# Patient Record
Sex: Female | Born: 1960
Health system: Southern US, Community
[De-identification: ages and names within clinical notes are randomized; demographics above are authoritative.]

## PROBLEM LIST (undated history)

## (undated) DIAGNOSIS — J45909 Unspecified asthma, uncomplicated: Secondary | ICD-10-CM

## (undated) DIAGNOSIS — R52 Pain, unspecified: Secondary | ICD-10-CM

## (undated) DIAGNOSIS — I639 Cerebral infarction, unspecified: Secondary | ICD-10-CM

## (undated) DIAGNOSIS — I10 Essential (primary) hypertension: Secondary | ICD-10-CM

## (undated) DIAGNOSIS — I2510 Atherosclerotic heart disease of native coronary artery without angina pectoris: Secondary | ICD-10-CM

## (undated) DIAGNOSIS — E119 Type 2 diabetes mellitus without complications: Secondary | ICD-10-CM

## (undated) DIAGNOSIS — F329 Major depressive disorder, single episode, unspecified: Secondary | ICD-10-CM

## (undated) DIAGNOSIS — K219 Gastro-esophageal reflux disease without esophagitis: Secondary | ICD-10-CM

## (undated) DIAGNOSIS — Z8719 Personal history of other diseases of the digestive system: Secondary | ICD-10-CM

## (undated) DIAGNOSIS — E041 Nontoxic single thyroid nodule: Secondary | ICD-10-CM

## (undated) DIAGNOSIS — F32A Depression, unspecified: Secondary | ICD-10-CM

## (undated) HISTORY — PX: EYE SURGERY: SHX253

## (undated) HISTORY — PX: ABDOMINAL HYSTERECTOMY: SHX81

## (undated) HISTORY — PX: CARDIAC SURGERY: SHX584

---

## 2005-04-28 ENCOUNTER — Ambulatory Visit: Payer: Self-pay | Admitting: Unknown Physician Specialty

## 2005-05-01 ENCOUNTER — Other Ambulatory Visit: Payer: Self-pay

## 2005-05-01 ENCOUNTER — Inpatient Hospital Stay: Payer: Self-pay | Admitting: Internal Medicine

## 2005-05-09 ENCOUNTER — Ambulatory Visit: Payer: Self-pay | Admitting: Unknown Physician Specialty

## 2005-06-09 ENCOUNTER — Emergency Department: Payer: Self-pay | Admitting: Internal Medicine

## 2005-09-15 ENCOUNTER — Other Ambulatory Visit: Payer: Self-pay

## 2005-09-15 ENCOUNTER — Emergency Department: Payer: Self-pay | Admitting: Emergency Medicine

## 2005-12-19 ENCOUNTER — Emergency Department: Payer: Self-pay | Admitting: Emergency Medicine

## 2006-04-20 ENCOUNTER — Emergency Department: Payer: Self-pay | Admitting: Emergency Medicine

## 2006-06-21 ENCOUNTER — Inpatient Hospital Stay: Payer: Self-pay | Admitting: Internal Medicine

## 2006-10-05 ENCOUNTER — Emergency Department: Payer: Self-pay | Admitting: Emergency Medicine

## 2007-03-22 ENCOUNTER — Emergency Department: Payer: Self-pay | Admitting: Emergency Medicine

## 2007-03-22 ENCOUNTER — Other Ambulatory Visit: Payer: Self-pay

## 2007-10-25 ENCOUNTER — Ambulatory Visit: Payer: Self-pay | Admitting: Family Medicine

## 2007-11-27 ENCOUNTER — Observation Stay: Payer: Self-pay | Admitting: Internal Medicine

## 2007-11-27 ENCOUNTER — Other Ambulatory Visit: Payer: Self-pay

## 2008-05-24 ENCOUNTER — Other Ambulatory Visit: Payer: Self-pay

## 2008-05-24 ENCOUNTER — Emergency Department: Payer: Self-pay | Admitting: Emergency Medicine

## 2008-09-10 ENCOUNTER — Ambulatory Visit: Payer: Self-pay

## 2009-04-13 ENCOUNTER — Inpatient Hospital Stay: Payer: Self-pay | Admitting: Internal Medicine

## 2009-06-04 ENCOUNTER — Ambulatory Visit: Payer: Self-pay | Admitting: Family Medicine

## 2010-02-25 ENCOUNTER — Emergency Department: Payer: Self-pay | Admitting: Emergency Medicine

## 2010-04-07 ENCOUNTER — Ambulatory Visit: Payer: Self-pay

## 2010-06-02 ENCOUNTER — Emergency Department: Payer: Self-pay | Admitting: Emergency Medicine

## 2010-07-01 ENCOUNTER — Emergency Department: Payer: Self-pay | Admitting: Emergency Medicine

## 2010-07-16 ENCOUNTER — Ambulatory Visit: Payer: Self-pay | Admitting: Internal Medicine

## 2011-04-20 ENCOUNTER — Inpatient Hospital Stay: Payer: Self-pay | Admitting: Internal Medicine

## 2011-04-21 DIAGNOSIS — I517 Cardiomegaly: Secondary | ICD-10-CM

## 2011-04-21 DIAGNOSIS — R079 Chest pain, unspecified: Secondary | ICD-10-CM

## 2011-05-16 ENCOUNTER — Emergency Department: Payer: Self-pay | Admitting: Emergency Medicine

## 2011-06-29 ENCOUNTER — Inpatient Hospital Stay: Payer: Self-pay | Admitting: Internal Medicine

## 2011-07-22 ENCOUNTER — Emergency Department: Payer: Self-pay | Admitting: *Deleted

## 2011-11-01 ENCOUNTER — Ambulatory Visit: Payer: Self-pay | Admitting: Family Medicine

## 2012-01-03 ENCOUNTER — Ambulatory Visit: Payer: Self-pay | Admitting: Family Medicine

## 2012-06-10 ENCOUNTER — Emergency Department: Payer: Self-pay | Admitting: *Deleted

## 2012-06-10 LAB — COMPREHENSIVE METABOLIC PANEL
Calcium, Total: 8.8 mg/dL (ref 8.5–10.1)
Chloride: 101 mmol/L (ref 98–107)
Co2: 29 mmol/L (ref 21–32)
Creatinine: 1.35 mg/dL — ABNORMAL HIGH (ref 0.60–1.30)
EGFR (African American): 53 — ABNORMAL LOW
EGFR (Non-African Amer.): 45 — ABNORMAL LOW
Glucose: 159 mg/dL — ABNORMAL HIGH (ref 65–99)
Osmolality: 281 (ref 275–301)
Potassium: 3.6 mmol/L (ref 3.5–5.1)
SGOT(AST): 25 U/L (ref 15–37)
Sodium: 140 mmol/L (ref 136–145)

## 2012-06-10 LAB — URINALYSIS, COMPLETE
Bilirubin,UR: NEGATIVE
Glucose,UR: NEGATIVE mg/dL (ref 0–75)
Specific Gravity: 1.004 (ref 1.003–1.030)
Squamous Epithelial: 4
WBC UR: 28 /HPF (ref 0–5)

## 2012-06-10 LAB — CBC WITH DIFFERENTIAL/PLATELET
Basophil #: 0 10*3/uL (ref 0.0–0.1)
Basophil %: 0.7 %
Eosinophil #: 0.1 10*3/uL (ref 0.0–0.7)
HCT: 38.5 % (ref 35.0–47.0)
HGB: 12.7 g/dL (ref 12.0–16.0)
Lymphocyte #: 2.1 10*3/uL (ref 1.0–3.6)
MCH: 26.7 pg (ref 26.0–34.0)
MCHC: 33 g/dL (ref 32.0–36.0)
Monocyte #: 0.4 x10 3/mm (ref 0.2–0.9)
Monocyte %: 7.1 %
RBC: 4.76 10*6/uL (ref 3.80–5.20)
RDW: 14 % (ref 11.5–14.5)

## 2012-06-10 LAB — LIPASE, BLOOD: Lipase: 128 U/L (ref 73–393)

## 2012-10-17 ENCOUNTER — Ambulatory Visit: Payer: Self-pay | Admitting: Ophthalmology

## 2012-10-17 DIAGNOSIS — I10 Essential (primary) hypertension: Secondary | ICD-10-CM

## 2012-10-17 LAB — HEMOGLOBIN: HGB: 12.2 g/dL

## 2012-10-17 LAB — POTASSIUM: Potassium: 3.6 mmol/L

## 2012-10-29 ENCOUNTER — Ambulatory Visit: Payer: Self-pay | Admitting: Ophthalmology

## 2013-02-08 ENCOUNTER — Ambulatory Visit: Payer: Self-pay | Admitting: Family Medicine

## 2013-09-13 DIAGNOSIS — R079 Chest pain, unspecified: Secondary | ICD-10-CM | POA: Insufficient documentation

## 2013-09-13 DIAGNOSIS — E782 Mixed hyperlipidemia: Secondary | ICD-10-CM | POA: Insufficient documentation

## 2013-09-13 DIAGNOSIS — K219 Gastro-esophageal reflux disease without esophagitis: Secondary | ICD-10-CM | POA: Insufficient documentation

## 2013-09-13 DIAGNOSIS — J45909 Unspecified asthma, uncomplicated: Secondary | ICD-10-CM | POA: Insufficient documentation

## 2013-09-13 DIAGNOSIS — I69359 Hemiplegia and hemiparesis following cerebral infarction affecting unspecified side: Secondary | ICD-10-CM | POA: Insufficient documentation

## 2013-09-13 HISTORY — DX: Chest pain, unspecified: R07.9

## 2014-02-20 DIAGNOSIS — D126 Benign neoplasm of colon, unspecified: Secondary | ICD-10-CM

## 2014-02-20 HISTORY — DX: Benign neoplasm of colon, unspecified: D12.6

## 2014-09-23 DIAGNOSIS — I10 Essential (primary) hypertension: Secondary | ICD-10-CM | POA: Insufficient documentation

## 2015-03-31 NOTE — Op Note (Signed)
PATIENT NAME:  Kayla Malone, Kayla Malone MR#:  [id number] DATE OF BIRTH:  07/01/1961  DATE OF PROCEDURE:  10/29/2012  PREOPERATIVE DIAGNOSIS: Visually significant cataract of the right eye.   POSTOPERATIVE DIAGNOSIS: Visually significant cataract of the right eye.   OPERATIVE PROCEDURE: Cataract extraction by phacoemulsification with implant of intraocular lens to the right eye.   SURGEON: Kayla Robson, MD  ANESTHESIA:  1. Managed anesthesia care.  2. 50-50 mixture of 0.75% bupivacaine and 4% Xylocaine given as a retrobulbar block.   COMPLICATIONS: None.   TECHNIQUE:  Stop-and-chop    DESCRIPTION OF PROCEDURE: The patient was examined and consented for this procedure in the preoperative holding area and then brought back to the Operating Room where the anesthesia team employed managed anesthesia care.  3.5 milliliters of the aforementioned mixture were placed in the right orbit on an Atkinson needle without complication. The right eye was then prepped and draped in the usual sterile ophthalmic fashion. A lid speculum was placed. The side-port blade was used to create a paracentesis and the anterior chamber was filled with viscoelastic. The keratome was used to create a near clear corneal incision. The continuous curvilinear capsulorrhexis was performed with a cystotome followed by the capsulorrhexis forceps. Hydrodissection and hydrodelineation were carried out with BSS on a blunt cannula. The lens was removed in a stop-and-chop technique. The remaining cortical material was removed with the irrigation-aspiration handpiece. The capsular bag was inflated with viscoelastic and the Tecnis ZCB00 24.0-diopter lens, serial number [id number] was placed in the capsular bag without complication. The remaining viscoelastic was removed from the eye with the irrigation-aspiration handpiece. The wounds were hydrated. The anterior chamber was flushed with Miostat. The eye was inflated to a physiologic pressure  and the wounds were found to be watertight.   The eye was dressed with Vigamox followed by Maxitrol ointment and a protective shield was placed. The patient will follow-up with me in one day.   ____________________________ Kayla Malone. Chanetta Moosman, MD wlp:drc D: 10/29/2012 17:33:21 ET T: 10/30/2012 08:20:05 ET JOB#: [id number]  cc: Romuald Mccaslin L. Carrah Eppolito, MD, <Dictator> Kayla Malone Lynda Capistran MD ELECTRONICALLY SIGNED 11/15/2012 15:43

## 2016-05-03 ENCOUNTER — Encounter: Payer: Self-pay | Admitting: *Deleted

## 2016-05-03 ENCOUNTER — Emergency Department: Payer: Medicare Other

## 2016-05-03 ENCOUNTER — Emergency Department
Admission: EM | Admit: 2016-05-03 | Discharge: 2016-05-03 | Disposition: A | Discharge status: Home / Self Care | Payer: Medicare Other | Attending: Emergency Medicine | Admitting: Emergency Medicine

## 2016-05-03 DIAGNOSIS — I10 Essential (primary) hypertension: Secondary | ICD-10-CM | POA: Insufficient documentation

## 2016-05-03 DIAGNOSIS — J45909 Unspecified asthma, uncomplicated: Secondary | ICD-10-CM | POA: Diagnosis not present

## 2016-05-03 DIAGNOSIS — E119 Type 2 diabetes mellitus without complications: Secondary | ICD-10-CM | POA: Diagnosis not present

## 2016-05-03 DIAGNOSIS — R1032 Left lower quadrant pain: Secondary | ICD-10-CM | POA: Insufficient documentation

## 2016-05-03 HISTORY — DX: Essential (primary) hypertension: I10

## 2016-05-03 HISTORY — DX: Type 2 diabetes mellitus without complications: E11.9

## 2016-05-03 HISTORY — DX: Unspecified asthma, uncomplicated: J45.909

## 2016-05-03 LAB — COMPREHENSIVE METABOLIC PANEL
ALBUMIN: 4.3 g/dL (ref 3.5–5.0)
ALT: 15 U/L (ref 14–54)
AST: 22 U/L (ref 15–41)
Alkaline Phosphatase: 91 U/L (ref 38–126)
Anion gap: 6 (ref 5–15)
BUN: 10 mg/dL (ref 6–20)
CHLORIDE: 103 mmol/L (ref 101–111)
CO2: 29 mmol/L (ref 22–32)
Calcium: 9.3 mg/dL (ref 8.9–10.3)
Creatinine, Ser: 0.89 mg/dL (ref 0.44–1.00)
GFR calc non Af Amer: >60 mL/min (ref >60)
Glucose, Bld: 114 mg/dL — ABNORMAL HIGH (ref 65–99)
Potassium: 3.6 mmol/L (ref 3.5–5.1)
Sodium: 138 mmol/L (ref 135–145)
Total Bilirubin: 0.5 mg/dL (ref 0.3–1.2)
Total Protein: 7.9 g/dL (ref 6.5–8.1)

## 2016-05-03 LAB — CBC
HEMATOCRIT: 40.2 % (ref 35.0–47.0)
HEMOGLOBIN: 13.1 g/dL (ref 12.0–16.0)
MCH: 27 pg (ref 26.0–34.0)
MCHC: 32.5 g/dL (ref 32.0–36.0)
MCV: 83.1 fL (ref 80.0–100.0)
Platelets: 324 10*3/uL (ref 150–440)
RBC: 4.84 MIL/uL (ref 3.80–5.20)
RDW: 13.9 % (ref 11.5–14.5)
WBC: 5.9 10*3/uL (ref 3.6–11.0)

## 2016-05-03 LAB — URINALYSIS COMPLETE WITH MICROSCOPIC (ARMC ONLY)
BACTERIA UA: NONE SEEN
Bilirubin Urine: NEGATIVE
Glucose, UA: NEGATIVE mg/dL
Ketones, ur: NEGATIVE mg/dL
Nitrite: NEGATIVE
PH: 5 (ref 5.0–8.0)
PROTEIN: NEGATIVE mg/dL
Specific Gravity, Urine: 1.011 (ref 1.005–1.030)

## 2016-05-03 LAB — LIPASE, BLOOD: Lipase: 42 U/L (ref 11–51)

## 2016-05-03 MED ORDER — DIATRIZOATE MEGLUMINE & SODIUM 66-10 % PO SOLN
15.0000 mL | Freq: Once | ORAL | Status: AC
  Administered 2016-05-03: 15 mL via ORAL

## 2016-05-03 MED ORDER — IOPAMIDOL (ISOVUE-300) INJECTION 61%
100.0000 mL | Freq: Once | INTRAVENOUS | Status: AC | PRN
  Administered 2016-05-03: 100 mL via INTRAVENOUS

## 2016-05-03 MED ORDER — HYDROCODONE-ACETAMINOPHEN 5-325 MG PO TABS: 1.0000 | ORAL_TABLET | ORAL | Status: DC | PRN

## 2016-05-03 MED ORDER — FENTANYL CITRATE (PF) 100 MCG/2ML IJ SOLN
50.0000 ug | Freq: Once | INTRAMUSCULAR | Status: AC
  Administered 2016-05-03: 50 ug via INTRAVENOUS
  Filled 2016-05-03: qty 2

## 2016-05-03 NOTE — Discharge Instructions (Signed)

## 2016-05-03 NOTE — ED Provider Notes (Addendum)
[facility] Emergency Department Provider Note  ____________________________________________   I have reviewed the triage vital signs and the nursing notes.   HISTORY  Chief Complaint Abdominal Pain    HPI Kayla Malone is a 55 y.o. female with a history of diabetes , denies surgical history of her abdomen, states that she has left lower quadrant pain for the last 2-3 days. No fever no vomiting no other symptoms. His never had diverticulitis per does have a history of kidney stones in the past but has had no hematuria dysuria or urinary frequency or flank pain. Pain began gradually. Nothing makes it better nothing makes it worse. There are no other associated symptoms. She has not had any prior treatment. Has not taken very much she states in the way of over-the-counter pain medication. Patient does state the tramadol made her sleepy once but she has never had an anaphylactic reaction to pain medications. She is driving but does request pain meds she understands that someone must come pick her up and she receives narcotic pain medication.     Past Medical History  Diagnosis Date  . Diabetes mellitus without complication ([place])   . Hypertension     There are no active problems to display for this patient.   History reviewed. No pertinent past surgical history.  No current outpatient prescriptions on file.  Allergies Tramadol  No family history on file.  Social History Social History  Substance Use Topics  . Smoking status: Never Smoker   . Smokeless tobacco: None  . Alcohol Use: No    Review of Systems Constitutional: No fever/chills Eyes: No visual changes. ENT: No sore throat. No stiff neck no neck pain Cardiovascular: Denies chest pain. Respiratory: Denies shortness of breath. Gastrointestinal:   no vomiting.  No diarrhea.  No constipation. Genitourinary: Negative for dysuria. Musculoskeletal: Negative lower extremity  swelling Skin: Negative for rash. Neurological: Negative for headaches, focal weakness or numbness. 10-point ROS otherwise negative.  ____________________________________________   PHYSICAL EXAM:  VITAL SIGNS: ED Triage Vitals  Enc Vitals Group     BP 05/03/16 1749 153/74 mmHg     Pulse Rate 05/03/16 1749 65     Resp 05/03/16 1749 20     Temp 05/03/16 1749 98.2 F (36.8 C)     Temp Source 05/03/16 1749 Oral     SpO2 05/03/16 1749 100 %     Weight 05/03/16 1749 150 lb (68.04 kg)     Height 05/03/16 1749 5\' 3"  (1.6 m)     Head Cir --      Peak Flow --      Pain Score 05/03/16 1749 8     Pain Loc --      Pain Edu? --      Excl. in [place]? --     Constitutional: Alert and oriented. Well appearing and in no acute distress. Eyes: Conjunctivae are normal. PERRL. EOMI. Head: Atraumatic. Nose: No congestion/rhinnorhea. Mouth/Throat: Mucous membranes are moist.  Oropharynx non-erythematous. Neck: No stridor.   Nontender with no meningismus Cardiovascular: Normal rate, regular rhythm. Grossly normal heart sounds.  Good peripheral circulation. Respiratory: Normal respiratory effort.  No retractions. Lungs CTAB. Abdominal: Soft Positive tenderness to palpation left lower quadrant voluntary guarding no rebound No distention.  Back:  There is no focal tenderness or step off there is no midline tenderness there are no lesions noted. there is no CVA tenderness Musculoskeletal: No lower extremity tenderness. No joint effusions, no DVT signs strong distal pulses  no edema Neurologic:  Normal speech and language. No gross focal neurologic deficits are appreciated.  Skin:  Skin is warm, dry and intact. No rash noted. Psychiatric: Mood and affect are normal. Speech and behavior are normal.  ____________________________________________   LABS (all labs ordered are listed, but only abnormal results are displayed)  Labs Reviewed  COMPREHENSIVE METABOLIC PANEL - Abnormal; Notable for the  following:    Glucose, Bld 114 (*)    All other components within normal limits  URINALYSIS COMPLETEWITH MICROSCOPIC ([facility] ONLY) - Abnormal; Notable for the following:    Color, Urine YELLOW (*)    APPearance CLEAR (*)    Hgb urine dipstick 2+ (*)    Leukocytes, UA TRACE (*)    Squamous Epithelial / LPF 0-5 (*)    All other components within normal limits  LIPASE, BLOOD  CBC   ____________________________________________  EKG  I personally interpreted any EKGs ordered by me or triage  ____________________________________________  RADIOLOGY  I reviewed any imaging ordered by me or triage that were performed during my shift and, if possible, patient and/or family made aware of any abnormal findings. ____________________________________________   PROCEDURES  Procedure(s) performed: None  Critical Care performed: None  ____________________________________________   INITIAL IMPRESSION / ASSESSMENT AND PLAN / ED COURSE  Pertinent labs & imaging results that were available during my care of the patient were reviewed by me and considered in my medical decision making (see chart for details).  Left lower quadrant pain concerning for possible diverticular pathology will obtain CT and reassess. Patient in no acute distress.  ----------------------------------------- 8:49 PM on 05/03/2016 -----------------------------------------  Signed out to Dr Kerman Passey at the end of my shift ____________________________________________   FINAL CLINICAL IMPRESSION(S) / ED DIAGNOSES  Final diagnoses:  None      This chart was dictated using voice recognition software.  Despite best efforts to proofread,  errors can occur which can change meaning.     Schuyler Amor, MD 05/03/16 [place], MD 05/03/16 [phone]

## 2016-05-03 NOTE — ED Provider Notes (Signed)
-----------------------------------------   9:53 PM on 05/03/2016 -----------------------------------------  Patient CT scan has resulted normal. No signs of diverticulitis. Labs including white blood cell count are negative. Urinalysis normal. We'll discharge the patient with a short course of pain medication and have her follow up with her primary care doctor is [place] [facility]. Discussed strict return precautions, the patient is agreeable.  Harvest Dark, MD 05/03/16 2154

## 2016-05-03 NOTE — ED Notes (Signed)
Pt complains of left lower quad pain below the umbilicus, pt also reports a sore throat

## 2016-05-20 ENCOUNTER — Emergency Department
Admission: EM | Admit: 2016-05-20 | Discharge: 2016-05-20 | Disposition: A | Discharge status: Home / Self Care | Payer: Medicare Other | Attending: Emergency Medicine | Admitting: Emergency Medicine

## 2016-05-20 ENCOUNTER — Emergency Department: Payer: Medicare Other

## 2016-05-20 DIAGNOSIS — R079 Chest pain, unspecified: Secondary | ICD-10-CM

## 2016-05-20 DIAGNOSIS — M79662 Pain in left lower leg: Secondary | ICD-10-CM | POA: Diagnosis present

## 2016-05-20 DIAGNOSIS — J45909 Unspecified asthma, uncomplicated: Secondary | ICD-10-CM | POA: Insufficient documentation

## 2016-05-20 DIAGNOSIS — R0789 Other chest pain: Secondary | ICD-10-CM | POA: Diagnosis not present

## 2016-05-20 DIAGNOSIS — M79605 Pain in left leg: Secondary | ICD-10-CM

## 2016-05-20 DIAGNOSIS — I10 Essential (primary) hypertension: Secondary | ICD-10-CM | POA: Insufficient documentation

## 2016-05-20 DIAGNOSIS — E119 Type 2 diabetes mellitus without complications: Secondary | ICD-10-CM | POA: Diagnosis not present

## 2016-05-20 DIAGNOSIS — Z8673 Personal history of transient ischemic attack (TIA), and cerebral infarction without residual deficits: Secondary | ICD-10-CM | POA: Insufficient documentation

## 2016-05-20 HISTORY — DX: Cerebral infarction, unspecified: I63.9

## 2016-05-20 LAB — CBC
HEMATOCRIT: 39.2 % (ref 35.0–47.0)
HEMOGLOBIN: 13.1 g/dL (ref 12.0–16.0)
MCH: 27 pg (ref 26.0–34.0)
MCHC: 33.4 g/dL (ref 32.0–36.0)
MCV: 81 fL (ref 80.0–100.0)
Platelets: 334 10*3/uL (ref 150–440)
RBC: 4.84 MIL/uL (ref 3.80–5.20)
RDW: 14.2 % (ref 11.5–14.5)
WBC: 5.4 10*3/uL (ref 3.6–11.0)

## 2016-05-20 LAB — COMPREHENSIVE METABOLIC PANEL
ALK PHOS: 82 U/L (ref 38–126)
ALT: 12 U/L — AB (ref 14–54)
AST: 18 U/L (ref 15–41)
Albumin: 4.2 g/dL (ref 3.5–5.0)
Anion gap: 7 (ref 5–15)
BILIRUBIN TOTAL: 0.9 mg/dL (ref 0.3–1.2)
BUN: 10 mg/dL (ref 6–20)
CALCIUM: 9 mg/dL (ref 8.9–10.3)
CO2: 27 mmol/L (ref 22–32)
CREATININE: 0.74 mg/dL (ref 0.44–1.00)
Chloride: 103 mmol/L (ref 101–111)
GFR calc Af Amer: >60 mL/min (ref >60)
Glucose, Bld: 161 mg/dL — ABNORMAL HIGH (ref 65–99)
Potassium: 3.8 mmol/L (ref 3.5–5.1)
Sodium: 137 mmol/L (ref 135–145)
TOTAL PROTEIN: 7.6 g/dL (ref 6.5–8.1)

## 2016-05-20 LAB — CK: Total CK: 82 U/L (ref 38–234)

## 2016-05-20 LAB — TROPONIN I: Troponin I: <0.03 ng/mL (ref <0.031)

## 2016-05-20 MED ORDER — OXYCODONE-ACETAMINOPHEN 5-325 MG PO TABS
1.0000 | ORAL_TABLET | Freq: Once | ORAL | Status: AC
  Administered 2016-05-20: 1 via ORAL
  Filled 2016-05-20: qty 1

## 2016-05-20 NOTE — ED Provider Notes (Signed)
[facility] Emergency Department Provider Note   ____________________________________________  Time seen: Approximately [id number] AM  I have reviewed the triage vital signs and the nursing notes.   HISTORY  Chief Complaint Leg Pain    HPI Kayla Malone is a 55 y.o. female who comes into the hospital today with some pains in her left leg. She reports that it started in her lower leg. She took some Tylenol and aspirin but the pain seemed to moving up her leg. She reports that she feels some tenderness. Shereports though that now she has some chest tightness. She reports that it started recently but she is unable to give her a specific time. She reports it is not hurting as much as it did when she first started. The patient reports that the leg pain woke her up out of sleep. She reports that the pain seems to come and go and is better with walking. The patient rates her pain 8 out of 10 in intensity   Past Medical History  Diagnosis Date  . Diabetes mellitus without complication ([place])   . Hypertension   . Asthma   . Stroke [facility])     x3    There are no active problems to display for this patient.   History reviewed. No pertinent past surgical history.  Current Outpatient Rx  Name  Route  Sig  Dispense  Refill  . HYDROcodone-acetaminophen (NORCO/VICODIN) 5-325 MG tablet   Oral   Take 1 tablet by mouth every 4 (four) hours as needed.   15 tablet   0     Allergies Tramadol  No family history on file.  Social History Social History  Substance Use Topics  . Smoking status: Never Smoker   . Smokeless tobacco: None  . Alcohol Use: No    Review of Systems Constitutional: No fever/chills Eyes: No visual changes. ENT: No sore throat. Cardiovascular: chest pain. Respiratory: Denies shortness of breath. Gastrointestinal: No abdominal pain.  No nausea, no vomiting.  No diarrhea.  No constipation. Genitourinary: Negative for  dysuria. Musculoskeletal: left leg pain. Skin: Negative for rash. Neurological: Negative for headaches, focal weakness or numbness.  10-point ROS otherwise negative.  ____________________________________________   PHYSICAL EXAM:  VITAL SIGNS: ED Triage Vitals  Enc Vitals Group     BP 05/20/16 0357 151/99 mmHg     Pulse Rate 05/20/16 0357 68     Resp 05/20/16 0357 18     Temp 05/20/16 0357 98.2 F (36.8 C)     Temp Source 05/20/16 0357 Oral     SpO2 05/20/16 0357 98 %     Weight 05/20/16 0357 145 lb (65.772 kg)     Height 05/20/16 0357 5\' 3"  (1.6 m)     Head Cir --      Peak Flow --      Pain Score 05/20/16 0358 8     Pain Loc --      Pain Edu? --      Excl. in [place]? --     Constitutional: Alert and oriented. Well appearing and in mild distress. Eyes: Conjunctivae are normal. PERRL. EOMI. Head: Atraumatic. Nose: No congestion/rhinnorhea. Mouth/Throat: Mucous membranes are moist.  Oropharynx non-erythematous. Cardiovascular: Normal rate, regular rhythm. Grossly normal heart sounds.  Good peripheral circulation. Respiratory: Normal respiratory effort.  No retractions. Lungs CTAB. Gastrointestinal: Soft and nontender. No distention. Positive bowel sounds Musculoskeletal: No lower extremity tenderness nor edema.   Neurologic:  Normal speech and language.  Skin:  Skin is warm, dry and intact. Psychiatric: Mood and affect are normal.   ____________________________________________   LABS (all labs ordered are listed, but only abnormal results are displayed)  Labs Reviewed  COMPREHENSIVE METABOLIC PANEL - Abnormal; Notable for the following:    Glucose, Bld 161 (*)    ALT 12 (*)    All other components within normal limits  CBC  TROPONIN I  CK  TROPONIN I   ____________________________________________  EKG  ED ECG REPORT I, Loney Hering, the attending physician, personally viewed and interpreted this ECG.   Date: 05/20/2016  EKG Time: 615  Rate: 66   Rhythm: normal sinus rhythm  Axis: normal  Intervals:none  ST&T Change: none  ____________________________________________  RADIOLOGY  CXR: No active cardiopulmonary disease  [place] LLE doppler: No evidence of left lower extremity deep venous thrombosis ____________________________________________   PROCEDURES  Procedure(s) performed: None  Critical Care performed: No  ____________________________________________   INITIAL IMPRESSION / ASSESSMENT AND PLAN / ED COURSE  Pertinent labs & imaging results that were available during my care of the patient were reviewed by me and considered in my medical decision making (see chart for details).  This is a 55 year old female who comes into the hospital today with some leg pain and some chest pain. The patient's left lower extremity does not have a DVT at this time. She is complaining of some chest pain so we will evaluate her chest pain and reassess the patient. The patient's care will be signed out to Dr. Burlene Arnt who will follow up the results of the repeat troponin. The patient will receive a dose of percocet for her pain.  ____________________________________________   FINAL CLINICAL IMPRESSION(S) / ED DIAGNOSES  Final diagnoses:  Pain of left lower extremity  Chest pain, unspecified chest pain type      NEW MEDICATIONS STARTED DURING THIS VISIT:  New Prescriptions   No medications on file     Note:  This document was prepared using Dragon voice recognition software and may include unintentional dictation errors.    Loney Hering, MD 05/20/16 [phone]

## 2016-05-20 NOTE — ED Notes (Addendum)
Pt uprite on stretcher in exam room with no distress noted; pt denies pain at present, st "just a tightness" indicating upper chest; st hx of same but never checked for such; st tightness began with onset of left leg pain that woke her up around 1-2 a.m.; Patient states pain is intermittent but better when walking; Pt to xray via stretcher accomp by xray tech; resp even/unlab, lungs clear; apical audible & regular; +BS, abd soft/nondist; +PP,. -edema; card monitor in place

## 2016-05-20 NOTE — ED Provider Notes (Signed)
-----------------------------------------   11:17 AM on 05/20/2016 -----------------------------------------  Signed out to me by Dr. Dahlia Client at 9:30 this morning awaiting second troponin. Patient had extensive workup by Dr. Dahlia Client, and according to her sign out if troponin is negative patient is to be discharged and discharge paperwork is ready.. According to her plan and discharge instructions, if troponin was negative patient was to be discharged. Patient has no complaint of chest pressures of breath or ongoing discomfort of any variety, she is eager to go home. Extensor return precautions and follow-up given and understood. Vital signs are reassuring.  Schuyler Amor, MD 05/20/16 1118

## 2016-05-20 NOTE — ED Notes (Signed)
Patient to ED via POV for left leg pain. States it woke her up around 1-2 a.m. This morning. Patient states pain is intermittent but better when walking. Took an extra blood pressure pill because her pressure was up. Skin warm/dry, normal temperature, normal color for ethnicity.

## 2016-09-11 ENCOUNTER — Encounter: Payer: Self-pay | Admitting: Emergency Medicine

## 2016-09-11 ENCOUNTER — Emergency Department: Payer: Medicare Other

## 2016-09-11 ENCOUNTER — Emergency Department
Admission: EM | Admit: 2016-09-11 | Discharge: 2016-09-11 | Disposition: A | Discharge status: Home / Self Care | Payer: Medicare Other | Attending: Emergency Medicine | Admitting: Emergency Medicine

## 2016-09-11 DIAGNOSIS — M5431 Sciatica, right side: Secondary | ICD-10-CM | POA: Diagnosis not present

## 2016-09-11 DIAGNOSIS — Y999 Unspecified external cause status: Secondary | ICD-10-CM | POA: Diagnosis not present

## 2016-09-11 DIAGNOSIS — X58XXXA Exposure to other specified factors, initial encounter: Secondary | ICD-10-CM | POA: Insufficient documentation

## 2016-09-11 DIAGNOSIS — I10 Essential (primary) hypertension: Secondary | ICD-10-CM | POA: Insufficient documentation

## 2016-09-11 DIAGNOSIS — S39012A Strain of muscle, fascia and tendon of lower back, initial encounter: Secondary | ICD-10-CM | POA: Diagnosis not present

## 2016-09-11 DIAGNOSIS — J45909 Unspecified asthma, uncomplicated: Secondary | ICD-10-CM | POA: Insufficient documentation

## 2016-09-11 DIAGNOSIS — S3992XA Unspecified injury of lower back, initial encounter: Secondary | ICD-10-CM | POA: Diagnosis present

## 2016-09-11 DIAGNOSIS — Y929 Unspecified place or not applicable: Secondary | ICD-10-CM | POA: Diagnosis not present

## 2016-09-11 DIAGNOSIS — E119 Type 2 diabetes mellitus without complications: Secondary | ICD-10-CM | POA: Diagnosis not present

## 2016-09-11 DIAGNOSIS — Z791 Long term (current) use of non-steroidal anti-inflammatories (NSAID): Secondary | ICD-10-CM | POA: Diagnosis not present

## 2016-09-11 DIAGNOSIS — Y939 Activity, unspecified: Secondary | ICD-10-CM | POA: Insufficient documentation

## 2016-09-11 MED ORDER — MELOXICAM 15 MG PO TABS: 15.0000 mg | ORAL_TABLET | Freq: Every day | ORAL | 0 refills | Status: DC

## 2016-09-11 MED ORDER — ORPHENADRINE CITRATE 30 MG/ML IJ SOLN
60.0000 mg | Freq: Once | INTRAMUSCULAR | Status: AC
  Administered 2016-09-11: 60 mg via INTRAMUSCULAR
  Filled 2016-09-11: qty 2

## 2016-09-11 MED ORDER — METHOCARBAMOL 500 MG PO TABS: 500.0000 mg | ORAL_TABLET | Freq: Four times a day (QID) | ORAL | 0 refills | Status: DC

## 2016-09-11 MED ORDER — KETOROLAC TROMETHAMINE 30 MG/ML IJ SOLN
30.0000 mg | Freq: Once | INTRAMUSCULAR | Status: AC
  Administered 2016-09-11: 30 mg via INTRAMUSCULAR
  Filled 2016-09-11: qty 1

## 2016-09-11 NOTE — ED Notes (Signed)
Patient tolerated IM injections without incident. Patient made aware of 20 minute med hold.

## 2016-09-11 NOTE — ED Triage Notes (Signed)
Denies injury. Rt hip pain.

## 2016-09-11 NOTE — ED Provider Notes (Signed)
[facility] Emergency Department Provider Note  ____________________________________________  Time seen: Approximately 6:59 PM  I have reviewed the triage vital signs and the nursing notes.   HISTORY  Chief Complaint Hip Pain    HPI Kayla Malone is a 55 y.o. female who presents to emergency department complaining of right posterior hip pain. She states that the pain radiates down her right leg. No known injury. Patient states that the pain is sharp, does have some tingling down her leg. No numbness or loss of range of motion to right hip. No bowel or bladder dysfunction, saddle anesthesia, paresthesias.   Past Medical History:  Diagnosis Date  . Asthma   . Diabetes mellitus without complication ([place])   . Hypertension   . Stroke [facility])    x3    There are no active problems to display for this patient.   History reviewed. No pertinent surgical history.  Prior to Admission medications   Medication Sig Start Date End Date Taking? Authorizing Provider  HYDROcodone-acetaminophen (NORCO/VICODIN) 5-325 MG tablet Take 1 tablet by mouth every 4 (four) hours as needed. 05/03/16   Harvest Dark, MD  meloxicam (MOBIC) 15 MG tablet Take 1 tablet (15 mg total) by mouth daily. 09/11/16   Charline Bills Symphanie Cederberg, PA-C  methocarbamol (ROBAXIN) 500 MG tablet Take 1 tablet (500 mg total) by mouth 4 (four) times daily. 09/11/16   Charline Bills Kadasia Kassing, PA-C    Allergies Tramadol  History reviewed. No pertinent family history.  Social History Social History  Substance Use Topics  . Smoking status: Never Smoker  . Smokeless tobacco: Never Used  . Alcohol use No     Review of Systems  Constitutional: No fever/chills Cardiovascular: no chest pain. Respiratory: no cough. No SOB. Gastrointestinal: No abdominal pain.  No nausea, no vomiting.  No diarrhea.  No constipation. Genitourinary: Negative for dysuria. No hematuria Musculoskeletal: Positive  for right hip pain Skin: Negative for rash, abrasions, lacerations, ecchymosis. Neurological: Negative for headaches, focal weakness or numbness. 10-point ROS otherwise negative.  ____________________________________________   PHYSICAL EXAM:  VITAL SIGNS: ED Triage Vitals  Enc Vitals Group     BP 09/11/16 1839 (!) 151/75     Pulse Rate 09/11/16 1839 75     Resp 09/11/16 1839 16     Temp 09/11/16 1839 98.6 F (37 C)     Temp Source 09/11/16 1839 Oral     SpO2 09/11/16 1839 100 %     Weight 09/11/16 1840 160 lb (72.6 kg)     Height 09/11/16 1840 5\' 3"  (1.6 m)     Head Circumference --      Peak Flow --      Pain Score 09/11/16 1844 10     Pain Loc --      Pain Edu? --      Excl. in [place]? --      Constitutional: Alert and oriented. Well appearing and in no acute distress. Eyes: Conjunctivae are normal. PERRL. EOMI. Head: Atraumatic. Cardiovascular: Normal rate, regular rhythm. Normal S1 and S2.  Good peripheral circulation. Respiratory: Normal respiratory effort without tachypnea or retractions. Lungs CTAB. Good air entry to the bases with no decreased or absent breath sounds. Gastrointestinal: Bowel sounds 4 quadrants. Soft and nontender to palpation. No guarding or rigidity. No palpable masses. No distention. No CVA tenderness. Musculoskeletal: Full range of motion to all extremities. No gross deformities appreciated.No 40s to spine upon inspection. Patient is moderately tender to palpation in the paraspinal muscle  group on the left. All spasms are appreciated. Patient is tender to palpation over sciatic notch. Negative straight leg raise bilaterally. Dorsalis pedis pulse intact and equal lower extremities. Sensation intact and equal lower extremities. Neurologic:  Normal speech and language. No gross focal neurologic deficits are appreciated.  Skin:  Skin is warm, dry and intact. No rash noted. Psychiatric: Mood and affect are normal. Speech and behavior are normal. Patient  exhibits appropriate insight and judgement.   ____________________________________________   LABS (all labs ordered are listed, but only abnormal results are displayed)  Labs Reviewed - No data to display ____________________________________________  EKG   ____________________________________________  RADIOLOGY Diamantina [facility] Charli Liberatore, personally viewed and evaluated these images (plain radiographs) as part of my medical decision making, as well as reviewing the written report by the radiologist.  Dg Lumbar Spine Complete  Result Date: 09/11/2016 CLINICAL DATA:  Acute lower back pain without known injury. EXAM: LUMBAR SPINE - COMPLETE 4+ VIEW COMPARISON:  CT scan of May 03, 2016. FINDINGS: There is no evidence of lumbar spine fracture. Alignment is normal. Intervertebral disc spaces are maintained. Atherosclerosis of abdominal aorta is noted. IMPRESSION: Normal lumbar spine.  Aortic atherosclerosis. Electronically Signed   By: Marijo Conception, M.D.   On: 09/11/2016 19:30   Dg Hip Unilat W Or Wo Pelvis 2-3 Views Right  Result Date: 09/11/2016 CLINICAL DATA:  Acute right hip pain without known injury. EXAM: DG HIP (WITH OR WITHOUT PELVIS) 2-3V RIGHT COMPARISON:  None. FINDINGS: There is no evidence of hip fracture or dislocation. There is no evidence of arthropathy or other focal bone abnormality. IMPRESSION: Normal right hip. Electronically Signed   By: Marijo Conception, M.D.   On: 09/11/2016 20:20    ____________________________________________    PROCEDURES  Procedure(s) performed:    Procedures    Medications  ketorolac (TORADOL) 30 MG/ML injection 30 mg (not administered)  orphenadrine (NORFLEX) injection 60 mg (not administered)     ____________________________________________   INITIAL IMPRESSION / ASSESSMENT AND PLAN / ED COURSE  Pertinent labs & imaging results that were available during my care of the patient were reviewed by me and considered in my medical  decision making (see chart for details).  Review of the [place] CSRS was performed in accordance of the [place] prior to dispensing any controlled drugs.  Clinical Course    Patient's diagnosis is consistent with Paraspinal muscle strain and sciatica. No known injury. X-rays reveal no acute osseous abdomen benign. Exam is reassuring with patient. Neurologically intact distally. Patient is given Toradol and Norflex injection and emergency department. Patient will be discharged home with prescriptions for anti-inflammatory and muscle relaxer for symptom control. Patient is to follow up with primary care as needed or otherwise directed. Patient is given ED precautions to return to the ED for any worsening or new symptoms.     ____________________________________________  FINAL CLINICAL IMPRESSION(S) / ED DIAGNOSES  Final diagnoses:  Sciatica of right side  Strain of lumbar region, initial encounter      NEW MEDICATIONS STARTED DURING THIS VISIT:  New Prescriptions   MELOXICAM (MOBIC) 15 MG TABLET    Take 1 tablet (15 mg total) by mouth daily.   METHOCARBAMOL (ROBAXIN) 500 MG TABLET    Take 1 tablet (500 mg total) by mouth 4 (four) times daily.        This chart was dictated using voice recognition software/Dragon. Despite best efforts to proofread, errors can occur which can change the meaning. Any change  was purely unintentional.    Darletta Moll, PA-C 09/11/16 2041    Orbie Pyo, MD 09/12/16 [phone]

## 2016-11-05 ENCOUNTER — Emergency Department
Admission: EM | Admit: 2016-11-05 | Discharge: 2016-11-05 | Disposition: A | Discharge status: Home / Self Care | Payer: Medicare Other | Attending: Student in an Organized Health Care Education/Training Program | Admitting: Student in an Organized Health Care Education/Training Program

## 2016-11-05 ENCOUNTER — Encounter: Payer: Self-pay | Admitting: Emergency Medicine

## 2016-11-05 DIAGNOSIS — I10 Essential (primary) hypertension: Secondary | ICD-10-CM | POA: Insufficient documentation

## 2016-11-05 DIAGNOSIS — W19XXXA Unspecified fall, initial encounter: Secondary | ICD-10-CM | POA: Diagnosis not present

## 2016-11-05 DIAGNOSIS — Y939 Activity, unspecified: Secondary | ICD-10-CM | POA: Insufficient documentation

## 2016-11-05 DIAGNOSIS — Y999 Unspecified external cause status: Secondary | ICD-10-CM | POA: Insufficient documentation

## 2016-11-05 DIAGNOSIS — S299XXA Unspecified injury of thorax, initial encounter: Secondary | ICD-10-CM | POA: Diagnosis present

## 2016-11-05 DIAGNOSIS — J45909 Unspecified asthma, uncomplicated: Secondary | ICD-10-CM | POA: Insufficient documentation

## 2016-11-05 DIAGNOSIS — S233XXA Sprain of ligaments of thoracic spine, initial encounter: Secondary | ICD-10-CM | POA: Diagnosis not present

## 2016-11-05 DIAGNOSIS — E119 Type 2 diabetes mellitus without complications: Secondary | ICD-10-CM | POA: Diagnosis not present

## 2016-11-05 DIAGNOSIS — Y929 Unspecified place or not applicable: Secondary | ICD-10-CM | POA: Insufficient documentation

## 2016-11-05 DIAGNOSIS — M5431 Sciatica, right side: Secondary | ICD-10-CM

## 2016-11-05 DIAGNOSIS — S239XXA Sprain of unspecified parts of thorax, initial encounter: Secondary | ICD-10-CM

## 2016-11-05 MED ORDER — NABUMETONE 750 MG PO TABS: 750.0000 mg | ORAL_TABLET | Freq: Two times a day (BID) | ORAL | 0 refills | Status: DC

## 2016-11-05 MED ORDER — CYCLOBENZAPRINE HCL 5 MG PO TABS: 5.0000 mg | ORAL_TABLET | Freq: Three times a day (TID) | ORAL | 0 refills | Status: DC | PRN

## 2016-11-05 MED ORDER — KETOROLAC TROMETHAMINE 60 MG/2ML IM SOLN
30.0000 mg | Freq: Once | INTRAMUSCULAR | Status: AC
  Administered 2016-11-05: 30 mg via INTRAMUSCULAR
  Filled 2016-11-05: qty 2

## 2016-11-05 MED ORDER — ORPHENADRINE CITRATE 30 MG/ML IJ SOLN
60.0000 mg | INTRAMUSCULAR | Status: AC
  Administered 2016-11-05: 60 mg via INTRAMUSCULAR
  Filled 2016-11-05: qty 2

## 2016-11-05 NOTE — ED Provider Notes (Signed)
[facility] Emergency Department Provider Note ____________________________________________  Time seen: 1250  I have reviewed the triage vital signs and the nursing notes.  HISTORY  Chief Complaint  Back Pain  HPI Kayla Malone is a 55 y.o. female visits to the ED for evaluation of low back pain with radiation of the right lower leg. She also reports some mid back spasm to the right side. She describes a recent mechanical fall 4 days prior. She has a history of CVA with left sided weakness but denies any new paresthesias, motor weakness, or foot drop. She is without incontinence of bladder or bowel, or saddle paresthesias. She has a previous evaluation for some right-sided sciatic irritation, and notes resolution following treatment. She has not sought care for ongoing symptoms since her last visit.   Past Medical History:  Diagnosis Date  . Asthma   . Diabetes mellitus without complication ([name])   . Hypertension   . Stroke [facility])    x3    There are no active problems to display for this patient.  History reviewed. No pertinent surgical history.  Prior to Admission medications   Medication Sig Start Date End Date Taking? Authorizing Provider  cyclobenzaprine (FLEXERIL) 5 MG tablet Take 1 tablet (5 mg total) by mouth 3 (three) times daily as needed for muscle spasms. 11/05/16   Ritika Hellickson V Bacon Arjan Strohm, PA-C  HYDROcodone-acetaminophen (NORCO/VICODIN) 5-325 MG tablet Take 1 tablet by mouth every 4 (four) hours as needed. 05/03/16   Harvest Dark, MD  meloxicam (MOBIC) 15 MG tablet Take 1 tablet (15 mg total) by mouth daily. 09/11/16   Charline Bills Cuthriell, PA-C  methocarbamol (ROBAXIN) 500 MG tablet Take 1 tablet (500 mg total) by mouth 4 (four) times daily. 09/11/16   Charline Bills Cuthriell, PA-C  nabumetone (RELAFEN) 750 MG tablet Take 1 tablet (750 mg total) by mouth 2 (two) times daily. 11/05/16   Author [place] V Bacon Haneefah Venturini, PA-C     Allergies Tramadol  No family history on file.  Social History Social History  Substance Use Topics  . Smoking status: Never Smoker  . Smokeless tobacco: Never Used  . Alcohol use No    Review of Systems  Constitutional: Negative for fever. Cardiovascular: Negative for chest pain. Respiratory: Negative for shortness of breath. Gastrointestinal: Negative for abdominal pain, vomiting and diarrhea. Genitourinary: Negative for dysuria. Musculoskeletal: Positive for back pain. Skin: Negative for rash. Neurological: Negative for headaches, focal weakness or numbness. ____________________________________________  PHYSICAL EXAM:  VITAL SIGNS: ED Triage Vitals  Enc Vitals Group     BP 11/05/16 1205 (!) 145/67     Pulse Rate 11/05/16 1205 73     Resp 11/05/16 1205 18     Temp 11/05/16 1205 98.4 F (36.9 C)     Temp Source 11/05/16 1205 Oral     SpO2 11/05/16 1205 98 %     Weight 11/05/16 1206 167 lb (75.8 kg)     Height 11/05/16 1206 5\' 4"  (1.626 m)     Head Circumference --      Peak Flow --      Pain Score 11/05/16 1206 10     Pain Loc --      Pain Edu? --      Excl. in [place]? --    Constitutional: Alert and oriented. Well appearing and in no distress. Head: Normocephalic and atraumatic. Eyes: Conjunctivae are normal. PERRL. Normal extraocular movements Cardiovascular: Normal rate, regular rhythm. Normal distal pulses. Respiratory: Normal respiratory effort. No  wheezes/rales/rhonchi. Gastrointestinal: Soft and nontender. No distention. Musculoskeletal: Normal spinal alignment without tenderness, spasm, deformity, or step-off. Patient withpalpation over the right SI joint. She also has some tenderness to palpation over the posterior right hamstring into the posterior aspect of the knee. Her knee exam is otherwise benign. Negative supine straight leg raise. Normal hip flexion and rotational range. She transitions from supine to sit without assistance. Patient has some  tenderness to palpation to the right scapula thoracic region. Normal right upper extremities range of motion on exam rotator cuff deficit is noted. Nontender with normal range of motion in all extremities.  Neurologic:  Normal sensation. Normal LE DTRs bilaterally. Normal speech and language. No gross focal neurologic deficits are appreciated. Skin:  Skin is warm, dry and intact. No rash noted. ____________________________________________  PROCEDURES  Toradol 30 mg IM Norflex 60 mg IM ____________________________________________  INITIAL IMPRESSION / ASSESSMENT AND PLAN / ED COURSE  Patient with an acute thoracic spine strain following a mechanical fall. She also has some right-sided sciatic irritation. Her exam is otherwise benign. Acute neuromuscular deficit. She is discharged with symptoms improved following IM medication administration. She'll be provided with a prescription for cyclobenzaprine and nambutone for symptom relief. Return precautions are reviewed. She'll follow with her provider or Dr. Sabra Heck for ongoing symptom management.  Clinical Course    ____________________________________________  FINAL CLINICAL IMPRESSION(S) / ED DIAGNOSES  Final diagnoses:  Sciatica of right side  Thoracic back sprain, initial encounter      Melvenia Needles, PA-C 11/05/16 [place], MD 11/06/16 [phone]

## 2016-11-05 NOTE — Discharge Instructions (Signed)
Your exam seems to suggest an irritation of the sciatic nerve on the left leg. Take the prescription meds as directed. Rest with the leg elevated when seated. Follow-up with your provider or Dr. Sabra Heck for continued symptoms.

## 2016-11-05 NOTE — ED Triage Notes (Addendum)
Back pain x 1 week radiating down R leg. Denies fall or injury at onset. States did have a fall 4 days ago.

## 2016-11-09 ENCOUNTER — Other Ambulatory Visit: Payer: Self-pay | Admitting: Otolaryngology

## 2016-11-09 DIAGNOSIS — R430 Anosmia: Secondary | ICD-10-CM

## 2016-11-19 ENCOUNTER — Ambulatory Visit: Payer: Medicare Other

## 2017-02-14 ENCOUNTER — Emergency Department: Payer: Medicare Other

## 2017-02-14 ENCOUNTER — Emergency Department
Admission: EM | Admit: 2017-02-14 | Discharge: 2017-02-14 | Disposition: A | Discharge status: Home / Self Care | Payer: Medicare Other | Attending: Emergency Medicine | Admitting: Emergency Medicine

## 2017-02-14 ENCOUNTER — Encounter: Payer: Self-pay | Admitting: Emergency Medicine

## 2017-02-14 DIAGNOSIS — R1032 Left lower quadrant pain: Secondary | ICD-10-CM | POA: Diagnosis present

## 2017-02-14 DIAGNOSIS — E119 Type 2 diabetes mellitus without complications: Secondary | ICD-10-CM | POA: Insufficient documentation

## 2017-02-14 DIAGNOSIS — J45909 Unspecified asthma, uncomplicated: Secondary | ICD-10-CM | POA: Diagnosis not present

## 2017-02-14 DIAGNOSIS — M545 Low back pain, unspecified: Secondary | ICD-10-CM

## 2017-02-14 DIAGNOSIS — I10 Essential (primary) hypertension: Secondary | ICD-10-CM | POA: Insufficient documentation

## 2017-02-14 LAB — BASIC METABOLIC PANEL
Anion gap: 10 (ref 5–15)
BUN: 10 mg/dL (ref 6–20)
CALCIUM: 9.8 mg/dL (ref 8.9–10.3)
CO2: 26 mmol/L (ref 22–32)
CREATININE: 0.64 mg/dL (ref 0.44–1.00)
Chloride: 102 mmol/L (ref 101–111)
GFR calc non Af Amer: >60 mL/min (ref >60)
Glucose, Bld: 149 mg/dL — ABNORMAL HIGH (ref 65–99)
Potassium: 3.8 mmol/L (ref 3.5–5.1)
SODIUM: 138 mmol/L (ref 135–145)

## 2017-02-14 LAB — URINALYSIS, COMPLETE (UACMP) WITH MICROSCOPIC
BILIRUBIN URINE: NEGATIVE
Bacteria, UA: NONE SEEN
GLUCOSE, UA: NEGATIVE mg/dL
KETONES UR: NEGATIVE mg/dL
Nitrite: NEGATIVE
PROTEIN: NEGATIVE mg/dL
Specific Gravity, Urine: 1.01 (ref 1.005–1.030)
pH: 7 (ref 5.0–8.0)

## 2017-02-14 LAB — CBC
HCT: 45.2 % (ref 35.0–47.0)
Hemoglobin: 15 g/dL (ref 12.0–16.0)
MCH: 27.5 pg (ref 26.0–34.0)
MCHC: 33.1 g/dL (ref 32.0–36.0)
MCV: 83 fL (ref 80.0–100.0)
Platelets: 359 10*3/uL (ref 150–440)
RBC: 5.45 MIL/uL — ABNORMAL HIGH (ref 3.80–5.20)
RDW: 13.6 % (ref 11.5–14.5)
WBC: 5.8 10*3/uL (ref 3.6–11.0)

## 2017-02-14 MED ORDER — IOPAMIDOL (ISOVUE-300) INJECTION 61%
100.0000 mL | Freq: Once | INTRAVENOUS | Status: AC | PRN
  Administered 2017-02-14: 100 mL via INTRAVENOUS
  Filled 2017-02-14: qty 100

## 2017-02-14 MED ORDER — MORPHINE SULFATE (PF) 4 MG/ML IV SOLN
4.0000 mg | Freq: Once | INTRAVENOUS | Status: AC
  Administered 2017-02-14: 4 mg via INTRAVENOUS
  Filled 2017-02-14: qty 1

## 2017-02-14 MED ORDER — FENTANYL CITRATE (PF) 100 MCG/2ML IJ SOLN
50.0000 ug | INTRAMUSCULAR | Status: DC | PRN
  Administered 2017-02-14: 50 ug via INTRAVENOUS
  Filled 2017-02-14: qty 2

## 2017-02-14 MED ORDER — IOPAMIDOL (ISOVUE-300) INJECTION 61%
30.0000 mL | Freq: Once | INTRAVENOUS | Status: AC | PRN
  Administered 2017-02-14: 30 mL via ORAL
  Filled 2017-02-14: qty 30

## 2017-02-14 MED ORDER — HYDROCODONE-ACETAMINOPHEN 5-325 MG PO TABS: 1.0000 | ORAL_TABLET | ORAL | 0 refills | Status: DC | PRN

## 2017-02-14 MED ORDER — ONDANSETRON HCL 4 MG/2ML IJ SOLN
4.0000 mg | Freq: Once | INTRAMUSCULAR | Status: AC
Start: 2017-02-14 — End: 2017-02-14
  Administered 2017-02-14: 4 mg via INTRAVENOUS
  Filled 2017-02-14: qty 2

## 2017-02-14 MED ORDER — IBUPROFEN 600 MG PO TABS: 600.0000 mg | ORAL_TABLET | Freq: Four times a day (QID) | ORAL | 0 refills | Status: DC | PRN

## 2017-02-14 NOTE — ED Provider Notes (Signed)
[facility] Emergency Department Provider Note  Time seen: 2:47 PM  I have reviewed the triage vital signs and the nursing notes.   HISTORY  Chief Complaint Abdominal Pain    HPI Kayla Malone is a 56 y.o. female  with a past medical history of asthma, diabetes, hypertension, presents to the emergency department left lower quadrant abdominal pain. According to the patient since last night she has been experiencing left lower quadrant abdominal pain. She states the pain is severe, sharp in nature with nausea but denies vomiting. Does state loose stool 2 days ago, denies any black or bloody stool. Denies any dysuria or hematuria. Denies any vaginal bleeding or discharge. Denies any worsening with food.  Past Medical History:  Diagnosis Date  . Asthma   . Diabetes mellitus without complication ([place])   . Hypertension   . Stroke [facility])    x3    There are no active problems to display for this patient.   History reviewed. No pertinent surgical history.  Prior to Admission medications   Medication Sig Start Date End Date Taking? Authorizing Provider  cyclobenzaprine (FLEXERIL) 5 MG tablet Take 1 tablet (5 mg total) by mouth 3 (three) times daily as needed for muscle spasms. 11/05/16   Jenise V Bacon Menshew, PA-C  HYDROcodone-acetaminophen (NORCO/VICODIN) 5-325 MG tablet Take 1 tablet by mouth every 4 (four) hours as needed. 05/03/16   Harvest Dark, MD  meloxicam (MOBIC) 15 MG tablet Take 1 tablet (15 mg total) by mouth daily. 09/11/16   Charline Bills Cuthriell, PA-C  methocarbamol (ROBAXIN) 500 MG tablet Take 1 tablet (500 mg total) by mouth 4 (four) times daily. 09/11/16   Charline Bills Cuthriell, PA-C  nabumetone (RELAFEN) 750 MG tablet Take 1 tablet (750 mg total) by mouth 2 (two) times daily. 11/05/16   Jenise V Bacon Menshew, PA-C    Allergies  Allergen Reactions  . Tramadol Other (See Comments)    sleepiness    History reviewed. No  pertinent family history.  Social History Social History  Substance Use Topics  . Smoking status: Never Smoker  . Smokeless tobacco: Never Used  . Alcohol use No    Review of Systems Constitutional: Negative for fever. Cardiovascular: Negative for chest pain. Respiratory: Negative for shortness of breath. Gastrointestinal: Positive for left lower quadrant abdominal pain. Negative for vomiting, positive for loose stool. Genitourinary: Negative for dysuria. Negative for hematuria. Negative for vaginal bleeding or discharge. Musculoskeletal: Left lower back pain radiating around to the left lower quadrant of the abdomen. Neurological: Negative for headache 10-point ROS otherwise negative.  ____________________________________________   PHYSICAL EXAM:  VITAL SIGNS: ED Triage Vitals [02/14/17 1328]  Enc Vitals Group     BP (!) 152/109     Pulse Rate 73     Resp 18     Temp 98.1 F (36.7 C)     Temp Source Oral     SpO2 99 %     Weight 160 lb (72.6 kg)     Height 5\' 3"  (1.6 m)     Head Circumference      Peak Flow      Pain Score 10     Pain Loc      Pain Edu?      Excl. in [place]?     Constitutional: Alert and oriented. Well appearing and in no distress. Eyes: Normal exam ENT   Head: Normocephalic and atraumatic.   Mouth/Throat: Mucous membranes are moist. Cardiovascular: Normal rate, regular  rhythm. No murmur Respiratory: Normal respiratory effort without tachypnea nor retractions. Breath sounds are clear Gastrointestinal: Soft, mild left lower quadrant tenderness palpation. No rebound or guarding. No distention. No CVA tenderness. Musculoskeletal: Nontender with normal range of motion in all extremities.  Neurologic:  Normal speech and language. No gross focal neurologic deficits Skin:  Skin is warm, dry and intact.  Psychiatric: Mood and affect are normal.   ____________________________________________     RADIOLOGY  CT  negative  ____________________________________________   INITIAL IMPRESSION / ASSESSMENT AND PLAN / ED COURSE  Pertinent labs & imaging results that were available during my care of the patient were reviewed by me and considered in my medical decision making (see chart for details).  Patient presents to the emergency department with left lower quadrant abdominal pain and left lower back pain. Patient denies any worsening with movement. Denies any known trauma. Patient does have mild left lower quadrant tenderness but otherwise a benign abdomen. Her workup including labs and urinalysis are normal. I discussed with the patient a trial of pain medication and waiting 24-48 hours for recheck to see if pain improves spontaneously. Patient states she has never had pain like this before and rather obtain a CT scan today. Given the patient's complaints we'll obtain a CT of the abdomen/pelvis to further evaluate.  CT scan today is negative. Labs are largely within normal limits. We'll discharge with a short course of pain medication and orthopedic follow-up if she continues to have discomfort.  ____________________________________________   FINAL CLINICAL IMPRESSION(S) / ED DIAGNOSES  Left lower quadrant abdominal pain Lower back pain    Harvest Dark, MD 02/14/17 [phone]

## 2017-02-14 NOTE — ED Notes (Signed)
MD at bedside to update pt

## 2017-02-14 NOTE — ED Notes (Signed)
Vital signs stable. 

## 2017-02-14 NOTE — ED Triage Notes (Signed)
Pt c/o LLQ/left flank pain since last night.  Has had some diarrhea, no vomiting. Denies fevers. Pt appears uncomfortable.

## 2017-02-14 NOTE — ED Notes (Signed)
Patient here for LLQ pain and left flank pain. Patient guarding LLQ during assessment.

## 2017-02-14 NOTE — ED Notes (Signed)
Patient transported to CT 

## 2017-02-15 DIAGNOSIS — M48061 Spinal stenosis, lumbar region without neurogenic claudication: Secondary | ICD-10-CM | POA: Insufficient documentation

## 2017-02-15 DIAGNOSIS — M5416 Radiculopathy, lumbar region: Secondary | ICD-10-CM | POA: Insufficient documentation

## 2017-02-18 ENCOUNTER — Encounter: Payer: Self-pay | Admitting: Emergency Medicine

## 2017-02-18 ENCOUNTER — Emergency Department
Admission: EM | Admit: 2017-02-18 | Discharge: 2017-02-18 | Disposition: A | Discharge status: Home / Self Care | Payer: Medicare Other | Attending: Emergency Medicine | Admitting: Emergency Medicine

## 2017-02-18 DIAGNOSIS — G8929 Other chronic pain: Secondary | ICD-10-CM | POA: Diagnosis not present

## 2017-02-18 DIAGNOSIS — I10 Essential (primary) hypertension: Secondary | ICD-10-CM | POA: Insufficient documentation

## 2017-02-18 DIAGNOSIS — J45909 Unspecified asthma, uncomplicated: Secondary | ICD-10-CM | POA: Insufficient documentation

## 2017-02-18 DIAGNOSIS — M545 Low back pain: Secondary | ICD-10-CM | POA: Diagnosis present

## 2017-02-18 DIAGNOSIS — E119 Type 2 diabetes mellitus without complications: Secondary | ICD-10-CM | POA: Diagnosis not present

## 2017-02-18 DIAGNOSIS — M5442 Lumbago with sciatica, left side: Secondary | ICD-10-CM | POA: Insufficient documentation

## 2017-02-18 MED ORDER — OXYCODONE-ACETAMINOPHEN 5-325 MG PO TABS
1.0000 | ORAL_TABLET | Freq: Once | ORAL | Status: AC
  Administered 2017-02-18: 1 via ORAL
  Filled 2017-02-18: qty 1

## 2017-02-18 MED ORDER — IBUPROFEN 800 MG PO TABS
800.0000 mg | ORAL_TABLET | Freq: Once | ORAL | Status: AC
  Administered 2017-02-18: 800 mg via ORAL
  Filled 2017-02-18: qty 1

## 2017-02-18 MED ORDER — DIAZEPAM 2 MG PO TABS
2.0000 mg | ORAL_TABLET | Freq: Once | ORAL | Status: AC
  Administered 2017-02-18: 2 mg via ORAL
  Filled 2017-02-18: qty 1

## 2017-02-18 NOTE — ED Notes (Signed)
Pt reports she had an out patient MRI today at [place].

## 2017-02-18 NOTE — ED Notes (Signed)
Pt's pain reassessed. Pt had to be awakened from sleep and states the medication really helped. Pt rates pain at 0/10 at this time.

## 2017-02-18 NOTE — Discharge Instructions (Signed)
You have been seen in the Emergency Department (ED)  today for back pain.  Your workup and exam have not shown any acute abnormalities and you are likely suffering from muscle strain or possible problems with your discs, but there is no treatment that will fix your symptoms at this time.  Please continue to take the medications you have already been prescribed.  Please follow up with your doctor as soon as possible regarding today's ED visit and your back pain.  Return to the ED for worsening back pain, fever, weakness or numbness of either leg, or if you develop either (1) an inability to urinate or have bowel movements, or (2) loss of your ability to control your bathroom functions (if you start having "accidents"), or if you develop other new symptoms that concern you.

## 2017-02-18 NOTE — ED Provider Notes (Signed)
[facility] Emergency Department Provider Note  ____________________________________________   First MD Initiated Contact with Patient 02/18/17 [phone]     (approximate)  I have reviewed the triage vital signs and the nursing notes.   HISTORY  Chief Complaint Back Pain and Groin Pain    HPI Kayla Malone is a 56 y.o. female with a history as described below as well as recent visits with the ED and an orthopedic specialist for low back pain and LLE pain.  She presents tonight for re-evaluation of the same.  No change or worsening of the pain, it just constant and severe, starts in the left posterior hip and radiates down the left leg.  No numbness/tingling.  She is here with her family and they report she had an MRI as an outpatient at [facility] today.  Pain is better now but is generally sharp, constant, and movement makes it worse.  Family reports that she saw an orthopedic doctor last Wed (two days ago) who gave her prescriptions for hydrocodone, Prednisone, and "another medication".  The day before that in the ED, Dr. Kerman Passey gave her a prescription for Norco.  During her long wait for an exam room, she apparently was given both Percocet and Valium.  She is quite somnolent at this time but awakens to light touch and soft voice.  She reports that her pain is better.     Past Medical History:  Diagnosis Date  . Asthma   . Diabetes mellitus without complication ([place])   . Hypertension   . Stroke [facility])    x3    There are no active problems to display for this patient.   History reviewed. No pertinent surgical history.  Prior to Admission medications   Medication Sig Start Date End Date Taking? Authorizing Provider  cyclobenzaprine (FLEXERIL) 5 MG tablet Take 1 tablet (5 mg total) by mouth 3 (three) times daily as needed for muscle spasms. 11/05/16   Jenise V Bacon Menshew, PA-C  HYDROcodone-acetaminophen (NORCO/VICODIN) 5-325 MG tablet Take  1 tablet by mouth every 4 (four) hours as needed. 02/14/17   Harvest Dark, MD  ibuprofen (ADVIL,MOTRIN) 600 MG tablet Take 1 tablet (600 mg total) by mouth every 6 (six) hours as needed. 02/14/17   Harvest Dark, MD  meloxicam (MOBIC) 15 MG tablet Take 1 tablet (15 mg total) by mouth daily. 09/11/16   Charline Bills Cuthriell, PA-C  methocarbamol (ROBAXIN) 500 MG tablet Take 1 tablet (500 mg total) by mouth 4 (four) times daily. 09/11/16   Charline Bills Cuthriell, PA-C  nabumetone (RELAFEN) 750 MG tablet Take 1 tablet (750 mg total) by mouth 2 (two) times daily. 11/05/16   Jenise V Bacon Menshew, PA-C    Allergies Tramadol  History reviewed. No pertinent family history.  Social History Social History  Substance Use Topics  . Smoking status: Never Smoker  . Smokeless tobacco: Never Used  . Alcohol use No    Review of Systems Constitutional: No fever/chills Eyes: No visual changes. ENT: No sore throat. Cardiovascular: Denies chest pain. Respiratory: Denies shortness of breath. Gastrointestinal: No abdominal pain.  No nausea, no vomiting.  No diarrhea.  No constipation. Genitourinary: Negative for dysuria. Musculoskeletal: Low back pain radiating down left lower extremity Skin: Negative for rash. Neurological: Negative for headaches, focal weakness or numbness.  10-point ROS otherwise negative.  ____________________________________________   PHYSICAL EXAM:  VITAL SIGNS: ED Triage Vitals  Enc Vitals Group     BP 02/18/17 0018 (!) 168/82  Pulse Rate 02/18/17 0018 75     Resp 02/18/17 0018 (!) 22     Temp 02/18/17 0018 97.7 F (36.5 C)     Temp Source 02/18/17 0018 Oral     SpO2 02/18/17 0018 95 %     Weight 02/18/17 0020 160 lb (72.6 kg)     Height 02/18/17 0020 5\' 3"  (1.6 m)     Head Circumference --      Peak Flow --      Pain Score 02/18/17 0020 10     Pain Loc --      Pain Edu? --      Excl. in [place]? --     Constitutional: Somnolent.  NAD. Eyes: Conjunctivae  are normal. PERRL. EOMI. Head: Atraumatic. Nose: No congestion/rhinnorhea. Mouth/Throat: Mucous membranes are moist. Neck: No stridor.  No meningeal signs.   Cardiovascular: Normal rate, regular rhythm. Good peripheral circulation. Grossly normal heart sounds. Respiratory: Normal respiratory effort.  No retractions. Lungs CTAB. Gastrointestinal: Soft and nontender. No distention.  Musculoskeletal: No lower extremity tenderness nor edema. No gross deformities of extremities. Neurologic:  Slurred speech.  AOx3.  No gross focal neurologic deficits are appreciated. Able to move all four extremities.  Able to plantar flex and dorsiflex the left leg.  The leg is neurovascularly intact.  No signs of infectious process and no tenderness to palpation. Skin:  Skin is warm, dry and intact. No rash noted.   ____________________________________________   LABS (all labs ordered are listed, but only abnormal results are displayed)  Labs Reviewed - No data to display ____________________________________________  EKG  None - EKG not ordered by ED physician ____________________________________________  RADIOLOGY   No results found.  ____________________________________________   PROCEDURES  Procedure(s) performed:   Procedures   Critical Care performed: No ____________________________________________   INITIAL IMPRESSION / ASSESSMENT AND PLAN / ED COURSE  Pertinent labs & imaging results that were available during my care of the patient were reviewed by me and considered in my medical decision making (see chart for details).  The patient has hypertension but otherwise her vital signs are reassuring.  She is somnolent after getting oral narcotics and benzodiazepines while she was awaiting an exam room.  I explained to the patient and family that there is no other imaging indicated at this time and that I cannot prescribe any additional medications since she is already received 2 recent  prescriptions for hydrocodone.  I explained that she saw her orthopedic specialist couple days ago and they should be the ones to guide the therapy and they should continue using the steroids and other medications that were given as prescribed.  I looked in the care everywhere and because of how recently the MRI was obtained I cannot see the report, but I encouraged him to follow up at [facility] as scheduled for the results.    I gave my usual and customary return precautions.         ____________________________________________  FINAL CLINICAL IMPRESSION(S) / ED DIAGNOSES  Final diagnoses:  Chronic left-sided low back pain with left-sided sciatica     MEDICATIONS GIVEN DURING THIS VISIT:  Medications  oxyCODONE-acetaminophen (PERCOCET/ROXICET) 5-325 MG per tablet 1 tablet (1 tablet Oral Given 02/18/17 0043)  diazepam (VALIUM) tablet 2 mg (2 mg Oral Given 02/18/17 0043)  ibuprofen (ADVIL,MOTRIN) tablet 800 mg (800 mg Oral Given 02/18/17 0043)     NEW OUTPATIENT MEDICATIONS STARTED DURING THIS VISIT:  New Prescriptions   No medications on file  Modified Medications   No medications on file    Discontinued Medications   No medications on file     Note:  This document was prepared using Dragon voice recognition software and may include unintentional dictation errors.    Hinda Kehr, MD 02/18/17 [phone]

## 2017-02-18 NOTE — ED Notes (Signed)
Pt assisted to bathroom with 1 staff assist.  Pt reports improved pain

## 2017-02-18 NOTE — ED Notes (Signed)
Spoke with Dr Beather Arbour about pt. See new orders.

## 2017-02-18 NOTE — ED Triage Notes (Signed)
Pt to triage via wheelchair. Pt reports seen here on Tuesday/6/18 for pain to her left lower abd that radiated into her back and leg. Pt was given steroids and pain meds and is not getting any better. Pt reports pain actually starts over her left lower back hip region and radiates around into her left groin region and now radiates into the front of her left thigh to her knee.

## 2017-03-25 ENCOUNTER — Encounter: Payer: Self-pay | Admitting: Emergency Medicine

## 2017-03-25 ENCOUNTER — Emergency Department
Admission: EM | Admit: 2017-03-25 | Discharge: 2017-03-26 | Disposition: A | Discharge status: Home / Self Care | Payer: Medicare Other | Attending: Emergency Medicine | Admitting: Emergency Medicine

## 2017-03-25 ENCOUNTER — Emergency Department: Payer: Medicare Other

## 2017-03-25 DIAGNOSIS — R51 Headache: Secondary | ICD-10-CM | POA: Insufficient documentation

## 2017-03-25 DIAGNOSIS — E119 Type 2 diabetes mellitus without complications: Secondary | ICD-10-CM | POA: Insufficient documentation

## 2017-03-25 DIAGNOSIS — R531 Weakness: Secondary | ICD-10-CM

## 2017-03-25 DIAGNOSIS — R439 Unspecified disturbances of smell and taste: Secondary | ICD-10-CM

## 2017-03-25 DIAGNOSIS — Z79899 Other long term (current) drug therapy: Secondary | ICD-10-CM | POA: Diagnosis not present

## 2017-03-25 DIAGNOSIS — J45909 Unspecified asthma, uncomplicated: Secondary | ICD-10-CM | POA: Diagnosis not present

## 2017-03-25 DIAGNOSIS — I10 Essential (primary) hypertension: Secondary | ICD-10-CM | POA: Diagnosis not present

## 2017-03-25 DIAGNOSIS — R519 Headache, unspecified: Secondary | ICD-10-CM

## 2017-03-25 DIAGNOSIS — R42 Dizziness and giddiness: Secondary | ICD-10-CM | POA: Diagnosis present

## 2017-03-25 LAB — BASIC METABOLIC PANEL
ANION GAP: 5 (ref 5–15)
BUN: 8 mg/dL (ref 6–20)
CALCIUM: 8.9 mg/dL (ref 8.9–10.3)
CO2: 29 mmol/L (ref 22–32)
Chloride: 104 mmol/L (ref 101–111)
Creatinine, Ser: 0.75 mg/dL (ref 0.44–1.00)
Glucose, Bld: 193 mg/dL — ABNORMAL HIGH (ref 65–99)
Potassium: 3.9 mmol/L (ref 3.5–5.1)
SODIUM: 138 mmol/L (ref 135–145)

## 2017-03-25 LAB — URINALYSIS, COMPLETE (UACMP) WITH MICROSCOPIC
Bilirubin Urine: NEGATIVE
Glucose, UA: 50 mg/dL — AB
KETONES UR: NEGATIVE mg/dL
Leukocytes, UA: NEGATIVE
Nitrite: NEGATIVE
PROTEIN: NEGATIVE mg/dL
Specific Gravity, Urine: 1.003 — ABNORMAL LOW (ref 1.005–1.030)
pH: 6 (ref 5.0–8.0)

## 2017-03-25 LAB — CBC
HCT: 37.4 % (ref 35.0–47.0)
HEMOGLOBIN: 12.9 g/dL (ref 12.0–16.0)
MCH: 28.3 pg (ref 26.0–34.0)
MCHC: 34.5 g/dL (ref 32.0–36.0)
MCV: 82.2 fL (ref 80.0–100.0)
Platelets: 336 10*3/uL (ref 150–440)
RBC: 4.56 MIL/uL (ref 3.80–5.20)
RDW: 13.5 % (ref 11.5–14.5)
WBC: 9.8 10*3/uL (ref 3.6–11.0)

## 2017-03-25 LAB — TROPONIN I

## 2017-03-25 MED ORDER — DIPHENHYDRAMINE HCL 50 MG/ML IJ SOLN
25.0000 mg | Freq: Once | INTRAMUSCULAR | Status: AC
  Administered 2017-03-25: 25 mg via INTRAVENOUS
  Filled 2017-03-25: qty 1

## 2017-03-25 MED ORDER — PROCHLORPERAZINE EDISYLATE 5 MG/ML IJ SOLN
10.0000 mg | Freq: Once | INTRAMUSCULAR | Status: AC
  Administered 2017-03-25: 10 mg via INTRAVENOUS
  Filled 2017-03-25: qty 2

## 2017-03-25 MED ORDER — SODIUM CHLORIDE 0.9 % IV BOLUS (SEPSIS)
1000.0000 mL | Freq: Once | INTRAVENOUS | Status: AC
  Administered 2017-03-25: 1000 mL via INTRAVENOUS

## 2017-03-25 NOTE — ED Provider Notes (Signed)
[facility] Emergency Department Provider Note  ____________________________________________   First MD Initiated Contact with Patient 03/25/17 2037     (approximate)  I have reviewed the triage vital signs and the nursing notes.   HISTORY  Chief Complaint Dizziness   HPI Kayla Malone is a 56 y.o. female with a history of stroke, asthma and diabetes who is presenting to the emergency department today with 1 week of mild frontal headache as well as intermittent dizziness and a foul smell. She says that when she had her stroke 3 years ago that she had alterations of her smell which made her concerned that she may be having an additional stroke at this time. She says that she is also had increasing weakness over the past week to her left upper lower extremities. She was left week after her stroke but says that the weakness appears to be worsening. Has been taking ibuprofen over the week at home without relief.     Past Medical History:  Diagnosis Date  . Asthma   . Diabetes mellitus without complication ([place])   . Hypertension   . Stroke [facility])    x3    There are no active problems to display for this patient.   History reviewed. No pertinent surgical history.  Prior to Admission medications   Medication Sig Start Date End Date Taking? Authorizing Provider  cyclobenzaprine (FLEXERIL) 5 MG tablet Take 1 tablet (5 mg total) by mouth 3 (three) times daily as needed for muscle spasms. 11/05/16   Jenise V Bacon Menshew, PA-C  HYDROcodone-acetaminophen (NORCO/VICODIN) 5-325 MG tablet Take 1 tablet by mouth every 4 (four) hours as needed. 02/14/17   Harvest Dark, MD  ibuprofen (ADVIL,MOTRIN) 600 MG tablet Take 1 tablet (600 mg total) by mouth every 6 (six) hours as needed. 02/14/17   Harvest Dark, MD  meloxicam (MOBIC) 15 MG tablet Take 1 tablet (15 mg total) by mouth daily. 09/11/16   Charline Bills Cuthriell, PA-C  methocarbamol (ROBAXIN) 500  MG tablet Take 1 tablet (500 mg total) by mouth 4 (four) times daily. 09/11/16   Charline Bills Cuthriell, PA-C  nabumetone (RELAFEN) 750 MG tablet Take 1 tablet (750 mg total) by mouth 2 (two) times daily. 11/05/16   Jenise V Bacon Menshew, PA-C    Allergies Tramadol  History reviewed. No pertinent family history.  Social History Social History  Substance Use Topics  . Smoking status: Never Smoker  . Smokeless tobacco: Never Used  . Alcohol use No    Review of Systems Constitutional: No fever/chills Eyes: No visual changes. ENT: No sore throat. Cardiovascular: Denies chest pain. Respiratory: Denies shortness of breath. Gastrointestinal: No abdominal pain.  No nausea, no vomiting.  No diarrhea.  No constipation. Genitourinary: Negative for dysuria. Musculoskeletal: Negative for back pain. Skin: Negative for rash. Neurological: Negative for focal numbness.  10-point ROS otherwise negative.  ____________________________________________   PHYSICAL EXAM:  VITAL SIGNS: ED Triage Vitals  Enc Vitals Group     BP 03/25/17 1843 139/90     Pulse Rate 03/25/17 1843 90     Resp 03/25/17 1843 20     Temp 03/25/17 1843 99.1 F (37.3 C)     Temp Source 03/25/17 1843 Oral     SpO2 03/25/17 1843 100 %     Weight 03/25/17 1843 154 lb (69.9 kg)     Height 03/25/17 1843 5\' 3"  (1.6 m)     Head Circumference --      Peak Flow --  Pain Score 03/25/17 1847 6     Pain Loc --      Pain Edu? --      Excl. in [place]? --     Constitutional: Alert and oriented. Well appearing and in no acute distress. Eyes: Conjunctivae are normal. EOMI. Head: Atraumatic. Nose: No congestion/rhinnorhea. Mouth/Throat: Mucous membranes are moist.  Oropharynx non-erythematous. Neck: No stridor.   Cardiovascular: Normal rate, regular rhythm. Grossly normal heart sounds.  Good peripheral circulation. Respiratory: Normal respiratory effort.  No retractions. Lungs CTAB. Gastrointestinal: Soft and nontender. No  distention. No abdominal bruits. No CVA tenderness. Musculoskeletal: No lower extremity tenderness nor edema.  No joint effusions. Neurologic:  Normal speech and language. No facial asymmetry. 4/5 strength in the upper and lower left extremities and 5 out of 5 strength to the right upper and lower extremities.  No sensation deficits to light touch. No nystagmus. Patient says that the deficit on the left is just slightly increased from the right from her baseline. Skin:  Skin is warm, dry and intact. No rash noted. Psychiatric: Mood and affect are normal. Speech and behavior are normal.  NIH Stroke Scale   Person Administering Scale: Doran Stabler  Administer stroke scale items in the order listed. Record performance in each category after each subscale exam. Do not go back and change scores. Follow directions provided for each exam technique. Scores should reflect what the patient does, not what the clinician thinks the patient can do. The clinician should record answers while administering the exam and work quickly. Except where indicated, the patient should not be coached (i.e., repeated requests to patient to make a special effort).   1a  Level of consciousness: 0=alert; keenly responsive  1b. LOC questions:  0=Performs both tasks correctly  1c. LOC commands: 0=Performs both tasks correctly  2.  Best Gaze: 0=normal  3.  Visual: 0=No visual loss  4. Facial Palsy: 0=Normal symmetric movement  5a.  Motor left arm: 1=Drift, limb holds 90 (or 45) degrees but drifts down before full 10 seconds: does not hit bed  5b.  Motor right arm: 0=No drift, limb holds 90 (or 45) degrees for full 10 seconds  6a. motor left leg: 1=Drift, limb holds 90 (or 45) degrees but drifts down before full 10 seconds: does not hit bed  6b  Motor right leg:  0=No drift, limb holds 90 (or 45) degrees for full 10 seconds  7. Limb Ataxia: 0=Absent  8.  Sensory: 0=Normal; no sensory loss  9. Best Language:  0=No  aphasia, normal  10. Dysarthria: 0=Normal  11. Extinction and Inattention: 0=No abnormality  12. Distal motor function: 0=Normal   Total:   2    ____________________________________________   LABS (all labs ordered are listed, but only abnormal results are displayed)  Labs Reviewed  URINALYSIS, COMPLETE (UACMP) WITH MICROSCOPIC  BASIC METABOLIC PANEL  CBC  TROPONIN I   ____________________________________________  EKG  ED ECG REPORT I, Tayloranne Lekas,  Youlanda Roys, the attending physician, personally viewed and interpreted this ECG.   Date: 03/26/2017  EKG Time: 1851  Rate: 84  Rhythm: normal sinus rhythm  Axis: normal  Intervals:none  ST&T Change: No ST segment elevation or depression. No abnormal T-wave inversion.  ____________________________________________  [id number] DG Chest 2 View (Final result)  Result time 03/25/17 20:41:19  Final result by Inez Catalina, MD (03/25/17 20:41:19)           Narrative:   CLINICAL DATA: Lightheadedness  EXAM: CHEST 2 VIEW  COMPARISON: 05/20/2016  FINDINGS: The heart size and mediastinal contours are within normal limits. Both lungs are clear. The visualized skeletal structures are unremarkable.  IMPRESSION: No active cardiopulmonary disease.   Electronically Signed By: Inez Catalina M.D. On: 03/25/2017 20:41            CT Head Wo Contrast (Final result)  Result time 03/25/17 19:33:46  Final result by Massie Kluver, MD (03/25/17 19:33:46)           Narrative:   CLINICAL DATA: Lightheadedness, unsteady gait, stuttering at times with word repetition.  EXAM: CT HEAD WITHOUT CONTRAST  TECHNIQUE: Contiguous axial images were obtained from the base of the skull through the vertex without intravenous contrast.  COMPARISON: 07/04/2011  FINDINGS: Brain: Chronic right-sided pontine infarct with encephalomalacia. No acute intracranial hemorrhage, midline shift nor large vascular territory infarction.  Pearline Cables - white matter differentiation is maintained. No intra-axial mass nor extra-axial fluid collections. There is no hydrocephalus. Midline cisterns and fourth ventricle. No edema.  Vascular: No hyperdense vessel or unexpected calcification.  Skull: Normal. Negative for fracture or focal lesion.  Sinuses/Orbits: No acute finding.  Other: None.  IMPRESSION: Chronic right-sided pontine infarct. No acute intracranial abnormality.   Electronically Signed By: Ashley Royalty M.D. On: 03/25/2017 19:33             ____________________________________________   PROCEDURES  Procedure(s) performed:   Procedures  Critical Care performed:   ____________________________________________   INITIAL IMPRESSION / ASSESSMENT AND PLAN / ED COURSE  Pertinent labs & imaging results that were available during my care of the patient were reviewed by me and considered in my medical decision making (see chart for details).  ----------------------------------------- 2330 PM on 03/25/2017 -----------------------------------------  Patient pending MRI at this time. Signed out to Dr. Karma Greaser.  Clinical Course as of Apr 15 0001  Sat Mar 25, 2017  2330 Assuming care from Dr. Clearnce Hasten.  In short, Kayla Malone is a 56 y.o. female with a chief complaint of left sided weakness and concern for CVA.  Refer to the original H&P for additional details.  The current plan of care is to follow up MRI and reassess.   [CF]    Clinical Course User Index [CF] Hinda Kehr, MD     ____________________________________________   FINAL CLINICAL IMPRESSION(S) / ED DIAGNOSES  Headache.  Cacosmia.  Left sided weakness.     NEW MEDICATIONS STARTED DURING THIS VISIT:  New Prescriptions   No medications on file     Note:  This document was prepared using Dragon voice recognition software and may include unintentional dictation errors.    Orbie Pyo,  MD 03/26/17 [phone]

## 2017-03-25 NOTE — ED Notes (Signed)
Schaevitz MD inquired about patient's lack of lab results, when I called lab and asked them they said "Estill Bamberg" was called and told that the previous samples had hemolyzed.  I never received the message from anyone that a redraw was needed for this patient.

## 2017-03-25 NOTE — ED Notes (Signed)
1 IV attempt, IV would not thread

## 2017-03-25 NOTE — ED Provider Notes (Signed)
Clinical Course as of Mar 26 144  Sat Mar 25, 2017  2330 Assuming care from Dr. Clearnce Hasten.  In short, Kayla Malone is a 56 y.o. female with a chief complaint of left sided weakness and concern for CVA.  Refer to the original H&P for additional details.  The current plan of care is to follow up MRI and reassess.   [CF]  Sun Mar 26, 2017  0144 MRI was somewhat abbreviated due to the patient's claustrophobia but there was no evidence of acute ischemia according to the radiologist.  I updated the patient and her main concern was still that she is experiencing a bad smell but I explained that I do not have a better explanation for that at this time.  She has, based on my limited interaction with her, no evidence of sinusitis and I do not think she has an acute or emergent medical condition indicative of the bad smell she is experiencing.  She will follow up as an outpatient.  [CF]    Clinical Course User Index [CF] Hinda Kehr, MD    Dg Chest 2 View  Result Date: 03/25/2017 CLINICAL DATA:  Lightheadedness EXAM: CHEST  2 VIEW COMPARISON:  05/20/2016 FINDINGS: The heart size and mediastinal contours are within normal limits. Both lungs are clear. The visualized skeletal structures are unremarkable. IMPRESSION: No active cardiopulmonary disease. Electronically Signed   By: Inez Catalina M.D.   On: 03/25/2017 20:41   Ct Head Wo Contrast  Result Date: 03/25/2017 CLINICAL DATA:  Lightheadedness, unsteady gait, stuttering at times with word repetition. EXAM: CT HEAD WITHOUT CONTRAST TECHNIQUE: Contiguous axial images were obtained from the base of the skull through the vertex without intravenous contrast. COMPARISON:  07/04/2011 FINDINGS: Brain: Chronic right-sided pontine infarct with encephalomalacia. No acute intracranial hemorrhage, midline shift nor large vascular territory infarction. Pearline Cables - white matter differentiation is maintained. No intra-axial mass nor extra-axial fluid  collections. There is no hydrocephalus. Midline cisterns and fourth ventricle. No edema. Vascular: No hyperdense vessel or unexpected calcification. Skull: Normal. Negative for fracture or focal lesion. Sinuses/Orbits: No acute finding. Other: None. IMPRESSION: Chronic right-sided pontine infarct. No acute intracranial abnormality. Electronically Signed   By: Ashley Royalty M.D.   On: 03/25/2017 19:33   Mr Brain Wo Contrast  Result Date: 03/26/2017 CLINICAL DATA:  Left-sided weakness and headache. EXAM: MRI HEAD WITHOUT CONTRAST TECHNIQUE: Multiplanar, multiecho pulse sequences of the brain and surrounding structures were obtained without intravenous contrast. COMPARISON:  Brain MRI 06/29/2011 FINDINGS: The examination had to be discontinued prior to completion due to patient claustrophobia. Sagittal T1 weighted sequence and axial and coronal diffusion-weighted imaging was performed and corresponding ADC maps were generated. There is no acute ischemia. There is an old small infarct in the right pons. No midline shift or other mass effect. No hydrocephalus. IMPRESSION: Truncated examination secondary to patient claustrophobia. No acute ischemia. Electronically Signed   By: Ulyses Jarred M.D.   On: 03/26/2017 01:32      Hinda Kehr, MD 03/26/17 [phone]

## 2017-03-25 NOTE — ED Triage Notes (Signed)
Pt c/o "feeling funny" in her head. Has been lightheaded.  Stuttering at times in triage; repeating same words. Does c/o pain to head as well. Sx X 1 week; hx of 3 strokes.  Off balance walking from bench to chair for EKG.  Has weakness on left from previous stroke.  Weakness to left grip noted; has been worse last 2 days.  Also has had chest tightness for past 2 days.

## 2017-03-26 ENCOUNTER — Emergency Department: Payer: Medicare Other

## 2017-03-26 DIAGNOSIS — R51 Headache: Secondary | ICD-10-CM | POA: Diagnosis not present

## 2017-03-26 MED ORDER — LORAZEPAM 2 MG/ML IJ SOLN
1.0000 mg | Freq: Once | INTRAMUSCULAR | Status: DC
  Filled 2017-03-26: qty 1

## 2017-03-26 NOTE — ED Notes (Signed)
MRI tech unable to get reliable surgical history from anyone in relation to this patient.  She did have a Abdominal CT recently and that will be used to clear her

## 2017-03-26 NOTE — ED Notes (Signed)
MRI called to ask patient questionnaire

## 2017-03-26 NOTE — ED Notes (Signed)
Pt taken to MRI  

## 2017-03-26 NOTE — Discharge Instructions (Signed)
As we discussed, your workup today was reassuring.  Though we do not know exactly what is causing your symptoms, it appears that you have no emergent medical condition at this time and that you are safe to go home and follow up as recommended in this paperwork. ° °Please return immediately to the Emergency Department if you develop any new or worsening symptoms that concern you. ° °

## 2017-03-26 NOTE — ED Notes (Signed)
MRI tech unable to get patient history due to her being a poor historian.  She is attempting to contact other family and friend to determine if she has had any surgical procedure.

## 2017-03-26 NOTE — ED Notes (Signed)
I was told by Altha Harm, RN after returning from lunch this patient had an anxious episode during CT and Ativan was taken to her.  Elmo Putt RN stated she did not have to administer the medication because the patient had finished the CT by the time she arrived with the medication.

## 2017-05-24 ENCOUNTER — Encounter: Payer: Self-pay | Admitting: Emergency Medicine

## 2017-05-24 ENCOUNTER — Emergency Department
Admission: EM | Admit: 2017-05-24 | Discharge: 2017-05-24 | Disposition: A | Discharge status: Home / Self Care | Payer: Medicare Other | Attending: Emergency Medicine | Admitting: Emergency Medicine

## 2017-05-24 ENCOUNTER — Emergency Department: Payer: Medicare Other

## 2017-05-24 DIAGNOSIS — Z8673 Personal history of transient ischemic attack (TIA), and cerebral infarction without residual deficits: Secondary | ICD-10-CM | POA: Diagnosis not present

## 2017-05-24 DIAGNOSIS — R220 Localized swelling, mass and lump, head: Secondary | ICD-10-CM | POA: Insufficient documentation

## 2017-05-24 DIAGNOSIS — I10 Essential (primary) hypertension: Secondary | ICD-10-CM | POA: Insufficient documentation

## 2017-05-24 DIAGNOSIS — R079 Chest pain, unspecified: Secondary | ICD-10-CM | POA: Diagnosis present

## 2017-05-24 DIAGNOSIS — E119 Type 2 diabetes mellitus without complications: Secondary | ICD-10-CM | POA: Diagnosis not present

## 2017-05-24 DIAGNOSIS — J45909 Unspecified asthma, uncomplicated: Secondary | ICD-10-CM | POA: Diagnosis not present

## 2017-05-24 LAB — BASIC METABOLIC PANEL
Anion gap: 8 (ref 5–15)
BUN: 9 mg/dL (ref 6–20)
CHLORIDE: 100 mmol/L — AB (ref 101–111)
CO2: 29 mmol/L (ref 22–32)
Calcium: 9 mg/dL (ref 8.9–10.3)
Creatinine, Ser: 0.89 mg/dL (ref 0.44–1.00)
Glucose, Bld: 129 mg/dL — ABNORMAL HIGH (ref 65–99)
POTASSIUM: 3 mmol/L — AB (ref 3.5–5.1)
SODIUM: 137 mmol/L (ref 135–145)

## 2017-05-24 LAB — CBC
HEMATOCRIT: 38.2 % (ref 35.0–47.0)
Hemoglobin: 12.9 g/dL (ref 12.0–16.0)
MCH: 27.8 pg (ref 26.0–34.0)
MCHC: 33.7 g/dL (ref 32.0–36.0)
MCV: 82.5 fL (ref 80.0–100.0)
PLATELETS: 319 10*3/uL (ref 150–440)
RBC: 4.63 MIL/uL (ref 3.80–5.20)
RDW: 13.3 % (ref 11.5–14.5)
WBC: 5.9 10*3/uL (ref 3.6–11.0)

## 2017-05-24 LAB — TROPONIN I
Troponin I: <0.03 ng/mL (ref <0.03)
Troponin I: <0.03 ng/mL (ref <0.03)

## 2017-05-24 MED ORDER — GI COCKTAIL ~~LOC~~
30.0000 mL | Freq: Once | ORAL | Status: AC
  Administered 2017-05-24: 30 mL via ORAL

## 2017-05-24 MED ORDER — ONDANSETRON HCL 4 MG/2ML IJ SOLN
4.0000 mg | Freq: Once | INTRAMUSCULAR | Status: AC
  Administered 2017-05-24: 4 mg via INTRAVENOUS

## 2017-05-24 MED ORDER — METHYLPREDNISOLONE SODIUM SUCC 125 MG IJ SOLR
125.0000 mg | Freq: Once | INTRAMUSCULAR | Status: AC
  Administered 2017-05-24: 125 mg via INTRAVENOUS

## 2017-05-24 MED ORDER — POTASSIUM CHLORIDE CRYS ER 20 MEQ PO TBCR
40.0000 meq | EXTENDED_RELEASE_TABLET | Freq: Once | ORAL | Status: AC
  Administered 2017-05-24: 40 meq via ORAL

## 2017-05-24 MED ORDER — FAMOTIDINE 40 MG PO TABS: 40.0000 mg | ORAL_TABLET | Freq: Every evening | ORAL | 0 refills | Status: DC

## 2017-05-24 MED ORDER — DIPHENHYDRAMINE HCL 50 MG/ML IJ SOLN
12.5000 mg | Freq: Once | INTRAMUSCULAR | Status: AC
  Administered 2017-05-24: 12.5 mg via INTRAVENOUS

## 2017-05-24 MED ORDER — FAMOTIDINE 20 MG PO TABS
40.0000 mg | ORAL_TABLET | Freq: Once | ORAL | Status: AC
  Administered 2017-05-24: 40 mg via ORAL

## 2017-05-24 MED ORDER — PREDNISONE 20 MG PO TABS: 60.0000 mg | ORAL_TABLET | Freq: Every day | ORAL | 0 refills | Status: DC

## 2017-05-24 MED ORDER — DIPHENHYDRAMINE HCL 25 MG PO CAPS: 25.0000 mg | ORAL_CAPSULE | ORAL | 0 refills | Status: DC | PRN

## 2017-05-24 NOTE — Discharge Instructions (Signed)
STOP TAKING LISINOPRIL and never take that medication, or any other ACE-INHIBITOR medication in the future. Please make a follow-up appointment with your primary care physician for reevaluation.  Please make a follow-up appointment with the cardiologist, for reevaluation and possibly a stress test.  Return to the emergency department for severe pain, shortness of breath, lightheadedness or fainting, swelling, drooling or shortness of breath, or for any other symptoms concerning to you.

## 2017-05-24 NOTE — ED Notes (Signed)
Pt taken to Xray by Morey Hummingbird, EDT and then to go to room 5.

## 2017-05-24 NOTE — ED Notes (Signed)
Gave patient potassium. Had a hard time swallowing it. Did cough and spit up some. Patient was able to swallow, breathe and speak in sentences prior to wheeling out.

## 2017-05-24 NOTE — ED Provider Notes (Signed)
[facility] Emergency Department Provider Note  ____________________________________________  Time seen: Approximately 7:36 PM  I have reviewed the triage vital signs and the nursing notes.   HISTORY  Chief Complaint Chest Pain and Allergic Reaction    HPI Kayla Malone is a 56 y.o. female w/ a hx of HTN, HL, DM, and CVA presenting with chest pain and facial swelling.  The pt reports that since 5pm, she has been having frequent brief 2-3 min episodes of a substernal "dull stabbing" pain w/o assoc radiation, sob, palpitations, lightheadedness, syncope, n/v.  They self-resolve.  Shortly after 5, she drove to her daughter's house and noted that the right side of her face and right lower lip were swollen.  No trauma, known insect bite, or known allergies.  No new medications or foods, or beauty products.  No drooling, sob, voice change.  Last meal 5pm with usual foods.The patient takes Lisinopril.  Last stress test > 12 months ago.   Past Medical History:  Diagnosis Date  . Asthma   . Diabetes mellitus without complication ([place])   . Hypertension   . Stroke [facility])    x3    There are no active problems to display for this patient.   History reviewed. No pertinent surgical history.  Current Outpatient Rx  . Order #: [id number] Class: Historical Med  . Order #: [id number] Class: Historical Med  . Order #: [id number] Class: Historical Med  . Order #: [id number] Class: Historical Med  . Order #: [id number] Class: Historical Med  . Order #: [id number] Class: Print  . Order #: [id number] Class: Print  . Order #: [id number] Class: Print  . Order #: [id number] Class: Historical Med  . Order #: [id number] Class: Historical Med  . Order #: [id number] Class: Historical Med  . Order #: [id number] Class: Print  . Order #: [id number] Class: Print  . Order #: [id number] Class: Print  . Order #: [id number] Class: Historical Med  . Order #: [id number] Class: Print  . Order #:  [id number] Class: Print  . Order #: [id number] Class: Historical Med  . Order #: [id number] Class: Print  . Order #: [id number] Class: Historical Med  . Order #: [id number] Class: Historical Med    Allergies Tramadol  History reviewed. No pertinent family history.  Social History Social History  Substance Use Topics  . Smoking status: Never Smoker  . Smokeless tobacco: Never Used  . Alcohol use No    Review of Systems Constitutional: No fever/chills.  No lightheadedness or syncope.No trauma. Eyes: No visual changes. ENT: No sore throat. No congestion or rhinorrhea. No dental pain. Face: + right facial swelling Cardiovascular: Positive chest pain. Denies palpitations. Respiratory: Denies shortness of breath.  No cough. Gastrointestinal: No abdominal pain.  No nausea, no vomiting.  No diarrhea.  No constipation. Genitourinary: Negative for dysuria. Musculoskeletal: Negative for back pain. Skin: Negative for rash. Neurological: Mild headache. No focal numbness, tingling or weakness.     ____________________________________________   PHYSICAL EXAM:  VITAL SIGNS: ED Triage Vitals  Enc Vitals Group     BP 05/24/17 1830 (!) 149/81     Pulse Rate 05/24/17 1830 80     Resp 05/24/17 1830 16     Temp 05/24/17 1830 98.6 F (37 C)     Temp Source 05/24/17 1830 Oral     SpO2 05/24/17 1830 96 %     Weight 05/24/17 1830 160 lb (72.6 kg)     Height 05/24/17 1830 5\' 3"  (1.6 m)     Head Circumference --      Peak Flow --  Pain Score 05/24/17 1846 9     Pain Loc --      Pain Edu? --      Excl. in [place]? --     Constitutional: Alert and oriented. Well appearing and in no acute distress. Answers questions appropriately. Eyes: Conjunctivae are normal.  EOMI. No scleral icterus. Head: Patient has some mild swelling over the right perioral area in the right lower lip on half of the lip. Nose: No congestion/rhinnorhea. Mouth/Throat: Mucous membranes are moist. No swelling of the tongue,  under the tongue, posterior pharynx is normal in appearance, palate is normal and symmetric, and uvula is midline. There is no drooling, stridor, or hoarse voice. Neck: No stridor.  Supple.  No JVD. No meningismus. Cardiovascular: Normal rate, regular rhythm. No murmurs, rubs or gallops.  Respiratory: Normal respiratory effort.  No accessory muscle use or retractions. Lungs CTAB.  No wheezes, rales or ronchi. Gastrointestinal: Soft, nontender and nondistended.  No guarding or rebound.  No peritoneal signs. Musculoskeletal: No LE edema.  Neurologic:  A&Ox3.  Speech is clear.  Face and smile are symmetric.  EOMI.  Moves all extremities well. Skin:  Skin is warm, dry and intact. No rash noted. Psychiatric: Mood and affect are normal. Speech and behavior are normal.  Normal judgement.  ____________________________________________   LABS (all labs ordered are listed, but only abnormal results are displayed)  Labs Reviewed  BASIC METABOLIC PANEL - Abnormal; Notable for the following:       Result Value   Potassium 3.0 (*)    Chloride 100 (*)    Glucose, Bld 129 (*)    All other components within normal limits  CBC  TROPONIN I  TROPONIN I   ____________________________________________  EKG  ED ECG REPORT I, Eula Listen, the attending physician, personally viewed and interpreted this ECG.   Date: 05/24/2017  EKG Time: 1833  Rate: 86  Rhythm: normal sinus rhythm  Axis: normal  Intervals:none  ST&T Change: Nonspecific T-wave inversion in V1. No ST elevation.  ____________________________________________  RADIOLOGY  Dg Chest 2 View  Result Date: 05/24/2017 CLINICAL DATA:  Acute left chest pain today. EXAM: CHEST  2 VIEW COMPARISON:  03/25/2017 and prior radiographs . FINDINGS: The cardiomediastinal silhouette is unremarkable. Mild interstitial prominence is unchanged. There is no evidence of focal airspace disease, pulmonary edema, suspicious pulmonary nodule/mass,  pleural effusion, or pneumothorax. No acute bony abnormalities are identified. IMPRESSION: No evidence of acute cardiopulmonary disease. Electronically Signed   By: Margarette Canada M.D.   On: 05/24/2017 19:02    ____________________________________________   PROCEDURES  Procedure(s) performed: None  Procedures  Critical Care performed: No ____________________________________________   INITIAL IMPRESSION / ASSESSMENT AND PLAN / ED COURSE  Pertinent labs & imaging results that were available during my care of the patient were reviewed by me and considered in my medical decision making (see chart for details).  56 y.o. female with no known allergies presenting with right facial swelling, substernal chest pain episodes. The patient's chest pain episodes are atypical, and I would consider allergic reaction to tide both of these symptoms together but the patient has not had any new foods, or medications. It is possible that the patient has angioedema, and I will have her stop the lisinopril and monitor her here. Case this is an allergic reaction, she'll be treated with Solu-Medrol, Benadryl, and Pepcid. He does not have, but has multiple cardiac risk factors we will get a full evaluation. Plan reevaluation for final disposition.  -----------------------------------------  9:36 PM on 05/24/2017 -----------------------------------------  The patient's workup in the emergency department has been reassuring. EKG does not show any ischemic changes, chest x-ray did not show any acute process cardiopulmonary examination is normal and negative. I have encouraged her to make a follow-up appointment with cardiologist on call for reevaluation and possible stress test or other risk stratification study in the next 1-2 days.  The patient's facial swelling has improved since she has been here and she does not have any evidence of airway compromise. At this time, the patient is safe for discharge and will go home  with 5 days of antihistamine and steroid use.  The patient understands return precautions as well as follow-up instructions. She has been instructed to stop her lisinopril and never take an ACE inhibitor again in the future. She understands this and has it written down her discharge paperwork as well.   ____________________________________________  FINAL CLINICAL IMPRESSION(S) / ED DIAGNOSES  Final diagnoses:  Chest pain, unspecified type  Right facial swelling         NEW MEDICATIONS STARTED DURING THIS VISIT:  New Prescriptions   DIPHENHYDRAMINE (BENADRYL) 25 MG CAPSULE    Take 1 capsule (25 mg total) by mouth every 4 (four) hours as needed.   FAMOTIDINE (PEPCID) 40 MG TABLET    Take 1 tablet (40 mg total) by mouth every evening.   PREDNISONE (DELTASONE) 20 MG TABLET    Take 3 tablets (60 mg total) by mouth daily.      Eula Listen, MD 05/24/17 2138

## 2017-05-24 NOTE — ED Triage Notes (Signed)
Pt to ED c/o chest pain to center chest stabbing pain 45 min PTA.  Denies radiation or SOB.  Also states swelling to right lower lips and jaw.  States was out eating prior.  Reports taking protonix, ASA 81mg  x2, metformin.  Pt with hx of prior stroke affecting left side with weakness.

## 2017-05-29 ENCOUNTER — Telehealth: Payer: Self-pay

## 2017-05-29 NOTE — Telephone Encounter (Signed)
Line was busy/called 2 times Pt was seen in ED on 05/24/17 for CP  Needs a follow up from this  Will try again at a later time

## 2017-05-30 NOTE — Telephone Encounter (Signed)
Number was incorrect/ will send letter to patient

## 2017-06-21 NOTE — Progress Notes (Deleted)
Cardiology Office Note  Date:  06/21/2017   ID:  Kayla Malone, DOB January 04, 1961, MRN [id number]  PCP:  Patient, [name]   No chief complaint on file.   HPI:  Kayla Malone is a 56 y.o. female w/ a hx of HTN,  HL,  DM,  CVA , mild L residual weakness asthma, diabetes, hypertension, Multiple trips to the ER for various issues Stress test at [facility] 09/14/2013 , no ischemia, EF 68% Seen in ER 05/24/2017 for chest pain and facial swelling.  frequent brief 2-3 min episodes of a substernal "dull stabbing" pain    They self-resolve. the right side of her face and right lower lip were swollen.   The patient takes Lisinopril.  Last stress test > 12 months ago.  treated with Solu-Medrol, Benadryl, and Pepcid.  stop her lisinopril    PMH:   has a past medical history of Asthma; Diabetes mellitus without complication ([place]); Hypertension; and Stroke [facility]).  PSH:   No past surgical history on file.  Current Outpatient Prescriptions  Medication Sig Dispense Refill  . albuterol (PROVENTIL HFA;VENTOLIN HFA) 108 (90 Base) MCG/ACT inhaler Inhale 2 puffs into the lungs every 6 (six) hours as needed.    Marland Kitchen amLODipine (NORVASC) 5 MG tablet Take 5 mg by mouth daily.    Marland Kitchen aspirin EC 81 MG tablet Take 81 mg by mouth daily.    Marland Kitchen atorvastatin (LIPITOR) 40 MG tablet Take 40 mg by mouth daily.    Marland Kitchen buPROPion (ZYBAN) 150 MG 12 hr tablet Take 150 mg by mouth 2 (two) times daily.  5  . cyclobenzaprine (FLEXERIL) 5 MG tablet Take 1 tablet (5 mg total) by mouth 3 (three) times daily as needed for muscle spasms. 15 tablet 0  . diphenhydrAMINE (BENADRYL) 25 mg capsule Take 1 capsule (25 mg total) by mouth every 4 (four) hours as needed. 20 capsule 0  . famotidine (PEPCID) 40 MG tablet Take 1 tablet (40 mg total) by mouth every evening. 5 tablet 0  . fluticasone (FLONASE) 50 MCG/ACT nasal spray Place 2 sprays into both nostrils daily.    . Fluticasone-Salmeterol (ADVAIR DISKUS)  500-50 MCG/DOSE AEPB Inhale 1 puff into the lungs 2 (two) times daily.    . hydrochlorothiazide (HYDRODIURIL) 25 MG tablet Take 25 mg by mouth daily.    Marland Kitchen HYDROcodone-acetaminophen (NORCO/VICODIN) 5-325 MG tablet Take 1 tablet by mouth every 4 (four) hours as needed. 12 tablet 0  . ibuprofen (ADVIL,MOTRIN) 600 MG tablet Take 1 tablet (600 mg total) by mouth every 6 (six) hours as needed. 30 tablet 0  . meloxicam (MOBIC) 15 MG tablet Take 1 tablet (15 mg total) by mouth daily. 30 tablet 0  . metFORMIN (GLUCOPHAGE) 500 MG tablet Take 500 mg by mouth 2 (two) times daily.  5  . methocarbamol (ROBAXIN) 500 MG tablet Take 1 tablet (500 mg total) by mouth 4 (four) times daily. 16 tablet 0  . nabumetone (RELAFEN) 750 MG tablet Take 1 tablet (750 mg total) by mouth 2 (two) times daily. 30 tablet 0  . oxyCODONE (OXY IR/ROXICODONE) 5 MG immediate release tablet Take 1 tablet by mouth every 4 (four) hours.    . predniSONE (DELTASONE) 20 MG tablet Take 3 tablets (60 mg total) by mouth daily. 15 tablet 0  . traZODone (DESYREL) 50 MG tablet Take 50 mg by mouth at bedtime.  5  . vitamin B-12 (CYANOCOBALAMIN) 1000 MCG tablet Take 1,000 mcg by mouth daily.  No current facility-administered medications for this visit.      Allergies:   Tramadol   Social History:  The patient  reports that she has never smoked. She has never used smokeless tobacco. She reports that she does not drink alcohol or use drugs.   Family History:   family history is not on file.    Review of Systems: ROS   PHYSICAL EXAM: VS:  There were no vitals taken for this visit. , BMI There is no height or weight on file to calculate BMI. GEN: Well nourished, well developed, in no acute distress HEENT: normal Neck: no JVD, carotid bruits, or masses Cardiac: RRR; no murmurs, rubs, or gallops,no edema  Respiratory:  clear to auscultation bilaterally, normal work of breathing GI: soft, nontender, nondistended, + BS MS: no deformity or  atrophy Skin: warm and dry, no rash Neuro:  Strength and sensation are intact Psych: euthymic mood, full affect    Recent Labs: 05/24/2017: BUN 9; Creatinine, Ser 0.89; Hemoglobin 12.9; Platelets 319; Potassium 3.0; Sodium 137    Lipid Panel No results found for: CHOL, HDL, LDLCALC, TRIG    Wt Readings from Last 3 Encounters:  05/24/17 160 lb (72.6 kg)  03/25/17 154 lb (69.9 kg)  02/18/17 160 lb (72.6 kg)       ASSESSMENT AND PLAN:  No diagnosis found.   Disposition:   F/U  6 months  No orders of the defined types were placed in this encounter.    Signed, Esmond Plants, M.D., Ph.D. 06/21/2017  [place], [place]

## 2017-06-22 ENCOUNTER — Ambulatory Visit: Payer: Medicare Other | Admitting: Cardiovascular Disease

## 2017-08-06 DIAGNOSIS — R079 Chest pain, unspecified: Secondary | ICD-10-CM | POA: Insufficient documentation

## 2017-08-06 DIAGNOSIS — G459 Transient cerebral ischemic attack, unspecified: Secondary | ICD-10-CM

## 2017-08-06 DIAGNOSIS — E1165 Type 2 diabetes mellitus with hyperglycemia: Secondary | ICD-10-CM | POA: Insufficient documentation

## 2017-08-06 DIAGNOSIS — I6350 Cerebral infarction due to unspecified occlusion or stenosis of unspecified cerebral artery: Secondary | ICD-10-CM | POA: Insufficient documentation

## 2017-08-06 DIAGNOSIS — E785 Hyperlipidemia, unspecified: Secondary | ICD-10-CM | POA: Insufficient documentation

## 2017-08-06 DIAGNOSIS — I10 Essential (primary) hypertension: Secondary | ICD-10-CM | POA: Insufficient documentation

## 2017-08-06 DIAGNOSIS — E119 Type 2 diabetes mellitus without complications: Secondary | ICD-10-CM | POA: Insufficient documentation

## 2017-08-06 HISTORY — DX: Transient cerebral ischemic attack, unspecified: G45.9

## 2017-08-06 NOTE — Progress Notes (Deleted)
Cardiology Office Note  Date:  08/06/2017   ID:  Kayla Malone, DOB 1961/12/09, MRN [id number]  PCP:  Patient, [name]   No chief complaint on file.   HPI:  Kayla Malone is a 56 y.o. female w/ a hx of HTN,  HL,  DM,  CVA x3 presenting with chest pain and facial swelling.   No PMD  In the ER 05/24/2017  frequent brief 2-3 min episodes of a substernal "dull stabbing" pain w/o assoc radiation, sob, palpitations, lightheadedness, syncope, n/v.  They self-resolve.    right side of her face and right lower lip were swollen.   No trauma, known insect bite, or known allergies.   No new medications or foods, or beauty products.   No drooling, sob, voice change.  Last meal 5pm with usual foods.The patient takes Lisinopril.    Last stress test > 12 months ago.  03/2017 emergency department  with 1 week of mild frontal headache as well as intermittent dizziness and a foul smell. She says that when she had her stroke 3 years ago that she had alterations of her smell which made her concerned that she may be having an additional stroke at this time. She says that she is also had increasing weakness over the past week to her left upper lower extremities. She was left week after her stroke but says that the weakness appears to be worsening.  CT ABD 02/2017 Carotid u/s 06/2011: normal  PMH:   has a past medical history of Asthma; Diabetes mellitus without complication ([place]); Hypertension; and Stroke [facility]  PSH:   No past surgical history on file.  Current Outpatient Prescriptions  Medication Sig Dispense Refill  . albuterol (PROVENTIL HFA;VENTOLIN HFA) 108 (90 Base) MCG/ACT inhaler Inhale 2 puffs into the lungs every 6 (six) hours as needed.    Marland Kitchen amLODipine (NORVASC) 5 MG tablet Take 5 mg by mouth daily.    Marland Kitchen aspirin EC 81 MG tablet Take 81 mg by mouth daily.    Marland Kitchen atorvastatin (LIPITOR) 40 MG tablet Take 40 mg by mouth daily.    Marland Kitchen buPROPion (ZYBAN) 150 MG 12  hr tablet Take 150 mg by mouth 2 (two) times daily.  5  . cyclobenzaprine (FLEXERIL) 5 MG tablet Take 1 tablet (5 mg total) by mouth 3 (three) times daily as needed for muscle spasms. 15 tablet 0  . diphenhydrAMINE (BENADRYL) 25 mg capsule Take 1 capsule (25 mg total) by mouth every 4 (four) hours as needed. 20 capsule 0  . famotidine (PEPCID) 40 MG tablet Take 1 tablet (40 mg total) by mouth every evening. 5 tablet 0  . fluticasone (FLONASE) 50 MCG/ACT nasal spray Place 2 sprays into both nostrils daily.    . Fluticasone-Salmeterol (ADVAIR DISKUS) 500-50 MCG/DOSE AEPB Inhale 1 puff into the lungs 2 (two) times daily.    . hydrochlorothiazide (HYDRODIURIL) 25 MG tablet Take 25 mg by mouth daily.    Marland Kitchen HYDROcodone-acetaminophen (NORCO/VICODIN) 5-325 MG tablet Take 1 tablet by mouth every 4 (four) hours as needed. 12 tablet 0  . ibuprofen (ADVIL,MOTRIN) 600 MG tablet Take 1 tablet (600 mg total) by mouth every 6 (six) hours as needed. 30 tablet 0  . meloxicam (MOBIC) 15 MG tablet Take 1 tablet (15 mg total) by mouth daily. 30 tablet 0  . metFORMIN (GLUCOPHAGE) 500 MG tablet Take 500 mg by mouth 2 (two) times daily.  5  . methocarbamol (ROBAXIN) 500 MG tablet Take 1 tablet (500 mg  total) by mouth 4 (four) times daily. 16 tablet 0  . nabumetone (RELAFEN) 750 MG tablet Take 1 tablet (750 mg total) by mouth 2 (two) times daily. 30 tablet 0  . oxyCODONE (OXY IR/ROXICODONE) 5 MG immediate release tablet Take 1 tablet by mouth every 4 (four) hours.    . predniSONE (DELTASONE) 20 MG tablet Take 3 tablets (60 mg total) by mouth daily. 15 tablet 0  . traZODone (DESYREL) 50 MG tablet Take 50 mg by mouth at bedtime.  5  . vitamin B-12 (CYANOCOBALAMIN) 1000 MCG tablet Take 1,000 mcg by mouth daily.     No current facility-administered medications for this visit.      Allergies:   Tramadol   Social History:  The patient  reports that she has never smoked. She has never used smokeless tobacco. She reports that  she does not drink alcohol or use drugs.   Family History:   family history is not on file.    Review of Systems: ROS   PHYSICAL EXAM: VS:  There were no vitals taken for this visit. , BMI There is no height or weight on file to calculate BMI. GEN: Well nourished, well developed, in no acute distress HEENT: normal Neck: no JVD, carotid bruits, or masses Cardiac: RRR; no murmurs, rubs, or gallops,no edema  Respiratory:  clear to auscultation bilaterally, normal work of breathing GI: soft, nontender, nondistended, + BS MS: no deformity or atrophy Skin: warm and dry, no rash Neuro:  Strength and sensation are intact Psych: euthymic mood, full affect    Recent Labs: 05/24/2017: BUN 9; Creatinine, Ser 0.89; Hemoglobin 12.9; Platelets 319; Potassium 3.0; Sodium 137    Lipid Panel No results found for: CHOL, HDL, LDLCALC, TRIG    Wt Readings from Last 3 Encounters:  05/24/17 160 lb (72.6 kg)  03/25/17 154 lb (69.9 kg)  02/18/17 160 lb (72.6 kg)       ASSESSMENT AND PLAN:  No diagnosis found.   Disposition:   F/U  6 months  No orders of the defined types were placed in this encounter.    Signed, Esmond Plants, M.D., Ph.D. 08/06/2017  [place], [place]

## 2017-08-07 ENCOUNTER — Ambulatory Visit: Payer: Medicare Other | Admitting: Cardiovascular Disease

## 2017-08-08 ENCOUNTER — Encounter: Payer: Self-pay | Admitting: *Deleted

## 2017-08-08 ENCOUNTER — Encounter: Payer: Self-pay | Admitting: Cardiovascular Disease

## 2017-08-15 ENCOUNTER — Ambulatory Visit
Admission: RE | Admit: 2017-08-15 | Discharge: 2017-08-15 | Disposition: A | Discharge status: Home / Self Care | Payer: Medicare Other | Source: Ambulatory Visit | Attending: Ophthalmology | Admitting: Ophthalmology

## 2017-08-15 ENCOUNTER — Ambulatory Visit: Payer: Medicare Other | Admitting: Anesthesiology

## 2017-08-15 ENCOUNTER — Encounter
Admission: RE | Disposition: A | Discharge status: Home / Self Care | Payer: Self-pay | Source: Ambulatory Visit | Attending: Ophthalmology

## 2017-08-15 ENCOUNTER — Encounter: Payer: Self-pay | Admitting: *Deleted

## 2017-08-15 DIAGNOSIS — Z87891 Personal history of nicotine dependence: Secondary | ICD-10-CM | POA: Insufficient documentation

## 2017-08-15 DIAGNOSIS — D571 Sickle-cell disease without crisis: Secondary | ICD-10-CM | POA: Diagnosis not present

## 2017-08-15 DIAGNOSIS — F329 Major depressive disorder, single episode, unspecified: Secondary | ICD-10-CM | POA: Diagnosis not present

## 2017-08-15 DIAGNOSIS — E119 Type 2 diabetes mellitus without complications: Secondary | ICD-10-CM | POA: Insufficient documentation

## 2017-08-15 DIAGNOSIS — Z8673 Personal history of transient ischemic attack (TIA), and cerebral infarction without residual deficits: Secondary | ICD-10-CM | POA: Insufficient documentation

## 2017-08-15 DIAGNOSIS — Z7982 Long term (current) use of aspirin: Secondary | ICD-10-CM | POA: Insufficient documentation

## 2017-08-15 DIAGNOSIS — Z885 Allergy status to narcotic agent status: Secondary | ICD-10-CM | POA: Diagnosis not present

## 2017-08-15 DIAGNOSIS — E7800 Pure hypercholesterolemia, unspecified: Secondary | ICD-10-CM | POA: Insufficient documentation

## 2017-08-15 DIAGNOSIS — H2512 Age-related nuclear cataract, left eye: Secondary | ICD-10-CM | POA: Insufficient documentation

## 2017-08-15 DIAGNOSIS — Z888 Allergy status to other drugs, medicaments and biological substances status: Secondary | ICD-10-CM | POA: Diagnosis not present

## 2017-08-15 DIAGNOSIS — Z79899 Other long term (current) drug therapy: Secondary | ICD-10-CM | POA: Diagnosis not present

## 2017-08-15 DIAGNOSIS — Z7984 Long term (current) use of oral hypoglycemic drugs: Secondary | ICD-10-CM | POA: Diagnosis not present

## 2017-08-15 DIAGNOSIS — J45909 Unspecified asthma, uncomplicated: Secondary | ICD-10-CM | POA: Insufficient documentation

## 2017-08-15 DIAGNOSIS — M2629 Other anomalies of dental arch relationship: Secondary | ICD-10-CM | POA: Diagnosis not present

## 2017-08-15 DIAGNOSIS — I10 Essential (primary) hypertension: Secondary | ICD-10-CM | POA: Insufficient documentation

## 2017-08-15 DIAGNOSIS — K219 Gastro-esophageal reflux disease without esophagitis: Secondary | ICD-10-CM | POA: Insufficient documentation

## 2017-08-15 HISTORY — DX: Pain, unspecified: R52

## 2017-08-15 HISTORY — DX: Depression, unspecified: F32.A

## 2017-08-15 HISTORY — DX: Personal history of other diseases of the digestive system: Z87.19

## 2017-08-15 HISTORY — DX: Nontoxic single thyroid nodule: E04.1

## 2017-08-15 HISTORY — DX: Major depressive disorder, single episode, unspecified: F32.9

## 2017-08-15 HISTORY — PX: CATARACT EXTRACTION W/PHACO: SHX586

## 2017-08-15 HISTORY — DX: Gastro-esophageal reflux disease without esophagitis: K21.9

## 2017-08-15 LAB — GLUCOSE, CAPILLARY: Glucose-Capillary: 122 mg/dL — ABNORMAL HIGH (ref 65–99)

## 2017-08-15 SURGERY — PHACOEMULSIFICATION, CATARACT, WITH IOL INSERTION
Anesthesia: Monitor Anesthesia Care | Site: Eye | Laterality: Left | Wound class: Clean

## 2017-08-15 MED ORDER — NA CHONDROIT SULF-NA HYALURON 40-17 MG/ML IO SOLN
INTRAOCULAR | Status: DC | PRN
  Administered 2017-08-15: 1 mL via INTRAOCULAR

## 2017-08-15 MED ORDER — FENTANYL CITRATE (PF) 100 MCG/2ML IJ SOLN
INTRAMUSCULAR | Status: DC | PRN
  Administered 2017-08-15 (×4): 25 ug via INTRAVENOUS

## 2017-08-15 MED ORDER — LIDOCAINE HCL (PF) 4 % IJ SOLN
INTRAOCULAR | Status: DC | PRN
  Administered 2017-08-15: 4 mL via OPHTHALMIC

## 2017-08-15 MED ORDER — BSS IO SOLN
INTRAOCULAR | Status: DC | PRN
  Administered 2017-08-15: 09:00:00 via OPHTHALMIC

## 2017-08-15 MED ORDER — MOXIFLOXACIN HCL 0.5 % OP SOLN
OPHTHALMIC | Status: AC
  Filled 2017-08-15: qty 3

## 2017-08-15 MED ORDER — LIDOCAINE HCL (PF) 4 % IJ SOLN
INTRAMUSCULAR | Status: AC
  Filled 2017-08-15: qty 5

## 2017-08-15 MED ORDER — ARMC OPHTHALMIC DILATING DROPS
OPHTHALMIC | Status: AC
  Administered 2017-08-15: 1 via OPHTHALMIC
  Filled 2017-08-15: qty 0.4

## 2017-08-15 MED ORDER — FENTANYL CITRATE (PF) 100 MCG/2ML IJ SOLN
INTRAMUSCULAR | Status: AC
  Filled 2017-08-15: qty 2

## 2017-08-15 MED ORDER — CARBACHOL 0.01 % IO SOLN
INTRAOCULAR | Status: DC | PRN
Start: 2017-08-15 — End: 2017-08-15
  Administered 2017-08-15: 0.5 mL via INTRAOCULAR

## 2017-08-15 MED ORDER — POVIDONE-IODINE 5 % OP SOLN
OPHTHALMIC | Status: DC | PRN
  Administered 2017-08-15: 1 via OPHTHALMIC

## 2017-08-15 MED ORDER — MIDAZOLAM HCL 2 MG/2ML IJ SOLN
INTRAMUSCULAR | Status: AC
  Filled 2017-08-15: qty 2

## 2017-08-15 MED ORDER — ARMC OPHTHALMIC DILATING DROPS
1.0000 "application " | OPHTHALMIC | Status: AC
  Administered 2017-08-15 (×3): 1 via OPHTHALMIC

## 2017-08-15 MED ORDER — MIDAZOLAM HCL 2 MG/2ML IJ SOLN
INTRAMUSCULAR | Status: DC | PRN
  Administered 2017-08-15: 2 mg via INTRAVENOUS

## 2017-08-15 MED ORDER — MOXIFLOXACIN HCL 0.5 % OP SOLN: 1.0000 [drp] | OPHTHALMIC | Status: DC | PRN

## 2017-08-15 MED ORDER — EPINEPHRINE PF 1 MG/ML IJ SOLN
INTRAMUSCULAR | Status: AC
  Filled 2017-08-15: qty 2

## 2017-08-15 MED ORDER — NA CHONDROIT SULF-NA HYALURON 40-17 MG/ML IO SOLN
INTRAOCULAR | Status: AC
  Filled 2017-08-15: qty 1

## 2017-08-15 MED ORDER — MOXIFLOXACIN HCL 0.5 % OP SOLN
OPHTHALMIC | Status: DC | PRN
  Administered 2017-08-15: 0.2 mL via OPHTHALMIC

## 2017-08-15 MED ORDER — SODIUM CHLORIDE 0.9 % IV SOLN
INTRAVENOUS | Status: DC
  Administered 2017-08-15: 07:00:00 via INTRAVENOUS

## 2017-08-15 MED ORDER — POVIDONE-IODINE 5 % OP SOLN
OPHTHALMIC | Status: AC
  Filled 2017-08-15: qty 30

## 2017-08-15 SURGICAL SUPPLY — 16 items
GLOVE BIO SURGEON STRL SZ8 (GLOVE) ×3 IMPLANT
GLOVE BIOGEL M 6.5 STRL (GLOVE) ×3 IMPLANT
GLOVE SURG LX 8.0 MICRO (GLOVE) ×2
GLOVE SURG LX STRL 8.0 MICRO (GLOVE) ×1 IMPLANT
GOWN STRL REUS W/ TWL LRG LVL3 (GOWN DISPOSABLE) ×2 IMPLANT
GOWN STRL REUS W/TWL LRG LVL3 (GOWN DISPOSABLE) ×4
LABEL CATARACT MEDS ST (LABEL) ×3 IMPLANT
LENS IOL TECNIS ITEC 23.5 (Intraocular Lens) ×3 IMPLANT
PACK CATARACT (MISCELLANEOUS) ×3 IMPLANT
PACK CATARACT BRASINGTON LX (MISCELLANEOUS) ×3 IMPLANT
PACK EYE AFTER SURG (MISCELLANEOUS) ×3 IMPLANT
SOL BSS BAG (MISCELLANEOUS) ×3
SOLUTION BSS BAG (MISCELLANEOUS) ×1 IMPLANT
SYR 5ML LL (SYRINGE) ×3 IMPLANT
WATER STERILE IRR 250ML POUR (IV SOLUTION) ×3 IMPLANT
WIPE NON LINTING 3.25X3.25 (MISCELLANEOUS) ×3 IMPLANT

## 2017-08-15 NOTE — H&P (Signed)
All labs reviewed. Abnormal studies sent to patients PCP when indicated.  Previous H&P reviewed, patient examined, there are NO CHANGES.  Kayla Malone LOUIS9/4/20188:27 AM

## 2017-08-15 NOTE — Discharge Instructions (Signed)
FOLLOW DR. PORFILIO'S POSTOP DISCHARGE INSTRUCTIONS/EYE DROP SCHEDULE AS REVIEWED.  Eye Surgery Discharge Instructions  Expect mild scratchy sensation or mild soreness. DO NOT RUB YOUR EYE!  The day of surgery:  Minimal physical activity, but bed rest is not required  No reading, computer work, or close hand work  No bending, lifting, or straining.  May watch TV  For 24 hours:  No driving, legal decisions, or alcoholic beverages  Safety precautions  Eat anything you prefer: It is better to start with liquids, then soup then solid foods.  _____ Eye patch should be worn until postoperative exam tomorrow.  ____ Solar shield eyeglasses should be worn for comfort in the sunlight/patch while sleeping  Resume all regular medications including aspirin or Coumadin if these were discontinued prior to surgery. You may shower, bathe, shave, or wash your hair. Tylenol may be taken for mild discomfort.  Call your doctor if you experience significant pain, nausea, or vomiting, fever > 101 or other signs of infection. [phone] or [phone] Specific instructions:  Follow-up Information    Birder Robson, MD Follow up.   Specialty:  Ophthalmology Why:  08/16/17 @ 10:25 am Contact information: [place] [address] [phone]

## 2017-08-15 NOTE — Anesthesia Postprocedure Evaluation (Signed)
Anesthesia Post Note  Patient: Kayla Malone  Procedure(s) Performed: Procedure(s) (LRB): CATARACT EXTRACTION PHACO AND INTRAOCULAR LENS PLACEMENT (IOC) (Left)  Patient location during evaluation: Short Stay Anesthesia Type: MAC Level of consciousness: awake and alert and oriented Pain management: pain level controlled Vital Signs Assessment: post-procedure vital signs reviewed and stable Respiratory status: spontaneous breathing and respiratory function stable Cardiovascular status: stable Postop Assessment: no headache and adequate PO intake Anesthetic complications: no     Last Vitals:  Vitals:   08/15/17 0634 08/15/17 0853  BP: (!) 152/78 (!) 151/84  Pulse: 63 66  Resp: 18   Temp: 36.9 C (!) 36.2 C  SpO2: 100% 100%    Last Pain:  Vitals:   08/15/17 0853  TempSrc: Filbert Berthold

## 2017-08-15 NOTE — Op Note (Signed)
PREOPERATIVE DIAGNOSIS:  Nuclear sclerotic cataract of the left eye.   POSTOPERATIVE DIAGNOSIS:  Nuclear sclerotic cataract of the left eye.   OPERATIVE PROCEDURE: Procedure(s): CATARACT EXTRACTION PHACO AND INTRAOCULAR LENS PLACEMENT (IOC)   SURGEON:  Birder Robson, MD.   ANESTHESIA:  Anesthesiologist: Gunnar Bulla, MD CRNA: Hedda Slade, CRNA; Jonna Clark, CRNA  1.      Managed anesthesia care. 2.     0.36ml of Shugarcaine was instilled following the paracentesis   COMPLICATIONS:  None.   TECHNIQUE:   Stop and chop   DESCRIPTION OF PROCEDURE:  The patient was examined and consented in the preoperative holding area where the aforementioned topical anesthesia was applied to the left eye and then brought back to the Operating Room where the left eye was prepped and draped in the usual sterile ophthalmic fashion and a lid speculum was placed. A paracentesis was created with the side port blade and the anterior chamber was filled with viscoelastic. A near clear corneal incision was performed with the steel keratome. A continuous curvilinear capsulorrhexis was performed with a cystotome followed by the capsulorrhexis forceps. Hydrodissection and hydrodelineation were carried out with BSS on a blunt cannula. The lens was removed in a stop and chop  technique and the remaining cortical material was removed with the irrigation-aspiration handpiece. The capsular bag was inflated with viscoelastic and the Technis [id number] lens was placed in the capsular bag without complication. The remaining viscoelastic was removed from the eye with the irrigation-aspiration handpiece. The wounds were hydrated. The anterior chamber was flushed with Miostat and the eye was inflated to physiologic pressure. 0.44ml Vigamox was placed in the anterior chamber. The wounds were found to be water tight. The eye was dressed with Vigamox. The patient was given protective glasses to wear throughout the day and a shield with  which to sleep tonight. The patient was also given drops with which to begin a drop regimen today and will follow-up with me in one day.  Implant Name Type Inv. Item Serial No. Manufacturer Lot No. LRB No. Used  LENS IOL DIOP 23.5 - [id number] Intraocular Lens LENS IOL DIOP 23.5 [phone] AMO   Left 1    Procedure(s) with comments: CATARACT EXTRACTION PHACO AND INTRAOCULAR LENS PLACEMENT (IOC) (Left) - [place] 00:40.6 AP% 7.3 CDE 2.98 FLUID PACK LOT # [id number]  Electronically signed: Shaniya Tashiro LOUIS 08/15/2017 8:52 AM

## 2017-08-15 NOTE — Transfer of Care (Signed)
Immediate Anesthesia Transfer of Care Note  Patient: Kayla Malone  Procedure(s) Performed: Procedure(s) with comments: CATARACT EXTRACTION PHACO AND INTRAOCULAR LENS PLACEMENT (IOC) (Left) - [place] 00:40.6 AP% 7.3 CDE 2.98 FLUID PACK LOT # [id number] h  Patient Location: PACU and Short Stay  Anesthesia Type:MAC  Level of Consciousness: awake, alert  and oriented  Airway & Oxygen Therapy: Patient Spontanous Breathing  Post-op Assessment: Report given to RN and Post -op Vital signs reviewed and stable  Post vital signs: Reviewed and stable  Last Vitals:  Vitals:   08/15/17 0634 08/15/17 0853  BP: (!) 152/78 (!) 151/84  Pulse: 63 66  Resp: 18   Temp: 36.9 C (!) 36.2 C  SpO2: 100% 100%    Last Pain:  Vitals:   08/15/17 0853  TempSrc: Temporal         Complications: No apparent anesthesia complications

## 2017-08-15 NOTE — Anesthesia Preprocedure Evaluation (Addendum)
Anesthesia Evaluation  Patient identified by MRN, date of birth, ID band Patient awake    Reviewed: Allergy & Precautions, NPO status , Patient's Chart, lab work & pertinent test results, reviewed documented beta blocker date and time   Airway Mallampati: II  TM Distance: >3 FB     Dental  (+) Chipped   Pulmonary           Cardiovascular hypertension, Pt. on medications      Neuro/Psych PSYCHIATRIC DISORDERS Depression CVA    GI/Hepatic hiatal hernia, GERD  Controlled,  Endo/Other  diabetes, Type 2  Renal/GU      Musculoskeletal   Abdominal   Peds  Hematology   Anesthesia Other Findings Overbite. L weakness. Memory and speech affected.  Reproductive/Obstetrics                            Anesthesia Physical Anesthesia Plan  ASA: III  Anesthesia Plan: MAC   Post-op Pain Management:    Induction:   PONV Risk Score and Plan:   Airway Management Planned:   Additional Equipment:   Intra-op Plan:   Post-operative Plan:   Informed Consent: I have reviewed the patients History and Physical, chart, labs and discussed the procedure including the risks, benefits and alternatives for the proposed anesthesia with the patient or authorized representative who has indicated his/her understanding and acceptance.     Plan Discussed with: CRNA  Anesthesia Plan Comments:         Anesthesia Quick Evaluation

## 2017-08-15 NOTE — Anesthesia Post-op Follow-up Note (Signed)
Anesthesia QCDR form completed.        

## 2017-08-16 ENCOUNTER — Encounter: Payer: Self-pay | Admitting: Ophthalmology

## 2017-09-15 DIAGNOSIS — E559 Vitamin D deficiency, unspecified: Secondary | ICD-10-CM | POA: Insufficient documentation

## 2017-09-15 DIAGNOSIS — E1165 Type 2 diabetes mellitus with hyperglycemia: Secondary | ICD-10-CM | POA: Insufficient documentation

## 2017-10-06 ENCOUNTER — Other Ambulatory Visit: Payer: Self-pay | Admitting: Cardiovascular Disease

## 2017-10-09 ENCOUNTER — Ambulatory Visit
Admission: RE | Admit: 2017-10-09 | Discharge: 2017-10-09 | Disposition: A | Discharge status: Home / Self Care | Payer: Medicare Other | Source: Ambulatory Visit | Attending: Cardiovascular Disease | Admitting: Cardiovascular Disease

## 2017-10-09 ENCOUNTER — Encounter
Admission: RE | Disposition: A | Discharge status: Home / Self Care | Payer: Self-pay | Source: Ambulatory Visit | Attending: Cardiovascular Disease

## 2017-10-09 ENCOUNTER — Encounter: Payer: Self-pay | Admitting: Cardiovascular Disease

## 2017-10-09 DIAGNOSIS — E1151 Type 2 diabetes mellitus with diabetic peripheral angiopathy without gangrene: Secondary | ICD-10-CM | POA: Diagnosis not present

## 2017-10-09 DIAGNOSIS — I2510 Atherosclerotic heart disease of native coronary artery without angina pectoris: Secondary | ICD-10-CM | POA: Insufficient documentation

## 2017-10-09 DIAGNOSIS — I10 Essential (primary) hypertension: Secondary | ICD-10-CM | POA: Diagnosis not present

## 2017-10-09 DIAGNOSIS — Z8673 Personal history of transient ischemic attack (TIA), and cerebral infarction without residual deficits: Secondary | ICD-10-CM | POA: Insufficient documentation

## 2017-10-09 DIAGNOSIS — E1165 Type 2 diabetes mellitus with hyperglycemia: Secondary | ICD-10-CM | POA: Insufficient documentation

## 2017-10-09 DIAGNOSIS — E559 Vitamin D deficiency, unspecified: Secondary | ICD-10-CM | POA: Diagnosis not present

## 2017-10-09 DIAGNOSIS — I252 Old myocardial infarction: Secondary | ICD-10-CM | POA: Diagnosis not present

## 2017-10-09 DIAGNOSIS — J45909 Unspecified asthma, uncomplicated: Secondary | ICD-10-CM | POA: Diagnosis not present

## 2017-10-09 DIAGNOSIS — Z7984 Long term (current) use of oral hypoglycemic drugs: Secondary | ICD-10-CM | POA: Insufficient documentation

## 2017-10-09 DIAGNOSIS — E785 Hyperlipidemia, unspecified: Secondary | ICD-10-CM | POA: Insufficient documentation

## 2017-10-09 DIAGNOSIS — I2584 Coronary atherosclerosis due to calcified coronary lesion: Secondary | ICD-10-CM | POA: Insufficient documentation

## 2017-10-09 DIAGNOSIS — Z87891 Personal history of nicotine dependence: Secondary | ICD-10-CM | POA: Insufficient documentation

## 2017-10-09 DIAGNOSIS — Z7982 Long term (current) use of aspirin: Secondary | ICD-10-CM | POA: Insufficient documentation

## 2017-10-09 HISTORY — PX: LEFT HEART CATH AND CORONARY ANGIOGRAPHY: CATH118249

## 2017-10-09 SURGERY — LEFT HEART CATH AND CORONARY ANGIOGRAPHY
Anesthesia: Moderate Sedation | Laterality: Left

## 2017-10-09 MED ORDER — LIDOCAINE HCL (PF) 1 % IJ SOLN
INTRAMUSCULAR | Status: AC
  Filled 2017-10-09: qty 30

## 2017-10-09 MED ORDER — ACETAMINOPHEN 325 MG PO TABS
650.0000 mg | ORAL_TABLET | ORAL | Status: DC | PRN
  Administered 2017-10-09: 650 mg via ORAL

## 2017-10-09 MED ORDER — SODIUM CHLORIDE 0.9 % WEIGHT BASED INFUSION
3.0000 mL/kg/h | INTRAVENOUS | Status: AC
  Administered 2017-10-09: 3 mL/kg/h via INTRAVENOUS

## 2017-10-09 MED ORDER — SODIUM CHLORIDE 0.9% FLUSH: 3.0000 mL | Freq: Two times a day (BID) | INTRAVENOUS | Status: DC

## 2017-10-09 MED ORDER — LABETALOL HCL 5 MG/ML IV SOLN
INTRAVENOUS | Status: DC | PRN
  Administered 2017-10-09: 20 mg via INTRAVENOUS

## 2017-10-09 MED ORDER — ACETAMINOPHEN 325 MG PO TABS
ORAL_TABLET | ORAL | Status: AC
  Filled 2017-10-09: qty 2

## 2017-10-09 MED ORDER — SODIUM CHLORIDE 0.9% FLUSH: 3.0000 mL | INTRAVENOUS | Status: DC | PRN

## 2017-10-09 MED ORDER — ONDANSETRON HCL 4 MG/2ML IJ SOLN: 4.0000 mg | Freq: Four times a day (QID) | INTRAMUSCULAR | Status: DC | PRN

## 2017-10-09 MED ORDER — ASPIRIN 81 MG PO CHEW
CHEWABLE_TABLET | ORAL | Status: AC
  Filled 2017-10-09: qty 1

## 2017-10-09 MED ORDER — IOPAMIDOL (ISOVUE-300) INJECTION 61%
INTRAVENOUS | Status: DC | PRN
  Administered 2017-10-09: 100 mL via INTRA_ARTERIAL

## 2017-10-09 MED ORDER — FENTANYL CITRATE (PF) 100 MCG/2ML IJ SOLN
INTRAMUSCULAR | Status: AC
  Filled 2017-10-09: qty 2

## 2017-10-09 MED ORDER — SODIUM CHLORIDE 0.9 % IV SOLN: 250.0000 mL | INTRAVENOUS | Status: DC | PRN

## 2017-10-09 MED ORDER — FENTANYL CITRATE (PF) 100 MCG/2ML IJ SOLN
INTRAMUSCULAR | Status: DC | PRN
  Administered 2017-10-09 (×3): 25 ug via INTRAVENOUS

## 2017-10-09 MED ORDER — MIDAZOLAM HCL 2 MG/2ML IJ SOLN
INTRAMUSCULAR | Status: DC | PRN
Start: 2017-10-09 — End: 2017-10-09
  Administered 2017-10-09 (×2): 1 mg via INTRAVENOUS

## 2017-10-09 MED ORDER — LABETALOL HCL 5 MG/ML IV SOLN
INTRAVENOUS | Status: AC
  Filled 2017-10-09: qty 4

## 2017-10-09 MED ORDER — ASPIRIN 81 MG PO CHEW
81.0000 mg | CHEWABLE_TABLET | ORAL | Status: AC
  Administered 2017-10-09: 81 mg via ORAL

## 2017-10-09 MED ORDER — MIDAZOLAM HCL 2 MG/2ML IJ SOLN
INTRAMUSCULAR | Status: AC
  Filled 2017-10-09: qty 2

## 2017-10-09 MED ORDER — SODIUM CHLORIDE 0.9 % WEIGHT BASED INFUSION: 1.0000 mL/kg/h | INTRAVENOUS | Status: DC

## 2017-10-09 MED ORDER — HEPARIN (PORCINE) IN NACL 2-0.9 UNIT/ML-% IJ SOLN
INTRAMUSCULAR | Status: AC
  Filled 2017-10-09: qty 500

## 2017-10-09 SURGICAL SUPPLY — 9 items
CATH 5FR JR4 DIAGNOSTIC (CATHETERS) ×3 IMPLANT
CATH INFINITI 5FR ANG PIGTAIL (CATHETERS) ×3 IMPLANT
CATH INFINITI 5FR JL4 (CATHETERS) ×3 IMPLANT
DEVICE CLOSURE MYNXGRIP 5F (Vascular Products) ×3 IMPLANT
KIT MANI 3VAL PERCEP (MISCELLANEOUS) ×3 IMPLANT
NEEDLE PERC 18GX7CM (NEEDLE) ×3 IMPLANT
PACK CARDIAC CATH (CUSTOM PROCEDURE TRAY) ×3 IMPLANT
SHEATH PINNACLE 5F 10CM (SHEATH) ×3 IMPLANT
WIRE EMERALD 3MM-J .035X150CM (WIRE) ×3 IMPLANT

## 2017-10-09 NOTE — Discharge Instructions (Signed)
Groin Insertion Instructions-If you lose feeling or develop tingling or pain in your leg or foot after the procedure, please walk around first.  If the discomfort does not improve , contact your physician and proceed to the nearest emergency room.  Loss of feeling in your leg might mean that a blockage has formed in the artery and this can be appropriately treated.  Limit your activity for the next two days after your procedure.  Avoid stooping, bending, heavy lifting or exertion as this may put pressure on the insertion site.  Resume normal activities in 48 hours.  You may shower after 24 hours but avoid excessive warm water and do not scrub the site.  Remove clear dressing in 48 hours.  If you have had a closure device inserted, do not soak in a tub bath or a hot tub for at least one week. ° °No driving for 48 hours after discharge.  After the procedure, check the insertion site occasionally.  If any oozing occurs or there is apparent swelling, firm pressure over the site will prevent a bruise from forming.  You can not hurt anything by pressing directly on the site.  The pressure stops the bleeding by allowing a small clot to form.  If the bleeding continues after the pressure has been applied for more than 15 minutes, call 911 or go to the nearest emergency room.   ° °The x-ray dye causes you to pass a considerate amount of urine.  For this reason, you will be asked to drink plenty of liquids after the procedure to prevent dehydration.  You may resume you regular diet.  Avoid caffeine products.   ° °For pain at the site of your procedure, take non-aspirin medicines such as Tylenol. ° °Medications: A. Hold Metformin for 48 hours if applicable.  B. Continue taking all your present medications at home unless your doctor prescribes any changes. ° °Moderate Conscious Sedation, Adult, Care After °These instructions provide you with information about caring for yourself after your procedure. Your health care provider  may also give you more specific instructions. Your treatment has been planned according to current medical practices, but problems sometimes occur. Call your health care provider if you have any problems or questions after your procedure. °What can I expect after the procedure? °After your procedure, it is common: °· To feel sleepy for several hours. °· To feel clumsy and have poor balance for several hours. °· To have poor judgment for several hours. °· To vomit if you eat too soon. ° °Follow these instructions at home: °For at least 24 hours after the procedure: ° °· Do not: °? Participate in activities where you could fall or become injured. °? Drive. °? Use heavy machinery. °? Drink alcohol. °? Take sleeping pills or medicines that cause drowsiness. °? Make important decisions or sign legal documents. °? Take care of children on your own. °· Rest. °Eating and drinking °· Follow the diet recommended by your health care provider. °· If you vomit: °? Drink water, juice, or soup when you can drink without vomiting. °? Make sure you have little or no nausea before eating solid foods. °General instructions °· Have a responsible adult stay with you until you are awake and alert. °· Take over-the-counter and prescription medicines only as told by your health care provider. °· If you smoke, do not smoke without supervision. °· Keep all follow-up visits as told by your health care provider. This is important. °Contact a health care provider   if: °· You keep feeling nauseous or you keep vomiting. °· You feel light-headed. °· You develop a rash. °· You have a fever. °Get help right away if: °· You have trouble breathing. °This information is not intended to replace advice given to you by your health care provider. Make sure you discuss any questions you have with your health care provider. °Document Released: 09/18/2013 Document Revised: 05/02/2016 Document Reviewed: 03/19/2016 °Elsevier Interactive Patient Education © 2018  [employer] ° °

## 2017-10-09 NOTE — Progress Notes (Signed)
Patient remains clinically stable post procedure, no bleeding nor hematoma at right groin site. Discharge instructions given along with return appointment to patient/husband with questions answered.

## 2017-10-10 ENCOUNTER — Ambulatory Visit: Admission: RE | Admit: 2017-10-10 | Payer: Medicare Other | Source: Ambulatory Visit | Admitting: Cardiovascular Disease

## 2017-10-10 ENCOUNTER — Encounter: Admission: RE | Payer: Self-pay | Source: Ambulatory Visit

## 2017-10-10 SURGERY — LEFT HEART CATH
Anesthesia: Moderate Sedation | Laterality: Right

## 2017-10-10 NOTE — OR Nursing (Signed)
Received call from patient saying she did not remember her d/c instructions and cannot find any d/c papers. Reviewed d/c instructions with patient; also notified will be receiving call from [facility] with an appointment and that the appointment looks like has been set up for nov 1 at [place]. Also gave the office address and phone number to her and told her they would be calling her. Patient said she had an appointment today at 1p with dr Humphrey Rolls because had some chest pain coming and going still; Notified [facility] office and was told Jenny Reichmann had tried to call patient but wrong number. Gave office patient's correct number of [phone]. Office stated Jenny Reichmann would call patient with details for appointment on Nov 1.

## 2017-10-12 ENCOUNTER — Encounter: Payer: Self-pay | Admitting: Cardiothoracic Surgery

## 2017-10-12 ENCOUNTER — Other Ambulatory Visit: Payer: Self-pay

## 2017-10-12 ENCOUNTER — Institutional Professional Consult (permissible substitution) (INDEPENDENT_AMBULATORY_CARE_PROVIDER_SITE_OTHER): Payer: Medicare Other | Admitting: Cardiothoracic Surgery

## 2017-10-12 VITALS — BP 133/78 | HR 64 | Ht 63.0 in | Wt 158.0 lb

## 2017-10-12 DIAGNOSIS — I2510 Atherosclerotic heart disease of native coronary artery without angina pectoris: Secondary | ICD-10-CM

## 2017-10-12 DIAGNOSIS — I209 Angina pectoris, unspecified: Secondary | ICD-10-CM | POA: Diagnosis not present

## 2017-10-12 DIAGNOSIS — E1159 Type 2 diabetes mellitus with other circulatory complications: Secondary | ICD-10-CM

## 2017-10-12 NOTE — Progress Notes (Signed)
[address]       [place] [address]             [phone]                    Kayla Malone [place] Medical Record #[id number] Date of Birth: 05-Jul-1961  Referring: Dionisio [name], MD Primary Care: Patient, [name]  Chief Complaint:    Chief Complaint  Patient presents with  . New Patient (Initial Visit)    Eval CABG, cath 10/09/2017    History of Present Illness:    Kayla Malone 56 y.o. female is seen in the office  today for evaluation of chest pain and review of recent cardiac catheterization to make recommendations concerning possible coronary artery bypass grafting.  Patient was seen in June 2018 after [place] emergency room for chest pain she was given steroids but no definitive follow-up with cardiology.  She was recently seen by Dr. Yancey Flemings, she was noted to have an abnormal EKG and subsequently underwent cardiac stress testing and echocardiogram with positive results and was referred for cardiac catheterization.  Patient has known long-standing diabetes.  Previous stroke in Oct 2016 with left-sided weakness and speech difficulty   Patient continues to have episodes of dizziness, and chest tightness.    Current Activity/ Functional Status:  Patient is independent with mobility/ambulation, transfers, ADL's, IADL's.   Zubrod Score:  At the time of surgery this patient's most appropriate activity status/level should be described as: []     0    Normal activity, no symptoms [x]     1    Restricted in physical strenuous activity but ambulatory, able to do out light work []     2    Ambulatory and capable of self care, unable to do work activities, up and about               >50 % of waking hours                              []     3    Only limited self care, in bed greater than 50% of waking hours []     4    Completely disabled, no self care, confined to bed or chair []     5    Moribund Disability from stroke  Past Medical  History:  Diagnosis Date  . Asthma   . Depression   . Diabetes mellitus without complication ([place])   . GERD (gastroesophageal reflux disease)   . History of hiatal hernia   . Hypertension   . Pain    BACK  . Stroke ([place])    x3  . Thyroid cyst     Past Surgical History:  Procedure Laterality Date  . ABDOMINAL HYSTERECTOMY    . CATARACT EXTRACTION W/PHACO Left 08/15/2017   Procedure: CATARACT EXTRACTION PHACO AND INTRAOCULAR LENS PLACEMENT (IOC);  Surgeon: Birder Robson, MD;  Location: [facility] ORS;  Service: Ophthalmology;  Laterality: Left;  [place] 00:40.6 AP% 7.3 CDE 2.98 FLUID PACK LOT # [id number] h  . EYE SURGERY    . LEFT HEART CATH AND CORONARY ANGIOGRAPHY Left 10/09/2017   Procedure: LEFT HEART CATH AND CORONARY ANGIOGRAPHY;  Surgeon: Dionisio [name], MD;  Location: [facility];  Service: Cardiovascular;  Laterality: Left;    No family history on file. COPD Brother    Coronary Artery Disease (Blocked  arteries around heart) Brother    Cirrhosis Father    Coronary Artery Disease (Blocked arteries around heart) Father    Diabetes type II Father    High blood pressure (Hypertension) Father    Kidney disease Father    No Known Problems Maternal Aunt    No Known Problems Maternal Grandfather    No Known Problems Maternal Grandmother    No Known Problems Maternal Uncle    Heart failure Mother    Pulmonary embolism Mother    No Known Problems Paternal Aunt    No Known Problems Paternal Grandfather    No Known Problems Paternal Grandmother    No Known Problems Paternal Uncle    Coronary Artery Disease (Blocked arteries around heart) Sister    Stroke Sister    Allergies Neg Hx    Anesthesia problems Neg Hx    Asthma Neg Hx    Cancer Neg Hx    Clotting disorder Neg Hx    Diabetes Neg Hx    Hearing loss Neg Hx    Heart disease Neg Hx    Neurological disorder Neg Hx    Thyroid disease Neg Hx      Social  History   Social History  . Marital status: Single    Spouse name: N/A  . Number of children: N/A  . Years of education: N/A   Occupational History  . Not on file.   Social History Main Topics  . Smoking status: Never Smoker  . Smokeless tobacco: Never Used  . Alcohol use No  . Drug use: No  . Sexual activity: Not on file   Other Topics Concern  . Not on file   Social History Narrative  . No narrative on file    History  Smoking Status  . Never Smoker  Smokeless Tobacco  . Never Used    History  Alcohol Use No     Allergies  Allergen Reactions  . Lisinopril   . Tramadol Other (See Comments)    sleepiness    Current Outpatient Prescriptions  Medication Sig Dispense Refill  . albuterol (PROVENTIL HFA;VENTOLIN HFA) 108 (90 Base) MCG/ACT inhaler Inhale 2 puffs into the lungs every 6 (six) hours as needed.    [place] Kitchen amLODipine (NORVASC) 5 MG tablet Take 5 mg by mouth daily.    [place] Kitchen aspirin EC 81 MG tablet Take 81 mg by mouth daily.    [place] Kitchen atorvastatin (LIPITOR) 40 MG tablet Take 40 mg by mouth daily.    [place] Kitchen buPROPion (ZYBAN) 150 MG 12 hr tablet Take 150 mg by mouth 2 (two) times daily.  5  . fluticasone (FLONASE) 50 MCG/ACT nasal spray Place 2 sprays into both nostrils daily.    . Fluticasone-Salmeterol (ADVAIR DISKUS) 500-50 MCG/DOSE AEPB Inhale 1 puff into the lungs 2 (two) times daily.    [place] Kitchen ibuprofen (ADVIL,MOTRIN) 600 MG tablet Take 1 tablet (600 mg total) by mouth every 6 (six) hours as needed. 30 tablet 0  . metFORMIN (GLUCOPHAGE) 500 MG tablet Take 500 mg by mouth 3 (three) times daily.   5  . pantoprazole (PROTONIX) 40 MG tablet Take 40 mg by mouth daily.    . sitaGLIPtin (JANUVIA) 50 MG tablet Take 50 mg by mouth daily.    . traZODone (DESYREL) 50 MG tablet Take 50 mg by mouth at bedtime.  5  . vitamin B-12 (CYANOCOBALAMIN) 1000 MCG tablet Take 1,000 mcg by mouth daily.     No current facility-administered medications for this visit.  Pertinent items are noted  in HPI.   Review of Systems:     Cardiac Review of Systems: Y or N  Chest Pain [ Y   ]  Resting SOB [ N  ] Exertional SOB  [ Y]  Orthopnea [Y ]   Pedal Edema Aqua.Slicker   ]    Palpitations Aqua.Slicker  ] Syncope  [ N ]   Presyncope [ Y  ]  General Review of Systems: [Y] = yes [  ]=no Constitional: recent weight change Aqua.Slicker  ];  Wt loss over the last 3 months [   ] anorexia [  ]; fatigue [  ]; nausea [  ]; night sweats [  ]; fever [  ]; or chills [  ];          Dental: poor dentition[  ]; Last Dentist visit:   Eye : blurred vision [  ]; diplopia [   ]; vision changes [  ];  Amaurosis fugax[  ]; Resp: cough [  ];  wheezing[ N ];  hemoptysis[N  ]; shortness of breath[ Y ]; paroxysmal nocturnal dyspnea[  ]; dyspnea on exertion[  ]; or orthopnea[  ];  GI:  gallstones[  ], vomiting[  ];  dysphagia[  ]; melena[  ];  hematochezia [  ]; heartburn[  ];   Hx of  Colonoscopy[  ]; GU: kidney stones [  ]; hematuria[  ];   dysuria [  ];  nocturia[  ];  history of     obstruction [  ]; urinary frequency [  ]             Skin: rash, swelling[  ];, hair loss[  ];  peripheral edema[  ];  or itching[  ]; Musculosketetal: myalgias[  ];  joint swelling[  ];  joint erythema[  ];  joint pain[  ];  back pain[  ];  Heme/Lymph: bruising[  ];  bleeding[  ];  anemia[  ];  Neuro: TIA[ Y ];  headaches[  ];  stroke[Y  ];  vertigo[  ];  seizures[ N ];   paresthesias[  ];  difficulty walking[Y  ];  Psych:depression[  ]; anxiety[  ];  Endocrine: diabetes[Y  ];  thyroid dysfunction[N ];  Immunizations: Flu up to date [  ]; Pneumococcal up to date [  ];  Other:  Physical Exam: BP 133/78   Pulse 64   Ht 5\' 3"  (1.6 m)   Wt 158 lb (71.7 kg)   SpO2 98%   BMI 27.99 kg/m   PHYSICAL EXAMINATION: General appearance: alert, cooperative, appears older than stated age and no distress Head: Normocephalic, without obvious abnormality, atraumatic Neck: no adenopathy, no carotid bruit, no JVD, supple, symmetrical, trachea midline and thyroid not  enlarged, symmetric, no tenderness/mass/nodules Lymph nodes: Cervical, supraclavicular, and axillary nodes normal. Resp: clear to auscultation bilaterally Back: symmetric, no curvature. ROM normal. No CVA tenderness. Cardio: regular rate and rhythm, S1, S2 normal, no murmur, click, rub or gallop GI: soft, non-tender; bowel sounds normal; no masses,  no organomegaly Extremities: extremities normal, atraumatic, no cyanosis or edema and Homans sign is negative, no sign of DVT Neurologic: Gait: Patient has left leg and arm weakness but is able to ambulate without a cane  Diagnostic Studies & Laboratory data:     Recent Radiology Findings:   No results found.   I have independently reviewed the above radiologic studies.  Recent Lab Findings: Lab Results  Component Value Date   WBC 5.9  05/24/2017   HGB 12.9 05/24/2017   HCT 38.2 05/24/2017   PLT 319 05/24/2017   GLUCOSE 129 (H) 05/24/2017   ALT 12 (L) 05/20/2016   AST 18 05/20/2016   NA 137 05/24/2017   K 3.0 (L) 05/24/2017   CL 100 (L) 05/24/2017   CREATININE 0.89 05/24/2017   BUN 9 05/24/2017   CO2 29 05/24/2017    CATH: Procedures   LEFT HEART CATH AND CORONARY ANGIOGRAPHY  Conclusion     Ramus lesion, 45 %stenosed.  Prox LAD to Mid LAD lesion, 90 %stenosed.  Mid RCA lesion, 80 %stenosed.  Dist RCA lesion, 60 %stenosed.   LEFT MAIN AND PROXIMAL LAD AND MID RCA HIGH GRADE LESION WITH NORMAL LVEF. ADVISE CABG ASAP   Procedural Details/Technique   Technical Details Cardiac Catheterization Procedure Note  Name: TREASURE [facility] MRN: [id number] DOB: 07/10/1961  Procedure: Left Heart Cath  Medications: Sedation: IV Versed, & IV Fentanyl Contrast: Omnipaque  Procedural details: The right groin was prepped, draped, and anesthetized with 1% lidocaine. Using modified Seldinger technique, a sheath was introduced into the right femoral artery. Standard Judkins catheters were used for coronary angiography and  left ventriculography. Catheter exchanges were performed over a guidewire. There were no immediate procedural complications. The patient was transferred to the post catheterization recovery area for further monitoring.   KHAN,SHAUKAT A MD, [facility] 10/09/2017, 1:09 PM      Estimated blood loss <50 mL.  During this procedure the patient was administered the following to achieve and maintain moderate conscious sedation: Versed 1 mg, Fentanyl 50 mcg, while the patient's heart rate, blood pressure, and oxygen saturation were continuously monitored. The period of conscious sedation was 16 minutes, of which I was present face-to-face 100% of this time.    Coronary Findings   Dominance: Right  Left Anterior Descending  Prox LAD to Mid LAD lesion, 90% stenosed. The lesion is calcified.  Ramus Intermedius  Ramus lesion, 45% stenosed.  Right Coronary Artery  Mid RCA lesion, 80% stenosed.  Dist RCA lesion, 60% stenosed.  Wall Motion   LVEF 60 PERCENT        Coronary Diagrams   Diagnostic Diagram         I have independently reviewed the above  cath films and reviewed the findings with the  patient .  Outpatient echo and Dr. Lisette Abu office report notes normal LV function trace to mild pulmonary regurgitation mild mitral regurgitation mild pulmonary hypertension mild left ventricular hypertrophy with grade 2 diastolic dysfunction   Assessment / Plan:   Patient is diabetic with positive symptoms of ischemia, she has complex proximal LAD disease and right coronary disease, with her diabetes and significant coronary artery disease multivessel she would best be served by proceeding with coronary artery bypass graft.  Risks and options were discussed with the patient and her daughter in detail.  She is willing to proceed, we have tentatively plan for November 12.  The goals risks and alternatives of the planned surgical procedurehave been discussed with the patient in detail. The risks of the  procedure including death, infection, stroke, myocardial infarction, bleeding, blood transfusion have all been discussed specifically.  I have quoted Royetta Crochet Snooks a 4% of perioperative mortality and a complication rate as high as 40 %. The patient's questions have been answered.Dianelly Ferran Lesperance is willing  to proceed with the planned procedure.   I  spent 40 minutes counseling the patient face to face and 50% or more the  time  was spent in counseling and coordination of care. The total time spent in the appointment was 60 minutes.  Grace Isaac MD      [address] [place] [address] Office [phone]   Beeper [phone]  10/12/2017 9:31 AM

## 2017-10-12 NOTE — Patient Instructions (Addendum)
Do not take  ADVIL or Ibuprofen Stop Losartan (Hyzaar stop 36 hours before surgery   Coronary Artery Bypass Grafting Coronary artery bypass grafting (CABG) is a procedure to bypass or fix arteries of the heart (coronary arteries) that have become narrow or blocked. This narrowing is usually the result of a buildup of fatty deposits (plaques) in the walls of the vessels. The coronary arteries supply the heart with the oxygen and nutrients that it needs to pump blood to your body. In this procedure, a section of blood vessel from another part of the body (usually the chest, arm, or leg) is removed (harvested) and then inserted where it will allow blood to bypass the damaged part of the coronary artery. The harvested section of blood vessel is called the graft. Tell a health care provider about:  Any allergies you have.  All medicines you are taking or using, including steroids, blood thinners, vitamins, herbs, eye drops, creams, and over-the-counter medicines.  Any problems you or family members have had with anesthetic medicines.  Any blood disorders you have.  Any surgeries you have had.  Any medical conditions you have.  Whether you are pregnant or may be pregnant. What are the risks? Generally, this is a safe procedure. However, problems may occur, including:  Bleeding, which may require transfusions.  Infection.  Short term memory loss, confusion, and personality changes (cognitive dysfunction).  Pain at the surgical site.  Damage to other structures or organs.  Stroke.  Allergic reactions to medicines or dyes.  Heart attack during or after surgery.  Kidney failure.  There may be additional risks and complications depending on where the veins are harvested from in your body for the procedure. What happens before the procedure? Staying hydrated Follow instructions from your health care provider about hydration, which may include:  Up to 2 hours before the procedure -  you may continue to drink clear liquids, such as water, clear fruit juice, black coffee, and plain tea.  Eating and drinking restrictions Follow instructions from your health care provider about eating and drinking, which may include:  8 hours before the procedure - stop eating heavy meals or foods such as meat, fried foods, or fatty foods.  6 hours before the procedure - stop eating light meals or foods, such as toast or cereal.  6 hours before the procedure - stop drinking milk or drinks that contain milk.  2 hours before the procedure - stop drinking clear liquids.  Medicine  Take over-the-counter and prescription medicines only as told by your health care provider.  Ask your health care provider about: ? Changing or stopping your regular medicines. This is especially important if you are taking diabetes medicines or blood thinners. You may be asked to start new medicines and stop taking others. Do not stop medicines or adjust dosages on your own. ? Taking medicines such as aspirin and ibuprofen. These medicines can thin your blood. Do not take these medicines before your procedure if your health care provider instructs you not to.  You may be given antibiotic medicine to help prevent infection. General instructions  Ask your health care provider how your surgical site will be marked or identified.  You may be asked to shower with a germ-killing soap.  For 3-6 weeks before the procedure, try not to use any products that contain nicotine or tobacco, such as cigarettes and e-cigarettes. Quitting smoking is one of the best things you can do for your heart health. If you need help  quitting, ask your health care provider.  Talk with your health care provider about where graft will come from. What happens during the procedure?  To reduce your risk of infection: ? Your health care team will wash or sanitize their hands. ? Your skin will be washed with soap. ? Hair may be removed from  the surgical area.  An IV tube will be inserted into one of your veins.  You will be given one or more of the following: ? A medicine to help you relax (sedative). ? A medicine to make you fall asleep (general anesthetic).  A cut (incision) will be made down the front of the chest through the breastbone (sternum). The sternum will be spread open so your heart can be seen.  You may be placed on a heart-lung bypass machine. This machine will provide oxygen to your blood while the heart is undergoing surgery. Your surgeon may be able to do the surgery without the heart-lung bypass machine. That is called beating heart bypass surgery.  If a heart-lung bypass machine is needed, your heart will be temporarily stopped.  A section of blood vessel will be harvested from another part of your body (usually the chest, arm, or leg) and used to bypass the blocked arteries of your heart.  When the bypass is done, you will be taken off the heart-lung machine if it was used.  If your heart was stopped, it will be restarted and will take over again normally.  Your chest will be closed.  A bandage (dressing) will be placed over the incisions.  Tubes will remain in your chest and will be connected to a suction device to help drain fluid and reinflate the lungs. The procedure may vary among health care providers and hospitals. What happens after the procedure?  Your blood pressure, heart rate, breathing rate, and blood oxygen level will be monitored until the medicines you were given have worn off.  You may wake up with a tube in your throat to help your breathing. You may be connected to a breathing machine. You will not be able to talk while the tube is in place. The tube will be taken out as soon as it is safe.  You will be groggy and may have some pain. You will be given pain medicine to help control the pain.  You will be shown how to do deep breathing exercises. Summary  In this procedure, a  section of blood vessel from another part of the body (usually the chest, arm, or leg) is removed (harvested) and then inserted where it will allow blood to bypass the damaged part of the coronary artery. The harvested section of blood vessel is called the graft.  For 3-6 weeks before the procedure, try not to use any products that contain nicotine or tobacco, such as cigarettes and e-cigarettes. Quitting smoking is one of the best things you can do for your heart health. If you need help quitting, ask your health care provider.  You may be placed on a heart-lung bypass machine during the surgery. This machine will provide oxygen to your blood while the heart is undergoing surgery. Your surgeon may be able to do the surgery without the heart-lung bypass machine. That is called beating heart bypass surgery.  You may wake up with a tube in your throat to help your breathing. You may be connected to a breathing machine. You will not be able to talk while the tube is in place. The tube will  be taken out as soon as it is safe. This information is not intended to replace advice given to you by your health care provider. Make sure you discuss any questions you have with your health care provider. Document Released: 09/07/2005 Document Revised: 10/17/2016 Document Reviewed: 10/17/2016 Elsevier Interactive Patient Education  2018 [employer].   Coronary Artery Bypass Grafting, Care After This sheet gives you information about how to care for yourself after your procedure. Your health care provider may also give you more specific instructions. If you have problems or questions, contact your health care provider. What can I expect after the procedure? After the procedure, it is common to have:  Nausea and a lack of appetite.  Constipation.  Weakness and fatigue.  Depression or irritability.  Pain or discomfort in your incision areas.  Follow these instructions at home: Medicines  Take  over-the-counter and prescription medicines only as told by your health care provider. Do not stop taking medicines or start any new medicines without approval from your health care provider.  If you were prescribed an antibiotic medicine, take it as told by your health care provider. Do not stop taking the antibiotic even if you start to feel better.  Do not drive or use heavy machinery while taking prescription pain medicine. Incision care  Follow instructions from your health care provider about how to take care of your incisions. Make sure you: ? Wash your hands with soap and water before you change your bandage (dressing). If soap and water are not available, use hand sanitizer. ? Change your dressing as told by your health care provider. ? Leave stitches (sutures), skin glue, or adhesive strips in place. These skin closures may need to stay in place for 2 weeks or longer. If adhesive strip edges start to loosen and curl up, you may trim the loose edges. Do not remove adhesive strips completely unless your health care provider tells you to do that.  Keep incision areas clean, dry, and protected.  Check your incision areas every day for signs of infection. Check for: ? More redness, swelling, or pain. ? More fluid or blood. ? Warmth. ? Pus or a bad smell.  If incisions were made in your legs: ? Avoid crossing your legs. ? Avoid sitting for long periods of time. Change positions every 30 minutes. ? Raise (elevate) your legs when you are sitting. Bathing  Do not take baths, swim, or use a hot tub until your health care provider approves.  Only take sponge baths. Pat the incisions dry. Do not rub incisions with a washcloth or towel.  Ask your health care provider when you can shower. Eating and drinking  Eat foods that are high in fiber, such as raw fruits and vegetables, whole grains, beans, and nuts. Meats should be lean cut. Avoid canned, processed, and fried foods. This can help  prevent constipation and is a recommended part of a heart-healthy diet.  Drink enough fluid to keep your urine clear or pale yellow.  Limit alcohol intake to no more than 1 drink a day for nonpregnant women and 2 drinks a day for men. One drink equals 12 oz of beer, 5 oz of wine, or 1 oz of hard liquor. Activity  Rest and limit your activity as told by your health care provider. You may be instructed to: ? Stop any activity right away if you have chest pain, shortness of breath, irregular heartbeats, or dizziness. Get help right away if you have any of  these symptoms. ? Move around frequently for short periods or take short walks as directed by your health care provider. Gradually increase your activities. You may need physical therapy or cardiac rehabilitation to help strengthen your muscles and build your endurance. ? Avoid lifting, pushing, or pulling anything that is heavier than 10 lb (4.5 kg) for at least 6 weeks or as told by your health care provider.  Do not drive until your health care provider approves.  Ask your health care provider when you may return to work.  Ask your health care provider when you may resume sexual activity. General instructions  Do not use any products that contain nicotine or tobacco, such as cigarettes and e-cigarettes. If you need help quitting, ask your health care provider.  Take 2-3 deep breaths every few hours during the day, while you recover. This helps expand your lungs and prevent complications like pneumonia after surgery.  If you were given a device called an incentive spirometer, use it several times a day to practice deep breathing. Support your chest with a pillow or your arms when you take deep breaths or cough.  Wear compression stockings as told by your health care provider. These stockings help to prevent blood clots and reduce swelling in your legs.  Weigh yourself every day. This helps identify if your body is holding (retaining)  fluid that may make your heart and lungs work harder.  Keep all follow-up visits as told by your health care provider. This is important. Contact a health care provider if:  You have more redness, swelling, or pain around any incision.  You have more fluid or blood coming from any incision.  Any incision feels warm to the touch.  You have pus or a bad smell coming from any incision  You have a fever.  You have swelling in your ankles or legs.  You have pain in your legs.  You gain 2 lb (0.9 kg) or more a day.  You are nauseous or you vomit.  You have diarrhea. Get help right away if:  You have chest pain that spreads to your jaw or arms.  You are short of breath.  You have a fast or irregular heartbeat.  You notice a "clicking" in your breastbone (sternum) when you move.  You have numbness or weakness in your arms or legs.  You feel dizzy or light-headed. Summary  After the procedure, it is common to have pain or discomfort in the incision areas.  Do not take baths, swim, or use a hot tub until your health care provider approves.  Gradually increase your activities. You may need physical therapy or cardiac rehabilitation to help strengthen your muscles and build your endurance.  Weigh yourself every day. This helps identify if your body is holding (retaining) fluid that may make your heart and lungs work harder. This information is not intended to replace advice given to you by your health care provider. Make sure you discuss any questions you have with your health care provider. Document Released: 06/17/2005 Document Revised: 10/17/2016 Document Reviewed: 10/17/2016 Elsevier Interactive Patient Education  [employer].

## 2017-10-19 NOTE — Pre-Procedure Instructions (Signed)
Kayla Malone  10/19/2017      [id number] - [place], [place] - [address] [place] [address] Phone: [phone] Fax: [phone]    Your procedure is scheduled on Monday November 12.  Report to [facility] Admitting at 5:30 A.M.  Call this number if you have problems the morning of surgery:  [phone]   Remember:  Do not eat food or drink liquids after midnight.  Take these medicines the morning of surgery with A SIP OF WATER:  Isosorbide (isordil) Metoprolol (Toprol-XL) Pantoprazole (protonix)Albuterol inhaler if needed Bupropion (Zyban) Advair Flonase  7 days prior to surgery STOP taking any nabumetone (relafen), Aleve, Naproxen, Ibuprofen, Motrin, Advil, Goody's, BC's, all herbal medications, fish oil, and all vitamins  FOLLOW MD's instructions on stopping Aspirin  DO NOT TAKE metformin (glucophage) or sitagliptan (Januvia) the day of surgery     How to Manage Your Diabetes Before and After Surgery  Why is it important to control my blood sugar before and after surgery? . Improving blood sugar levels before and after surgery helps healing and can limit problems. . A way of improving blood sugar control is eating a healthy diet by: o  Eating less sugar and carbohydrates o  Increasing activity/exercise o  Talking with your doctor about reaching your blood sugar goals . High blood sugars (greater than 180 mg/dL) can raise your risk of infections and slow your recovery, so you will need to focus on controlling your diabetes during the weeks before surgery. . Make sure that the doctor who takes care of your diabetes knows about your planned surgery including the date and location.  How do I manage my blood sugar before surgery? . Check your blood sugar at least 4 times a day, starting 2 days before surgery, to make sure that the level is not too high or low. o Check your blood sugar the morning of your surgery when  you wake up and every 2 hours until you get to the Short Stay unit. . If your blood sugar is less than 70 mg/dL, you will need to treat for low blood sugar: o Do not take insulin. o Treat a low blood sugar (less than 70 mg/dL) with  cup of clear juice (cranberry or apple), 4 glucose tablets, OR glucose gel. Recheck blood sugar in 15 minutes after treatment (to make sure it is greater than 70 mg/dL). If your blood sugar is not greater than 70 mg/dL on recheck, call [phone] o  for further instructions. . Report your blood sugar to the short stay nurse when you get to Short Stay.  . If you are admitted to the hospital after surgery: o Your blood sugar will be checked by the staff and you will probably be given insulin after surgery (instead of oral diabetes medicines) to make sure you have good blood sugar levels. o The goal for blood sugar control after surgery is 80-180 mg/dL.                 Do not wear jewelry, make-up or nail polish.  Do not wear lotions, powders, or perfumes, or deoderant.  Do not shave 48 hours prior to surgery.  Men may shave face and neck.  Do not bring valuables to the hospital.  [facility] is not responsible for any belongings or valuables.  Contacts, dentures or bridgework may not be worn into surgery.  Leave your suitcase in the car.  After  surgery it may be brought to your room.  For patients admitted to the hospital, discharge time will be determined by your treatment team.  Patients discharged the day of surgery will not be allowed to drive home.    Special instructions:   [place]- Preparing For Surgery  Before surgery, you can play an important role. Because skin is not sterile, your skin needs to be as free of germs as possible. You can reduce the number of germs on your skin by washing with CHG (chlorahexidine gluconate) Soap before surgery.  CHG is an antiseptic cleaner which kills germs and bonds with the skin to continue killing  germs even after washing.  Please do not use if you have an allergy to CHG or antibacterial soaps. If your skin becomes reddened/irritated stop using the CHG.  Do not shave (including legs and underarms) for at least 48 hours prior to first CHG shower. It is OK to shave your face.  Please follow these instructions carefully.   1. Shower the NIGHT BEFORE SURGERY and the MORNING OF SURGERY with CHG.   2. If you chose to wash your hair, wash your hair first as usual with your normal shampoo.  3. After you shampoo, rinse your hair and body thoroughly to remove the shampoo.  4. Use CHG as you would any other liquid soap. You can apply CHG directly to the skin and wash gently with a scrungie or a clean washcloth.   5. Apply the CHG Soap to your body ONLY FROM THE NECK DOWN.  Do not use on open wounds or open sores. Avoid contact with your eyes, ears, mouth and genitals (private parts). Wash Face and genitals (private parts)  with your normal soap.  6. Wash thoroughly, paying special attention to the area where your surgery will be performed.  7. Thoroughly rinse your body with warm water from the neck down.  8. DO NOT shower/wash with your normal soap after using and rinsing off the CHG Soap.  9. Pat yourself dry with a CLEAN TOWEL.  10. Wear CLEAN PAJAMAS to bed the night before surgery, wear comfortable clothes the morning of surgery  11. Place CLEAN SHEETS on your bed the night of your first shower and DO NOT SLEEP WITH PETS.    Day of Surgery: Do not apply any deodorants/lotions. Please wear clean clothes to the hospital/surgery center.      Please read over the following fact sheets that you were given. Coughing and Deep Breathing, MRSA Information and Surgical Site Infection Prevention

## 2017-10-20 ENCOUNTER — Other Ambulatory Visit (HOSPITAL_COMMUNITY): Payer: Medicare Other

## 2017-10-20 ENCOUNTER — Inpatient Hospital Stay (HOSPITAL_COMMUNITY)
Admission: RE | Admit: 2017-10-20 | Discharge: 2017-10-20 | Disposition: A | Discharge status: Home / Self Care | Payer: Medicare Other | Source: Ambulatory Visit

## 2017-10-20 ENCOUNTER — Inpatient Hospital Stay (HOSPITAL_COMMUNITY)
Admission: RE | Admit: 2017-10-20 | Discharge: 2017-10-20 | Disposition: A | Discharge status: Home / Self Care | Payer: Medicare Other | Source: Ambulatory Visit | Attending: Cardiothoracic Surgery | Admitting: Cardiothoracic Surgery

## 2017-10-20 ENCOUNTER — Emergency Department (HOSPITAL_COMMUNITY): Payer: Medicare Other

## 2017-10-20 ENCOUNTER — Encounter (HOSPITAL_COMMUNITY): Payer: Self-pay | Admitting: *Deleted

## 2017-10-20 ENCOUNTER — Inpatient Hospital Stay (HOSPITAL_COMMUNITY)
Admit: 2017-10-20 | Discharge: 2017-10-20 | Disposition: A | Discharge status: Home / Self Care | Payer: Medicare Other | Attending: Cardiology | Admitting: Cardiology

## 2017-10-20 ENCOUNTER — Inpatient Hospital Stay (HOSPITAL_COMMUNITY)
Admission: EM | Admit: 2017-10-20 | Discharge: 2017-10-28 | DRG: 236 | Disposition: A | Discharge status: Home Health | Payer: Medicare Other | Attending: Cardiothoracic Surgery | Admitting: Cardiothoracic Surgery

## 2017-10-20 ENCOUNTER — Ambulatory Visit (HOSPITAL_COMMUNITY)
Admission: RE | Admit: 2017-10-20 | Discharge: 2017-10-20 | Disposition: A | Discharge status: Home / Self Care | Payer: Medicare Other | Source: Ambulatory Visit | Attending: Cardiothoracic Surgery | Admitting: Cardiothoracic Surgery

## 2017-10-20 ENCOUNTER — Encounter (HOSPITAL_COMMUNITY): Payer: Self-pay

## 2017-10-20 ENCOUNTER — Other Ambulatory Visit: Payer: Self-pay

## 2017-10-20 ENCOUNTER — Ambulatory Visit (HOSPITAL_BASED_OUTPATIENT_CLINIC_OR_DEPARTMENT_OTHER)
Admission: RE | Admit: 2017-10-20 | Discharge: 2017-10-20 | Disposition: A | Discharge status: Home / Self Care | Payer: Medicare Other | Source: Ambulatory Visit | Attending: Cardiothoracic Surgery | Admitting: Cardiothoracic Surgery

## 2017-10-20 DIAGNOSIS — I2510 Atherosclerotic heart disease of native coronary artery without angina pectoris: Secondary | ICD-10-CM | POA: Insufficient documentation

## 2017-10-20 DIAGNOSIS — Z7982 Long term (current) use of aspirin: Secondary | ICD-10-CM | POA: Diagnosis not present

## 2017-10-20 DIAGNOSIS — I69354 Hemiplegia and hemiparesis following cerebral infarction affecting left non-dominant side: Secondary | ICD-10-CM | POA: Diagnosis not present

## 2017-10-20 DIAGNOSIS — I10 Essential (primary) hypertension: Secondary | ICD-10-CM | POA: Diagnosis present

## 2017-10-20 DIAGNOSIS — Z90710 Acquired absence of both cervix and uterus: Secondary | ICD-10-CM

## 2017-10-20 DIAGNOSIS — K5900 Constipation, unspecified: Secondary | ICD-10-CM | POA: Diagnosis present

## 2017-10-20 DIAGNOSIS — Z951 Presence of aortocoronary bypass graft: Secondary | ICD-10-CM

## 2017-10-20 DIAGNOSIS — E041 Nontoxic single thyroid nodule: Secondary | ICD-10-CM | POA: Diagnosis present

## 2017-10-20 DIAGNOSIS — E782 Mixed hyperlipidemia: Secondary | ICD-10-CM | POA: Diagnosis present

## 2017-10-20 DIAGNOSIS — I25110 Atherosclerotic heart disease of native coronary artery with unstable angina pectoris: Principal | ICD-10-CM | POA: Diagnosis present

## 2017-10-20 DIAGNOSIS — Z7984 Long term (current) use of oral hypoglycemic drugs: Secondary | ICD-10-CM

## 2017-10-20 DIAGNOSIS — I200 Unstable angina: Secondary | ICD-10-CM

## 2017-10-20 DIAGNOSIS — D62 Acute posthemorrhagic anemia: Secondary | ICD-10-CM | POA: Diagnosis not present

## 2017-10-20 DIAGNOSIS — F329 Major depressive disorder, single episode, unspecified: Secondary | ICD-10-CM | POA: Diagnosis present

## 2017-10-20 DIAGNOSIS — Z87891 Personal history of nicotine dependence: Secondary | ICD-10-CM | POA: Diagnosis not present

## 2017-10-20 DIAGNOSIS — I25118 Atherosclerotic heart disease of native coronary artery with other forms of angina pectoris: Secondary | ICD-10-CM | POA: Diagnosis present

## 2017-10-20 DIAGNOSIS — K449 Diaphragmatic hernia without obstruction or gangrene: Secondary | ICD-10-CM | POA: Diagnosis present

## 2017-10-20 DIAGNOSIS — E1151 Type 2 diabetes mellitus with diabetic peripheral angiopathy without gangrene: Secondary | ICD-10-CM | POA: Diagnosis present

## 2017-10-20 DIAGNOSIS — Z7951 Long term (current) use of inhaled steroids: Secondary | ICD-10-CM | POA: Diagnosis not present

## 2017-10-20 DIAGNOSIS — Z885 Allergy status to narcotic agent status: Secondary | ICD-10-CM

## 2017-10-20 DIAGNOSIS — Z79899 Other long term (current) drug therapy: Secondary | ICD-10-CM

## 2017-10-20 DIAGNOSIS — Z8249 Family history of ischemic heart disease and other diseases of the circulatory system: Secondary | ICD-10-CM | POA: Diagnosis not present

## 2017-10-20 DIAGNOSIS — Z888 Allergy status to other drugs, medicaments and biological substances status: Secondary | ICD-10-CM

## 2017-10-20 DIAGNOSIS — Z833 Family history of diabetes mellitus: Secondary | ICD-10-CM

## 2017-10-20 DIAGNOSIS — Z823 Family history of stroke: Secondary | ICD-10-CM | POA: Diagnosis not present

## 2017-10-20 DIAGNOSIS — Z09 Encounter for follow-up examination after completed treatment for conditions other than malignant neoplasm: Secondary | ICD-10-CM

## 2017-10-20 DIAGNOSIS — E8770 Fluid overload, unspecified: Secondary | ICD-10-CM | POA: Diagnosis not present

## 2017-10-20 DIAGNOSIS — Z01810 Encounter for preprocedural cardiovascular examination: Secondary | ICD-10-CM | POA: Diagnosis not present

## 2017-10-20 DIAGNOSIS — K219 Gastro-esophageal reflux disease without esophagitis: Secondary | ICD-10-CM | POA: Diagnosis present

## 2017-10-20 HISTORY — DX: Atherosclerotic heart disease of native coronary artery without angina pectoris: I25.10

## 2017-10-20 HISTORY — DX: Unstable angina: I20.0

## 2017-10-20 LAB — PULMONARY FUNCTION TEST
FEF 25-75 Post: 2.1 L/sec
FEF 25-75 Pre: 1.32 L/sec
FEF2575-%Change-Post: 59 %
FEF2575-%Pred-Post: 99 %
FEF2575-%Pred-Pre: 62 %
FEV1-%Change-Post: 34 %
FEV1-%Pred-Post: 82 %
FEV1-%Pred-Pre: 61 %
FEV1-Post: 1.71 L
FEV1-Pre: 1.27 L
FEV1FVC-%Change-Post: 4 %
FEV1FVC-%Pred-Pre: 100 %
FEV6-%Change-Post: 29 %
FEV6-%Pred-Post: 80 %
FEV6-%Pred-Pre: 62 %
FEV6-Post: 2.04 L
FEV6-Pre: 1.58 L
FEV6FVC-%Pred-Post: 103 %
FEV6FVC-%Pred-Pre: 103 %
FVC-%Change-Post: 29 %
FVC-%Pred-Post: 77 %
FVC-%Pred-Pre: 60 %
FVC-Post: 2.04 L
FVC-Pre: 1.58 L
Post FEV1/FVC ratio: 84 %
Post FEV6/FVC ratio: 100 %
Pre FEV1/FVC ratio: 80 %
Pre FEV6/FVC Ratio: 100 %

## 2017-10-20 LAB — CBC
HEMATOCRIT: 40.1 % (ref 36.0–46.0)
HEMOGLOBIN: 13.4 g/dL (ref 12.0–15.0)
MCH: 27.5 pg (ref 26.0–34.0)
MCHC: 33.4 g/dL (ref 30.0–36.0)
MCV: 82.3 fL (ref 78.0–100.0)
PLATELETS: 326 10*3/uL (ref 150–400)
RBC: 4.87 MIL/uL (ref 3.87–5.11)
RDW: 12.9 % (ref 11.5–15.5)
WBC: 6 10*3/uL (ref 4.0–10.5)

## 2017-10-20 LAB — BASIC METABOLIC PANEL
Anion gap: 9 (ref 5–15)
BUN: 7 mg/dL (ref 6–20)
CHLORIDE: 102 mmol/L (ref 101–111)
CO2: 26 mmol/L (ref 22–32)
CREATININE: 0.88 mg/dL (ref 0.44–1.00)
Calcium: 9.4 mg/dL (ref 8.9–10.3)
GFR calc non Af Amer: >60 mL/min (ref >60)
Glucose, Bld: 161 mg/dL — ABNORMAL HIGH (ref 65–99)
POTASSIUM: 3.4 mmol/L — AB (ref 3.5–5.1)
Sodium: 137 mmol/L (ref 135–145)

## 2017-10-20 LAB — GLUCOSE, CAPILLARY
GLUCOSE-CAPILLARY: 141 mg/dL — AB (ref 65–99)
Glucose-Capillary: 174 mg/dL — ABNORMAL HIGH (ref 65–99)

## 2017-10-20 LAB — TROPONIN I
Troponin I: <0.03 ng/mL (ref <0.03)
Troponin I: <0.03 ng/mL (ref <0.03)

## 2017-10-20 LAB — I-STAT TROPONIN, ED: Troponin i, poc: 0 ng/mL (ref 0.00–0.08)

## 2017-10-20 LAB — MRSA PCR SCREENING: MRSA by PCR: NEGATIVE

## 2017-10-20 LAB — HEPARIN LEVEL (UNFRACTIONATED): HEPARIN UNFRACTIONATED: 0.22 [IU]/mL — AB (ref 0.30–0.70)

## 2017-10-20 MED ORDER — ALBUTEROL SULFATE (2.5 MG/3ML) 0.083% IN NEBU
2.5000 mg | INHALATION_SOLUTION | Freq: Once | RESPIRATORY_TRACT | Status: AC
  Administered 2017-10-20: 2.5 mg via RESPIRATORY_TRACT

## 2017-10-20 MED ORDER — ACETAMINOPHEN 325 MG PO TABS
650.0000 mg | ORAL_TABLET | ORAL | Status: DC | PRN
Start: 2017-10-20 — End: 2017-10-23
  Administered 2017-10-21 – 2017-10-22 (×2): 650 mg via ORAL
  Filled 2017-10-20 (×2): qty 2

## 2017-10-20 MED ORDER — METOPROLOL SUCCINATE ER 50 MG PO TB24
50.0000 mg | ORAL_TABLET | Freq: Every day | ORAL | Status: DC
  Administered 2017-10-21 – 2017-10-22 (×2): 50 mg via ORAL
  Filled 2017-10-20 (×2): qty 1

## 2017-10-20 MED ORDER — NITROGLYCERIN IN D5W 200-5 MCG/ML-% IV SOLN
0.0000 ug/min | Freq: Once | INTRAVENOUS | Status: AC
  Administered 2017-10-20: 5 ug/min via INTRAVENOUS
  Filled 2017-10-20: qty 250

## 2017-10-20 MED ORDER — METFORMIN HCL 500 MG PO TABS
500.0000 mg | ORAL_TABLET | Freq: Three times a day (TID) | ORAL | Status: DC
  Administered 2017-10-20 – 2017-10-22 (×7): 500 mg via ORAL
  Filled 2017-10-20 (×7): qty 1

## 2017-10-20 MED ORDER — ONDANSETRON HCL 4 MG/2ML IJ SOLN: 4.0000 mg | Freq: Four times a day (QID) | INTRAMUSCULAR | Status: DC | PRN

## 2017-10-20 MED ORDER — HEPARIN (PORCINE) IN NACL 100-0.45 UNIT/ML-% IJ SOLN
1000.0000 [IU]/h | INTRAMUSCULAR | Status: DC
  Administered 2017-10-20: 700 [IU]/h via INTRAVENOUS
  Administered 2017-10-21 – 2017-10-22 (×2): 1000 [IU]/h via INTRAVENOUS
  Filled 2017-10-20 (×3): qty 250

## 2017-10-20 MED ORDER — TRAZODONE HCL 50 MG PO TABS: 25.0000 mg | ORAL_TABLET | Freq: Every day | ORAL | Status: DC

## 2017-10-20 MED ORDER — TRAZODONE HCL 50 MG PO TABS
25.0000 mg | ORAL_TABLET | Freq: Once | ORAL | Status: AC
  Administered 2017-10-20: 25 mg via ORAL
  Filled 2017-10-20: qty 1

## 2017-10-20 MED ORDER — ALBUTEROL SULFATE (2.5 MG/3ML) 0.083% IN NEBU: 3.0000 mL | INHALATION_SOLUTION | Freq: Four times a day (QID) | RESPIRATORY_TRACT | Status: DC | PRN

## 2017-10-20 MED ORDER — METOPROLOL SUCCINATE ER 50 MG PO TB24
50.0000 mg | ORAL_TABLET | Freq: Once | ORAL | Status: AC
  Administered 2017-10-20: 50 mg via ORAL
  Filled 2017-10-20: qty 1

## 2017-10-20 MED ORDER — ISOSORBIDE DINITRATE 30 MG PO TABS
30.0000 mg | ORAL_TABLET | Freq: Once | ORAL | Status: AC
  Administered 2017-10-20: 30 mg via ORAL
  Filled 2017-10-20: qty 1

## 2017-10-20 MED ORDER — LINAGLIPTIN 5 MG PO TABS
5.0000 mg | ORAL_TABLET | Freq: Every day | ORAL | Status: DC
  Administered 2017-10-20 – 2017-10-22 (×3): 5 mg via ORAL
  Filled 2017-10-20 (×3): qty 1

## 2017-10-20 MED ORDER — ATORVASTATIN CALCIUM 40 MG PO TABS
40.0000 mg | ORAL_TABLET | Freq: Every day | ORAL | Status: DC
  Administered 2017-10-20 – 2017-10-27 (×7): 40 mg via ORAL
  Filled 2017-10-20 (×7): qty 1

## 2017-10-20 MED ORDER — ASPIRIN EC 81 MG PO TBEC
81.0000 mg | DELAYED_RELEASE_TABLET | Freq: Once | ORAL | Status: AC
  Administered 2017-10-20: 81 mg via ORAL
  Filled 2017-10-20: qty 1

## 2017-10-20 MED ORDER — LOSARTAN POTASSIUM 25 MG PO TABS
25.0000 mg | ORAL_TABLET | Freq: Every day | ORAL | Status: DC
  Administered 2017-10-20 – 2017-10-22 (×3): 25 mg via ORAL
  Filled 2017-10-20 (×3): qty 1

## 2017-10-20 MED ORDER — ASPIRIN EC 81 MG PO TBEC
81.0000 mg | DELAYED_RELEASE_TABLET | Freq: Every day | ORAL | Status: DC
  Administered 2017-10-21 – 2017-10-22 (×2): 81 mg via ORAL
  Filled 2017-10-20 (×2): qty 1

## 2017-10-20 MED ORDER — NITROGLYCERIN IN D5W 200-5 MCG/ML-% IV SOLN
0.0000 ug/min | INTRAVENOUS | Status: DC
  Filled 2017-10-20: qty 250

## 2017-10-20 MED ORDER — METFORMIN HCL 500 MG PO TABS
500.0000 mg | ORAL_TABLET | Freq: Three times a day (TID) | ORAL | Status: DC
Start: 2017-10-20 — End: 2017-10-20
  Administered 2017-10-20: 500 mg via ORAL
  Filled 2017-10-20: qty 1

## 2017-10-20 MED ORDER — NITROGLYCERIN 0.4 MG SL SUBL
0.4000 mg | SUBLINGUAL_TABLET | SUBLINGUAL | Status: DC | PRN
Start: 2017-10-20 — End: 2017-10-23

## 2017-10-20 MED ORDER — POTASSIUM CHLORIDE CRYS ER 20 MEQ PO TBCR
40.0000 meq | EXTENDED_RELEASE_TABLET | Freq: Once | ORAL | Status: AC
Start: 2017-10-20 — End: 2017-10-20
  Administered 2017-10-20: 40 meq via ORAL
  Filled 2017-10-20: qty 2

## 2017-10-20 MED ORDER — HEPARIN BOLUS VIA INFUSION
4000.0000 [IU] | Freq: Once | INTRAVENOUS | Status: AC
  Administered 2017-10-20: 4000 [IU] via INTRAVENOUS
  Filled 2017-10-20: qty 4000

## 2017-10-20 MED ORDER — BUPROPION HCL ER (SMOKING DET) 150 MG PO TB12
150.0000 mg | ORAL_TABLET | Freq: Once | ORAL | Status: AC
  Administered 2017-10-20: 150 mg via ORAL
  Filled 2017-10-20: qty 1

## 2017-10-20 NOTE — Progress Notes (Signed)
ANTICOAGULATION CONSULT NOTE - Initial Consult  Pharmacy Consult for heparin Indication: chest pain/ACS  Allergies  Allergen Reactions  . Lisinopril   . Tramadol Other (See Comments)    sleepiness    Patient Measurements:   Heparin Dosing Weight: 60 Kg  Vital Signs: Temp: 98.2 F (36.8 C) (11/09 0921) Temp Source: Oral (11/09 0921) BP: 186/83 (11/09 0921) Pulse Rate: 67 (11/09 0921)  Labs: Recent Labs    10/20/17 0956  HGB 13.4  HCT 40.1  PLT 326    CrCl cannot be calculated (Patient's most recent lab result is older than the maximum 21 days allowed.).   Medical History: Past Medical History:  Diagnosis Date  . Asthma   . Coronary artery disease   . Depression   . Diabetes mellitus without complication ([place])   . GERD (gastroesophageal reflux disease)   . History of hiatal hernia   . Hypertension   . Pain    BACK  . Stroke ([place])    x3  . Thyroid cyst     Assessment: 61 yoF presenting with chest pain. No anticoagulation prior to admit; Initial troponin negative; CBC WNL; pharmacy to start heparin infusion  Goal of Therapy:  Heparin level 0.3-0.7 units/ml Monitor platelets by anticoagulation protocol: Yes   Plan:  Give 4000 units bolus x 1 Start heparin infusion at 700 units/hr Check anti-Xa level in 6 hours and daily while on heparin Continue to monitor H&H and platelets  Jennilyn Esteve L Kylina Vultaggio 10/20/2017,10:47 AM

## 2017-10-20 NOTE — Progress Notes (Signed)
[place] for heparin Indication: chest pain/ACS  Allergies  Allergen Reactions  . Lisinopril   . Tramadol Other (See Comments)    sleepiness    Patient Measurements: Height: 5\' 3"  (160 cm) Weight: 157 lb 12.8 oz (71.6 kg) IBW/kg (Calculated) : 52.4 Heparin Dosing Weight: 60 Kg  Vital Signs: Temp: 98.5 F (36.9 C) (11/09 1749) Temp Source: Oral (11/09 1749) BP: 107/76 (11/09 1749) Pulse Rate: 76 (11/09 1749)  Labs: Recent Labs    10/20/17 0956 10/20/17 1214 10/20/17 1709  HGB 13.4  --   --   HCT 40.1  --   --   PLT 326  --   --   HEPARINUNFRC  --   --  0.22*  CREATININE 0.88  --   --   TROPONINI  --  <0.03  --     Estimated Creatinine Clearance: 66.9 mL/min (by C-G formula based on SCr of 0.88 mg/dL).   Medical History: Past Medical History:  Diagnosis Date  . Asthma   . Coronary artery disease   . Depression   . Diabetes mellitus without complication ([place])   . GERD (gastroesophageal reflux disease)   . History of hiatal hernia   . Hypertension   . Pain    BACK  . Stroke ([place])    x3  . Thyroid cyst     Assessment: 89 yoF presenting with chest pain. No anticoagulation prior to admit; Initial troponin negative; CBC WNL; pharmacy to start heparin infusion  Initial heparin level 0.22.   Goal of Therapy:  Heparin level 0.3-0.7 units/ml Monitor platelets by anticoagulation protocol: Yes   Plan:  1. Increase heparin infusion to 850 units/hr  2. Recheck heparin level with am labs   Vincenza Hews, PharmD, BCPS 10/20/2017, 6:24 PM

## 2017-10-20 NOTE — ED Triage Notes (Signed)
Pt was in short stay getting pre-op for OHS and started having chest tightness to mid left chest, no radiation, no shortness of breath.  Placed on O2 by rapid response and brought to ED.  Plan for cards to admit until surgery.

## 2017-10-20 NOTE — ED Notes (Signed)
Cardiology at bedside.

## 2017-10-20 NOTE — Progress Notes (Signed)
Pre CABG Dopplers completed. Bilateral carotids are within normal limits,. Palmar arch evaluation-  Doppler waveforms right reverse with ulnar compression. Left diminishes greater than 50% with ulnar compression . Bilateral ABIs indicate normal arterial flow at rest.  Toma Copier, RVS 10/20/2017 6:49 PM

## 2017-10-20 NOTE — Progress Notes (Signed)
  Echocardiogram 2D Echocardiogram has been performed.  Kayla Malone 10/20/2017, 9:08 AM

## 2017-10-20 NOTE — Progress Notes (Signed)
[address]       [place] [address]             [phone]                    Lagena E Wetmore [facility] Medical Record #[id number] Date of Birth: January 21, 1961  Referring: No ref. provider found Primary Care: Edwina Barth  Chief Complaint:    Chief Complaint  Patient presents with  . Chest Pain    History of Present Illness:    Kayla Malone 56 y.o. female is seen in the office  today for evaluation of chest pain and review of recent cardiac catheterization to make recommendations concerning possible coronary artery bypass grafting.  Patient was seen in June 2018 after [place] emergency room for chest pain she was given steroids but no definitive follow-up with cardiology.  She was recently seen by Dr. Yancey Flemings, she was noted to have an abnormal EKG and subsequently underwent cardiac stress testing and echocardiogram with positive results and was referred for cardiac catheterization.  Patient has known long-standing diabetes.  Previous stroke in Oct 2016 with left-sided weakness and speech difficulty   Patient continues to have episodes of dizziness, and chest tightness.  Patient here today for prop studies and expressed her substernal chest discomfort , has been seend by cardiology and admitted for planned cabg Monday   Current Activity/ Functional Status:  Patient is independent with mobility/ambulation, transfers, ADL's, IADL's.   Zubrod Score:  At the time of surgery this patient's most appropriate activity status/level should be described as: []     0    Normal activity, no symptoms [x]     1    Restricted in physical strenuous activity but ambulatory, able to do out light work []     2    Ambulatory and capable of self care, unable to do work activities, up and about               >50 % of waking hours                              []     3    Only limited self care, in bed greater than 50% of waking hours []     4    Completely disabled,  no self care, confined to bed or chair []     5    Moribund Disability from stroke  Past Medical History:  Diagnosis Date  . Asthma   . Coronary artery disease   . Depression   . Diabetes mellitus without complication ([place])   . GERD (gastroesophageal reflux disease)   . History of hiatal hernia   . Hypertension   . Pain    BACK  . Stroke ([place])    x3  . Thyroid cyst     Past Surgical History:  Procedure Laterality Date  . ABDOMINAL HYSTERECTOMY    . EYE SURGERY      Family History  Problem Relation Age of Onset  . Heart failure Mother   . Pulmonary embolism Mother   . Cirrhosis Father   . Coronary artery disease Father   . Diabetes type II Father   . Hypertension Father   . Kidney disease Father   . Coronary artery disease Sister   . Stroke Sister   . COPD Brother   . Coronary artery disease Brother  COPD Brother    Coronary Artery Disease (Blocked arteries around heart) Brother    Cirrhosis Father    Coronary Artery Disease (Blocked arteries around heart) Father    Diabetes type II Father    High blood pressure (Hypertension) Father    Kidney disease Father    No Known Problems Maternal Aunt    No Known Problems Maternal Grandfather    No Known Problems Maternal Grandmother    No Known Problems Maternal Uncle    Heart failure Mother    Pulmonary embolism Mother    No Known Problems Paternal Aunt    No Known Problems Paternal Grandfather    No Known Problems Paternal Grandmother    No Known Problems Paternal Uncle    Coronary Artery Disease (Blocked arteries around heart) Sister    Stroke Sister    Allergies Neg Hx    Anesthesia problems Neg Hx    Asthma Neg Hx    Cancer Neg Hx    Clotting disorder Neg Hx    Diabetes Neg Hx    Hearing loss Neg Hx    Heart disease Neg Hx    Neurological disorder Neg Hx    Thyroid disease Neg Hx      Social History   Socioeconomic History  . Marital  status: Single    Spouse name: Not on file  . Number of children: Not on file  . Years of education: Not on file  . Highest education level: Not on file  Social Needs  . Financial resource strain: Not on file  . Food insecurity - worry: Not on file  . Food insecurity - inability: Not on file  . Transportation needs - medical: Not on file  . Transportation needs - non-medical: Not on file  Occupational History  . Not on file  Tobacco Use  . Smoking status: Former Smoker    Last attempt to quit: 09/13/1998    Years since quitting: 19.1  . Smokeless tobacco: Never Used  Substance and Sexual Activity  . Alcohol use: No  . Drug use: No  . Sexual activity: Not on file  Other Topics Concern  . Not on file  Social History Narrative  . Not on file    Social History   Tobacco Use  Smoking Status Former Smoker  . Last attempt to quit: 09/13/1998  . Years since quitting: 19.1  Smokeless Tobacco Never Used    Social History   Substance and Sexual Activity  Alcohol Use No     Allergies  Allergen Reactions  . Lisinopril   . Tramadol Other (See Comments)    sleepiness    Current Facility-Administered Medications  Medication Dose Route Frequency Provider Last Rate Last Dose  . acetaminophen (TYLENOL) tablet 650 mg  650 mg Oral Q4H PRN Lyda Jester M, PA-C      . albuterol (PROVENTIL) (2.5 MG/3ML) 0.083% nebulizer solution 3 mL  3 mL Inhalation Q6H PRN Lyda Jester M, PA-C      . [START ON 10/21/2017] aspirin EC tablet 81 mg  81 mg Oral Daily Simmons, Brittainy M, PA-C      . atorvastatin (LIPITOR) tablet 40 mg  40 mg Oral q1800 Lyda Jester M, PA-C      . heparin ADULT infusion 100 units/mL (25000 units/255mL sodium chloride 0.45%)  700 Units/hr Intravenous Continuous Rudisill, Jodean Lima, RPH 7 mL/hr at 10/20/17 1059 700 Units/hr at 10/20/17 1059  . linagliptin (TRADJENTA) tablet 5 mg  5 mg Oral Daily [place],  Lennette Bihari, MD   5 mg at 10/20/17 1130  . losartan (COZAAR)  tablet 25 mg  25 mg Oral Daily Jola Schmidt, MD   25 mg at 10/20/17 1130  . metFORMIN (GLUCOPHAGE) tablet 500 mg  500 mg Oral TID Jola Schmidt, MD   500 mg at 10/20/17 1026  . [START ON 10/21/2017] metoprolol succinate (TOPROL-XL) 24 hr tablet 50 mg  50 mg Oral Daily Simmons, Brittainy M, PA-C      . nitroGLYCERIN (NITROSTAT) SL tablet 0.4 mg  0.4 mg Sublingual Q5 Min x 3 PRN Lyda Jester M, PA-C      . nitroGLYCERIN 50 mg in dextrose 5 % 250 mL (0.2 mg/mL) infusion  0-30 mcg/min Intravenous Titrated Simmons, Brittainy M, PA-C      . ondansetron (ZOFRAN) injection 4 mg  4 mg Intravenous Q6H PRN Consuelo Pandy, PA-C       Current Outpatient Medications  Medication Sig Dispense Refill  . acetaminophen (TYLENOL) 500 MG tablet Take 1,000 mg by mouth every 6 (six) hours as needed for mild pain or headache.    . albuterol (PROVENTIL HFA;VENTOLIN HFA) 108 (90 Base) MCG/ACT inhaler Inhale 2 puffs into the lungs every 6 (six) hours as needed for wheezing or shortness of breath.     Marland Kitchen aspirin EC 81 MG tablet Take 81 mg by mouth daily.    Marland Kitchen atorvastatin (LIPITOR) 40 MG tablet Take 40 mg by mouth daily.    Marland Kitchen buPROPion (ZYBAN) 150 MG 12 hr tablet Take 150 mg by mouth 2 (two) times daily.  5  . fluticasone (FLONASE) 50 MCG/ACT nasal spray Place 2 sprays into both nostrils daily.    . Fluticasone-Salmeterol (ADVAIR DISKUS) 500-50 MCG/DOSE AEPB Inhale 1 puff into the lungs daily as needed (SOB).     Marland Kitchen ibuprofen (ADVIL,MOTRIN) 600 MG tablet Take 1 tablet (600 mg total) by mouth every 6 (six) hours as needed. 30 tablet 0  . isosorbide dinitrate (ISORDIL) 30 MG tablet Take 30 mg 2 (two) times daily by mouth.     . losartan (COZAAR) 25 MG tablet Take 25 mg by mouth daily.    . metFORMIN (GLUCOPHAGE) 500 MG tablet Take 500 mg by mouth 3 (three) times daily.   5  . metoprolol succinate (TOPROL-XL) 50 MG 24 hr tablet Take 50 mg 2 (two) times daily by mouth. Take with or immediately following a meal.       . nitroGLYCERIN (NITROSTAT) 0.4 MG SL tablet Place 0.4 mg under the tongue every 5 (five) minutes as needed for chest pain.    . pantoprazole (PROTONIX) 40 MG tablet Take 40 mg by mouth.     . sitaGLIPtin (JANUVIA) 50 MG tablet Take 50 mg by mouth daily.    . traZODone (DESYREL) 50 MG tablet Take 50 mg by mouth at bedtime.  5  . Vitamin D, Ergocalciferol, (DRISDOL) 50000 units CAPS capsule Take 50,000 Units every 7 (seven) days by mouth. tuesday    . nabumetone (RELAFEN) 750 MG tablet Take 750 mg by mouth 2 (two) times daily.      Pertinent items are noted in HPI.   Review of Systems:     Cardiac Review of Systems: Y or N  Chest Pain [ Y   ]  Resting SOB [ N  ] Exertional SOB  [ Y]  Orthopnea Jazmín.Cullens ]   Pedal Edema Aqua.Slicker   ]    Palpitations Aqua.Slicker  ] Syncope  [ N ]   Presyncope [  Y  ]  General Review of Systems: [Y] = yes [  ]=no Constitional: recent weight change Aqua.Slicker  ];  Wt loss over the last 3 months [   ] anorexia [  ]; fatigue [  ]; nausea [  ]; night sweats [  ]; fever [  ]; or chills [  ];          Dental: poor dentition[  ]; Last Dentist visit:   Eye : blurred vision [  ]; diplopia [   ]; vision changes [  ];  Amaurosis fugax[  ]; Resp: cough [  ];  wheezing[ N ];  hemoptysis[N  ]; shortness of breath[ Y ]; paroxysmal nocturnal dyspnea[  ]; dyspnea on exertion[  ]; or orthopnea[  ];  GI:  gallstones[  ], vomiting[  ];  dysphagia[  ]; melena[  ];  hematochezia [  ]; heartburn[  ];   Hx of  Colonoscopy[  ]; GU: kidney stones [  ]; hematuria[  ];   dysuria [  ];  nocturia[  ];  history of     obstruction [  ]; urinary frequency [  ]             Skin: rash, swelling[  ];, hair loss[  ];  peripheral edema[  ];  or itching[  ]; Musculosketetal: myalgias[  ];  joint swelling[  ];  joint erythema[  ];  joint pain[  ];  back pain[  ];  Heme/Lymph: bruising[  ];  bleeding[  ];  anemia[  ];  Neuro: TIA[ Y ];  headaches[  ];  stroke[Y  ];  vertigo[  ];  seizures[ N ];   paresthesias[  ];  difficulty  walking[Y  ];  Psych:depression[  ]; anxiety[  ];  Endocrine: diabetes[Y  ];  thyroid dysfunction[N ];  Immunizations: Flu up to date [  ]; Pneumococcal up to date [  ];  Other:  Physical Exam: BP 113/76   Pulse 77   Temp 98.2 F (36.8 C) (Oral)   Resp 19   SpO2 99%   PHYSICAL EXAMINATION: General appearance: alert, cooperative, appears older than stated age and no distress Head: Normocephalic, without obvious abnormality, atraumatic Neck: no adenopathy, no carotid bruit, no JVD, supple, symmetrical, trachea midline and thyroid not enlarged, symmetric, no tenderness/mass/nodules Lymph nodes: Cervical, supraclavicular, and axillary nodes normal. Resp: clear to auscultation bilaterally Back: symmetric, no curvature. ROM normal. No CVA tenderness. Cardio: regular rate and rhythm, S1, S2 normal, no murmur, click, rub or gallop GI: soft, non-tender; bowel sounds normal; no masses,  no organomegaly Extremities: extremities normal, atraumatic, no cyanosis or edema and Homans sign is negative, no sign of DVT Neurologic: Gait: Patient has left leg and arm weakness but is able to ambulate without a cane  Diagnostic Studies & Laboratory data:     Recent Radiology Findings:   Dg Chest Portable 1 View  Result Date: 10/20/2017 CLINICAL DATA:  Chest tightness several days. EXAM: PORTABLE CHEST 1 VIEW COMPARISON:  05/24/2017 FINDINGS: Lungs are adequately inflated without focal consolidation or effusion. Cardiomediastinal silhouette is within normal. There is minimal calcified plaque over the aortic arch. There mild degenerate changes of the spine and right shoulder. IMPRESSION: No active disease. Electronically Signed   By: Marin Olp M.D.   On: 10/20/2017 10:11     I have independently reviewed the above radiologic studies.  Recent Lab Findings: Lab Results  Component Value Date   WBC 6.0 10/20/2017  HGB 13.4 10/20/2017   HCT 40.1 10/20/2017   PLT 326 10/20/2017   GLUCOSE 161 (H)  10/20/2017   ALT 12 (L) 05/20/2016   AST 18 05/20/2016   NA 137 10/20/2017   K 3.4 (L) 10/20/2017   CL 102 10/20/2017   CREATININE 0.88 10/20/2017   BUN 7 10/20/2017   CO2 26 10/20/2017    CATH: Procedures   LEFT HEART CATH AND CORONARY ANGIOGRAPHY  Conclusion     Ramus lesion, 45 %stenosed.  Prox LAD to Mid LAD lesion, 90 %stenosed.  Mid RCA lesion, 80 %stenosed.  Dist RCA lesion, 60 %stenosed.   LEFT MAIN AND PROXIMAL LAD AND MID RCA HIGH GRADE LESION WITH NORMAL LVEF. ADVISE CABG ASAP   Procedural Details/Technique   Technical Details Cardiac Catheterization Procedure Note  Name: Kayla Malone MRN: [id number] DOB: 1961-09-29  Procedure: Left Heart Cath  Medications: Sedation: IV Versed, & IV Fentanyl Contrast: Omnipaque  Procedural details: The right groin was prepped, draped, and anesthetized with 1% lidocaine. Using modified Seldinger technique, a sheath was introduced into the right femoral artery. Standard Judkins catheters were used for coronary angiography and left ventriculography. Catheter exchanges were performed over a guidewire. There were no immediate procedural complications. The patient was transferred to the post catheterization recovery area for further monitoring.   KHAN,SHAUKAT A MD, [facility] 10/09/2017, 1:09 PM      Estimated blood loss <50 mL.  During this procedure the patient was administered the following to achieve and maintain moderate conscious sedation: Versed 1 mg, Fentanyl 50 mcg, while the patient's heart rate, blood pressure, and oxygen saturation were continuously monitored. The period of conscious sedation was 16 minutes, of which I was present face-to-face 100% of this time.    Coronary Findings   Dominance: Right  Left Anterior Descending  Prox LAD to Mid LAD lesion, 90% stenosed. The lesion is calcified.  Ramus Intermedius  Ramus lesion, 45% stenosed.  Right Coronary Artery  Mid RCA lesion, 80% stenosed.  Dist  RCA lesion, 60% stenosed.  Wall Motion   LVEF 60 PERCENT        Coronary Diagrams   Diagnostic Diagram         I have independently reviewed the above  cath films and reviewed the findings with the  patient .  Outpatient echo and Dr. Lisette Abu office report notes normal LV function trace to mild pulmonary regurgitation mild mitral regurgitation mild pulmonary hypertension mild left ventricular hypertrophy with grade 2 diastolic dysfunction   Assessment / Plan:   Patient is diabetic with positive symptoms of ischemia, she has complex proximal LAD disease and right coronary disease, with her diabetes and significant coronary artery disease multivessel she would best be served by proceeding with coronary artery bypass graft.  Risks and options were discussed with the patient and her daughter in detail.  She is willing to proceed, we plan for November 12.  The goals risks and alternatives of the planned surgical procedurehave been discussed with the patient in detail. The risks of the procedure including death, infection, stroke, myocardial infarction, bleeding, blood transfusion have all been discussed specifically.  I have quoted Kayla Malone a 4% of perioperative mortality and a complication rate as high as 40 %. The patient's questions have been answered.Kayla Malone is willing  to proceed with the planned procedure.   Lanelle Bal MD      [address] [place] [address] Office [phone]   Beeper [phone]  10/20/2017 2:58  PM     Patient ID: Kayla Malone, female   DOB: 10/31/1961, 55 y.o.   MRN: [id number]

## 2017-10-20 NOTE — Progress Notes (Signed)
Called to heart and vascular lab for outpatient with chest tightness.  Patient here for pre-op for CABG on Monday.  On arrival patient alert warm and dry - no acute distress.  States she developed chest tightness with activity of coming to hospital for testing and walking to dept.  She describes it as her normal tightness but was worse with this episode.  Reports no radiation of pain - no n&v - no diaphoresis - but does endorse moderate SOB -again increased from her normal.  She has completed her ECHO - strong regular pulse - lungs clear - stood to wheelchair and moved to next room for more testing - tolerated fair - increased SOB - placed on O2.  Prior to my arrival staff contacted Dr. Servando Snare - Cecilio Asper RN from office called back - requests to take patient to ED for admission by cardiology team.  Transported to ED without incident - registered and transported to triage - handoff to [place] - son at bedside.

## 2017-10-20 NOTE — ED Provider Notes (Addendum)
[facility] EMERGENCY DEPARTMENT Provider Note   CSN: [id number] Arrival date & time: 10/20/17  0913     History   Chief Complaint Chief Complaint  Patient presents with  . Chest Pain    HPI Kayla Malone is a 56 y.o. female.  HPI Patient is a 56 year old female who presents to the emergency department with complaints of chest pressure and shortness of breath.  She is scheduled for heart bypass surgery next Tuesday and was in the hospital obtaining preoperative labs and echocardiogram.  She states that walking down the halls gave her severe chest pressure and shortness of breath worse than baseline.  Her cardiothoracic surgeon was contacted to recommend that she come to the ER for evaluation.  Patient reports ongoing discomfort now.  She has been compliant with her meds except for this morning when she did not take her home medications.  She has not taken any of her blood pressure meds at this time.  She presents hypertensive.   Past Medical History:  Diagnosis Date  . Asthma   . Coronary artery disease   . Depression   . Diabetes mellitus without complication ([place])   . GERD (gastroesophageal reflux disease)   . History of hiatal hernia   . Hypertension   . Pain    BACK  . Stroke ([place])    x3  . Thyroid cyst     Patient Active Problem List   Diagnosis Date Noted  . Cerebral artery occlusion with cerebral infarction ([place]) 08/06/2017  . Chest pain, unspecified 08/06/2017  . Diabetes ([place]) 08/06/2017  . Hyperlipidemia, unspecified 08/06/2017  . HTN (hypertension) 08/06/2017  . Lumbar radiculopathy 02/15/2017  . Adenomatous colon polyp 02/20/2014  . Benign neoplasm of colon, unspecified 02/20/2014  . Asthma 09/13/2013  . GERD (gastroesophageal reflux disease) 09/13/2013  . CVA, old, hemiparesis ([place]) 09/13/2013    Past Surgical History:  Procedure Laterality Date  . ABDOMINAL HYSTERECTOMY    . EYE SURGERY      OB History    No data  available       Home Medications    Prior to Admission medications   Medication Sig Start Date End Date Taking? Authorizing Provider  acetaminophen (TYLENOL) 500 MG tablet Take 1,000 mg by mouth every 6 (six) hours as needed for mild pain or headache.   Yes [provider]  albuterol (PROVENTIL HFA;VENTOLIN HFA) 108 (90 Base) MCG/ACT inhaler Inhale 2 puffs into the lungs every 6 (six) hours as needed for wheezing or shortness of breath.    Yes [provider]  aspirin EC 81 MG tablet Take 81 mg by mouth daily.   Yes [provider]  atorvastatin (LIPITOR) 40 MG tablet Take 40 mg by mouth daily.   Yes [provider]  buPROPion (ZYBAN) 150 MG 12 hr tablet Take 150 mg by mouth 2 (two) times daily. 02/19/17  Yes [provider]  fluticasone (FLONASE) 50 MCG/ACT nasal spray Place 2 sprays into both nostrils daily.   Yes [provider]  Fluticasone-Salmeterol (ADVAIR DISKUS) 500-50 MCG/DOSE AEPB Inhale 1 puff into the lungs daily as needed (SOB).    Yes [provider]  ibuprofen (ADVIL,MOTRIN) 600 MG tablet Take 1 tablet (600 mg total) by mouth every 6 (six) hours as needed. 02/14/17  Yes Harvest Dark, MD  isosorbide dinitrate (ISORDIL) 30 MG tablet Take 30 mg 2 (two) times daily by mouth.    Yes [provider]  losartan (COZAAR) 25 MG  tablet Take 25 mg by mouth daily.   Yes [provider]  metFORMIN (GLUCOPHAGE) 500 MG tablet Take 500 mg by mouth 3 (three) times daily.  02/17/17  Yes [provider]  metoprolol succinate (TOPROL-XL) 50 MG 24 hr tablet Take 50 mg 2 (two) times daily by mouth. Take with or immediately following a meal.    Yes [provider]  nitroGLYCERIN (NITROSTAT) 0.4 MG SL tablet Place 0.4 mg under the tongue every 5 (five) minutes as needed for chest pain.   Yes [provider]  pantoprazole (PROTONIX) 40 MG tablet Take 40 mg by mouth.    Yes [provider]   sitaGLIPtin (JANUVIA) 50 MG tablet Take 50 mg by mouth daily.   Yes [provider]  traZODone (DESYREL) 50 MG tablet Take 50 mg by mouth at bedtime. 02/19/17  Yes [provider]  Vitamin D, Ergocalciferol, (DRISDOL) 50000 units CAPS capsule Take 50,000 Units every 7 (seven) days by mouth. tuesday   Yes [provider]  nabumetone (RELAFEN) 750 MG tablet Take 750 mg by mouth 2 (two) times daily.    [provider]    Family History Family History  Problem Relation Age of Onset  . Heart failure Mother   . Pulmonary embolism Mother   . Cirrhosis Father   . Coronary artery disease Father   . Diabetes type II Father   . Hypertension Father   . Kidney disease Father   . Coronary artery disease Sister   . Stroke Sister   . COPD Brother   . Coronary artery disease Brother     Social History Social History   Tobacco Use  . Smoking status: Former Smoker    Last attempt to quit: 09/13/1998    Years since quitting: 19.1  . Smokeless tobacco: Never Used  Substance Use Topics  . Alcohol use: No  . Drug use: No     Allergies   Lisinopril and Tramadol   Review of Systems Review of Systems  All other systems reviewed and are negative.    Physical Exam Updated Vital Signs BP (!) 176/86   Pulse 78   Temp 98.2 F (36.8 C) (Oral)   Resp 17   SpO2 100%   Physical Exam  Constitutional: She is oriented to person, place, and time. She appears well-developed and well-nourished. No distress.  HENT:  Head: Normocephalic and atraumatic.  Eyes: EOM are normal.  Neck: Normal range of motion.  Cardiovascular: Normal rate, regular rhythm and normal heart sounds.  Pulmonary/Chest: Effort normal and breath sounds normal.  Abdominal: Soft. She exhibits no distension. There is no tenderness.  Musculoskeletal: Normal range of motion.  Neurological: She is alert and oriented to person, place, and time.  Skin: Skin is warm and dry.  Psychiatric: She  has a normal mood and affect. Judgment normal.  Nursing note and vitals reviewed.    ED Treatments / Results  Labs (all labs ordered are listed, but only abnormal results are displayed) Labs Reviewed  CBC  BASIC METABOLIC PANEL  HEPARIN LEVEL (UNFRACTIONATED)  I-STAT TROPONIN, ED    EKG  EKG Interpretation  Date/Time:  Friday October 20 2017 09:17:41 EST Ventricular Rate:  70 PR Interval:  152 QRS Duration: 84 QT Interval:  384 QTC Calculation: 414 R Axis:   67 Text Interpretation:  Normal sinus rhythm Possible Anterior infarct , age undetermined Abnormal ECG No significant change was found Confirmed by Jola Schmidt [phone]) on 10/20/2017 9:50:45 AM  Radiology Dg Chest Portable 1 View  Result Date: 10/20/2017 CLINICAL DATA:  Chest tightness several days. EXAM: PORTABLE CHEST 1 VIEW COMPARISON:  05/24/2017 FINDINGS: Lungs are adequately inflated without focal consolidation or effusion. Cardiomediastinal silhouette is within normal. There is minimal calcified plaque over the aortic arch. There mild degenerate changes of the spine and right shoulder. IMPRESSION: No active disease. Electronically Signed   By: Marin Olp M.D.   On: 10/20/2017 10:11    Procedures .Critical Care Performed by: Jola Schmidt, MD Authorized by: Jola Schmidt, MD    Total critical care time: 33 minutes Critical care time was exclusive of separately billable procedures and treating other patients. Critical care was necessary to treat or prevent imminent or life-threatening deterioration. Critical care was time spent personally by me on the following activities: development of treatment plan with patient and/or surrogate as well as nursing, discussions with consultants, evaluation of patient's response to treatment, examination of patient, obtaining history from patient or surrogate, ordering and performing treatments and interventions, ordering and review of laboratory studies, ordering and  review of radiographic studies, pulse oximetry and re-evaluation of patient's condition.   Medications Ordered in ED Medications  buPROPion (ZYBAN) 12 hr tablet 150 mg (not administered)  isosorbide dinitrate (ISORDIL) tablet 30 mg (not administered)  losartan (COZAAR) tablet 25 mg (not administered)  metFORMIN (GLUCOPHAGE) tablet 500 mg (500 mg Oral Given 10/20/17 1026)  metoprolol succinate (TOPROL-XL) 24 hr tablet 50 mg (not administered)  linagliptin (TRADJENTA) tablet 5 mg (not administered)  heparin ADULT infusion 100 units/mL (25000 units/258mL sodium chloride 0.45%) (700 Units/hr Intravenous New Bag/Given 10/20/17 1059)  aspirin EC tablet 81 mg (81 mg Oral Given 10/20/17 1026)  heparin bolus via infusion 4,000 Units (4,000 Units Intravenous Bolus from Bag 10/20/17 1057)     Initial Impression / Assessment and Plan / ED Course  I have reviewed the triage vital signs and the nursing notes.  Pertinent labs & imaging results that were available during my care of the patient were reviewed by me and considered in my medical decision making (see chart for details).     Patient still having some ongoing discomfort at this time.  EKG is without ischemic changes.  Nitroglycerin now as well as home blood pressure meds that she has hypertensive.  This could represent hypertensive urgency/emergency with her ongoing chest pressure.  Given her known history of extensive coronary artery disease and plan for upcoming bypass surgery she will be admitted to the hospital for ongoing observation.  Heparin now.  IV nitroglycerin now.  Cardiology and CVTS aware  Final Clinical Impressions(s) / ED Diagnoses   Final diagnoses:  Unstable angina [facility])    ED Discharge Orders    None       Jola Schmidt, MD 10/20/17 1109    Jola Schmidt, MD 10/20/17 1110

## 2017-10-20 NOTE — Plan of Care (Signed)
Pt stable with heparin gtt and nitro gtt infusing. Pt denies any CP. Pt being worked-up for CABG surgery to be done 11/12.

## 2017-10-20 NOTE — H&P (Signed)
Cardiology Admission History and Physical:   Patient ID: Kayla Malone; MRN: [id number]; DOB: November 12, 1961   Admission date: 10/20/2017  Primary Care Provider: Yetta Barre, PA-C Primary Cardiologist: Dr. Humphrey Rolls   Chief Complaint:  Unstable Angina   Patient Profile:   Kayla Malone is a 56 y.o. AA female with DM, HTN and known severe multivessel CAD awaiting CABG 10/23/17, who is being admitted for observation until surgery given worsening unstable angina.  History of Present Illness:   Ms. Blew underwent [place] on 10/09/17 at [facility] and was found to have left main, proximal LAD and mid RCA high grade lesions with normal LVEF. Pt was referred to Dr. Servando Snare for consideration for CABG. Pt was seen by Dr. Servando Snare in outpatient clinic on 10/12/17. Decision was made to schedule for CABG on 10/23/17.   Pt presented for outpatient pre-operative testing today and developed left sided chest tightness at rest with dyspnea. Pt sent to ED and started on IV heparin. Plan is to admit to telemetry until CABG on Monday.   Pt notes pain has improved from 7/10, down to 4/10 with IV heparin. BP is elevated in the 016W systolic. Dyspnea has resolved. Troponin is negative.    Past Medical History:  Diagnosis Date  . Asthma   . Coronary artery disease   . Depression   . Diabetes mellitus without complication ([place])   . GERD (gastroesophageal reflux disease)   . History of hiatal hernia   . Hypertension   . Pain    BACK  . Stroke ([place])    x3  . Thyroid cyst     Past Surgical History:  Procedure Laterality Date  . ABDOMINAL HYSTERECTOMY    . EYE SURGERY       Medications Prior to Admission: Prior to Admission medications   Medication Sig Start Date End Date Taking? Authorizing Provider  acetaminophen (TYLENOL) 500 MG tablet Take 1,000 mg by mouth every 6 (six) hours as needed for mild pain or headache.   Yes [provider]  albuterol (PROVENTIL HFA;VENTOLIN HFA)  108 (90 Base) MCG/ACT inhaler Inhale 2 puffs into the lungs every 6 (six) hours as needed for wheezing or shortness of breath.    Yes [provider]  aspirin EC 81 MG tablet Take 81 mg by mouth daily.   Yes [provider]  atorvastatin (LIPITOR) 40 MG tablet Take 40 mg by mouth daily.   Yes [provider]  buPROPion (ZYBAN) 150 MG 12 hr tablet Take 150 mg by mouth 2 (two) times daily. 02/19/17  Yes [provider]  fluticasone (FLONASE) 50 MCG/ACT nasal spray Place 2 sprays into both nostrils daily.   Yes [provider]  Fluticasone-Salmeterol (ADVAIR DISKUS) 500-50 MCG/DOSE AEPB Inhale 1 puff into the lungs daily as needed (SOB).    Yes [provider]  ibuprofen (ADVIL,MOTRIN) 600 MG tablet Take 1 tablet (600 mg total) by mouth every 6 (six) hours as needed. 02/14/17  Yes Harvest Dark, MD  isosorbide dinitrate (ISORDIL) 30 MG tablet Take 30 mg 2 (two) times daily by mouth.    Yes [provider]  losartan (COZAAR) 25 MG tablet Take 25 mg by mouth daily.   Yes [provider]  metFORMIN (GLUCOPHAGE) 500 MG tablet Take 500 mg by mouth 3 (three) times daily.  02/17/17  Yes [provider]  metoprolol succinate (TOPROL-XL) 50 MG 24 hr tablet Take 50 mg 2 (two) times daily by mouth. Take with or immediately  following a meal.    Yes [provider]  nitroGLYCERIN (NITROSTAT) 0.4 MG SL tablet Place 0.4 mg under the tongue every 5 (five) minutes as needed for chest pain.   Yes [provider]  pantoprazole (PROTONIX) 40 MG tablet Take 40 mg by mouth.    Yes [provider]  sitaGLIPtin (JANUVIA) 50 MG tablet Take 50 mg by mouth daily.   Yes [provider]  traZODone (DESYREL) 50 MG tablet Take 50 mg by mouth at bedtime. 02/19/17  Yes [provider]  Vitamin D, Ergocalciferol, (DRISDOL) 50000 units CAPS capsule Take 50,000 Units every 7 (seven) days by mouth. tuesday   Yes  [provider]  nabumetone (RELAFEN) 750 MG tablet Take 750 mg by mouth 2 (two) times daily.    [provider]     Allergies:    Allergies  Allergen Reactions  . Lisinopril   . Tramadol Other (See Comments)    sleepiness    Social History:   Social History   Socioeconomic History  . Marital status: Single    Spouse name: Not on file  . Number of children: Not on file  . Years of education: Not on file  . Highest education level: Not on file  Social Needs  . Financial resource strain: Not on file  . Food insecurity - worry: Not on file  . Food insecurity - inability: Not on file  . Transportation needs - medical: Not on file  . Transportation needs - non-medical: Not on file  Occupational History  . Not on file  Tobacco Use  . Smoking status: Former Smoker    Last attempt to quit: 09/13/1998    Years since quitting: 19.1  . Smokeless tobacco: Never Used  Substance and Sexual Activity  . Alcohol use: No  . Drug use: No  . Sexual activity: Not on file  Other Topics Concern  . Not on file  Social History Narrative  . Not on file    Family History:   The patient's family history includes COPD in her brother; Cirrhosis in her father; Coronary artery disease in her brother, father, and sister; Diabetes type II in her father; Heart failure in her mother; Hypertension in her father; Kidney disease in her father; Pulmonary embolism in her mother; Stroke in her sister.    ROS:  Please see the history of present illness.  All other ROS reviewed and negative.     Physical Exam/Data:   Vitals:   10/20/17 1000 10/20/17 1015 10/20/17 1045 10/20/17 1100  BP: (!) 166/86 (!) 168/82 (!) 178/89 (!) 176/86  Pulse: 65 69 82 78  Resp: 19 19 15 17   Temp:      TempSrc:      SpO2: 100% 100% 100% 100%   No intake or output data in the 24 hours ending 10/20/17 1110 There were no vitals filed for this visit. There is no height or weight on file to calculate BMI.    General:  Well nourished, well developed, in no acute distress HEENT: normal Lymph: no adenopathy Neck: no JVD Endocrine:  No thryomegaly Vascular: No carotid bruits; FA pulses 2+ bilaterally without bruits  Cardiac:  normal S1, S2; RRR; no murmur  Lungs:  clear to auscultation bilaterally, no wheezing, rhonchi or rales  Abd: soft, nontender, no hepatomegaly  Ext: no edema Musculoskeletal:  No deformities, BUE and BLE strength normal and equal Skin: warm and dry  Neuro:  CNs 2-12 intact, no focal abnormalities  noted Psych:  Normal affect    EKG:  The ECG that was done NSR was personally reviewed and demonstrates  Relevant CV Studies: LHC 10/09/17 Procedures   LEFT HEART CATH AND CORONARY ANGIOGRAPHY  Conclusion     [place] lesion, 45 %stenosed.  Prox LAD to Mid LAD lesion, 90 %stenosed.  Mid RCA lesion, 80 %stenosed.  Dist RCA lesion, 60 %stenosed.   LEFT MAIN AND PROXIMAL LAD AND MID RCA HIGH GRADE LESION WITH NORMAL LVEF. ADVISE CABG ASAP    Laboratory Data:  ChemistryNo results for input(s): NA, K, CL, CO2, GLUCOSE, BUN, CREATININE, CALCIUM, GFRNONAA, GFRAA, ANIONGAP in the last 168 hours.  No results for input(s): PROT, ALBUMIN, AST, ALT, ALKPHOS, BILITOT in the last 168 hours. Hematology Recent Labs  Lab 10/20/17 0956  WBC 6.0  RBC 4.87  HGB 13.4  HCT 40.1  MCV 82.3  MCH 27.5  MCHC 33.4  RDW 12.9  PLT 326   Cardiac EnzymesNo results for input(s): TROPONINI in the last 168 hours.  Recent Labs  Lab 10/20/17 1007  TROPIPOC 0.00    BNPNo results for input(s): BNP, PROBNP in the last 168 hours.  DDimer No results for input(s): DDIMER in the last 168 hours.  Radiology/Studies:  Dg Chest Portable 1 View  Result Date: 10/20/2017 CLINICAL DATA:  Chest tightness several days. EXAM: PORTABLE CHEST 1 VIEW COMPARISON:  05/24/2017 FINDINGS: Lungs are adequately inflated without focal consolidation or effusion. Cardiomediastinal silhouette is within normal.  There is minimal calcified plaque over the aortic arch. There mild degenerate changes of the spine and right shoulder. IMPRESSION: No active disease. Electronically Signed   By: Marin Olp M.D.   On: 10/20/2017 10:11    Assessment and Plan:   1. Severe Multivessel CAD: plan is for CABG on Monday. We will admit to stepdown until surgery can be performed given development of more unstable symptoms. Keep on IV heparin. Given residual resting CP and HTN, will add IV nitro. Pt will also need to complete preoperative w/u prior to surgery. 2D echo has been completed. She still needs PFTs and vascular doppler studies. Continue ASA and statin.   2. HTN: moderately elevated. Continue metoprolol and losartan and nitrates. Monitor closely.   3. DM: continue home meds.   Severity of Illness: The appropriate patient status for this patient is INPATIENT. Inpatient status is judged to be reasonable and necessary in order to provide the required intensity of service to ensure the patient's safety. The patient's presenting symptoms, physical exam findings, and initial radiographic and laboratory data in the context of their chronic comorbidities is felt to place them at high risk for further clinical deterioration. Furthermore, it is not anticipated that the patient will be medically stable for discharge from the hospital within 2 midnights of admission. The following factors support the patient status of inpatient.   " The patient's presenting symptoms include unstable angina, high grade coronary disease. " The worrisome physical exam findings include hypertension. " The initial radiographic and laboratory data are worrisome because of high grade coronary artery disease noted on recent diagnostic LHC " The chronic co-morbidities include CAD, DM and HTN.   * I certify that at the point of admission it is my clinical judgment that the patient will require inpatient hospital care spanning beyond 2 midnights from  the point of admission due to high intensity of service, high risk for further deterioration and high frequency of surveillance required.*    For questions or updates, please contact  [facility] Please consult [email or web address] for contact info under Cardiology/STEMI.    Signed, Lyda Jester, PA-C  10/20/2017 11:10 AM

## 2017-10-21 LAB — BASIC METABOLIC PANEL
Anion gap: 8 (ref 5–15)
BUN: 8 mg/dL (ref 6–20)
CHLORIDE: 105 mmol/L (ref 101–111)
CO2: 26 mmol/L (ref 22–32)
Calcium: 8.9 mg/dL (ref 8.9–10.3)
Creatinine, Ser: 0.82 mg/dL (ref 0.44–1.00)
GFR calc Af Amer: >60 mL/min (ref >60)
GFR calc non Af Amer: >60 mL/min (ref >60)
GLUCOSE: 108 mg/dL — AB (ref 65–99)
POTASSIUM: 3.5 mmol/L (ref 3.5–5.1)
Sodium: 139 mmol/L (ref 135–145)

## 2017-10-21 LAB — HEPARIN LEVEL (UNFRACTIONATED)
Heparin Unfractionated: 0.21 IU/mL — ABNORMAL LOW (ref 0.30–0.70)
Heparin Unfractionated: 0.38 IU/mL (ref 0.30–0.70)

## 2017-10-21 LAB — TROPONIN I

## 2017-10-21 LAB — HIV ANTIBODY (ROUTINE TESTING W REFLEX): HIV SCREEN 4TH GENERATION: NONREACTIVE

## 2017-10-21 NOTE — Progress Notes (Signed)
ANTICOAGULATION CONSULT NOTE - Follow Up Consult  Pharmacy Consult for Heparin  Indication: Multi-vessel disease awaiting CABG 11/12  Allergies  Allergen Reactions  . Lisinopril   . Tramadol Other (See Comments)    sleepiness    Patient Measurements: Height: 5\' 3"  (160 cm) Weight: 157 lb 8 oz (71.4 kg) IBW/kg (Calculated) : 52.4  Vital Signs: Temp: 98.2 F (36.8 C) (11/10 0354) Temp Source: Oral (11/10 0354) BP: 138/78 (11/10 0454) Pulse Rate: 70 (11/10 0454)  Labs: Recent Labs    10/20/17 0956 10/20/17 1214 10/20/17 1709 10/20/17 2327 10/21/17 0427  HGB 13.4  --   --   --   --   HCT 40.1  --   --   --   --   PLT 326  --   --   --   --   HEPARINUNFRC  --   --  0.22*  --  0.21*  CREATININE 0.88  --   --   --  0.82  TROPONINI  --  <0.03 <0.03 <0.03  --     Estimated Creatinine Clearance: 71.7 mL/min (by C-G formula based on SCr of 0.82 mg/dL).  Assessment: On heparin, multi-vessel disease awaiting CABG on 11/12, heparin level low, no issues per RN.  Goal of Therapy:  Heparin level 0.3-0.7 units/ml Monitor platelets by anticoagulation protocol: Yes   Plan:  Inc heparin to 1000 units/hr 1500 HL  Narda Bonds 10/21/2017,6:24 AM

## 2017-10-21 NOTE — Progress Notes (Signed)
ANTICOAGULATION CONSULT NOTE - Follow Up Consult  Pharmacy Consult for Heparin  Indication: Multi-vessel disease awaiting CABG 11/12  Allergies  Allergen Reactions  . Lisinopril   . Tramadol Other (See Comments)    sleepiness    Patient Measurements: Height: 5\' 3"  (160 cm) Weight: 157 lb 8 oz (71.4 kg) IBW/kg (Calculated) : 52.4  Vital Signs: Temp: 98.2 F (36.8 C) (11/10 1500) Temp Source: Axillary (11/10 1500) BP: 148/92 (11/10 1500) Pulse Rate: 71 (11/10 1500)  Labs: Recent Labs    10/20/17 0956 10/20/17 1214 10/20/17 1709 10/20/17 2327 10/21/17 0427 10/21/17 1542  HGB 13.4  --   --   --   --   --   HCT 40.1  --   --   --   --   --   PLT 326  --   --   --   --   --   HEPARINUNFRC  --   --  0.22*  --  0.21* 0.38  CREATININE 0.88  --   --   --  0.82  --   TROPONINI  --  <0.03 <0.03 <0.03  --   --     Estimated Creatinine Clearance: 71.7 mL/min (by C-G formula based on SCr of 0.82 mg/dL).  Assessment: 56 yo female with  multi-vessel disease on heparin awaiting CABG on 11/12. -heparin level at goal after increase to 1000 units/hr  Goal of Therapy:  Heparin level 0.3-0.7 units/ml Monitor platelets by anticoagulation protocol: Yes   Plan: -No heparin changes needed -Daily heparin level and CBC  Hildred Laser, Pharm D 10/21/2017 4:50 PM

## 2017-10-21 NOTE — Progress Notes (Signed)
Progress Note  Patient Name: Kayla Malone Date of Encounter: 10/21/2017  Primary Cardiologist: Dr. [name]  Subjective   No recurrent angina on present medications.  Up in room.  No breathlessness at rest or palpitations.  Inpatient Medications    Scheduled Meds: . aspirin EC  81 mg Oral Daily  . atorvastatin  40 mg Oral q1800  . linagliptin  5 mg Oral Daily  . losartan  25 mg Oral Daily  . metFORMIN  500 mg Oral TID WC  . metoprolol succinate  50 mg Oral Daily   Continuous Infusions: . heparin 1,000 Units/hr (10/21/17 0624)  . nitroGLYCERIN 5 mcg/min (10/20/17 1800)   PRN Meds: acetaminophen, albuterol, nitroGLYCERIN, ondansetron (ZOFRAN) IV   Vital Signs    Vitals:   10/21/17 0254 10/21/17 0354 10/21/17 0454 10/21/17 0800  BP: 107/67 123/75 138/78 112/78  Pulse: 75 79 70 72  Resp:  18  18  Temp:  98.2 F (36.8 C)    TempSrc:  Oral    SpO2: 94% 99% 99% 99%  Weight:  157 lb 8 oz (71.4 kg)    Height:        Intake/Output Summary (Last 24 hours) at 10/21/2017 1015 Last data filed at 10/21/2017 0518 Gross per 24 hour  Intake 401.32 ml  Output -  Net 401.32 ml   Filed Weights   10/20/17 1749 10/21/17 0354  Weight: 157 lb 12.8 oz (71.6 kg) 157 lb 8 oz (71.4 kg)    Telemetry    Sinus rhythm.  Personally reviewed.  ECG    Tracing from 10/20/2017 shows a sinus rhythm with poor R wave progression.  Personally reviewed.  Physical Exam   GEN: No acute distress.   Neck: No JVD. Cardiac: RRR, soft murmur, no gallop. Respiratory: Nonlabored. Clear to auscultation bilaterally. GI: Soft, nontender, bowel sounds present. MS: No edema; No deformity. Neuro:  Nonfocal. Psych: Alert and oriented x 3. Normal affect.  Labs    Chemistry Recent Labs  Lab 10/20/17 0956 10/21/17 0427  NA 137 139  K 3.4* 3.5  CL 102 105  CO2 26 26  GLUCOSE 161* 108*  BUN 7 8  CREATININE 0.88 0.82  CALCIUM 9.4 8.9  GFRNONAA >60 >60  GFRAA >60 >60  ANIONGAP  9 8     Hematology Recent Labs  Lab 10/20/17 0956  WBC 6.0  RBC 4.87  HGB 13.4  HCT 40.1  MCV 82.3  MCH 27.5  MCHC 33.4  RDW 12.9  PLT 326    Cardiac Enzymes Recent Labs  Lab 10/20/17 1214 10/20/17 1709 10/20/17 2327  TROPONINI <0.03 <0.03 <0.03    Recent Labs  Lab 10/20/17 1007  TROPIPOC 0.00     Radiology    Dg Chest Portable 1 View  Result Date: 10/20/2017 CLINICAL DATA:  Chest tightness several days. EXAM: PORTABLE CHEST 1 VIEW COMPARISON:  05/24/2017 FINDINGS: Lungs are adequately inflated without focal consolidation or effusion. Cardiomediastinal silhouette is within normal. There is minimal calcified plaque over the aortic arch. There mild degenerate changes of the spine and right shoulder. IMPRESSION: No active disease. Electronically Signed   By: Marin Olp M.D.   On: 10/20/2017 10:11    Cardiac Studies   Cardiac catheterization 10/09/2017:  Ramus lesion, 45 %stenosed.  Prox LAD to Mid LAD lesion, 90 %stenosed.  Mid RCA lesion, 80 %stenosed.  Dist RCA lesion, 60 %stenosed.   LEFT MAIN AND PROXIMAL LAD AND MID RCA HIGH GRADE LESION WITH NORMAL LVEF.  ADVISE CABG ASAP  Echocardiogram 10/20/2017: Study Conclusions  - Left ventricle: The cavity size was normal. There was mild   concentric hypertrophy. Systolic function was normal. The   estimated ejection fraction was in the range of 60% to 65%. Wall   motion was normal; there were no regional wall motion   abnormalities. Doppler parameters are consistent with   pseudonormal left ventricular relaxation (grade 2 diastolic   dysfunction). The E/e&' ratio is >15, suggesting elevated LV   filling pressure. - Mitral valve: Mildly thickened leaflets . There was trivial   regurgitation. - Left atrium: The atrium was normal in size. - Inferior vena cava: The vessel was normal in size. The   respirophasic diameter changes were in the normal range (>= 50%),   consistent with normal central venous  pressure.  Impressions:  - LVEF 60-65%, mild LVH, normal wall motion, grade 2 DD with   elevated LV filling pressure, trivial MR, normal IVC.  Patient Profile     56 y.o. female with a history of hypertension, type 2 diabetes mellitus, and multivessel CAD currently admitted with unstable angina symptoms and plan for CABG on Monday.  Assessment & Plan    1.  Unstable angina.  Troponin I levels are negative.  Symptoms have settled down significantly on IV heparin and nitroglycerin.  2.  Multivessel CAD awaiting CABG with Dr. Servando Snare on Monday.  3.  Type 2 diabetes mellitus.  4.  Hyperlipidemia, on Lipitor.  Continue current regimen including aspirin, Lipitor, Cozaar, Toprol-XL, IV heparin, and IV nitroglycerin.  Follow-up CBC and BMET in a.m.  Signed, Rozann Lesches, MD  10/21/2017, 10:15 AM

## 2017-10-22 LAB — CBC
HCT: 36.9 % (ref 36.0–46.0)
Hemoglobin: 12.2 g/dL (ref 12.0–15.0)
MCH: 27.1 pg (ref 26.0–34.0)
MCHC: 33.1 g/dL (ref 30.0–36.0)
MCV: 81.8 fL (ref 78.0–100.0)
PLATELETS: 350 10*3/uL (ref 150–400)
RBC: 4.51 MIL/uL (ref 3.87–5.11)
RDW: 13.1 % (ref 11.5–15.5)
WBC: 7 10*3/uL (ref 4.0–10.5)

## 2017-10-22 LAB — SURGICAL PCR SCREEN
MRSA, PCR: NEGATIVE
Staphylococcus aureus: NEGATIVE

## 2017-10-22 LAB — HEPARIN LEVEL (UNFRACTIONATED): Heparin Unfractionated: 0.37 IU/mL (ref 0.30–0.70)

## 2017-10-22 LAB — COMPREHENSIVE METABOLIC PANEL
ALT: 12 U/L — ABNORMAL LOW (ref 14–54)
AST: 17 U/L (ref 15–41)
Albumin: 3.5 g/dL (ref 3.5–5.0)
Alkaline Phosphatase: 80 U/L (ref 38–126)
Anion gap: 9 (ref 5–15)
BUN: 10 mg/dL (ref 6–20)
CO2: 24 mmol/L (ref 22–32)
Calcium: 9.4 mg/dL (ref 8.9–10.3)
Chloride: 103 mmol/L (ref 101–111)
Creatinine, Ser: 0.77 mg/dL (ref 0.44–1.00)
GFR calc Af Amer: >60 mL/min (ref >60)
GFR calc non Af Amer: >60 mL/min (ref >60)
Glucose, Bld: 138 mg/dL — ABNORMAL HIGH (ref 65–99)
Potassium: 3.7 mmol/L (ref 3.5–5.1)
Sodium: 136 mmol/L (ref 135–145)
Total Bilirubin: 0.8 mg/dL (ref 0.3–1.2)
Total Protein: 7 g/dL (ref 6.5–8.1)

## 2017-10-22 LAB — BLOOD GAS, ARTERIAL
Acid-Base Excess: 1.2 mmol/L (ref 0.0–2.0)
Bicarbonate: 25.5 mmol/L (ref 20.0–28.0)
Drawn by: 520751
FIO2: 0.21
O2 Saturation: 96.4 %
Patient temperature: 98.6
pCO2 arterial: 42 mmHg (ref 32.0–48.0)
pH, Arterial: 7.401 (ref 7.350–7.450)
pO2, Arterial: 92 mmHg (ref 83.0–108.0)

## 2017-10-22 LAB — URINALYSIS, ROUTINE W REFLEX MICROSCOPIC
Bilirubin Urine: NEGATIVE
Glucose, UA: NEGATIVE mg/dL
Ketones, ur: NEGATIVE mg/dL
Nitrite: NEGATIVE
Protein, ur: NEGATIVE mg/dL
Specific Gravity, Urine: 1.015 (ref 1.005–1.030)
pH: 5 (ref 5.0–8.0)

## 2017-10-22 LAB — APTT: aPTT: 85 seconds — ABNORMAL HIGH (ref 24–36)

## 2017-10-22 LAB — BASIC METABOLIC PANEL
ANION GAP: 8 (ref 5–15)
BUN: 8 mg/dL (ref 6–20)
CALCIUM: 9 mg/dL (ref 8.9–10.3)
CO2: 27 mmol/L (ref 22–32)
Chloride: 103 mmol/L (ref 101–111)
Creatinine, Ser: 0.82 mg/dL (ref 0.44–1.00)
GFR calc Af Amer: >60 mL/min (ref >60)
GLUCOSE: 106 mg/dL — AB (ref 65–99)
Potassium: 3.7 mmol/L (ref 3.5–5.1)
Sodium: 138 mmol/L (ref 135–145)

## 2017-10-22 LAB — TYPE AND SCREEN
ABO/RH(D): B POS
Antibody Screen: NEGATIVE

## 2017-10-22 LAB — PROTIME-INR
INR: 0.99
Prothrombin Time: 13 seconds (ref 11.4–15.2)

## 2017-10-22 MED ORDER — POTASSIUM CHLORIDE 2 MEQ/ML IV SOLN
80.0000 meq | INTRAVENOUS | Status: DC
  Filled 2017-10-22: qty 40

## 2017-10-22 MED ORDER — PLASMA-LYTE 148 IV SOLN
INTRAVENOUS | Status: AC
  Administered 2017-10-23: 500 mL
  Filled 2017-10-22: qty 2.5

## 2017-10-22 MED ORDER — CHLORHEXIDINE GLUCONATE CLOTH 2 % EX PADS
6.0000 | MEDICATED_PAD | Freq: Once | CUTANEOUS | Status: AC
  Administered 2017-10-23: 6 via TOPICAL

## 2017-10-22 MED ORDER — VANCOMYCIN HCL 10 G IV SOLR
1250.0000 mg | INTRAVENOUS | Status: AC
  Administered 2017-10-23: 1250 mg via INTRAVENOUS
  Filled 2017-10-22: qty 1250

## 2017-10-22 MED ORDER — EPINEPHRINE PF 1 MG/ML IJ SOLN
0.0000 ug/min | INTRAVENOUS | Status: DC
  Filled 2017-10-22: qty 4

## 2017-10-22 MED ORDER — SODIUM CHLORIDE 0.9 % IV SOLN
30.0000 ug/min | INTRAVENOUS | Status: AC
  Administered 2017-10-23: 10 ug/min via INTRAVENOUS
  Filled 2017-10-22: qty 2

## 2017-10-22 MED ORDER — CHLORHEXIDINE GLUCONATE CLOTH 2 % EX PADS
6.0000 | MEDICATED_PAD | Freq: Once | CUTANEOUS | Status: AC
  Administered 2017-10-22: 6 via TOPICAL

## 2017-10-22 MED ORDER — NITROGLYCERIN IN D5W 200-5 MCG/ML-% IV SOLN
2.0000 ug/min | INTRAVENOUS | Status: DC
  Filled 2017-10-22: qty 250

## 2017-10-22 MED ORDER — TEMAZEPAM 15 MG PO CAPS: 15.0000 mg | ORAL_CAPSULE | Freq: Once | ORAL | Status: DC | PRN

## 2017-10-22 MED ORDER — CHLORHEXIDINE GLUCONATE 0.12 % MT SOLN
15.0000 mL | Freq: Once | OROMUCOSAL | Status: AC
  Administered 2017-10-22: 15 mL via OROMUCOSAL
  Filled 2017-10-22: qty 15

## 2017-10-22 MED ORDER — BISACODYL 5 MG PO TBEC: 5.0000 mg | DELAYED_RELEASE_TABLET | Freq: Once | ORAL | Status: DC

## 2017-10-22 MED ORDER — DEXTROSE 5 % IV SOLN
1.5000 g | INTRAVENOUS | Status: AC
  Administered 2017-10-23: 1.5 g via INTRAVENOUS
  Administered 2017-10-23: .75 g via INTRAVENOUS
  Filled 2017-10-22: qty 1.5

## 2017-10-22 MED ORDER — CHLORHEXIDINE GLUCONATE 0.12 % MT SOLN
OROMUCOSAL | Status: AC
  Filled 2017-10-22: qty 15

## 2017-10-22 MED ORDER — DOPAMINE-DEXTROSE 3.2-5 MG/ML-% IV SOLN
0.0000 ug/kg/min | INTRAVENOUS | Status: DC
  Filled 2017-10-22: qty 250

## 2017-10-22 MED ORDER — SODIUM CHLORIDE 0.9 % IV SOLN
INTRAVENOUS | Status: DC
  Filled 2017-10-22: qty 30

## 2017-10-22 MED ORDER — TRANEXAMIC ACID (OHS) PUMP PRIME SOLUTION
2.0000 mg/kg | INTRAVENOUS | Status: DC
  Filled 2017-10-22: qty 1.42

## 2017-10-22 MED ORDER — TRANEXAMIC ACID 1000 MG/10ML IV SOLN
1.5000 mg/kg/h | INTRAVENOUS | Status: AC
  Administered 2017-10-23: 1.5 mg/kg/h via INTRAVENOUS
  Filled 2017-10-22: qty 25

## 2017-10-22 MED ORDER — DEXMEDETOMIDINE HCL IN NACL 400 MCG/100ML IV SOLN
0.1000 ug/kg/h | INTRAVENOUS | Status: AC
  Administered 2017-10-23: .3 ug/kg/h via INTRAVENOUS
  Filled 2017-10-22: qty 100

## 2017-10-22 MED ORDER — TRANEXAMIC ACID (OHS) BOLUS VIA INFUSION
15.0000 mg/kg | INTRAVENOUS | Status: AC
  Administered 2017-10-23: 1063.5 mg via INTRAVENOUS
  Filled 2017-10-22 (×2): qty 1064

## 2017-10-22 MED ORDER — METOPROLOL TARTRATE 12.5 MG HALF TABLET
12.5000 mg | ORAL_TABLET | Freq: Once | ORAL | Status: AC
  Administered 2017-10-22: 12.5 mg via ORAL
  Filled 2017-10-22: qty 1

## 2017-10-22 MED ORDER — MAGNESIUM SULFATE 50 % IJ SOLN
40.0000 meq | INTRAMUSCULAR | Status: DC
  Filled 2017-10-22: qty 9.85

## 2017-10-22 MED ORDER — SODIUM CHLORIDE 0.9 % IV SOLN
INTRAVENOUS | Status: AC
  Administered 2017-10-23: 1 [IU]/h via INTRAVENOUS
  Filled 2017-10-22: qty 1

## 2017-10-22 MED ORDER — CEFUROXIME SODIUM 750 MG IJ SOLR
750.0000 mg | INTRAMUSCULAR | Status: DC
  Filled 2017-10-22: qty 750

## 2017-10-22 NOTE — Progress Notes (Signed)
ANTICOAGULATION CONSULT NOTE - Follow Up Consult  Pharmacy Consult for Heparin  Indication: Multi-vessel disease awaiting CABG 11/12  Patient Measurements: Height: 5\' 3"  (160 cm) Weight: 156 lb 4.8 oz (70.9 kg) IBW/kg (Calculated) : 52.4  Labs: Recent Labs    10/20/17 0956 10/20/17 1214  10/20/17 1709 10/20/17 2327 10/21/17 0427 10/21/17 1542 10/22/17 0020  HGB 13.4  --   --   --   --   --   --  12.2  HCT 40.1  --   --   --   --   --   --  36.9  PLT 326  --   --   --   --   --   --  350  HEPARINUNFRC  --   --    < > 0.22*  --  0.21* 0.38 0.37  CREATININE 0.88  --   --   --   --  0.82  --  0.82  TROPONINI  --  <0.03  --  <0.03 <0.03  --   --   --    < > = values in this interval not displayed.   Assessment:  56 yo female with  multi-vessel disease on heparin awaiting CABG on 11/12. CBC stable, heparin level therapeutic on past two levels.   Goal of Therapy:  Heparin level 0.3-0.7 units/ml Monitor platelets by anticoagulation protocol: Yes   Plan: Heparin 1000 units/hr Daily HL, [place] PharmD PGY1 Pharmacy Practice Resident 10/22/2017 7:43 AM Pager: [phone] Phone [phone]

## 2017-10-22 NOTE — Progress Notes (Signed)
Progress Note  Patient Name: Kayla Malone Date of Encounter: 10/22/2017  Primary Cardiologist: Dr. [name]  Subjective   No chest pain or breathlessness at rest.  No palpitations.  Inpatient Medications    Scheduled Meds: . aspirin EC  81 mg Oral Daily  . atorvastatin  40 mg Oral q1800  . bisacodyl  5 mg Oral Once  . [START ON 10/23/2017] chlorhexidine  15 mL Mouth/Throat Once  . Chlorhexidine Gluconate Cloth  6 each Topical Once   And  . Chlorhexidine Gluconate Cloth  6 each Topical Once  . [START ON 10/23/2017] heparin-papaverine-plasmalyte irrigation   Irrigation To OR  . linagliptin  5 mg Oral Daily  . losartan  25 mg Oral Daily  . [START ON 10/23/2017] magnesium sulfate  40 mEq Other To OR  . metFORMIN  500 mg Oral TID WC  . metoprolol succinate  50 mg Oral Daily  . [START ON 10/23/2017] metoprolol tartrate  12.5 mg Oral Once  . [START ON 10/23/2017] potassium chloride  80 mEq Other To OR  . [START ON 10/23/2017] tranexamic acid  15 mg/kg Intravenous To OR  . [START ON 10/23/2017] tranexamic acid  2 mg/kg Intracatheter To OR   Continuous Infusions: . [START ON 10/23/2017] cefUROXime (ZINACEF)  IV    . [START ON 10/23/2017] cefUROXime (ZINACEF)  IV    . [START ON 10/23/2017] dexmedetomidine    . [START ON 10/23/2017] DOPamine    . [START ON 10/23/2017] epinephrine    . [START ON 10/23/2017] heparin 30,000 units/NS 1000 mL solution for CELLSAVER    . heparin 1,000 Units/hr (10/22/17 0506)  . [START ON 10/23/2017] insulin (NOVOLIN-R) infusion    . nitroGLYCERIN 1.5 mcg/min (10/22/17 0506)  . [START ON 10/23/2017] nitroGLYCERIN    . [START ON 10/23/2017] phenylephrine 20mg /2109mL NS (0.08mg /ml) infusion    . [START ON 10/23/2017] tranexamic acid (CYKLOKAPRON) infusion ([facility])    . [START ON 10/23/2017] vancomycin     PRN Meds: acetaminophen, albuterol, nitroGLYCERIN, ondansetron (ZOFRAN) IV, temazepam   Vital Signs    Vitals:   10/22/17 0041  10/22/17 0100 10/22/17 0200 10/22/17 0355  BP: 119/68   108/70  Pulse: 62 62 62 65  Resp:    17  Temp:    97.6 F (36.4 C)  TempSrc:    Oral  SpO2: 98% 99% 97% 98%  Weight:    156 lb 4.8 oz (70.9 kg)  Height:        Intake/Output Summary (Last 24 hours) at 10/22/2017 0928 Last data filed at 10/22/2017 0506 Gross per 24 hour  Intake 961.95 ml  Output 800 ml  Net 161.95 ml   Filed Weights   10/20/17 1749 10/21/17 0354 10/22/17 0355  Weight: 157 lb 12.8 oz (71.6 kg) 157 lb 8 oz (71.4 kg) 156 lb 4.8 oz (70.9 kg)    Telemetry    Sinus rhythm.  Personally reviewed.  Physical Exam   GEN: No acute distress.   Neck: No JVD. Cardiac: RRR, soft systolic murmur, rub, or gallop.  Respiratory: Nonlabored. Clear to auscultation bilaterally. GI: Soft, nontender, bowel sounds present. MS: No edema; No deformity. Neuro:  Nonfocal. Psych: Alert and oriented x 3. Normal affect.  Labs    Chemistry Recent Labs  Lab 10/20/17 0956 10/21/17 0427 10/22/17 0020  NA 137 139 138  K 3.4* 3.5 3.7  CL 102 105 103  CO2 26 26 27   GLUCOSE 161* 108* 106*  BUN 7 8 8   CREATININE 0.88  0.82 0.82  CALCIUM 9.4 8.9 9.0  GFRNONAA >60 >60 >60  GFRAA >60 >60 >60  ANIONGAP 9 8 8      Hematology Recent Labs  Lab 10/20/17 0956 10/22/17 0020  WBC 6.0 7.0  RBC 4.87 4.51  HGB 13.4 12.2  HCT 40.1 36.9  MCV 82.3 81.8  MCH 27.5 27.1  MCHC 33.4 33.1  RDW 12.9 13.1  PLT 326 350    Cardiac Enzymes Recent Labs  Lab 10/20/17 1214 10/20/17 1709 10/20/17 2327  TROPONINI <0.03 <0.03 <0.03    Recent Labs  Lab 10/20/17 1007  TROPIPOC 0.00     Radiology    Dg Chest Portable 1 View  Result Date: 10/20/2017 CLINICAL DATA:  Chest tightness several days. EXAM: PORTABLE CHEST 1 VIEW COMPARISON:  05/24/2017 FINDINGS: Lungs are adequately inflated without focal consolidation or effusion. Cardiomediastinal silhouette is within normal. There is minimal calcified plaque over the aortic arch. There  mild degenerate changes of the spine and right shoulder. IMPRESSION: No active disease. Electronically Signed   By: Marin Olp M.D.   On: 10/20/2017 10:11    Cardiac Studies   Cardiac catheterization 10/09/2017:  Ramus lesion, 45 %stenosed.  Prox LAD to Mid LAD lesion, 90 %stenosed.  Mid RCA lesion, 80 %stenosed.  Dist RCA lesion, 60 %stenosed.  LEFT MAIN AND PROXIMAL LAD AND MID RCA HIGH GRADE LESION WITH NORMAL LVEF. ADVISE CABG ASAP  Echocardiogram 10/20/2017: Study Conclusions  - Left ventricle: The cavity size was normal. There was mild concentric hypertrophy. Systolic function was normal. The estimated ejection fraction was in the range of 60% to 65%. Wall motion was normal; there were no regional wall motion abnormalities. Doppler parameters are consistent with pseudonormal left ventricular relaxation (grade 2 diastolic dysfunction). The E/e&' ratio is >15, suggesting elevated LV filling pressure. - Mitral valve: Mildly thickened leaflets . There was trivial regurgitation. - Left atrium: The atrium was normal in size. - Inferior vena cava: The vessel was normal in size. The respirophasic diameter changes were in the normal range (>= 50%), consistent with normal central venous pressure.  Impressions:  - LVEF 60-65%, mild LVH, normal wall motion, grade 2 DD with elevated LV filling pressure, trivial MR, normal IVC.  Patient Profile     56 y.o. female with a history of hypertension, type 2 diabetes mellitus, and multivessel CAD currently admitted with unstable angina symptoms and plan for CABG on Monday.  Assessment & Plan    1.  Unstable angina, symptoms well controlled on IV heparin and nitroglycerin.  Troponin I levels negative.  2.  Multivessel CAD, scheduled for CABG tomorrow morning with Dr. Servando Snare.  3.  Type 2 diabetes mellitus.  4.  Mixed hyperlipidemia, on Lipitor.  No change in current regimen including aspirin,  Cozaar, Toprol-XL, Lipitor, IV heparin and nitroglycerin.  For CABG tomorrow.  Signed, Rozann Lesches, MD  10/22/2017, 9:28 AM

## 2017-10-23 ENCOUNTER — Inpatient Hospital Stay (HOSPITAL_COMMUNITY)
Admission: EM | Disposition: A | Discharge status: Home / Self Care | Payer: Self-pay | Source: Home / Self Care | Attending: Cardiothoracic Surgery

## 2017-10-23 ENCOUNTER — Inpatient Hospital Stay (HOSPITAL_COMMUNITY): Payer: Medicare Other | Admitting: Emergency Medicine

## 2017-10-23 ENCOUNTER — Inpatient Hospital Stay (HOSPITAL_COMMUNITY): Payer: Medicare Other

## 2017-10-23 ENCOUNTER — Encounter (HOSPITAL_COMMUNITY): Payer: Self-pay | Admitting: Certified Registered Nurse Anesthetist

## 2017-10-23 ENCOUNTER — Inpatient Hospital Stay (HOSPITAL_COMMUNITY): Admission: RE | Admit: 2017-10-23 | Payer: Medicare Other | Source: Ambulatory Visit | Admitting: Cardiothoracic Surgery

## 2017-10-23 DIAGNOSIS — I25118 Atherosclerotic heart disease of native coronary artery with other forms of angina pectoris: Secondary | ICD-10-CM | POA: Insufficient documentation

## 2017-10-23 DIAGNOSIS — I2510 Atherosclerotic heart disease of native coronary artery without angina pectoris: Secondary | ICD-10-CM

## 2017-10-23 HISTORY — PX: TEE WITHOUT CARDIOVERSION: SHX5443

## 2017-10-23 HISTORY — PX: CORONARY ARTERY BYPASS GRAFT: SHX141

## 2017-10-23 LAB — POCT I-STAT, CHEM 8
BUN: 6 mg/dL (ref 6–20)
BUN: 4 mg/dL — AB (ref 6–20)
BUN: 5 mg/dL — AB (ref 6–20)
BUN: 5 mg/dL — ABNORMAL LOW (ref 6–20)
BUN: 4 mg/dL — ABNORMAL LOW (ref 6–20)
BUN: 5 mg/dL — AB (ref 6–20)
CALCIUM ION: 0.97 mmol/L — AB (ref 1.15–1.40)
CALCIUM ION: 1.12 mmol/L — AB (ref 1.15–1.40)
CHLORIDE: 101 mmol/L (ref 101–111)
CHLORIDE: 103 mmol/L (ref 101–111)
CHLORIDE: 98 mmol/L — AB (ref 101–111)
CHLORIDE: 105 mmol/L (ref 101–111)
CREATININE: 0.5 mg/dL (ref 0.44–1.00)
CREATININE: 0.6 mg/dL (ref 0.44–1.00)
CREATININE: 0.5 mg/dL (ref 0.44–1.00)
Calcium, Ion: 1.26 mmol/L (ref 1.15–1.40)
Calcium, Ion: 1.2 mmol/L (ref 1.15–1.40)
Calcium, Ion: 1.11 mmol/L — ABNORMAL LOW (ref 1.15–1.40)
Calcium, Ion: 1.18 mmol/L (ref 1.15–1.40)
Chloride: 101 mmol/L (ref 101–111)
Chloride: 101 mmol/L (ref 101–111)
Creatinine, Ser: 0.6 mg/dL (ref 0.44–1.00)
Creatinine, Ser: 0.5 mg/dL (ref 0.44–1.00)
Creatinine, Ser: 0.7 mg/dL (ref 0.44–1.00)
GLUCOSE: 109 mg/dL — AB (ref 65–99)
GLUCOSE: 153 mg/dL — AB (ref 65–99)
Glucose, Bld: 130 mg/dL — ABNORMAL HIGH (ref 65–99)
Glucose, Bld: 123 mg/dL — ABNORMAL HIGH (ref 65–99)
Glucose, Bld: 135 mg/dL — ABNORMAL HIGH (ref 65–99)
Glucose, Bld: 126 mg/dL — ABNORMAL HIGH (ref 65–99)
HCT: 29 % — ABNORMAL LOW (ref 36.0–46.0)
HCT: 31 % — ABNORMAL LOW (ref 36.0–46.0)
HCT: 30 % — ABNORMAL LOW (ref 36.0–46.0)
HEMATOCRIT: 33 % — AB (ref 36.0–46.0)
HEMATOCRIT: 27 % — AB (ref 36.0–46.0)
HEMATOCRIT: 30 % — AB (ref 36.0–46.0)
HEMOGLOBIN: 11.2 g/dL — AB (ref 12.0–15.0)
HEMOGLOBIN: 10.2 g/dL — AB (ref 12.0–15.0)
HEMOGLOBIN: 9.2 g/dL — AB (ref 12.0–15.0)
Hemoglobin: 10.5 g/dL — ABNORMAL LOW (ref 12.0–15.0)
Hemoglobin: 9.9 g/dL — ABNORMAL LOW (ref 12.0–15.0)
Hemoglobin: 10.2 g/dL — ABNORMAL LOW (ref 12.0–15.0)
POTASSIUM: 3.5 mmol/L (ref 3.5–5.1)
POTASSIUM: 3.5 mmol/L (ref 3.5–5.1)
POTASSIUM: 4.4 mmol/L (ref 3.5–5.1)
Potassium: 3.2 mmol/L — ABNORMAL LOW (ref 3.5–5.1)
Potassium: 3.6 mmol/L (ref 3.5–5.1)
Potassium: 4.1 mmol/L (ref 3.5–5.1)
SODIUM: 140 mmol/L (ref 135–145)
SODIUM: 143 mmol/L (ref 135–145)
Sodium: 140 mmol/L (ref 135–145)
Sodium: 142 mmol/L (ref 135–145)
Sodium: 139 mmol/L (ref 135–145)
Sodium: 140 mmol/L (ref 135–145)
TCO2: 29 mmol/L (ref 22–32)
TCO2: 28 mmol/L (ref 22–32)
TCO2: 28 mmol/L (ref 22–32)
TCO2: 28 mmol/L (ref 22–32)
TCO2: 28 mmol/L (ref 22–32)
TCO2: 25 mmol/L (ref 22–32)

## 2017-10-23 LAB — CBC
HCT: 31.3 % — ABNORMAL LOW (ref 36.0–46.0)
HCT: 36.1 % (ref 36.0–46.0)
HCT: 37.3 % (ref 36.0–46.0)
Hemoglobin: 10.6 g/dL — ABNORMAL LOW (ref 12.0–15.0)
Hemoglobin: 12.1 g/dL (ref 12.0–15.0)
Hemoglobin: 12.4 g/dL (ref 12.0–15.0)
MCH: 27.2 pg (ref 26.0–34.0)
MCH: 27.2 pg (ref 26.0–34.0)
MCH: 27.4 pg (ref 26.0–34.0)
MCHC: 33.2 g/dL (ref 30.0–36.0)
MCHC: 33.5 g/dL (ref 30.0–36.0)
MCHC: 33.9 g/dL (ref 30.0–36.0)
MCV: 80.9 fL (ref 78.0–100.0)
MCV: 81.1 fL (ref 78.0–100.0)
MCV: 81.8 fL (ref 78.0–100.0)
PLATELETS: 215 10*3/uL (ref 150–400)
Platelets: 178 10*3/uL (ref 150–400)
Platelets: 353 10*3/uL (ref 150–400)
RBC: 3.87 MIL/uL (ref 3.87–5.11)
RBC: 4.45 MIL/uL (ref 3.87–5.11)
RBC: 4.56 MIL/uL (ref 3.87–5.11)
RDW: 12.7 % (ref 11.5–15.5)
RDW: 12.8 % (ref 11.5–15.5)
RDW: 13 % (ref 11.5–15.5)
WBC: 13.1 10*3/uL — ABNORMAL HIGH (ref 4.0–10.5)
WBC: 15.1 10*3/uL — ABNORMAL HIGH (ref 4.0–10.5)
WBC: 6.3 10*3/uL (ref 4.0–10.5)

## 2017-10-23 LAB — BASIC METABOLIC PANEL
Anion gap: 9 (ref 5–15)
BUN: 7 mg/dL (ref 6–20)
CO2: 25 mmol/L (ref 22–32)
Calcium: 9.3 mg/dL (ref 8.9–10.3)
Chloride: 104 mmol/L (ref 101–111)
Creatinine, Ser: 0.81 mg/dL (ref 0.44–1.00)
GFR calc Af Amer: >60 mL/min (ref >60)
GFR calc non Af Amer: >60 mL/min (ref >60)
Glucose, Bld: 144 mg/dL — ABNORMAL HIGH (ref 65–99)
Potassium: 3.5 mmol/L (ref 3.5–5.1)
Sodium: 138 mmol/L (ref 135–145)

## 2017-10-23 LAB — POCT I-STAT 3, ART BLOOD GAS (G3+)
ACID-BASE EXCESS: 4 mmol/L — AB (ref 0.0–2.0)
ACID-BASE EXCESS: 8 mmol/L — AB (ref 0.0–2.0)
ACID-BASE EXCESS: 2 mmol/L (ref 0.0–2.0)
Acid-Base Excess: 8 mmol/L — ABNORMAL HIGH (ref 0.0–2.0)
Acid-Base Excess: 1 mmol/L (ref 0.0–2.0)
Acid-base deficit: 1 mmol/L (ref 0.0–2.0)
BICARBONATE: 29.1 mmol/L — AB (ref 20.0–28.0)
BICARBONATE: 31.3 mmol/L — AB (ref 20.0–28.0)
BICARBONATE: 26.9 mmol/L (ref 20.0–28.0)
BICARBONATE: 26 mmol/L (ref 20.0–28.0)
Bicarbonate: 30.6 mmol/L — ABNORMAL HIGH (ref 20.0–28.0)
Bicarbonate: 25.1 mmol/L (ref 20.0–28.0)
O2 SAT: 100 %
O2 SAT: 100 %
O2 SAT: 100 %
O2 Saturation: 100 %
O2 Saturation: 99 %
O2 Saturation: 99 %
PCO2 ART: 35.3 mmHg (ref 32.0–48.0)
PCO2 ART: 38.4 mmHg (ref 32.0–48.0)
PCO2 ART: 44 mmHg (ref 32.0–48.0)
PCO2 ART: 41.4 mmHg (ref 32.0–48.0)
PH ART: 7.545 — AB (ref 7.350–7.450)
PH ART: 7.437 (ref 7.350–7.450)
PH ART: 7.359 (ref 7.350–7.450)
PH ART: 7.402 (ref 7.350–7.450)
PO2 ART: 362 mmHg — AB (ref 83.0–108.0)
PO2 ART: 182 mmHg — AB (ref 83.0–108.0)
PO2 ART: 146 mmHg — AB (ref 83.0–108.0)
Patient temperature: 36
Patient temperature: 36.2
TCO2: 32 mmol/L (ref 22–32)
TCO2: 30 mmol/L (ref 22–32)
TCO2: 32 mmol/L (ref 22–32)
TCO2: 28 mmol/L (ref 22–32)
TCO2: 26 mmol/L (ref 22–32)
TCO2: 27 mmol/L (ref 22–32)
pCO2 arterial: 43.2 mmHg (ref 32.0–48.0)
pCO2 arterial: 38.1 mmHg (ref 32.0–48.0)
pH, Arterial: 7.522 — ABNORMAL HIGH (ref 7.350–7.450)
pH, Arterial: 7.45 (ref 7.350–7.450)
pO2, Arterial: 290 mmHg — ABNORMAL HIGH (ref 83.0–108.0)
pO2, Arterial: 393 mmHg — ABNORMAL HIGH (ref 83.0–108.0)
pO2, Arterial: 131 mmHg — ABNORMAL HIGH (ref 83.0–108.0)

## 2017-10-23 LAB — APTT: aPTT: 30 seconds (ref 24–36)

## 2017-10-23 LAB — GLUCOSE, CAPILLARY
GLUCOSE-CAPILLARY: 144 mg/dL — AB (ref 65–99)
GLUCOSE-CAPILLARY: 106 mg/dL — AB (ref 65–99)
GLUCOSE-CAPILLARY: 123 mg/dL — AB (ref 65–99)
GLUCOSE-CAPILLARY: 82 mg/dL (ref 65–99)
GLUCOSE-CAPILLARY: 109 mg/dL — AB (ref 65–99)
GLUCOSE-CAPILLARY: 113 mg/dL — AB (ref 65–99)
GLUCOSE-CAPILLARY: 75 mg/dL (ref 65–99)
GLUCOSE-CAPILLARY: 109 mg/dL — AB (ref 65–99)
GLUCOSE-CAPILLARY: 127 mg/dL — AB (ref 65–99)
GLUCOSE-CAPILLARY: 175 mg/dL — AB (ref 65–99)
Glucose-Capillary: 118 mg/dL — ABNORMAL HIGH (ref 65–99)
Glucose-Capillary: 130 mg/dL — ABNORMAL HIGH (ref 65–99)
Glucose-Capillary: 126 mg/dL — ABNORMAL HIGH (ref 65–99)
Glucose-Capillary: 144 mg/dL — ABNORMAL HIGH (ref 65–99)
Glucose-Capillary: 104 mg/dL — ABNORMAL HIGH (ref 65–99)
Glucose-Capillary: 120 mg/dL — ABNORMAL HIGH (ref 65–99)

## 2017-10-23 LAB — COMPREHENSIVE METABOLIC PANEL
ALT: 12 U/L — ABNORMAL LOW (ref 14–54)
AST: 27 U/L (ref 15–41)
Albumin: 3 g/dL — ABNORMAL LOW (ref 3.5–5.0)
Alkaline Phosphatase: 49 U/L (ref 38–126)
Anion gap: 5 (ref 5–15)
BUN: <5 mg/dL — ABNORMAL LOW (ref 6–20)
CO2: 25 mmol/L (ref 22–32)
Calcium: 7.9 mg/dL — ABNORMAL LOW (ref 8.9–10.3)
Chloride: 111 mmol/L (ref 101–111)
Creatinine, Ser: 0.98 mg/dL (ref 0.44–1.00)
GFR calc Af Amer: >60 mL/min (ref >60)
GFR calc non Af Amer: >60 mL/min (ref >60)
Glucose, Bld: 115 mg/dL — ABNORMAL HIGH (ref 65–99)
Potassium: 3.3 mmol/L — ABNORMAL LOW (ref 3.5–5.1)
Sodium: 141 mmol/L (ref 135–145)
Total Bilirubin: 0.9 mg/dL (ref 0.3–1.2)
Total Protein: 5.2 g/dL — ABNORMAL LOW (ref 6.5–8.1)

## 2017-10-23 LAB — PLATELET COUNT: Platelets: 237 10*3/uL (ref 150–400)

## 2017-10-23 LAB — HEMOGLOBIN AND HEMATOCRIT, BLOOD
HCT: 26.2 % — ABNORMAL LOW (ref 36.0–46.0)
Hemoglobin: 8.9 g/dL — ABNORMAL LOW (ref 12.0–15.0)

## 2017-10-23 LAB — HEMOGLOBIN A1C
Hgb A1c MFr Bld: 7.3 % — ABNORMAL HIGH (ref 4.8–5.6)
Hgb A1c MFr Bld: 7.3 % — ABNORMAL HIGH (ref 4.8–5.6)
Mean Plasma Glucose: 163 mg/dL
Mean Plasma Glucose: 162.81 mg/dL

## 2017-10-23 LAB — POCT I-STAT 4, (NA,K, GLUC, HGB,HCT)
Glucose, Bld: 125 mg/dL — ABNORMAL HIGH (ref 65–99)
HEMATOCRIT: 34 % — AB (ref 36.0–46.0)
Hemoglobin: 11.6 g/dL — ABNORMAL LOW (ref 12.0–15.0)
Potassium: 3.4 mmol/L — ABNORMAL LOW (ref 3.5–5.1)
SODIUM: 145 mmol/L (ref 135–145)

## 2017-10-23 LAB — PROTIME-INR
INR: 1.35
PROTHROMBIN TIME: 16.5 s — AB (ref 11.4–15.2)

## 2017-10-23 LAB — MAGNESIUM: Magnesium: 2.8 mg/dL — ABNORMAL HIGH (ref 1.7–2.4)

## 2017-10-23 LAB — CREATININE, SERUM
Creatinine, Ser: 0.78 mg/dL (ref 0.44–1.00)
GFR calc Af Amer: >60 mL/min (ref >60)
GFR calc non Af Amer: >60 mL/min (ref >60)

## 2017-10-23 LAB — ABO/RH: ABO/RH(D): B POS

## 2017-10-23 LAB — HEPARIN LEVEL (UNFRACTIONATED): Heparin Unfractionated: 0.37 IU/mL (ref 0.30–0.70)

## 2017-10-23 SURGERY — CORONARY ARTERY BYPASS GRAFTING (CABG)
Anesthesia: General | Site: Chest

## 2017-10-23 MED ORDER — LACTATED RINGERS IV SOLN
INTRAVENOUS | Status: DC | PRN
  Administered 2017-10-23: 07:00:00 via INTRAVENOUS

## 2017-10-23 MED ORDER — 0.9 % SODIUM CHLORIDE (POUR BTL) OPTIME
TOPICAL | Status: DC | PRN
  Administered 2017-10-23: 6000 mL

## 2017-10-23 MED ORDER — SODIUM CHLORIDE 0.9% FLUSH: 10.0000 mL | INTRAVENOUS | Status: DC | PRN

## 2017-10-23 MED ORDER — DOCUSATE SODIUM 100 MG PO CAPS
200.0000 mg | ORAL_CAPSULE | Freq: Every day | ORAL | Status: DC
  Administered 2017-10-24: 200 mg via ORAL
  Filled 2017-10-23: qty 2

## 2017-10-23 MED ORDER — PROPOFOL 10 MG/ML IV BOLUS
INTRAVENOUS | Status: DC | PRN
  Administered 2017-10-23: 40 mg via INTRAVENOUS

## 2017-10-23 MED ORDER — CHLORHEXIDINE GLUCONATE 4 % EX LIQD: 30.0000 mL | CUTANEOUS | Status: DC

## 2017-10-23 MED ORDER — BISACODYL 10 MG RE SUPP: 10.0000 mg | Freq: Every day | RECTAL | Status: DC

## 2017-10-23 MED ORDER — MOMETASONE FURO-FORMOTEROL FUM 200-5 MCG/ACT IN AERO
2.0000 | INHALATION_SPRAY | Freq: Two times a day (BID) | RESPIRATORY_TRACT | Status: DC
  Administered 2017-10-23 – 2017-10-28 (×9): 2 via RESPIRATORY_TRACT
  Filled 2017-10-23: qty 8.8

## 2017-10-23 MED ORDER — LACTATED RINGERS IV SOLN: INTRAVENOUS | Status: DC

## 2017-10-23 MED ORDER — DEXMEDETOMIDINE HCL IN NACL 400 MCG/100ML IV SOLN: 0.0000 ug/kg/h | INTRAVENOUS | Status: DC

## 2017-10-23 MED ORDER — SODIUM CHLORIDE 0.9 % IV SOLN: INTRAVENOUS | Status: DC

## 2017-10-23 MED ORDER — ONDANSETRON HCL 4 MG/2ML IJ SOLN: 4.0000 mg | Freq: Once | INTRAMUSCULAR | Status: DC | PRN

## 2017-10-23 MED ORDER — ACETAMINOPHEN 500 MG PO TABS
1000.0000 mg | ORAL_TABLET | Freq: Four times a day (QID) | ORAL | Status: DC
Start: 2017-10-24 — End: 2017-10-25
  Administered 2017-10-23 – 2017-10-25 (×6): 1000 mg via ORAL
  Filled 2017-10-23 (×6): qty 2

## 2017-10-23 MED ORDER — SODIUM CHLORIDE 0.9 % IV SOLN
0.0000 ug/min | INTRAVENOUS | Status: DC
  Filled 2017-10-23: qty 2

## 2017-10-23 MED ORDER — METOPROLOL TARTRATE 5 MG/5ML IV SOLN: 2.5000 mg | INTRAVENOUS | Status: DC | PRN

## 2017-10-23 MED ORDER — MAGNESIUM SULFATE 4 GM/100ML IV SOLN
4.0000 g | Freq: Once | INTRAVENOUS | Status: AC
  Administered 2017-10-23: 4 g via INTRAVENOUS
  Filled 2017-10-23: qty 100

## 2017-10-23 MED ORDER — ONDANSETRON HCL 4 MG/2ML IJ SOLN
4.0000 mg | Freq: Four times a day (QID) | INTRAMUSCULAR | Status: DC | PRN
  Administered 2017-10-23 – 2017-10-24 (×2): 4 mg via INTRAVENOUS
  Filled 2017-10-23 (×2): qty 2

## 2017-10-23 MED ORDER — MIDAZOLAM HCL 5 MG/5ML IJ SOLN
INTRAMUSCULAR | Status: DC | PRN
  Administered 2017-10-23 (×3): 2 mg via INTRAVENOUS
  Administered 2017-10-23 (×2): 1 mg via INTRAVENOUS
  Administered 2017-10-23: 2 mg via INTRAVENOUS

## 2017-10-23 MED ORDER — TRAZODONE HCL 50 MG PO TABS
50.0000 mg | ORAL_TABLET | Freq: Every day | ORAL | Status: DC
  Administered 2017-10-24 – 2017-10-27 (×4): 50 mg via ORAL
  Filled 2017-10-23 (×4): qty 1

## 2017-10-23 MED ORDER — BISACODYL 5 MG PO TBEC
10.0000 mg | DELAYED_RELEASE_TABLET | Freq: Every day | ORAL | Status: DC
  Administered 2017-10-24: 10 mg via ORAL
  Filled 2017-10-23: qty 2

## 2017-10-23 MED ORDER — HEMOSTATIC AGENTS (NO CHARGE) OPTIME
TOPICAL | Status: DC | PRN
  Administered 2017-10-23 (×2): 1 via TOPICAL

## 2017-10-23 MED ORDER — METOPROLOL TARTRATE 25 MG/10 ML ORAL SUSPENSION: 12.5000 mg | Freq: Two times a day (BID) | ORAL | Status: DC

## 2017-10-23 MED ORDER — AMIODARONE HCL IN DEXTROSE 360-4.14 MG/200ML-% IV SOLN
60.0000 mg/h | INTRAVENOUS | Status: DC
  Administered 2017-10-23: 60 mg/h via INTRAVENOUS
  Filled 2017-10-23: qty 200

## 2017-10-23 MED ORDER — ALBUMIN HUMAN 5 % IV SOLN
250.0000 mL | INTRAVENOUS | Status: AC | PRN
Start: 2017-10-23 — End: 2017-10-24
  Administered 2017-10-23 (×3): 250 mL via INTRAVENOUS
  Filled 2017-10-23: qty 250

## 2017-10-23 MED ORDER — ARTIFICIAL TEARS OPHTHALMIC OINT
TOPICAL_OINTMENT | OPHTHALMIC | Status: DC | PRN
  Administered 2017-10-23: 1 via OPHTHALMIC

## 2017-10-23 MED ORDER — MORPHINE SULFATE (PF) 4 MG/ML IV SOLN
1.0000 mg | INTRAVENOUS | Status: AC | PRN
  Administered 2017-10-23 (×2): 2 mg via INTRAVENOUS
  Filled 2017-10-23: qty 1

## 2017-10-23 MED ORDER — ORAL CARE MOUTH RINSE
15.0000 mL | Freq: Two times a day (BID) | OROMUCOSAL | Status: DC
  Administered 2017-10-23 – 2017-10-24 (×2): 15 mL via OROMUCOSAL

## 2017-10-23 MED ORDER — POTASSIUM CHLORIDE 10 MEQ/50ML IV SOLN
10.0000 meq | INTRAVENOUS | Status: AC
  Administered 2017-10-23 (×3): 10 meq via INTRAVENOUS

## 2017-10-23 MED ORDER — CHLORHEXIDINE GLUCONATE 0.12 % MT SOLN: 15.0000 mL | Freq: Once | OROMUCOSAL | Status: AC

## 2017-10-23 MED ORDER — AMIODARONE IV BOLUS ONLY 150 MG/100ML
INTRAVENOUS | Status: DC | PRN
  Administered 2017-10-23: 150 mg via INTRAVENOUS

## 2017-10-23 MED ORDER — LACTATED RINGERS IV SOLN
INTRAVENOUS | Status: DC | PRN
  Administered 2017-10-23 (×2): via INTRAVENOUS

## 2017-10-23 MED ORDER — MEPERIDINE HCL 25 MG/ML IJ SOLN: 6.2500 mg | INTRAMUSCULAR | Status: DC | PRN

## 2017-10-23 MED ORDER — METOPROLOL TARTRATE 12.5 MG HALF TABLET
12.5000 mg | ORAL_TABLET | Freq: Two times a day (BID) | ORAL | Status: DC
  Administered 2017-10-24 (×2): 12.5 mg via ORAL
  Filled 2017-10-23 (×2): qty 1

## 2017-10-23 MED ORDER — VANCOMYCIN HCL IN DEXTROSE 1-5 GM/200ML-% IV SOLN
1000.0000 mg | Freq: Once | INTRAVENOUS | Status: AC
  Administered 2017-10-23: 1000 mg via INTRAVENOUS
  Filled 2017-10-23: qty 200

## 2017-10-23 MED ORDER — FAMOTIDINE IN NACL 20-0.9 MG/50ML-% IV SOLN
20.0000 mg | Freq: Two times a day (BID) | INTRAVENOUS | Status: AC
  Administered 2017-10-23: 20 mg via INTRAVENOUS

## 2017-10-23 MED ORDER — ACETAMINOPHEN 160 MG/5ML PO SOLN: 1000.0000 mg | Freq: Four times a day (QID) | ORAL | Status: DC

## 2017-10-23 MED ORDER — SODIUM CHLORIDE 0.9% FLUSH
10.0000 mL | Freq: Two times a day (BID) | INTRAVENOUS | Status: DC
  Administered 2017-10-23: 10 mL
  Administered 2017-10-24: 20 mL
  Administered 2017-10-24: 10 mL

## 2017-10-23 MED ORDER — ACETAMINOPHEN 650 MG RE SUPP
650.0000 mg | Freq: Once | RECTAL | Status: AC
  Administered 2017-10-23: 650 mg via RECTAL

## 2017-10-23 MED ORDER — OXYCODONE HCL 5 MG PO TABS
5.0000 mg | ORAL_TABLET | ORAL | Status: DC | PRN
  Administered 2017-10-24 (×2): 10 mg via ORAL
  Administered 2017-10-24: 5 mg via ORAL
  Filled 2017-10-23: qty 2
  Filled 2017-10-23: qty 1
  Filled 2017-10-23: qty 2

## 2017-10-23 MED ORDER — CHLORHEXIDINE GLUCONATE 0.12 % MT SOLN
15.0000 mL | OROMUCOSAL | Status: AC
  Administered 2017-10-23: 15 mL via OROMUCOSAL

## 2017-10-23 MED ORDER — SODIUM CHLORIDE 0.9 % IJ SOLN
OROMUCOSAL | Status: DC | PRN
  Administered 2017-10-23 (×3): 4 mL via TOPICAL

## 2017-10-23 MED ORDER — PANTOPRAZOLE SODIUM 40 MG PO TBEC: 40.0000 mg | DELAYED_RELEASE_TABLET | Freq: Every day | ORAL | Status: DC

## 2017-10-23 MED ORDER — CEFUROXIME SODIUM 1.5 G IV SOLR
1.5000 g | Freq: Two times a day (BID) | INTRAVENOUS | Status: DC
  Administered 2017-10-23 – 2017-10-24 (×3): 1.5 g via INTRAVENOUS
  Filled 2017-10-23 (×4): qty 1.5

## 2017-10-23 MED ORDER — MORPHINE SULFATE (PF) 4 MG/ML IV SOLN: 2.0000 mg | INTRAVENOUS | Status: DC | PRN

## 2017-10-23 MED ORDER — SODIUM CHLORIDE 0.45 % IV SOLN
INTRAVENOUS | Status: DC | PRN
  Administered 2017-10-23: 13:00:00 via INTRAVENOUS

## 2017-10-23 MED ORDER — LACTATED RINGERS IV SOLN
INTRAVENOUS | Status: DC | PRN
Start: 2017-10-23 — End: 2017-10-23
  Administered 2017-10-23: 07:00:00 via INTRAVENOUS

## 2017-10-23 MED ORDER — FLUTICASONE PROPIONATE 50 MCG/ACT NA SUSP
2.0000 | Freq: Every day | NASAL | Status: DC
  Filled 2017-10-23: qty 16

## 2017-10-23 MED ORDER — CHLORHEXIDINE GLUCONATE CLOTH 2 % EX PADS
6.0000 | MEDICATED_PAD | Freq: Every day | CUTANEOUS | Status: DC
  Administered 2017-10-24: 6 via TOPICAL

## 2017-10-23 MED ORDER — ACETAMINOPHEN 160 MG/5ML PO SOLN: 650.0000 mg | Freq: Once | ORAL | Status: AC

## 2017-10-23 MED ORDER — MORPHINE SULFATE (PF) 4 MG/ML IV SOLN
2.0000 mg | INTRAVENOUS | Status: DC | PRN
  Administered 2017-10-24 (×2): 4 mg via INTRAVENOUS
  Administered 2017-10-24: 2 mg via INTRAVENOUS
  Administered 2017-10-24 (×2): 4 mg via INTRAVENOUS
  Filled 2017-10-23 (×5): qty 1

## 2017-10-23 MED ORDER — SODIUM CHLORIDE 0.9 % IV SOLN: 250.0000 mL | INTRAVENOUS | Status: DC

## 2017-10-23 MED ORDER — SODIUM CHLORIDE 0.9% FLUSH: 3.0000 mL | INTRAVENOUS | Status: DC | PRN

## 2017-10-23 MED ORDER — ASPIRIN EC 325 MG PO TBEC
325.0000 mg | DELAYED_RELEASE_TABLET | Freq: Every day | ORAL | Status: DC
  Administered 2017-10-24: 325 mg via ORAL
  Filled 2017-10-23: qty 1

## 2017-10-23 MED ORDER — HEPARIN SODIUM (PORCINE) 1000 UNIT/ML IJ SOLN
INTRAMUSCULAR | Status: DC | PRN
  Administered 2017-10-23: 25000 [IU] via INTRAVENOUS

## 2017-10-23 MED ORDER — ROCURONIUM BROMIDE 10 MG/ML (PF) SYRINGE
PREFILLED_SYRINGE | INTRAVENOUS | Status: DC | PRN
  Administered 2017-10-23: 10 mg via INTRAVENOUS
  Administered 2017-10-23: 50 mg via INTRAVENOUS
  Administered 2017-10-23: 20 mg via INTRAVENOUS
  Administered 2017-10-23: 50 mg via INTRAVENOUS

## 2017-10-23 MED ORDER — MIDAZOLAM HCL 2 MG/2ML IJ SOLN: 2.0000 mg | INTRAMUSCULAR | Status: DC | PRN

## 2017-10-23 MED ORDER — INSULIN REGULAR HUMAN 100 UNIT/ML IJ SOLN: INTRAMUSCULAR | Status: DC

## 2017-10-23 MED ORDER — HYDROMORPHONE HCL 1 MG/ML IJ SOLN: 0.2500 mg | INTRAMUSCULAR | Status: DC | PRN

## 2017-10-23 MED ORDER — ASPIRIN 81 MG PO CHEW: 324.0000 mg | CHEWABLE_TABLET | Freq: Every day | ORAL | Status: DC

## 2017-10-23 MED ORDER — SODIUM CHLORIDE 0.9 % IV SOLN
INTRAVENOUS | Status: DC | PRN
  Administered 2017-10-23: 12:00:00 via INTRAVENOUS

## 2017-10-23 MED ORDER — FENTANYL CITRATE (PF) 250 MCG/5ML IJ SOLN
INTRAMUSCULAR | Status: DC | PRN
  Administered 2017-10-23 (×3): 100 ug via INTRAVENOUS
  Administered 2017-10-23 (×2): 50 ug via INTRAVENOUS
  Administered 2017-10-23: 1100 ug via INTRAVENOUS
  Administered 2017-10-23: 250 ug via INTRAVENOUS

## 2017-10-23 MED ORDER — NITROGLYCERIN IN D5W 200-5 MCG/ML-% IV SOLN: 0.0000 ug/min | INTRAVENOUS | Status: DC

## 2017-10-23 MED ORDER — PROTAMINE SULFATE 10 MG/ML IV SOLN
INTRAVENOUS | Status: DC | PRN
Start: 2017-10-23 — End: 2017-10-23
  Administered 2017-10-23: 250 mg via INTRAVENOUS

## 2017-10-23 MED ORDER — LACTATED RINGERS IV SOLN
500.0000 mL | Freq: Once | INTRAVENOUS | Status: AC | PRN
  Administered 2017-10-25: 500 mL via INTRAVENOUS

## 2017-10-23 MED ORDER — THROMBIN (RECOMBINANT) 5000 UNITS EX SOLR
CUTANEOUS | Status: DC | PRN
  Administered 2017-10-23: 5000 [IU] via TOPICAL

## 2017-10-23 MED ORDER — METOPROLOL TARTRATE 12.5 MG HALF TABLET: 12.5000 mg | ORAL_TABLET | Freq: Once | ORAL | Status: DC

## 2017-10-23 MED ORDER — INSULIN REGULAR BOLUS VIA INFUSION
0.0000 [IU] | Freq: Three times a day (TID) | INTRAVENOUS | Status: DC
  Administered 2017-10-24: 1 [IU] via INTRAVENOUS
  Filled 2017-10-23: qty 10

## 2017-10-23 MED ORDER — SODIUM CHLORIDE 0.9% FLUSH
3.0000 mL | Freq: Two times a day (BID) | INTRAVENOUS | Status: DC
  Administered 2017-10-24 (×2): 3 mL via INTRAVENOUS

## 2017-10-23 SURGICAL SUPPLY — 86 items
BAG DECANTER FOR FLEXI CONT (MISCELLANEOUS) ×3 IMPLANT
BANDAGE ACE 4X5 VEL STRL LF (GAUZE/BANDAGES/DRESSINGS) ×3 IMPLANT
BANDAGE ACE 6X5 VEL STRL LF (GAUZE/BANDAGES/DRESSINGS) ×3 IMPLANT
BLADE STERNUM SYSTEM 6 (BLADE) ×3 IMPLANT
BLADE SURG 11 STRL SS (BLADE) ×3 IMPLANT
BNDG GAUZE ELAST 4 BULKY (GAUZE/BANDAGES/DRESSINGS) ×3 IMPLANT
CANISTER SUCT 3000ML PPV (MISCELLANEOUS) ×3 IMPLANT
CATH CPB KIT GERHARDT (MISCELLANEOUS) ×3 IMPLANT
CATH THORACIC 28FR (CATHETERS) ×3 IMPLANT
CRADLE DONUT ADULT HEAD (MISCELLANEOUS) ×3 IMPLANT
DERMABOND ADVANCED (GAUZE/BANDAGES/DRESSINGS) ×1
DERMABOND ADVANCED .7 DNX12 (GAUZE/BANDAGES/DRESSINGS) ×2 IMPLANT
DRAIN CHANNEL 28F RND 3/8 FF (WOUND CARE) ×3 IMPLANT
DRAPE CARDIOVASCULAR INCISE (DRAPES) ×1
DRAPE SLUSH/WARMER DISC (DRAPES) ×3 IMPLANT
DRAPE SRG 135X102X78XABS (DRAPES) ×2 IMPLANT
DRSG AQUACEL AG ADV 3.5X14 (GAUZE/BANDAGES/DRESSINGS) ×3 IMPLANT
ELECT BLADE 4.0 EZ CLEAN MEGAD (MISCELLANEOUS) ×3
ELECT REM PT RETURN 9FT ADLT (ELECTROSURGICAL) ×6
ELECTRODE BLDE 4.0 EZ CLN MEGD (MISCELLANEOUS) ×2 IMPLANT
ELECTRODE REM PT RTRN 9FT ADLT (ELECTROSURGICAL) ×4 IMPLANT
FELT TEFLON 1X6 (MISCELLANEOUS) ×6 IMPLANT
GAUZE SPONGE 4X4 12PLY STRL (GAUZE/BANDAGES/DRESSINGS) ×3 IMPLANT
GAUZE SPONGE 4X4 12PLY STRL LF (GAUZE/BANDAGES/DRESSINGS) ×3 IMPLANT
GLOVE BIO SURGEON STRL SZ 6.5 (GLOVE) ×27 IMPLANT
GOWN STRL REUS W/ TWL LRG LVL3 (GOWN DISPOSABLE) ×8 IMPLANT
GOWN STRL REUS W/TWL LRG LVL3 (GOWN DISPOSABLE) ×4
HEMOSTAT POWDER SURGIFOAM 1G (HEMOSTASIS) ×9 IMPLANT
HEMOSTAT SURGICEL 2X14 (HEMOSTASIS) ×3 IMPLANT
KIT BASIN OR (CUSTOM PROCEDURE TRAY) ×3 IMPLANT
KIT CATH SUCT 8FR (CATHETERS) ×3 IMPLANT
KIT ROOM TURNOVER OR (KITS) ×3 IMPLANT
KIT SUCTION CATH 14FR (SUCTIONS) ×6 IMPLANT
KIT VASOVIEW HEMOPRO VH 3000 (KITS) ×3 IMPLANT
LEAD PACING MYOCARDI (MISCELLANEOUS) ×3 IMPLANT
MARKER GRAFT CORONARY BYPASS (MISCELLANEOUS) ×9 IMPLANT
NS IRRIG 1000ML POUR BTL (IV SOLUTION) ×18 IMPLANT
PACK OPEN HEART (CUSTOM PROCEDURE TRAY) ×3 IMPLANT
PAD ARMBOARD 7.5X6 YLW CONV (MISCELLANEOUS) ×6 IMPLANT
PAD ELECT DEFIB RADIOL ZOLL (MISCELLANEOUS) ×3 IMPLANT
PENCIL BUTTON HOLSTER BLD 10FT (ELECTRODE) ×3 IMPLANT
PUNCH AORTIC ROT 4.0MM RCL 40 (MISCELLANEOUS) ×3 IMPLANT
SET CARDIOPLEGIA MPS 5001102 (MISCELLANEOUS) ×3 IMPLANT
SPONGE LAP 18X18 X RAY DECT (DISPOSABLE) ×9 IMPLANT
SUT BONE WAX W31G (SUTURE) ×3 IMPLANT
SUT ETHIBOND 2 0 SH (SUTURE) ×4
SUT ETHIBOND 2 0 SH 36X2 (SUTURE) ×8 IMPLANT
SUT MNCRL AB 4-0 PS2 18 (SUTURE) ×3 IMPLANT
SUT PROLENE 3 0 SH1 36 (SUTURE) ×3 IMPLANT
SUT PROLENE 4 0 RB 1 (SUTURE) ×1
SUT PROLENE 4 0 TF (SUTURE) ×6 IMPLANT
SUT PROLENE 4-0 RB1 .5 CRCL 36 (SUTURE) ×2 IMPLANT
SUT PROLENE 5 0 C 1 36 (SUTURE) ×6 IMPLANT
SUT PROLENE 6 0 C 1 30 (SUTURE) ×6 IMPLANT
SUT PROLENE 6 0 CC (SUTURE) ×6 IMPLANT
SUT PROLENE 7 0 BV1 MDA (SUTURE) ×6 IMPLANT
SUT PROLENE 8 0 BV175 6 (SUTURE) ×15 IMPLANT
SUT SILK  1 MH (SUTURE) ×3
SUT SILK 1 MH (SUTURE) ×6 IMPLANT
SUT SILK 1 TIES 10X30 (SUTURE) ×3 IMPLANT
SUT SILK 2 0 SH CR/8 (SUTURE) ×6 IMPLANT
SUT SILK 2 0 TIES 10X30 (SUTURE) ×3 IMPLANT
SUT SILK 2 0 TIES 17X18 (SUTURE) ×1
SUT SILK 2-0 18XBRD TIE BLK (SUTURE) ×2 IMPLANT
SUT SILK 3 0 SH CR/8 (SUTURE) ×3 IMPLANT
SUT SILK 4 0 TIE 10X30 (SUTURE) ×6 IMPLANT
SUT STEEL 6MS V (SUTURE) ×3 IMPLANT
SUT STEEL SZ 6 DBL 3X14 BALL (SUTURE) ×3 IMPLANT
SUT TEM PAC WIRE 2 0 SH (SUTURE) ×9 IMPLANT
SUT VIC AB 1 CTX 18 (SUTURE) ×6 IMPLANT
SUT VIC AB 2-0 CT1 27 (SUTURE) ×1
SUT VIC AB 2-0 CT1 TAPERPNT 27 (SUTURE) ×2 IMPLANT
SUT VIC AB 2-0 CTX 27 (SUTURE) ×6 IMPLANT
SUT VIC AB 3-0 X1 27 (SUTURE) ×6 IMPLANT
SUTURE E-PAK OPEN HEART (SUTURE) IMPLANT
SYSTEM SAHARA CHEST DRAIN ATS (WOUND CARE) ×3 IMPLANT
TAPE CLOTH SURG 4X10 WHT LF (GAUZE/BANDAGES/DRESSINGS) ×3 IMPLANT
TAPE PAPER 2X10 WHT MICROPORE (GAUZE/BANDAGES/DRESSINGS) ×3 IMPLANT
TOWEL GREEN STERILE (TOWEL DISPOSABLE) ×12 IMPLANT
TOWEL GREEN STERILE FF (TOWEL DISPOSABLE) ×6 IMPLANT
TOWEL OR 17X24 6PK STRL BLUE (TOWEL DISPOSABLE) ×3 IMPLANT
TOWEL OR 17X26 10 PK STRL BLUE (TOWEL DISPOSABLE) ×3 IMPLANT
TRAY FOLEY SILVER 16FR TEMP (SET/KITS/TRAYS/PACK) ×3 IMPLANT
TUBING INSUFFLATION (TUBING) ×3 IMPLANT
UNDERPAD 30X30 (UNDERPADS AND DIAPERS) ×3 IMPLANT
WATER STERILE IRR 1000ML POUR (IV SOLUTION) ×6 IMPLANT

## 2017-10-23 NOTE — Procedures (Signed)
Extubation Procedure Note  Patient Details:   Name: Kayla Malone DOB: [birth year] MRN: [id number]   Airway Documentation:   Patient NIF and VC within parameters. Extubated per protocol. Positive cuff leak. Patient able to follow commands appropriately. Patient extubated to 4lpm humidified oxygen. Vitals within normal limits. RRT will continue to monitor.  Evaluation  O2 sats: stable throughout Complications: No apparent complications Patient did tolerate procedure well. Bilateral Breath Sounds: Clear   Yes  Ander Purpura 10/23/2017, 5:12 PM

## 2017-10-23 NOTE — Anesthesia Preprocedure Evaluation (Addendum)
Anesthesia Evaluation  Patient identified by MRN, date of birth, ID band Patient awake    Reviewed: Allergy & Precautions, NPO status , Patient's Chart, lab work & pertinent test results, reviewed documented beta blocker date and time   Airway Mallampati: II  TM Distance: >3 FB Neck ROM: Full    Dental  (+) Chipped, Dental Advisory Given   Pulmonary asthma , former smoker,    breath sounds clear to auscultation- rhonchi       Cardiovascular hypertension, Pt. on medications + angina + CAD and + Peripheral Vascular Disease   Rhythm:Regular Rate:Normal     Neuro/Psych PSYCHIATRIC DISORDERS Depression CVA    GI/Hepatic Neg liver ROS, hiatal hernia, GERD  Controlled,  Endo/Other  diabetes, Type 2, Oral Hypoglycemic Agents  Renal/GU negative Renal ROS     Musculoskeletal negative musculoskeletal ROS (+)   Abdominal   Peds  Hematology   Anesthesia Other Findings Overbite. L weakness. Memory and speech affected.  Reproductive/Obstetrics negative OB ROS                            Anesthesia Physical  Anesthesia Plan  ASA: III  Anesthesia Plan: General   Post-op Pain Management:    Induction: Intravenous  PONV Risk Score and Plan: 3 and Treatment may vary due to age or medical condition, Ondansetron and Midazolam  Airway Management Planned: Oral ETT  Additional Equipment: Arterial line, CVP, TEE, PA Cath and Ultrasound Guidance Line Placement  Intra-op Plan:   Post-operative Plan: Post-operative intubation/ventilation  Informed Consent: I have reviewed the patients History and Physical, chart, labs and discussed the procedure including the risks, benefits and alternatives for the proposed anesthesia with the patient or authorized representative who has indicated his/her understanding and acceptance.     Plan Discussed with: CRNA and Surgeon  Anesthesia Plan Comments:         Anesthesia Quick Evaluation

## 2017-10-23 NOTE — Anesthesia Procedure Notes (Signed)
Procedure Name: Intubation Date/Time: 10/23/2017 7:45 AM Performed by: Candis Shine, CRNA Pre-anesthesia Checklist: Patient identified, Emergency Drugs available, Suction available and Patient being monitored Patient Re-evaluated:Patient Re-evaluated prior to induction Oxygen Delivery Method: Circle System Utilized Preoxygenation: Pre-oxygenation with 100% oxygen Induction Type: IV induction Ventilation: Mask ventilation without difficulty Laryngoscope Size: Mac and 3 Grade View: Grade II Tube type: Oral Tube size: 8.0 mm Number of attempts: 1 Airway Equipment and Method: Stylet Placement Confirmation: ETT inserted through vocal cords under direct vision,  positive ETCO2 and breath sounds checked- equal and bilateral Secured at: 22 cm Tube secured with: Tape Dental Injury: Teeth and Oropharynx as per pre-operative assessment

## 2017-10-23 NOTE — Anesthesia Procedure Notes (Signed)
Central Venous Catheter Insertion Performed by: Audry Pili, MD, anesthesiologist Start/End11/11/2017 6:54 AM, 10/23/2017 6:59 AM Patient location: Pre-op. Preanesthetic checklist: patient identified, IV checked, risks and benefits discussed, surgical consent, monitors and equipment checked, pre-op evaluation, timeout performed and anesthesia consent Hand hygiene performed  and maximum sterile barriers used  Total catheter length 10. PA cath was placed.Swan type:thermodilution PA Cath depth:47 Procedure performed without using ultrasound guided technique. Attempts: 1 Patient tolerated the procedure well with no immediate complications.

## 2017-10-23 NOTE — Progress Notes (Signed)
Patient rec'd CHG wipes to possible surgical areas, hair in groin clipped, vitals stable WNL, NSR, no c/o pain or CP, no SOB in ambulating to bathroom, voided urine, no s/s of bleeding, belongings packed up, psychosocial WNL.  R hand 20g intact and infusing heparin and Nitroglycerine gtts.   Family informed and at bedside.

## 2017-10-23 NOTE — Brief Op Note (Addendum)
      [address]       [place] [address]             [phone]      10/23/2017  10:53 AM  PATIENT:  Kayla Malone  56 y.o. female  PRE-OPERATIVE DIAGNOSIS:  Coronary artrey disease  POST-OPERATIVE DIAGNOSIS:  Coronary artrey disease  PROCEDURE:  TRANSESOPHAGEAL ECHOCARDIOGRAM (TEE), MEDIAN STERNOTOMY for CORONARY ARTERY BYPASS GRAFTING (CABG) x 2 (LIMA to LAD, SVG to RCA)  using left internal mammary artery and right leg greater saphenous vein harvested endoscopically   SURGEON:  Surgeon(s) and Role:    Grace Isaac, MD - Primary  PHYSICIAN ASSISTANT: Lars Pinks PA-C  ASSISTANTS: Stacy Gardner RNFA  ANESTHESIA:   general  DRAINS: Chest tubes placed in the mediastinal and pleural spaces   DICTATION: .Dragon Dictation  PLAN OF CARE: Admit to inpatient   PATIENT DISPOSITION:  ICU - intubated and hemodynamically stable.   Delay start of Pharmacological VTE agent (>24hrs) due to surgical blood loss or risk of bleeding: yes  BASELINE WEIGHT: 69.4 kg

## 2017-10-23 NOTE — Transfer of Care (Signed)
Immediate Anesthesia Transfer of Care Note  Patient: Kayla Malone  Procedure(s) Performed: CORONARY ARTERY BYPASS GRAFTING (CABG) x two  , using left internal mammary artery and right leg greater saphenous vein harvested  endoscopically - LIMA to LAD, SVG to RCA (N/A Chest) TRANSESOPHAGEAL ECHOCARDIOGRAM (TEE) (N/A )  Patient Location: ICU  Anesthesia Type:General  Level of Consciousness: sedated and Patient remains intubated per anesthesia plan  Airway & Oxygen Therapy: Patient remains intubated per anesthesia plan and Patient placed on Ventilator (see vital sign flow sheet for setting)  Post-op Assessment: Report given to RN and Post -op Vital signs reviewed and stable  Post vital signs: Reviewed and stable  Last Vitals:  Vitals:   10/23/17 0707 10/23/17 0716  BP:    Pulse: 68 71  Resp: 16 14  Temp:    SpO2: 100% 100%    Last Pain:  Vitals:   10/23/17 0151  TempSrc:   PainSc: 0-No pain      Patients Stated Pain Goal: 0 (69/79/48 0165)  Complications: No apparent anesthesia complications

## 2017-10-23 NOTE — OR Nursing (Signed)
11:45am - 45 minute call to SICU nurse 12:15 pm - 20 minute call to SICU nurse

## 2017-10-23 NOTE — Anesthesia Procedure Notes (Signed)
Arterial Line Insertion Start/End11/11/2017 7:00 AM Performed by: Izora Gala, CRNA, CRNA  Patient location: Pre-op. Preanesthetic checklist: patient identified, IV checked, site marked, risks and benefits discussed, surgical consent, monitors and equipment checked, pre-op evaluation, timeout performed and anesthesia consent Lidocaine 1% used for infiltration Right, radial was placed Catheter size: 20 G Hand hygiene performed  and maximum sterile barriers used   Attempts: 1 Procedure performed without using ultrasound guided technique. Following insertion, dressing applied. Post procedure assessment: normal and unchanged  Patient tolerated the procedure well with no immediate complications.

## 2017-10-23 NOTE — Progress Notes (Signed)
  Echocardiogram Echocardiogram Transesophageal has been performed.  Kayla Malone M 10/23/2017, 7:56 AM

## 2017-10-23 NOTE — Progress Notes (Signed)
      [address]       [place] [address]             [phone]     Pre Procedure note for inpatients:   Kayla Malone has been scheduled for Procedure(s): CORONARY ARTERY BYPASS GRAFTING (CABG),TEE (N/A) TRANSESOPHAGEAL ECHOCARDIOGRAM (TEE) (N/A) today. The various methods of treatment have been discussed with the patient. After consideration of the risks, benefits and treatment options the patient has consented to the planned procedure.   The patient has been seen and labs reviewed. There are no changes in the patient's condition to prevent proceeding with the planned procedure today.  Recent labs:  Lab Results  Component Value Date   WBC 6.3 10/23/2017   HGB 12.4 10/23/2017   HCT 37.3 10/23/2017   PLT 353 10/23/2017   GLUCOSE 144 (H) 10/23/2017   ALT 12 (L) 10/22/2017   AST 17 10/22/2017   NA 138 10/23/2017   K 3.5 10/23/2017   CL 104 10/23/2017   CREATININE 0.81 10/23/2017   BUN 7 10/23/2017   CO2 25 10/23/2017   INR 0.99 10/22/2017    Grace Isaac, MD 10/23/2017 6:59 AM

## 2017-10-23 NOTE — Progress Notes (Signed)
Patient ID: Kayla Malone, female   DOB: 19-Feb-1961, 56 y.o.   MRN: [id number] EVENING ROUNDS NOTE :     [address]       [place] [address]             [phone]                 Day of Surgery Procedure(s) (LRB): CORONARY ARTERY BYPASS GRAFTING (CABG) x two  , using left internal mammary artery and right leg greater saphenous vein harvested  endoscopically - LIMA to LAD, SVG to RCA (N/A) TRANSESOPHAGEAL ECHOCARDIOGRAM (TEE) (N/A)  Total Length of Stay:  LOS: 3 days  BP (!) 158/91   Pulse 87   Temp (!) 97.2 F (36.2 C)   Resp 19   Ht 5\' 3"  (1.6 m)   Wt 153 lb 9.6 oz (69.7 kg)   SpO2 100%   BMI 27.21 kg/m   .Intake/Output      11/11 0701 - 11/12 0700 11/12 0701 - 11/13 0700   P.O. 480    I.V. (mL/kg) 286.2 (4.1) 2393.6 (34.3)   Blood  300   NG/GT  0   IV Piggyback  970   Total Intake(mL/kg) 766.2 (11) 3663.6 (52.6)   Urine (mL/kg/hr) 1 (0) 2330 (3.1)   Emesis/NG output  200   Blood  400   Chest Tube  90   Total Output 1 3020   Net +765.2 +643.6        Urine Occurrence 5 x    Stool Occurrence 3 x      . sodium chloride    . [START ON 10/24/2017] sodium chloride    . sodium chloride 20 mL/hr at 10/23/17 1700  . albumin human    . cefUROXime (ZINACEF)  IV    . dexmedetomidine (PRECEDEX) IV infusion Stopped (10/23/17 1450)  . famotidine (PEPCID) IV Stopped (10/23/17 1337)  . insulin (NOVOLIN-R) infusion 1 Units/hr (10/23/17 1620)  . lactated ringers    . lactated ringers 20 mL/hr at 10/23/17 1700  . lactated ringers 20 mL/hr at 10/23/17 1700  . magnesium sulfate 4 g (10/23/17 1353)  . nitroGLYCERIN Stopped (10/23/17 1500)  . phenylephrine (NEO-SYNEPHRINE) Adult infusion Stopped (10/23/17 1500)  . vancomycin       Lab Results  Component Value Date   WBC 13.1 (H) 10/23/2017   HGB 12.1 10/23/2017   HCT 36.1 10/23/2017   PLT 215 10/23/2017   GLUCOSE 115 (H) 10/23/2017   ALT 12 (L) 10/23/2017   AST 27 10/23/2017   NA 141  10/23/2017   K 3.3 (L) 10/23/2017   CL 111 10/23/2017   CREATININE 0.98 10/23/2017   BUN <5 (L) 10/23/2017   CO2 25 10/23/2017   INR 1.35 10/23/2017   HGBA1C 7.3 (H) 10/23/2017   Now extubated, alert neuro intact   Grace Isaac MD  Beeper [phone] Office [phone] 10/23/2017 5:53 PM

## 2017-10-23 NOTE — Anesthesia Postprocedure Evaluation (Signed)
Anesthesia Post Note  Patient: Kayla Malone  Procedure(s) Performed: CORONARY ARTERY BYPASS GRAFTING (CABG) x two  , using left internal mammary artery and right leg greater saphenous vein harvested  endoscopically - LIMA to LAD, SVG to RCA (N/A Chest) TRANSESOPHAGEAL ECHOCARDIOGRAM (TEE) (N/A )     Patient location during evaluation: SICU Anesthesia Type: General Level of consciousness: sedated Pain management: pain level controlled Vital Signs Assessment: post-procedure vital signs reviewed and stable Respiratory status: patient remains intubated per anesthesia plan Cardiovascular status: stable Postop Assessment: no apparent nausea or vomiting Anesthetic complications: no    Last Vitals:  Vitals:   10/23/17 1515 10/23/17 1538  BP:  132/85  Pulse:  86  Resp: 13 12  Temp: (!) 35.7 C (!) 36.2 C  SpO2: 100% 100%    Last Pain:  Vitals:   10/23/17 1538  TempSrc: Core (Comment)  PainSc:                  Issaih Kaus [name]

## 2017-10-23 NOTE — Anesthesia Procedure Notes (Signed)
Central Venous Catheter Insertion Performed by: Audry Pili, MD, anesthesiologist Start/End11/11/2017 6:38 AM, 10/23/2017 6:59 AM Patient location: Pre-op. Preanesthetic checklist: patient identified, IV checked, risks and benefits discussed, surgical consent, monitors and equipment checked, pre-op evaluation, timeout performed and anesthesia consent Position: Trendelenburg Lidocaine 1% used for infiltration and patient sedated Hand hygiene performed , maximum sterile barriers used  and Seldinger technique used Catheter size: 8.5 Fr Total catheter length 10. Central line was placed.MAC introducer Swan type:thermodilution PA Cath depth:47 Procedure performed using ultrasound guided technique. Ultrasound Notes:anatomy identified, needle tip was noted to be adjacent to the nerve/plexus identified, no ultrasound evidence of intravascular and/or intraneural injection and image(s) printed for medical record Attempts: 1 Following insertion, line sutured, dressing applied and Biopatch. Post procedure assessment: blood return through all ports, free fluid flow and no air  Patient tolerated the procedure well with no immediate complications.

## 2017-10-24 ENCOUNTER — Encounter (HOSPITAL_COMMUNITY): Payer: Self-pay | Admitting: Cardiothoracic Surgery

## 2017-10-24 ENCOUNTER — Inpatient Hospital Stay (HOSPITAL_COMMUNITY): Payer: Medicare Other

## 2017-10-24 LAB — GLUCOSE, CAPILLARY
GLUCOSE-CAPILLARY: 102 mg/dL — AB (ref 65–99)
GLUCOSE-CAPILLARY: 102 mg/dL — AB (ref 65–99)
GLUCOSE-CAPILLARY: 116 mg/dL — AB (ref 65–99)
GLUCOSE-CAPILLARY: 151 mg/dL — AB (ref 65–99)
GLUCOSE-CAPILLARY: 161 mg/dL — AB (ref 65–99)
GLUCOSE-CAPILLARY: 95 mg/dL (ref 65–99)
Glucose-Capillary: 104 mg/dL — ABNORMAL HIGH (ref 65–99)
Glucose-Capillary: 98 mg/dL (ref 65–99)
Glucose-Capillary: 113 mg/dL — ABNORMAL HIGH (ref 65–99)
Glucose-Capillary: 105 mg/dL — ABNORMAL HIGH (ref 65–99)
Glucose-Capillary: 106 mg/dL — ABNORMAL HIGH (ref 65–99)
Glucose-Capillary: 96 mg/dL (ref 65–99)
Glucose-Capillary: 98 mg/dL (ref 65–99)
Glucose-Capillary: 143 mg/dL — ABNORMAL HIGH (ref 65–99)
Glucose-Capillary: 137 mg/dL — ABNORMAL HIGH (ref 65–99)

## 2017-10-24 LAB — CBC
HEMATOCRIT: 31.1 % — AB (ref 36.0–46.0)
HEMATOCRIT: 30.3 % — AB (ref 36.0–46.0)
Hemoglobin: 10.3 g/dL — ABNORMAL LOW (ref 12.0–15.0)
Hemoglobin: 9.9 g/dL — ABNORMAL LOW (ref 12.0–15.0)
MCH: 27 pg (ref 26.0–34.0)
MCH: 26.8 pg (ref 26.0–34.0)
MCHC: 33.1 g/dL (ref 30.0–36.0)
MCHC: 32.7 g/dL (ref 30.0–36.0)
MCV: 81.4 fL (ref 78.0–100.0)
MCV: 81.9 fL (ref 78.0–100.0)
PLATELETS: 194 10*3/uL (ref 150–400)
Platelets: 189 10*3/uL (ref 150–400)
RBC: 3.82 MIL/uL — ABNORMAL LOW (ref 3.87–5.11)
RBC: 3.7 MIL/uL — ABNORMAL LOW (ref 3.87–5.11)
RDW: 13.1 % (ref 11.5–15.5)
RDW: 13.1 % (ref 11.5–15.5)
WBC: 10.7 10*3/uL — ABNORMAL HIGH (ref 4.0–10.5)
WBC: 11.3 10*3/uL — ABNORMAL HIGH (ref 4.0–10.5)

## 2017-10-24 LAB — POCT I-STAT, CHEM 8
BUN: 6 mg/dL (ref 6–20)
CHLORIDE: 96 mmol/L — AB (ref 101–111)
Calcium, Ion: 1.22 mmol/L (ref 1.15–1.40)
Creatinine, Ser: 0.7 mg/dL (ref 0.44–1.00)
Glucose, Bld: 151 mg/dL — ABNORMAL HIGH (ref 65–99)
HEMATOCRIT: 30 % — AB (ref 36.0–46.0)
Hemoglobin: 10.2 g/dL — ABNORMAL LOW (ref 12.0–15.0)
POTASSIUM: 3.8 mmol/L (ref 3.5–5.1)
Sodium: 136 mmol/L (ref 135–145)
TCO2: 28 mmol/L (ref 22–32)

## 2017-10-24 LAB — BASIC METABOLIC PANEL
Anion gap: 6 (ref 5–15)
BUN: 6 mg/dL (ref 6–20)
CALCIUM: 8.4 mg/dL — AB (ref 8.9–10.3)
CO2: 25 mmol/L (ref 22–32)
Chloride: 105 mmol/L (ref 101–111)
Creatinine, Ser: 0.7 mg/dL (ref 0.44–1.00)
GFR calc Af Amer: >60 mL/min (ref >60)
GLUCOSE: 107 mg/dL — AB (ref 65–99)
Potassium: 3.9 mmol/L (ref 3.5–5.1)
Sodium: 136 mmol/L (ref 135–145)

## 2017-10-24 LAB — CREATININE, SERUM
CREATININE: 0.76 mg/dL (ref 0.44–1.00)
GFR calc Af Amer: >60 mL/min (ref >60)

## 2017-10-24 LAB — MAGNESIUM
Magnesium: 2.1 mg/dL (ref 1.7–2.4)
Magnesium: 1.9 mg/dL (ref 1.7–2.4)

## 2017-10-24 MED ORDER — INSULIN DETEMIR 100 UNIT/ML ~~LOC~~ SOLN
15.0000 [IU] | Freq: Once | SUBCUTANEOUS | Status: AC
  Administered 2017-10-24: 15 [IU] via SUBCUTANEOUS
  Filled 2017-10-24: qty 0.15

## 2017-10-24 MED ORDER — PANTOPRAZOLE SODIUM 40 MG PO TBEC
40.0000 mg | DELAYED_RELEASE_TABLET | Freq: Every day | ORAL | Status: DC
  Administered 2017-10-24: 40 mg via ORAL

## 2017-10-24 MED ORDER — ENOXAPARIN SODIUM 30 MG/0.3ML ~~LOC~~ SOLN
30.0000 mg | Freq: Every day | SUBCUTANEOUS | Status: DC
Start: 2017-10-24 — End: 2017-10-28
  Administered 2017-10-24 – 2017-10-27 (×4): 30 mg via SUBCUTANEOUS
  Filled 2017-10-24 (×4): qty 0.3

## 2017-10-24 MED ORDER — INSULIN ASPART 100 UNIT/ML ~~LOC~~ SOLN
0.0000 [IU] | SUBCUTANEOUS | Status: DC
  Administered 2017-10-24: 4 [IU] via SUBCUTANEOUS
  Administered 2017-10-24 (×2): 2 [IU] via SUBCUTANEOUS

## 2017-10-24 MED ORDER — CALCIUM CARBONATE ANTACID 500 MG PO CHEW
200.0000 mg | CHEWABLE_TABLET | Freq: Two times a day (BID) | ORAL | Status: DC | PRN
  Administered 2017-10-24 (×2): 200 mg via ORAL
  Filled 2017-10-24: qty 1

## 2017-10-24 MED ORDER — INSULIN DETEMIR 100 UNIT/ML ~~LOC~~ SOLN
15.0000 [IU] | Freq: Every day | SUBCUTANEOUS | Status: DC
  Administered 2017-10-25: 15 [IU] via SUBCUTANEOUS
  Filled 2017-10-24 (×2): qty 0.15

## 2017-10-24 NOTE — Evaluation (Signed)
Physical Therapy Evaluation Patient Details Name: Kayla Malone MRN: [id number] DOB: 1961-11-17 Today's Date: 10/24/2017   History of Present Illness  56 year old female admtited 11/9 with chest pain. She was awaiting CABG Monday with Dr. Servando Snare. Pt s/p CABGx3 on 11/12. Pt with h/o stroke and L sided residual weakness.  Clinical Impression  Pt admitted with above. Pt tolerated OOB mobility and ambulation well for first time. Pt will need to be at mod I level of function prior to d/c home. Anticipate pt to progress well. Acute PT to con't to follow.    Follow Up Recommendations No PT follow up;Supervision/Assistance - 24 hour    Equipment Recommendations  Rolling walker with 5" wheels    Recommendations for Other Services       Precautions / Restrictions Precautions Precautions: Sternal;Fall Restrictions Weight Bearing Restrictions: Yes Other Position/Activity Restrictions: lifting restricting due to sternal precautions      Mobility  Bed Mobility Overal bed mobility: Needs Assistance Bed Mobility: Supine to Sit     Supine to sit: Min assist     General bed mobility comments: pt able to bring LEs off EOB, minA for trunk elevation, pt hugged heart pillow  Transfers Overall transfer level: Needs assistance Equipment used: None Transfers: Sit to/from Stand Sit to Stand: Min assist         General transfer comment: used rocking momentum while holding onto heart pillow, pt able to push up well with LEs  Ambulation/Gait Ambulation/Gait assistance: Min assist;+2 safety/equipment Ambulation Distance (Feet): 120 Feet Assistive device: Rolling walker (2 wheeled) Gait Pattern/deviations: Step-through pattern;Decreased stride length Gait velocity: slow Gait velocity interpretation: Below normal speed for age/gender General Gait Details: 4 standing rest breaks due to fatigue, pt with diapheresis with c/o nausea.   Stairs            Wheelchair Mobility     Modified Rankin (Stroke Patients Only)       Balance Overall balance assessment: Needs assistance Sitting-balance support: Feet supported;No upper extremity supported Sitting balance-Leahy Scale: Fair     Standing balance support: No upper extremity supported Standing balance-Leahy Scale: Fair Standing balance comment: dependent on RW for amb                              Pertinent Vitals/Pain Pain Assessment: Faces Faces Pain Scale: Hurts little more Pain Location: sternal incision with mobiltiy Pain Descriptors / Indicators: Discomfort Pain Intervention(s): Monitored during session    Home Living Family/patient expects to be discharged to:: Private residence Living Arrangements: Spouse/significant other;Children Available Help at Discharge: Family;Available PRN/intermittently Type of Home: House Home Access: Stairs to enter Entrance Stairs-Rails: None Entrance Stairs-Number of Steps: 4 Home Layout: One level Home Equipment: None      Prior Function Level of Independence: Independent         Comments: was driving and doing all ADLs without assist     Hand Dominance   Dominant Hand: Right    Extremity/Trunk Assessment   Upper Extremity Assessment Upper Extremity Assessment: Generalized weakness    Lower Extremity Assessment Lower Extremity Assessment: Generalized weakness(L at baseline 4/5)    Cervical / Trunk Assessment Cervical / Trunk Assessment: Other exceptions(sternal incision)  Communication   Communication: No difficulties(soft spoken)  Cognition Arousal/Alertness: Awake/alert Behavior During Therapy: WFL for tasks assessed/performed Overall Cognitive Status: Within Functional Limits for tasks assessed  General Comments: pt initially was sleepy with difficulty staying awake but once walking pt alert and awake      General Comments      Exercises     Assessment/Plan    PT  Assessment Patient needs continued PT services  PT Problem List Decreased strength;Decreased activity tolerance;Decreased balance;Decreased mobility;Decreased knowledge of use of DME       PT Treatment Interventions DME instruction;Gait training;Stair training;Functional mobility training;Therapeutic activities;Therapeutic exercise;Balance training    PT Goals (Current goals can be found in the Care Plan section)  Acute Rehab PT Goals Patient Stated Goal: didn't state PT Goal Formulation: With patient Time For Goal Achievement: 10/31/17 Potential to Achieve Goals: Good    Frequency Min 3X/week   Barriers to discharge Decreased caregiver support limited assist    Co-evaluation               AM-PAC PT "6 Clicks" Daily Activity  Outcome Measure Difficulty turning over in bed (including adjusting bedclothes, sheets and blankets)?: Unable Difficulty moving from lying on back to sitting on the side of the bed? : Unable Difficulty sitting down on and standing up from a chair with arms (e.g., wheelchair, bedside commode, etc,.)?: Unable Help needed moving to and from a bed to chair (including a wheelchair)?: A Little Help needed walking in hospital room?: A Little Help needed climbing 3-5 steps with a railing? : A Lot 6 Click Score: 11    End of Session Equipment Utilized During Treatment: Gait belt;Oxygen Activity Tolerance: Patient limited by fatigue Patient left: in chair;with call bell/phone within reach;with nursing/sitter in room;with family/visitor present Nurse Communication: Mobility status(RN present for session) PT Visit Diagnosis: Unsteadiness on feet (R26.81)    Time: 1059-1130 PT Time Calculation (min) (ACUTE ONLY): 31 min   Charges:   PT Evaluation $PT Eval Moderate Complexity: 1 Mod PT Treatments $Gait Training: 8-22 mins   PT G Codes:        Kittie Plater, PT, DPT Pager #: [phone] Office #: [phone]   [place] 10/24/2017, 2:15  PM

## 2017-10-24 NOTE — Op Note (Signed)
Kayla Malone, Kayla Malone          ACCOUNT NO.:  [id number]  MEDICAL RECORD NO.:  [id number]  LOCATION:  [id number]                        FACILITY:  [facility]  PHYSICIAN:  Lanelle Bal, MD    DATE OF BIRTH:  03-20-1961  DATE OF PROCEDURE:  10/20/2017 DATE OF DISCHARGE:                              OPERATIVE REPORT   PREOPERATIVE DIAGNOSIS:  Coronary occlusive disease with unstable angina.  POSTOPERATIVE DIAGNOSIS:  Coronary occlusive disease with unstable angina.  SURGICAL PROCEDURE:  Coronary artery bypass grafting x2 with the left internal mammary to the left anterior descending coronary artery and reversed saphenous vein graft to the posterior descending coronary artery with right thigh greater saphenous endo vein harvesting.  SURGEON:  Lanelle Bal, MD.  FIRST ASSISTANT:  Lars Pinks, PA.  BRIEF HISTORY:  The patient is a 56 year old diabetic female with previous history of stroke, who presents with increasing anginal symptoms with exertion.  She underwent cardiac catheterization at [facility], which demonstrated a complex high-grade proximal LAD obstruction and sequential 80% lesions of the right coronary artery. She had some mild narrowing of the left main coronary artery, but without high-grade obstruction.  Overall, ventricular function was preserved.  Because of the patient's symptoms and complex proximal LAD obstruction, coronary artery bypass grafting was recommended to the patient who agreed and signed informed consent.  DESCRIPTION OF PROCEDURE:  With Swan-Ganz and arterial line monitors in place, the patient underwent general endotracheal anesthesia without incidence.  Skin of the chest and legs were prepped with Betadine and draped in usual sterile manner.  Appropriate time-out was performed.  We proceeded with harvesting of the right greater saphenous vein endoscopically.  The vein was of uniform caliber and of good quality. Median sternotomy was  performed.  Left internal mammary artery was dissected down as a pedicle graft.  The distal artery was divided and had excellent free flow.  The pericardium was opened.  Overall ventricular function appeared preserved.  TEE probe also confirmed no evidence of valvular disease and good LV function.  The patient was systemically heparinized.  Ascending aorta was cannulated.  The right atrium was cannulated.  An aortic root vent cardioplegia needle was introduced into the ascending aorta.  The patient was placed on cardiopulmonary bypass at 2.4 L/min/m2.  Sites of anastomosis were selected and dissected out of the epicardium.  The patient's body temperature was cooled to 32 degrees.  Aortic crossclamp was applied. 500 mL of cold blood potassium cardioplegia was administered antegrade. Attention was turned first to the posterior descending coronary artery, which was opened and admitted a 1.5-mm probe.  Using a running 7-0 Prolene, distal anastomosis was performed with a segment of reversed saphenous vein graft.  Attention was then turned to the left anterior descending coronary artery, and the midportion of the vessel was opened and was of good quality.  Using a 1.5-mm probe, it passed distally. Using a running 8-0 Prolene, left internal mammary artery was anastomosed to the left anterior descending coronary artery.  With the bulldog still in place, the aortic cardioplegia needle was removed and a punch aortotomy was created and a single vein graft was anastomosed to the ascending aorta.  The bulldog was then removed from the mammary artery  with prompt rise in myocardial septal temperature.  The heart was allowed to passively fill and de-air.  The aortic cross-clamp was then released with a total crossclamp time of 58 minutes.  The patient required electrical defibrillation to return to a sinus rhythm.  She remained hemodynamically stable.  With the patient's body temperature rewarmed to 37  degrees, she was then ventilated and weaned from cardiopulmonary bypass without difficulty.  She remained hemodynamically stable.  She was decannulated in usual fashion.  Protamine sulfate was administered with operative field hemostatic.  Atrial and ventricular pacing wires were applied.  Graft marker was applied.  Left pleural tube and Blake mediastinal drain were left in place.  Pericardium was loosely reapproximated.  Sternum was closed with #6 stainless steel wire. Fascia was closed with interrupted 0 Vicryl, running 3-0 Vicryl in subcutaneous tissue, 4-0 subcuticular stitch in skin edges.  Dry dressings were applied.  Sponge and needle count was reported as correct at the completion of procedure.  The patient tolerated the procedure without obvious complication and was transferred to Surgical Intensive Care Unit for further postop care.  Total pump time was 84 minutes.  The patient did not require any blood bank blood products during the operative procedure.  The sponge count was correct at the completion of procedure.  One single 8-0 needle was missing, but no x-ray will be obtained in the surgical intensive care unit as this needle is too small to be apparent on x-ray.     Lanelle Bal, MD     EG/MEDQ  D:  10/24/2017  T:  10/24/2017  Job:  [id number]  cc:   Devona Konig

## 2017-10-24 NOTE — Progress Notes (Signed)
Patient ID: Kayla Malone, female   DOB: 08/06/1961, 56 y.o.   MRN: [id number]  TCTS Evening Rounds:  Hemodynamically stable in sinus rhythm 78.  sats 99%  Urine output ok.  BMET    Component Value Date/Time   NA 136 10/24/2017 1659   NA 140 06/10/2012 0554   K 3.8 10/24/2017 1659   K 3.6 10/17/2012 1106   CL 96 (L) 10/24/2017 1659   CL 101 06/10/2012 0554   CO2 25 10/24/2017 0301   CO2 29 06/10/2012 0554   GLUCOSE 151 (H) 10/24/2017 1659   GLUCOSE 159 (H) 06/10/2012 0554   BUN 6 10/24/2017 1659   BUN 9 06/10/2012 0554   CREATININE 0.76 10/24/2017 1732   CREATININE 1.35 (H) 06/10/2012 0554   CALCIUM 8.4 (L) 10/24/2017 0301   CALCIUM 8.8 06/10/2012 0554   GFRNONAA >60 10/24/2017 1732   GFRNONAA 45 (L) 06/10/2012 0554   GFRAA >60 10/24/2017 1732   GFRAA 53 (L) 06/10/2012 0554   CBC    Component Value Date/Time   WBC 11.3 (H) 10/24/2017 1732   RBC 3.70 (L) 10/24/2017 1732   HGB 9.9 (L) 10/24/2017 1732   HGB 12.2 10/17/2012 1106   HCT 30.3 (L) 10/24/2017 1732   HCT 38.5 06/10/2012 0554   PLT 189 10/24/2017 1732   PLT 307 06/10/2012 0554   MCV 81.9 10/24/2017 1732   MCV 81 06/10/2012 0554   MCH 26.8 10/24/2017 1732   MCHC 32.7 10/24/2017 1732   RDW 13.1 10/24/2017 1732   RDW 14.0 06/10/2012 0554   LYMPHSABS 2.1 06/10/2012 0554   MONOABS 0.4 06/10/2012 0554   EOSABS 0.1 06/10/2012 0554   BASOSABS 0.0 06/10/2012 0554

## 2017-10-24 NOTE — Progress Notes (Signed)
Patient ID: Kayla Malone, female   DOB: 10/25/1961, 56 y.o.   MRN: [id number] TCTS DAILY ICU PROGRESS NOTE                   [address]            [place] [address]          [phone]   1 Day Post-Op Procedure(s) (LRB): CORONARY ARTERY BYPASS GRAFTING (CABG) x two  , using left internal mammary artery and right leg greater saphenous vein harvested  endoscopically - LIMA to LAD, SVG to RCA (N/A) TRANSESOPHAGEAL ECHOCARDIOGRAM (TEE) (N/A)  Total Length of Stay:  LOS: 4 days   Subjective: Patient awake alert neurologically intact,   Objective: Vital signs in last 24 hours: Temp:  [96.3 F (35.7 C)-97.7 F (36.5 C)] 97.7 F (36.5 C) (11/13 0700) Pulse Rate:  [74-87] 80 (11/13 0700) Cardiac Rhythm: Normal sinus rhythm (11/13 0000) Resp:  [10-23] 17 (11/13 0700) BP: (87-158)/(62-91) 100/66 (11/13 0700) SpO2:  [99 %-100 %] 100 % (11/13 0700) Arterial Line BP: (99-144)/(61-90) 122/61 (11/13 0700) FiO2 (%):  [40 %-50 %] 40 % (11/12 1645) Weight:  [160 lb 11.5 oz (72.9 kg)] 160 lb 11.5 oz (72.9 kg) (11/13 0500)  Filed Weights   10/22/17 0355 10/23/17 0553 10/24/17 0500  Weight: 156 lb 4.8 oz (70.9 kg) 153 lb 9.6 oz (69.7 kg) 160 lb 11.5 oz (72.9 kg)    Weight change: 7 lb 1.9 oz (3.228 kg)   Hemodynamic parameters for last 24 hours: PAP: (10-34)/(1-22) 26/13 CO:  [1.8 L/min-4.6 L/min] 3.7 L/min CI:  [1 L/min/m2-2.7 L/min/m2] 2.1 L/min/m2  Intake/Output from previous day: 11/12 0701 - 11/13 0700 In: 4851.9 [I.V.:3331.9; Blood:300; IV Piggyback:1220] Out: 2774 [Urine:3355; Emesis/NG output:200; Blood:400; Chest Tube:290]  Intake/Output this shift: No intake/output data recorded.  Current Meds: Scheduled Meds: . acetaminophen  1,000 mg Oral Q6H   Or  . acetaminophen (TYLENOL) oral liquid 160 mg/5 mL  1,000 mg Per Tube Q6H  . aspirin EC  325 mg Oral Daily   Or  . aspirin  324 mg Per Tube Daily  . atorvastatin  40 mg Oral q1800  . bisacodyl   10 mg Oral Daily   Or  . bisacodyl  10 mg Rectal Daily  . Chlorhexidine Gluconate Cloth  6 each Topical Daily  . docusate sodium  200 mg Oral Daily  . fluticasone  2 spray Each Nare Daily  . insulin regular  0-10 Units Intravenous TID WC  . mouth rinse  15 mL Mouth Rinse BID  . metoprolol tartrate  12.5 mg Oral BID   Or  . metoprolol tartrate  12.5 mg Per Tube BID  . mometasone-formoterol  2 puff Inhalation BID  . [START ON 10/25/2017] pantoprazole  40 mg Oral Daily  . sodium chloride flush  10-40 mL Intracatheter Q12H  . sodium chloride flush  3 mL Intravenous Q12H  . traZODone  50 mg Oral QHS   Continuous Infusions: . sodium chloride 20 mL/hr at 10/24/17 0700  . sodium chloride    . sodium chloride Stopped (10/23/17 2300)  . albumin human Stopped (10/23/17 1600)  . cefUROXime (ZINACEF)  IV Stopped (10/23/17 2030)  . dexmedetomidine (PRECEDEX) IV infusion Stopped (10/23/17 1450)  . famotidine (PEPCID) IV Stopped (10/23/17 1337)  . insulin (NOVOLIN-R) infusion 0.7 Units/hr (10/24/17 0700)  . lactated ringers    . lactated ringers Stopped (10/23/17 2300)  . lactated ringers 20 mL/hr at 10/24/17  0700  . nitroGLYCERIN 10 mcg/min (10/24/17 0700)  . phenylephrine (NEO-SYNEPHRINE) Adult infusion Stopped (10/23/17 1500)   PRN Meds:.sodium chloride, albumin human, lactated ringers, metoprolol tartrate, midazolam, morphine injection, ondansetron (ZOFRAN) IV, oxyCODONE, sodium chloride flush, sodium chloride flush  General appearance: alert and cooperative Neurologic: intact Heart: regular rate and rhythm, S1, S2 normal, no murmur, click, rub or gallop Lungs: clear to auscultation bilaterally Abdomen: soft, non-tender; bowel sounds normal; no masses,  no organomegaly Extremities: extremities normal, atraumatic, no cyanosis or edema Wound: Sternum stable  Lab Results: CBC: Recent Labs    10/23/17 1845 10/24/17 0301  WBC 15.1* 10.7*  HGB 10.6* 10.3*  HCT 31.3* 31.1*  PLT 178  194   BMET:  Recent Labs    10/23/17 1325 10/23/17 1807 10/23/17 1845 10/24/17 0301  NA 141 143  --  136  K 3.3* 4.1  --  3.9  CL 111 105  --  105  CO2 25  --   --  25  GLUCOSE 115* 126*  --  107*  BUN <5* 5*  --  6  CREATININE 0.98 0.70 0.78 0.70  CALCIUM 7.9*  --   --  8.4*    CMET: Lab Results  Component Value Date   WBC 10.7 (H) 10/24/2017   HGB 10.3 (L) 10/24/2017   HCT 31.1 (L) 10/24/2017   PLT 194 10/24/2017   GLUCOSE 107 (H) 10/24/2017   ALT 12 (L) 10/23/2017   AST 27 10/23/2017   NA 136 10/24/2017   K 3.9 10/24/2017   CL 105 10/24/2017   CREATININE 0.70 10/24/2017   BUN 6 10/24/2017   CO2 25 10/24/2017   INR 1.35 10/23/2017   HGBA1C 7.3 (H) 10/23/2017      PT/INR:  Recent Labs    10/23/17 1325  LABPROT 16.5*  INR 1.35   Radiology: Dg Chest Port 1 View  Result Date: 10/23/2017 CLINICAL DATA:  Status post CABG today. EXAM: PORTABLE CHEST 1 VIEW COMPARISON:  Preoperative study of October 20, 2017 FINDINGS: The lungs are well-expanded and clear. The heart and pulmonary vascularity are normal. There is calcification in the wall of the aortic arch. The Swan-Ganz catheter tip projects in the proximal right main pulmonary artery. The endotracheal tube tip lies 4 point 4 cm above the carina. The esophagogastric tube proximal port lies in the distal esophagus and the tip in the gastric cardia. The mediastinal drain and the left chest tube are in reasonable position at terminating over the T5 vertebral body and the posterolateral aspect of the left fourth rib respectively. IMPRESSION: The support tubes are in position as described. Advancement of the esophagogastric tube by 10 cm is recommended to assure that the proximal port is positioned below the GE junction. No CHF, pneumothorax, or pleural effusion. Electronically Signed   By: [name]  Martinique M.D.   On: 10/23/2017 13:34     Assessment/Plan: S/P Procedure(s) (LRB): CORONARY ARTERY BYPASS GRAFTING (CABG) x two   , using left internal mammary artery and right leg greater saphenous vein harvested  endoscopically - LIMA to LAD, SVG to RCA (N/A) TRANSESOPHAGEAL ECHOCARDIOGRAM (TEE) (N/A) Mobilize Diuresis Diabetes control d/c tubes/lines See progression orders Expected Acute  Blood - loss Anemia- continue to monitor  Patient stable early postoperative, will have physical therapy see with her preop history of stroke and residual left leg weakness    Grace Isaac 10/24/2017 7:31 AM

## 2017-10-25 ENCOUNTER — Inpatient Hospital Stay (HOSPITAL_COMMUNITY): Payer: Medicare Other

## 2017-10-25 ENCOUNTER — Encounter (HOSPITAL_COMMUNITY): Payer: Self-pay | Admitting: General Practice

## 2017-10-25 LAB — CBC
HCT: 28.4 % — ABNORMAL LOW (ref 36.0–46.0)
Hemoglobin: 9.2 g/dL — ABNORMAL LOW (ref 12.0–15.0)
MCH: 26.7 pg (ref 26.0–34.0)
MCHC: 32.4 g/dL (ref 30.0–36.0)
MCV: 82.3 fL (ref 78.0–100.0)
Platelets: 202 10*3/uL (ref 150–400)
RBC: 3.45 MIL/uL — ABNORMAL LOW (ref 3.87–5.11)
RDW: 13.2 % (ref 11.5–15.5)
WBC: 10 10*3/uL (ref 4.0–10.5)

## 2017-10-25 LAB — GLUCOSE, CAPILLARY
GLUCOSE-CAPILLARY: 81 mg/dL (ref 65–99)
GLUCOSE-CAPILLARY: 137 mg/dL — AB (ref 65–99)
Glucose-Capillary: 93 mg/dL (ref 65–99)
Glucose-Capillary: 138 mg/dL — ABNORMAL HIGH (ref 65–99)
Glucose-Capillary: 99 mg/dL (ref 65–99)

## 2017-10-25 LAB — BASIC METABOLIC PANEL
Anion gap: 7 (ref 5–15)
BUN: 6 mg/dL (ref 6–20)
CO2: 26 mmol/L (ref 22–32)
Calcium: 8.5 mg/dL — ABNORMAL LOW (ref 8.9–10.3)
Chloride: 102 mmol/L (ref 101–111)
Creatinine, Ser: 0.84 mg/dL (ref 0.44–1.00)
GFR calc Af Amer: >60 mL/min (ref >60)
GFR calc non Af Amer: >60 mL/min (ref >60)
Glucose, Bld: 102 mg/dL — ABNORMAL HIGH (ref 65–99)
Potassium: 3.7 mmol/L (ref 3.5–5.1)
Sodium: 135 mmol/L (ref 135–145)

## 2017-10-25 MED ORDER — SODIUM CHLORIDE 0.9% FLUSH
3.0000 mL | Freq: Two times a day (BID) | INTRAVENOUS | Status: DC
  Administered 2017-10-25 – 2017-10-28 (×7): 3 mL via INTRAVENOUS

## 2017-10-25 MED ORDER — MOVING RIGHT ALONG BOOK
Freq: Once | Status: AC
  Administered 2017-10-25: 09:00:00
  Filled 2017-10-25: qty 1

## 2017-10-25 MED ORDER — ACETAMINOPHEN 325 MG PO TABS
650.0000 mg | ORAL_TABLET | Freq: Four times a day (QID) | ORAL | Status: DC | PRN
  Administered 2017-10-25 – 2017-10-26 (×4): 650 mg via ORAL
  Filled 2017-10-25 (×4): qty 2

## 2017-10-25 MED ORDER — ONDANSETRON HCL 4 MG/2ML IJ SOLN
4.0000 mg | Freq: Four times a day (QID) | INTRAMUSCULAR | Status: DC | PRN
  Administered 2017-10-25: 4 mg via INTRAVENOUS
  Filled 2017-10-25: qty 2

## 2017-10-25 MED ORDER — METOPROLOL TARTRATE 12.5 MG HALF TABLET
12.5000 mg | ORAL_TABLET | Freq: Two times a day (BID) | ORAL | Status: DC
  Administered 2017-10-25 – 2017-10-26 (×3): 12.5 mg via ORAL
  Filled 2017-10-25 (×4): qty 1

## 2017-10-25 MED ORDER — ASPIRIN EC 81 MG PO TBEC
81.0000 mg | DELAYED_RELEASE_TABLET | Freq: Every day | ORAL | Status: DC
  Administered 2017-10-25 – 2017-10-28 (×4): 81 mg via ORAL
  Filled 2017-10-25 (×4): qty 1

## 2017-10-25 MED ORDER — BISACODYL 10 MG RE SUPP: 10.0000 mg | Freq: Every day | RECTAL | Status: DC | PRN

## 2017-10-25 MED ORDER — SODIUM CHLORIDE 0.9% FLUSH: 3.0000 mL | INTRAVENOUS | Status: DC | PRN

## 2017-10-25 MED ORDER — ONDANSETRON HCL 4 MG PO TABS: 4.0000 mg | ORAL_TABLET | Freq: Four times a day (QID) | ORAL | Status: DC | PRN

## 2017-10-25 MED ORDER — INSULIN ASPART 100 UNIT/ML ~~LOC~~ SOLN
0.0000 [IU] | Freq: Three times a day (TID) | SUBCUTANEOUS | Status: DC
  Administered 2017-10-26: 2 [IU] via SUBCUTANEOUS
  Administered 2017-10-27: 4 [IU] via SUBCUTANEOUS

## 2017-10-25 MED ORDER — BISACODYL 5 MG PO TBEC: 10.0000 mg | DELAYED_RELEASE_TABLET | Freq: Every day | ORAL | Status: DC | PRN

## 2017-10-25 MED ORDER — SODIUM CHLORIDE 0.9 % IV SOLN: 250.0000 mL | INTRAVENOUS | Status: DC | PRN

## 2017-10-25 MED ORDER — OXYCODONE HCL 5 MG PO TABS
5.0000 mg | ORAL_TABLET | ORAL | Status: DC | PRN
  Administered 2017-10-25 – 2017-10-28 (×10): 5 mg via ORAL
  Filled 2017-10-25 (×11): qty 1

## 2017-10-25 MED FILL — Heparin Sodium (Porcine) Inj 1000 Unit/ML: INTRAMUSCULAR | Qty: 30 | Status: AC

## 2017-10-25 MED FILL — Magnesium Sulfate Inj 50%: INTRAMUSCULAR | Qty: 10 | Status: AC

## 2017-10-25 MED FILL — Potassium Chloride Inj 2 mEq/ML: INTRAVENOUS | Qty: 20 | Status: AC

## 2017-10-25 NOTE — Progress Notes (Signed)
D/C foley and line. Per MD order. tranfer to chair. Report called to 4E nurse. patient transfer to 4E per Swot Nurse. Pt. metoprolol was held due to low b/p. Concerns conferred to nurse now taking care of patient.

## 2017-10-25 NOTE — Progress Notes (Signed)
Patient ID: Kayla Malone, female   DOB: 05/19/1961, 56 y.o.   MRN: [id number] TCTS DAILY ICU PROGRESS NOTE                   [address]            [place] [address]          [phone]   2 Days Post-Op Procedure(s) (LRB): CORONARY ARTERY BYPASS GRAFTING (CABG) x two  , using left internal mammary artery and right leg greater saphenous vein harvested  endoscopically - LIMA to LAD, SVG to RCA (N/A) TRANSESOPHAGEAL ECHOCARDIOGRAM (TEE) (N/A)  Total Length of Stay:  LOS: 5 days   Subjective: Patient awake alert, notes the nausea has improved, still complains of feeling hot  Objective: Vital signs in last 24 hours: Temp:  [97.4 F (36.3 C)-99 F (37.2 C)] 98.3 F (36.8 C) (11/14 0400) Pulse Rate:  [69-91] 90 (11/14 0700) Cardiac Rhythm: Normal sinus rhythm (11/14 0400) Resp:  [13-30] 16 (11/14 0700) BP: (73-117)/(45-76) 89/60 (11/14 0700) SpO2:  [92 %-100 %] 97 % (11/14 0700) Arterial Line BP: (126-132)/(54-69) 126/54 (11/13 1100)  Filed Weights   10/22/17 0355 10/23/17 0553 10/24/17 0500  Weight: 156 lb 4.8 oz (70.9 kg) 153 lb 9.6 oz (69.7 kg) 160 lb 11.5 oz (72.9 kg)    Weight change:    Hemodynamic parameters for last 24 hours:    Intake/Output from previous day: 11/13 0701 - 11/14 0700 In: 745.2 [P.O.:480; I.V.:165.2; IV Piggyback:100] Out: 1320 [Urine:1300; Chest Tube:20]  Intake/Output this shift: No intake/output data recorded.  Current Meds: Scheduled Meds: . acetaminophen  1,000 mg Oral Q6H   Or  . acetaminophen (TYLENOL) oral liquid 160 mg/5 mL  1,000 mg Per Tube Q6H  . aspirin EC  325 mg Oral Daily   Or  . aspirin  324 mg Per Tube Daily  . atorvastatin  40 mg Oral q1800  . bisacodyl  10 mg Oral Daily   Or  . bisacodyl  10 mg Rectal Daily  . Chlorhexidine Gluconate Cloth  6 each Topical Daily  . docusate sodium  200 mg Oral Daily  . enoxaparin (LOVENOX) injection  30 mg Subcutaneous QHS  . fluticasone  2 spray Each Nare  Daily  . insulin aspart  0-24 Units Subcutaneous Q4H  . insulin detemir  15 Units Subcutaneous Daily  . insulin regular  0-10 Units Intravenous TID WC  . mouth rinse  15 mL Mouth Rinse BID  . metoprolol tartrate  12.5 mg Oral BID   Or  . metoprolol tartrate  12.5 mg Per Tube BID  . mometasone-formoterol  2 puff Inhalation BID  . pantoprazole  40 mg Oral Daily  . sodium chloride flush  10-40 mL Intracatheter Q12H  . sodium chloride flush  3 mL Intravenous Q12H  . traZODone  50 mg Oral QHS   Continuous Infusions: . sodium chloride 20 mL/hr at 10/24/17 0700  . sodium chloride    . sodium chloride Stopped (10/24/17 1700)  . cefUROXime (ZINACEF)  IV Stopped (10/24/17 2024)  . dexmedetomidine (PRECEDEX) IV infusion Stopped (10/23/17 1450)  . insulin (NOVOLIN-R) infusion Stopped (10/24/17 1205)  . lactated ringers Stopped (10/23/17 2300)  . lactated ringers Stopped (10/24/17 0900)  . nitroGLYCERIN Stopped (10/24/17 1000)  . phenylephrine (NEO-SYNEPHRINE) Adult infusion Stopped (10/23/17 1500)   PRN Meds:.sodium chloride, calcium carbonate, metoprolol tartrate, midazolam, morphine injection, ondansetron (ZOFRAN) IV, oxyCODONE, sodium chloride flush, sodium chloride flush  General appearance:  alert and cooperative Neurologic: intact Heart: regular rate and rhythm, S1, S2 normal, no murmur, click, rub or gallop Lungs: diminished breath sounds bibasilar Abdomen: soft, non-tender; bowel sounds normal; no masses,  no organomegaly Extremities: extremities normal, atraumatic, no cyanosis or edema and Homans sign is negative, no sign of DVT Wound: Sternum intact  Lab Results: CBC: Recent Labs    10/24/17 1732 10/25/17 0253  WBC 11.3* 10.0  HGB 9.9* 9.2*  HCT 30.3* 28.4*  PLT 189 202   BMET:  Recent Labs    10/24/17 0301 10/24/17 1659 10/24/17 1732 10/25/17 0253  NA 136 136  --  135  K 3.9 3.8  --  3.7  CL 105 96*  --  102  CO2 25  --   --  26  GLUCOSE 107* 151*  --  102*    BUN 6 6  --  6  CREATININE 0.70 0.70 0.76 0.84  CALCIUM 8.4*  --   --  8.5*    CMET: Lab Results  Component Value Date   WBC 10.0 10/25/2017   HGB 9.2 (L) 10/25/2017   HCT 28.4 (L) 10/25/2017   PLT 202 10/25/2017   GLUCOSE 102 (H) 10/25/2017   ALT 12 (L) 10/23/2017   AST 27 10/23/2017   NA 135 10/25/2017   K 3.7 10/25/2017   CL 102 10/25/2017   CREATININE 0.84 10/25/2017   BUN 6 10/25/2017   CO2 26 10/25/2017   INR 1.35 10/23/2017   HGBA1C 7.3 (H) 10/23/2017      PT/INR:  Recent Labs    10/23/17 1325  LABPROT 16.5*  INR 1.35   Radiology: Dg Chest Port 1 View  Result Date: 10/25/2017 CLINICAL DATA:  Chest pain EXAM: PORTABLE CHEST 1 VIEW COMPARISON:  October 24, 2017 FINDINGS: Swan-Ganz catheter has been removed. Cordis tip is in the superior vena cava. There has been interval removal of left chest tube and mediastinal drain. There is no apparent pneumothorax. Pacemaker wires remain attached to the right heart. There is airspace consolidation in the left lower lobe with left pleural effusion. There is a right pleural effusion, wearing, with minimal right base atelectasis. Heart is mildly enlarged with mild pulmonary venous hypertension. No adenopathy. No bone lesions. There is aortic atherosclerosis. IMPRESSION: No pneumothorax. Persistent left lower lobe consolidation. Bilateral pleural effusions. Minimal right base atelectasis. Underlying pulmonary vascular congestion. There is aortic atherosclerosis. Aortic Atherosclerosis (ICD10-I70.0). Electronically Signed   By: Lowella Grip III M.D.   On: 10/25/2017 07:43     Assessment/Plan: S/P Procedure(s) (LRB): CORONARY ARTERY BYPASS GRAFTING (CABG) x two  , using left internal mammary artery and right leg greater saphenous vein harvested  endoscopically - LIMA to LAD, SVG to RCA (N/A) TRANSESOPHAGEAL ECHOCARDIOGRAM (TEE) (N/A) Mobilize Diuresis Plan for transfer to step-down: see transfer orders With previous history  of stroke physical therapy has begun seeing the patient Expected Acute  Blood - loss Anemia- continue to monitor  Renal function remains stable    Grace Isaac 10/25/2017 8:19 AM

## 2017-10-25 NOTE — Progress Notes (Signed)
CARDIAC REHAB PHASE I   PRE:  Rate/Rhythm: 90 SR    BP: sitting 94/54    SaO2: 89-92 RA  MODE:  Ambulation: 120 ft   POST:  Rate/Rhythm: 112 ST    BP: sitting 127/67     SaO2: 93 RA  Pt asleep on our arrival but awoke and wanted to walk, even though she was groggy. She is moving well, just groggy from pain meds. Fairly steady, does not grip well with left hand from previous CVA. Used RW, assist x1 with gait belt and x1 person following with chair. Return to bed, feeling hot. VSS. Will f/u, hopefully can be x1 if not groggy. [place], ACSM 10/25/2017 2:43 PM

## 2017-10-25 NOTE — Progress Notes (Signed)
10/25/2017 1020 Received pt to room 4E-08 from [facility].  Pt is A&O, is in some pain at this time.  Will medicate soon.  Tele monitor applied and CCMD notified.  Oriented pt and family to room, call light and bed.  Cal bell in reach and family at bedside. [place]

## 2017-10-25 NOTE — Progress Notes (Addendum)
Physical Therapy Treatment Patient Details Name: Kayla Malone MRN: [id number] DOB: 1961-09-02 Today's Date: 10/25/2017    History of Present Illness Pt is a 56 year old female admtited 10/20/17 with chest pain; now s/p CABGx3 on 11/12. Pt with h/o stroke (L-side residual weakness), HTN, CAD, DM, depression, asthma.   PT Comments    Overall, pt moving very well despite significant increase in pain this afternoon. Able to stand and amb to/from bathroom while holding heart pillow to incision site with no AD and min guard for balance. Further mobility declined secondary to pain. SpO2 remained >90% on RA. Husband present throughout session and very supportive. Will continue to follow acutely to maximize functional mobility and independence.    Follow Up Recommendations  No PT follow up;Supervision/Assistance - 24 hour     Equipment Recommendations  Rolling walker with 5" wheels    Recommendations for Other Services       Precautions / Restrictions Precautions Precautions: Sternal;Fall Restrictions Weight Bearing Restrictions: No    Mobility  Bed Mobility Overal bed mobility: Needs Assistance Bed Mobility: Supine to Sit     Supine to sit: Min assist     General bed mobility comments: Educ on log roll technique; minA to assist trunk elevation. Pt hugged heart pillow throughout  Transfers Overall transfer level: Needs assistance Equipment used: None Transfers: Sit to/from Stand Sit to Stand: Min guard         General transfer comment: Able to stand from bed and toilet while hugging heart pillow; good technique pushing up with BLEs. Indep for pericare  Ambulation/Gait Ambulation/Gait assistance: Min guard Ambulation Distance (Feet): 20 Feet Assistive device: None Gait Pattern/deviations: Step-through pattern;Decreased stride length Gait velocity: Decreased Gait velocity interpretation: <1.8 ft/sec, indicative of risk for recurrent falls General Gait Details:  Amb to/from bathroom while holding pillow secondary to increased pain; no AD. Min guard for balance   Stairs            Wheelchair Mobility    Modified Rankin (Stroke Patients Only)       Balance Overall balance assessment: Needs assistance Sitting-balance support: Feet supported;No upper extremity supported Sitting balance-Leahy Scale: Good     Standing balance support: No upper extremity supported Standing balance-Leahy Scale: Fair Standing balance comment: Able to amb with no UE support                            Cognition Arousal/Alertness: Awake/alert Behavior During Therapy: WFL for tasks assessed/performed;Flat affect Overall Cognitive Status: Within Functional Limits for tasks assessed                                 General Comments: Pt with flat affect, most likely secondary to significant increased pain this session      Exercises      General Comments        Pertinent Vitals/Pain Pain Assessment: Faces Faces Pain Scale: Hurts worst Pain Location: Sternal incision Pain Descriptors / Indicators: Sore;Discomfort;Operative site guarding Pain Intervention(s): Patient requesting pain meds-RN notified;Limited activity within patient's tolerance    Home Living                      Prior Function            PT Goals (current goals can now be found in the care plan section) Acute Rehab PT Goals Patient Stated Goal:  Decreased PT Goal Formulation: With patient Time For Goal Achievement: 10/31/17 Potential to Achieve Goals: Good Progress towards PT goals: Progressing toward goals    Frequency    Min 3X/week      PT Plan Current plan remains appropriate    Co-evaluation              AM-PAC PT "6 Clicks" Daily Activity  Outcome Measure  Difficulty turning over in bed (including adjusting bedclothes, sheets and blankets)?: None Difficulty moving from lying on back to sitting on the side of the bed? :  Unable Difficulty sitting down on and standing up from a chair with arms (e.g., wheelchair, bedside commode, etc,.)?: A Little Help needed moving to and from a bed to chair (including a wheelchair)?: A Little Help needed walking in hospital room?: A Little Help needed climbing 3-5 steps with a railing? : A Lot 6 Click Score: 16    End of Session Equipment Utilized During Treatment: Gait belt Activity Tolerance: Patient limited by pain Patient left: in bed;with call bell/phone within reach;with family/visitor present;with nursing/sitter in room Nurse Communication: Mobility status PT Visit Diagnosis: Unsteadiness on feet (R26.81)     Time: 6314-9702 PT Time Calculation (min) (ACUTE ONLY): 14 min  Charges:  $Therapeutic Activity: 8-22 mins                    G Codes:      Mabeline Caras, PT, DPT Acute Rehab Services  Pager: [place] 10/25/2017, 12:15 PM

## 2017-10-26 ENCOUNTER — Inpatient Hospital Stay (HOSPITAL_COMMUNITY): Payer: Medicare Other

## 2017-10-26 LAB — BASIC METABOLIC PANEL
Anion gap: 7 (ref 5–15)
BUN: 8 mg/dL (ref 6–20)
CO2: 28 mmol/L (ref 22–32)
Calcium: 8.6 mg/dL — ABNORMAL LOW (ref 8.9–10.3)
Chloride: 103 mmol/L (ref 101–111)
Creatinine, Ser: 0.87 mg/dL (ref 0.44–1.00)
GFR calc Af Amer: >60 mL/min (ref >60)
GFR calc non Af Amer: >60 mL/min (ref >60)
Glucose, Bld: 155 mg/dL — ABNORMAL HIGH (ref 65–99)
Potassium: 3.5 mmol/L (ref 3.5–5.1)
Sodium: 138 mmol/L (ref 135–145)

## 2017-10-26 LAB — CBC
HCT: 29.6 % — ABNORMAL LOW (ref 36.0–46.0)
Hemoglobin: 9.5 g/dL — ABNORMAL LOW (ref 12.0–15.0)
MCH: 26.5 pg (ref 26.0–34.0)
MCHC: 32.1 g/dL (ref 30.0–36.0)
MCV: 82.7 fL (ref 78.0–100.0)
Platelets: 243 10*3/uL (ref 150–400)
RBC: 3.58 MIL/uL — ABNORMAL LOW (ref 3.87–5.11)
RDW: 13.1 % (ref 11.5–15.5)
WBC: 10.2 10*3/uL (ref 4.0–10.5)

## 2017-10-26 LAB — GLUCOSE, CAPILLARY
GLUCOSE-CAPILLARY: 86 mg/dL (ref 65–99)
GLUCOSE-CAPILLARY: 114 mg/dL — AB (ref 65–99)
GLUCOSE-CAPILLARY: 132 mg/dL — AB (ref 65–99)

## 2017-10-26 MED ORDER — METFORMIN HCL 500 MG PO TABS
500.0000 mg | ORAL_TABLET | Freq: Two times a day (BID) | ORAL | Status: DC
  Administered 2017-10-26 – 2017-10-28 (×5): 500 mg via ORAL
  Filled 2017-10-26 (×5): qty 1

## 2017-10-26 MED ORDER — LACTULOSE 10 GM/15ML PO SOLN
20.0000 g | Freq: Once | ORAL | Status: AC
  Administered 2017-10-26: 20 g via ORAL
  Filled 2017-10-26: qty 30

## 2017-10-26 MED ORDER — POTASSIUM CHLORIDE CRYS ER 20 MEQ PO TBCR
20.0000 meq | EXTENDED_RELEASE_TABLET | Freq: Every day | ORAL | Status: DC
  Administered 2017-10-26 – 2017-10-28 (×3): 20 meq via ORAL
  Filled 2017-10-26 (×3): qty 1

## 2017-10-26 MED ORDER — FUROSEMIDE 40 MG PO TABS
40.0000 mg | ORAL_TABLET | Freq: Every day | ORAL | Status: DC
  Administered 2017-10-26 – 2017-10-28 (×3): 40 mg via ORAL
  Filled 2017-10-26 (×3): qty 1

## 2017-10-26 MED FILL — Lidocaine HCl IV Inj 20 MG/ML: INTRAVENOUS | Qty: 5 | Status: AC

## 2017-10-26 MED FILL — Sodium Chloride IV Soln 0.9%: INTRAVENOUS | Qty: 2000 | Status: AC

## 2017-10-26 MED FILL — Electrolyte-R (PH 7.4) Solution: INTRAVENOUS | Qty: 3000 | Status: AC

## 2017-10-26 MED FILL — Sodium Bicarbonate IV Soln 8.4%: INTRAVENOUS | Qty: 50 | Status: AC

## 2017-10-26 MED FILL — Mannitol IV Soln 20%: INTRAVENOUS | Qty: 500 | Status: AC

## 2017-10-26 NOTE — Discharge Summary (Signed)
Physician Discharge Summary       [address]       ,[place] [address]             [phone]    Patient ID: Kayla Malone MRN: [id number] DOB/AGE: Jan 10, 1961 56 y.o.  Admit date: 10/20/2017 Discharge date: 10/28/2017  Admission Diagnoses: 1. Unstable angina ([place]) 2. Coronary artery disease  Active Diagnoses:  1. Diabetes mellitus without complication ([place]) 2. Hypertension 3. GERD (gastroesophageal reflux disease) 4. Depression 5. Stroke ([place]) 6. Asthma 7. Thyroid cyst 8. History of hiatal hernia 9. ABL anemia   Procedure (s):  Coronary artery bypass grafting x2 with the left internal mammary to the left anterior descending coronary artery and reversed saphenous vein graft to the posterior descending coronary artery with right thigh greater saphenous endo vein harvesting.    History of Presenting Illness: This is a 56 y.o. African American female is seen in the office for evaluation of chest pain and review of recent cardiac catheterization to make recommendations concerning possible coronary artery bypass grafting.  Patient was seen in June 2018 after [facility] Emergency Room for chest pain.  She was given steroids but no definitive follow-up with cardiology.  She was recently seen by Dr. Chancy Milroy. She was noted to have an abnormal EKG and subsequently underwent cardiac stress testing and echocardiogram with positive results and was referred for cardiac catheterization.  Patient has known long-standing diabetes.  Previous stroke in Oct 2016 with left-sided weakness and speech difficulty   Patient continues to have episodes of dizziness, and chest tightness.  She went to have her echo done on 10/20/2017 at [facility] and experienced chest tightness/substernal chest pain. She was previously scheduled to have coronary bypass grafting surgery on 10/23/2017;however, she was sent to ED for admission by cardiology as symptomatic. She was placed on  IV Nitro and Heparin. She was then chest pain free.  Her pre operative testing was then finished in the hospital. Her echo showed LVEF 60-65%, trivial MR, but no other valvular abnormalities. Pre operative carotid duplex showed no significant internal carotid artery stenosis bilaterally. Potential risks, benefits, and complications of the surgery were discussed with the patient again and she agreed to proceed with surgery. She underwent a CABG x 2 by Dr. Servando Snare on 10/23/2017.    Brief Hospital Course: The patient was extubated the evening of surgery without difficulty. He/she remained afebrile and hemodynamically stable. She was weaned off of Neo Synephrine and Nitroglycerin drips. Gordy Councilman, a line, chest tubes, and foley were removed early in the post operative course. Lopressor was started and titrated accordingly. She was volume over loaded and diuresed. She had ABL anemia. She did not require a post op transfusion. Last H and H was 9.5 and 29.6. She was weaned off the insulin drip.  Once she was tolerating a diet, Metformin was restarted. The patient's glucose remained well controlled. The patient's HGA1C pre op was 7.3. The patient was felt surgically stable for transfer from the ICU to PCTU for further convalescence on 10/25/2017. She continues to progress with cardiac rehab. She was ambulating on room air. She has been tolerating a diet and has had a bowel movement. Epicardial pacing wires were removed on 10/26/2017. Chest tube sutures will be removed the day of discharge. The patient is felt surgically stable for discharge today.   Latest Vital Signs: Blood pressure 127/84, pulse (!) 106, temperature 98.3 F (36.8 C), temperature source Oral, resp. rate (!) 21, height  5\' 3"  (1.6 m), weight 153 lb 14.4 oz (69.8 kg), SpO2 97 %.  Physical Exam: Cardiovascular: RRR Pulmonary: Mostly clear to auscultation bilaterally Abdomen: Soft, non tender, bowel sounds present. Extremities: Mild bilateral  lower extremity edema. Wounds: Sternal wound is clean and dry.  No erythema or signs of infection. RLE wound is clean and dry as well.  Discharge Condition:Stable and discharged to home.  Recent laboratory studies:  Lab Results  Component Value Date   WBC 10.2 10/26/2017   HGB 9.5 (L) 10/26/2017   HCT 29.6 (L) 10/26/2017   MCV 82.7 10/26/2017   PLT 243 10/26/2017   Lab Results  Component Value Date   NA 138 10/26/2017   K 3.5 10/26/2017   CL 103 10/26/2017   CO2 28 10/26/2017   CREATININE 0.87 10/26/2017   GLUCOSE 155 (H) 10/26/2017    Diagnostic Studies: Dg Chest 2 View  Result Date: 10/26/2017 CLINICAL DATA:  Postop EXAM: CHEST  2 VIEW COMPARISON:  Yesterday FINDINGS: Right IJ sheath has been removed. Lung volumes are improving. Small pleural effusions and atelectasis. No pneumothorax. Stable heart size after CABG. IMPRESSION: Improving lung volumes with decreasing atelectasis. Small pleural effusions. No pneumothorax. Electronically Signed   By: Monte Fantasia M.D.   On: 10/26/2017 07:42   Discharge Instructions    Discharge patient   Complete by:  As directed    Home health and home PT   Discharge disposition:  01-Home or Self Care   Discharge patient date:  10/28/2017    Discharge Medications: Allergies as of 10/28/2017      Reactions   Lisinopril    Tramadol Other (See Comments)   sleepiness      Medication List    STOP taking these medications   ibuprofen 600 MG tablet Commonly known as:  ADVIL,MOTRIN   isosorbide dinitrate 30 MG tablet Commonly known as:  ISORDIL   losartan 25 MG tablet Commonly known as:  COZAAR   metoprolol succinate 50 MG 24 hr tablet Commonly known as:  TOPROL-XL   nabumetone 750 MG tablet Commonly known as:  RELAFEN   nitroGLYCERIN 0.4 MG SL tablet Commonly known as:  NITROSTAT     TAKE these medications   acetaminophen 500 MG tablet Commonly known as:  TYLENOL Take 1,000 mg by mouth every 6 (six) hours as needed for  mild pain or headache.   ADVAIR DISKUS 500-50 MCG/DOSE Aepb Generic drug:  Fluticasone-Salmeterol Inhale 1 puff into the lungs daily as needed (SOB).   albuterol 108 (90 Base) MCG/ACT inhaler Commonly known as:  PROVENTIL HFA;VENTOLIN HFA Inhale 2 puffs into the lungs every 6 (six) hours as needed for wheezing or shortness of breath.   aspirin EC 81 MG tablet Take 81 mg by mouth daily.   atorvastatin 40 MG tablet Commonly known as:  LIPITOR Take 40 mg by mouth daily.   buPROPion 150 MG 12 hr tablet Commonly known as:  ZYBAN Take 150 mg by mouth 2 (two) times daily.   fluticasone 50 MCG/ACT nasal spray Commonly known as:  FLONASE Place 2 sprays into both nostrils daily.   metFORMIN 500 MG tablet Commonly known as:  GLUCOPHAGE Take 500 mg by mouth 3 (three) times daily.   metoprolol tartrate 25 MG tablet Commonly known as:  LOPRESSOR Take 1 tablet (25 mg total) 2 (two) times daily by mouth.   oxyCODONE 5 MG immediate release tablet Commonly known as:  Oxy IR/ROXICODONE Take 1 tablet (5 mg total) every 4 (four) hours  as needed by mouth for severe pain.   pantoprazole 40 MG tablet Commonly known as:  PROTONIX Take 40 mg by mouth.   sitaGLIPtin 50 MG tablet Commonly known as:  JANUVIA Take 50 mg by mouth daily.   traZODone 50 MG tablet Commonly known as:  DESYREL Take 50 mg by mouth at bedtime.   Vitamin D (Ergocalciferol) 50000 units Caps capsule Commonly known as:  DRISDOL Take 50,000 Units every 7 (seven) days by mouth. tuesday            Durable Medical Equipment  (From admission, onward)        Start     Ordered   10/27/17 1556  For home use only DME Cane  Once     10/27/17 1556     The patient has been discharged on:   1.Beta Blocker:  Yes [ x  ]                              No   [   ]                              If No, reason:  2.Ace Inhibitor/ARB: Yes [   ]                                     No  [  x  ]                                      If No, reason:titration of BB  3.Statin:   Yes [ x  ]                  No  [   ]                  If No, reason:  4.Ecasa:  Yes  [  x ]                  No   [   ]                  If No, reason:  Follow Up Appointments: Follow-up Information    Dionisio [name], MD. Call.   Specialty:  Cardiology Why:  for a follow up appointment for 2 weeks Contact information: [place] [address] [phone]        Grace Isaac, MD. Go on 12/07/2017.   Specialty:  Cardiothoracic Surgery Why:  PA/LAT CXR to be taken (at [place] which is in the same building as Dr. Everrett Coombe office on 12/07/2017 at 12:00 pm;Appointment time is at 12:30 pm  Contact information: [place] [address] [phone]        Yetta Barre, PA-C. Call.   Specialty:  Family Medicine Why:  for a follow up appointment regarding further treatment of diabetes and surveillance of HGA1C 7.3 Contact information: [place] [address] [phone]           Signed: Terance Hart ContePA-C 10/28/2017, 9:40 AM

## 2017-10-26 NOTE — Progress Notes (Signed)
Physical Therapy Treatment Patient Details Name: Kayla Malone MRN: [id number] DOB: 11-Mar-1961 Today's Date: 10/26/2017    History of Present Illness Pt is a 56 year old female admtited 10/20/17 with chest pain; now s/p CABGx3 on 11/12. Pt with h/o stroke (L-side residual weakness), HTN, CAD, DM, depression, asthma.    PT Comments    Pt making good progress.   Follow Up Recommendations  No PT follow up;Supervision/Assistance - 24 hour     Equipment Recommendations  Rolling walker with 5" wheels    Recommendations for Other Services       Precautions / Restrictions Precautions Precautions: Sternal;Fall    Mobility  Bed Mobility Overal bed mobility: Needs Assistance Bed Mobility: Rolling;Sidelying to Sit Rolling: Supervision Sidelying to sit: Min assist       General bed mobility comments: Assist to elevate trunk into sitting  Transfers Overall transfer level: Needs assistance Equipment used: None Transfers: Sit to/from Stand Sit to Stand: Supervision         General transfer comment: Following sternal precautions  Ambulation/Gait Ambulation/Gait assistance: Supervision Ambulation Distance (Feet): 175 Feet Assistive device: Rolling walker (2 wheeled) Gait Pattern/deviations: Step-through pattern;Decreased stride length;Decreased dorsiflexion - left Gait velocity: Decreased Gait velocity interpretation: Below normal speed for age/gender General Gait Details: Steady gait with walker but with LLE fatiguing and pt concentrating on clearing lt foot   Stairs            Wheelchair Mobility    Modified Rankin (Stroke Patients Only)       Balance Overall balance assessment: Needs assistance Sitting-balance support: Feet supported;No upper extremity supported Sitting balance-Leahy Scale: Normal     Standing balance support: No upper extremity supported Standing balance-Leahy Scale: Fair                              Cognition  Arousal/Alertness: Awake/alert Behavior During Therapy: WFL for tasks assessed/performed;Flat affect Overall Cognitive Status: Within Functional Limits for tasks assessed                                        Exercises      General Comments        Pertinent Vitals/Pain Pain Assessment: Faces Faces Pain Scale: Hurts even more Pain Location: Sternal incision Pain Descriptors / Indicators: Sore;Operative site guarding Pain Intervention(s): Limited activity within patient's tolerance;Patient requesting pain meds-RN notified    Home Living                      Prior Function            PT Goals (current goals can now be found in the care plan section) Acute Rehab PT Goals Patient Stated Goal: Decreased Progress towards PT goals: Progressing toward goals    Frequency    Min 3X/week      PT Plan Current plan remains appropriate    Co-evaluation              AM-PAC PT "6 Clicks" Daily Activity  Outcome Measure  Difficulty turning over in bed (including adjusting bedclothes, sheets and blankets)?: A Little Difficulty moving from lying on back to sitting on the side of the bed? : Unable Difficulty sitting down on and standing up from a chair with arms (e.g., wheelchair, bedside commode, etc,.)?: A Little Help needed moving to and from a  bed to chair (including a wheelchair)?: A Little Help needed walking in hospital room?: A Little Help needed climbing 3-5 steps with a railing? : A Lot 6 Click Score: 15    End of Session   Activity Tolerance: Patient tolerated treatment well Patient left: with call bell/phone within reach;in chair Nurse Communication: Mobility status;Patient requests pain meds PT Visit Diagnosis: Unsteadiness on feet (R26.81)     Time: 3664-4034 PT Time Calculation (min) (ACUTE ONLY): 14 min  Charges:  $Gait Training: 8-22 mins                    G Codes:       [facility] PT [place] 10/26/2017, 4:16 PM

## 2017-10-26 NOTE — Care Management Important Message (Signed)
Important Message  Patient Details  Name: Kayla Malone MRN: [id number] Date of Birth: 08/12/1961   Medicare Important Message Given:  Yes    Robi Mitter 10/26/2017, 10:07 AM

## 2017-10-26 NOTE — Progress Notes (Signed)
EPW discontinued per protocol. Pt VSS. No telemetry changes. Pt verbalized understanding of one hour bedrest ending at 12:40.   Midsternal and RLE extremity painted with betadine. Educated patient on need to wash with mild soap and water upon discharge.   Fritz Pickerel, RN

## 2017-10-26 NOTE — Care Management Note (Signed)
Case Management Note Marvetta Gibbons RN, BSN Unit 4E-Case Manager [phone]   Patient Details  Name: Kayla Malone MRN: [id number] Date of Birth: December 09, 1961  Subjective/Objective:    Pt admitted s/p CABGx2                Action/Plan: PTA pt lived at home- pt tx from [facility] on 11/14- CM to follow for d/c needs  Expected Discharge Date:                  Expected Discharge Plan:  Home/Self Care  In-House Referral:     Discharge planning Services  CM Consult  Post Acute Care Choice:    Choice offered to:     DME Arranged:    DME Agency:     HH Arranged:    HH Agency:     Status of Service:  In process, will continue to follow  If discussed at Long Length of Stay Meetings, dates discussed:    Discharge Disposition:   Additional Comments:  Dawayne Patricia, RN 10/26/2017, 11:12 AM

## 2017-10-26 NOTE — Progress Notes (Addendum)
      [address]       [place] [address]             [phone]        3 Days Post-Op Procedure(s) (LRB): CORONARY ARTERY BYPASS GRAFTING (CABG) x two  , using left internal mammary artery and right leg greater saphenous vein harvested  endoscopically - LIMA to LAD, SVG to RCA (N/A) TRANSESOPHAGEAL ECHOCARDIOGRAM (TEE) (N/A)  Subjective: Patient has not had a bowel movement yet. She states lab tried to draw her blood earlier this am but was unsuccessful.  Objective: Vital signs in last 24 hours: Temp:  [98.6 F (37 C)-100.5 F (38.1 C)] 98.6 F (37 C) (11/15 0401) Pulse Rate:  [78-102] 88 (11/15 0401) Cardiac Rhythm: Normal sinus rhythm (11/15 0700) Resp:  [15-24] 20 (11/15 0401) BP: (92-121)/(64-72) 118/72 (11/15 0401) SpO2:  [93 %-99 %] 95 % (11/15 0401) FiO2 (%):  [26 %] 26 % (11/14 0835) Weight:  [158 lb 1.6 oz (71.7 kg)] 158 lb 1.6 oz (71.7 kg) (11/15 0401)  Pre op weight 69.4 kg Current Weight  10/26/17 158 lb 1.6 oz (71.7 kg)   Intake/Output from previous day: 11/14 0701 - 11/15 0700 In: 0  Out: 300 [Urine:300]   Physical Exam:  Cardiovascular: RRR Pulmonary: Mostly clear to auscultation bilaterally Abdomen: Soft, non tender, bowel sounds present. Extremities: Mild bilateral lower extremity edema. Wounds: Aquacel removed and sternal wound is clean and dry.  No erythema or signs of infection.  Lab Results: CBC: Recent Labs    10/24/17 1732 10/25/17 0253  WBC 11.3* 10.0  HGB 9.9* 9.2*  HCT 30.3* 28.4*  PLT 189 202   BMET:  Recent Labs    10/24/17 0301 10/24/17 1659 10/24/17 1732 10/25/17 0253  NA 136 136  --  135  K 3.9 3.8  --  3.7  CL 105 96*  --  102  CO2 25  --   --  26  GLUCOSE 107* 151*  --  102*  BUN 6 6  --  6  CREATININE 0.70 0.70 0.76 0.84  CALCIUM 8.4*  --   --  8.5*    PT/INR:  Lab Results  Component Value Date   INR 1.35 10/23/2017   INR 0.99 10/22/2017   ABG:  INR: Will add last result for INR,  ABG once components are confirmed Will add last 4 CBG results once components are confirmed  Assessment/Plan:  1. CV - SR in the 90's. On Lopressor 12.5 mg bid. Will not start ACE as BP somewhat still labile. 2.  Pulmonary - On room air. CXR this am appears stable (no pneumothorax, small bilateral pleural effusions, cardiomegaly). Encourage incentive spirometer 3. Volume Overload - Will give Lasix this am. 4.  Acute blood loss anemia - Await this am's H and H 5. Continue PT 6. CBGs 138/99/86.  On Insulin. Pre op HGA1C 7.3. She was on Metformin pre op so will stop scheduled Insulin and start.  7. LOC constipation 8. Remove EPW 9. Possible discharge 1-2 days  ZIMMERMAN,DONIELLE MPA-C 10/26/2017,7:17 AM  Poss home sat I have seen and examined Aretta Nip and agree with the above assessment  and plan.  Grace Isaac MD Beeper [phone] Office [phone] 10/26/2017 3:22 PM

## 2017-10-26 NOTE — Progress Notes (Signed)
CARDIAC REHAB PHASE I   PRE:  Rate/Rhythm: 94 SR    BP: sitting 116/79    SaO2: 95 RA  MODE:  Ambulation: 200 ft   POST:  Rate/Rhythm: 106 ST    BP: sitting 113/83     SaO2: 96 RA  Pt moved out of bed well and ambulated with RW. Most of walk pt was independent with RW however toward end of walk (last 20-30 ft in hall) pts left leg fatigued significantly and she had to focus on lifting it. Pt sts this is normal for her. To EOB, took VS, then assisted pt to BR. Fatigue mostly resolved after she had rested. PT to see in pm.  [phone]  [place], ACSM 10/26/2017 11:24 AM

## 2017-10-26 NOTE — Discharge Instructions (Signed)
Coronary Artery Bypass Grafting, Care After °These instructions give you information on caring for yourself after your procedure. Your doctor may also give you more specific instructions. Call your doctor if you have any problems or questions after your procedure. °Follow these instructions at home: °· Only take medicine as told by your doctor. Take medicines exactly as told. Do not stop taking medicines or start any new medicines without talking to your doctor first. °· Take your pulse as told by your doctor. °· Do deep breathing as told by your doctor. Use your breathing device (incentive spirometer), if given, to practice deep breathing several times a day. Support your chest with a pillow or your arms when you take deep breaths or cough. °· Keep the area clean, dry, and protected where the surgery cuts (incisions) were made. Remove bandages (dressings) only as told by your doctor. If strips were applied to surgical area, do not take them off. They fall off on their own. °· Check the surgery area daily for puffiness (swelling), redness, or leaking fluid. °· If surgery cuts were made in your legs: °? Avoid crossing your legs. °? Avoid sitting for long periods of time. Change positions every 30 minutes. °? Raise your legs when you are sitting. Place them on pillows. °· Wear stockings that help keep blood clots from forming in your legs (compression stockings). °· Only take sponge baths until your doctor says it is okay to take showers. Pat the surgery area dry. Do not rub the surgery area with a washcloth or towel. Do not bathe, swim, or use a hot tub until your doctor says it is okay. °· Eat foods that are high in fiber. These include raw fruits and vegetables, whole grains, beans, and nuts. Choose lean meats. Avoid canned, processed, and fried foods. °· Drink enough fluids to keep your pee (urine) clear or pale yellow. °· Weigh yourself every day. °· Rest and limit activity as told by your doctor. You may be told  to: °? Stop any activity if you have chest pain, shortness of breath, changes in heartbeat, or dizziness. Get help right away if this happens. °? Move around often for short amounts of time or take short walks as told by your doctor. Gradually become more active. You may need help to strengthen your muscles and build endurance. °? Avoid lifting, pushing, or pulling anything heavier than 10 pounds (4.5 kg) for at least 6 weeks after surgery. °· Do not drive until your doctor says it is okay. °· Ask your doctor when you can go back to work. °· Ask your doctor when you can begin sexual activity again. °· Follow up with your doctor as told. °Contact a doctor if: °· You have puffiness, redness, more pain, or fluid draining from the incision site. °· You have a fever. °· You have puffiness in your ankles or legs. °· You have pain in your legs. °· You gain 2 or more pounds (0.9 kg) a day. °· You feel sick to your stomach (nauseous) or throw up (vomit). °· You have watery poop (diarrhea). °Get help right away if: °· You have chest pain that goes to your jaw or arms. °· You have shortness of breath. °· You have a fast or irregular heartbeat. °· You notice a "clicking" in your breastbone when you move. °· You have numbness or weakness in your arms or legs. °· You feel dizzy or light-headed. °This information is not intended to replace advice given to you by   your health care provider. Make sure you discuss any questions you have with your health care provider. °Document Released: 12/03/2013 Document Revised: 05/05/2016 Document Reviewed: 05/07/2013 °Elsevier Interactive Patient Education © 2017 [employer] Inc. ° °

## 2017-10-27 LAB — GLUCOSE, CAPILLARY
GLUCOSE-CAPILLARY: 118 mg/dL — AB (ref 65–99)
GLUCOSE-CAPILLARY: 196 mg/dL — AB (ref 65–99)
Glucose-Capillary: 113 mg/dL — ABNORMAL HIGH (ref 65–99)
Glucose-Capillary: 147 mg/dL — ABNORMAL HIGH (ref 65–99)
Glucose-Capillary: 120 mg/dL — ABNORMAL HIGH (ref 65–99)

## 2017-10-27 MED ORDER — METOPROLOL TARTRATE 25 MG PO TABS
25.0000 mg | ORAL_TABLET | Freq: Two times a day (BID) | ORAL | Status: DC
  Administered 2017-10-27 – 2017-10-28 (×3): 25 mg via ORAL
  Filled 2017-10-27 (×3): qty 1

## 2017-10-27 MED ORDER — POTASSIUM CHLORIDE CRYS ER 20 MEQ PO TBCR
40.0000 meq | EXTENDED_RELEASE_TABLET | Freq: Once | ORAL | Status: AC
  Administered 2017-10-27: 40 meq via ORAL
  Filled 2017-10-27: qty 2

## 2017-10-27 MED ORDER — KETOROLAC TROMETHAMINE 30 MG/ML IJ SOLN
30.0000 mg | Freq: Four times a day (QID) | INTRAMUSCULAR | Status: AC
  Administered 2017-10-27 (×2): 30 mg via INTRAVENOUS
  Filled 2017-10-27 (×2): qty 1

## 2017-10-27 NOTE — Plan of Care (Signed)
  Clinical Measurements: Will remain free from infection 10/27/2017 0139 - Progressing by Peggye Pitt, RN   Clinical Measurements: Respiratory complications will improve 10/27/2017 0139 - Progressing by Peggye Pitt, RN   Skin Integrity: Wound healing without signs and symptoms of infection 10/27/2017 0139 - Progressing by Peggye Pitt, RN   Skin Integrity: Risk for impaired skin integrity will decrease 10/27/2017 0139 - Progressing by Peggye Pitt, RN   Completed/Met Education: Knowledge of General Education information will improve 10/27/2017 0139 - Completed/Met by Peggye Pitt, RN Health Behavior/Discharge Planning: Ability to manage health-related needs will improve 10/27/2017 0139 - Completed/Met by Peggye Pitt, RN Nutrition: Adequate nutrition will be maintained 10/27/2017 0139 - Completed/Met by Peggye Pitt, RN Urinary Elimination: Ability to achieve and maintain adequate renal perfusion and functioning will improve 10/27/2017 0139 - Completed/Met by Peggye Pitt, RN   Completed/Met Education: Knowledge of General Education information will improve 10/27/2017 0139 - Completed/Met by Peggye Pitt, RN Health Behavior/Discharge Planning: Ability to manage health-related needs will improve 10/27/2017 0139 - Completed/Met by Peggye Pitt, RN Nutrition: Adequate nutrition will be maintained 10/27/2017 0139 - Completed/Met by Peggye Pitt, RN Urinary Elimination: Ability to achieve and maintain adequate renal perfusion and functioning will improve 10/27/2017 0139 - Completed/Met by Peggye Pitt, RN

## 2017-10-27 NOTE — Care Management Note (Addendum)
Case Management Note  Patient Details  Name: SURIYA KOVARIK MRN: [id number] Date of Birth: 08/30/1961  Subjective/Objective:         Pt presented from home alone for CABGx2.  Pt to return home where caregivers hired for daytime and her adult children will stay with her at night.  Pt has W/C, walker, shower chair and 3in1 at home.  Pt states she had a 4-prong cane that is damaged and needs a new one.           Action/Plan: Will obtain order for DME cane and have delivered by [facility].  Expected Discharge Date:   11/17 or 10/29/17               Expected Discharge Plan:  Home/Self Care  In-House Referral:  NA  Discharge planning Services  CM Consult  Post Acute Care Choice:  Durable Medical Equipment Choice offered to:  Patient  DME Arranged:  Kasandra Knudsen DME Agency:  [place]:  RN,PT [place] Agency:  [facility]  Status of Service:  Completed  If discussed at [place] of Stay Meetings, dates discussed:    Additional Comments: Spoke with Brad to deliver cane to room. Order obtained. Arley Phenix, RN 10/27/2017, 1:12 PM   10/28/17 1103  Pt has d/c order and [place] orders.  Choice offered to pt and daughter via phone.  [facility] chosen.  Referral called to [facility] with [facility] for RN and PT.  Brad aware of pt's address.  [facility] accepted referral and SOC to be within 48 hours of d/c. Kasandra Knudsen has been delivered to room. Claudie Leach, RN

## 2017-10-27 NOTE — Progress Notes (Addendum)
      [address]       [place] [address]             [phone]        4 Days Post-Op Procedure(s) (LRB): CORONARY ARTERY BYPASS GRAFTING (CABG) x two  , using left internal mammary artery and right leg greater saphenous vein harvested  endoscopically - LIMA to LAD, SVG to RCA (N/A) TRANSESOPHAGEAL ECHOCARDIOGRAM (TEE) (N/A)  Subjective: Patient had bowel movement yesterday. She has a lot of sternal, incisional pain this am. Also, has some numbness left of sternal border.  Objective: Vital signs in last 24 hours: Temp:  [98.8 F (37.1 C)-100.5 F (38.1 C)] 100.5 F (38.1 C) (11/16 0533) Pulse Rate:  [94-109] 109 (11/16 0533) Cardiac Rhythm: Sinus tachycardia (11/16 0700) Resp:  [20-25] 25 (11/16 0533) BP: (114-135)/(76-87) 120/78 (11/16 0533) SpO2:  [94 %-96 %] 94 % (11/16 0533) Weight:  [155 lb 3.2 oz (70.4 kg)] 155 lb 3.2 oz (70.4 kg) (11/16 0533)  Pre op weight 69.4 kg Current Weight  10/27/17 155 lb 3.2 oz (70.4 kg)   Intake/Output from previous day: 11/15 0701 - 11/16 0700 In: 1200 [P.O.:1200] Out: -    Physical Exam:  Cardiovascular: Slightly tachycardic Pulmonary: Mostly clear to auscultation bilaterally Abdomen: Soft, non tender, bowel sounds present. Extremities: Trace bilateral lower extremity edema. Wounds: Sternal wound is clean and dry.  No erythema or signs of infection. RLE clean and dry.  Lab Results: CBC: Recent Labs    10/25/17 0253 10/26/17 0915  WBC 10.0 10.2  HGB 9.2* 9.5*  HCT 28.4* 29.6*  PLT 202 243   BMET:  Recent Labs    10/25/17 0253 10/26/17 0915  NA 135 138  K 3.7 3.5  CL 102 103  CO2 26 28  GLUCOSE 102* 155*  BUN 6 8  CREATININE 0.84 0.87  CALCIUM 8.5* 8.6*    PT/INR:  Lab Results  Component Value Date   INR 1.35 10/23/2017   INR 0.99 10/22/2017   ABG:  INR: Will add last result for INR, ABG once components are confirmed Will add last 4 CBG results once components are  confirmed  Assessment/Plan:  1. CV - Slightly tachy in the low 100's this am. On Lopressor 12.5 mg bid.Will increase Lopressor to 25 mg bid. 2.  Pulmonary - On room air. Encourage incentive spirometer 3. Volume Overload - On Lasix 40 mg daily.  4.  Acute blood loss anemia -  H and H stable at 9.5 and 29.6 5. CBGs 114/132/147.  On Metformin . Pre op HGA1C 7.3.  6. Will supplement potassium as later lab draw yesterday was 3.5 7. Give 2 doses of Toradol (creatinine normal) to help with incisional pain. Also, numbness left of sternum likely related to takedown of LIMA. Hopefully, will improve with time. 8. Likely home in am  Donielle M ZimmermanPA-C 10/27/2017,7:13 AM  Plan d/c home in am with home nurse and home pt  I have seen and examined Aretta Nip and agree with the above assessment  and plan.  Grace Isaac MD Beeper [phone] Office [phone] 10/27/2017 3:59 PM

## 2017-10-27 NOTE — Progress Notes (Signed)
[id number] Pt just walked with staff so did not walk again. Pt stated she has walker at home to use. Education completed with pt who voiced understanding. Reviewed sternal precautions, IS, carb counting and healthy food choices. Discussed CRP 2 and will send a letter of interest to [place]. Pt has seen discharge video. Pt only able to walk short distances at home and uses scooter for grocery shopping. Encouraged walking as tolerated since her leg weakness from stroke limits her. Graylon Good RN BSN 10/27/2017 12:13 PM

## 2017-10-28 LAB — GLUCOSE, CAPILLARY
GLUCOSE-CAPILLARY: 142 mg/dL — AB (ref 65–99)
Glucose-Capillary: 118 mg/dL — ABNORMAL HIGH (ref 65–99)

## 2017-10-28 MED ORDER — OXYCODONE HCL 5 MG PO TABS: 5.0000 mg | ORAL_TABLET | ORAL | 0 refills | Status: DC | PRN

## 2017-10-28 MED ORDER — METOPROLOL TARTRATE 25 MG PO TABS: 25.0000 mg | ORAL_TABLET | Freq: Two times a day (BID) | ORAL | 1 refills | Status: DC

## 2017-10-28 NOTE — Progress Notes (Signed)
      [address]       [place] [address]             [phone]      5 Days Post-Op Procedure(s) (LRB): CORONARY ARTERY BYPASS GRAFTING (CABG) x two  , using left internal mammary artery and right leg greater saphenous vein harvested  endoscopically - LIMA to LAD, SVG to RCA (N/A) TRANSESOPHAGEAL ECHOCARDIOGRAM (TEE) (N/A) Subjective: Still having some sternal incision pain  Objective: Vital signs in last 24 hours: Temp:  [98.3 F (36.8 C)-99.7 F (37.6 C)] 98.3 F (36.8 C) (11/17 0845) Pulse Rate:  [96-106] 106 (11/16 2147) Cardiac Rhythm: Normal sinus rhythm (11/17 0806) Resp:  [20-26] 21 (11/17 0845) BP: (102-139)/(64-84) 127/84 (11/17 0421) SpO2:  [95 %-97 %] 97 % (11/17 0421) Weight:  [153 lb 14.4 oz (69.8 kg)] 153 lb 14.4 oz (69.8 kg) (11/17 0421)     Intake/Output from previous day: 11/16 0701 - 11/17 0700 In: 460 [P.O.:460] Out: -  Intake/Output this shift: No intake/output data recorded.  General appearance: alert, cooperative and no distress Heart: regular rate and rhythm, S1, S2 normal, no murmur, click, rub or gallop Lungs: clear to auscultation bilaterally Abdomen: soft, non-tender; bowel sounds normal; no masses,  no organomegaly Extremities: extremities normal, atraumatic, no cyanosis or edema Wound: clean and dry  Lab Results: Recent Labs    10/26/17 0915  WBC 10.2  HGB 9.5*  HCT 29.6*  PLT 243   BMET:  Recent Labs    10/26/17 0915  NA 138  K 3.5  CL 103  CO2 28  GLUCOSE 155*  BUN 8  CREATININE 0.87  CALCIUM 8.6*    PT/INR: No results for input(s): LABPROT, INR in the last 72 hours. ABG    Component Value Date/Time   PHART 7.402 10/23/2017 1801   HCO3 26.0 10/23/2017 1801   TCO2 28 10/24/2017 1659   ACIDBASEDEF 1.0 10/23/2017 1650   O2SAT 99.0 10/23/2017 1801   CBG (last 3)  Recent Labs    10/27/17 1608 10/27/17 2144 10/28/17 0612  GLUCAP 118* 196* 118*    Assessment/Plan: S/P Procedure(s)  (LRB): CORONARY ARTERY BYPASS GRAFTING (CABG) x two  , using left internal mammary artery and right leg greater saphenous vein harvested  endoscopically - LIMA to LAD, SVG to RCA (N/A) TRANSESOPHAGEAL ECHOCARDIOGRAM (TEE) (N/A)  1. CV - Slightly tachy in the low 100's this am. On Lopressor to 25 mg bid. 2.  Pulmonary - On room air. Encourage incentive spirometer 3. Volume Overload - On Lasix 40 mg daily.  4.  Acute blood loss anemia -  H and H stable at 9.5 and 29.6 5. CBGs 114/132/147.  On Metformin . Pre op HGA1C 7.3.  6. Gave 2 doses of Toradol yesterday. Will give oxycodone today. It has been helping.   Plan: Home today with home health and home PT.        LOS: 8 days    Elgie Collard 10/28/2017

## 2017-10-28 NOTE — Progress Notes (Signed)
CARDIAC REHAB PHASE I   PRE:  Rate/Rhythm: 97 SR  BP:  Sitting: 130/79      SaO2: 97% RA  MODE:  Ambulation: 200 ft   POST:  Rate/Rhythm: 111 ST  BP:  Sitting: 131/85      SaO2: 95% RA  Pt got out of bed well with assistance. Pt ambulated with walker for 200 ft. Slow and steady gait. Pt's leg was fatigued toward in of walk. Pt denied complaints of CP or SOB. Pt returned to edge of bed. Assisted pt with putting on underwear, pants and shoes. Pt assisted back to bed. Reinforced use of IS and walking as her leg tolerated. Call bell within reach. Pt awaiting discharge.    [phone]  Carma Lair MS, ACSM CEP  9:53 AM 10/28/2017

## 2017-10-28 NOTE — Plan of Care (Signed)
  Clinical Measurements: Diagnostic test results will improve 10/28/2017 0100 - Completed/Met by Peggye Pitt, RN   Activity: Risk for activity intolerance will decrease 10/28/2017 0100 - Completed/Met by Peggye Pitt, RN   Pain Managment: General experience of comfort will improve 10/28/2017 0100 - Completed/Met by Peggye Pitt, RN   Safety: Ability to remain free from injury will improve 10/28/2017 0100 - Completed/Met by Peggye Pitt, RN   Education: Ability to demonstrate proper wound care will improve 10/28/2017 0100 - Completed/Met by Peggye Pitt, RN

## 2017-10-28 NOTE — Progress Notes (Signed)
Pt was educated about signs and symptoms of infection, when to notify the doctor, current medication list, and doctor's appointments.  Patient acknowledged understanding and all questions were answered regarding after visit summary.  VS were stable at discharge.

## 2017-10-30 DIAGNOSIS — Z48812 Encounter for surgical aftercare following surgery on the circulatory system: Secondary | ICD-10-CM | POA: Diagnosis not present

## 2017-10-30 DIAGNOSIS — Z951 Presence of aortocoronary bypass graft: Secondary | ICD-10-CM | POA: Insufficient documentation

## 2017-10-30 NOTE — Progress Notes (Deleted)
Cardiology Office Note  Date:  10/30/2017   ID:  Kayla Malone, DOB 08/24/1961, MRN [id number]  PCP:  Yetta Barre, PA-C   No chief complaint on file.   HPI:  This is a 56 y.o.African American female 1. Unstable angina ([place]) 2. Coronary artery disease 1. Diabetes mellitus  2. Hypertension 3. GERD (gastroesophageal reflux disease) 4. Depression 5. Stroke ([place]) 6. Asthma 7. Thyroid cyst 8. History of hiatal hernia 9. ABL anemia   June 2018 after [facility] for chest pain.    given steroids  seen by Dr. Chancy Milroy.  abnormal EKG and subsequently underwent cardiac stress testing and echocardiogram with positive results and was referred for cardiac catheterization.       Cardiac catheterization October 09, 2017  [place] lesion, 45 %stenosed.  Prox LAD to Mid LAD lesion, 90 %stenosed.  Mid RCA lesion, 80 %stenosed.  Dist RCA lesion, 60 %stenosed.   Coronary artery bypass grafting x2 with the left internal mammary to the left anterior descending coronary artery and reversed saphenous vein graft to the posterior descending coronary artery with right thigh greater saphenous endo vein harvesting.     PMH:   has a past medical history of Asthma, Coronary artery disease, Depression, Diabetes mellitus without complication ([place]), GERD (gastroesophageal reflux disease), History of hiatal hernia, Hypertension, Pain, Stroke ([place]), and Thyroid cyst.  PSH:    Past Surgical History:  Procedure Laterality Date  . ABDOMINAL HYSTERECTOMY    . CATARACT EXTRACTION PHACO AND INTRAOCULAR LENS PLACEMENT ([place]) Left 08/15/2017   Performed by Birder Robson, MD at [facility] ORS  . CORONARY ARTERY BYPASS GRAFTING (CABG) x two  , using left internal mammary artery and right leg greater saphenous vein harvested  endoscopically - LIMA to LAD, SVG to RCA N/A 10/23/2017   Performed by Grace Isaac, MD at [facility]  . EYE SURGERY    . LEFT HEART CATH AND CORONARY ANGIOGRAPHY  Left 10/09/2017   Performed by Dionisio [name], MD at [facility]  . TRANSESOPHAGEAL ECHOCARDIOGRAM (TEE) N/A 10/23/2017   Performed by Grace Isaac, MD at [facility] OR    Current Outpatient Medications  Medication Sig Dispense Refill  . acetaminophen (TYLENOL) 500 MG tablet Take 1,000 mg by mouth every 6 (six) hours as needed for mild pain or headache.    . albuterol (PROVENTIL HFA;VENTOLIN HFA) 108 (90 Base) MCG/ACT inhaler Inhale 2 puffs into the lungs every 6 (six) hours as needed for wheezing or shortness of breath.     Marland Kitchen aspirin EC 81 MG tablet Take 81 mg by mouth daily.    Marland Kitchen atorvastatin (LIPITOR) 40 MG tablet Take 40 mg by mouth daily.    Marland Kitchen buPROPion (ZYBAN) 150 MG 12 hr tablet Take 150 mg by mouth 2 (two) times daily.  5  . fluticasone (FLONASE) 50 MCG/ACT nasal spray Place 2 sprays into both nostrils daily.    . Fluticasone-Salmeterol (ADVAIR DISKUS) 500-50 MCG/DOSE AEPB Inhale 1 puff into the lungs daily as needed (SOB).     . metFORMIN (GLUCOPHAGE) 500 MG tablet Take 500 mg by mouth 3 (three) times daily.   5  . metoprolol tartrate (LOPRESSOR) 25 MG tablet Take 1 tablet (25 mg total) 2 (two) times daily by mouth. 60 tablet 1  . oxyCODONE (OXY IR/ROXICODONE) 5 MG immediate release tablet Take 1 tablet (5 mg total) every 4 (four) hours as needed by mouth for severe pain. 30 tablet 0  . pantoprazole (PROTONIX) 40  MG tablet Take 40 mg by mouth.     . sitaGLIPtin (JANUVIA) 50 MG tablet Take 50 mg by mouth daily.    . traZODone (DESYREL) 50 MG tablet Take 50 mg by mouth at bedtime.  5  . Vitamin D, Ergocalciferol, (DRISDOL) 50000 units CAPS capsule Take 50,000 Units every 7 (seven) days by mouth. tuesday     No current facility-administered medications for this visit.      Allergies:   Lisinopril and Tramadol   Social History:  The patient  reports that she quit smoking about 19 years ago. she has never used smokeless tobacco. She reports that she does not drink alcohol or  use drugs.   Family History:   family history includes COPD in her brother; Cirrhosis in her father; Coronary artery disease in her brother, father, and sister; Diabetes type II in her father; Heart failure in her mother; Hypertension in her father; Kidney disease in her father; Pulmonary embolism in her mother; Stroke in her sister.    Review of Systems: ROS   PHYSICAL EXAM: VS:  There were no vitals taken for this visit. , BMI There is no height or weight on file to calculate BMI. GEN: Well nourished, well developed, in no acute distress HEENT: normal Neck: no JVD, carotid bruits, or masses Cardiac: RRR; no murmurs, rubs, or gallops,no edema  Respiratory:  clear to auscultation bilaterally, normal work of breathing GI: soft, nontender, nondistended, + BS MS: no deformity or atrophy Skin: warm and dry, no rash Neuro:  Strength and sensation are intact Psych: euthymic mood, full affect    Recent Labs: 10/23/2017: ALT 12 10/24/2017: Magnesium 1.9 10/26/2017: BUN 8; Creatinine, Ser 0.87; Hemoglobin 9.5; Platelets 243; Potassium 3.5; Sodium 138    Lipid Panel No results found for: CHOL, HDL, LDLCALC, TRIG    Wt Readings from Last 3 Encounters:  10/28/17 153 lb 14.4 oz (69.8 kg)  10/12/17 158 lb (71.7 kg)  10/09/17 150 lb (68 kg)       ASSESSMENT AND PLAN:  No diagnosis found.   Disposition:   F/U  6 months  No orders of the defined types were placed in this encounter.    Signed, Esmond Plants, M.D., Ph.D. 10/30/2017  [place], [place]

## 2017-10-31 ENCOUNTER — Ambulatory Visit: Payer: Medicare Other | Admitting: Cardiovascular Disease

## 2017-11-14 ENCOUNTER — Ambulatory Visit (INDEPENDENT_AMBULATORY_CARE_PROVIDER_SITE_OTHER): Payer: Self-pay

## 2017-11-14 DIAGNOSIS — Z4889 Encounter for other specified surgical aftercare: Secondary | ICD-10-CM

## 2017-12-07 ENCOUNTER — Ambulatory Visit: Payer: Medicare Other | Admitting: Cardiothoracic Surgery

## 2017-12-07 ENCOUNTER — Encounter: Payer: Self-pay | Admitting: Cardiothoracic Surgery

## 2017-12-07 ENCOUNTER — Other Ambulatory Visit: Payer: Self-pay

## 2017-12-07 ENCOUNTER — Ambulatory Visit (INDEPENDENT_AMBULATORY_CARE_PROVIDER_SITE_OTHER): Payer: Self-pay | Admitting: Cardiothoracic Surgery

## 2017-12-07 ENCOUNTER — Ambulatory Visit
Admission: RE | Admit: 2017-12-07 | Discharge: 2017-12-07 | Disposition: A | Discharge status: Home / Self Care | Payer: Medicare Other | Source: Ambulatory Visit | Attending: Cardiothoracic Surgery | Admitting: Cardiothoracic Surgery

## 2017-12-07 VITALS — BP 148/88 | HR 58 | Resp 18 | Ht 63.0 in | Wt 135.0 lb

## 2017-12-07 DIAGNOSIS — Z951 Presence of aortocoronary bypass graft: Secondary | ICD-10-CM

## 2017-12-07 DIAGNOSIS — I2510 Atherosclerotic heart disease of native coronary artery without angina pectoris: Secondary | ICD-10-CM

## 2017-12-07 NOTE — Patient Instructions (Signed)
    [address]       [place] [address]             [phone]       Coronary Artery Bypass Grafting  Care After  Refer to this sheet in the next few weeks. These instructions provide you with information on caring for yourself after your procedure. Your caregiver may also give you more specific instructions. Your treatment has been planned according to current medical practices, but problems sometimes occur. Call your caregiver if you have any problems or questions after your procedure.  Recovery from open heart surgery will be different for everyone. Some people feel well after 3 or 4 weeks, while for others it takes longer. After heart surgery, it may be normal to:  Not have an appetite, feel nauseated by the smell of food, or only want to eat a small amount.   Be constipated because of changes in your diet, activity, and medicines. Eat foods high in fiber. Add fresh fruits and vegetables to your diet. Stool softeners may be helpful.   Feel sad or unhappy. You may be frustrated or cranky. You may have good days and bad days. Do not give up. Talk to your caregiver if you do not feel better.   Feel weakness and fatigue. You many need physical therapy or cardiac rehabilitation to get your strength back.   Develop an irregular heartbeat called atrial fibrillation. Symptoms of atrial fibrillation are a fast, irregular heartbeat or feelings of fluttery heartbeats, shortness of breath, low blood pressure, and dizziness. If these symptoms develop, see your caregiver right away.  MEDICATION  Have a list of all the medicines you will be taking when you leave the hospital. For every medicine, know the following:   Name.   Exact dose.   Time of day to be taken.   How often it should be taken.   Why you are taking it.   Ask which medicines should or should not be taken together. If you take more than one heart medicine, ask if it is okay to take them together. Some  heart medicines should not be taken at the same time because they may lower your blood pressure too much.   Narcotic pain medicine can cause constipation. Eat fresh fruits and vegetables. Add fiber to your diet. Stool softener medicine may help relieve constipation.   Keep a copy of your medicines with you at all times.   Do not add or stop taking any medicine until you check with your caregiver.   Medicines can have side effects. Call your caregiver who prescribed the medicine if you:   Start throwing up, have diarrhea, or have stomach pain.   Feel dizzy or lightheaded when you stand up.   Feel your heart is skipping beats or is beating too fast or too slow.   Develop a rash.   Notice unusual bruising or bleeding.  HOME CARE INSTRUCTIONS  After heart surgery, it is important to learn how to take your pulse. Have your caregiver show you how to take your pulse.   Use your incentive spirometer. Ask your caregiver how long after surgery you need to use it.  Care of your chest incision  Tell your caregiver right away if you notice clicking in your chest (sternum).   Support your chest with a pillow or your arms when you take deep breaths and cough.   Follow your caregiver's instructions about when you can bathe or   swim.   Protect your incision from sunlight during the first year to keep the scar from getting dark.   Tell your caregiver if you notice:   Increased tenderness of your incision.   Increased redness or swelling around your incision.   Drainage or pus from your incision.  Care of your leg incision(s)  Avoid crossing your legs.   Avoid sitting for long periods of time. Change positions every half hour.   Elevate your leg(s) when you are sitting.   Check your leg(s) daily for swelling. Check the incisions for redness or drainage.   Diet is very important to heart health.   Eat plenty of fresh fruits and vegetables. Meats should be lean cut. Avoid canned,  processed, and fried foods.   Talk to a dietician. They can teach you how to make healthy food and drink choices.  Weight  Weigh yourself every day. This is important because it helps to know if you are retaining fluid that may make your heart and lungs work harder.   Use the same scale each time.   Weigh yourself every morning at the same time. You should do this after you go to the bathroom, but before you eat breakfast.   Your weight will be more accurate if you do not wear any clothes.   Record your weight.   Tell your caregiver if you have gained 2 pounds or more overnight.  Activity Stop any activity at once if you have chest pain, shortness of breath, irregular heartbeats, or dizziness. Get help right away if you have any of these symptoms.  Bathing.  Avoid soaking in a bath or hot tub until your incisions are healed.   Rest. You need a balance of rest and activity.   Exercise. Exercise per your caregiver's advice. You may need physical therapy or cardiac rehabilitation to help strengthen your muscles and build your endurance.   Climbing stairs. Unless your caregiver tells you not to climb stairs, go up stairs slowly and rest if you tire. Do not pull yourself up by the handrail.   Driving a car. Follow your caregiver's advice on when you may drive. You may ride as a passenger at any time. When traveling for long periods of time in a car, get out of the car and walk around for a few minutes every 2 hours.   Lifting. Avoid lifting, pushing, or pulling anything heavier than 10 pounds for 6 weeks after surgery or as told by your caregiver.   Returning to work. Check with your caregiver. People heal at different rates. Most people will be able to go back to work 6 to 12 weeks after surgery.   Sexual activity. You may resume sexual relations as told by your caregiver.  SEEK MEDICAL CARE IF:  Any of your incisions are red, painful, or have any type of drainage coming from them.     You have an oral temperature above 101.5 F .   You have ankle or leg swelling.   You have pain in your legs.   You have weight gain of 2 or more pounds a day.   You feel dizzy or lightheaded when you stand up.  SEEK IMMEDIATE MEDICAL CARE IF:  You have angina or chest pain that goes to your jaw or arms. Call your local emergency services right away.   You have shortness of breath at rest or with activity.   You have a fast or irregular heartbeat (arrhythmia).   There is   a "clicking" in your sternum when you move.   You have numbness or weakness in your arms or legs.  MAKE SURE YOU:  Understand these instructions.   Will watch your condition.   Will get help right away if you are not doing well or get worse.    No lifting over 25 lbs for 3 months 

## 2017-12-07 NOTE — Progress Notes (Signed)
[address]       [place] [address]             [phone]      Kayla Malone [facility] Medical Record #[id number] Date of Birth: July 12, 1961  Referring: Yetta Barre, PA-C Primary Care: Yetta Barre, PA-C Primary Cardiologist: Dionisio [name], MD      Chief Complaint:   POST OP FOLLOW UP 10/20/2017 5 Days Post-Op Procedure(s) (LRB): CORONARY ARTERY BYPASS GRAFTING (CABG) x two  , using left internal mammary artery and right leg greater saphenous vein harvested  endoscopically - LIMA to LAD, SVG to RCA (N/A) TRANSESOPHAGEAL ECHOCARDIOGRAM (TEE) (N/A)   History of Present Illness:     Patient returns today for follow-up visit after recent coronary artery bypass grafting.  He has been having significant unstable anginal symptoms prior to surgery.  She notes she says no angina now.     Past Medical History:  Diagnosis Date  . Asthma   . Coronary artery disease   . Depression   . Diabetes mellitus without complication ([place])   . GERD (gastroesophageal reflux disease)   . History of hiatal hernia   . Hypertension   . Pain    BACK  . Stroke ([place])    x3  . Thyroid cyst      Social History   Tobacco Use  Smoking Status Former Smoker  . Last attempt to quit: 09/13/1998  . Years since quitting: 19.2  Smokeless Tobacco Never Used    Social History   Substance and Sexual Activity  Alcohol Use No     Allergies  Allergen Reactions  . Lisinopril   . Tramadol Other (See Comments)    sleepiness    Current Outpatient Medications  Medication Sig Dispense Refill  . acetaminophen (TYLENOL) 500 MG tablet Take 1,000 mg by mouth every 6 (six) hours as needed for mild pain or headache.    . albuterol (PROVENTIL HFA;VENTOLIN HFA) 108 (90 Base) MCG/ACT inhaler Inhale 2 puffs into the lungs every 6 (six) hours as needed for wheezing or shortness of breath.     Marland Kitchen aspirin EC 81 MG tablet Take 81 mg by mouth daily.    Marland Kitchen atorvastatin (LIPITOR)  40 MG tablet Take 40 mg by mouth daily.    Marland Kitchen buPROPion (ZYBAN) 150 MG 12 hr tablet Take 150 mg by mouth 2 (two) times daily.  5  . fluticasone (FLONASE) 50 MCG/ACT nasal spray Place 2 sprays into Malone nostrils daily.    . Fluticasone-Salmeterol (ADVAIR DISKUS) 500-50 MCG/DOSE AEPB Inhale 1 puff into the lungs daily as needed (SOB).     . metFORMIN (GLUCOPHAGE) 500 MG tablet Take 500 mg by mouth 3 (three) times daily.   5  . metoprolol tartrate (LOPRESSOR) 25 MG tablet Take 1 tablet (25 mg total) 2 (two) times daily by mouth. 60 tablet 1  . oxyCODONE (OXY IR/ROXICODONE) 5 MG immediate release tablet Take 1 tablet (5 mg total) every 4 (four) hours as needed by mouth for severe pain. 30 tablet 0  . pantoprazole (PROTONIX) 40 MG tablet Take 40 mg by mouth.     . sitaGLIPtin (JANUVIA) 50 MG tablet Take 50 mg by mouth daily.    . traZODone (DESYREL) 50 MG tablet Take 50 mg by mouth at bedtime.  5  . Vitamin D, Ergocalciferol, (DRISDOL) 50000 units CAPS capsule Take 50,000 Units every 7 (seven) days by mouth. tuesday     No  current facility-administered medications for this visit.        Physical Exam: BP (!) 148/88 (BP Location: Right Arm, Patient Position: Sitting, Cuff Size: Normal)   Pulse (!) 58   Resp 18   Ht 5\' 3"  (1.6 m)   Wt 135 lb (61.2 kg)   SpO2 99% Comment: on RA  BMI 23.91 kg/m   General appearance: alert and cooperative Neurologic: intact Heart: regular rate and rhythm, S1, S2 normal, no murmur, click, rub or gallop Lungs: clear to auscultation bilaterally Abdomen: soft, non-tender; bowel sounds normal; no masses,  no organomegaly Extremities: extremities normal, atraumatic, no cyanosis or edema and Homans sign is negative, no sign of DVT Wound: Sternal incision is stable and healing well, right vein harvest site at the knee is also healing well without evidence of infection   Diagnostic Studies & Laboratory data:     Recent Radiology Findings:   Dg Chest 2  View  Result Date: 12/07/2017 CLINICAL DATA:  Chest pain EXAM: CHEST  2 VIEW COMPARISON:  10/26/2017 FINDINGS: Normal heart size. Lungs clear. No pneumothorax. No pleural effusion. IMPRESSION: No active cardiopulmonary disease. Electronically Signed   By: Marybelle Killings M.D.   On: 12/07/2017 11:53    I have independently reviewed the above radiology studies  and reviewed the findings with the patient.   Recent Lab Findings: Lab Results  Component Value Date   WBC 10.2 10/26/2017   HGB 9.5 (L) 10/26/2017   HCT 29.6 (L) 10/26/2017   PLT 243 10/26/2017   GLUCOSE 155 (H) 10/26/2017   ALT 12 (L) 10/23/2017   AST 27 10/23/2017   NA 138 10/26/2017   K 3.5 10/26/2017   CL 103 10/26/2017   CREATININE 0.87 10/26/2017   BUN 8 10/26/2017   CO2 28 10/26/2017   INR 1.35 10/23/2017   HGBA1C 7.3 (H) 10/23/2017      Assessment / Plan:      Patient status post coronary artery bypass grafting, making good progress postoperatively. She has had no recurrent angina or evidence of congestive heart failure I stressed the importance to her to continue with good control of her diabetes for the best long-term result from her bypass surgery. She has follow-up appointment with cardiology tomorrow. I have encouraged her to start in the cardiac rehab program at [facility] in the coming weeks.    Grace Isaac MD      [address] [place] [address] Office [phone]   Beeper [phone]  12/07/2017 12:24 PM

## 2017-12-15 ENCOUNTER — Telehealth: Payer: Self-pay

## 2017-12-15 NOTE — Telephone Encounter (Signed)
Kayla Malone with Physical Therapy called concerned about patient having a loss of appetite, and feeling of fullness after eating very small meals.  Patient contacted about symptoms.  She stated that she is going to follow-up with her medical doctor in regards to these changes.

## 2017-12-19 ENCOUNTER — Other Ambulatory Visit: Payer: Self-pay | Admitting: Internal Medicine

## 2017-12-19 DIAGNOSIS — R109 Unspecified abdominal pain: Secondary | ICD-10-CM

## 2017-12-25 ENCOUNTER — Ambulatory Visit: Payer: Medicare Other

## 2017-12-25 ENCOUNTER — Ambulatory Visit
Admission: RE | Admit: 2017-12-25 | Discharge: 2017-12-25 | Disposition: A | Discharge status: Home / Self Care | Payer: Medicare Other | Source: Ambulatory Visit | Attending: Internal Medicine | Admitting: Internal Medicine

## 2017-12-25 DIAGNOSIS — R109 Unspecified abdominal pain: Secondary | ICD-10-CM

## 2017-12-25 DIAGNOSIS — R110 Nausea: Secondary | ICD-10-CM | POA: Insufficient documentation

## 2017-12-28 ENCOUNTER — Encounter: Payer: Self-pay | Admitting: *Deleted

## 2017-12-28 ENCOUNTER — Encounter: Payer: Medicare Other | Attending: Cardiovascular Disease | Admitting: *Deleted

## 2017-12-28 VITALS — Ht 64.5 in | Wt 149.1 lb

## 2017-12-28 DIAGNOSIS — Z951 Presence of aortocoronary bypass graft: Secondary | ICD-10-CM | POA: Insufficient documentation

## 2017-12-28 DIAGNOSIS — Z87891 Personal history of nicotine dependence: Secondary | ICD-10-CM | POA: Diagnosis not present

## 2017-12-28 NOTE — Patient Instructions (Signed)
Patient Instructions  Patient Details  Name: Kayla Malone MRN: [id number] Date of Birth: Apr 25, 1961 Referring Provider:  Dionisio [name], MD  Below are your personal goals for exercise, nutrition, and risk factors. Our goal is to help you stay on track towards obtaining and maintaining these goals. We will be discussing your progress on these goals with you throughout the program.  Initial Exercise Prescription: Initial Exercise Prescription - 12/28/17 1500      Date of Initial Exercise RX and Referring Provider   Date  12/28/17    Referring Provider  Humphrey Rolls      Recumbant Bike   Level  1    RPM  60    Minutes  15    METs  1.5      NuStep   Level  1    SPM  80    Minutes  15    METs  1.5      Biostep-RELP   Level  1    SPM  50    Minutes  15    METs  2      Track   Laps  15    Minutes  15    METs  1.5      Prescription Details   Frequency (times per week)  3    Duration  Progress to 45 minutes of aerobic exercise without signs/symptoms of physical distress      Intensity   THRR 40-80% of Max Heartrate  107-145    Ratings of Perceived Exertion  11-13    Perceived Dyspnea  0-4      Resistance Training   Training Prescription  Yes    Weight  2 lb    Reps  10-15       Exercise Goals: Frequency: Be able to perform aerobic exercise two to three times per week in program working toward 2-5 days per week of home exercise.  Intensity: Work with a perceived exertion of 11 (fairly light) - 15 (hard) while following your exercise prescription.  We will make changes to your prescription with you as you progress through the program.   Duration: Be able to do 30 to 45 minutes of continuous aerobic exercise in addition to a 5 minute warm-up and a 5 minute cool-down routine.   Nutrition Goals: Your personal nutrition goals will be established when you do your nutrition analysis with the dietician.  The following are general nutrition guidelines to  follow: Cholesterol < 200mg /day Sodium < 1500mg /day Fiber: Women over 50 yrs - 21 grams per day  Personal Goals: Personal Goals and Risk Factors at Admission - 12/28/17 1351      Core Components/Risk Factors/Patient Goals on Admission    Weight Management  Yes;Weight Maintenance    Intervention  Weight Management: Develop a combined nutrition and exercise program designed to reach desired caloric intake, while maintaining appropriate intake of nutrient and fiber, sodium and fats, and appropriate energy expenditure required for the weight goal.;Weight Management: Provide education and appropriate resources to help participant work on and attain dietary goals.    Admit Weight  135 lb (61.2 kg) [place] reports having a no desire to eat and has been losing weight without trying    Expected Outcomes  Short Term: Continue to assess and modify interventions until short term weight is achieved;Long Term: Adherence to nutrition and physical activity/exercise program aimed toward attainment of established weight goal;Weight Maintenance: Understanding of the daily nutrition guidelines, which includes 25-35% calories from fat,  7% or less cal from saturated fats, less than 200mg  cholesterol, less than 1.5gm of sodium, & 5 or more servings of fruits and vegetables daily;Understanding recommendations for meals to include 15-35% energy as protein, 25-35% energy from fat, 35-60% energy from carbohydrates, less than 200mg  of dietary cholesterol, 20-35 gm of total fiber daily;Understanding of distribution of calorie intake throughout the day with the consumption of 4-5 meals/snacks    Diabetes  Yes    Intervention  Provide education about signs/symptoms and action to take for hypo/hyperglycemia.;Provide education about proper nutrition, including hydration, and aerobic/resistive exercise prescription along with prescribed medications to achieve blood glucose in normal ranges: Fasting glucose 65-99 mg/dL    Expected  Outcomes  Long Term: Attainment of HbA1C < 7%.;Short Term: Participant verbalizes understanding of the signs/symptoms and immediate care of hyper/hypoglycemia, proper foot care and importance of medication, aerobic/resistive exercise and nutrition plan for blood glucose control.    Hypertension  Yes    Intervention  Provide education on lifestyle modifcations including regular physical activity/exercise, weight management, moderate sodium restriction and increased consumption of fresh fruit, vegetables, and low fat dairy, alcohol moderation, and smoking cessation.;Monitor prescription use compliance.    Expected Outcomes  Short Term: Continued assessment and intervention until BP is < 140/23mm HG in hypertensive participants. < 130/45mm HG in hypertensive participants with diabetes, heart failure or chronic kidney disease.;Long Term: Maintenance of blood pressure at goal levels.    Lipids  Yes    Intervention  Provide education and support for participant on nutrition & aerobic/resistive exercise along with prescribed medications to achieve LDL 70mg , HDL >40mg .    Expected Outcomes  Short Term: Participant states understanding of desired cholesterol values and is compliant with medications prescribed. Participant is following exercise prescription and nutrition guidelines.;Long Term: Cholesterol controlled with medications as prescribed, with individualized exercise RX and with personalized nutrition plan. Value goals: LDL < 70mg , HDL > 40 mg.       Tobacco Use Initial Evaluation: Social History   Tobacco Use  Smoking Status Former Smoker  . Last attempt to quit: 09/13/1998  . Years since quitting: 19.3  Smokeless Tobacco Never Used    Exercise Goals and Review: Exercise Goals    Row Name 12/28/17 1459             Exercise Goals   Increase Physical Activity  Yes       Intervention  Provide advice, education, support and counseling about physical activity/exercise needs.;Develop an  individualized exercise prescription for aerobic and resistive training based on initial evaluation findings, risk stratification, comorbidities and participant's personal goals.       Expected Outcomes  Short Term: Attend rehab on a regular basis to increase amount of physical activity.;Long Term: Add in home exercise to make exercise part of routine and to increase amount of physical activity.;Long Term: Exercising regularly at least 3-5 days a week.       Increase Strength and Stamina  Yes       Intervention  Provide advice, education, support and counseling about physical activity/exercise needs.;Develop an individualized exercise prescription for aerobic and resistive training based on initial evaluation findings, risk stratification, comorbidities and participant's personal goals.       Expected Outcomes  Short Term: Increase workloads from initial exercise prescription for resistance, speed, and METs.;Short Term: Perform resistance training exercises routinely during rehab and add in resistance training at home;Long Term: Improve cardiorespiratory fitness, muscular endurance and strength as measured by increased METs and functional capacity (  6MWT)       Able to understand and use rate of perceived exertion (RPE) scale  Yes       Intervention  Provide education and explanation on how to use RPE scale       Expected Outcomes  Short Term: Able to use RPE daily in rehab to express subjective intensity level;Long Term:  Able to use RPE to guide intensity level when exercising independently       Able to understand and use Dyspnea scale  Yes       Intervention  Provide education and explanation on how to use Dyspnea scale       Expected Outcomes  Short Term: Able to use Dyspnea scale daily in rehab to express subjective sense of shortness of breath during exertion;Long Term: Able to use Dyspnea scale to guide intensity level when exercising independently       Knowledge and understanding of Target Heart  Rate Range (THRR)  Yes       Intervention  Provide education and explanation of THRR including how the numbers were predicted and where they are located for reference       Expected Outcomes  Short Term: Able to state/look up THRR;Short Term: Able to use daily as guideline for intensity in rehab;Long Term: Able to use THRR to govern intensity when exercising independently       Able to check pulse independently  Yes       Intervention  Provide education and demonstration on how to check pulse in carotid and radial arteries.;Review the importance of being able to check your own pulse for safety during independent exercise       Expected Outcomes  Short Term: Able to explain why pulse checking is important during independent exercise;Long Term: Able to check pulse independently and accurately       Understanding of Exercise Prescription  Yes       Intervention  Provide education, explanation, and written materials on patient's individual exercise prescription       Expected Outcomes  Short Term: Able to explain program exercise prescription;Long Term: Able to explain home exercise prescription to exercise independently          Copy of goals given to participant.

## 2017-12-28 NOTE — Progress Notes (Signed)
Cardiac Individual Treatment Plan  Patient Details  Name: Kayla Malone MRN: [id number] Date of Birth: 04-26-1961 Referring Provider:     Cardiac Rehab from 12/28/2017 in [facility] Cardiac and Pulmonary Rehab  Referring Provider  Humphrey Rolls      Initial Encounter Date:    Cardiac Rehab from 12/28/2017 in [facility] Cardiac and Pulmonary Rehab  Date  12/28/17  Referring Provider  Humphrey Rolls      Visit Diagnosis: S/P CABG x 2  Patient's Home Medications on Admission:  Current Outpatient Medications:  .  acetaminophen (TYLENOL) 500 MG tablet, Take 1,000 mg by mouth every 6 (six) hours as needed for mild pain or headache., Disp: , Rfl:  .  albuterol (PROVENTIL HFA;VENTOLIN HFA) 108 (90 Base) MCG/ACT inhaler, Inhale 2 puffs into the lungs every 6 (six) hours as needed for wheezing or shortness of breath. , Disp: , Rfl:  .  aspirin EC 81 MG tablet, Take 81 mg by mouth daily., Disp: , Rfl:  .  atorvastatin (LIPITOR) 40 MG tablet, Take 40 mg by mouth daily., Disp: , Rfl:  .  fluticasone (FLONASE) 50 MCG/ACT nasal spray, Place 2 sprays into both nostrils daily., Disp: , Rfl:  .  Fluticasone-Salmeterol (ADVAIR DISKUS) 500-50 MCG/DOSE AEPB, Inhale 1 puff into the lungs daily as needed (SOB). , Disp: , Rfl:  .  hydrochlorothiazide (MICROZIDE) 12.5 MG capsule, Take 12.5 mg by mouth., Disp: , Rfl:  .  metFORMIN (GLUCOPHAGE) 500 MG tablet, Take 500 mg by mouth 3 (three) times daily. , Disp: , Rfl: 5 .  metoprolol tartrate (LOPRESSOR) 25 MG tablet, Take 1 tablet (25 mg total) 2 (two) times daily by mouth., Disp: 60 tablet, Rfl: 1 .  mirtazapine (REMERON) 15 MG tablet, TAKE 1 TABLET BY MOUTH EVERYDAY AT BEDTIME, Disp: , Rfl: 2 .  pantoprazole (PROTONIX) 40 MG tablet, Take 40 mg by mouth. , Disp: , Rfl:  .  sitaGLIPtin (JANUVIA) 50 MG tablet, Take 50 mg by mouth daily., Disp: , Rfl:  .  Vitamin D, Ergocalciferol, (DRISDOL) 50000 units CAPS capsule, Take 50,000 Units every 7 (seven) days by mouth. tuesday, Disp: , Rfl:   .  VOLTAREN 1 % GEL, APPLY 2 GRAMS TOPICALLY 4 TIMES DAILY AS NEEDED, Disp: , Rfl: 2 .  buPROPion (ZYBAN) 150 MG 12 hr tablet, Take 150 mg by mouth 2 (two) times daily., Disp: , Rfl: 5 .  oxyCODONE (OXY IR/ROXICODONE) 5 MG immediate release tablet, Take 1 tablet (5 mg total) every 4 (four) hours as needed by mouth for severe pain. (Patient not taking: Reported on 12/28/2017), Disp: 30 tablet, Rfl: 0 .  traZODone (DESYREL) 50 MG tablet, Take 50 mg by mouth at bedtime., Disp: , Rfl: 5 .  vitamin B-12 (CYANOCOBALAMIN) 1000 MCG tablet, Take by mouth., Disp: , Rfl:   Past Medical History: Past Medical History:  Diagnosis Date  . Asthma   . Coronary artery disease   . Depression   . Diabetes mellitus without complication ([place])   . GERD (gastroesophageal reflux disease)   . History of hiatal hernia   . Hypertension   . Pain    BACK  . Stroke ([place])    x3  . Thyroid cyst     Tobacco Use: Social History   Tobacco Use  Smoking Status Former Smoker  . Last attempt to quit: 09/13/1998  . Years since quitting: 19.3  Smokeless Tobacco Never Used    Labs: Recent Review [facility] for ITP Cardiac and  Pulmonary Rehab Latest Ref Rng & Units 10/23/2017 10/23/2017 10/23/2017 10/23/2017 10/24/2017   Hemoglobin A1c 4.8 - 5.6 % 7.3(H) - - - -   PHART 7.350 - 7.450 - 7.359 7.402 - -   PCO2ART 32.0 - 48.0 mmHg - 44.0 41.4 - -   HCO3 20.0 - 28.0 mmol/L - 25.1 26.0 - -   TCO2 22 - 32 mmol/L - 26 27 25 28    ACIDBASEDEF 0.0 - 2.0 mmol/L - 1.0 - - -   O2SAT % - 99.0 99.0 - -       Exercise Target Goals: Date: 12/28/17  Exercise Program Goal: Individual exercise prescription set using results from initial 6 min walk test and THRR while considering  patient's activity barriers and safety.   Exercise Prescription Goal: Initial exercise prescription builds to 30-45 minutes a day of aerobic activity, 2-3 days per week.  Home exercise guidelines will be given to patient during program as  part of exercise prescription that the participant will acknowledge.  Activity Barriers & Risk Stratification: Activity Barriers & Cardiac Risk Stratification - 12/28/17 1430      Activity Barriers & Cardiac Risk Stratification   Activity Barriers  Back Problems;Muscular Weakness;Chest Pain/Angina hx of stroke so left sided weakness    Cardiac Risk Stratification  Moderate       6 Minute Walk: 6 Minute Walk    Row Name 12/28/17 1500         6 Minute Walk   Distance  610 feet     Walk Time  6 minutes     # of Rest Breaks  1     MPH  1.15     METS  1.52     RPE  11     Perceived Dyspnea   1     VO2 Peak  5.3     Symptoms  Yes (comment)     Comments  felt like L leg "giving out"  - had stroke     Resting HR  71 bpm     Resting BP  118/82     Resting Oxygen Saturation   99 %     Exercise Oxygen Saturation  during 6 min walk  100 %     Max Ex. HR  101 bpm     Max Ex. BP  150/78     2 Minute Post BP  148/78        Oxygen Initial Assessment:   Oxygen Re-Evaluation:   Oxygen Discharge (Final Oxygen Re-Evaluation):   Initial Exercise Prescription: Initial Exercise Prescription - 12/28/17 1500      Date of Initial Exercise RX and Referring Provider   Date  12/28/17    Referring Provider  Humphrey Rolls      Recumbant Bike   Level  1    RPM  60    Minutes  15    METs  1.5      NuStep   Level  1    SPM  80    Minutes  15    METs  1.5      Biostep-RELP   Level  1    SPM  50    Minutes  15    METs  2      Track   Laps  15    Minutes  15    METs  1.5      Prescription Details   Frequency (times per week)  3    Duration  Progress to 45 minutes of aerobic exercise without signs/symptoms of physical distress      Intensity   THRR 40-80% of Max Heartrate  107-145    Ratings of Perceived Exertion  11-13    Perceived Dyspnea  0-4      Resistance Training   Training Prescription  Yes    Weight  2 lb    Reps  10-15       Perform Capillary Blood Glucose  checks as needed.  Exercise Prescription Changes: Exercise Prescription Changes    Row Name 12/28/17 1400             Response to Exercise   Blood Pressure (Admit)  118/82       Blood Pressure (Exercise)  150/78       Blood Pressure (Exit)  130/78       Heart Rate (Admit)  80 bpm       Heart Rate (Exercise)  87 bpm       Heart Rate (Exit)  68 bpm       Oxygen Saturation (Admit)  99 %       Oxygen Saturation (Exit)  100 %       Rating of Perceived Exertion (Exercise)  11          Exercise Comments:   Exercise Goals and Review: Exercise Goals    Row Name 12/28/17 1459             Exercise Goals   Increase Physical Activity  Yes       Intervention  Provide advice, education, support and counseling about physical activity/exercise needs.;Develop an individualized exercise prescription for aerobic and resistive training based on initial evaluation findings, risk stratification, comorbidities and participant's personal goals.       Expected Outcomes  Short Term: Attend rehab on a regular basis to increase amount of physical activity.;Long Term: Add in home exercise to make exercise part of routine and to increase amount of physical activity.;Long Term: Exercising regularly at least 3-5 days a week.       Increase Strength and Stamina  Yes       Intervention  Provide advice, education, support and counseling about physical activity/exercise needs.;Develop an individualized exercise prescription for aerobic and resistive training based on initial evaluation findings, risk stratification, comorbidities and participant's personal goals.       Expected Outcomes  Short Term: Increase workloads from initial exercise prescription for resistance, speed, and METs.;Short Term: Perform resistance training exercises routinely during rehab and add in resistance training at home;Long Term: Improve cardiorespiratory fitness, muscular endurance and strength as measured by increased METs and  functional capacity (6MWT)       Able to understand and use rate of perceived exertion (RPE) scale  Yes       Intervention  Provide education and explanation on how to use RPE scale       Expected Outcomes  Short Term: Able to use RPE daily in rehab to express subjective intensity level;Long Term:  Able to use RPE to guide intensity level when exercising independently       Able to understand and use Dyspnea scale  Yes       Intervention  Provide education and explanation on how to use Dyspnea scale       Expected Outcomes  Short Term: Able to use Dyspnea scale daily in rehab to express subjective sense of shortness of breath during exertion;Long Term: Able to use Dyspnea scale to guide intensity  level when exercising independently       Knowledge and understanding of Target Heart Rate Range (THRR)  Yes       Intervention  Provide education and explanation of THRR including how the numbers were predicted and where they are located for reference       Expected Outcomes  Short Term: Able to state/look up THRR;Short Term: Able to use daily as guideline for intensity in rehab;Long Term: Able to use THRR to govern intensity when exercising independently       Able to check pulse independently  Yes       Intervention  Provide education and demonstration on how to check pulse in carotid and radial arteries.;Review the importance of being able to check your own pulse for safety during independent exercise       Expected Outcomes  Short Term: Able to explain why pulse checking is important during independent exercise;Long Term: Able to check pulse independently and accurately       Understanding of Exercise Prescription  Yes       Intervention  Provide education, explanation, and written materials on patient's individual exercise prescription       Expected Outcomes  Short Term: Able to explain program exercise prescription;Long Term: Able to explain home exercise prescription to exercise independently           Exercise Goals Re-Evaluation :   Discharge Exercise Prescription (Final Exercise Prescription Changes): Exercise Prescription Changes - 12/28/17 1400      Response to Exercise   Blood Pressure (Admit)  118/82    Blood Pressure (Exercise)  150/78    Blood Pressure (Exit)  130/78    Heart Rate (Admit)  80 bpm    Heart Rate (Exercise)  87 bpm    Heart Rate (Exit)  68 bpm    Oxygen Saturation (Admit)  99 %    Oxygen Saturation (Exit)  100 %    Rating of Perceived Exertion (Exercise)  11       Nutrition:  Target Goals: Understanding of nutrition guidelines, daily intake of sodium '1500mg'$ , cholesterol '200mg'$ , calories 30% from fat and 7% or less from saturated fats, daily to have 5 or more servings of fruits and vegetables.  Biometrics: Pre Biometrics - 12/28/17 1458      Pre Biometrics   Height  5' 4.5" (1.638 m)    Weight  149 lb 1.6 oz (67.6 kg)    Waist Circumference  34.5 inches    Hip Circumference  39.5 inches    Waist to Hip Ratio  0.87 %    BMI (Calculated)  25.21        Nutrition Therapy Plan and Nutrition Goals:   Nutrition Assessments: Nutrition Assessments - 12/28/17 1410      MEDFICTS Scores   Pre Score  -- Will fill out when appetite returns       Nutrition Goals Re-Evaluation:   Nutrition Goals Discharge (Final Nutrition Goals Re-Evaluation):   Psychosocial: Target Goals: Acknowledge presence or absence of significant depression and/or stress, maximize coping skills, provide positive support system. Participant is able to verbalize types and ability to use techniques and skills needed for reducing stress and depression.   Initial Review & Psychosocial Screening: Initial Psych Review & Screening - 12/28/17 1425      Initial Review   Current issues with  History of Depression;Current Stress Concerns    Source of Stress Concerns  Poor Coping Skills;Unable to participate in former interests or hobbies  Comments  Kayla Malone has little drive to do  much around the house or to get healthier.       Family Dynamics   Good Support System?  No one daughter did accompany her to Med Review and also cooks some for her, but Ishika states she doesn't feel she has a good support system    Strains  Intra-family strains      Screening Interventions   Interventions  Encouraged to exercise;Program counselor consult;To provide support and resources with identified psychosocial needs    Expected Outcomes  Short Term goal: Utilizing psychosocial counselor, staff and physician to assist with identification of specific Stressors or current issues interfering with healing process. Setting desired goal for each stressor or current issue identified.;Long Term Goal: Stressors or current issues are controlled or eliminated.;Short Term goal: Identification and review with participant of any Quality of Life or Depression concerns found by scoring the questionnaire.;Long Term goal: The participant improves quality of Life and PHQ9 Scores as seen by post scores and/or verbalization of changes       Quality of Life Scores:  Quality of Life - 12/28/17 1428      Quality of Life Scores   Health/Function Pre  20.2 %    Socioeconomic Pre  19.43 %    Psych/Spiritual Pre  20.86 %    Family Pre  20.6 %    GLOBAL Pre  20.24 %      Scores of 19 and below usually indicate a poorer quality of life in these areas.  A difference of  2-3 points is a clinically meaningful difference.  A difference of 2-3 points in the total score of the Quality of Life Index has been associated with significant improvement in overall quality of life, self-image, physical symptoms, and general health in studies assessing change in quality of life.  PHQ-9: Recent Review Flowsheet Data    Depression screen [facility] 2/9 12/28/2017   Decreased Interest 2   Down, Depressed, Hopeless 2   PHQ - 2 Score 4   Altered sleeping 2   Tired, decreased energy 2   Change in appetite 3   Feeling bad or failure  about yourself  0   Trouble concentrating 1   Moving slowly or fidgety/restless 0   Suicidal thoughts 0   PHQ-9 Score 12   Difficult doing work/chores Not difficult at all     Interpretation of Total Score  Total Score Depression Severity:  1-4 = Minimal depression, 5-9 = Mild depression, 10-14 = Moderate depression, 15-19 = Moderately severe depression, 20-27 = Severe depression   Psychosocial Evaluation and Intervention:   Psychosocial Re-Evaluation:   Psychosocial Discharge (Final Psychosocial Re-Evaluation):   Vocational Rehabilitation: Provide vocational rehab assistance to qualifying candidates.   Vocational Rehab Evaluation & Intervention: Vocational Rehab - 12/28/17 1429      Initial Vocational Rehab Evaluation & Intervention   Assessment shows need for Vocational Rehabilitation  No       Education: Education Goals: Education classes will be provided on a variety of topics geared toward better understanding of heart health and risk factor modification. Participant will state understanding/return demonstration of topics presented as noted by education test scores.  Learning Barriers/Preferences: Learning Barriers/Preferences - 12/28/17 1429      Learning Barriers/Preferences   Learning Barriers  None    Learning Preferences  Audio;Video       Education Topics: General Nutrition Guidelines/Fats and Fiber: -Group instruction provided by verbal, written material, models and posters to present  the general guidelines for heart healthy nutrition. Gives an explanation and review of dietary fats and fiber.   Controlling Sodium/Reading Food Labels: -Group verbal and written material supporting the discussion of sodium use in heart healthy nutrition. Review and explanation with models, verbal and written materials for utilization of the food label.   Exercise Physiology & Risk Factors: - Group verbal and written instruction with models to review the exercise  physiology of the cardiovascular system and associated critical values. Details cardiovascular disease risk factors and the goals associated with each risk factor.   Aerobic Exercise & Resistance Training: - Gives group verbal and written discussion on the health impact of inactivity. On the components of aerobic and resistive training programs and the benefits of this training and how to safely progress through these programs.   Flexibility, Balance, General Exercise Guidelines: - Provides group verbal and written instruction on the benefits of flexibility and balance training programs. Provides general exercise guidelines with specific guidelines to those with heart or lung disease. Demonstration and skill practice provided.   Stress Management: - Provides group verbal and written instruction about the health risks of elevated stress, cause of high stress, and healthy ways to reduce stress.   Depression: - Provides group verbal and written instruction on the correlation between heart/lung disease and depressed mood, treatment options, and the stigmas associated with seeking treatment.   Anatomy & Physiology of the Heart: - Group verbal and written instruction and models provide basic cardiac anatomy and physiology, with the coronary electrical and arterial systems. Review of: AMI, Angina, Valve disease, Heart Failure, Cardiac Arrhythmia, Pacemakers, and the ICD.   Cardiac Procedures: - Group verbal and written instruction to review commonly prescribed medications for heart disease. Reviews the medication, class of the drug, and side effects. Includes the steps to properly store meds and maintain the prescription regimen. (beta blockers and nitrates)   Cardiac Medications I: - Group verbal and written instruction to review commonly prescribed medications for heart disease. Reviews the medication, class of the drug, and side effects. Includes the steps to properly store meds and maintain  the prescription regimen.   Cardiac Medications II: -Group verbal and written instruction to review commonly prescribed medications for heart disease. Reviews the medication, class of the drug, and side effects. (all other drug classes)    Go Sex-Intimacy & Heart Disease, Get SMART - Goal Setting: - Group verbal and written instruction through game format to discuss heart disease and the return to sexual intimacy. Provides group verbal and written material to discuss and apply goal setting through the application of the S.M.A.R.T. Method.   Other Matters of the Heart: - Provides group verbal, written materials and models to describe Heart Failure, Angina, Valve Disease, Peripheral Artery Disease, and Diabetes in the realm of heart disease. Includes description of the disease process and treatment options available to the cardiac patient.   Exercise & Equipment Safety: - Individual verbal instruction and demonstration of equipment use and safety with use of the equipment.   Cardiac Rehab from 12/28/2017 in [facility] Cardiac and Pulmonary Rehab  Date  12/28/17  Educator  [facility]  Instruction Review Code  1- Verbalizes Understanding      Infection Prevention: - Provides verbal and written material to individual with discussion of infection control including proper hand washing and proper equipment cleaning during exercise session.   Cardiac Rehab from 12/28/2017 in [facility] Cardiac and Pulmonary Rehab  Date  12/28/17  Educator  [facility]  Instruction Review Code  1- Verbalizes Understanding      Falls Prevention: - Provides verbal and written material to individual with discussion of falls prevention and safety.   Cardiac Rehab from 12/28/2017 in [facility] Cardiac and Pulmonary Rehab  Date  12/28/17  Educator  [facility]  Instruction Review Code  1- Verbalizes Understanding      Diabetes: - Individual verbal and written instruction to review signs/symptoms of diabetes, desired ranges of glucose level fasting,  after meals and with exercise. Acknowledge that pre and post exercise glucose checks will be done for 3 sessions at entry of program.   Cardiac Rehab from 12/28/2017 in [facility] Cardiac and Pulmonary Rehab  Date  12/28/17  Educator  [facility]  Instruction Review Code  1- Verbalizes Understanding      Other: -Provides group and verbal instruction on various topics (see comments)    Knowledge Questionnaire Score: Knowledge Questionnaire Score - 12/28/17 1429      Knowledge Questionnaire Score   Pre Score  19/28 correct answers reviewed with [facility]       Core Components/Risk Factors/Patient Goals at Admission: Personal Goals and Risk Factors at Admission - 12/28/17 1351      Core Components/Risk Factors/Patient Goals on Admission    Weight Management  Yes;Weight Maintenance    Intervention  Weight Management: Develop a combined nutrition and exercise program designed to reach desired caloric intake, while maintaining appropriate intake of nutrient and fiber, sodium and fats, and appropriate energy expenditure required for the weight goal.;Weight Management: Provide education and appropriate resources to help participant work on and attain dietary goals.    Admit Weight  135 lb (61.2 kg) Aleisha reports having a no desire to eat and has been losing weight without trying    Expected Outcomes  Short Term: Continue to assess and modify interventions until short term weight is achieved;Long Term: Adherence to nutrition and physical activity/exercise program aimed toward attainment of established weight goal;Weight Maintenance: Understanding of the daily nutrition guidelines, which includes 25-35% calories from fat, 7% or less cal from saturated fats, less than 200mg  cholesterol, less than 1.5gm of sodium, & 5 or more servings of fruits and vegetables daily;Understanding recommendations for meals to include 15-35% energy as protein, 25-35% energy from fat, 35-60% energy from carbohydrates, less than 200mg  of  dietary cholesterol, 20-35 gm of total fiber daily;Understanding of distribution of calorie intake throughout the day with the consumption of 4-5 meals/snacks    Diabetes  Yes    Intervention  Provide education about signs/symptoms and action to take for hypo/hyperglycemia.;Provide education about proper nutrition, including hydration, and aerobic/resistive exercise prescription along with prescribed medications to achieve blood glucose in normal ranges: Fasting glucose 65-99 mg/dL    Expected Outcomes  Long Term: Attainment of HbA1C < 7%.;Short Term: Participant verbalizes understanding of the signs/symptoms and immediate care of hyper/hypoglycemia, proper foot care and importance of medication, aerobic/resistive exercise and nutrition plan for blood glucose control.    Hypertension  Yes    Intervention  Provide education on lifestyle modifcations including regular physical activity/exercise, weight management, moderate sodium restriction and increased consumption of fresh fruit, vegetables, and low fat dairy, alcohol moderation, and smoking cessation.;Monitor prescription use compliance.    Expected Outcomes  Short Term: Continued assessment and intervention until BP is < 140/26mm HG in hypertensive participants. < 130/58mm HG in hypertensive participants with diabetes, heart failure or chronic kidney disease.;Long Term: Maintenance of blood pressure at goal levels.    Lipids  Yes    Intervention  Provide  education and support for participant on nutrition & aerobic/resistive exercise along with prescribed medications to achieve LDL 70mg , HDL >40mg .    Expected Outcomes  Short Term: Participant states understanding of desired cholesterol values and is compliant with medications prescribed. Participant is following exercise prescription and nutrition guidelines.;Long Term: Cholesterol controlled with medications as prescribed, with individualized exercise RX and with personalized nutrition plan. Value  goals: LDL < 70mg , HDL > 40 mg.       Core Components/Risk Factors/Patient Goals Review:    Core Components/Risk Factors/Patient Goals at Discharge (Final Review):    ITP Comments: ITP Comments    Row Name 12/28/17 1224           ITP Comments  Med Review completed. Initial ITP created. Diagnosis can be found in [facility] 10/26/17          Comments: Initial ITP

## 2017-12-28 NOTE — Progress Notes (Signed)
Daily Session Note  Patient Details  Name: Kayla Malone MRN: [id number] Date of Birth: 12-19-1960 Referring Provider:     Cardiac Rehab from 12/28/2017 in [facility]  Referring Provider  [name] Date: 12/28/2017  Check In: Session Check In - 12/28/17 1224      Check-In   Location  [facility]-Cardiac [facility] Pul[facility]    Staff Present  Renita Papa, RN Vickki Hearing, [place], ACSM CEP, Exercise Physiologist    Supervising physician immediately available to respond to emergencies  See telemetry face sheet for immediately available ER MD    Medication changes reported      No    Fall or balance concerns reported     No    Tobacco Cessation  No Change quit in 2000    Warm-up and Cool-down  Performed as group-led instruction    Resistance Training Performed  Yes    VAD Patient?  No      Pain Assessment   Currently in Pain?  No/denies        Exercise Prescription Changes - 12/28/17 1400      Response to Exercise   Blood Pressure (Admit)  118/82    Blood Pressure (Exercise)  150/78    Blood Pressure (Exit)  130/78    Heart Rate (Admit)  80 bpm    Heart Rate (Exercise)  87 bpm    Heart Rate (Exit)  68 bpm    Oxygen Saturation (Admit)  99 %    Oxygen Saturation (Exit)  100 %    Rating of Perceived Exertion (Exercise)  11       Social History   Tobacco Use  Smoking Status Former Smoker  . Last attempt to quit: 09/13/1998  . Years since quitting: 19.3  Smokeless Tobacco Never Used    Goals Met:  Proper associated with RPD/PD & O2 Sat Exercise tolerated well Personal goals reviewed No report of cardiac concerns or symptoms Strength training completed today  Goals Unmet:  Not Applicable  Comments: Med Review completed   Dr. Emily Filbert is Medical Director for [place] and [name].

## 2018-01-08 ENCOUNTER — Ambulatory Visit: Payer: Medicare Other

## 2018-01-10 ENCOUNTER — Encounter: Payer: Self-pay | Admitting: *Deleted

## 2018-01-10 DIAGNOSIS — Z951 Presence of aortocoronary bypass graft: Secondary | ICD-10-CM

## 2018-01-10 NOTE — Progress Notes (Signed)
Cardiac Individual Treatment Plan  Patient Details  Name: Kayla Malone MRN: [id number] Date of Birth: 06-19-1961 Referring Provider:     Cardiac Rehab from 12/28/2017 in [facility] Cardiac and Pulmonary Rehab  Referring Provider  Humphrey Rolls      Initial Encounter Date:    Cardiac Rehab from 12/28/2017 in [facility] Cardiac and Pulmonary Rehab  Date  12/28/17  Referring Provider  Humphrey Rolls      Visit Diagnosis: S/P CABG x 2  Patient's Home Medications on Admission:  Current Outpatient Medications:  .  acetaminophen (TYLENOL) 500 MG tablet, Take 1,000 mg by mouth every 6 (six) hours as needed for mild pain or headache., Disp: , Rfl:  .  albuterol (PROVENTIL HFA;VENTOLIN HFA) 108 (90 Base) MCG/ACT inhaler, Inhale 2 puffs into the lungs every 6 (six) hours as needed for wheezing or shortness of breath. , Disp: , Rfl:  .  aspirin EC 81 MG tablet, Take 81 mg by mouth daily., Disp: , Rfl:  .  atorvastatin (LIPITOR) 40 MG tablet, Take 40 mg by mouth daily., Disp: , Rfl:  .  buPROPion (ZYBAN) 150 MG 12 hr tablet, Take 150 mg by mouth 2 (two) times daily., Disp: , Rfl: 5 .  fluticasone (FLONASE) 50 MCG/ACT nasal spray, Place 2 sprays into both nostrils daily., Disp: , Rfl:  .  Fluticasone-Salmeterol (ADVAIR DISKUS) 500-50 MCG/DOSE AEPB, Inhale 1 puff into the lungs daily as needed (SOB). , Disp: , Rfl:  .  hydrochlorothiazide (MICROZIDE) 12.5 MG capsule, Take 12.5 mg by mouth., Disp: , Rfl:  .  metFORMIN (GLUCOPHAGE) 500 MG tablet, Take 500 mg by mouth 3 (three) times daily. , Disp: , Rfl: 5 .  metoprolol tartrate (LOPRESSOR) 25 MG tablet, Take 1 tablet (25 mg total) 2 (two) times daily by mouth., Disp: 60 tablet, Rfl: 1 .  mirtazapine (REMERON) 15 MG tablet, TAKE 1 TABLET BY MOUTH EVERYDAY AT BEDTIME, Disp: , Rfl: 2 .  oxyCODONE (OXY IR/ROXICODONE) 5 MG immediate release tablet, Take 1 tablet (5 mg total) every 4 (four) hours as needed by mouth for severe pain. (Patient not taking: Reported on 12/28/2017),  Disp: 30 tablet, Rfl: 0 .  pantoprazole (PROTONIX) 40 MG tablet, Take 40 mg by mouth. , Disp: , Rfl:  .  sitaGLIPtin (JANUVIA) 50 MG tablet, Take 50 mg by mouth daily., Disp: , Rfl:  .  traZODone (DESYREL) 50 MG tablet, Take 50 mg by mouth at bedtime., Disp: , Rfl: 5 .  vitamin B-12 (CYANOCOBALAMIN) 1000 MCG tablet, Take by mouth., Disp: , Rfl:  .  Vitamin D, Ergocalciferol, (DRISDOL) 50000 units CAPS capsule, Take 50,000 Units every 7 (seven) days by mouth. tuesday, Disp: , Rfl:  .  VOLTAREN 1 % GEL, APPLY 2 GRAMS TOPICALLY 4 TIMES DAILY AS NEEDED, Disp: , Rfl: 2  Past Medical History: Past Medical History:  Diagnosis Date  . Asthma   . Coronary artery disease   . Depression   . Diabetes mellitus without complication ([place])   . GERD (gastroesophageal reflux disease)   . History of hiatal hernia   . Hypertension   . Pain    BACK  . Stroke ([place])    x3  . Thyroid cyst     Tobacco Use: Social History   Tobacco Use  Smoking Status Former Smoker  . Last attempt to quit: 09/13/1998  . Years since quitting: 19.3  Smokeless Tobacco Never Used    Labs: Recent Review [facility] for [facility] Cardiac and  Pulmonary Rehab Latest Ref Rng & Units 10/23/2017 10/23/2017 10/23/2017 10/23/2017 10/24/2017   Hemoglobin A1c 4.8 - 5.6 % 7.3(H) - - - -   PHART 7.350 - 7.450 - 7.359 7.402 - -   PCO2ART 32.0 - 48.0 mmHg - 44.0 41.4 - -   HCO3 20.0 - 28.0 mmol/L - 25.1 26.0 - -   TCO2 22 - 32 mmol/L - _0 ACIDBASEDEF 0.0 - 2.0 mmol/L - 1.0 - - -   O2SAT % - 99.0 99.0 - -       Exercise Target Goals:    Exercise Program Goal: Individual exercise prescription set using results from initial 6 min walk test and THRR while considering  patient's activity barriers and safety.   Exercise Prescription Goal: Initial exercise prescription builds to 30-45 minutes a day of aerobic activity, 2-3 days per week.  Home exercise guidelines will be given to patient during program as part of  exercise prescription that the participant will acknowledge.  Activity Barriers & Risk Stratification: Activity Barriers & Cardiac Risk Stratification - 12/28/17 1430      Activity Barriers & Cardiac Risk Stratification   Activity Barriers  Back Problems;Muscular Weakness;Chest Pain/Angina hx of stroke so left sided weakness    Cardiac Risk Stratification  Moderate       6 Minute Walk: 6 Minute Walk    Row Name 12/28/17 1500         6 Minute Walk   Distance  610 feet     Walk Time  6 minutes     # of Rest Breaks  1     MPH  1.15     METS  1.52     RPE  11     Perceived Dyspnea   1     VO2 Peak  5.3     Symptoms  Yes (comment)     Comments  felt like L leg "giving out"  - had stroke     Resting HR  71 bpm     Resting BP  118/82     Resting Oxygen Saturation   99 %     Exercise Oxygen Saturation  during 6 min walk  100 %     Max Ex. HR  101 bpm     Max Ex. BP  150/78     2 Minute Post BP  148/78        Oxygen Initial Assessment:   Oxygen Re-Evaluation:   Oxygen Discharge (Final Oxygen Re-Evaluation):   Initial Exercise Prescription: Initial Exercise Prescription - 12/28/17 1500      Date of Initial Exercise RX and Referring Provider   Date  12/28/17    Referring Provider  Humphrey Rolls      Recumbant Bike   Level  1    RPM  60    Minutes  15    METs  1.5      NuStep   Level  1    SPM  80    Minutes  15    METs  1.5      Biostep-RELP   Level  1    SPM  50    Minutes  15    METs  2      Track   Laps  15    Minutes  15    METs  1.5      Prescription Details   Frequency (times per week)  3    Duration  Progress to 45 minutes of aerobic exercise without signs/symptoms of physical distress      Intensity   THRR 40-80% of Max Heartrate  107-145    Ratings of Perceived Exertion  11-13    Perceived Dyspnea  0-4      Resistance Training   Training Prescription  Yes    Weight  2 lb    Reps  10-15       Perform Capillary Blood Glucose checks as  needed.  Exercise Prescription Changes: Exercise Prescription Changes    Row Name 12/28/17 1400             Response to Exercise   Blood Pressure (Admit)  118/82       Blood Pressure (Exercise)  150/78       Blood Pressure (Exit)  130/78       Heart Rate (Admit)  80 bpm       Heart Rate (Exercise)  87 bpm       Heart Rate (Exit)  68 bpm       Oxygen Saturation (Admit)  99 %       Oxygen Saturation (Exit)  100 %       Rating of Perceived Exertion (Exercise)  11          Exercise Comments:   Exercise Goals and Review: Exercise Goals    Row Name 12/28/17 1459             Exercise Goals   Increase Physical Activity  Yes       Intervention  Provide advice, education, support and counseling about physical activity/exercise needs.;Develop an individualized exercise prescription for aerobic and resistive training based on initial evaluation findings, risk stratification, comorbidities and participant's personal goals.       Expected Outcomes  Short Term: Attend rehab on a regular basis to increase amount of physical activity.;Long Term: Add in home exercise to make exercise part of routine and to increase amount of physical activity.;Long Term: Exercising regularly at least 3-5 days a week.       Increase Strength and Stamina  Yes       Intervention  Provide advice, education, support and counseling about physical activity/exercise needs.;Develop an individualized exercise prescription for aerobic and resistive training based on initial evaluation findings, risk stratification, comorbidities and participant's personal goals.       Expected Outcomes  Short Term: Increase workloads from initial exercise prescription for resistance, speed, and METs.;Short Term: Perform resistance training exercises routinely during rehab and add in resistance training at home;Long Term: Improve cardiorespiratory fitness, muscular endurance and strength as measured by increased METs and functional  capacity (6MWT)       Able to understand and use rate of perceived exertion (RPE) scale  Yes       Intervention  Provide education and explanation on how to use RPE scale       Expected Outcomes  Short Term: Able to use RPE daily in rehab to express subjective intensity level;Long Term:  Able to use RPE to guide intensity level when exercising independently       Able to understand and use Dyspnea scale  Yes       Intervention  Provide education and explanation on how to use Dyspnea scale       Expected Outcomes  Short Term: Able to use Dyspnea scale daily in rehab to express subjective sense of shortness of breath during exertion;Long Term: Able to use Dyspnea scale to guide intensity  level when exercising independently       Knowledge and understanding of Target Heart Rate Range (THRR)  Yes       Intervention  Provide education and explanation of THRR including how the numbers were predicted and where they are located for reference       Expected Outcomes  Short Term: Able to state/look up THRR;Short Term: Able to use daily as guideline for intensity in rehab;Long Term: Able to use THRR to govern intensity when exercising independently       Able to check pulse independently  Yes       Intervention  Provide education and demonstration on how to check pulse in carotid and radial arteries.;Review the importance of being able to check your own pulse for safety during independent exercise       Expected Outcomes  Short Term: Able to explain why pulse checking is important during independent exercise;Long Term: Able to check pulse independently and accurately       Understanding of Exercise Prescription  Yes       Intervention  Provide education, explanation, and written materials on patient's individual exercise prescription       Expected Outcomes  Short Term: Able to explain program exercise prescription;Long Term: Able to explain home exercise prescription to exercise independently           Exercise Goals Re-Evaluation :   Discharge Exercise Prescription (Final Exercise Prescription Changes): Exercise Prescription Changes - 12/28/17 1400      Response to Exercise   Blood Pressure (Admit)  118/82    Blood Pressure (Exercise)  150/78    Blood Pressure (Exit)  130/78    Heart Rate (Admit)  80 bpm    Heart Rate (Exercise)  87 bpm    Heart Rate (Exit)  68 bpm    Oxygen Saturation (Admit)  99 %    Oxygen Saturation (Exit)  100 %    Rating of Perceived Exertion (Exercise)  11       Nutrition:  Target Goals: Understanding of nutrition guidelines, daily intake of sodium '1500mg'$ , cholesterol '200mg'$ , calories 30% from fat and 7% or less from saturated fats, daily to have 5 or more servings of fruits and vegetables.  Biometrics: Pre Biometrics - 12/28/17 1458      Pre Biometrics   Height  5' 4.5" (1.638 m)    Weight  149 lb 1.6 oz (67.6 kg)    Waist Circumference  34.5 inches    Hip Circumference  39.5 inches    Waist to Hip Ratio  0.87 %    BMI (Calculated)  25.21        Nutrition Therapy Plan and Nutrition Goals:   Nutrition Assessments: Nutrition Assessments - 12/28/17 1410      MEDFICTS Scores   Pre Score  -- Will fill out when appetite returns       Nutrition Goals Re-Evaluation:   Nutrition Goals Discharge (Final Nutrition Goals Re-Evaluation):   Psychosocial: Target Goals: Acknowledge presence or absence of significant depression and/or stress, maximize coping skills, provide positive support system. Participant is able to verbalize types and ability to use techniques and skills needed for reducing stress and depression.   Initial Review & Psychosocial Screening: Initial Psych Review & Screening - 12/28/17 1425      Initial Review   Current issues with  History of Depression;Current Stress Concerns    Source of Stress Concerns  Poor Coping Skills;Unable to participate in former interests or hobbies  Comments  Azaylah has little drive to do  much around the house or to get healthier.       Family Dynamics   Good Support System?  No one daughter did accompany her to Med Review and also cooks some for her, but Harlo states she doesn't feel she has a good support system    Strains  Intra-family strains      Screening Interventions   Interventions  Encouraged to exercise;Program counselor consult;To provide support and resources with identified psychosocial needs    Expected Outcomes  Short Term goal: Utilizing psychosocial counselor, staff and physician to assist with identification of specific Stressors or current issues interfering with healing process. Setting desired goal for each stressor or current issue identified.;Long Term Goal: Stressors or current issues are controlled or eliminated.;Short Term goal: Identification and review with participant of any Quality of Life or Depression concerns found by scoring the questionnaire.;Long Term goal: The participant improves quality of Life and PHQ9 Scores as seen by post scores and/or verbalization of changes       Quality of Life Scores:  Quality of Life - 12/28/17 1428      Quality of Life Scores   Health/Function Pre  20.2 %    Socioeconomic Pre  19.43 %    Psych/Spiritual Pre  20.86 %    Family Pre  20.6 %    GLOBAL Pre  20.24 %      Scores of 19 and below usually indicate a poorer quality of life in these areas.  A difference of  2-3 points is a clinically meaningful difference.  A difference of 2-3 points in the total score of the Quality of Life Index has been associated with significant improvement in overall quality of life, self-image, physical symptoms, and general health in studies assessing change in quality of life.  PHQ-9: Recent Review Flowsheet Data    Depression screen [facility] 2/9 12/28/2017   Decreased Interest 2   Down, Depressed, Hopeless 2   PHQ - 2 Score 4   Altered sleeping 2   Tired, decreased energy 2   Change in appetite 3   Feeling bad or failure  about yourself  0   Trouble concentrating 1   Moving slowly or fidgety/restless 0   Suicidal thoughts 0   PHQ-9 Score 12   Difficult doing work/chores Not difficult at all     Interpretation of Total Score  Total Score Depression Severity:  1-4 = Minimal depression, 5-9 = Mild depression, 10-14 = Moderate depression, 15-19 = Moderately severe depression, 20-27 = Severe depression   Psychosocial Evaluation and Intervention:   Psychosocial Re-Evaluation:   Psychosocial Discharge (Final Psychosocial Re-Evaluation):   Vocational Rehabilitation: Provide vocational rehab assistance to qualifying candidates.   Vocational Rehab Evaluation & Intervention: Vocational Rehab - 12/28/17 1429      Initial Vocational Rehab Evaluation & Intervention   Assessment shows need for Vocational Rehabilitation  No       Education: Education Goals: Education classes will be provided on a variety of topics geared toward better understanding of heart health and risk factor modification. Participant will state understanding/return demonstration of topics presented as noted by education test scores.  Learning Barriers/Preferences: Learning Barriers/Preferences - 12/28/17 1429      Learning Barriers/Preferences   Learning Barriers  None    Learning Preferences  Audio;Video       Education Topics:  AED/CPR: - Group verbal and written instruction with the use of models to demonstrate the basic  use of the AED with the basic ABC's of resuscitation.   General Nutrition Guidelines/Fats and Fiber: -Group instruction provided by verbal, written material, models and posters to present the general guidelines for heart healthy nutrition. Gives an explanation and review of dietary fats and fiber.   Controlling Sodium/Reading Food Labels: -Group verbal and written material supporting the discussion of sodium use in heart healthy nutrition. Review and explanation with models, verbal and written materials  for utilization of the food label.   Exercise Physiology & General Exercise Guidelines: - Group verbal and written instruction with models to review the exercise physiology of the cardiovascular system and associated critical values. Provides general exercise guidelines with specific guidelines to those with heart or lung disease.    Aerobic Exercise & Resistance Training: - Gives group verbal and written instruction on the various components of exercise. Focuses on aerobic and resistive training programs and the benefits of this training and how to safely progress through these programs..   Flexibility, Balance, Mind/Body Relaxation: Provides group verbal/written instruction on the benefits of flexibility and balance training, including mind/body exercise modes such as yoga, pilates and tai chi.  Demonstration and skill practice provided.   Stress and Anxiety: - Provides group verbal and written instruction about the health risks of elevated stress and causes of high stress.  Discuss the correlation between heart/lung disease and anxiety and treatment options. Review healthy ways to manage with stress and anxiety.   Depression: - Provides group verbal and written instruction on the correlation between heart/lung disease and depressed mood, treatment options, and the stigmas associated with seeking treatment.   Anatomy & Physiology of the Heart: - Group verbal and written instruction and models provide basic cardiac anatomy and physiology, with the coronary electrical and arterial systems. Review of Valvular disease and Heart Failure   Cardiac Procedures: - Group verbal and written instruction to review commonly prescribed medications for heart disease. Reviews the medication, class of the drug, and side effects. Includes the steps to properly store meds and maintain the prescription regimen. (beta blockers and nitrates)   Cardiac Medications I: - Group verbal and written instruction  to review commonly prescribed medications for heart disease. Reviews the medication, class of the drug, and side effects. Includes the steps to properly store meds and maintain the prescription regimen.   Cardiac Medications II: -Group verbal and written instruction to review commonly prescribed medications for heart disease. Reviews the medication, class of the drug, and side effects. (all other drug classes)    Go Sex-Intimacy & Heart Disease, Get SMART - Goal Setting: - Group verbal and written instruction through game format to discuss heart disease and the return to sexual intimacy. Provides group verbal and written material to discuss and apply goal setting through the application of the S.M.A.R.T. Method.   Other Matters of the Heart: - Provides group verbal, written materials and models to describe Stable Angina and Peripheral Artery. Includes description of the disease process and treatment options available to the cardiac patient.   Exercise & Equipment Safety: - Individual verbal instruction and demonstration of equipment use and safety with use of the equipment.   Cardiac Rehab from 12/28/2017 in [facility] Cardiac and Pulmonary Rehab  Date  12/28/17  Educator  [facility]  Instruction Review Code  1- Verbalizes Understanding      Infection Prevention: - Provides verbal and written material to individual with discussion of infection control including proper hand washing and proper equipment cleaning during exercise session.  Cardiac Rehab from 12/28/2017 in [facility] Cardiac and Pulmonary Rehab  Date  12/28/17  Educator  [facility]  Instruction Review Code  1- Verbalizes Understanding      Falls Prevention: - Provides verbal and written material to individual with discussion of falls prevention and safety.   Cardiac Rehab from 12/28/2017 in [facility] Cardiac and Pulmonary Rehab  Date  12/28/17  Educator  [facility]  Instruction Review Code  1- Verbalizes Understanding      Diabetes: - Individual verbal  and written instruction to review signs/symptoms of diabetes, desired ranges of glucose level fasting, after meals and with exercise. Acknowledge that pre and post exercise glucose checks will be done for 3 sessions at entry of program.   Cardiac Rehab from 12/28/2017 in [facility] Cardiac and Pulmonary Rehab  Date  12/28/17  Educator  [facility]  Instruction Review Code  1- Verbalizes Understanding      Know Your Numbers and Risk Factors: -Group verbal and written instruction about important numbers in your health.  Discussion of what are risk factors and how they play a role in the disease process.  Review of Cholesterol, Blood Pressure, Diabetes, and BMI and the role they play in your overall health.   Sleep Hygiene: -Provides group verbal and written instruction about how sleep can affect your health.  Define sleep hygiene, discuss sleep cycles and impact of sleep habits. Review good sleep hygiene tips.    Other: -Provides group and verbal instruction on various topics (see comments)   Knowledge Questionnaire Score: Knowledge Questionnaire Score - 12/28/17 1429      Knowledge Questionnaire Score   Pre Score  19/28 correct answers reviewed with [facility]       Core Components/Risk Factors/Patient Goals at Admission: Personal Goals and Risk Factors at Admission - 12/28/17 1351      Core Components/Risk Factors/Patient Goals on Admission    Weight Management  Yes;Weight Maintenance    Intervention  Weight Management: Develop a combined nutrition and exercise program designed to reach desired caloric intake, while maintaining appropriate intake of nutrient and fiber, sodium and fats, and appropriate energy expenditure required for the weight goal.;Weight Management: Provide education and appropriate resources to help participant work on and attain dietary goals.    Admit Weight  135 lb (61.2 kg) Mayleen reports having a no desire to eat and has been losing weight without trying    Expected Outcomes   Short Term: Continue to assess and modify interventions until short term weight is achieved;Long Term: Adherence to nutrition and physical activity/exercise program aimed toward attainment of established weight goal;Weight Maintenance: Understanding of the daily nutrition guidelines, which includes 25-35% calories from fat, 7% or less cal from saturated fats, less than '200mg'$  cholesterol, less than 1.5gm of sodium, & 5 or more servings of fruits and vegetables daily;Understanding recommendations for meals to include 15-35% energy as protein, 25-35% energy from fat, 35-60% energy from carbohydrates, less than '200mg'$  of dietary cholesterol, 20-35 gm of total fiber daily;Understanding of distribution of calorie intake throughout the day with the consumption of 4-5 meals/snacks    Diabetes  Yes    Intervention  Provide education about signs/symptoms and action to take for hypo/hyperglycemia.;Provide education about proper nutrition, including hydration, and aerobic/resistive exercise prescription along with prescribed medications to achieve blood glucose in normal ranges: Fasting glucose 65-99 mg/dL    Expected Outcomes  Long Term: Attainment of HbA1C < 7%.;Short Term: Participant verbalizes understanding of the signs/symptoms and immediate care of hyper/hypoglycemia, proper foot care and  importance of medication, aerobic/resistive exercise and nutrition plan for blood glucose control.    Hypertension  Yes    Intervention  Provide education on lifestyle modifcations including regular physical activity/exercise, weight management, moderate sodium restriction and increased consumption of fresh fruit, vegetables, and low fat dairy, alcohol moderation, and smoking cessation.;Monitor prescription use compliance.    Expected Outcomes  Short Term: Continued assessment and intervention until BP is < 140/29m HG in hypertensive participants. < 130/884mHG in hypertensive participants with diabetes, heart failure or chronic  kidney disease.;Long Term: Maintenance of blood pressure at goal levels.    Lipids  Yes    Intervention  Provide education and support for participant on nutrition & aerobic/resistive exercise along with prescribed medications to achieve LDL '70mg'$ , HDL >'40mg'$ .    Expected Outcomes  Short Term: Participant states understanding of desired cholesterol values and is compliant with medications prescribed. Participant is following exercise prescription and nutrition guidelines.;Long Term: Cholesterol controlled with medications as prescribed, with individualized exercise RX and with personalized nutrition plan. Value goals: LDL < '70mg'$ , HDL > 40 mg.       Core Components/Risk Factors/Patient Goals Review:    Core Components/Risk Factors/Patient Goals at Discharge (Final Review):    ITP Comments: ITP Comments    Row Name 12/28/17 1224 01/10/18 0606         ITP Comments  Med Review completed. Initial ITP created. [facility]. Continue with ITP unless directed changes per Medical Director review.   Sessions start 2/5         Comments:

## 2018-01-16 ENCOUNTER — Encounter: Payer: Medicare Other | Attending: Cardiovascular Disease

## 2018-01-16 ENCOUNTER — Encounter: Payer: Self-pay | Admitting: *Deleted

## 2018-01-16 ENCOUNTER — Telehealth: Payer: Self-pay | Admitting: *Deleted

## 2018-01-16 DIAGNOSIS — Z951 Presence of aortocoronary bypass graft: Secondary | ICD-10-CM

## 2018-01-16 DIAGNOSIS — Z87891 Personal history of nicotine dependence: Secondary | ICD-10-CM | POA: Insufficient documentation

## 2018-01-16 NOTE — Telephone Encounter (Signed)
Beata was scheduled to start rehab today and did not attend.  Called to check on her.  Her husband answered the mobile number and said he was at work and would have her call us.

## 2018-01-23 ENCOUNTER — Encounter: Payer: Medicare Other | Admitting: *Deleted

## 2018-01-23 DIAGNOSIS — Z951 Presence of aortocoronary bypass graft: Secondary | ICD-10-CM | POA: Diagnosis not present

## 2018-01-23 DIAGNOSIS — Z87891 Personal history of nicotine dependence: Secondary | ICD-10-CM | POA: Diagnosis not present

## 2018-01-23 LAB — GLUCOSE, CAPILLARY
GLUCOSE-CAPILLARY: 102 mg/dL — AB (ref 65–99)
GLUCOSE-CAPILLARY: 101 mg/dL — AB (ref 65–99)

## 2018-01-23 NOTE — Progress Notes (Signed)
Daily Session Note  Patient Details  Name: Kayla Malone MRN: [id number] Date of Birth: [birth year] Referring Provider:     Cardiac Rehab from 12/28/2017 in [facility] Cardiac and Pul[facility] Rehab  Referring Provider  [name] Date: 01/23/2018  Check In: Session Check In - 01/23/18 0939      Check-In   Location  [facility]-Cardiac & Pulmonary Rehab    Staff Present  Alberteen Sam, MA, RCEP, CCRP, Exercise Physiologist;Amanda Oletta Darter, BA, ACSM CEP, Exercise Physiologist;Susanne Bice, RN, BSN, CCRP    Supervising physician immediately available to respond to emergencies  See telemetry face sheet for immediately available ER MD    Medication changes reported      No    Fall or balance concerns reported     No    Warm-up and Cool-down  Performed on first and last piece of equipment    Resistance Training Performed  Yes    VAD Patient?  No      Pain Assessment   Currently in Pain?  No/denies    Multiple Pain Sites  No          Social History   Tobacco Use  Smoking Status Former Smoker  . Last attempt to quit: 09/13/1998  . Years since quitting: 19.3  Smokeless Tobacco Never Used    Goals Met:  Exercise tolerated well No report of cardiac concerns or symptoms Strength training completed today  Goals Unmet:  Not Applicable  Comments: First full day of exercise!  Patient was oriented to gym and equipment including functions, settings, policies, and procedures.  Patient's individual exercise prescription and treatment plan were reviewed.  All starting workloads were established based on the results of the 6 minute walk test done at initial orientation visit.  The plan for exercise progression was also introduced and progression will be customized based on patient's performance and goals.    Dr. Emily Filbert is Medical Director for [place] and [name].

## 2018-01-25 DIAGNOSIS — Z951 Presence of aortocoronary bypass graft: Secondary | ICD-10-CM

## 2018-01-25 LAB — GLUCOSE, CAPILLARY
GLUCOSE-CAPILLARY: 142 mg/dL — AB (ref 65–99)
GLUCOSE-CAPILLARY: 142 mg/dL — AB (ref 65–99)

## 2018-01-25 NOTE — Progress Notes (Signed)
Daily Session Note  Patient Details  Name: GEORGIANA SPILLANE MRN: [id number] Date of Birth: 1961/06/25 Referring Provider:     Cardiac Rehab from 12/28/2017 in [facility] Cardiac and Pulmonary Rehab  Referring Provider  [name] Date: 01/25/2018  Check In: Session Check In - 01/25/18 0836      Check-In   Location  [facility]-Cardiac & Pulmonary Rehab    Staff Present  Alberteen Sam, MA, RCEP, CCRP, Exercise Physiologist;Janeth Terry Oletta Darter, BA, ACSM CEP, Exercise Physiologist;Carroll Enterkin, RN, BSN    Supervising physician immediately available to respond to emergencies  See telemetry face sheet for immediately available ER MD    Medication changes reported      No    Fall or balance concerns reported     No    Warm-up and Cool-down  Performed on first and last piece of equipment    Resistance Training Performed  Yes    VAD Patient?  No      Pain Assessment   Currently in Pain?  No/denies    Multiple Pain Sites  No          Social History   Tobacco Use  Smoking Status Former Smoker  . Last attempt to quit: 09/13/1998  . Years since quitting: 19.3  Smokeless Tobacco Never Used    Goals Met:  Independence with exercise equipment Exercise tolerated well No report of cardiac concerns or symptoms Strength training completed today  Goals Unmet:  Not Applicable  Comments: Pt able to follow exercise prescription today without complaint.  Will continue to monitor for progression.    Dr. Emily Filbert is Medical Director for [place] and [name].

## 2018-02-01 DIAGNOSIS — Z951 Presence of aortocoronary bypass graft: Secondary | ICD-10-CM | POA: Diagnosis not present

## 2018-02-01 LAB — GLUCOSE, CAPILLARY
GLUCOSE-CAPILLARY: 149 mg/dL — AB (ref 65–99)
Glucose-Capillary: 106 mg/dL — ABNORMAL HIGH (ref 65–99)

## 2018-02-01 NOTE — Progress Notes (Signed)
Daily Session Note  Patient Details  Name: THAILY HACKWORTH MRN: [id number] Date of Birth: 10-31-1961 Referring Provider:     Cardiac Rehab from 12/28/2017 in [facility] Cardiac and Pulmonary Rehab  Referring Provider  [name] Date: 02/01/2018  Check In: Session Check In - 02/01/18 0954      Check-In   Location  [facility]-Cardiac & Pulmonary Rehab    Staff Present  Alberteen Sam, MA, RCEP, CCRP, Exercise Physiologist;Aneya Daddona Oletta Darter, BA, ACSM CEP, Exercise Physiologist;Carroll Enterkin, RN, BSN    Supervising physician immediately available to respond to emergencies  See telemetry face sheet for immediately available ER MD    Medication changes reported      No    Fall or balance concerns reported     No    Warm-up and Cool-down  Performed on first and last piece of equipment    Resistance Training Performed  Yes    VAD Patient?  No      Pain Assessment   Currently in Pain?  No/denies    Multiple Pain Sites  No          Social History   Tobacco Use  Smoking Status Former Smoker  . Last attempt to quit: 09/13/1998  . Years since quitting: 19.4  Smokeless Tobacco Never Used    Goals Met:  Independence with exercise equipment Exercise tolerated well No report of cardiac concerns or symptoms Strength training completed today  Goals Unmet:  Not Applicable  Comments: Pt able to follow exercise prescription today without complaint.  Will continue to monitor for progression.    Dr. Emily Filbert is Medical Director for [place] and [name].

## 2018-02-06 DIAGNOSIS — Z951 Presence of aortocoronary bypass graft: Secondary | ICD-10-CM

## 2018-02-06 NOTE — Progress Notes (Signed)
Daily Session Note  Patient Details  Name: Kayla Malone MRN: [id number] Date of Birth: [birth year] Referring Provider:     Cardiac Rehab from 12/28/2017 in [facility] Cardiac and Pulmonary Rehab  Referring Provider  [name] Date: 02/06/2018  Check In: Session Check In - 02/06/18 0829      Check-In   Location  [facility]-Cardiac & Pulmonary Rehab    Staff Present  Alberteen Sam, MA, RCEP, CCRP, Exercise Physiologist;Ginia Rudell Oletta Darter, BA, ACSM CEP, Exercise Physiologist;Susanne Bice, RN, BSN, CCRP    Supervising physician immediately available to respond to emergencies  See telemetry face sheet for immediately available ER MD    Medication changes reported      No    Fall or balance concerns reported     No    Warm-up and Cool-down  Performed on first and last piece of equipment    Resistance Training Performed  Yes    VAD Patient?  No      Pain Assessment   Currently in Pain?  No/denies    Multiple Pain Sites  No          Social History   Tobacco Use  Smoking Status Former Smoker  . Last attempt to quit: 09/13/1998  . Years since quitting: 19.4  Smokeless Tobacco Never Used    Goals Met:  Independence with exercise equipment Exercise tolerated well No report of cardiac concerns or symptoms Strength training completed today  Goals Unmet:  Not Applicable  Comments: Pt able to follow exercise prescription today without complaint.  Will continue to monitor for progression.    Dr. Emily Filbert is Medical Director for [place] and [name].

## 2018-02-07 ENCOUNTER — Encounter: Payer: Self-pay | Admitting: *Deleted

## 2018-02-07 DIAGNOSIS — Z951 Presence of aortocoronary bypass graft: Secondary | ICD-10-CM

## 2018-02-07 NOTE — Progress Notes (Signed)
Cardiac Individual Treatment Plan  Patient Details  Name: Kayla Malone MRN: [id number] Date of Birth: [birth year] Referring Provider:     Cardiac Rehab from 12/28/2017 in [facility] Cardiac and Pulmonary Rehab  Referring Provider  Humphrey Rolls      Initial Encounter Date:    Cardiac Rehab from 12/28/2017 in [facility] Cardiac and Pulmonary Rehab  Date  12/28/17  Referring Provider  Humphrey Rolls      Visit Diagnosis: S/P CABG x 2  Patient's Home Medications on Admission:  Current Outpatient Medications:  .  acetaminophen (TYLENOL) 500 MG tablet, Take 1,000 mg by mouth every 6 (six) hours as needed for mild pain or headache., Disp: , Rfl:  .  albuterol (PROVENTIL HFA;VENTOLIN HFA) 108 (90 Base) MCG/ACT inhaler, Inhale 2 puffs into the lungs every 6 (six) hours as needed for wheezing or shortness of breath. , Disp: , Rfl:  .  aspirin EC 81 MG tablet, Take 81 mg by mouth daily., Disp: , Rfl:  .  atorvastatin (LIPITOR) 40 MG tablet, Take 40 mg by mouth daily., Disp: , Rfl:  .  buPROPion (ZYBAN) 150 MG 12 hr tablet, Take 150 mg by mouth 2 (two) times daily., Disp: , Rfl: 5 .  fluticasone (FLONASE) 50 MCG/ACT nasal spray, Place 2 sprays into both nostrils daily., Disp: , Rfl:  .  Fluticasone-Salmeterol (ADVAIR DISKUS) 500-50 MCG/DOSE AEPB, Inhale 1 puff into the lungs daily as needed (SOB). , Disp: , Rfl:  .  hydrochlorothiazide (MICROZIDE) 12.5 MG capsule, Take 12.5 mg by mouth., Disp: , Rfl:  .  metFORMIN (GLUCOPHAGE) 500 MG tablet, Take 500 mg by mouth 3 (three) times daily. , Disp: , Rfl: 5 .  metoprolol tartrate (LOPRESSOR) 25 MG tablet, Take 1 tablet (25 mg total) 2 (two) times daily by mouth., Disp: 60 tablet, Rfl: 1 .  mirtazapine (REMERON) 15 MG tablet, TAKE 1 TABLET BY MOUTH EVERYDAY AT BEDTIME, Disp: , Rfl: 2 .  oxyCODONE (OXY IR/ROXICODONE) 5 MG immediate release tablet, Take 1 tablet (5 mg total) every 4 (four) hours as needed by mouth for severe pain. (Patient not taking: Reported on 12/28/2017),  Disp: 30 tablet, Rfl: 0 .  pantoprazole (PROTONIX) 40 MG tablet, Take 40 mg by mouth. , Disp: , Rfl:  .  sitaGLIPtin (JANUVIA) 50 MG tablet, Take 50 mg by mouth daily., Disp: , Rfl:  .  traZODone (DESYREL) 50 MG tablet, Take 50 mg by mouth at bedtime., Disp: , Rfl: 5 .  vitamin B-12 (CYANOCOBALAMIN) 1000 MCG tablet, Take by mouth., Disp: , Rfl:  .  Vitamin D, Ergocalciferol, (DRISDOL) 50000 units CAPS capsule, Take 50,000 Units every 7 (seven) days by mouth. tuesday, Disp: , Rfl:  .  VOLTAREN 1 % GEL, APPLY 2 GRAMS TOPICALLY 4 TIMES DAILY AS NEEDED, Disp: , Rfl: 2  Past Medical History: Past Medical History:  Diagnosis Date  . Asthma   . Coronary artery disease   . Depression   . Diabetes mellitus without complication ([place])   . GERD (gastroesophageal reflux disease)   . History of hiatal hernia   . Hypertension   . Pain    BACK  . Stroke ([place])    x3  . Thyroid cyst     Tobacco Use: Social History   Tobacco Use  Smoking Status Former Smoker  . Last attempt to quit: 09/13/1998  . Years since quitting: 19.4  Smokeless Tobacco Never Used    Labs: Recent Review [facility] for [facility] Cardiac and  Pulmonary Rehab Latest Ref Rng & Units 10/23/2017 10/23/2017 10/23/2017 10/23/2017 10/24/2017   Hemoglobin A1c 4.8 - 5.6 % 7.3(H) - - - -   PHART 7.350 - 7.450 - 7.359 7.402 - -   PCO2ART 32.0 - 48.0 mmHg - 44.0 41.4 - -   HCO3 20.0 - 28.0 mmol/L - 25.1 26.0 - -   TCO2 22 - 32 mmol/L - _0 ACIDBASEDEF 0.0 - 2.0 mmol/L - 1.0 - - -   O2SAT % - 99.0 99.0 - -       Exercise Target Goals:    Exercise Program Goal: Individual exercise prescription set using results from initial 6 min walk test and THRR while considering  patient's activity barriers and safety.   Exercise Prescription Goal: Initial exercise prescription builds to 30-45 minutes a day of aerobic activity, 2-3 days per week.  Home exercise guidelines will be given to patient during program as part of  exercise prescription that the participant will acknowledge.  Activity Barriers & Risk Stratification: Activity Barriers & Cardiac Risk Stratification - 12/28/17 1430      Activity Barriers & Cardiac Risk Stratification   Activity Barriers  Back Problems;Muscular Weakness;Chest Pain/Angina hx of stroke so left sided weakness    Cardiac Risk Stratification  Moderate       6 Minute Walk: 6 Minute Walk    Row Name 12/28/17 1500         6 Minute Walk   Distance  610 feet     Walk Time  6 minutes     # of Rest Breaks  1     MPH  1.15     METS  1.52     RPE  11     Perceived Dyspnea   1     VO2 Peak  5.3     Symptoms  Yes (comment)     Comments  felt like L leg "giving out"  - had stroke     Resting HR  71 bpm     Resting BP  118/82     Resting Oxygen Saturation   99 %     Exercise Oxygen Saturation  during 6 min walk  100 %     Max Ex. HR  101 bpm     Max Ex. BP  150/78     2 Minute Post BP  148/78        Oxygen Initial Assessment:   Oxygen Re-Evaluation:   Oxygen Discharge (Final Oxygen Re-Evaluation):   Initial Exercise Prescription: Initial Exercise Prescription - 12/28/17 1500      Date of Initial Exercise RX and Referring Provider   Date  12/28/17    Referring Provider  Humphrey Rolls      Recumbant Bike   Level  1    RPM  60    Minutes  15    METs  1.5      NuStep   Level  1    SPM  80    Minutes  15    METs  1.5      Biostep-RELP   Level  1    SPM  50    Minutes  15    METs  2      Track   Laps  15    Minutes  15    METs  1.5      Prescription Details   Frequency (times per week)  3    Duration  Progress to 45 minutes of aerobic exercise without signs/symptoms of physical distress      Intensity   THRR 40-80% of Max Heartrate  107-145    Ratings of Perceived Exertion  11-13    Perceived Dyspnea  0-4      Resistance Training   Training Prescription  Yes    Weight  2 lb    Reps  10-15       Perform Capillary Blood Glucose checks as  needed.  Exercise Prescription Changes: Exercise Prescription Changes    Row Name 12/28/17 1400 01/30/18 1500           Response to Exercise   Blood Pressure (Admit)  118/82  114/72      Blood Pressure (Exercise)  150/78  134/72      Blood Pressure (Exit)  130/78  138/80      Heart Rate (Admit)  80 bpm  70 bpm      Heart Rate (Exercise)  87 bpm  115 bpm      Heart Rate (Exit)  68 bpm  66 bpm      Oxygen Saturation (Admit)  99 %  -      Oxygen Saturation (Exit)  100 %  -      Rating of Perceived Exertion (Exercise)  11  13      Symptoms  -  fatigue      Comments  -  second full day of exercise      Duration  -  Progress to 45 minutes of aerobic exercise without signs/symptoms of physical distress      Intensity  -  THRR unchanged        Progression   Progression  -  Continue to progress workloads to maintain intensity without signs/symptoms of physical distress.      Average METs  -  1.61        Resistance Training   Training Prescription  -  Yes      Weight  -  2 lb      Reps  -  10-15        Interval Training   Interval Training  -  No        NuStep   Level  -  1      Minutes  -  15      METs  -  1.3        Track   Laps  -  20      Minutes  -  15      METs  -  1.92         Exercise Comments: Exercise Comments    Row Name 01/23/18 0941           Exercise Comments   First full day of exercise!  Patient was oriented to gym and equipment including functions, settings, policies, and procedures.  Patient's individual exercise prescription and treatment plan were reviewed.  All starting workloads were established based on the results of the 6 minute walk test done at initial orientation visit.  The plan for exercise progression was also introduced and progression will be customized based on patient's performance and goals.          Exercise Goals and Review: Exercise Goals    Row Name 12/28/17 1459             Exercise Goals   Increase Physical Activity   Yes       Intervention  Provide advice, education, support and counseling about physical activity/exercise needs.;Develop an individualized exercise prescription for aerobic and resistive training based on initial evaluation findings, risk stratification, comorbidities and participant's personal goals.       Expected Outcomes  Short Term: Attend rehab on a regular basis to increase amount of physical activity.;Long Term: Add in home exercise to make exercise part of routine and to increase amount of physical activity.;Long Term: Exercising regularly at least 3-5 days a week.       Increase Strength and Stamina  Yes       Intervention  Provide advice, education, support and counseling about physical activity/exercise needs.;Develop an individualized exercise prescription for aerobic and resistive training based on initial evaluation findings, risk stratification, comorbidities and participant's personal goals.       Expected Outcomes  Short Term: Increase workloads from initial exercise prescription for resistance, speed, and METs.;Short Term: Perform resistance training exercises routinely during rehab and add in resistance training at home;Long Term: Improve cardiorespiratory fitness, muscular endurance and strength as measured by increased METs and functional capacity (6MWT)       Able to understand and use rate of perceived exertion (RPE) scale  Yes       Intervention  Provide education and explanation on how to use RPE scale       Expected Outcomes  Short Term: Able to use RPE daily in rehab to express subjective intensity level;Long Term:  Able to use RPE to guide intensity level when exercising independently       Able to understand and use Dyspnea scale  Yes       Intervention  Provide education and explanation on how to use Dyspnea scale       Expected Outcomes  Short Term: Able to use Dyspnea scale daily in rehab to express subjective sense of shortness of breath during exertion;Long Term: Able  to use Dyspnea scale to guide intensity level when exercising independently       Knowledge and understanding of Target Heart Rate Range (THRR)  Yes       Intervention  Provide education and explanation of THRR including how the numbers were predicted and where they are located for reference       Expected Outcomes  Short Term: Able to state/look up THRR;Short Term: Able to use daily as guideline for intensity in rehab;Long Term: Able to use THRR to govern intensity when exercising independently       Able to check pulse independently  Yes       Intervention  Provide education and demonstration on how to check pulse in carotid and radial arteries.;Review the importance of being able to check your own pulse for safety during independent exercise       Expected Outcomes  Short Term: Able to explain why pulse checking is important during independent exercise;Long Term: Able to check pulse independently and accurately       Understanding of Exercise Prescription  Yes       Intervention  Provide education, explanation, and written materials on patient's individual exercise prescription       Expected Outcomes  Short Term: Able to explain program exercise prescription;Long Term: Able to explain home exercise prescription to exercise independently          Exercise Goals Re-Evaluation : Exercise Goals Re-Evaluation    Row Name 01/23/18 0941 01/30/18 1513           Exercise Goal Re-Evaluation   Exercise Goals Review  Increase  Physical Activity;Increase Strength and Stamina;Knowledge and understanding of Target Heart Rate Range (THRR);Able to understand and use rate of perceived exertion (RPE) scale  Increase Physical Activity;Understanding of Exercise Prescription;Increase Strength and Stamina      Comments  Reviewed RPE scale, THR and program prescription with pt today.  Pt voiced understanding and was given a copy of goals to take home.   Kayla Malone has completed two full days of exercise.  She is still  building up her stamina.  She missed today as she overslept and will aim for Thursday.  We will continue to monitor her progression.       Expected Outcomes  Short: Use RPE daily to regulate intensity.  Long: Follow program prescription in THR.  Short: Attend rehab regularly.  Long: Continue to follow program prescription.          Discharge Exercise Prescription (Final Exercise Prescription Changes): Exercise Prescription Changes - 01/30/18 1500      Response to Exercise   Blood Pressure (Admit)  114/72    Blood Pressure (Exercise)  134/72    Blood Pressure (Exit)  138/80    Heart Rate (Admit)  70 bpm    Heart Rate (Exercise)  115 bpm    Heart Rate (Exit)  66 bpm    Rating of Perceived Exertion (Exercise)  13    Symptoms  fatigue    Comments  second full day of exercise    Duration  Progress to 45 minutes of aerobic exercise without signs/symptoms of physical distress    Intensity  THRR unchanged      Progression   Progression  Continue to progress workloads to maintain intensity without signs/symptoms of physical distress.    Average METs  1.61      Resistance Training   Training Prescription  Yes    Weight  2 lb    Reps  10-15      Interval Training   Interval Training  No      NuStep   Level  1    Minutes  15    METs  1.3      Track   Laps  20    Minutes  15    METs  1.92       Nutrition:  Target Goals: Understanding of nutrition guidelines, daily intake of sodium '1500mg'$ , cholesterol '200mg'$ , calories 30% from fat and 7% or less from saturated fats, daily to have 5 or more servings of fruits and vegetables.  Biometrics: Pre Biometrics - 12/28/17 1458      Pre Biometrics   Height  5' 4.5" (1.638 m)    Weight  149 lb 1.6 oz (67.6 kg)    Waist Circumference  34.5 inches    Hip Circumference  39.5 inches    Waist to Hip Ratio  0.87 %    BMI (Calculated)  25.21        Nutrition Therapy Plan and Nutrition Goals:   Nutrition Assessments: Nutrition  Assessments - 12/28/17 1410      MEDFICTS Scores   Pre Score  -- Will fill out when appetite returns       Nutrition Goals Re-Evaluation:   Nutrition Goals Discharge (Final Nutrition Goals Re-Evaluation):   Psychosocial: Target Goals: Acknowledge presence or absence of significant depression and/or stress, maximize coping skills, provide positive support system. Participant is able to verbalize types and ability to use techniques and skills needed for reducing stress and depression.   Initial Review & Psychosocial Screening: Initial Psych  Review & Screening - 12/28/17 1425      Initial Review   Current issues with  History of Depression;Current Stress Concerns    Source of Stress Concerns  Poor Coping Skills;Unable to participate in former interests or hobbies    Comments  Kayla Malone has little drive to do much around the house or to get healthier.       Family Dynamics   Good Support System?  No one daughter did accompany her to Med Review and also cooks some for her, but Kayla Malone states she doesn't feel she has a good support system    Strains  Intra-family strains      Screening Interventions   Interventions  Encouraged to exercise;Program counselor consult;To provide support and resources with identified psychosocial needs    Expected Outcomes  Short Term goal: Utilizing psychosocial counselor, staff and physician to assist with identification of specific Stressors or current issues interfering with healing process. Setting desired goal for each stressor or current issue identified.;Long Term Goal: Stressors or current issues are controlled or eliminated.;Short Term goal: Identification and review with participant of any Quality of Life or Depression concerns found by scoring the questionnaire.;Long Term goal: The participant improves quality of Life and PHQ9 Scores as seen by post scores and/or verbalization of changes       Quality of Life Scores:  Quality of Life - 12/28/17 1428       Quality of Life Scores   Health/Function Pre  20.2 %    Socioeconomic Pre  19.43 %    Psych/Spiritual Pre  20.86 %    Family Pre  20.6 %    GLOBAL Pre  20.24 %      Scores of 19 and below usually indicate a poorer quality of life in these areas.  A difference of  2-3 points is a clinically meaningful difference.  A difference of 2-3 points in the total score of the Quality of Life Index has been associated with significant improvement in overall quality of life, self-image, physical symptoms, and general health in studies assessing change in quality of life.  PHQ-9: Recent Review Flowsheet Data    Depression screen [facility] 2/9 12/28/2017   Decreased Interest 2   Down, Depressed, Hopeless 2   PHQ - 2 Score 4   Altered sleeping 2   Tired, decreased energy 2   Change in appetite 3   Feeling bad or failure about yourself  0   Trouble concentrating 1   Moving slowly or fidgety/restless 0   Suicidal thoughts 0   PHQ-9 Score 12   Difficult doing work/chores Not difficult at all     Interpretation of Total Score  Total Score Depression Severity:  1-4 = Minimal depression, 5-9 = Mild depression, 10-14 = Moderate depression, 15-19 = Moderately severe depression, 20-27 = Severe depression   Psychosocial Evaluation and Intervention: Psychosocial Evaluation - 01/25/18 0949      Psychosocial Evaluation & Interventions   Interventions  Stress management education;Relaxation education;Encouraged to exercise with the program and follow exercise prescription    Comments  Counselor met with Kayla Malone) today for initial psychosocial evaluation.  She is a 57 year old who had a CABGx3 mid-November.  She also has a history of a stroke ~6 years ago.  She has a strong support system with (4) adult children locally and is involved in her local church.  Kayla Malone states she does not sleep well (maybe 4 hours/average per night) as she has been taken  off the medication that helped her sleep better in the past.   She also has just begun a new medication to increase her appetite as she reports losing a "lot of weight" since the surgery.  She reports a history of depression and was on medication for this.  She is clearly depressed currently with a PHQ-9 of "12" indicating moderate depressive symptoms.  Kayla Malone agrees that she is currently depressed and is "afraid" of exercise or doing anything that will cause another heart attack, so she is isolating and not engaged in activities that were once pleasurable to her.  With her sleep issues; appetite; difficulty concentrating and her negative mood, the counselor recommended Kayla Malone speak with her Dr. today about medication for depression.  She has multiple stressors with her health and $$ as well.  Kayla Malone admitted she does not want to be in this CR program as she is afraid of another heart attack.  Counselor assured her she is in good hands, but the depression and fear is preventing her from making positive choices at this time.  Counselor commended [place] for even being here - when she doesn't want to, and for at least agreeing to "try" for several weeks before discontinuing this program.  Counselor will follow with Stanton Malone throughout the course of this program.      Expected Outcomes  Kayla Malone will benefit from consistent exercise to strengthen her heart and improve her mental health.  She will contact her Dr. Chancy Milroy?) today re: medication for her mood and will take as directed.  Counselor will follow.      Continue Psychosocial Services   Follow up required by counselor       Psychosocial Re-Evaluation:   Psychosocial Discharge (Final Psychosocial Re-Evaluation):   Vocational Rehabilitation: Provide vocational rehab assistance to qualifying candidates.   Vocational Rehab Evaluation & Intervention: Vocational Rehab - 12/28/17 1429      Initial Vocational Rehab Evaluation & Intervention   Assessment shows need for Vocational Rehabilitation  No       Education: Education  Goals: Education classes will be provided on a variety of topics geared toward better understanding of heart health and risk factor modification. Participant will state understanding/return demonstration of topics presented as noted by education test scores.  Learning Barriers/Preferences: Learning Barriers/Preferences - 12/28/17 1429      Learning Barriers/Preferences   Learning Barriers  None    Learning Preferences  Audio;Video       Education Topics:  AED/CPR: - Group verbal and written instruction with the use of models to demonstrate the basic use of the AED with the basic ABC's of resuscitation.   Cardiac Rehab from 02/06/2018 in [facility] Cardiac and Pulmonary Rehab  Date  01/23/18  Educator  [facility]  Instruction Review Code  1- Verbalizes Understanding      General Nutrition Guidelines/Fats and Fiber: -Group instruction provided by verbal, written material, models and posters to present the general guidelines for heart healthy nutrition. Gives an explanation and review of dietary fats and fiber.   Controlling Sodium/Reading Food Labels: -Group verbal and written material supporting the discussion of sodium use in heart healthy nutrition. Review and explanation with models, verbal and written materials for utilization of the food label.   Exercise Physiology & General Exercise Guidelines: - Group verbal and written instruction with models to review the exercise physiology of the cardiovascular system and associated critical values. Provides general exercise guidelines with specific guidelines to those with heart or lung disease.    Cardiac  Rehab from 02/06/2018 in [facility] Cardiac and Pulmonary Rehab  Date  02/01/18  Educator  [facility]  Instruction Review Code  1- Verbalizes Understanding      Aerobic Exercise & Resistance Training: - Gives group verbal and written instruction on the various components of exercise. Focuses on aerobic and resistive training programs and the benefits of this  training and how to safely progress through these programs..   Cardiac Rehab from 02/06/2018 in [facility] Cardiac and Pulmonary Rehab  Date  02/06/18  Educator  [facility]  Instruction Review Code  1- Verbalizes Understanding      Flexibility, Balance, Mind/Body Relaxation: Provides group verbal/written instruction on the benefits of flexibility and balance training, including mind/body exercise modes such as yoga, pilates and tai chi.  Demonstration and skill practice provided.   Stress and Anxiety: - Provides group verbal and written instruction about the health risks of elevated stress and causes of high stress.  Discuss the correlation between heart/lung disease and anxiety and treatment options. Review healthy ways to manage with stress and anxiety.   Depression: - Provides group verbal and written instruction on the correlation between heart/lung disease and depressed mood, treatment options, and the stigmas associated with seeking treatment.   Anatomy & Physiology of the Heart: - Group verbal and written instruction and models provide basic cardiac anatomy and physiology, with the coronary electrical and arterial systems. Review of Valvular disease and Heart Failure   Cardiac Procedures: - Group verbal and written instruction to review commonly prescribed medications for heart disease. Reviews the medication, class of the drug, and side effects. Includes the steps to properly store meds and maintain the prescription regimen. (beta blockers and nitrates)   Cardiac Medications I: - Group verbal and written instruction to review commonly prescribed medications for heart disease. Reviews the medication, class of the drug, and side effects. Includes the steps to properly store meds and maintain the prescription regimen.   Cardiac Medications II: -Group verbal and written instruction to review commonly prescribed medications for heart disease. Reviews the medication, class of the drug, and side  effects. (all other drug classes)    Go Sex-Intimacy & Heart Disease, Get SMART - Goal Setting: - Group verbal and written instruction through game format to discuss heart disease and the return to sexual intimacy. Provides group verbal and written material to discuss and apply goal setting through the application of the S.M.A.R.T. Method.   Other Matters of the Heart: - Provides group verbal, written materials and models to describe Stable Angina and Peripheral Artery. Includes description of the disease process and treatment options available to the cardiac patient.   Exercise & Equipment Safety: - Individual verbal instruction and demonstration of equipment use and safety with use of the equipment.   Cardiac Rehab from 02/06/2018 in [facility] Cardiac and Pulmonary Rehab  Date  12/28/17  Educator  [facility]  Instruction Review Code  1- Verbalizes Understanding      Infection Prevention: - Provides verbal and written material to individual with discussion of infection control including proper hand washing and proper equipment cleaning during exercise session.   Cardiac Rehab from 02/06/2018 in [facility] Cardiac and Pulmonary Rehab  Date  12/28/17  Educator  [facility]  Instruction Review Code  1- Verbalizes Understanding      Falls Prevention: - Provides verbal and written material to individual with discussion of falls prevention and safety.   Cardiac Rehab from 02/06/2018 in [facility] Cardiac and Pulmonary Rehab  Date  12/28/17  Educator  [facility]  Instruction Review Code  1- Verbalizes Understanding      Diabetes: - Individual verbal and written instruction to review signs/symptoms of diabetes, desired ranges of glucose level fasting, after meals and with exercise. Acknowledge that pre and post exercise glucose checks will be done for 3 sessions at entry of program.   Cardiac Rehab from 02/06/2018 in [facility] Cardiac and Pulmonary Rehab  Date  12/28/17  Educator  [facility]  Instruction Review Code  1- Verbalizes  Understanding      Know Your Numbers and Risk Factors: -Group verbal and written instruction about important numbers in your health.  Discussion of what are risk factors and how they play a role in the disease process.  Review of Cholesterol, Blood Pressure, Diabetes, and BMI and the role they play in your overall health.   Sleep Hygiene: -Provides group verbal and written instruction about how sleep can affect your health.  Define sleep hygiene, discuss sleep cycles and impact of sleep habits. Review good sleep hygiene tips.    Other: -Provides group and verbal instruction on various topics (see comments)   Knowledge Questionnaire Score: Knowledge Questionnaire Score - 12/28/17 1429      Knowledge Questionnaire Score   Pre Score  19/28 correct answers reviewed with [facility]       Core Components/Risk Factors/Patient Goals at Admission: Personal Goals and Risk Factors at Admission - 12/28/17 1351      Core Components/Risk Factors/Patient Goals on Admission    Weight Management  Yes;Weight Maintenance    Intervention  Weight Management: Develop a combined nutrition and exercise program designed to reach desired caloric intake, while maintaining appropriate intake of nutrient and fiber, sodium and fats, and appropriate energy expenditure required for the weight goal.;Weight Management: Provide education and appropriate resources to help participant work on and attain dietary goals.    Admit Weight  135 lb (61.2 kg) Kayla Malone reports having a no desire to eat and has been losing weight without trying    Expected Outcomes  Short Term: Continue to assess and modify interventions until short term weight is achieved;Long Term: Adherence to nutrition and physical activity/exercise program aimed toward attainment of established weight goal;Weight Maintenance: Understanding of the daily nutrition guidelines, which includes 25-35% calories from fat, 7% or less cal from saturated fats, less than 284m  cholesterol, less than 1.5gm of sodium, & 5 or more servings of fruits and vegetables daily;Understanding recommendations for meals to include 15-35% energy as protein, 25-35% energy from fat, 35-60% energy from carbohydrates, less than 2054mof dietary cholesterol, 20-35 gm of total fiber daily;Understanding of distribution of calorie intake throughout the day with the consumption of 4-5 meals/snacks    Diabetes  Yes    Intervention  Provide education about signs/symptoms and action to take for hypo/hyperglycemia.;Provide education about proper nutrition, including hydration, and aerobic/resistive exercise prescription along with prescribed medications to achieve blood glucose in normal ranges: Fasting glucose 65-99 mg/dL    Expected Outcomes  Long Term: Attainment of HbA1C < 7%.;Short Term: Participant verbalizes understanding of the signs/symptoms and immediate care of hyper/hypoglycemia, proper foot care and importance of medication, aerobic/resistive exercise and nutrition plan for blood glucose control.    Hypertension  Yes    Intervention  Provide education on lifestyle modifcations including regular physical activity/exercise, weight management, moderate sodium restriction and increased consumption of fresh fruit, vegetables, and low fat dairy, alcohol moderation, and smoking cessation.;Monitor prescription use compliance.    Expected Outcomes  Short Term: Continued assessment and  intervention until BP is < 140/31m HG in hypertensive participants. < 130/829mHG in hypertensive participants with diabetes, heart failure or chronic Malone disease.;Long Term: Maintenance of blood pressure at goal levels.    Lipids  Yes    Intervention  Provide education and support for participant on nutrition & aerobic/resistive exercise along with prescribed medications to achieve LDL <7056mHDL >58m48m  Expected Outcomes  Short Term: Participant states understanding of desired cholesterol values and is compliant  with medications prescribed. Participant is following exercise prescription and nutrition guidelines.;Long Term: Cholesterol controlled with medications as prescribed, with individualized exercise RX and with personalized nutrition plan. Value goals: LDL < 70mg62mL > 40 mg.       Core Components/Risk Factors/Patient Goals Review:    Core Components/Risk Factors/Patient Goals at Discharge (Final Review):    ITP Comments: ITP Comments    Row Name 12/28/17 1224 01/10/18 0606 01/16/18 1036 02/07/18 0617     ITP Comments  Med Review completed. Initial ITP created. Diagnosis can be found in [facility]  30 Day review. Continue with ITP unless directed changes per Medical Director review.   Sessions start 2/5  Kayla Sheryllscheduled to start rehab today and did not attend.  Called to check on her.  Her husband answered the mobile number and said he was at work and would have her call us.  [place] review. Continue with ITP unless directed changes per Medical Director review.  3 visits this month       Comments:

## 2018-02-12 ENCOUNTER — Other Ambulatory Visit: Payer: Self-pay | Admitting: Internal Medicine

## 2018-02-12 DIAGNOSIS — Z1239 Encounter for other screening for malignant neoplasm of breast: Secondary | ICD-10-CM

## 2018-02-13 ENCOUNTER — Encounter: Payer: Medicare Other | Attending: Cardiovascular Disease | Admitting: *Deleted

## 2018-02-13 DIAGNOSIS — Z951 Presence of aortocoronary bypass graft: Secondary | ICD-10-CM | POA: Insufficient documentation

## 2018-02-13 DIAGNOSIS — Z87891 Personal history of nicotine dependence: Secondary | ICD-10-CM | POA: Insufficient documentation

## 2018-02-13 NOTE — Progress Notes (Signed)
Daily Session Note  Patient Details  Name: SOLANGE EMRY MRN: [id number] Date of Birth: 1961-08-29 Referring Provider:     Cardiac Rehab from 12/28/2017 in [facility] Cardiac and Pulmonary Rehab  Referring Provider  [name] Date: 02/13/2018  Check In: Session Check In - 02/13/18 0911      Check-In   Location  [facility]-Cardiac & Pulmonary Rehab    Staff Present  Heath Lark, RN, BSN, CCRP;Jessica Cowlington, MA, RCEP, CCRP, Exercise Physiologist;Yanci Bachtell Oletta Darter, [place], ACSM CEP, Exercise Physiologist    Supervising physician immediately available to respond to emergencies  See telemetry face sheet for immediately available ER MD    Medication changes reported      No    Fall or balance concerns reported     No    Warm-up and Cool-down  Performed on first and last piece of equipment    Resistance Training Performed  Yes    VAD Patient?  No      Pain Assessment   Currently in Pain?  No/denies    Multiple Pain Sites  No          Social History   Tobacco Use  Smoking Status Former Smoker  . Last attempt to quit: 09/13/1998  . Years since quitting: 19.4  Smokeless Tobacco Never Used    Goals Met:  Independence with exercise equipment Exercise tolerated well No report of cardiac concerns or symptoms Strength training completed today  Goals Unmet:  Not Applicable  Comments: Pt able to follow exercise prescription today without complaint.  Will continue to monitor for progression.    Dr. Emily Filbert is Medical Director for [facility] and [name].

## 2018-02-15 DIAGNOSIS — Z951 Presence of aortocoronary bypass graft: Secondary | ICD-10-CM | POA: Diagnosis not present

## 2018-02-15 NOTE — Progress Notes (Signed)
Daily Session Note  Patient Details  Name: Kayla Malone MRN: [id number] Date of Birth: Jan 02, 1961 Referring Provider:     Cardiac Rehab from 12/28/2017 in [facility] Cardiac and Pulmonary Rehab  Referring Provider  [name] Date: 02/15/2018  Check In: Session Check In - 02/15/18 0830      Check-In   Location  [facility]-Cardiac & Pulmonary Rehab    Staff Present  Alberteen Sam, MA, RCEP, CCRP, Exercise Physiologist;Amanda Oletta Darter, BA, ACSM CEP, Exercise Physiologist;Carroll Enterkin, RN, BSN    Supervising physician immediately available to respond to emergencies  See telemetry face sheet for immediately available ER MD    Medication changes reported      No    Fall or balance concerns reported     No    Warm-up and Cool-down  Performed on first and last piece of equipment    Resistance Training Performed  Yes    VAD Patient?  No      Pain Assessment   Currently in Pain?  No/denies    Multiple Pain Sites  No          Social History   Tobacco Use  Smoking Status Former Smoker  . Last attempt to quit: 09/13/1998  . Years since quitting: 19.4  Smokeless Tobacco Never Used    Goals Met:  Independence with exercise equipment Exercise tolerated well No report of cardiac concerns or symptoms Strength training completed today  Goals Unmet:  Not Applicable  Comments: Pt able to follow exercise prescription today without complaint.  Will continue to monitor for progression.    Dr. Emily Filbert is Medical Director for [facility] and [name].

## 2018-02-20 DIAGNOSIS — Z951 Presence of aortocoronary bypass graft: Secondary | ICD-10-CM

## 2018-02-20 NOTE — Progress Notes (Signed)
Daily Session Note  Patient Details  Name: Kayla Malone MRN: [id number] Date of Birth: [birth year] Referring Provider:     Cardiac Rehab from 12/28/2017 in [facility] Cardiac and Pulmonary Rehab  Referring Provider  [name] Date: 02/20/2018  Check In: Session Check In - 02/20/18 0828      Check-In   Location  [facility]-Cardiac & Pulmonary Rehab    Staff Present  Alberteen Sam, MA, RCEP, CCRP, Exercise Physiologist;Amanda Oletta Darter, BA, ACSM CEP, Exercise Physiologist;Susanne Bice, RN, BSN, CCRP    Supervising physician immediately available to respond to emergencies  See telemetry face sheet for immediately available ER MD    Medication changes reported      No    Fall or balance concerns reported     No    Warm-up and Cool-down  Performed on first and last piece of equipment    Resistance Training Performed  Yes    VAD Patient?  No      Pain Assessment   Currently in Pain?  No/denies    Multiple Pain Sites  No          Social History   Tobacco Use  Smoking Status Former Smoker  . Last attempt to quit: 09/13/1998  . Years since quitting: 19.4  Smokeless Tobacco Never Used    Goals Met:  Independence with exercise equipment Exercise tolerated well Personal goals reviewed No report of cardiac concerns or symptoms Strength training completed today  Goals Unmet:  Not Applicable  Comments: Pt able to follow exercise prescription today without complaint.  Will continue to monitor for progression. Reviewed home exercise with pt today.  Pt plans to walk at home and stores for exercise.  Reviewed THR, pulse, RPE, sign and symptoms, and when to call 911 or MD.  Also discussed weather considerations and indoor options.  Pt voiced understanding.    Dr. Emily Filbert is Medical Director for [place] and [name].

## 2018-02-27 DIAGNOSIS — Z951 Presence of aortocoronary bypass graft: Secondary | ICD-10-CM

## 2018-02-27 NOTE — Progress Notes (Signed)
Daily Session Note  Patient Details  Name: Kayla Malone MRN: [id number] Date of Birth: 25-Jul-1961 Referring Provider:     Cardiac Rehab from 12/28/2017 in [facility] Cardiac and Pulmonary Rehab  Referring Provider  [name] Date: 02/27/2018  Check In: Session Check In - 02/27/18 0916      Check-In   Location  [facility]-Cardiac & Pulmonary Rehab    Staff Present  Alberteen Sam, MA, RCEP, CCRP, Exercise Physiologist;Alahna Dunne Oletta Darter, BA, ACSM CEP, Exercise Physiologist;Susanne Bice, RN, BSN, CCRP    Supervising physician immediately available to respond to emergencies  See telemetry face sheet for immediately available ER MD    Medication changes reported      No    Fall or balance concerns reported     No    Warm-up and Cool-down  Performed on first and last piece of equipment    Resistance Training Performed  Yes    VAD Patient?  No      Pain Assessment   Currently in Pain?  No/denies    Multiple Pain Sites  No          Social History   Tobacco Use  Smoking Status Former Smoker  . Last attempt to quit: 09/13/1998  . Years since quitting: 19.4  Smokeless Tobacco Never Used    Goals Met:  Independence with exercise equipment Exercise tolerated well No report of cardiac concerns or symptoms Strength training completed today  Goals Unmet:  Not Applicable  Comments: Pt able to follow exercise prescription today without complaint.  Will continue to monitor for progression.    Dr. Emily Filbert is Medical Director for [place] and [name].

## 2018-03-07 ENCOUNTER — Encounter: Payer: Self-pay | Admitting: *Deleted

## 2018-03-07 DIAGNOSIS — Z951 Presence of aortocoronary bypass graft: Secondary | ICD-10-CM

## 2018-03-07 NOTE — Progress Notes (Signed)
Cardiac Individual Treatment Plan  Patient Details  Name: Kayla Malone MRN: [id number] Date of Birth: 10-08-1961 Referring Provider:     Cardiac Rehab from 12/28/2017 in [facility] Cardiac and Pulmonary Rehab  Referring Provider  Humphrey Rolls      Initial Encounter Date:    Cardiac Rehab from 12/28/2017 in [facility] Cardiac and Pulmonary Rehab  Date  12/28/17  Referring Provider  Humphrey Rolls      Visit Diagnosis: S/P CABG x 2  Patient's Home Medications on Admission:  Current Outpatient Medications:  .  acetaminophen (TYLENOL) 500 MG tablet, Take 1,000 mg by mouth every 6 (six) hours as needed for mild pain or headache., Disp: , Rfl:  .  albuterol (PROVENTIL HFA;VENTOLIN HFA) 108 (90 Base) MCG/ACT inhaler, Inhale 2 puffs into the lungs every 6 (six) hours as needed for wheezing or shortness of breath. , Disp: , Rfl:  .  aspirin EC 81 MG tablet, Take 81 mg by mouth daily., Disp: , Rfl:  .  atorvastatin (LIPITOR) 40 MG tablet, Take 40 mg by mouth daily., Disp: , Rfl:  .  buPROPion (ZYBAN) 150 MG 12 hr tablet, Take 150 mg by mouth 2 (two) times daily., Disp: , Rfl: 5 .  fluticasone (FLONASE) 50 MCG/ACT nasal spray, Place 2 sprays into both nostrils daily., Disp: , Rfl:  .  Fluticasone-Salmeterol (ADVAIR DISKUS) 500-50 MCG/DOSE AEPB, Inhale 1 puff into the lungs daily as needed (SOB). , Disp: , Rfl:  .  hydrochlorothiazide (MICROZIDE) 12.5 MG capsule, Take 12.5 mg by mouth., Disp: , Rfl:  .  metFORMIN (GLUCOPHAGE) 500 MG tablet, Take 500 mg by mouth 3 (three) times daily. , Disp: , Rfl: 5 .  metoprolol tartrate (LOPRESSOR) 25 MG tablet, Take 1 tablet (25 mg total) 2 (two) times daily by mouth., Disp: 60 tablet, Rfl: 1 .  mirtazapine (REMERON) 15 MG tablet, TAKE 1 TABLET BY MOUTH EVERYDAY AT BEDTIME, Disp: , Rfl: 2 .  oxyCODONE (OXY IR/ROXICODONE) 5 MG immediate release tablet, Take 1 tablet (5 mg total) every 4 (four) hours as needed by mouth for severe pain. (Patient not taking: Reported on 12/28/2017),  Disp: 30 tablet, Rfl: 0 .  pantoprazole (PROTONIX) 40 MG tablet, Take 40 mg by mouth. , Disp: , Rfl:  .  sitaGLIPtin (JANUVIA) 50 MG tablet, Take 50 mg by mouth daily., Disp: , Rfl:  .  traZODone (DESYREL) 50 MG tablet, Take 50 mg by mouth at bedtime., Disp: , Rfl: 5 .  vitamin B-12 (CYANOCOBALAMIN) 1000 MCG tablet, Take by mouth., Disp: , Rfl:  .  Vitamin D, Ergocalciferol, (DRISDOL) 50000 units CAPS capsule, Take 50,000 Units every 7 (seven) days by mouth. tuesday, Disp: , Rfl:  .  VOLTAREN 1 % GEL, APPLY 2 GRAMS TOPICALLY 4 TIMES DAILY AS NEEDED, Disp: , Rfl: 2  Past Medical History: Past Medical History:  Diagnosis Date  . Asthma   . Coronary artery disease   . Depression   . Diabetes mellitus without complication ([place])   . GERD (gastroesophageal reflux disease)   . History of hiatal hernia   . Hypertension   . Pain    BACK  . Stroke ([place])    x3  . Thyroid cyst     Tobacco Use: Social History   Tobacco Use  Smoking Status Former Smoker  . Last attempt to quit: 09/13/1998  . Years since quitting: 19.4  Smokeless Tobacco Never Used    Labs: Recent Review [facility] for [facility] Cardiac and  Pulmonary Rehab Latest Ref Rng & Units 10/23/2017 10/23/2017 10/23/2017 10/23/2017 10/24/2017   Hemoglobin A1c 4.8 - 5.6 % 7.3(H) - - - -   PHART 7.350 - 7.450 - 7.359 7.402 - -   PCO2ART 32.0 - 48.0 mmHg - 44.0 41.4 - -   HCO3 20.0 - 28.0 mmol/L - 25.1 26.0 - -   TCO2 22 - 32 mmol/L - _0 ACIDBASEDEF 0.0 - 2.0 mmol/L - 1.0 - - -   O2SAT % - 99.0 99.0 - -       Exercise Target Goals:    Exercise Program Goal: Individual exercise prescription set using results from initial 6 min walk test and THRR while considering  patient's activity barriers and safety.   Exercise Prescription Goal: Initial exercise prescription builds to 30-45 minutes a day of aerobic activity, 2-3 days per week.  Home exercise guidelines will be given to patient during program as part of  exercise prescription that the participant will acknowledge.  Activity Barriers & Risk Stratification: Activity Barriers & Cardiac Risk Stratification - 12/28/17 1430      Activity Barriers & Cardiac Risk Stratification   Activity Barriers  Back Problems;Muscular Weakness;Chest Pain/Angina hx of stroke so left sided weakness    Cardiac Risk Stratification  Moderate       6 Minute Walk: 6 Minute Walk    Row Name 12/28/17 1500         6 Minute Walk   Distance  610 feet     Walk Time  6 minutes     # of Rest Breaks  1     MPH  1.15     METS  1.52     RPE  11     Perceived Dyspnea   1     VO2 Peak  5.3     Symptoms  Yes (comment)     Comments  felt like L leg "giving out"  - had stroke     Resting HR  71 bpm     Resting BP  118/82     Resting Oxygen Saturation   99 %     Exercise Oxygen Saturation  during 6 min walk  100 %     Max Ex. HR  101 bpm     Max Ex. BP  150/78     2 Minute Post BP  148/78        Oxygen Initial Assessment:   Oxygen Re-Evaluation:   Oxygen Discharge (Final Oxygen Re-Evaluation):   Initial Exercise Prescription: Initial Exercise Prescription - 12/28/17 1500      Date of Initial Exercise RX and Referring Provider   Date  12/28/17    Referring Provider  Humphrey Rolls      Recumbant Bike   Level  1    RPM  60    Minutes  15    METs  1.5      NuStep   Level  1    SPM  80    Minutes  15    METs  1.5      Biostep-RELP   Level  1    SPM  50    Minutes  15    METs  2      Track   Laps  15    Minutes  15    METs  1.5      Prescription Details   Frequency (times per week)  3    Duration  Progress to 45 minutes of aerobic exercise without signs/symptoms of physical distress      Intensity   THRR 40-80% of Max Heartrate  107-145    Ratings of Perceived Exertion  11-13    Perceived Dyspnea  0-4      Resistance Training   Training Prescription  Yes    Weight  2 lb    Reps  10-15       Perform Capillary Blood Glucose checks as  needed.  Exercise Prescription Changes: Exercise Prescription Changes    Row Name 12/28/17 1400 01/30/18 1500 02/13/18 1400 02/20/18 0900 02/27/18 1400     Response to Exercise   Blood Pressure (Admit)  118/82  114/72  128/70  -  122/62   Blood Pressure (Exercise)  150/78  134/72  128/64  -  112/66   Blood Pressure (Exit)  130/78  138/80  122/72  -  114/66   Heart Rate (Admit)  80 bpm  70 bpm  77 bpm  -  81 bpm   Heart Rate (Exercise)  87 bpm  115 bpm  119 bpm  -  102 bpm   Heart Rate (Exit)  68 bpm  66 bpm  72 bpm  -  68 bpm   Oxygen Saturation (Admit)  99 %  -  -  -  -   Oxygen Saturation (Exit)  100 %  -  -  -  -   Rating of Perceived Exertion (Exercise)  '11  13  13  '$ -  13   Symptoms  -  fatigue  fatigue  -  fatigue   Comments  -  second full day of exercise  -  -  -   Duration  -  Progress to 45 minutes of aerobic exercise without signs/symptoms of physical distress  Continue with 30 min of aerobic exercise without signs/symptoms of physical distress.  -  Continue with 30 min of aerobic exercise without signs/symptoms of physical distress.   Intensity  -  THRR unchanged  THRR unchanged  -  THRR unchanged     Progression   Progression  -  Continue to progress workloads to maintain intensity without signs/symptoms of physical distress.  Continue to progress workloads to maintain intensity without signs/symptoms of physical distress.  -  Continue to progress workloads to maintain intensity without signs/symptoms of physical distress.   Average METs  -  1.61  1.65  -  2.26     Resistance Training   Training Prescription  -  Yes  Yes  -  Yes   Weight  -  2 lb  2 lbs  -  2 lbs   Reps  -  10-15  10-15  -  10-15     Interval Training   Interval Training  -  No  No  -  No     NuStep   Level  -  1  1  -  1   Minutes  -  15  15  -  15   METs  -  1.3  1.3  -  2.4     Biostep-RELP   Level  -  -  -  -  2   Minutes  -  -  -  -  15   METs  -  -  -  -  2     Track   Laps  -  20  21  -   30   Minutes  -  15  15  -  15   METs  -  1.92  2  -  2.38     Home Exercise Plan   Plans to continue exercise at  -  -  -  Home (comment) walking  Home (comment) walking   Frequency  -  -  -  Add 1 additional day to program exercise sessions.  Add 1 additional day to program exercise sessions.   Initial Home Exercises Provided  -  -  -  02/20/18  02/20/18      Exercise Comments: Exercise Comments    Row Name 01/23/18 0941           Exercise Comments   First full day of exercise!  Patient was oriented to gym and equipment including functions, settings, policies, and procedures.  Patient's individual exercise prescription and treatment plan were reviewed.  All starting workloads were established based on the results of the 6 minute walk test done at initial orientation visit.  The plan for exercise progression was also introduced and progression will be customized based on patient's performance and goals.          Exercise Goals and Review: Exercise Goals    Row Name 12/28/17 1459             Exercise Goals   Increase Physical Activity  Yes       Intervention  Provide advice, education, support and counseling about physical activity/exercise needs.;Develop an individualized exercise prescription for aerobic and resistive training based on initial evaluation findings, risk stratification, comorbidities and participant's personal goals.       Expected Outcomes  Short Term: Attend rehab on a regular basis to increase amount of physical activity.;Long Term: Add in home exercise to make exercise part of routine and to increase amount of physical activity.;Long Term: Exercising regularly at least 3-5 days a week.       Increase Strength and Stamina  Yes       Intervention  Provide advice, education, support and counseling about physical activity/exercise needs.;Develop an individualized exercise prescription for aerobic and resistive training based on initial evaluation findings, risk  stratification, comorbidities and participant's personal goals.       Expected Outcomes  Short Term: Increase workloads from initial exercise prescription for resistance, speed, and METs.;Short Term: Perform resistance training exercises routinely during rehab and add in resistance training at home;Long Term: Improve cardiorespiratory fitness, muscular endurance and strength as measured by increased METs and functional capacity (6MWT)       Able to understand and use rate of perceived exertion (RPE) scale  Yes       Intervention  Provide education and explanation on how to use RPE scale       Expected Outcomes  Short Term: Able to use RPE daily in rehab to express subjective intensity level;Long Term:  Able to use RPE to guide intensity level when exercising independently       Able to understand and use Dyspnea scale  Yes       Intervention  Provide education and explanation on how to use Dyspnea scale       Expected Outcomes  Short Term: Able to use Dyspnea scale daily in rehab to express subjective sense of shortness of breath during exertion;Long Term: Able to use Dyspnea scale to guide intensity level when exercising independently       Knowledge and understanding of Target Heart Rate Range (THRR)  Yes  Intervention  Provide education and explanation of THRR including how the numbers were predicted and where they are located for reference       Expected Outcomes  Short Term: Able to state/look up THRR;Short Term: Able to use daily as guideline for intensity in rehab;Long Term: Able to use THRR to govern intensity when exercising independently       Able to check pulse independently  Yes       Intervention  Provide education and demonstration on how to check pulse in carotid and radial arteries.;Review the importance of being able to check your own pulse for safety during independent exercise       Expected Outcomes  Short Term: Able to explain why pulse checking is important during independent  exercise;Long Term: Able to check pulse independently and accurately       Understanding of Exercise Prescription  Yes       Intervention  Provide education, explanation, and written materials on patient's individual exercise prescription       Expected Outcomes  Short Term: Able to explain program exercise prescription;Long Term: Able to explain home exercise prescription to exercise independently          Exercise Goals Re-Evaluation : Exercise Goals Re-Evaluation    Row Name 01/23/18 0941 01/30/18 1513 02/13/18 1449 02/20/18 0920       Exercise Goal Re-Evaluation   Exercise Goals Review  Increase Physical Activity;Increase Strength and Stamina;Knowledge and understanding of Target Heart Rate Range (THRR);Able to understand and use rate of perceived exertion (RPE) scale  Increase Physical Activity;Understanding of Exercise Prescription;Increase Strength and Stamina  Understanding of Exercise Prescription;Increase Physical Activity;Increase Strength and Stamina  Understanding of Exercise Prescription;Increase Physical Activity;Increase Strength and Stamina;Knowledge and understanding of Target Heart Rate Range (THRR);Able to check pulse independently;Able to understand and use rate of perceived exertion (RPE) scale    Comments  Reviewed RPE scale, THR and program prescription with pt today.  Pt voiced understanding and was given a copy of goals to take home.   Kayla Malone has completed two full days of exercise.  She is still building up her stamina.  She missed today as she overslept and will aim for Thursday.  We will continue to monitor her progression.   Kayla Malone is doing well in rehab.  She does come late, but she is still coming.  She is now up to 21 laps on the track.  We will continue to monitor her progression.   Kayla Malone has been doing well in rehab. She is able to do more in class and getting her full 20 min on the machines. Reviewed home exercise with pt today.  Pt plans to walk at home and stores for  exercise.  Reviewed THR, pulse, RPE, sign and symptoms, and when to call 911 or MD.  Also discussed weather considerations and indoor options.  Pt voiced understanding.    Expected Outcomes  Short: Use RPE daily to regulate intensity.  Long: Follow program prescription in THR.  Short: Attend rehab regularly.  Long: Continue to follow program prescription.   Short: Review home exercise and encourage attendance to class and to [facility].  Long: Continue to work on [employer].   Short: Start to add in 1 extra day of walking at home.   Long: Continue to build strength and stamina.        Discharge Exercise Prescription (Final Exercise Prescription Changes): Exercise Prescription Changes - 02/27/18 1400      Response to Exercise  Blood Pressure (Admit)  122/62    Blood Pressure (Exercise)  112/66    Blood Pressure (Exit)  114/66    Heart Rate (Admit)  81 bpm    Heart Rate (Exercise)  102 bpm    Heart Rate (Exit)  68 bpm    Rating of Perceived Exertion (Exercise)  13    Symptoms  fatigue    Duration  Continue with 30 min of aerobic exercise without signs/symptoms of physical distress.    Intensity  THRR unchanged      Progression   Progression  Continue to progress workloads to maintain intensity without signs/symptoms of physical distress.    Average METs  2.26      Resistance Training   Training Prescription  Yes    Weight  2 lbs    Reps  10-15      Interval Training   Interval Training  No      NuStep   Level  1    Minutes  15    METs  2.4      Biostep-RELP   Level  2    Minutes  15    METs  2      Track   Laps  30    Minutes  15    METs  2.38      Home Exercise Plan   Plans to continue exercise at  Home (comment) walking    Frequency  Add 1 additional day to program exercise sessions.    Initial Home Exercises Provided  02/20/18       Nutrition:  Target Goals: Understanding of nutrition guidelines, daily intake of sodium '1500mg'$ , cholesterol  '200mg'$ , calories 30% from fat and 7% or less from saturated fats, daily to have 5 or more servings of fruits and vegetables.  Biometrics: Pre Biometrics - 12/28/17 1458      Pre Biometrics   Height  5' 4.5" (1.638 m)    Weight  149 lb 1.6 oz (67.6 kg)    Waist Circumference  34.5 inches    Hip Circumference  39.5 inches    Waist to Hip Ratio  0.87 %    BMI (Calculated)  25.21        Nutrition Therapy Plan and Nutrition Goals:   Nutrition Assessments: Nutrition Assessments - 12/28/17 1410      MEDFICTS Scores   Pre Score  -- Will fill out when appetite returns       Nutrition Goals Re-Evaluation: Nutrition Goals Re-Evaluation    [place] Name 02/20/18 0914             Goals   Nutrition Goal  Meet with dietician to review diet tweaks for weight loss       Comment  Scheduled appointment with Lattie Haw to talk with [facility] during class.  She is starting to get her appetite back and would like some help with weightloss.        Expected Outcome  Short: meet with dietician.  Long: Continue to follow recommendations.           Nutrition Goals Discharge (Final Nutrition Goals Re-Evaluation): Nutrition Goals Re-Evaluation - 02/20/18 0914      Goals   Nutrition Goal  Meet with dietician to review diet tweaks for weight loss    Comment  Scheduled appointment with Lattie Haw to talk with [facility] during class.  She is starting to get her appetite back and would like some help with weightloss.     Expected Outcome  Short: meet with dietician.  Long: Continue to follow recommendations.        Psychosocial: Target Goals: Acknowledge presence or absence of significant depression and/or stress, maximize coping skills, provide positive support system. Participant is able to verbalize types and ability to use techniques and skills needed for reducing stress and depression.   Initial Review & Psychosocial Screening: Initial Psych Review & Screening - 12/28/17 1425      Initial Review   Current issues  with  History of Depression;Current Stress Concerns    Source of Stress Concerns  Poor Coping Skills;Unable to participate in former interests or hobbies    Comments  Keriann has little drive to do much around the house or to get healthier.       Family Dynamics   Good Support System?  No one daughter did accompany her to Med Review and also cooks some for her, but Tanvi states she doesn't feel she has a good support system    Strains  Intra-family strains      Screening Interventions   Interventions  Encouraged to exercise;Program counselor consult;To provide support and resources with identified psychosocial needs    Expected Outcomes  Short Term goal: Utilizing psychosocial counselor, staff and physician to assist with identification of specific Stressors or current issues interfering with healing process. Setting desired goal for each stressor or current issue identified.;Long Term Goal: Stressors or current issues are controlled or eliminated.;Short Term goal: Identification and review with participant of any Quality of Life or Depression concerns found by scoring the questionnaire.;Long Term goal: The participant improves quality of Life and PHQ9 Scores as seen by post scores and/or verbalization of changes       Quality of Life Scores:  Quality of Life - 12/28/17 1428      Quality of Life Scores   Health/Function Pre  20.2 %    Socioeconomic Pre  19.43 %    Psych/Spiritual Pre  20.86 %    Family Pre  20.6 %    GLOBAL Pre  20.24 %      Scores of 19 and below usually indicate a poorer quality of life in these areas.  A difference of  2-3 points is a clinically meaningful difference.  A difference of 2-3 points in the total score of the Quality of Life Index has been associated with significant improvement in overall quality of life, self-image, physical symptoms, and general health in studies assessing change in quality of life.  PHQ-9: Recent Review Flowsheet Data    Depression screen  [facility] 2/9 12/28/2017   Decreased Interest 2   Down, Depressed, Hopeless 2   PHQ - 2 Score 4   Altered sleeping 2   Tired, decreased energy 2   Change in appetite 3   Feeling bad or failure about yourself  0   Trouble concentrating 1   Moving slowly or fidgety/restless 0   Suicidal thoughts 0   PHQ-9 Score 12   Difficult doing work/chores Not difficult at all     Interpretation of Total Score  Total Score Depression Severity:  1-4 = Minimal depression, 5-9 = Mild depression, 10-14 = Moderate depression, 15-19 = Moderately severe depression, 20-27 = Severe depression   Psychosocial Evaluation and Intervention: Psychosocial Evaluation - 01/25/18 0949      Psychosocial Evaluation & Interventions   Interventions  Stress management education;Relaxation education;Encouraged to exercise with the program and follow exercise prescription    Comments  Counselor met with Ms. Kayla Malone) today  for initial psychosocial evaluation.  She is a 57 year old who had a CABGx3 mid-November.  She also has a history of a stroke ~6 years ago.  She has a strong support system with (4) adult children locally and is involved in her local church.  Kayla Malone states she does not sleep well (maybe 4 hours/average per night) as she has been taken off the medication that helped her sleep better in the past.  She also has just begun a new medication to increase her appetite as she reports losing a "lot of weight" since the surgery.  She reports a history of depression and was on medication for this.  She is clearly depressed currently with a PHQ-9 of "12" indicating moderate depressive symptoms.  Kayla Malone agrees that she is currently depressed and is "afraid" of exercise or doing anything that will cause another heart attack, so she is isolating and not engaged in activities that were once pleasurable to her.  With her sleep issues; appetite; difficulty concentrating and her negative mood, the counselor recommended Inda speak with her Dr.  today about medication for depression.  She has multiple stressors with her health and $$ as well.  Kayla Malone admitted she does not want to be in this CR program as she is afraid of another heart attack.  Counselor assured her she is in good hands, but the depression and fear is preventing her from making positive choices at this time.  Counselor commended Kayla Malone for even being here - when she doesn't want to, and for at least agreeing to "try" for several weeks before discontinuing this program.  Counselor will follow with Stanton Malone throughout the course of this program.      Expected Outcomes  Kayla Malone will benefit from consistent exercise to strengthen her heart and improve her mental health.  She will contact her Dr. Chancy Malone?) today re: medication for her mood and will take as directed.  Counselor will follow.      Continue Psychosocial Services   Follow up required by counselor       Psychosocial Re-Evaluation: Psychosocial Re-Evaluation    [place] Name 02/20/18 [phone]             Psychosocial Re-Evaluation   Current issues with  Current Depression;Current Stress Concerns       Comments  Kayla Malone talked to her doctor about her depression.  They uped her medication from '30mg'$  to 50 mg to help her eat and depression.   She can't really tell much difference.  She still doesn't feel like doing a whole lot or getting out.  She did attend [facility] last month and is going to try to go today. She is so fearful that something else is going to happen again.  Her fear is limiting her ability to move forward from this event.  We will continue to work with her during rehab.        Expected Outcomes  Short: Go to [facility]  Long: Continue to cope with her depression.       Interventions  Encouraged to attend Cardiac Rehabilitation for the exercise;Stress management education       Continue Psychosocial Services   Follow up required by counselor       Comments  Lack of ambition, does not want to get out, fearful of something  else happening.          Initial Review   Source of Stress Concerns  Poor Coping Skills;Unable to participate in former interests or hobbies;Chronic Illness  Psychosocial Discharge (Final Psychosocial Re-Evaluation): Psychosocial Re-Evaluation - 02/20/18 0916      Psychosocial Re-Evaluation   Current issues with  Current Depression;Current Stress Concerns    Comments  Kayla Malone talked to her doctor about her depression.  They uped her medication from '30mg'$  to 50 mg to help her eat and depression.   She can't really tell much difference.  She still doesn't feel like doing a whole lot or getting out.  She did attend [facility] last month and is going to try to go today. She is so fearful that something else is going to happen again.  Her fear is limiting her ability to move forward from this event.  We will continue to work with her during rehab.     Expected Outcomes  Short: Go to [facility]  Long: Continue to cope with her depression.    Interventions  Encouraged to attend Cardiac Rehabilitation for the exercise;Stress management education    Continue Psychosocial Services   Follow up required by counselor    Comments  Lack of ambition, does not want to get out, fearful of something else happening.       Initial Review   Source of Stress Concerns  Poor Coping Skills;Unable to participate in former interests or hobbies;Chronic Illness       Vocational Rehabilitation: Provide vocational rehab assistance to qualifying candidates.   Vocational Rehab Evaluation & Intervention: Vocational Rehab - 12/28/17 1429      Initial Vocational Rehab Evaluation & Intervention   Assessment shows need for Vocational Rehabilitation  No       Education: Education Goals: Education classes will be provided on a variety of topics geared toward better understanding of heart health and risk factor modification. Participant will state understanding/return demonstration of topics presented as noted by  education test scores.  Learning Barriers/Preferences: Learning Barriers/Preferences - 12/28/17 1429      Learning Barriers/Preferences   Learning Barriers  None    Learning Preferences  Audio;Video       Education Topics:  AED/CPR: - Group verbal and written instruction with the use of models to demonstrate the basic use of the AED with the basic ABC's of resuscitation.   Cardiac Rehab from 02/27/2018 in [facility] Cardiac and Pulmonary Rehab  Date  01/23/18  Educator  SB  Instruction Review Code  1- Verbalizes Understanding      General Nutrition Guidelines/Fats and Fiber: -Group instruction provided by verbal, written material, models and posters to present the general guidelines for heart healthy nutrition. Gives an explanation and review of dietary fats and fiber.   Controlling Sodium/Reading Food Labels: -Group verbal and written material supporting the discussion of sodium use in heart healthy nutrition. Review and explanation with models, verbal and written materials for utilization of the food label.   Exercise Physiology & General Exercise Guidelines: - Group verbal and written instruction with models to review the exercise physiology of the cardiovascular system and associated critical values. Provides general exercise guidelines with specific guidelines to those with heart or lung disease.    Cardiac Rehab from 02/27/2018 in [facility] Cardiac and Pulmonary Rehab  Date  02/01/18  Educator  [facility]  Instruction Review Code  1- Verbalizes Understanding      Aerobic Exercise & Resistance Training: - Gives group verbal and written instruction on the various components of exercise. Focuses on aerobic and resistive training programs and the benefits of this training and how to safely progress through these programs..   Cardiac Rehab from 02/27/2018  in [facility] Cardiac and Pulmonary Rehab  Date  02/06/18  Educator  [facility]  Instruction Review Code  1- Verbalizes Understanding       Flexibility, Balance, Mind/Body Relaxation: Provides group verbal/written instruction on the benefits of flexibility and balance training, including mind/body exercise modes such as yoga, pilates and tai chi.  Demonstration and skill practice provided.   Stress and Anxiety: - Provides group verbal and written instruction about the health risks of elevated stress and causes of high stress.  Discuss the correlation between heart/lung disease and anxiety and treatment options. Review healthy ways to manage with stress and anxiety.   Cardiac Rehab from 02/27/2018 in [facility] Cardiac and Pulmonary Rehab  Date  02/13/18  Educator  [facility]  Instruction Review Code  1- Verbalizes Understanding      Depression: - Provides group verbal and written instruction on the correlation between heart/lung disease and depressed mood, treatment options, and the stigmas associated with seeking treatment.   Anatomy & Physiology of the Heart: - Group verbal and written instruction and models provide basic cardiac anatomy and physiology, with the coronary electrical and arterial systems. Review of Valvular disease and Heart Failure   Cardiac Procedures: - Group verbal and written instruction to review commonly prescribed medications for heart disease. Reviews the medication, class of the drug, and side effects. Includes the steps to properly store meds and maintain the prescription regimen. (beta blockers and nitrates)   Cardiac Medications I: - Group verbal and written instruction to review commonly prescribed medications for heart disease. Reviews the medication, class of the drug, and side effects. Includes the steps to properly store meds and maintain the prescription regimen.   Cardiac Rehab from 02/27/2018 in [facility] Cardiac and Pulmonary Rehab  Date  02/20/18  Educator  SB  Instruction Review Code  1- Verbalizes Understanding      Cardiac Medications II: -Group verbal and written instruction to review  commonly prescribed medications for heart disease. Reviews the medication, class of the drug, and side effects. (all other drug classes)   Cardiac Rehab from 02/27/2018 in [facility] Cardiac and Pulmonary Rehab  Date  02/15/18  Educator  [facility]  Instruction Review Code  1- Verbalizes Understanding       Go Sex-Intimacy & Heart Disease, Get SMART - Goal Setting: - Group verbal and written instruction through game format to discuss heart disease and the return to sexual intimacy. Provides group verbal and written material to discuss and apply goal setting through the application of the S.M.A.R.T. Method.   Other Matters of the Heart: - Provides group verbal, written materials and models to describe Stable Angina and Peripheral Artery. Includes description of the disease process and treatment options available to the cardiac patient.   Exercise & Equipment Safety: - Individual verbal instruction and demonstration of equipment use and safety with use of the equipment.   Cardiac Rehab from 02/27/2018 in [facility] Cardiac and Pulmonary Rehab  Date  12/28/17  Educator  [facility]  Instruction Review Code  1- Verbalizes Understanding      Infection Prevention: - Provides verbal and written material to individual with discussion of infection control including proper hand washing and proper equipment cleaning during exercise session.   Cardiac Rehab from 02/27/2018 in [facility] Cardiac and Pulmonary Rehab  Date  12/28/17  Educator  [facility]  Instruction Review Code  1- Verbalizes Understanding      Falls Prevention: - Provides verbal and written material to individual with discussion of falls prevention and safety.  Cardiac Rehab from 02/27/2018 in [facility] Cardiac and Pulmonary Rehab  Date  12/28/17  Educator  [facility]  Instruction Review Code  1- Verbalizes Understanding      Diabetes: - Individual verbal and written instruction to review signs/symptoms of diabetes, desired ranges of glucose level fasting, after meals and  with exercise. Acknowledge that pre and post exercise glucose checks will be done for 3 sessions at entry of program.   Cardiac Rehab from 02/27/2018 in [facility] Cardiac and Pulmonary Rehab  Date  12/28/17  Educator  [facility]  Instruction Review Code  1- Verbalizes Understanding      Know Your Numbers and Risk Factors: -Group verbal and written instruction about important numbers in your health.  Discussion of what are risk factors and how they play a role in the disease process.  Review of Cholesterol, Blood Pressure, Diabetes, and BMI and the role they play in your overall health.   Cardiac Rehab from 02/27/2018 in [facility] Cardiac and Pulmonary Rehab  Date  02/15/18  Educator  [facility]  Instruction Review Code  1- Verbalizes Understanding      Sleep Hygiene: -Provides group verbal and written instruction about how sleep can affect your health.  Define sleep hygiene, discuss sleep cycles and impact of sleep habits. Review good sleep hygiene tips.    Cardiac Rehab from 02/27/2018 in [facility] Cardiac and Pulmonary Rehab  Date  02/27/18  Educator  [facility]  Instruction Review Code  1- Verbalizes Understanding      Other: -Provides group and verbal instruction on various topics (see comments)   Knowledge Questionnaire Score: Knowledge Questionnaire Score - 12/28/17 1429      Knowledge Questionnaire Score   Pre Score  19/28 correct answers reviewed with [facility]       Core Components/Risk Factors/Patient Goals at Admission: Personal Goals and Risk Factors at Admission - 12/28/17 1351      Core Components/Risk Factors/Patient Goals on Admission    Weight Management  Yes;Weight Maintenance    Intervention  Weight Management: Develop a combined nutrition and exercise program designed to reach desired caloric intake, while maintaining appropriate intake of nutrient and fiber, sodium and fats, and appropriate energy expenditure required for the weight goal.;Weight Management: Provide education and appropriate  resources to help participant work on and attain dietary goals.    Admit Weight  135 lb (61.2 kg) Kayla Malone reports having a no desire to eat and has been losing weight without trying    Expected Outcomes  Short Term: Continue to assess and modify interventions until short term weight is achieved;Long Term: Adherence to nutrition and physical activity/exercise program aimed toward attainment of established weight goal;Weight Maintenance: Understanding of the daily nutrition guidelines, which includes 25-35% calories from fat, 7% or less cal from saturated fats, less than '200mg'$  cholesterol, less than 1.5gm of sodium, & 5 or more servings of fruits and vegetables daily;Understanding recommendations for meals to include 15-35% energy as protein, 25-35% energy from fat, 35-60% energy from carbohydrates, less than '200mg'$  of dietary cholesterol, 20-35 gm of total fiber daily;Understanding of distribution of calorie intake throughout the day with the consumption of 4-5 meals/snacks    Diabetes  Yes    Intervention  Provide education about signs/symptoms and action to take for hypo/hyperglycemia.;Provide education about proper nutrition, including hydration, and aerobic/resistive exercise prescription along with prescribed medications to achieve blood glucose in normal ranges: Fasting glucose 65-99 mg/dL    Expected Outcomes  Long Term: Attainment of HbA1C < 7%.;Short Term: Participant verbalizes understanding  of the signs/symptoms and immediate care of hyper/hypoglycemia, proper foot care and importance of medication, aerobic/resistive exercise and nutrition plan for blood glucose control.    Hypertension  Yes    Intervention  Provide education on lifestyle modifcations including regular physical activity/exercise, weight management, moderate sodium restriction and increased consumption of fresh fruit, vegetables, and low fat dairy, alcohol moderation, and smoking cessation.;Monitor prescription use compliance.     Expected Outcomes  Short Term: Continued assessment and intervention until BP is < 140/34m HG in hypertensive participants. < 130/833mHG in hypertensive participants with diabetes, heart failure or chronic Malone disease.;Long Term: Maintenance of blood pressure at goal levels.    Lipids  Yes    Intervention  Provide education and support for participant on nutrition & aerobic/resistive exercise along with prescribed medications to achieve LDL '70mg'$ , HDL >'40mg'$ .    Expected Outcomes  Short Term: Participant states understanding of desired cholesterol values and is compliant with medications prescribed. Participant is following exercise prescription and nutrition guidelines.;Long Term: Cholesterol controlled with medications as prescribed, with individualized exercise RX and with personalized nutrition plan. Value goals: LDL < '70mg'$ , HDL > 40 mg.       Core Components/Risk Factors/Patient Goals Review:  Goals and Risk Factor Review    Row Name 02/20/18 0907             Core Components/Risk Factors/Patient Goals Review   Personal Goals Review  Weight Management/Obesity;Diabetes;Hypertension;Lipids       Review  [place] been doing well in rehab.  Her weight was up three pounds today but she was not sure why, possibly more clothes trying to to stay warm.  Blood sugars have been pretty good but some days are higher than others.  She has started to notice a pattern of how she eats and her sugars.   Her blood pressures have good in class.  She does not check it at home unless she doesn't feel good.  No problems with her medications other than her skin reaction.  She has a yeast infection under breast and arms.  She is using a powder and was just starteing a new cream for it.       Expected Outcomes  Short: Continue to work on weight loss.  Long: Continue to monitor her blood sugars more closely.           Core Components/Risk Factors/Patient Goals at Discharge (Final Review):  Goals and Risk Factor  Review - 02/20/18 0907      Core Components/Risk Factors/Patient Goals Review   Personal Goals Review  Weight Management/Obesity;Diabetes;Hypertension;Lipids    Review  [place] been doing well in rehab.  Her weight was up three pounds today but she was not sure why, possibly more clothes trying to to stay warm.  Blood sugars have been pretty good but some days are higher than others.  She has started to notice a pattern of how she eats and her sugars.   Her blood pressures have good in class.  She does not check it at home unless she doesn't feel good.  No problems with her medications other than her skin reaction.  She has a yeast infection under breast and arms.  She is using a powder and was just starteing a new cream for it.    Expected Outcomes  Short: Continue to work on weight loss.  Long: Continue to monitor her blood sugars more closely.        ITP Comments: ITP Comments  [place] Name 12/28/17 1224 01/10/18 0606 01/16/18 1036 02/07/18 0617 03/07/18 9476   ITP Comments  Med Review completed. Initial ITP created. Diagnosis can be found in [facility] 10/26/17  30 Day review. Continue with ITP unless directed changes per Medical Director review.   Sessions start 2/5  Saniyya was scheduled to start rehab today and did not attend.  Called to check on her.  Her husband answered the mobile number and said he was at work and would have her call us.    30 day review. Continue with ITP unless directed changes per Medical Director review.  3 visits this month  30 Day review. Continue with ITP unless directed changes per Medical Director review.  Shakaya called yesterday to let us know she had fallen and was to see her MD about her injury      Comments:

## 2018-03-13 ENCOUNTER — Encounter: Payer: Medicare Other | Attending: Cardiovascular Disease

## 2018-03-13 ENCOUNTER — Encounter: Payer: Self-pay | Admitting: *Deleted

## 2018-03-13 ENCOUNTER — Telehealth: Payer: Self-pay | Admitting: *Deleted

## 2018-03-13 DIAGNOSIS — Z951 Presence of aortocoronary bypass graft: Secondary | ICD-10-CM

## 2018-03-13 DIAGNOSIS — Z87891 Personal history of nicotine dependence: Secondary | ICD-10-CM | POA: Insufficient documentation

## 2018-03-13 NOTE — Telephone Encounter (Signed)
Wiley called back.  She is hoping to return on Thursday.  She had slipped in the bathroom.  She is still a little sore in her hip, but her no pain in her chest. She forgot to set her alarm this morning.

## 2018-03-13 NOTE — Telephone Encounter (Signed)
Called to check on status of return.  We had a note that she had fallen at home last week. She has not been back since then.  The note was not dated. She answered first call to home number and lost connection, when I called back she did not answer and I was not able to leave a message.

## 2018-03-19 ENCOUNTER — Encounter: Payer: Medicare Other | Admitting: *Deleted

## 2018-03-19 DIAGNOSIS — Z87891 Personal history of nicotine dependence: Secondary | ICD-10-CM | POA: Diagnosis not present

## 2018-03-19 DIAGNOSIS — Z951 Presence of aortocoronary bypass graft: Secondary | ICD-10-CM

## 2018-03-20 ENCOUNTER — Encounter: Payer: Self-pay | Admitting: *Deleted

## 2018-03-20 NOTE — Progress Notes (Signed)
Daily Session Note  Patient Details  Name: Kayla Malone MRN: [id number] Date of Birth: 1961-04-29 Referring Provider:     Cardiac Rehab from 12/28/2017 in [facility] Cardiac and Pulmonary Rehab  Referring Provider  [name] Date: 03/19/2018  Check In: Session Check In - 03/19/18 1610      Check-In   Location  [facility]-Cardiac & Pulmonary Rehab    Staff Present  Nada Maclachlan, BA, ACSM CEP, Exercise Physiologist;Schylar Wuebker, RN, BSN;Meredith Sherryll Burger, RN BSN    Supervising physician immediately available to respond to emergencies  See telemetry face sheet for immediately available ER MD    Medication changes reported      No    Fall or balance concerns reported     No    Tobacco Cessation  No Change    Warm-up and Cool-down  Performed on first and last piece of equipment    Resistance Training Performed  Yes    VAD Patient?  No      Pain Assessment   Currently in Pain?  No/denies          Social History   Tobacco Use  Smoking Status Former Smoker  . Last attempt to quit: 09/13/1998  . Years since quitting: 19.5  Smokeless Tobacco Never Used    Goals Met:  Proper associated with RPD/PD & O2 Sat Exercise tolerated well No report of cardiac concerns or symptoms Strength training completed today  Goals Unmet:  Not Applicable  Comments:     Dr. Emily Filbert is Medical Director for [place] and [name].

## 2018-03-21 ENCOUNTER — Encounter: Payer: Medicare Other | Admitting: *Deleted

## 2018-03-21 DIAGNOSIS — Z951 Presence of aortocoronary bypass graft: Secondary | ICD-10-CM | POA: Diagnosis not present

## 2018-03-21 NOTE — Progress Notes (Signed)
Daily Session Note  Patient Details  Name: Kayla Malone MRN: [id number] Date of Birth: 26-Feb-1961 Referring Provider:     Cardiac Rehab from 12/28/2017 in [facility] Cardiac and Pulmonary Rehab  Referring Provider  [name] Date: 03/21/2018  Check In: Session Check In - 03/21/18 1704      Check-In   Location  [facility]-Cardiac & Pulmonary Rehab    Staff Present  Renita Papa, RN BSN;Carroll Enterkin, RN, Vickki Hearing, BA, ACSM CEP, Exercise Physiologist;Other Constance Goltz RN    Supervising physician immediately available to respond to emergencies  See telemetry face sheet for immediately available ER MD    Medication changes reported      No    Fall or balance concerns reported     No    Warm-up and Cool-down  Performed on first and last piece of equipment    Resistance Training Performed  Yes    VAD Patient?  No      Pain Assessment   Currently in Pain?  No/denies          Social History   Tobacco Use  Smoking Status Former Smoker  . Last attempt to quit: 09/13/1998  . Years since quitting: 19.5  Smokeless Tobacco Never Used    Goals Met:  Independence with exercise equipment Exercise tolerated well No report of cardiac concerns or symptoms Strength training completed today  Goals Unmet:  Not Applicable  Comments: Pt able to follow exercise prescription today without complaint.  Will continue to monitor for progression.    Dr. Emily Filbert is Medical Director for [place] and [name].

## 2018-03-26 DIAGNOSIS — Z951 Presence of aortocoronary bypass graft: Secondary | ICD-10-CM | POA: Diagnosis not present

## 2018-03-26 NOTE — Progress Notes (Signed)
Daily Session Note  Patient Details  Name: Kayla Malone MRN: [id number] Date of Birth: July 26, 1961 Referring Provider:     Cardiac Rehab from 12/28/2017 in [facility] Cardiac and Pulmonary Rehab  Referring Provider  [name] Date: 03/26/2018  Check In: Session Check In - 03/26/18 1713      Check-In   Location  [facility]-Cardiac & Pulmonary Rehab    Staff Present  Earlean Shawl, BS, ACSM CEP, Exercise Physiologist;Amanda Oletta Darter, BA, ACSM CEP, Exercise Physiologist;Carroll Enterkin, RN, BSN    Supervising physician immediately available to respond to emergencies  See telemetry face sheet for immediately available ER MD    Medication changes reported      No    Fall or balance concerns reported     No    Warm-up and Cool-down  Performed on first and last piece of equipment    Resistance Training Performed  Yes    VAD Patient?  No      Pain Assessment   Currently in Pain?  No/denies    Multiple Pain Sites  No          Social History   Tobacco Use  Smoking Status Former Smoker  . Last attempt to quit: 09/13/1998  . Years since quitting: 19.5  Smokeless Tobacco Never Used    Goals Met:  Independence with exercise equipment Exercise tolerated well Personal goals reviewed No report of cardiac concerns or symptoms Strength training completed today  Goals Unmet:  Not Applicable  Comments: Pt able to follow exercise prescription today without complaint.  Will continue to monitor for progression.    Dr. Emily Filbert is Medical Director for [place] and [name].

## 2018-03-28 ENCOUNTER — Encounter: Payer: Medicare Other | Admitting: *Deleted

## 2018-03-28 DIAGNOSIS — Z951 Presence of aortocoronary bypass graft: Secondary | ICD-10-CM

## 2018-03-28 NOTE — Progress Notes (Signed)
Daily Session Note  Patient Details  Name: Kayla Malone MRN: [id number] Date of Birth: 10-Sep-1961 Referring Provider:     Cardiac Rehab from 12/28/2017 in [facility] Cardiac and Pulmonary Rehab  Referring Provider  [name] Date: 03/28/2018  Check In: Session Check In - 03/28/18 1636      Check-In   Location  [facility]-Cardiac & Pulmonary Rehab    Staff Present  Renita Papa, RN Vickki Hearing, BA, ACSM CEP, Exercise Physiologist;Carroll Enterkin, RN, BSN    Supervising physician immediately available to respond to emergencies  See telemetry face sheet for immediately available ER MD    Medication changes reported      No    Fall or balance concerns reported     No    Warm-up and Cool-down  Performed on first and last piece of equipment    Resistance Training Performed  Yes    VAD Patient?  No      Pain Assessment   Currently in Pain?  No/denies        Exercise Prescription Changes - 03/28/18 1500      Response to Exercise   Blood Pressure (Admit)  142/80    Blood Pressure (Exercise)  124/84    Blood Pressure (Exit)  142/80    Heart Rate (Admit)  84 bpm    Heart Rate (Exercise)  101 bpm    Heart Rate (Exit)  75 bpm    Rating of Perceived Exertion (Exercise)  15    Duration  Continue with 45 min of aerobic exercise without signs/symptoms of physical distress.    Intensity  THRR unchanged      Progression   Progression  Continue to progress workloads to maintain intensity without signs/symptoms of physical distress.    Average METs  2.3      Resistance Training   Training Prescription  Yes    Weight  2 lb    Reps  10-15      Interval Training   Interval Training  No      NuStep   Level  1    Minutes  15    METs  2.6      Biostep-RELP   Level  2    Minutes  15    METs  2      Home Exercise Plan   Plans to continue exercise at  Home (comment) walking    Frequency  Add 1 additional day to program exercise sessions.    Initial Home Exercises  Provided  02/20/18       Social History   Tobacco Use  Smoking Status Former Smoker  . Last attempt to quit: 09/13/1998  . Years since quitting: 19.5  Smokeless Tobacco Never Used    Goals Met:  Independence with exercise equipment Exercise tolerated well No report of cardiac concerns or symptoms Strength training completed today  Goals Unmet:  Not Applicable  Comments: Pt able to follow exercise prescription today without complaint.  Will continue to monitor for progression.    Dr. Emily Filbert is Medical Director for [place] and [name].

## 2018-03-29 ENCOUNTER — Encounter: Payer: Medicare Other | Admitting: *Deleted

## 2018-03-29 DIAGNOSIS — Z951 Presence of aortocoronary bypass graft: Secondary | ICD-10-CM

## 2018-03-29 NOTE — Progress Notes (Signed)
Daily Session Note  Patient Details  Name: FRONA YOST MRN: [id number] Date of Birth: 08/11/1961 Referring Provider:     Cardiac Rehab from 12/28/2017 in [facility]  Referring Provider  [name] Date: 03/29/2018  Check In: Session Check In - 03/29/18 1646      Check-In   Location  [facility]    Staff Present  Earlean Shawl, BS, ACSM CEP, Exercise Physiologist;Meredith Sherryll Burger, RN BSN;Laureen Janell Quiet, RRT, Respiratory Therapist    Supervising physician immediately available to respond to emergencies  See telemetry face sheet for immediately available ER MD    Medication changes reported      No    Fall or balance concerns reported     No    Warm-up and Cool-down  Performed on first and last piece of equipment    Resistance Training Performed  Yes    VAD Patient?  No      Pain Assessment   Currently in Pain?  No/denies    Multiple Pain Sites  No          Social History   Tobacco Use  Smoking Status Former Smoker  . Last attempt to quit: 09/13/1998  . Years since quitting: 19.5  Smokeless Tobacco Never Used    Goals Met:  Independence with exercise equipment Exercise tolerated well No report of cardiac concerns or symptoms Strength training completed today  Goals Unmet:  Not Applicable  Comments: Pt able to follow exercise prescription today without complaint.  Will continue to monitor for progression.    Dr. Emily Filbert is Medical Director for [name].

## 2018-04-04 ENCOUNTER — Encounter: Payer: Self-pay | Admitting: *Deleted

## 2018-04-04 DIAGNOSIS — Z951 Presence of aortocoronary bypass graft: Secondary | ICD-10-CM

## 2018-04-04 NOTE — Progress Notes (Signed)
Cardiac Individual Treatment Plan  Patient Details  Name: Kayla Malone MRN: [id number] Date of Birth: Aug 21, 1961 Referring Provider:     Cardiac Rehab from 12/28/2017 in [facility] Cardiac and Pulmonary Rehab  Referring Provider  Humphrey Rolls      Initial Encounter Date:    Cardiac Rehab from 12/28/2017 in [facility] Cardiac and Pulmonary Rehab  Date  12/28/17  Referring Provider  Humphrey Rolls      Visit Diagnosis: S/P CABG x 2  Patient's Home Medications on Admission:  Current Outpatient Medications:  .  acetaminophen (TYLENOL) 500 MG tablet, Take 1,000 mg by mouth every 6 (six) hours as needed for mild pain or headache., Disp: , Rfl:  .  albuterol (PROVENTIL HFA;VENTOLIN HFA) 108 (90 Base) MCG/ACT inhaler, Inhale 2 puffs into the lungs every 6 (six) hours as needed for wheezing or shortness of breath. , Disp: , Rfl:  .  aspirin EC 81 MG tablet, Take 81 mg by mouth daily., Disp: , Rfl:  .  atorvastatin (LIPITOR) 40 MG tablet, Take 40 mg by mouth daily., Disp: , Rfl:  .  buPROPion (ZYBAN) 150 MG 12 hr tablet, Take 150 mg by mouth 2 (two) times daily., Disp: , Rfl: 5 .  fluticasone (FLONASE) 50 MCG/ACT nasal spray, Place 2 sprays into both nostrils daily., Disp: , Rfl:  .  Fluticasone-Salmeterol (ADVAIR DISKUS) 500-50 MCG/DOSE AEPB, Inhale 1 puff into the lungs daily as needed (SOB). , Disp: , Rfl:  .  hydrochlorothiazide (MICROZIDE) 12.5 MG capsule, Take 12.5 mg by mouth., Disp: , Rfl:  .  metFORMIN (GLUCOPHAGE) 500 MG tablet, Take 500 mg by mouth 3 (three) times daily. , Disp: , Rfl: 5 .  metoprolol tartrate (LOPRESSOR) 25 MG tablet, Take 1 tablet (25 mg total) 2 (two) times daily by mouth., Disp: 60 tablet, Rfl: 1 .  mirtazapine (REMERON) 15 MG tablet, TAKE 1 TABLET BY MOUTH EVERYDAY AT BEDTIME, Disp: , Rfl: 2 .  oxyCODONE (OXY IR/ROXICODONE) 5 MG immediate release tablet, Take 1 tablet (5 mg total) every 4 (four) hours as needed by mouth for severe pain. (Patient not taking: Reported on 12/28/2017),  Disp: 30 tablet, Rfl: 0 .  pantoprazole (PROTONIX) 40 MG tablet, Take 40 mg by mouth. , Disp: , Rfl:  .  sitaGLIPtin (JANUVIA) 50 MG tablet, Take 50 mg by mouth daily., Disp: , Rfl:  .  traZODone (DESYREL) 50 MG tablet, Take 50 mg by mouth at bedtime., Disp: , Rfl: 5 .  vitamin B-12 (CYANOCOBALAMIN) 1000 MCG tablet, Take by mouth., Disp: , Rfl:  .  Vitamin D, Ergocalciferol, (DRISDOL) 50000 units CAPS capsule, Take 50,000 Units every 7 (seven) days by mouth. tuesday, Disp: , Rfl:  .  VOLTAREN 1 % GEL, APPLY 2 GRAMS TOPICALLY 4 TIMES DAILY AS NEEDED, Disp: , Rfl: 2  Past Medical History: Past Medical History:  Diagnosis Date  . Asthma   . Coronary artery disease   . Depression   . Diabetes mellitus without complication ([place])   . GERD (gastroesophageal reflux disease)   . History of hiatal hernia   . Hypertension   . Pain    BACK  . Stroke ([place])    x3  . Thyroid cyst     Tobacco Use: Social History   Tobacco Use  Smoking Status Former Smoker  . Last attempt to quit: 09/13/1998  . Years since quitting: 19.5  Smokeless Tobacco Never Used    Labs: Recent Review [facility] for [facility] Cardiac and  Pulmonary Rehab Latest Ref Rng & Units 10/23/2017 10/23/2017 10/23/2017 10/23/2017 10/24/2017   Hemoglobin A1c 4.8 - 5.6 % 7.3(H) - - - -   PHART 7.350 - 7.450 - 7.359 7.402 - -   PCO2ART 32.0 - 48.0 mmHg - 44.0 41.4 - -   HCO3 20.0 - 28.0 mmol/L - 25.1 26.0 - -   TCO2 22 - 32 mmol/L - _0 ACIDBASEDEF 0.0 - 2.0 mmol/L - 1.0 - - -   O2SAT % - 99.0 99.0 - -       Exercise Target Goals:    Exercise Program Goal: Individual exercise prescription set using results from initial 6 min walk test and THRR while considering  patient's activity barriers and safety.   Exercise Prescription Goal: Initial exercise prescription builds to 30-45 minutes a day of aerobic activity, 2-3 days per week.  Home exercise guidelines will be given to patient during program as part of  exercise prescription that the participant will acknowledge.  Activity Barriers & Risk Stratification: Activity Barriers & Cardiac Risk Stratification - 12/28/17 1430      Activity Barriers & Cardiac Risk Stratification   Activity Barriers  Back Problems;Muscular Weakness;Chest Pain/Angina hx of stroke so left sided weakness    Cardiac Risk Stratification  Moderate       6 Minute Walk: 6 Minute Walk    Row Name 12/28/17 1500         6 Minute Walk   Distance  610 feet     Walk Time  6 minutes     # of Rest Breaks  1     MPH  1.15     METS  1.52     RPE  11     Perceived Dyspnea   1     VO2 Peak  5.3     Symptoms  Yes (comment)     Comments  felt like L leg "giving out"  - had stroke     Resting HR  71 bpm     Resting BP  118/82     Resting Oxygen Saturation   99 %     Exercise Oxygen Saturation  during 6 min walk  100 %     Max Ex. HR  101 bpm     Max Ex. BP  150/78     2 Minute Post BP  148/78        Oxygen Initial Assessment:   Oxygen Re-Evaluation:   Oxygen Discharge (Final Oxygen Re-Evaluation):   Initial Exercise Prescription: Initial Exercise Prescription - 12/28/17 1500      Date of Initial Exercise RX and Referring Provider   Date  12/28/17    Referring Provider  Humphrey Rolls      Recumbant Bike   Level  1    RPM  60    Minutes  15    METs  1.5      NuStep   Level  1    SPM  80    Minutes  15    METs  1.5      Biostep-RELP   Level  1    SPM  50    Minutes  15    METs  2      Track   Laps  15    Minutes  15    METs  1.5      Prescription Details   Frequency (times per week)  3    Duration  Progress to 45 minutes of aerobic exercise without signs/symptoms of physical distress      Intensity   THRR 40-80% of Max Heartrate  107-145    Ratings of Perceived Exertion  11-13    Perceived Dyspnea  0-4      Resistance Training   Training Prescription  Yes    Weight  2 lb    Reps  10-15       Perform Capillary Blood Glucose checks as  needed.  Exercise Prescription Changes: Exercise Prescription Changes    Row Name 12/28/17 1400 01/30/18 1500 02/13/18 1400 02/20/18 0900 02/27/18 1400     Response to Exercise   Blood Pressure (Admit)  118/82  114/72  128/70  -  122/62   Blood Pressure (Exercise)  150/78  134/72  128/64  -  112/66   Blood Pressure (Exit)  130/78  138/80  122/72  -  114/66   Heart Rate (Admit)  80 bpm  70 bpm  77 bpm  -  81 bpm   Heart Rate (Exercise)  87 bpm  115 bpm  119 bpm  -  102 bpm   Heart Rate (Exit)  68 bpm  66 bpm  72 bpm  -  68 bpm   Oxygen Saturation (Admit)  99 %  -  -  -  -   Oxygen Saturation (Exit)  100 %  -  -  -  -   Rating of Perceived Exertion (Exercise)  '11  13  13  '$ -  13   Symptoms  -  fatigue  fatigue  -  fatigue   Comments  -  second full day of exercise  -  -  -   Duration  -  Progress to 45 minutes of aerobic exercise without signs/symptoms of physical distress  Continue with 30 min of aerobic exercise without signs/symptoms of physical distress.  -  Continue with 30 min of aerobic exercise without signs/symptoms of physical distress.   Intensity  -  THRR unchanged  THRR unchanged  -  THRR unchanged     Progression   Progression  -  Continue to progress workloads to maintain intensity without signs/symptoms of physical distress.  Continue to progress workloads to maintain intensity without signs/symptoms of physical distress.  -  Continue to progress workloads to maintain intensity without signs/symptoms of physical distress.   Average METs  -  1.61  1.65  -  2.26     Resistance Training   Training Prescription  -  Yes  Yes  -  Yes   Weight  -  2 lb  2 lbs  -  2 lbs   Reps  -  10-15  10-15  -  10-15     Interval Training   Interval Training  -  No  No  -  No     NuStep   Level  -  1  1  -  1   Minutes  -  15  15  -  15   METs  -  1.3  1.3  -  2.4     Biostep-RELP   Level  -  -  -  -  2   Minutes  -  -  -  -  15   METs  -  -  -  -  2     Track   Laps  -  20  21  -   30   Minutes  -  15  15  -  15   METs  -  1.92  2  -  2.38     Home Exercise Plan   Plans to continue exercise at  -  -  -  Home (comment) walking  Home (comment) walking   Frequency  -  -  -  Add 1 additional day to program exercise sessions.  Add 1 additional day to program exercise sessions.   Initial Home Exercises Provided  -  -  -  02/20/18  02/20/18   Row Name 03/28/18 1500             Response to Exercise   Blood Pressure (Admit)  142/80       Blood Pressure (Exercise)  124/84       Blood Pressure (Exit)  142/80       Heart Rate (Admit)  84 bpm       Heart Rate (Exercise)  101 bpm       Heart Rate (Exit)  75 bpm       Rating of Perceived Exertion (Exercise)  15       Duration  Continue with 45 min of aerobic exercise without signs/symptoms of physical distress.       Intensity  THRR unchanged         Progression   Progression  Continue to progress workloads to maintain intensity without signs/symptoms of physical distress.       Average METs  2.3         Resistance Training   Training Prescription  Yes       Weight  2 lb       Reps  10-15         Interval Training   Interval Training  No         NuStep   Level  1       Minutes  15       METs  2.6         Biostep-RELP   Level  2       Minutes  15       METs  2         Home Exercise Plan   Plans to continue exercise at  Home (comment) walking       Frequency  Add 1 additional day to program exercise sessions.       Initial Home Exercises Provided  02/20/18          Exercise Comments: Exercise Comments    Row Name 01/23/18 0941           Exercise Comments   First full day of exercise!  Patient was oriented to gym and equipment including functions, settings, policies, and procedures.  Patient's individual exercise prescription and treatment plan were reviewed.  All starting workloads were established based on the results of the 6 minute walk test done at initial orientation visit.  The plan for  exercise progression was also introduced and progression will be customized based on patient's performance and goals.          Exercise Goals and Review: Exercise Goals    Row Name 12/28/17 1459             Exercise Goals   Increase Physical Activity  Yes       Intervention  Provide advice, education, support and counseling about physical activity/exercise needs.;Develop an individualized exercise prescription for aerobic and resistive training based on initial evaluation findings,  risk stratification, comorbidities and participant's personal goals.       Expected Outcomes  Short Term: Attend rehab on a regular basis to increase amount of physical activity.;Long Term: Add in home exercise to make exercise part of routine and to increase amount of physical activity.;Long Term: Exercising regularly at least 3-5 days a week.       Increase Strength and Stamina  Yes       Intervention  Provide advice, education, support and counseling about physical activity/exercise needs.;Develop an individualized exercise prescription for aerobic and resistive training based on initial evaluation findings, risk stratification, comorbidities and participant's personal goals.       Expected Outcomes  Short Term: Increase workloads from initial exercise prescription for resistance, speed, and METs.;Short Term: Perform resistance training exercises routinely during rehab and add in resistance training at home;Long Term: Improve cardiorespiratory fitness, muscular endurance and strength as measured by increased METs and functional capacity (6MWT)       Able to understand and use rate of perceived exertion (RPE) scale  Yes       Intervention  Provide education and explanation on how to use RPE scale       Expected Outcomes  Short Term: Able to use RPE daily in rehab to express subjective intensity level;Long Term:  Able to use RPE to guide intensity level when exercising independently       Able to understand and use  Dyspnea scale  Yes       Intervention  Provide education and explanation on how to use Dyspnea scale       Expected Outcomes  Short Term: Able to use Dyspnea scale daily in rehab to express subjective sense of shortness of breath during exertion;Long Term: Able to use Dyspnea scale to guide intensity level when exercising independently       Knowledge and understanding of Target Heart Rate Range (THRR)  Yes       Intervention  Provide education and explanation of THRR including how the numbers were predicted and where they are located for reference       Expected Outcomes  Short Term: Able to state/look up THRR;Short Term: Able to use daily as guideline for intensity in rehab;Long Term: Able to use THRR to govern intensity when exercising independently       Able to check pulse independently  Yes       Intervention  Provide education and demonstration on how to check pulse in carotid and radial arteries.;Review the importance of being able to check your own pulse for safety during independent exercise       Expected Outcomes  Short Term: Able to explain why pulse checking is important during independent exercise;Long Term: Able to check pulse independently and accurately       Understanding of Exercise Prescription  Yes       Intervention  Provide education, explanation, and written materials on patient's individual exercise prescription       Expected Outcomes  Short Term: Able to explain program exercise prescription;Long Term: Able to explain home exercise prescription to exercise independently          Exercise Goals Re-Evaluation : Exercise Goals Re-Evaluation    Row Name 01/23/18 0941 01/30/18 1513 02/13/18 1449 02/20/18 0920 03/13/18 1458     Exercise Goal Re-Evaluation   Exercise Goals Review  Increase Physical Activity;Increase Strength and Stamina;Knowledge and understanding of Target Heart Rate Range (THRR);Able to understand and use rate of perceived exertion (RPE) scale  Increase  Physical Activity;Understanding of Exercise Prescription;Increase Strength and Stamina  Understanding of Exercise Prescription;Increase Physical Activity;Increase Strength and Stamina  Understanding of Exercise Prescription;Increase Physical Activity;Increase Strength and Stamina;Knowledge and understanding of Target Heart Rate Range (THRR);Able to check pulse independently;Able to understand and use rate of perceived exertion (RPE) scale  -   Comments  Reviewed RPE scale, THR and program prescription with pt today.  Pt voiced understanding and was given a copy of goals to take home.   Taren has completed two full days of exercise.  She is still building up her stamina.  She missed today as she overslept and will aim for Thursday.  We will continue to monitor her progression.   Arpita is doing well in rehab.  She does come late, but she is still coming.  She is now up to 21 laps on the track.  We will continue to monitor her progression.   Elodie has been doing well in rehab. She is able to do more in class and getting her full 20 min on the machines. Reviewed home exercise with pt today.  Pt plans to walk at home and stores for exercise.  Reviewed THR, pulse, RPE, sign and symptoms, and when to call 911 or MD.  Also discussed weather considerations and indoor options.  Pt voiced understanding.  Out since last review.   Expected Outcomes  Short: Use RPE daily to regulate intensity.  Long: Follow program prescription in THR.  Short: Attend rehab regularly.  Long: Continue to follow program prescription.   Short: Review home exercise and encourage attendance to class and to [facility].  Long: Continue to work on An[employer].   Short: Start to add in 1 extra day of walking at home.   Long: Continue to build strength and stamina.   -   Row Name 03/28/18 1517             Exercise Goal Re-Evaluation   Exercise Goals Review  Increase Physical Activity;Able to understand and use rate of perceived  exertion (RPE) scale;Increase Strength and Stamina       Comments  Pt has been more consistent attending afternoon class.  Staff will monitor progress - she is limited by residual stroke s/s       Expected Outcomes  Short - Pt will attend consistently Long - Pt will improve overall MET level          Discharge Exercise Prescription (Final Exercise Prescription Changes): Exercise Prescription Changes - 03/28/18 1500      Response to Exercise   Blood Pressure (Admit)  142/80    Blood Pressure (Exercise)  124/84    Blood Pressure (Exit)  142/80    Heart Rate (Admit)  84 bpm    Heart Rate (Exercise)  101 bpm    Heart Rate (Exit)  75 bpm    Rating of Perceived Exertion (Exercise)  15    Duration  Continue with 45 min of aerobic exercise without signs/symptoms of physical distress.    Intensity  THRR unchanged      Progression   Progression  Continue to progress workloads to maintain intensity without signs/symptoms of physical distress.    Average METs  2.3      Resistance Training   Training Prescription  Yes    Weight  2 lb    Reps  10-15      Interval Training   Interval Training  No      NuStep   Level  1    Minutes  15    METs  2.6      Biostep-RELP   Level  2    Minutes  15    METs  2      Home Exercise Plan   Plans to continue exercise at  Home (comment) walking    Frequency  Add 1 additional day to program exercise sessions.    Initial Home Exercises Provided  02/20/18       Nutrition:  Target Goals: Understanding of nutrition guidelines, daily intake of sodium '1500mg'$ , cholesterol '200mg'$ , calories 30% from fat and 7% or less from saturated fats, daily to have 5 or more servings of fruits and vegetables.  Biometrics: Pre Biometrics - 12/28/17 1458      Pre Biometrics   Height  5' 4.5" (1.638 m)    Weight  149 lb 1.6 oz (67.6 kg)    Waist Circumference  34.5 inches    Hip Circumference  39.5 inches    Waist to Hip Ratio  0.87 %    BMI (Calculated)  25.21         Nutrition Therapy Plan and Nutrition Goals:   Nutrition Assessments: Nutrition Assessments - 12/28/17 1410      MEDFICTS Scores   Pre Score  -- Will fill out when appetite returns       Nutrition Goals Re-Evaluation: Nutrition Goals Re-Evaluation    [place] Name 02/20/18 0914             Goals   Nutrition Goal  Meet with dietician to review diet tweaks for weight loss       Comment  Scheduled appointment with Lattie Haw to talk with [facility] during class.  She is starting to get her appetite back and would like some help with weightloss.        Expected Outcome  Short: meet with dietician.  Long: Continue to follow recommendations.           Nutrition Goals Discharge (Final Nutrition Goals Re-Evaluation): Nutrition Goals Re-Evaluation - 02/20/18 0914      Goals   Nutrition Goal  Meet with dietician to review diet tweaks for weight loss    Comment  Scheduled appointment with Lattie Haw to talk with [facility] during class.  She is starting to get her appetite back and would like some help with weightloss.     Expected Outcome  Short: meet with dietician.  Long: Continue to follow recommendations.        Psychosocial: Target Goals: Acknowledge presence or absence of significant depression and/or stress, maximize coping skills, provide positive support system. Participant is able to verbalize types and ability to use techniques and skills needed for reducing stress and depression.   Initial Review & Psychosocial Screening: Initial Psych Review & Screening - 12/28/17 1425      Initial Review   Current issues with  History of Depression;Current Stress Concerns    Source of Stress Concerns  Poor Coping Skills;Unable to participate in former interests or hobbies    Comments  Freddye has little drive to do much around the house or to get healthier.       Family Dynamics   Good Support System?  No one daughter did accompany her to Med Review and also cooks some for her, but Lemoyne states she  doesn't feel she has a good support system    Strains  Intra-family strains      Screening Interventions   Interventions  Encouraged to exercise;Program counselor  consult;To provide support and resources with identified psychosocial needs    Expected Outcomes  Short Term goal: Utilizing psychosocial counselor, staff and physician to assist with identification of specific Stressors or current issues interfering with healing process. Setting desired goal for each stressor or current issue identified.;Long Term Goal: Stressors or current issues are controlled or eliminated.;Short Term goal: Identification and review with participant of any Quality of Life or Depression concerns found by scoring the questionnaire.;Long Term goal: The participant improves quality of Life and PHQ9 Scores as seen by post scores and/or verbalization of changes       Quality of Life Scores:  Quality of Life - 12/28/17 1428      Quality of Life Scores   Health/Function Pre  20.2 %    Socioeconomic Pre  19.43 %    Psych/Spiritual Pre  20.86 %    Family Pre  20.6 %    GLOBAL Pre  20.24 %      Scores of 19 and below usually indicate a poorer quality of life in these areas.  A difference of  2-3 points is a clinically meaningful difference.  A difference of 2-3 points in the total score of the Quality of Life Index has been associated with significant improvement in overall quality of life, self-image, physical symptoms, and general health in studies assessing change in quality of life.  PHQ-9: Recent Review Flowsheet Data    Depression screen [facility] 2/9 12/28/2017   Decreased Interest 2   Down, Depressed, Hopeless 2   PHQ - 2 Score 4   Altered sleeping 2   Tired, decreased energy 2   Change in appetite 3   Feeling bad or failure about yourself  0   Trouble concentrating 1   Moving slowly or fidgety/restless 0   Suicidal thoughts 0   PHQ-9 Score 12   Difficult doing work/chores Not difficult at all      Interpretation of Total Score  Total Score Depression Severity:  1-4 = Minimal depression, 5-9 = Mild depression, 10-14 = Moderate depression, 15-19 = Moderately severe depression, 20-27 = Severe depression   Psychosocial Evaluation and Intervention: Psychosocial Evaluation - 01/25/18 0949      Psychosocial Evaluation & Interventions   Interventions  Stress management education;Relaxation education;Encouraged to exercise with the program and follow exercise prescription    Comments  Counselor met with Ms. Keturah Barre Stanton Kidney) today for initial psychosocial evaluation.  She is a 57 year old who had a CABGx3 mid-November.  She also has a history of a stroke ~6 years ago.  She has a strong support system with (4) adult children locally and is involved in her local church.  Niko states she does not sleep well (maybe 4 hours/average per night) as she has been taken off the medication that helped her sleep better in the past.  She also has just begun a new medication to increase her appetite as she reports losing a "lot of weight" since the surgery.  She reports a history of depression and was on medication for this.  She is clearly depressed currently with a PHQ-9 of "12" indicating moderate depressive symptoms.  Pecolia agrees that she is currently depressed and is "afraid" of exercise or doing anything that will cause another heart attack, so she is isolating and not engaged in activities that were once pleasurable to her.  With her sleep issues; appetite; difficulty concentrating and her negative mood, the counselor recommended Philip speak with her Dr. today about medication for  depression.  She has multiple stressors with her health and $$ as well.  Jacque admitted she does not want to be in this CR program as she is afraid of another heart attack.  Counselor assured her she is in good hands, but the depression and fear is preventing her from making positive choices at this time.  Counselor commended [place] for even being  here - when she doesn't want to, and for at least agreeing to "try" for several weeks before discontinuing this program.  Counselor will follow with Stanton Kidney throughout the course of this program.      Expected Outcomes  Emmalena will benefit from consistent exercise to strengthen her heart and improve her mental health.  She will contact her Dr. Chancy Milroy?) today re: medication for her mood and will take as directed.  Counselor will follow.      Continue Psychosocial Services   Follow up required by counselor       Psychosocial Re-Evaluation: Psychosocial Re-Evaluation    Row Name 02/20/18 0916 03/26/18 1631           Psychosocial Re-Evaluation   Current issues with  Current Depression;Current Stress Concerns  Current Stress Concerns      Comments  Melina talked to her doctor about her depression.  They uped her medication from '30mg'$  to 50 mg to help her eat and depression.   She can't really tell much difference.  She still doesn't feel like doing a whole lot or getting out.  She did attend [facility] last month and is going to try to go today. She is so fearful that something else is going to happen again.  Her fear is limiting her ability to move forward from this event.  We will continue to work with her during rehab.   Pt states she is feeling more comfortable with exercise - still doesn't want to do any at home alone.  Does not want to talk about other stress issues      Expected Outcomes  Short: Go to [facility]  Long: Continue to cope with her depression.  Short - Pt wil continue to attend class Long - Pt will manage stress wiht exercise and techniques learned in class      Interventions  Encouraged to attend Cardiac Rehabilitation for the exercise;Stress management education  Encouraged to attend Cardiac Rehabilitation for the exercise      Continue Psychosocial Services   Follow up required by counselor  Follow up required by staff      Comments  Lack of ambition, does not want to get out, fearful  of something else happening.   -        Initial Review   Source of Stress Concerns  Poor Coping Skills;Unable to participate in former interests or hobbies;Chronic Illness  -         Psychosocial Discharge (Final Psychosocial Re-Evaluation): Psychosocial Re-Evaluation - 03/26/18 1631      Psychosocial Re-Evaluation   Current issues with  Current Stress Concerns    Comments  Pt states she is feeling more comfortable with exercise - still doesn't want to do any at home alone.  Does not want to talk about other stress issues    Expected Outcomes  Short - Pt wil continue to attend class Long - Pt will manage stress wiht exercise and techniques learned in class    Interventions  Encouraged to attend Cardiac Rehabilitation for the exercise    Continue Psychosocial Services   Follow up required by  staff       Vocational Rehabilitation: Provide vocational rehab assistance to qualifying candidates.   Vocational Rehab Evaluation & Intervention: Vocational Rehab - 12/28/17 1429      Initial Vocational Rehab Evaluation & Intervention   Assessment shows need for Vocational Rehabilitation  No       Education: Education Goals: Education classes will be provided on a variety of topics geared toward better understanding of heart health and risk factor modification. Participant will state understanding/return demonstration of topics presented as noted by education test scores.  Learning Barriers/Preferences: Learning Barriers/Preferences - 12/28/17 1429      Learning Barriers/Preferences   Learning Barriers  None    Learning Preferences  Audio;Video       Education Topics:  AED/CPR: - Group verbal and written instruction with the use of models to demonstrate the basic use of the AED with the basic ABC's of resuscitation.   Cardiac Rehab from 03/28/2018 in [facility] Cardiac and Pulmonary Rehab  Date  01/23/18  Educator  SB  Instruction Review Code  1- Verbalizes Understanding      General  Nutrition Guidelines/Fats and Fiber: -Group instruction provided by verbal, written material, models and posters to present the general guidelines for heart healthy nutrition. Gives an explanation and review of dietary fats and fiber.   Controlling Sodium/Reading Food Labels: -Group verbal and written material supporting the discussion of sodium use in heart healthy nutrition. Review and explanation with models, verbal and written materials for utilization of the food label.   Exercise Physiology & General Exercise Guidelines: - Group verbal and written instruction with models to review the exercise physiology of the cardiovascular system and associated critical values. Provides general exercise guidelines with specific guidelines to those with heart or lung disease.    Cardiac Rehab from 03/28/2018 in [facility] Cardiac and Pulmonary Rehab  Date  03/21/18  Educator  [facility]  Instruction Review Code  1- Verbalizes Understanding      Aerobic Exercise & Resistance Training: - Gives group verbal and written instruction on the various components of exercise. Focuses on aerobic and resistive training programs and the benefits of this training and how to safely progress through these programs..   Cardiac Rehab from 03/28/2018 in [facility] Cardiac and Pulmonary Rehab  Date  03/26/18  Educator  [facility]  Instruction Review Code  1- Geologist, engineering, Balance, Mind/Body Relaxation: Provides group verbal/written instruction on the benefits of flexibility and balance training, including mind/body exercise modes such as yoga, pilates and tai chi.  Demonstration and skill practice provided.   Stress and Anxiety: - Provides group verbal and written instruction about the health risks of elevated stress and causes of high stress.  Discuss the correlation between heart/lung disease and anxiety and treatment options. Review healthy ways to manage with stress and anxiety.   Cardiac Rehab from 03/28/2018  in [facility] Cardiac and Pulmonary Rehab  Date  02/13/18  Educator  [facility]  Instruction Review Code  1- Verbalizes Understanding      Depression: - Provides group verbal and written instruction on the correlation between heart/lung disease and depressed mood, treatment options, and the stigmas associated with seeking treatment.   Cardiac Rehab from 03/28/2018 in [facility] Cardiac and Pulmonary Rehab  Date  03/28/18  Educator  [facility]  Instruction Review Code  1- Verbalizes Understanding      Anatomy & Physiology of the Heart: - Group verbal and written instruction and models provide basic cardiac anatomy and physiology,  with the coronary electrical and arterial systems. Review of Valvular disease and Heart Failure   Cardiac Procedures: - Group verbal and written instruction to review commonly prescribed medications for heart disease. Reviews the medication, class of the drug, and side effects. Includes the steps to properly store meds and maintain the prescription regimen. (beta blockers and nitrates)   Cardiac Medications I: - Group verbal and written instruction to review commonly prescribed medications for heart disease. Reviews the medication, class of the drug, and side effects. Includes the steps to properly store meds and maintain the prescription regimen.   Cardiac Rehab from 03/28/2018 in [facility] Cardiac and Pulmonary Rehab  Date  02/20/18  Educator  SB  Instruction Review Code  1- Verbalizes Understanding      Cardiac Medications II: -Group verbal and written instruction to review commonly prescribed medications for heart disease. Reviews the medication, class of the drug, and side effects. (all other drug classes)   Cardiac Rehab from 03/28/2018 in [facility] Cardiac and Pulmonary Rehab  Date  02/15/18  Educator  [facility]  Instruction Review Code  1- Verbalizes Understanding       Go Sex-Intimacy & Heart Disease, Get SMART - Goal Setting: - Group verbal and written instruction through game format  to discuss heart disease and the return to sexual intimacy. Provides group verbal and written material to discuss and apply goal setting through the application of the S.M.A.R.T. Method.   Other Matters of the Heart: - Provides group verbal, written materials and models to describe Stable Angina and Peripheral Artery. Includes description of the disease process and treatment options available to the cardiac patient.   Exercise & Equipment Safety: - Individual verbal instruction and demonstration of equipment use and safety with use of the equipment.   Cardiac Rehab from 03/28/2018 in [facility] Cardiac and Pulmonary Rehab  Date  12/28/17  Educator  [facility]  Instruction Review Code  1- Verbalizes Understanding      Infection Prevention: - Provides verbal and written material to individual with discussion of infection control including proper hand washing and proper equipment cleaning during exercise session.   Cardiac Rehab from 03/28/2018 in [facility] Cardiac and Pulmonary Rehab  Date  12/28/17  Educator  [facility]  Instruction Review Code  1- Verbalizes Understanding      Falls Prevention: - Provides verbal and written material to individual with discussion of falls prevention and safety.   Cardiac Rehab from 03/28/2018 in [facility] Cardiac and Pulmonary Rehab  Date  12/28/17  Educator  [facility]  Instruction Review Code  1- Verbalizes Understanding      Diabetes: - Individual verbal and written instruction to review signs/symptoms of diabetes, desired ranges of glucose level fasting, after meals and with exercise. Acknowledge that pre and post exercise glucose checks will be done for 3 sessions at entry of program.   Cardiac Rehab from 03/28/2018 in [facility] Cardiac and Pulmonary Rehab  Date  12/28/17  Educator  [facility]  Instruction Review Code  1- Verbalizes Understanding      Know Your Numbers and Risk Factors: -Group verbal and written instruction about important numbers in your health.  Discussion of what are risk  factors and how they play a role in the disease process.  Review of Cholesterol, Blood Pressure, Diabetes, and BMI and the role they play in your overall health.   Cardiac Rehab from 03/28/2018 in [facility] Cardiac and Pulmonary Rehab  Date  02/15/18  Educator  [facility]  Instruction Review Code  1- Verbalizes Understanding  Sleep Hygiene: -Provides group verbal and written instruction about how sleep can affect your health.  Define sleep hygiene, discuss sleep cycles and impact of sleep habits. Review good sleep hygiene tips.    Cardiac Rehab from 03/28/2018 in [facility] Cardiac and Pulmonary Rehab  Date  02/27/18  Educator  [facility]  Instruction Review Code  1- Verbalizes Understanding      Other: -Provides group and verbal instruction on various topics (see comments)   Knowledge Questionnaire Score: Knowledge Questionnaire Score - 12/28/17 1429      Knowledge Questionnaire Score   Pre Score  19/28 correct answers reviewed with [facility]       Core Components/Risk Factors/Patient Goals at Admission: Personal Goals and Risk Factors at Admission - 12/28/17 1351      Core Components/Risk Factors/Patient Goals on Admission    Weight Management  Yes;Weight Maintenance    Intervention  Weight Management: Develop a combined nutrition and exercise program designed to reach desired caloric intake, while maintaining appropriate intake of nutrient and fiber, sodium and fats, and appropriate energy expenditure required for the weight goal.;Weight Management: Provide education and appropriate resources to help participant work on and attain dietary goals.    Admit Weight  135 lb (61.2 kg) Kodi reports having a no desire to eat and has been losing weight without trying    Expected Outcomes  Short Term: Continue to assess and modify interventions until short term weight is achieved;Long Term: Adherence to nutrition and physical activity/exercise program aimed toward attainment of established weight goal;Weight  Maintenance: Understanding of the daily nutrition guidelines, which includes 25-35% calories from fat, 7% or less cal from saturated fats, less than '200mg'$  cholesterol, less than 1.5gm of sodium, & 5 or more servings of fruits and vegetables daily;Understanding recommendations for meals to include 15-35% energy as protein, 25-35% energy from fat, 35-60% energy from carbohydrates, less than '200mg'$  of dietary cholesterol, 20-35 gm of total fiber daily;Understanding of distribution of calorie intake throughout the day with the consumption of 4-5 meals/snacks    Diabetes  Yes    Intervention  Provide education about signs/symptoms and action to take for hypo/hyperglycemia.;Provide education about proper nutrition, including hydration, and aerobic/resistive exercise prescription along with prescribed medications to achieve blood glucose in normal ranges: Fasting glucose 65-99 mg/dL    Expected Outcomes  Long Term: Attainment of HbA1C < 7%.;Short Term: Participant verbalizes understanding of the signs/symptoms and immediate care of hyper/hypoglycemia, proper foot care and importance of medication, aerobic/resistive exercise and nutrition plan for blood glucose control.    Hypertension  Yes    Intervention  Provide education on lifestyle modifcations including regular physical activity/exercise, weight management, moderate sodium restriction and increased consumption of fresh fruit, vegetables, and low fat dairy, alcohol moderation, and smoking cessation.;Monitor prescription use compliance.    Expected Outcomes  Short Term: Continued assessment and intervention until BP is < 140/76m HG in hypertensive participants. < 130/874mHG in hypertensive participants with diabetes, heart failure or chronic kidney disease.;Long Term: Maintenance of blood pressure at goal levels.    Lipids  Yes    Intervention  Provide education and support for participant on nutrition & aerobic/resistive exercise along with prescribed  medications to achieve LDL '70mg'$ , HDL >'40mg'$ .    Expected Outcomes  Short Term: Participant states understanding of desired cholesterol values and is compliant with medications prescribed. Participant is following exercise prescription and nutrition guidelines.;Long Term: Cholesterol controlled with medications as prescribed, with individualized exercise RX and with personalized nutrition plan. Value goals:  LDL < '70mg'$ , HDL > 40 mg.       Core Components/Risk Factors/Patient Goals Review:  Goals and Risk Factor Review    Row Name 02/20/18 0907 03/26/18 1628           Core Components/Risk Factors/Patient Goals Review   Personal Goals Review  Weight Management/Obesity;Diabetes;Hypertension;Lipids  Weight Management/Obesity;Diabetes;Hypertension;Lipids      Review  Jerilynn has been doing well in rehab.  Her weight was up three pounds today but she was not sure why, possibly more clothes trying to to stay warm.  Blood sugars have been pretty good but some days are higher than others.  She has started to notice a pattern of how she eats and her sugars.   Her blood pressures have good in class.  She does not check it at home unless she doesn't feel good.  No problems with her medications other than her skin reaction.  She has a yeast infection under breast and arms.  She is using a powder and was just starteing a new cream for it.  Pt has been taking all meds as directed.  She has been eating more so BG has been up.  She hasn't met with RD yet. (just switched class times)      Expected Outcomes  Short: Continue to work on weight loss.  Long: Continue to monitor her blood sugars more closely.   Short - schedule time with RD to meet Long - use healthy diet to help control BG and heart health         Core Components/Risk Factors/Patient Goals at Discharge (Final Review):  Goals and Risk Factor Review - 03/26/18 1628      Core Components/Risk Factors/Patient Goals Review   Personal Goals Review  Weight  Management/Obesity;Diabetes;Hypertension;Lipids    Review  Pt has been taking all meds as directed.  She has been eating more so BG has been up.  She hasn't met with RD yet. (just switched class times)    Expected Outcomes  Short - schedule time with RD to meet Long - use healthy diet to help control BG and heart health       ITP Comments: ITP Comments    Row Name 12/28/17 1224 01/10/18 0606 01/16/18 1036 02/07/18 0617 03/07/18 2951   ITP Comments  Med Review completed. Initial ITP created. Diagnosis can be found in [facility] 10/26/17  30 Day review. Continue with ITP unless directed changes per Medical Director review.   Sessions start 2/5  Maja was scheduled to start rehab today and did not attend.  Called to check on her.  Her husband answered the mobile number and said he was at work and would have her call us.    30 day review. Continue with ITP unless directed changes per Medical Director review.  3 visits this month  30 Day review. Continue with ITP unless directed changes per Medical Director review.  Nelle called yesterday to let us know she had fallen and was to see her MD about her injury   [place] Name 03/13/18 1457 03/13/18 1527 03/26/18 1634 04/04/18 0621     ITP Comments  Called to check on status of return.  We had a note that she had fallen at home last week. She has not been back since then.  The note was not dated. She answered first call to home number and lost connection, when I called back she did not answer and I was not able to leave a message.  Stanton Kidney  called back.  She is hoping to return on Thursday.  She had slipped in the bathroom.  She is still a little sore in her hip, but her no pain in her chest. She forgot to set her alarm this morning.   Damonie had a stress test - said results were good but she was told she has 3 leaky valves.  Will f/u with Dr.  30 day review. Continue with ITP unless directed changes per Medical Director       Comments:

## 2018-04-05 DIAGNOSIS — Z951 Presence of aortocoronary bypass graft: Secondary | ICD-10-CM | POA: Diagnosis not present

## 2018-04-05 NOTE — Progress Notes (Signed)
Daily Session Note  Patient Details  Name: Kayla Malone MRN: [id number] Date of Birth: 02-26-1961 Referring Provider:     Cardiac Rehab from 12/28/2017 in [facility] Cardiac and Pulmonary Rehab  Referring Provider  [name] Date: 04/05/2018  Check In: Session Check In - 04/05/18 1649      Check-In   Location  [facility]-Cardiac & Pulmonary Rehab    Staff Present  Earlean Shawl, BS, ACSM CEP, Exercise Physiologist;Meredith Sherryll Burger, RN BSN;Cardelia Sassano Flavia Shipper    Supervising physician immediately available to respond to emergencies  See telemetry face sheet for immediately available ER MD    Medication changes reported      No    Fall or balance concerns reported     No    Tobacco Cessation  No Change    Warm-up and Cool-down  Performed on first and last piece of equipment    Resistance Training Performed  Yes    VAD Patient?  No      Pain Assessment   Currently in Pain?  No/denies          Social History   Tobacco Use  Smoking Status Former Smoker  . Last attempt to quit: 09/13/1998  . Years since quitting: 19.5  Smokeless Tobacco Never Used    Goals Met:  Independence with exercise equipment Exercise tolerated well No report of cardiac concerns or symptoms Strength training completed today  Goals Unmet:  Not Applicable  Comments: Pt able to follow exercise prescription today without complaint.  Will continue to monitor for progression.   Dr. Emily Filbert is Medical Director for [place] and [name].

## 2018-04-06 ENCOUNTER — Ambulatory Visit
Admission: RE | Admit: 2018-04-06 | Discharge: 2018-04-06 | Disposition: A | Discharge status: Home / Self Care | Payer: Medicare Other | Source: Ambulatory Visit | Attending: Internal Medicine | Admitting: Internal Medicine

## 2018-04-06 DIAGNOSIS — Z1231 Encounter for screening mammogram for malignant neoplasm of breast: Secondary | ICD-10-CM | POA: Diagnosis not present

## 2018-04-06 DIAGNOSIS — Z1239 Encounter for other screening for malignant neoplasm of breast: Secondary | ICD-10-CM

## 2018-04-09 ENCOUNTER — Encounter: Payer: Medicare Other | Admitting: *Deleted

## 2018-04-09 DIAGNOSIS — Z951 Presence of aortocoronary bypass graft: Secondary | ICD-10-CM | POA: Diagnosis not present

## 2018-04-09 NOTE — Progress Notes (Signed)
Daily Session Note  Patient Details  Name: Kayla Malone MRN: [id number] Date of Birth: October 11, 1961 Referring Provider:     Cardiac Rehab from 12/28/2017 in [facility] Cardiac and Pulmonary Rehab  Referring Provider  [name] Date: 04/09/2018  Check In: Session Check In - 04/09/18 1704      Check-In   Location  [facility]-Cardiac & Pulmonary Rehab    Staff Present  Renita Papa, RN Moises Blood, BS, ACSM CEP, Exercise Physiologist;Susanne Bice, RN, BSN, CCRP;Carroll Enterkin, RN, BSN    Supervising physician immediately available to respond to emergencies  See telemetry face sheet for immediately available ER MD    Medication changes reported      No    Fall or balance concerns reported     No    Warm-up and Cool-down  Performed as group-led instruction    Resistance Training Performed  Yes    VAD Patient?  No      Pain Assessment   Currently in Pain?  No/denies          Social History   Tobacco Use  Smoking Status Former Smoker  . Last attempt to quit: 09/13/1998  . Years since quitting: 19.5  Smokeless Tobacco Never Used    Goals Met:  Independence with exercise equipment Exercise tolerated well No report of cardiac concerns or symptoms Strength training completed today  Goals Unmet:  Not Applicable  Comments: Pt able to follow exercise prescription today without complaint.  Will continue to monitor for progression.    Dr. Emily Filbert is Medical Director for [place] and [name].

## 2018-04-11 ENCOUNTER — Encounter: Payer: Medicare Other | Attending: Cardiovascular Disease

## 2018-04-11 DIAGNOSIS — Z951 Presence of aortocoronary bypass graft: Secondary | ICD-10-CM | POA: Insufficient documentation

## 2018-04-11 DIAGNOSIS — Z87891 Personal history of nicotine dependence: Secondary | ICD-10-CM | POA: Insufficient documentation

## 2018-04-16 DIAGNOSIS — Z87891 Personal history of nicotine dependence: Secondary | ICD-10-CM | POA: Diagnosis not present

## 2018-04-16 DIAGNOSIS — Z951 Presence of aortocoronary bypass graft: Secondary | ICD-10-CM | POA: Diagnosis not present

## 2018-04-16 NOTE — Progress Notes (Signed)
Daily Session Note  Patient Details  Name: ALIZIA GREIF MRN: [id number] Date of Birth: 1961-02-04 Referring Provider:     Cardiac Rehab from 12/28/2017 in [facility] Cardiac and Pulmonary Rehab  Referring Provider  [name] Date: 04/16/2018  Check In: Session Check In - 04/16/18 1653      Check-In   Location  [facility]-Cardiac & Pulmonary Rehab    Staff Present  Earlean Shawl, BS, ACSM CEP, Exercise Physiologist;Amanda Oletta Darter, BA, ACSM CEP, Exercise Physiologist;Carroll Enterkin, RN, BSN    Supervising physician immediately available to respond to emergencies  See telemetry face sheet for immediately available ER MD    Medication changes reported      No    Fall or balance concerns reported     No    Warm-up and Cool-down  Performed on first and last piece of equipment    Resistance Training Performed  Yes    VAD Patient?  No      Pain Assessment   Currently in Pain?  No/denies    Multiple Pain Sites  No          Social History   Tobacco Use  Smoking Status Former Smoker  . Last attempt to quit: 09/13/1998  . Years since quitting: 19.6  Smokeless Tobacco Never Used    Goals Met:  Independence with exercise equipment Exercise tolerated well No report of cardiac concerns or symptoms Strength training completed today  Goals Unmet:  Not Applicable  Comments: Pt able to follow exercise prescription today without complaint.  Will continue to monitor for progression.    Dr. Emily Filbert is Medical Director for [place] and [name].

## 2018-04-18 ENCOUNTER — Telehealth: Payer: Self-pay | Admitting: *Deleted

## 2018-04-18 NOTE — Telephone Encounter (Signed)
Kayla Malone called to let us know that she is still on the mend and hopes to return tomorrow.

## 2018-04-19 DIAGNOSIS — Z951 Presence of aortocoronary bypass graft: Secondary | ICD-10-CM

## 2018-04-19 NOTE — Progress Notes (Signed)
Daily Session Note  Patient Details  Name: Kayla Malone MRN: [id number] Date of Birth: 1961/06/08 Referring Provider:     Cardiac Rehab from 12/28/2017 in [facility] Cardiac and Pulmonary Rehab  Referring Provider  [name] Date: 04/19/2018  Check In: Session Check In - 04/19/18 1649      Check-In   Location  [facility]-Cardiac & Pulmonary Rehab    Staff Present  Earlean Shawl, BS, ACSM CEP, Exercise Physiologist;Meredith Sherryll Burger, RN BSN;Karlis Cregg Flavia Shipper    Supervising physician immediately available to respond to emergencies  See telemetry face sheet for immediately available ER MD    Medication changes reported      No    Fall or balance concerns reported     No    Tobacco Cessation  No Change    Warm-up and Cool-down  Performed on first and last piece of equipment    Resistance Training Performed  Yes    VAD Patient?  No      Pain Assessment   Currently in Pain?  No/denies          Social History   Tobacco Use  Smoking Status Former Smoker  . Last attempt to quit: 09/13/1998  . Years since quitting: 19.6  Smokeless Tobacco Never Used    Goals Met:  Independence with exercise equipment Exercise tolerated well No report of cardiac concerns or symptoms Strength training completed today  Goals Unmet:  Not Applicable  Comments: Pt able to follow exercise prescription today without complaint.  Will continue to monitor for progression.   Dr. Emily Filbert is Medical Director for [place] and [name].

## 2018-04-25 ENCOUNTER — Encounter: Payer: Medicare Other | Admitting: *Deleted

## 2018-04-25 DIAGNOSIS — Z951 Presence of aortocoronary bypass graft: Secondary | ICD-10-CM | POA: Diagnosis not present

## 2018-04-25 NOTE — Progress Notes (Signed)
Daily Session Note  Patient Details  Name: Kayla Malone MRN: [id number] Date of Birth: [birth year] Referring Provider:     Cardiac Rehab from 12/28/2017 in [facility] Cardiac and [facility] Rehab  Referring Provider  [name] Date: 04/25/2018  Check In: Session Check In - 04/25/18 1632      Check-In   Location  [facility]-Cardiac & Pulmonary Rehab    Staff Present  Heath Lark, RN, BSN, CCRP;Meredith Sherryll Burger, RN Vickki Hearing, BA, ACSM CEP, Exercise Physiologist    Supervising physician immediately available to respond to emergencies  See telemetry face sheet for immediately available ER MD    Fall or balance concerns reported     No    Tobacco Cessation  No Change    Warm-up and Cool-down  Performed on first and last piece of equipment    Resistance Training Performed  Yes    VAD Patient?  No      Pain Assessment   Currently in Pain?  No/denies          Social History   Tobacco Use  Smoking Status Former Smoker  . Last attempt to quit: 09/13/1998  . Years since quitting: 19.6  Smokeless Tobacco Never Used    Goals Met:  Independence with exercise equipment Exercise tolerated well No report of cardiac concerns or symptoms Strength training completed today  Goals Unmet:  Not Applicable  Comments: Pt able to follow exercise prescription today without complaint.  Will continue to monitor for progression.    Dr. Emily Filbert is Medical Director for [place] and [name].

## 2018-04-26 ENCOUNTER — Encounter: Payer: Medicare Other | Admitting: *Deleted

## 2018-04-26 DIAGNOSIS — Z951 Presence of aortocoronary bypass graft: Secondary | ICD-10-CM

## 2018-04-26 NOTE — Progress Notes (Signed)
Daily Session Note  Patient Details  Name: Kayla Malone MRN: [id number] Date of Birth: 27-Oct-1961 Referring Provider:     Cardiac Rehab from 12/28/2017 in [facility] Re[facility]  Referring Provider  [name] Date: 04/26/2018  Check In: Session Check In - 04/26/18 1710      Check-In   Location  [facility]-Cardiac & Pulmonary Rehab    Staff Present  Carson Myrtle, BS, RRT, Respiratory Therapist;Meredith Sherryll Burger, RN Moises Blood, BS, ACSM CEP, Exercise Physiologist;Amanda Oletta Darter, [place], ACSM CEP, Exercise Physiologist    Supervising physician immediately available to respond to emergencies  See telemetry face sheet for immediately available ER MD    Medication changes reported      No    Fall or balance concerns reported     No    Tobacco Cessation  No Change    Warm-up and Cool-down  Performed on first and last piece of equipment    Resistance Training Performed  Yes    VAD Patient?  No      Pain Assessment   Currently in Pain?  No/denies    Multiple Pain Sites  No        Exercise Prescription Changes - 04/26/18 1500      Response to Exercise   Blood Pressure (Admit)  138/80    Blood Pressure (Exercise)  138/70    Blood Pressure (Exit)  120/68    Heart Rate (Admit)  65 bpm    Heart Rate (Exercise)  114 bpm    Heart Rate (Exit)  67 bpm    Rating of Perceived Exertion (Exercise)  14    Duration  Continue with 45 min of aerobic exercise without signs/symptoms of physical distress.    Intensity  Other (comment)      Progression   Progression  Continue to progress workloads to maintain intensity without signs/symptoms of physical distress.    Average METs  3.3      Resistance Training   Training Prescription  Yes    Weight  2 lb    Reps  10-15      Interval Training   Interval Training  No      NuStep   Level  2    Minutes  15    METs  3.6      Biostep-RELP   Level  2    Minutes  15    METs  3      Home Exercise Plan   Plans to  continue exercise at  Home (comment) walking    Frequency  Add 1 additional day to program exercise sessions.    Initial Home Exercises Provided  02/20/18       Social History   Tobacco Use  Smoking Status Former Smoker  . Last attempt to quit: 09/13/1998  . Years since quitting: 19.6  Smokeless Tobacco Never Used    Goals Met:  Independence with exercise equipment Exercise tolerated well Personal goals reviewed No report of cardiac concerns or symptoms Strength training completed today  Goals Unmet:  Not Applicable  Comments: Pt able to follow exercise prescription today without complaint.  Will continue to monitor for progression.    Dr. Emily Filbert is Medical Director for [facility] and [name].

## 2018-05-02 ENCOUNTER — Encounter: Payer: Self-pay | Admitting: *Deleted

## 2018-05-02 VITALS — Ht 64.5 in | Wt 153.0 lb

## 2018-05-02 DIAGNOSIS — Z951 Presence of aortocoronary bypass graft: Secondary | ICD-10-CM

## 2018-05-02 NOTE — Progress Notes (Signed)
Cardiac Individual Treatment Plan  Patient Details  Name: Kayla Malone MRN: [id number] Date of Birth: 1961/11/06 Referring Provider:     Cardiac Rehab from 12/28/2017 in [facility]  Referring Provider  Humphrey Rolls      Initial Encounter Date:    Cardiac Rehab from 12/28/2017 in [facility] Cardiac and Pulmonary Rehab  Date  12/28/17  Referring Provider  Humphrey Rolls      Visit Diagnosis: S/P CABG x 2  Patient's Home Medications on Admission:  Current Outpatient Medications:  .  acetaminophen (TYLENOL) 500 MG tablet, Take 1,000 mg by mouth every 6 (six) hours as needed for mild pain or headache., Disp: , Rfl:  .  albuterol (PROVENTIL HFA;VENTOLIN HFA) 108 (90 Base) MCG/ACT inhaler, Inhale 2 puffs into the lungs every 6 (six) hours as needed for wheezing or shortness of breath. , Disp: , Rfl:  .  aspirin EC 81 MG tablet, Take 81 mg by mouth daily., Disp: , Rfl:  .  atorvastatin (LIPITOR) 40 MG tablet, Take 40 mg by mouth daily., Disp: , Rfl:  .  buPROPion (ZYBAN) 150 MG 12 hr tablet, Take 150 mg by mouth 2 (two) times daily., Disp: , Rfl: 5 .  fluticasone (FLONASE) 50 MCG/ACT nasal spray, Place 2 sprays into both nostrils daily., Disp: , Rfl:  .  Fluticasone-Salmeterol (ADVAIR DISKUS) 500-50 MCG/DOSE AEPB, Inhale 1 puff into the lungs daily as needed (SOB). , Disp: , Rfl:  .  hydrochlorothiazide (MICROZIDE) 12.5 MG capsule, Take 12.5 mg by mouth., Disp: , Rfl:  .  metFORMIN (GLUCOPHAGE) 500 MG tablet, Take 500 mg by mouth 3 (three) times daily. , Disp: , Rfl: 5 .  metoprolol tartrate (LOPRESSOR) 25 MG tablet, Take 1 tablet (25 mg total) 2 (two) times daily by mouth., Disp: 60 tablet, Rfl: 1 .  mirtazapine (REMERON) 15 MG tablet, TAKE 1 TABLET BY MOUTH EVERYDAY AT BEDTIME, Disp: , Rfl: 2 .  oxyCODONE (OXY IR/ROXICODONE) 5 MG immediate release tablet, Take 1 tablet (5 mg total) every 4 (four) hours as needed by mouth for severe pain. (Patient not taking: Reported on 12/28/2017),  Disp: 30 tablet, Rfl: 0 .  pantoprazole (PROTONIX) 40 MG tablet, Take 40 mg by mouth. , Disp: , Rfl:  .  sitaGLIPtin (JANUVIA) 50 MG tablet, Take 50 mg by mouth daily., Disp: , Rfl:  .  traZODone (DESYREL) 50 MG tablet, Take 50 mg by mouth at bedtime., Disp: , Rfl: 5 .  vitamin B-12 (CYANOCOBALAMIN) 1000 MCG tablet, Take by mouth., Disp: , Rfl:  .  Vitamin D, Ergocalciferol, (DRISDOL) 50000 units CAPS capsule, Take 50,000 Units every 7 (seven) days by mouth. tuesday, Disp: , Rfl:  .  VOLTAREN 1 % GEL, APPLY 2 GRAMS TOPICALLY 4 TIMES DAILY AS NEEDED, Disp: , Rfl: 2  Past Medical History: Past Medical History:  Diagnosis Date  . Asthma   . Coronary artery disease   . Depression   . Diabetes mellitus without complication ([place])   . GERD (gastroesophageal reflux disease)   . History of hiatal hernia   . Hypertension   . Pain    BACK  . Stroke ([place])    x3  . Thyroid cyst     Tobacco Use: Social History   Tobacco Use  Smoking Status Former Smoker  . Last attempt to quit: 09/13/1998  . Years since quitting: 19.6  Smokeless Tobacco Never Used    Labs: Recent Review [facility] for [facility] Cardiac and  Pulmonary Rehab Latest Ref Rng & Units 10/23/2017 10/23/2017 10/23/2017 10/23/2017 10/24/2017   Hemoglobin A1c 4.8 - 5.6 % 7.3(H) - - - -   PHART 7.350 - 7.450 - 7.359 7.402 - -   PCO2ART 32.0 - 48.0 mmHg - 44.0 41.4 - -   HCO3 20.0 - 28.0 mmol/L - 25.1 26.0 - -   TCO2 22 - 32 mmol/L - _0 ACIDBASEDEF 0.0 - 2.0 mmol/L - 1.0 - - -   O2SAT % - 99.0 99.0 - -       Exercise Target Goals:    Exercise Program Goal: Individual exercise prescription set using results from initial 6 min walk test and THRR while considering  patient's activity barriers and safety.   Exercise Prescription Goal: Initial exercise prescription builds to 30-45 minutes a day of aerobic activity, 2-3 days per week.  Home exercise guidelines will be given to patient during program as part of  exercise prescription that the participant will acknowledge.  Activity Barriers & Risk Stratification: Activity Barriers & Cardiac Risk Stratification - 12/28/17 1430      Activity Barriers & Cardiac Risk Stratification   Activity Barriers  Back Problems;Muscular Weakness;Chest Pain/Angina hx of stroke so left sided weakness    Cardiac Risk Stratification  Moderate       6 Minute Walk: 6 Minute Walk    Row Name 12/28/17 1500         6 Minute Walk   Distance  610 feet     Walk Time  6 minutes     # of Rest Breaks  1     MPH  1.15     METS  1.52     RPE  11     Perceived Dyspnea   1     VO2 Peak  5.3     Symptoms  Yes (comment)     Comments  felt like L leg "giving out"  - had stroke     Resting HR  71 bpm     Resting BP  118/82     Resting Oxygen Saturation   99 %     Exercise Oxygen Saturation  during 6 min walk  100 %     Max Ex. HR  101 bpm     Max Ex. BP  150/78     2 Minute Post BP  148/78        Oxygen Initial Assessment:   Oxygen Re-Evaluation:   Oxygen Discharge (Final Oxygen Re-Evaluation):   Initial Exercise Prescription: Initial Exercise Prescription - 12/28/17 1500      Date of Initial Exercise RX and Referring Provider   Date  12/28/17    Referring Provider  Humphrey Rolls      Recumbant Bike   Level  1    RPM  60    Minutes  15    METs  1.5      NuStep   Level  1    SPM  80    Minutes  15    METs  1.5      Biostep-RELP   Level  1    SPM  50    Minutes  15    METs  2      Track   Laps  15    Minutes  15    METs  1.5      Prescription Details   Frequency (times per week)  3    Duration  Progress to 45 minutes of aerobic exercise without signs/symptoms of physical distress      Intensity   THRR 40-80% of Max Heartrate  107-145    Ratings of Perceived Exertion  11-13    Perceived Dyspnea  0-4      Resistance Training   Training Prescription  Yes    Weight  2 lb    Reps  10-15       Perform Capillary Blood Glucose checks as  needed.  Exercise Prescription Changes: Exercise Prescription Changes    Row Name 12/28/17 1400 01/30/18 1500 02/13/18 1400 02/20/18 0900 02/27/18 1400     Response to Exercise   Blood Pressure (Admit)  118/82  114/72  128/70  -  122/62   Blood Pressure (Exercise)  150/78  134/72  128/64  -  112/66   Blood Pressure (Exit)  130/78  138/80  122/72  -  114/66   Heart Rate (Admit)  80 bpm  70 bpm  77 bpm  -  81 bpm   Heart Rate (Exercise)  87 bpm  115 bpm  119 bpm  -  102 bpm   Heart Rate (Exit)  68 bpm  66 bpm  72 bpm  -  68 bpm   Oxygen Saturation (Admit)  99 %  -  -  -  -   Oxygen Saturation (Exit)  100 %  -  -  -  -   Rating of Perceived Exertion (Exercise)  '11  13  13  '$ -  13   Symptoms  -  fatigue  fatigue  -  fatigue   Comments  -  second full day of exercise  -  -  -   Duration  -  Progress to 45 minutes of aerobic exercise without signs/symptoms of physical distress  Continue with 30 min of aerobic exercise without signs/symptoms of physical distress.  -  Continue with 30 min of aerobic exercise without signs/symptoms of physical distress.   Intensity  -  THRR unchanged  THRR unchanged  -  THRR unchanged     Progression   Progression  -  Continue to progress workloads to maintain intensity without signs/symptoms of physical distress.  Continue to progress workloads to maintain intensity without signs/symptoms of physical distress.  -  Continue to progress workloads to maintain intensity without signs/symptoms of physical distress.   Average METs  -  1.61  1.65  -  2.26     Resistance Training   Training Prescription  -  Yes  Yes  -  Yes   Weight  -  2 lb  2 lbs  -  2 lbs   Reps  -  10-15  10-15  -  10-15     Interval Training   Interval Training  -  No  No  -  No     NuStep   Level  -  1  1  -  1   Minutes  -  15  15  -  15   METs  -  1.3  1.3  -  2.4     Biostep-RELP   Level  -  -  -  -  2   Minutes  -  -  -  -  15   METs  -  -  -  -  2     Track   Laps  -  20  21  -   30   Minutes  -  15  15  -  15   METs  -  1.92  2  -  2.38     Home Exercise Plan   Plans to continue exercise at  -  -  -  Home (comment) walking  Home (comment) walking   Frequency  -  -  -  Add 1 additional day to program exercise sessions.  Add 1 additional day to program exercise sessions.   Initial Home Exercises Provided  -  -  -  02/20/18  02/20/18   Row Name 03/28/18 1500 04/11/18 1200 04/26/18 1500         Response to Exercise   Blood Pressure (Admit)  142/80  146/78  138/80     Blood Pressure (Exercise)  124/84  152/72  138/70     Blood Pressure (Exit)  142/80  146/66  120/68     Heart Rate (Admit)  84 bpm  82 bpm  65 bpm     Heart Rate (Exercise)  101 bpm  92 bpm  114 bpm     Heart Rate (Exit)  75 bpm  69 bpm  67 bpm     Rating of Perceived Exertion (Exercise)  '15  13  14     '$ Duration  Continue with 45 min of aerobic exercise without signs/symptoms of physical distress.  Continue with 45 min of aerobic exercise without signs/symptoms of physical distress.  Continue with 45 min of aerobic exercise without signs/symptoms of physical distress.     Intensity  THRR unchanged  Other (comment) limited in function by previous stroke  Other (comment)       Progression   Progression  Continue to progress workloads to maintain intensity without signs/symptoms of physical distress.  Continue to progress workloads to maintain intensity without signs/symptoms of physical distress.  Continue to progress workloads to maintain intensity without signs/symptoms of physical distress.     Average METs  2.3  2.5  3.3       Resistance Training   Training Prescription  Yes  Yes  Yes     Weight  2 lb  2 lb  2 lb     Reps  10-15  10-15  10-15       Interval Training   Interval Training  No  No  No       NuStep   Level  '1  2  2     '$ Minutes  '15  15  15     '$ METs  2.6  2.5  3.6       Biostep-RELP   Level  2  -  2     Minutes  15  -  15     METs  2  -  3       Track   Minutes  -  15  -        Home Exercise Plan   Plans to continue exercise at  Home (comment) walking  Home (comment) walking  Home (comment) walking     Frequency  Add 1 additional day to program exercise sessions.  Add 1 additional day to program exercise sessions.  Add 1 additional day to program exercise sessions.     Initial Home Exercises Provided  02/20/18  02/20/18  02/20/18        Exercise Comments: Exercise Comments    Row Name 01/23/18 0941           Exercise Comments   First full day  of exercise!  Patient was oriented to gym and equipment including functions, settings, policies, and procedures.  Patient's individual exercise prescription and treatment plan were reviewed.  All starting workloads were established based on the results of the 6 minute walk test done at initial orientation visit.  The plan for exercise progression was also introduced and progression will be customized based on patient's performance and goals.          Exercise Goals and Review: Exercise Goals    Row Name 12/28/17 1459             Exercise Goals   Increase Physical Activity  Yes       Intervention  Provide advice, education, support and counseling about physical activity/exercise needs.;Develop an individualized exercise prescription for aerobic and resistive training based on initial evaluation findings, risk stratification, comorbidities and participant's personal goals.       Expected Outcomes  Short Term: Attend rehab on a regular basis to increase amount of physical activity.;Long Term: Add in home exercise to make exercise part of routine and to increase amount of physical activity.;Long Term: Exercising regularly at least 3-5 days a week.       Increase Strength and Stamina  Yes       Intervention  Provide advice, education, support and counseling about physical activity/exercise needs.;Develop an individualized exercise prescription for aerobic and resistive training based on initial evaluation findings, risk  stratification, comorbidities and participant's personal goals.       Expected Outcomes  Short Term: Increase workloads from initial exercise prescription for resistance, speed, and METs.;Short Term: Perform resistance training exercises routinely during rehab and add in resistance training at home;Long Term: Improve cardiorespiratory fitness, muscular endurance and strength as measured by increased METs and functional capacity (6MWT)       Able to understand and use rate of perceived exertion (RPE) scale  Yes       Intervention  Provide education and explanation on how to use RPE scale       Expected Outcomes  Short Term: Able to use RPE daily in rehab to express subjective intensity level;Long Term:  Able to use RPE to guide intensity level when exercising independently       Able to understand and use Dyspnea scale  Yes       Intervention  Provide education and explanation on how to use Dyspnea scale       Expected Outcomes  Short Term: Able to use Dyspnea scale daily in rehab to express subjective sense of shortness of breath during exertion;Long Term: Able to use Dyspnea scale to guide intensity level when exercising independently       Knowledge and understanding of Target Heart Rate Range (THRR)  Yes       Intervention  Provide education and explanation of THRR including how the numbers were predicted and where they are located for reference       Expected Outcomes  Short Term: Able to state/look up THRR;Short Term: Able to use daily as guideline for intensity in rehab;Long Term: Able to use THRR to govern intensity when exercising independently       Able to check pulse independently  Yes       Intervention  Provide education and demonstration on how to check pulse in carotid and radial arteries.;Review the importance of being able to check your own pulse for safety during independent exercise       Expected Outcomes  Short Term: Able to explain why  pulse checking is important during independent  exercise;Long Term: Able to check pulse independently and accurately       Understanding of Exercise Prescription  Yes       Intervention  Provide education, explanation, and written materials on patient's individual exercise prescription       Expected Outcomes  Short Term: Able to explain program exercise prescription;Long Term: Able to explain home exercise prescription to exercise independently          Exercise Goals Re-Evaluation : Exercise Goals Re-Evaluation    Row Name 01/23/18 0941 01/30/18 1513 02/13/18 1449 02/20/18 0920 03/13/18 1458     Exercise Goal Re-Evaluation   Exercise Goals Review  Increase Physical Activity;Increase Strength and Stamina;Knowledge and understanding of Target Heart Rate Range (THRR);Able to understand and use rate of perceived exertion (RPE) scale  Increase Physical Activity;Understanding of Exercise Prescription;Increase Strength and Stamina  Understanding of Exercise Prescription;Increase Physical Activity;Increase Strength and Stamina  Understanding of Exercise Prescription;Increase Physical Activity;Increase Strength and Stamina;Knowledge and understanding of Target Heart Rate Range (THRR);Able to check pulse independently;Able to understand and use rate of perceived exertion (RPE) scale  -   Comments  Reviewed RPE scale, THR and program prescription with pt today.  Pt voiced understanding and was given a copy of goals to take home.   Kayla Malone has completed two full days of exercise.  She is still building up her stamina.  She missed today as she overslept and will aim for Thursday.  We will continue to monitor her progression.   Kayla Malone is doing well in rehab.  She does come late, but she is still coming.  She is now up to 21 laps on the track.  We will continue to monitor her progression.   Kayla Malone has been doing well in rehab. She is able to do more in class and getting her full 20 min on the machines. Reviewed home exercise with pt today.  Pt plans to walk at home and  stores for exercise.  Reviewed THR, pulse, RPE, sign and symptoms, and when to call 911 or MD.  Also discussed weather considerations and indoor options.  Pt voiced understanding.  Out since last review.   Expected Outcomes  Short: Use RPE daily to regulate intensity.  Long: Follow program prescription in THR.  Short: Attend rehab regularly.  Long: Continue to follow program prescription.   Short: Review home exercise and encourage attendance to class and to [facility].  Long: Continue to work on An[employer]ator.   Short: Start to add in 1 extra day of walking at home.   Long: Continue to build strength and stamina.   -   Row Name 03/28/18 1517 04/11/18 1249 04/26/18 1527         Exercise Goal Re-Evaluation   Exercise Goals Review  Increase Physical Activity;Able to understand and use rate of perceived exertion (RPE) scale;Increase Strength and Stamina  Increase Physical Activity;Able to understand and use rate of perceived exertion (RPE) scale;Increase Strength and Stamina  Increase Physical Activity;Able to understand and use rate of perceived exertion (RPE) scale;Knowledge and understanding of Target Heart Rate Range (THRR);Increase Strength and Stamina     Comments  Pt has been more consistent attending afternoon class.  Staff will monitor progress - she is limited by residual stroke s/s  Kayla Malone has attended more consistently.  She has increased to level 2 on NS.  Staff fell she is making progress with stamina.  Pt has increased overall MET level.  Staff will monitor progess     Expected Outcomes  Short - Pt will attend consistently Long - Pt will improve overall MET level  Short - Alfred will exercise 3 days per week with classes and home exercise  Long - Kayla Malone will continue to exercise on her own   Short - Pt will complete HT  Long - Pt will exercise on her own        Discharge Exercise Prescription (Final Exercise Prescription Changes): Exercise Prescription Changes - 04/26/18 1500       Response to Exercise   Blood Pressure (Admit)  138/80    Blood Pressure (Exercise)  138/70    Blood Pressure (Exit)  120/68    Heart Rate (Admit)  65 bpm    Heart Rate (Exercise)  114 bpm    Heart Rate (Exit)  67 bpm    Rating of Perceived Exertion (Exercise)  14    Duration  Continue with 45 min of aerobic exercise without signs/symptoms of physical distress.    Intensity  Other (comment)      Progression   Progression  Continue to progress workloads to maintain intensity without signs/symptoms of physical distress.    Average METs  3.3      Resistance Training   Training Prescription  Yes    Weight  2 lb    Reps  10-15      Interval Training   Interval Training  No      NuStep   Level  2    Minutes  15    METs  3.6      Biostep-RELP   Level  2    Minutes  15    METs  3      Home Exercise Plan   Plans to continue exercise at  Home (comment) walking    Frequency  Add 1 additional day to program exercise sessions.    Initial Home Exercises Provided  02/20/18       Nutrition:  Target Goals: Understanding of nutrition guidelines, daily intake of sodium '1500mg'$ , cholesterol '200mg'$ , calories 30% from fat and 7% or less from saturated fats, daily to have 5 or more servings of fruits and vegetables.  Biometrics: Pre Biometrics - 12/28/17 1458      Pre Biometrics   Height  5' 4.5" (1.638 m)    Weight  149 lb 1.6 oz (67.6 kg)    Waist Circumference  34.5 inches    Hip Circumference  39.5 inches    Waist to Hip Ratio  0.87 %    BMI (Calculated)  25.21        Nutrition Therapy Plan and Nutrition Goals: Nutrition Therapy & Goals - 04/27/18 1142      Nutrition Therapy   RD appointment deferred  Yes       Nutrition Assessments: Nutrition Assessments - 12/28/17 1410      MEDFICTS Scores   Pre Score  -- Will fill out when appetite returns       Nutrition Goals Re-Evaluation: Nutrition Goals Re-Evaluation    Connell Name 02/20/18 0914              Goals   Nutrition Goal  Meet with dietician to review diet tweaks for weight loss       Comment  Scheduled appointment with Lattie Haw to talk with [facility] during class.  She is starting to get her appetite back and would like some help with weightloss.  Expected Outcome  Short: meet with dietician.  Long: Continue to follow recommendations.           Nutrition Goals Discharge (Final Nutrition Goals Re-Evaluation): Nutrition Goals Re-Evaluation - 02/20/18 0914      Goals   Nutrition Goal  Meet with dietician to review diet tweaks for weight loss    Comment  Scheduled appointment with Lattie Haw to talk with [facility] during class.  She is starting to get her appetite back and would like some help with weightloss.     Expected Outcome  Short: meet with dietician.  Long: Continue to follow recommendations.        Psychosocial: Target Goals: Acknowledge presence or absence of significant depression and/or stress, maximize coping skills, provide positive support system. Participant is able to verbalize types and ability to use techniques and skills needed for reducing stress and depression.   Initial Review & Psychosocial Screening: Initial Psych Review & Screening - 12/28/17 1425      Initial Review   Current issues with  History of Depression;Current Stress Concerns    Source of Stress Concerns  Poor Coping Skills;Unable to participate in former interests or hobbies    Comments  Kayla Malone has little drive to do much around the house or to get healthier.       Family Dynamics   Good Support System?  No one daughter did accompany her to Med Review and also cooks some for her, but Kayla Malone states she doesn't feel she has a good support system    Strains  Intra-family strains      Screening Interventions   Interventions  Encouraged to exercise;Program counselor consult;To provide support and resources with identified psychosocial needs    Expected Outcomes  Short Term goal: Utilizing psychosocial counselor,  staff and physician to assist with identification of specific Stressors or current issues interfering with healing process. Setting desired goal for each stressor or current issue identified.;Long Term Goal: Stressors or current issues are controlled or eliminated.;Short Term goal: Identification and review with participant of any Quality of Life or Depression concerns found by scoring the questionnaire.;Long Term goal: The participant improves quality of Life and PHQ9 Scores as seen by post scores and/or verbalization of changes       Quality of Life Scores:  Quality of Life - 12/28/17 1428      Quality of Life Scores   Health/Function Pre  20.2 %    Socioeconomic Pre  19.43 %    Psych/Spiritual Pre  20.86 %    Family Pre  20.6 %    GLOBAL Pre  20.24 %      Scores of 19 and below usually indicate a poorer quality of life in these areas.  A difference of  2-3 points is a clinically meaningful difference.  A difference of 2-3 points in the total score of the Quality of Life Index has been associated with significant improvement in overall quality of life, self-image, physical symptoms, and general health in studies assessing change in quality of life.  PHQ-9: Recent Review Flowsheet Data    Depression screen [facility] 2/9 12/28/2017   Decreased Interest 2   Down, Depressed, Hopeless 2   PHQ - 2 Score 4   Altered sleeping 2   Tired, decreased energy 2   Change in appetite 3   Feeling bad or failure about yourself  0   Trouble concentrating 1   Moving slowly or fidgety/restless 0   Suicidal thoughts 0   PHQ-9 Score  12   Difficult doing work/chores Not difficult at all     Interpretation of Total Score  Total Score Depression Severity:  1-4 = Minimal depression, 5-9 = Mild depression, 10-14 = Moderate depression, 15-19 = Moderately severe depression, 20-27 = Severe depression   Psychosocial Evaluation and Intervention: Psychosocial Evaluation - 01/25/18 0949      Psychosocial Evaluation  & Interventions   Interventions  Stress management education;Relaxation education;Encouraged to exercise with the program and follow exercise prescription    Comments  Counselor met with Ms. Kayla Malone) today for initial psychosocial evaluation.  She is a 57 year old who had a CABGx3 mid-November.  She also has a history of a stroke ~6 years ago.  She has a strong support system with (4) adult children locally and is involved in her local church.  Kayla Malone states she does not sleep well (maybe 4 hours/average per night) as she has been taken off the medication that helped her sleep better in the past.  She also has just begun a new medication to increase her appetite as she reports losing a "lot of weight" since the surgery.  She reports a history of depression and was on medication for this.  She is clearly depressed currently with a PHQ-9 of "12" indicating moderate depressive symptoms.  Kayla Malone agrees that she is currently depressed and is "afraid" of exercise or doing anything that will cause another heart attack, so she is isolating and not engaged in activities that were once pleasurable to her.  With her sleep issues; appetite; difficulty concentrating and her negative mood, the counselor recommended Kayla Malone speak with her Dr. today about medication for depression.  She has multiple stressors with her health and $$ as well.  Kayla Malone admitted she does not want to be in this CR program as she is afraid of another heart attack.  Counselor assured her she is in good hands, but the depression and fear is preventing her from making positive choices at this time.  Counselor commended Kayla Malone for even being here - when she doesn't want to, and for at least agreeing to "try" for several weeks before discontinuing this program.  Counselor will follow with Stanton Malone throughout the course of this program.      Expected Outcomes  Kayla Malone will benefit from consistent exercise to strengthen her heart and improve her mental health.  She will  contact her Dr. Chancy Malone?) today re: medication for her mood and will take as directed.  Counselor will follow.      Continue Psychosocial Services   Follow up required by counselor       Psychosocial Re-Evaluation: Psychosocial Re-Evaluation    [place] Name 02/20/18 0916 03/26/18 1631 04/27/18 1146         Psychosocial Re-Evaluation   Current issues with  Current Depression;Current Stress Concerns  Current Stress Concerns  Current Stress Concerns;Current Sleep Concerns     Comments  Shadara talked to her doctor about her depression.  They uped her medication from '30mg'$  to 50 mg to help her eat and depression.   She can't really tell much difference.  She still doesn't feel like doing a whole lot or getting out.  She did attend [facility] last month and is going to try to go today. She is so fearful that something else is going to happen again.  Her fear is limiting her ability to move forward from this event.  We will continue to work with her during rehab.   Pt states she  is feeling more comfortable with exercise - still doesn't want to do any at home alone.  Does not want to talk about other stress issues  Pt states she still doesnt sleep well.  She has trouble falling asleep due to her stroke.  She does use trazadone and is trying no soda after 3 pm to see if that helps.  Staff suggested relaxation breathing to help as well.  She reports no other new stress.       Expected Outcomes  Short: Go to [facility]  Long: Continue to cope with her depression.  Short - Pt wil continue to attend class Long - Pt will manage stress wiht exercise and techniques learned in class  Short - Kayla Malone will try relaxation breathing techniques and eliminating late day soda to help her go to sleep  Kayla Malone will develop better sleep  patterns     Interventions  Encouraged to attend Cardiac Rehabilitation for the exercise;Stress management education  Encouraged to attend Cardiac Rehabilitation for the exercise  -     Continue  Psychosocial Services   Follow up required by counselor  Follow up required by staff  -     Comments  Lack of ambition, does not want to get out, fearful of something else happening.   -  -       Initial Review   Source of Stress Concerns  Poor Coping Skills;Unable to participate in former interests or hobbies;Chronic Illness  -  -        Psychosocial Discharge (Final Psychosocial Re-Evaluation): Psychosocial Re-Evaluation - 04/27/18 1146      Psychosocial Re-Evaluation   Current issues with  Current Stress Concerns;Current Sleep Concerns    Comments  Pt states she still doesnt sleep well.  She has trouble falling asleep due to her stroke.  She does use trazadone and is trying no soda after 3 pm to see if that helps.  Staff suggested relaxation breathing to help as well.  She reports no other new stress.      Expected Outcomes  Short - Kayla Malone will try relaxation breathing techniques and eliminating late day soda to help her go to sleep  Long - Kayla Malone will develop better sleep  patterns       Vocational Rehabilitation: Provide vocational rehab assistance to qualifying candidates.   Vocational Rehab Evaluation & Intervention: Vocational Rehab - 12/28/17 1429      Initial Vocational Rehab Evaluation & Intervention   Assessment shows need for Vocational Rehabilitation  No       Education: Education Goals: Education classes will be provided on a variety of topics geared toward better understanding of heart health and risk factor modification. Participant will state understanding/return demonstration of topics presented as noted by education test scores.  Learning Barriers/Preferences: Learning Barriers/Preferences - 12/28/17 1429      Learning Barriers/Preferences   Learning Barriers  None    Learning Preferences  Audio;Video       Education Topics:  AED/CPR: - Group verbal and written instruction with the use of models to demonstrate the basic use of the AED with the basic ABC's  of resuscitation.   Cardiac Rehab from 04/25/2018 in [facility] Cardiac and Pulmonary Rehab  Date  01/23/18  Educator  SB  Instruction Review Code  1- Verbalizes Understanding      General Nutrition Guidelines/Fats and Fiber: -Group instruction provided by verbal, written material, models and posters to present the general guidelines for heart healthy nutrition. Gives an explanation  and review of dietary fats and fiber.   Controlling Sodium/Reading Food Labels: -Group verbal and written material supporting the discussion of sodium use in heart healthy nutrition. Review and explanation with models, verbal and written materials for utilization of the food label.   Exercise Physiology & General Exercise Guidelines: - Group verbal and written instruction with models to review the exercise physiology of the cardiovascular system and associated critical values. Provides general exercise guidelines with specific guidelines to those with heart or lung disease.    Cardiac Rehab from 04/25/2018 in [facility] Cardiac and Pulmonary Rehab  Date  03/21/18  Educator  [facility]  Instruction Review Code  1- Verbalizes Understanding      Aerobic Exercise & Resistance Training: - Gives group verbal and written instruction on the various components of exercise. Focuses on aerobic and resistive training programs and the benefits of this training and how to safely progress through these programs..   Cardiac Rehab from 04/25/2018 in [facility] Cardiac and Pulmonary Rehab  Date  03/26/18  Educator  [facility]  Instruction Review Code  1- Geologist, engineering, Balance, Mind/Body Relaxation: Provides group verbal/written instruction on the benefits of flexibility and balance training, including mind/body exercise modes such as yoga, pilates and tai chi.  Demonstration and skill practice provided.   Stress and Anxiety: - Provides group verbal and written instruction about the health risks of elevated stress and causes  of high stress.  Discuss the correlation between heart/lung disease and anxiety and treatment options. Review healthy ways to manage with stress and anxiety.   Cardiac Rehab from 04/25/2018 in [facility] Cardiac and Pulmonary Rehab  Date  02/13/18  Educator  [facility]  Instruction Review Code  1- Verbalizes Understanding      Depression: - Provides group verbal and written instruction on the correlation between heart/lung disease and depressed mood, treatment options, and the stigmas associated with seeking treatment.   Cardiac Rehab from 04/25/2018 in [facility] Cardiac and Pulmonary Rehab  Date  03/28/18  Educator  [facility]  Instruction Review Code  1- Verbalizes Understanding      Anatomy & Physiology of the Heart: - Group verbal and written instruction and models provide basic cardiac anatomy and physiology, with the coronary electrical and arterial systems. Review of Valvular disease and Heart Failure   Cardiac Rehab from 04/25/2018 in [facility] Cardiac and Pulmonary Rehab  Date  04/09/18  Educator  CE  Instruction Review Code  1- Verbalizes Understanding      Cardiac Procedures: - Group verbal and written instruction to review commonly prescribed medications for heart disease. Reviews the medication, class of the drug, and side effects. Includes the steps to properly store meds and maintain the prescription regimen. (beta blockers and nitrates)   Cardiac Medications I: - Group verbal and written instruction to review commonly prescribed medications for heart disease. Reviews the medication, class of the drug, and side effects. Includes the steps to properly store meds and maintain the prescription regimen.   Cardiac Rehab from 04/25/2018 in [facility] Cardiac and Pulmonary Rehab  Date  04/16/18  Educator  CE  Instruction Review Code  1- Verbalizes Understanding      Cardiac Medications II: -Group verbal and written instruction to review commonly prescribed medications for heart disease. Reviews the medication,  class of the drug, and side effects. (all other drug classes)   Cardiac Rehab from 04/25/2018 in [facility] Cardiac and Pulmonary Rehab  Date  02/15/18  Educator  [facility]  Instruction Review  Code  1- Verbalizes Understanding       Go Sex-Intimacy & Heart Disease, Get SMART - Goal Setting: - Group verbal and written instruction through game format to discuss heart disease and the return to sexual intimacy. Provides group verbal and written material to discuss and apply goal setting through the application of the S.M.A.R.T. Method.   Other Matters of the Heart: - Provides group verbal, written materials and models to describe Stable Angina and Peripheral Artery. Includes description of the disease process and treatment options available to the cardiac patient.   Cardiac Rehab from 04/25/2018 in [facility] Cardiac and Pulmonary Rehab  Date  04/09/18  Educator  CE  Instruction Review Code  1- Verbalizes Understanding      Exercise & Equipment Safety: - Individual verbal instruction and demonstration of equipment use and safety with use of the equipment.   Cardiac Rehab from 04/25/2018 in [facility] Cardiac and Pulmonary Rehab  Date  12/28/17  Educator  [facility]  Instruction Review Code  1- Verbalizes Understanding      Infection Prevention: - Provides verbal and written material to individual with discussion of infection control including proper hand washing and proper equipment cleaning during exercise session.   Cardiac Rehab from 04/25/2018 in [facility] Cardiac and Pulmonary Rehab  Date  12/28/17  Educator  [facility]  Instruction Review Code  1- Verbalizes Understanding      Falls Prevention: - Provides verbal and written material to individual with discussion of falls prevention and safety.   Cardiac Rehab from 04/25/2018 in [facility] Cardiac and Pulmonary Rehab  Date  12/28/17  Educator  [facility]  Instruction Review Code  1- Verbalizes Understanding      Diabetes: - Individual verbal and written instruction to review  signs/symptoms of diabetes, desired ranges of glucose level fasting, after meals and with exercise. Acknowledge that pre and post exercise glucose checks will be done for 3 sessions at entry of program.   Cardiac Rehab from 04/25/2018 in [facility] Cardiac and Pulmonary Rehab  Date  12/28/17  Educator  [facility]  Instruction Review Code  1- Verbalizes Understanding      Know Your Numbers and Risk Factors: -Group verbal and written instruction about important numbers in your health.  Discussion of what are risk factors and how they play a role in the disease process.  Review of Cholesterol, Blood Pressure, Diabetes, and BMI and the role they play in your overall health.   Cardiac Rehab from 04/25/2018 in [facility] Cardiac and Pulmonary Rehab  Date  02/15/18  Educator  [facility]  Instruction Review Code  1- Verbalizes Understanding      Sleep Hygiene: -Provides group verbal and written instruction about how sleep can affect your health.  Define sleep hygiene, discuss sleep cycles and impact of sleep habits. Review good sleep hygiene tips.    Cardiac Rehab from 04/25/2018 in [facility] Cardiac and Pulmonary Rehab  Date  04/25/18  Educator  [facility]  Instruction Review Code  1- Verbalizes Understanding      Other: -Provides group and verbal instruction on various topics (see comments)   Knowledge Questionnaire Score: Knowledge Questionnaire Score - 12/28/17 1429      Knowledge Questionnaire Score   Pre Score  19/28 correct answers reviewed with [facility]       Core Components/Risk Factors/Patient Goals at Admission: Personal Goals and Risk Factors at Admission - 12/28/17 1351      Core Components/Risk Factors/Patient Goals on Admission    Weight Management  Yes;Weight Maintenance  Intervention  Weight Management: Develop a combined nutrition and exercise program designed to reach desired caloric intake, while maintaining appropriate intake of nutrient and fiber, sodium and fats, and appropriate energy expenditure  required for the weight goal.;Weight Management: Provide education and appropriate resources to help participant work on and attain dietary goals.    Admit Weight  135 lb (61.2 kg) Gerldine reports having a no desire to eat and has been losing weight without trying    Expected Outcomes  Short Term: Continue to assess and modify interventions until short term weight is achieved;Long Term: Adherence to nutrition and physical activity/exercise program aimed toward attainment of established weight goal;Weight Maintenance: Understanding of the daily nutrition guidelines, which includes 25-35% calories from fat, 7% or less cal from saturated fats, less than '200mg'$  cholesterol, less than 1.5gm of sodium, & 5 or more servings of fruits and vegetables daily;Understanding recommendations for meals to include 15-35% energy as protein, 25-35% energy from fat, 35-60% energy from carbohydrates, less than '200mg'$  of dietary cholesterol, 20-35 gm of total fiber daily;Understanding of distribution of calorie intake throughout the day with the consumption of 4-5 meals/snacks    Diabetes  Yes    Intervention  Provide education about signs/symptoms and action to take for hypo/hyperglycemia.;Provide education about proper nutrition, including hydration, and aerobic/resistive exercise prescription along with prescribed medications to achieve blood glucose in normal ranges: Fasting glucose 65-99 mg/dL    Expected Outcomes  Long Term: Attainment of HbA1C < 7%.;Short Term: Participant verbalizes understanding of the signs/symptoms and immediate care of hyper/hypoglycemia, proper foot care and importance of medication, aerobic/resistive exercise and nutrition plan for blood glucose control.    Hypertension  Yes    Intervention  Provide education on lifestyle modifcations including regular physical activity/exercise, weight management, moderate sodium restriction and increased consumption of fresh fruit, vegetables, and low fat dairy, alcohol  moderation, and smoking cessation.;Monitor prescription use compliance.    Expected Outcomes  Short Term: Continued assessment and intervention until BP is < 140/53m HG in hypertensive participants. < 130/828mHG in hypertensive participants with diabetes, heart failure or chronic Malone disease.;Long Term: Maintenance of blood pressure at goal levels.    Lipids  Yes    Intervention  Provide education and support for participant on nutrition & aerobic/resistive exercise along with prescribed medications to achieve LDL '70mg'$ , HDL >'40mg'$ .    Expected Outcomes  Short Term: Participant states understanding of desired cholesterol values and is compliant with medications prescribed. Participant is following exercise prescription and nutrition guidelines.;Long Term: Cholesterol controlled with medications as prescribed, with individualized exercise RX and with personalized nutrition plan. Value goals: LDL < '70mg'$ , HDL > 40 mg.       Core Components/Risk Factors/Patient Goals Review:  Goals and Risk Factor Review    Row Name 02/20/18 0930864/15/19 1628 04/27/18 1142         Core Components/Risk Factors/Patient Goals Review   Personal Goals Review  Weight Management/Obesity;Diabetes;Hypertension;Lipids  Weight Management/Obesity;Diabetes;Hypertension;Lipids  Weight Management/Obesity;Lipids;Hypertension;Stress;Diabetes     Review  Kayla Malone been doing well in rehab.  Her weight was up three pounds today but she was not sure why, possibly more clothes trying to to stay warm.  Blood sugars have been pretty good but some days are higher than others.  She has started to notice a pattern of how she eats and her sugars.   Her blood pressures have good in class.  She does not check it at home unless she doesn't feel good.  No problems  with her medications other than her skin reaction.  She has a yeast infection under breast and arms.  She is using a powder and was just starteing a new cream for it.  Pt has been taking  all meds as directed.  She has been eating more so BG has been up.  She hasn't met with RD yet. (just switched class times)  Charmon has been taking all meds as directed.  Her FBG has been between 98-101.  BP has been good at home and at [facility].  She feels she has more energy now than before beginning an exercie rogram.  She does walk and do strength exercises at home.  Staff gave info about [place] to continue exercise whe she completes HT.  She gets cholesterol checked tomorrow.     Expected Outcomes  Short: Continue to work on weight loss.  Long: Continue to monitor her blood sugars more closely.   Short - schedule time with RD to meet Long - use healthy diet to help control BG and heart health  Short - Pt will complete Ht Long - Pt will maintain exercise on her own        Core Components/Risk Factors/Patient Goals at Discharge (Final Review):  Goals and Risk Factor Review - 04/27/18 1142      Core Components/Risk Factors/Patient Goals Review   Personal Goals Review  Weight Management/Obesity;Lipids;Hypertension;Stress;Diabetes    Review  Cyndi has been taking all meds as directed.  Her FBG has been between 98-101.  BP has been good at home and at [facility].  She feels she has more energy now than before beginning an exercie rogram.  She does walk and do strength exercises at home.  Staff gave info about [place] to continue exercise whe she completes HT.  She gets cholesterol checked tomorrow.    Expected Outcomes  Short - Pt will complete Ht Long - Pt will maintain exercise on her own       ITP Comments: ITP Comments    Row Name 12/28/17 1224 01/10/18 0606 01/16/18 1036 02/07/18 0617 03/07/18 1610   ITP Comments  Med Review completed. Initial ITP created. Diagnosis can be found in [facility] 10/26/17  30 Day review. Continue with ITP unless directed changes per Medical Director review.   Sessions start 2/5  Kayla Malone was scheduled to start rehab today and did not attend.  Called to check on her.  Her husband  answered the mobile number and said he was at work and would have her call us.    30 day review. Continue with ITP unless directed changes per Medical Director review.  3 visits this month  30 Day review. Continue with ITP unless directed changes per Medical Director review.  Kayla Malone called yesterday to let us know she had fallen and was to see her MD about her injury   [place] Name 03/13/18 1457 03/13/18 1527 03/26/18 1634 04/04/18 0621 05/02/18 0621   ITP Comments  Called to check on status of return.  We had a note that she had fallen at home last week. She has not been back since then.  The note was not dated. She answered first call to home number and lost connection, when I called back she did not answer and I was not able to leave a message.  Kayla Malone called back.  She is hoping to return on Thursday.  She had slipped in the bathroom.  She is still a little sore in her hip, but her no  pain in her chest. She forgot to set her alarm this morning.   Kaedyn had a stress test - said results were good but she was told she has 3 leaky valves.  Will f/u with Dr.  30 day review. Continue with ITP unless directed changes per Medical Director  30 day review. Continue with ITP unless directed changes per Medical Director      Comments:

## 2018-05-02 NOTE — Progress Notes (Signed)
Daily Session Note  Patient Details  Name: THOMASINA HOUSLEY MRN: [id number] Date of Birth: November 07, 1961 Referring Provider:     Cardiac Rehab from 12/28/2017 in [facility] Cardiac and Pulmonary Rehab  Referring Provider  [name] Date: 05/02/2018  Check In: Session Check In - 05/02/18 1756      Check-In   Location  [facility]-Cardiac & Pulmonary Rehab    Staff Present  Gerlene Burdock, RN, BSN;Meredith Sherryll Burger, RN Vickki Hearing, BA, ACSM CEP, Exercise Physiologist    Supervising physician immediately available to respond to emergencies  See telemetry face sheet for immediately available ER MD    Medication changes reported      No    Fall or balance concerns reported     No    Warm-up and Cool-down  Performed on first and last piece of equipment    Resistance Training Performed  Yes    VAD Patient?  No      Pain Assessment   Currently in Pain?  No/denies    Multiple Pain Sites  No          Social History   Tobacco Use  Smoking Status Former Smoker  . Last attempt to quit: 09/13/1998  . Years since quitting: 19.6  Smokeless Tobacco Never Used    Goals Met:  Independence with exercise equipment Exercise tolerated well No report of cardiac concerns or symptoms Strength training completed today  Goals Unmet:  Not Applicable  Comments:  6 Minute Walk    Row Name 12/28/17 1500 05/02/18 1757       6 Minute Walk   Phase  -  Discharge    Distance  610 feet  888 feet    Distance % Change  -  44 %    Distance Feet Change  -  270 ft    Walk Time  6 minutes  5.75 minutes    # of Rest Breaks  1  1    MPH  1.15  -    METS  1.52  2.9    RPE  11  15    Perceived Dyspnea   1  1    VO2 Peak  5.3  10.21    Symptoms  Yes (comment)  Yes (comment)    Comments  felt like L leg "giving out"  - had stroke  L leg weak due to stroke    Resting HR  71 bpm  78 bpm    Resting BP  118/82  134/74    Resting Oxygen Saturation   99 %  -    Exercise Oxygen Saturation  during 6  min walk  100 %  -    Max Ex. HR  101 bpm  95 bpm    Max Ex. BP  150/78  136/64    2 Minute Post BP  148/78  -         Dr. Emily Filbert is Medical Director for [place] and [name].

## 2018-05-09 ENCOUNTER — Encounter: Payer: Medicare Other | Admitting: *Deleted

## 2018-05-09 DIAGNOSIS — Z951 Presence of aortocoronary bypass graft: Secondary | ICD-10-CM

## 2018-05-09 NOTE — Progress Notes (Signed)
Discharge Progress Report  Patient Details  Name: Kayla Malone MRN: [id number] Date of Birth: 10-Aug-1961 Referring Provider:     Cardiac Rehab from 12/28/2017 in [facility] Cardiac and Pulmonary Rehab  Referring Provider  Humphrey Rolls       Number of Visits: 36  Reason for Discharge:  Patient reached a stable level of exercise. Patient independent in their exercise. Patient has met program and personal goals.  Smoking History:  Social History   Tobacco Use  Smoking Status Former Smoker  . Last attempt to quit: 09/13/1998  . Years since quitting: 19.6  Smokeless Tobacco Never Used    Diagnosis:  S/P CABG x 2  ADL UCSD:   Initial Exercise Prescription: Initial Exercise Prescription - 12/28/17 1500      Date of Initial Exercise RX and Referring Provider   Date  12/28/17    Referring Provider  Humphrey Rolls      Recumbant Bike   Level  1    RPM  60    Minutes  15    METs  1.5      NuStep   Level  1    SPM  80    Minutes  15    METs  1.5      Biostep-RELP   Level  1    SPM  50    Minutes  15    METs  2      Track   Laps  15    Minutes  15    METs  1.5      Prescription Details   Frequency (times per week)  3    Duration  Progress to 45 minutes of aerobic exercise without signs/symptoms of physical distress      Intensity   THRR 40-80% of Max Heartrate  107-145    Ratings of Perceived Exertion  11-13    Perceived Dyspnea  0-4      Resistance Training   Training Prescription  Yes    Weight  2 lb    Reps  10-15       Discharge Exercise Prescription (Final Exercise Prescription Changes): Exercise Prescription Changes - 04/26/18 1500      Response to Exercise   Blood Pressure (Admit)  138/80    Blood Pressure (Exercise)  138/70    Blood Pressure (Exit)  120/68    Heart Rate (Admit)  65 bpm    Heart Rate (Exercise)  114 bpm    Heart Rate (Exit)  67 bpm    Rating of Perceived Exertion (Exercise)  14    Duration  Continue with 45 min of aerobic exercise  without signs/symptoms of physical distress.    Intensity  Other (comment)      Progression   Progression  Continue to progress workloads to maintain intensity without signs/symptoms of physical distress.    Average METs  3.3      Resistance Training   Training Prescription  Yes    Weight  2 lb    Reps  10-15      Interval Training   Interval Training  No      NuStep   Level  2    Minutes  15    METs  3.6      Biostep-RELP   Level  2    Minutes  15    METs  3      Home Exercise Plan   Plans to continue exercise at  Home (comment) walking  Frequency  Add 1 additional day to program exercise sessions.    Initial Home Exercises Provided  02/20/18       Functional Capacity: 6 Minute Walk    Row Name 12/28/17 1500 05/02/18 1757       6 Minute Walk   Phase  -  Discharge    Distance  610 feet  888 feet    Distance % Change  -  44 %    Distance Feet Change  -  270 ft    Walk Time  6 minutes  5.75 minutes    # of Rest Breaks  1  1    MPH  1.15  -    METS  1.52  2.9    RPE  11  15    Perceived Dyspnea   1  1    VO2 Peak  5.3  10.21    Symptoms  Yes (comment)  Yes (comment)    Comments  felt like L leg "giving out"  - had stroke  L leg weak due to stroke    Resting HR  71 bpm  78 bpm    Resting BP  118/82  134/74    Resting Oxygen Saturation   99 %  -    Exercise Oxygen Saturation  during 6 min walk  100 %  -    Max Ex. HR  101 bpm  95 bpm    Max Ex. BP  150/78  136/64    2 Minute Post BP  148/78  -       Psychological, QOL, Others - Outcomes: PHQ 2/9: Depression screen PHQ 2/9 05/09/2018 12/28/2017  Decreased Interest 2 2  Down, Depressed, Hopeless 2 2  PHQ - 2 Score 4 4  Altered sleeping 2 2  Tired, decreased energy 2 2  Change in appetite 2 3  Feeling bad or failure about yourself  3 0  Trouble concentrating 2 1  Moving slowly or fidgety/restless 1 0  Suicidal thoughts 0 0  PHQ-9 Score 16 12  Difficult doing work/chores Somewhat difficult Not  difficult at all    Quality of Life: Quality of Life - 05/09/18 1645      Quality of Life Scores   Health/Function Post  19.53 %    Socioeconomic Post  20.3 %    Psych/Spiritual Post  21.43 %    Family Post  24.6 %    GLOBAL Post  20.86 %       Personal Goals: Goals established at orientation with interventions provided to work toward goal. Personal Goals and Risk Factors at Admission - 12/28/17 1351      Core Components/Risk Factors/Patient Goals on Admission    Weight Management  Yes;Weight Maintenance    Intervention  Weight Management: Develop a combined nutrition and exercise program designed to reach desired caloric intake, while maintaining appropriate intake of nutrient and fiber, sodium and fats, and appropriate energy expenditure required for the weight goal.;Weight Management: Provide education and appropriate resources to help participant work on and attain dietary goals.    Admit Weight  135 lb (61.2 kg) Micah reports having a no desire to eat and has been losing weight without trying    Expected Outcomes  Short Term: Continue to assess and modify interventions until short term weight is achieved;Long Term: Adherence to nutrition and physical activity/exercise program aimed toward attainment of established weight goal;Weight Maintenance: Understanding of the daily nutrition guidelines, which includes 25-35% calories from fat, 7% or   less cal from saturated fats, less than 233m cholesterol, less than 1.5gm of sodium, & 5 or more servings of fruits and vegetables daily;Understanding recommendations for meals to include 15-35% energy as protein, 25-35% energy from fat, 35-60% energy from carbohydrates, less than 2044mof dietary cholesterol, 20-35 gm of total fiber daily;Understanding of distribution of calorie intake throughout the day with the consumption of 4-5 meals/snacks    Diabetes  Yes    Intervention  Provide education about signs/symptoms and action to take for  hypo/hyperglycemia.;Provide education about proper nutrition, including hydration, and aerobic/resistive exercise prescription along with prescribed medications to achieve blood glucose in normal ranges: Fasting glucose 65-99 mg/dL    Expected Outcomes  Long Term: Attainment of HbA1C < 7%.;Short Term: Participant verbalizes understanding of the signs/symptoms and immediate care of hyper/hypoglycemia, proper foot care and importance of medication, aerobic/resistive exercise and nutrition plan for blood glucose control.    Hypertension  Yes    Intervention  Provide education on lifestyle modifcations including regular physical activity/exercise, weight management, moderate sodium restriction and increased consumption of fresh fruit, vegetables, and low fat dairy, alcohol moderation, and smoking cessation.;Monitor prescription use compliance.    Expected Outcomes  Short Term: Continued assessment and intervention until BP is < 140/9043mG in hypertensive participants. < 130/69m75m in hypertensive participants with diabetes, heart failure or chronic kidney disease.;Long Term: Maintenance of blood pressure at goal levels.    Lipids  Yes    Intervention  Provide education and support for participant on nutrition & aerobic/resistive exercise along with prescribed medications to achieve LDL <70mg31mL >40mg.42mExpected Outcomes  Short Term: Participant states understanding of desired cholesterol values and is compliant with medications prescribed. Participant is following exercise prescription and nutrition guidelines.;Long Term: Cholesterol controlled with medications as prescribed, with individualized exercise RX and with personalized nutrition plan. Value goals: LDL < 70mg, 59m> 40 mg.        Personal Goals Discharge: Goals and Risk Factor Review    Row Name 02/20/18 0907 03/26/18 1628 04/27/18 1142         Core Components/Risk Factors/Patient Goals Review   Personal Goals Review  Weight  Management/Obesity;Diabetes;Hypertension;Lipids  Weight Management/Obesity;Diabetes;Hypertension;Lipids  Weight Management/Obesity;Lipids;Hypertension;Stress;Diabetes     Review  Shanah haSabrieen doing well in rehab.  Her weight was up three pounds today but she was not sure why, possibly more clothes trying to to stay warm.  Blood sugars have been pretty good but some days are higher than others.  She has started to notice a pattern of how she eats and her sugars.   Her blood pressures have good in class.  She does not check it at home unless she doesn't feel good.  No problems with her medications other than her skin reaction.  She has a yeast infection under breast and arms.  She is using a powder and was just starteing a new cream for it.  Pt has been taking all meds as directed.  She has been eating more so BG has been up.  She hasn't met with RD yet. (just switched class times)  Rianna haHenrien taking all meds as directed.  Her FBG has been between 98-101.  BP has been good at home and at HT.  [facility] has more energy now than before beginning an exercie rogram.  She does walk and do strength exercises at home.  Staff gave info about [place] exercise whe she completes HT.  She gets cholesterol checked tomorrow.     Expected Outcomes  Short: Continue to work on weight loss.  Long: Continue to monitor her blood sugars more closely.   Short - schedule time with RD to meet Long - use healthy diet to help control BG and heart health  Short - Pt will complete Ht Long - Pt will maintain exercise on her own        Exercise Goals and Review: Exercise Goals    Row Name 12/28/17 1459             Exercise Goals   Increase Physical Activity  Yes       Intervention  Provide advice, education, support and counseling about physical activity/exercise needs.;Develop an individualized exercise prescription for aerobic and resistive training based on initial evaluation findings, risk  stratification, comorbidities and participant's personal goals.       Expected Outcomes  Short Term: Attend rehab on a regular basis to increase amount of physical activity.;Long Term: Add in home exercise to make exercise part of routine and to increase amount of physical activity.;Long Term: Exercising regularly at least 3-5 days a week.       Increase Strength and Stamina  Yes       Intervention  Provide advice, education, support and counseling about physical activity/exercise needs.;Develop an individualized exercise prescription for aerobic and resistive training based on initial evaluation findings, risk stratification, comorbidities and participant's personal goals.       Expected Outcomes  Short Term: Increase workloads from initial exercise prescription for resistance, speed, and METs.;Short Term: Perform resistance training exercises routinely during rehab and add in resistance training at home;Long Term: Improve cardiorespiratory fitness, muscular endurance and strength as measured by increased METs and functional capacity (6MWT)       Able to understand and use rate of perceived exertion (RPE) scale  Yes       Intervention  Provide education and explanation on how to use RPE scale       Expected Outcomes  Short Term: Able to use RPE daily in rehab to express subjective intensity level;Long Term:  Able to use RPE to guide intensity level when exercising independently       Able to understand and use Dyspnea scale  Yes       Intervention  Provide education and explanation on how to use Dyspnea scale       Expected Outcomes  Short Term: Able to use Dyspnea scale daily in rehab to express subjective sense of shortness of breath during exertion;Long Term: Able to use Dyspnea scale to guide intensity level when exercising independently       Knowledge and understanding of Target Heart Rate Range (THRR)  Yes       Intervention  Provide education and explanation of THRR including how the numbers  were predicted and where they are located for reference       Expected Outcomes  Short Term: Able to state/look up THRR;Short Term: Able to use daily as guideline for intensity in rehab;Long Term: Able to use THRR to govern intensity when exercising independently       Able to check pulse independently  Yes       Intervention  Provide education and demonstration on how to check pulse in carotid and radial arteries.;Review the importance of being able to check your own pulse for safety during independent exercise       Expected Outcomes  Short Term: Able to explain why pulse   checking is important during independent exercise;Long Term: Able to check pulse independently and accurately       Understanding of Exercise Prescription  Yes       Intervention  Provide education, explanation, and written materials on patient's individual exercise prescription       Expected Outcomes  Short Term: Able to explain program exercise prescription;Long Term: Able to explain home exercise prescription to exercise independently          Nutrition & Weight - Outcomes: Pre Biometrics - 12/28/17 1458      Pre Biometrics   Height  5' 4.5" (1.638 m)    Weight  149 lb 1.6 oz (67.6 kg)    Waist Circumference  34.5 inches    Hip Circumference  39.5 inches    Waist to Hip Ratio  0.87 %    BMI (Calculated)  25.21      Post Biometrics - 05/02/18 1757       Post  Biometrics   Height  5' 4.5" (1.638 m)    Weight  153 lb (69.4 kg)    Waist Circumference  34 inches    Hip Circumference  40 inches    Waist to Hip Ratio  0.85 %    BMI (Calculated)  25.87       Nutrition: Nutrition Therapy & Goals - 04/27/18 1142      Nutrition Therapy   RD appointment deferred  Yes       Nutrition Discharge: Nutrition Assessments - 05/09/18 1646      MEDFICTS Scores   Post Score  6       Education Questionnaire Score: Knowledge Questionnaire Score - 05/09/18 1646      Knowledge Questionnaire Score   Post Score   22/28       Goals reviewed with patient; copy given to patient. 

## 2018-05-09 NOTE — Progress Notes (Signed)
Daily Session Note  Patient Details  Name: Kayla Malone MRN: [id number] Date of Birth: Mar 30, 1961 Referring Provider:     Cardiac Rehab from 12/28/2017 in [facility] Re[facility]  Referring Provider  [name] Date: 05/09/2018  Check In: Session Check In - 05/09/18 1627      Check-In   Location  [facility]-Cardiac & Pulmonary Rehab    Staff Present  Constance Goltz, RN BSN;Jessica Luan Pulling, MA, RCEP, CCRP, Exercise Physiologist;Kamee Bobst Sherryll Burger, RN BSN    Supervising physician immediately available to respond to emergencies  See telemetry face sheet for immediately available ER MD    Medication changes reported      No    Fall or balance concerns reported     No    Tobacco Cessation  No Change    Warm-up and Cool-down  Performed on first and last piece of equipment    Resistance Training Performed  Yes    VAD Patient?  No      Pain Assessment   Currently in Pain?  No/denies          Social History   Tobacco Use  Smoking Status Former Smoker  . Last attempt to quit: 09/13/1998  . Years since quitting: 19.6  Smokeless Tobacco Never Used    Goals Met:  Independence with exercise equipment Exercise tolerated well No report of cardiac concerns or symptoms Strength training completed today  Goals Unmet:  Not Applicable  Comments:  Liya graduated today from  rehab with 36 sessions completed.  Details of the patient's exercise prescription and what She needs to do in order to continue the prescription and progress were discussed with patient.  Patient was given a copy of prescription and goals.  Patient verbalized understanding.  Kinjal plans to continue to exercise by attending [employer].    Dr. Emily Filbert is Medical Director for [name].

## 2018-05-09 NOTE — Progress Notes (Signed)
Cardiac Individual Treatment Plan  Patient Details  Name: Kayla Malone MRN: [id number] Date of Birth: 1961/11/06 Referring Provider:     Cardiac Rehab from 12/28/2017 in [facility] Cardiac [facility]  Referring Provider  Humphrey Rolls      Initial Encounter Date:    Cardiac Rehab from 12/28/2017 in [facility] Cardiac and Pulmonary Rehab  Date  12/28/17  Referring Provider  Humphrey Rolls      Visit Diagnosis: S/P CABG x 2  Patient's Home Medications on Admission:  Current Outpatient Medications:  .  acetaminophen (TYLENOL) 500 MG tablet, Take 1,000 mg by mouth every 6 (six) hours as needed for mild pain or headache., Disp: , Rfl:  .  albuterol (PROVENTIL HFA;VENTOLIN HFA) 108 (90 Base) MCG/ACT inhaler, Inhale 2 puffs into the lungs every 6 (six) hours as needed for wheezing or shortness of breath. , Disp: , Rfl:  .  aspirin EC 81 MG tablet, Take 81 mg by mouth daily., Disp: , Rfl:  .  atorvastatin (LIPITOR) 40 MG tablet, Take 40 mg by mouth daily., Disp: , Rfl:  .  buPROPion (ZYBAN) 150 MG 12 hr tablet, Take 150 mg by mouth 2 (two) times daily., Disp: , Rfl: 5 .  fluticasone (FLONASE) 50 MCG/ACT nasal spray, Place 2 sprays into both nostrils daily., Disp: , Rfl:  .  Fluticasone-Salmeterol (ADVAIR DISKUS) 500-50 MCG/DOSE AEPB, Inhale 1 puff into the lungs daily as needed (SOB). , Disp: , Rfl:  .  hydrochlorothiazide (MICROZIDE) 12.5 MG capsule, Take 12.5 mg by mouth., Disp: , Rfl:  .  metFORMIN (GLUCOPHAGE) 500 MG tablet, Take 500 mg by mouth 3 (three) times daily. , Disp: , Rfl: 5 .  metoprolol tartrate (LOPRESSOR) 25 MG tablet, Take 1 tablet (25 mg total) 2 (two) times daily by mouth., Disp: 60 tablet, Rfl: 1 .  mirtazapine (REMERON) 15 MG tablet, TAKE 1 TABLET BY MOUTH EVERYDAY AT BEDTIME, Disp: , Rfl: 2 .  oxyCODONE (OXY IR/ROXICODONE) 5 MG immediate release tablet, Take 1 tablet (5 mg total) every 4 (four) hours as needed by mouth for severe pain. (Patient not taking: Reported on 12/28/2017),  Disp: 30 tablet, Rfl: 0 .  pantoprazole (PROTONIX) 40 MG tablet, Take 40 mg by mouth. , Disp: , Rfl:  .  sitaGLIPtin (JANUVIA) 50 MG tablet, Take 50 mg by mouth daily., Disp: , Rfl:  .  traZODone (DESYREL) 50 MG tablet, Take 50 mg by mouth at bedtime., Disp: , Rfl: 5 .  vitamin B-12 (CYANOCOBALAMIN) 1000 MCG tablet, Take by mouth., Disp: , Rfl:  .  Vitamin D, Ergocalciferol, (DRISDOL) 50000 units CAPS capsule, Take 50,000 Units every 7 (seven) days by mouth. tuesday, Disp: , Rfl:  .  VOLTAREN 1 % GEL, APPLY 2 GRAMS TOPICALLY 4 TIMES DAILY AS NEEDED, Disp: , Rfl: 2  Past Medical History: Past Medical History:  Diagnosis Date  . Asthma   . Coronary artery disease   . Depression   . Diabetes mellitus without complication ([place])   . GERD (gastroesophageal reflux disease)   . History of hiatal hernia   . Hypertension   . Pain    BACK  . Stroke ([place])    x3  . Thyroid cyst     Tobacco Use: Social History   Tobacco Use  Smoking Status Former Smoker  . Last attempt to quit: 09/13/1998  . Years since quitting: 19.6  Smokeless Tobacco Never Used    Labs: Recent Review [facility] for [facility] Cardiac and  Pulmonary Rehab Latest Ref Rng & Units 10/23/2017 10/23/2017 10/23/2017 10/23/2017 10/24/2017   Hemoglobin A1c 4.8 - 5.6 % 7.3(H) - - - -   PHART 7.350 - 7.450 - 7.359 7.402 - -   PCO2ART 32.0 - 48.0 mmHg - 44.0 41.4 - -   HCO3 20.0 - 28.0 mmol/L - 25.1 26.0 - -   TCO2 22 - 32 mmol/L - _0 ACIDBASEDEF 0.0 - 2.0 mmol/L - 1.0 - - -   O2SAT % - 99.0 99.0 - -       Exercise Target Goals:    Exercise Program Goal: Individual exercise prescription set using results from initial 6 min walk test and THRR while considering  patient's activity barriers and safety.   Exercise Prescription Goal: Initial exercise prescription builds to 30-45 minutes a day of aerobic activity, 2-3 days per week.  Home exercise guidelines will be given to patient during program as part of  exercise prescription that the participant will acknowledge.  Activity Barriers & Risk Stratification: Activity Barriers & Cardiac Risk Stratification - 12/28/17 1430      Activity Barriers & Cardiac Risk Stratification   Activity Barriers  Back Problems;Muscular Weakness;Chest Pain/Angina hx of stroke so left sided weakness    Cardiac Risk Stratification  Moderate       6 Minute Walk: 6 Minute Walk    Row Name 12/28/17 1500 05/02/18 1757       6 Minute Walk   Phase  -  Discharge    Distance  610 feet  888 feet    Distance % Change  -  44 %    Distance Feet Change  -  270 ft    Walk Time  6 minutes  5.75 minutes    # of Rest Breaks  1  1    MPH  1.15  -    METS  1.52  2.9    RPE  11  15    Perceived Dyspnea   1  1    VO2 Peak  5.3  10.21    Symptoms  Yes (comment)  Yes (comment)    Comments  felt like L leg "giving out"  - had stroke  L leg weak due to stroke    Resting HR  71 bpm  78 bpm    Resting BP  118/82  134/74    Resting Oxygen Saturation   99 %  -    Exercise Oxygen Saturation  during 6 min walk  100 %  -    Max Ex. HR  101 bpm  95 bpm    Max Ex. BP  150/78  136/64    2 Minute Post BP  148/78  -       Oxygen Initial Assessment:   Oxygen Re-Evaluation:   Oxygen Discharge (Final Oxygen Re-Evaluation):   Initial Exercise Prescription: Initial Exercise Prescription - 12/28/17 1500      Date of Initial Exercise RX and Referring Provider   Date  12/28/17    Referring Provider  Humphrey Rolls      Recumbant Bike   Level  1    RPM  60    Minutes  15    METs  1.5      NuStep   Level  1    SPM  80    Minutes  15    METs  1.5      Biostep-RELP   Level  1    SPM  50    Minutes  15    METs  2      Track   Laps  15    Minutes  15    METs  1.5      Prescription Details   Frequency (times per week)  3    Duration  Progress to 45 minutes of aerobic exercise without signs/symptoms of physical distress      Intensity   THRR 40-80% of Max Heartrate   107-145    Ratings of Perceived Exertion  11-13    Perceived Dyspnea  0-4      Resistance Training   Training Prescription  Yes    Weight  2 lb    Reps  10-15       Perform Capillary Blood Glucose checks as needed.  Exercise Prescription Changes:  Exercise Prescription Changes    Row Name 12/28/17 1400 01/30/18 1500 02/13/18 1400 02/20/18 0900 02/27/18 1400     Response to Exercise   Blood Pressure (Admit)  118/82  114/72  128/70  -  122/62   Blood Pressure (Exercise)  150/78  134/72  128/64  -  112/66   Blood Pressure (Exit)  130/78  138/80  122/72  -  114/66   Heart Rate (Admit)  80 bpm  70 bpm  77 bpm  -  81 bpm   Heart Rate (Exercise)  87 bpm  115 bpm  119 bpm  -  102 bpm   Heart Rate (Exit)  68 bpm  66 bpm  72 bpm  -  68 bpm   Oxygen Saturation (Admit)  99 %  -  -  -  -   Oxygen Saturation (Exit)  100 %  -  -  -  -   Rating of Perceived Exertion (Exercise)  _0 -  13   Symptoms  -  fatigue  fatigue  -  fatigue   Comments  -  second full day of exercise  -  -  -   Duration  -  Progress to 45 minutes of aerobic exercise without signs/symptoms of physical distress  Continue with 30 min of aerobic exercise without signs/symptoms of physical distress.  -  Continue with 30 min of aerobic exercise without signs/symptoms of physical distress.   Intensity  -  THRR unchanged  THRR unchanged  -  THRR unchanged     Progression   Progression  -  Continue to progress workloads to maintain intensity without signs/symptoms of physical distress.  Continue to progress workloads to maintain intensity without signs/symptoms of physical distress.  -  Continue to progress workloads to maintain intensity without signs/symptoms of physical distress.   Average METs  -  1.61  1.65  -  2.26     Resistance Training   Training Prescription  -  Yes  Yes  -  Yes   Weight  -  2 lb  2 lbs  -  2 lbs   Reps  -  10-15  10-15  -  10-15     Interval Training   Interval Training  -  No  No  -  No      NuStep   Level  -  1  1  -  1   Minutes  -  15  15  -  15   METs  -  1.3  1.3  -  2.4     Biostep-RELP   Level  -  -  -  -  2   Minutes  -  -  -  -  15   METs  -  -  -  -  2     Track   Laps  -  20  21  -  30   Minutes  -  15  15  -  15   METs  -  1.92  2  -  2.38     Home Exercise Plan   Plans to continue exercise at  -  -  -  Home (comment) walking  Home (comment) walking   Frequency  -  -  -  Add 1 additional day to program exercise sessions.  Add 1 additional day to program exercise sessions.   Initial Home Exercises Provided  -  -  -  02/20/18  02/20/18   Row Name 03/28/18 1500 04/11/18 1200 04/26/18 1500         Response to Exercise   Blood Pressure (Admit)  142/80  146/78  138/80     Blood Pressure (Exercise)  124/84  152/72  138/70     Blood Pressure (Exit)  142/80  146/66  120/68     Heart Rate (Admit)  84 bpm  82 bpm  65 bpm     Heart Rate (Exercise)  101 bpm  92 bpm  114 bpm     Heart Rate (Exit)  75 bpm  69 bpm  67 bpm     Rating of Perceived Exertion (Exercise)  _0 Duration  Continue with 45 min of aerobic exercise without signs/symptoms of physical distress.  Continue with 45 min of aerobic exercise without signs/symptoms of physical distress.  Continue with 45 min of aerobic exercise without signs/symptoms of physical distress.     Intensity  THRR unchanged  Other (comment) limited in function by previous stroke  Other (comment)       Progression   Progression  Continue to progress workloads to maintain intensity without signs/symptoms of physical distress.  Continue to progress workloads to maintain intensity without signs/symptoms of physical distress.  Continue to progress workloads to maintain intensity without signs/symptoms of physical distress.     Average METs  2.3  2.5  3.3       Resistance Training   Training Prescription  Yes  Yes  Yes     Weight  2 lb  2 lb  2 lb     Reps  10-15  10-15  10-15       Interval Training   Interval  Training  No  No  No       NuStep   Level  _1 Minutes  _2 METs  2.6  2.5  3.6       Biostep-RELP   Level  2  -  2     Minutes  15  -  15     METs  2  -  3       Track   Minutes  -  15  -       Home Exercise Plan   Plans to continue exercise at  Home (comment) walking  Home (comment) walking  Home (comment) walking     Frequency  Add 1 additional day to program exercise sessions.  Add 1 additional day to program exercise sessions.  Add 1 additional day to program  exercise sessions.     Initial Home Exercises Provided  02/20/18  02/20/18  02/20/18        Exercise Comments:  Exercise Comments    Row Name 01/23/18 0941 05/09/18 1629         Exercise Comments   First full day of exercise!  Patient was oriented to gym and equipment including functions, settings, policies, and procedures.  Patient's individual exercise prescription and treatment plan were reviewed.  All starting workloads were established based on the results of the 6 minute walk test done at initial orientation visit.  The plan for exercise progression was also introduced and progression will be customized based on patient's performance and goals.  Kayla Malone graduated today from  rehab with 36 sessions completed.  Details of the patient's exercise prescription and what She needs to do in order to continue the prescription and progress were discussed with patient.  Patient was given a copy of prescription and goals.  Patient verbalized understanding.  Kayla Malone plans to continue to exercise by attending [employer].         Exercise Goals and Review:  Exercise Goals    Row Name 12/28/17 1459             Exercise Goals   Increase Physical Activity  Yes       Intervention  Provide advice, education, support and counseling about physical activity/exercise needs.;Develop an individualized exercise prescription for aerobic and resistive training based on initial evaluation findings, risk stratification,  comorbidities and participant's personal goals.       Expected Outcomes  Short Term: Attend rehab on a regular basis to increase amount of physical activity.;Long Term: Add in home exercise to make exercise part of routine and to increase amount of physical activity.;Long Term: Exercising regularly at least 3-5 days a week.       Increase Strength and Stamina  Yes       Intervention  Provide advice, education, support and counseling about physical activity/exercise needs.;Develop an individualized exercise prescription for aerobic and resistive training based on initial evaluation findings, risk stratification, comorbidities and participant's personal goals.       Expected Outcomes  Short Term: Increase workloads from initial exercise prescription for resistance, speed, and METs.;Short Term: Perform resistance training exercises routinely during rehab and add in resistance training at home;Long Term: Improve cardiorespiratory fitness, muscular endurance and strength as measured by increased METs and functional capacity (6MWT)       Able to understand and use rate of perceived exertion (RPE) scale  Yes       Intervention  Provide education and explanation on how to use RPE scale       Expected Outcomes  Short Term: Able to use RPE daily in rehab to express subjective intensity level;Long Term:  Able to use RPE to guide intensity level when exercising independently       Able to understand and use Dyspnea scale  Yes       Intervention  Provide education and explanation on how to use Dyspnea scale       Expected Outcomes  Short Term: Able to use Dyspnea scale daily in rehab to express subjective sense of shortness of breath during exertion;Long Term: Able to use Dyspnea scale to guide intensity level when exercising independently       Knowledge and understanding of Target Heart Rate Range (THRR)  Yes       Intervention  Provide education and explanation of THRR including how the  numbers were predicted and  where they are located for reference       Expected Outcomes  Short Term: Able to state/look up THRR;Short Term: Able to use daily as guideline for intensity in rehab;Long Term: Able to use THRR to govern intensity when exercising independently       Able to check pulse independently  Yes       Intervention  Provide education and demonstration on how to check pulse in carotid and radial arteries.;Review the importance of being able to check your own pulse for safety during independent exercise       Expected Outcomes  Short Term: Able to explain why pulse checking is important during independent exercise;Long Term: Able to check pulse independently and accurately       Understanding of Exercise Prescription  Yes       Intervention  Provide education, explanation, and written materials on patient's individual exercise prescription       Expected Outcomes  Short Term: Able to explain program exercise prescription;Long Term: Able to explain home exercise prescription to exercise independently          Exercise Goals Re-Evaluation : Exercise Goals Re-Evaluation    Row Name 01/23/18 0941 01/30/18 1513 02/13/18 1449 02/20/18 0920 03/13/18 1458     Exercise Goal Re-Evaluation   Exercise Goals Review  Increase Physical Activity;Increase Strength and Stamina;Knowledge and understanding of Target Heart Rate Range (THRR);Able to understand and use rate of perceived exertion (RPE) scale  Increase Physical Activity;Understanding of Exercise Prescription;Increase Strength and Stamina  Understanding of Exercise Prescription;Increase Physical Activity;Increase Strength and Stamina  Understanding of Exercise Prescription;Increase Physical Activity;Increase Strength and Stamina;Knowledge and understanding of Target Heart Rate Range (THRR);Able to check pulse independently;Able to understand and use rate of perceived exertion (RPE) scale  -   Comments  Reviewed RPE scale, THR and program prescription with pt today.   Pt voiced understanding and was given a copy of goals to take home.   Kayla Malone has completed two full days of exercise.  She is still building up her stamina.  She missed today as she overslept and will aim for Thursday.  We will continue to monitor her progression.   Kayla Malone is doing well in rehab.  She does come late, but she is still coming.  She is now up to 21 laps on the track.  We will continue to monitor her progression.   Kayla Malone has been doing well in rehab. She is able to do more in class and getting her full 20 min on the machines. Reviewed home exercise with pt today.  Pt plans to walk at home and stores for exercise.  Reviewed THR, pulse, RPE, sign and symptoms, and when to call 911 or MD.  Also discussed weather considerations and indoor options.  Pt voiced understanding.  Out since last review.   Expected Outcomes  Short: Use RPE daily to regulate intensity.  Long: Follow program prescription in THR.  Short: Attend rehab regularly.  Long: Continue to follow program prescription.   Short: Review home exercise and encourage attendance to class and to [facility].  Long: Continue to work on [employer].   Short: Start to add in 1 extra day of walking at home.   Long: Continue to build strength and stamina.   -   Row Name 03/28/18 1517 04/11/18 1249 04/26/18 1527         Exercise Goal Re-Evaluation   Exercise Goals Review  Increase Physical Activity;Able to  understand and use rate of perceived exertion (RPE) scale;Increase Strength and Stamina  Increase Physical Activity;Able to understand and use rate of perceived exertion (RPE) scale;Increase Strength and Stamina  Increase Physical Activity;Able to understand and use rate of perceived exertion (RPE) scale;Knowledge and understanding of Target Heart Rate Range (THRR);Increase Strength and Stamina     Comments  Pt has been more consistent attending afternoon class.  Staff will monitor progress - she is limited by residual stroke s/s   Kayla Malone has attended more consistently.  She has increased to level 2 on NS.  Staff fell she is making progress with stamina.  Pt has increased overall MET level.  Staff will monitor progess     Expected Outcomes  Short - Pt will attend consistently Long - Pt will improve overall MET level  Short - Kayla Malone will exercise 3 days per week with classes and home exercise  Long - Kayla Malone will continue to exercise on her own   Short - Pt will complete HT  Long - Pt will exercise on her own        Discharge Exercise Prescription (Final Exercise Prescription Changes): Exercise Prescription Changes - 04/26/18 1500      Response to Exercise   Blood Pressure (Admit)  138/80    Blood Pressure (Exercise)  138/70    Blood Pressure (Exit)  120/68    Heart Rate (Admit)  65 bpm    Heart Rate (Exercise)  114 bpm    Heart Rate (Exit)  67 bpm    Rating of Perceived Exertion (Exercise)  14    Duration  Continue with 45 min of aerobic exercise without signs/symptoms of physical distress.    Intensity  Other (comment)      Progression   Progression  Continue to progress workloads to maintain intensity without signs/symptoms of physical distress.    Average METs  3.3      Resistance Training   Training Prescription  Yes    Weight  2 lb    Reps  10-15      Interval Training   Interval Training  No      NuStep   Level  2    Minutes  15    METs  3.6      Biostep-RELP   Level  2    Minutes  15    METs  3      Home Exercise Plan   Plans to continue exercise at  Home (comment) walking    Frequency  Add 1 additional day to program exercise sessions.    Initial Home Exercises Provided  02/20/18       Nutrition:  Target Goals: Understanding of nutrition guidelines, daily intake of sodium '1500mg'$ , cholesterol '200mg'$ , calories 30% from fat and 7% or less from saturated fats, daily to have 5 or more servings of fruits and vegetables.  Biometrics: Pre Biometrics - 12/28/17 1458      Pre Biometrics   Height   5' 4.5" (1.638 m)    Weight  149 lb 1.6 oz (67.6 kg)    Waist Circumference  34.5 inches    Hip Circumference  39.5 inches    Waist to Hip Ratio  0.87 %    BMI (Calculated)  25.21      Post Biometrics - 05/02/18 1757       Post  Biometrics   Height  5' 4.5" (1.638 m)    Weight  153 lb (69.4 kg)    Waist Circumference  34 inches    Hip Circumference  40 inches    Waist to Hip Ratio  0.85 %    BMI (Calculated)  25.87       Nutrition Therapy Plan and Nutrition Goals: Nutrition Therapy & Goals - 04/27/18 1142      Nutrition Therapy   RD appointment deferred  Yes       Nutrition Assessments: Nutrition Assessments - 05/09/18 1646      MEDFICTS Scores   Post Score  6       Nutrition Goals Re-Evaluation: Nutrition Goals Re-Evaluation    Row Name 02/20/18 0914             Goals   Nutrition Goal  Meet with dietician to review diet tweaks for weight loss       Comment  Scheduled appointment with Lattie Haw to talk with [facility] during class.  She is starting to get her appetite back and would like some help with weightloss.        Expected Outcome  Short: meet with dietician.  Long: Continue to follow recommendations.           Nutrition Goals Discharge (Final Nutrition Goals Re-Evaluation): Nutrition Goals Re-Evaluation - 02/20/18 0914      Goals   Nutrition Goal  Meet with dietician to review diet tweaks for weight loss    Comment  Scheduled appointment with Lattie Haw to talk with [facility] during class.  She is starting to get her appetite back and would like some help with weightloss.     Expected Outcome  Short: meet with dietician.  Long: Continue to follow recommendations.        Psychosocial: Target Goals: Acknowledge presence or absence of significant depression and/or stress, maximize coping skills, provide positive support system. Participant is able to verbalize types and ability to use techniques and skills needed for reducing stress and depression.   Initial Review &  Psychosocial Screening: Initial Psych Review & Screening - 12/28/17 1425      Initial Review   Current issues with  History of Depression;Current Stress Concerns    Source of Stress Concerns  Poor Coping Skills;Unable to participate in former interests or hobbies    Comments  Dakota has little drive to do much around the house or to get healthier.       Family Dynamics   Good Support System?  No one daughter did accompany her to Med Review and also cooks some for her, but Andrey states she doesn't feel she has a good support system    Strains  Intra-family strains      Screening Interventions   Interventions  Encouraged to exercise;Program counselor consult;To provide support and resources with identified psychosocial needs    Expected Outcomes  Short Term goal: Utilizing psychosocial counselor, staff and physician to assist with identification of specific Stressors or current issues interfering with healing process. Setting desired goal for each stressor or current issue identified.;Long Term Goal: Stressors or current issues are controlled or eliminated.;Short Term goal: Identification and review with participant of any Quality of Life or Depression concerns found by scoring the questionnaire.;Long Term goal: The participant improves quality of Life and PHQ9 Scores as seen by post scores and/or verbalization of changes       Quality of Life Scores:  Quality of Life - 05/09/18 1645      Quality of Life Scores   Health/Function Post  19.53 %    Socioeconomic Post  20.3 %  Psych/Spiritual Post  21.43 %    Family Post  24.6 %    GLOBAL Post  20.86 %      Scores of 19 and below usually indicate a poorer quality of life in these areas.  A difference of  2-3 points is a clinically meaningful difference.  A difference of 2-3 points in the total score of the Quality of Life Index has been associated with significant improvement in overall quality of life, self-image, physical symptoms, and  general health in studies assessing change in quality of life.  PHQ-9: Recent Review Flowsheet Data    Depression screen [facility] 2/9 05/09/2018 12/28/2017   Decreased Interest 2 2   Down, Depressed, Hopeless 2 2   PHQ - 2 Score 4 4   Altered sleeping 2 2   Tired, decreased energy 2 2   Change in appetite 2 3   Feeling bad or failure about yourself  3 0   Trouble concentrating 2 1   Moving slowly or fidgety/restless 1 0   Suicidal thoughts 0 0   PHQ-9 Score 16 12   Difficult doing work/chores Somewhat difficult Not difficult at all     Interpretation of Total Score  Total Score Depression Severity:  1-4 = Minimal depression, 5-9 = Mild depression, 10-14 = Moderate depression, 15-19 = Moderately severe depression, 20-27 = Severe depression   Psychosocial Evaluation and Intervention: Psychosocial Evaluation - 05/09/18 1714      Discharge Psychosocial Assessment & Intervention   Comments  Kayla Malone is graduating from this program today.  Counselor met with her to discuss her progress and any concerns.  Kayla Malone reports she has benefitted a great deal from this program.  She entered the program very scared and afraid of another heart attack and that fear has subsided.  Now Kayla Malone looks forward to coming to exercise and feels stronger; more energy and more confident about the benefits of consistent exercise.  Kayla Malone continues to battle with a lot of depression and her PHQ-9 scores increased to a "17" which is moderately severe symptoms.  Counselor explored this with Kayla Malone stating her financial situation impacts her quality of life as she has "just enough" to get by at this time.  Her relationships are positive with family members and she has strong supports with them.  She continues to be fearful of engaging with others but is able to do so in a structured program of this nature.  Counselor encouarged Kayla Malone to continue exercising consistently.  She sees her Dr. 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  Jodie's QOL scores improved a little and she reports her sleep is better when she takes the medication prescribed for it.  She agreed to continue taking that daily as directed.  Counselor commended [place] on all the progress made while in this program and wishes her well.         Psychosocial Re-Evaluation: Psychosocial Re-Evaluation    Row Name 02/20/18 0916 03/26/18 1631 04/27/18 1146         Psychosocial Re-Evaluation   Current issues with  Current Depression;Current Stress Concerns  Current Stress Concerns  Current Stress Concerns;Current Sleep Concerns     Comments  Kayla Malone to her doctor about her depression.  They uped her medication from 83m to 50 mg to help her eat and depression.   She can't really tell much difference.  She still doesn't feel like doing a  whole lot or getting out.  She did attend [facility] last month and is going to try to go today. She is so fearful that something else is going to happen again.  Her fear is limiting her ability to move forward from this event.  We will continue to work with her during rehab.   Pt states she is feeling more comfortable with exercise - still doesn't want to do any at home alone.  Does not want to talk about other stress issues  Pt states she still doesnt sleep well.  She has trouble falling asleep due to her stroke.  She does use trazadone and is trying no soda after 3 pm to see if that helps.  Staff suggested relaxation breathing to help as well.  She reports no other new stress.       Expected Outcomes  Short: Go to [facility]  Long: Continue to cope with her depression.  Short - Pt wil continue to attend class Long - Pt will manage stress wiht exercise and techniques learned in class  Short - Cailee will try relaxation breathing techniques and eliminating late day soda to help her go to sleep  Kayla Malone will develop better sleep  patterns     Interventions  Encouraged to attend  Cardiac Rehabilitation for the exercise;Stress management education  Encouraged to attend Cardiac Rehabilitation for the exercise  -     Continue Psychosocial Services   Follow up required by counselor  Follow up required by staff  -     Comments  Lack of ambition, does not want to get out, fearful of something else happening.   -  -       Initial Review   Source of Stress Concerns  Poor Coping Skills;Unable to participate in former interests or hobbies;Chronic Illness  -  -        Psychosocial Discharge (Final Psychosocial Re-Evaluation): Psychosocial Re-Evaluation - 04/27/18 1146      Psychosocial Re-Evaluation   Current issues with  Current Stress Concerns;Current Sleep Concerns    Comments  Pt states she still doesnt sleep well.  She has trouble falling asleep due to her stroke.  She does use trazadone and is trying no soda after 3 pm to see if that helps.  Staff suggested relaxation breathing to help as well.  She reports no other new stress.      Expected Outcomes  Short - Kayla Malone will try relaxation breathing techniques and eliminating late day soda to help her go to sleep  Long - Kayla Malone will develop better sleep  patterns       Vocational Rehabilitation: Provide vocational rehab assistance to qualifying candidates.   Vocational Rehab Evaluation & Intervention: Vocational Rehab - 12/28/17 1429      Initial Vocational Rehab Evaluation & Intervention   Assessment shows need for Vocational Rehabilitation  No       Education: Education Goals: Education classes will be provided on a variety of topics geared toward better understanding of heart health and risk factor modification. Participant will state understanding/return demonstration of topics presented as noted by education test scores.  Learning Barriers/Preferences: Learning Barriers/Preferences - 12/28/17 1429      Learning Barriers/Preferences   Learning Barriers  None    Learning Preferences  Audio;Video        Education Topics:  AED/CPR: - Group verbal and written instruction with the use of models to demonstrate the basic use of the AED with the basic ABC's of resuscitation.  Cardiac Rehab from 05/09/2018 in [facility] Cardiac and Pulmonary Rehab  Date  05/02/18  Educator  CE  Instruction Review Code  1- Verbalizes Understanding      General Nutrition Guidelines/Fats and Fiber: -Group instruction provided by verbal, written material, models and posters to present the general guidelines for heart healthy nutrition. Gives an explanation and review of dietary fats and fiber.   Controlling Sodium/Reading Food Labels: -Group verbal and written material supporting the discussion of sodium use in heart healthy nutrition. Review and explanation with models, verbal and written materials for utilization of the food label.   Exercise Physiology & General Exercise Guidelines: - Group verbal and written instruction with models to review the exercise physiology of the cardiovascular system and associated critical values. Provides general exercise guidelines with specific guidelines to those with heart or lung disease.    Cardiac Rehab from 05/09/2018 in [facility] Cardiac and Pulmonary Rehab  Date  03/21/18  Educator  [facility]  Instruction Review Code  1- Verbalizes Understanding      Aerobic Exercise & Resistance Training: - Gives group verbal and written instruction on the various components of exercise. Focuses on aerobic and resistive training programs and the benefits of this training and how to safely progress through these programs..   Cardiac Rehab from 05/09/2018 in [facility] Cardiac and Pulmonary Rehab  Date  03/26/18  Educator  [facility]  Instruction Review Code  1- Geologist, engineering, Balance, Mind/Body Relaxation: Provides group verbal/written instruction on the benefits of flexibility and balance training, including mind/body exercise modes such as yoga, pilates and tai chi.   Demonstration and skill practice provided.   Stress and Anxiety: - Provides group verbal and written instruction about the health risks of elevated stress and causes of high stress.  Discuss the correlation between heart/lung disease and anxiety and treatment options. Review healthy ways to manage with stress and anxiety.   Cardiac Rehab from 05/09/2018 in [facility] Cardiac and Pulmonary Rehab  Date  02/13/18  Educator  [facility]  Instruction Review Code  1- Verbalizes Understanding      Depression: - Provides group verbal and written instruction on the correlation between heart/lung disease and depressed mood, treatment options, and the stigmas associated with seeking treatment.   Cardiac Rehab from 05/09/2018 in [facility] Cardiac and Pulmonary Rehab  Date  03/28/18  Educator  [facility]  Instruction Review Code  1- Verbalizes Understanding      Anatomy & Physiology of the Heart: - Group verbal and written instruction and models provide basic cardiac anatomy and physiology, with the coronary electrical and arterial systems. Review of Valvular disease and Heart Failure   Cardiac Rehab from 05/09/2018 in [facility] Cardiac and Pulmonary Rehab  Date  04/09/18  Educator  CE  Instruction Review Code  1- Verbalizes Understanding      Cardiac Procedures: - Group verbal and written instruction to review commonly prescribed medications for heart disease. Reviews the medication, class of the drug, and side effects. Includes the steps to properly store meds and maintain the prescription regimen. (beta blockers and nitrates)   Cardiac Medications I: - Group verbal and written instruction to review commonly prescribed medications for heart disease. Reviews the medication, class of the drug, and side effects. Includes the steps to properly store meds and maintain the prescription regimen.   Cardiac Rehab from 05/09/2018 in [facility] Cardiac and Pulmonary Rehab  Date  04/16/18  Educator  CE  Instruction Review Code  1- Verbalizes  Understanding  Cardiac Medications II: -Group verbal and written instruction to review commonly prescribed medications for heart disease. Reviews the medication, class of the drug, and side effects. (all other drug classes)   Cardiac Rehab from 05/09/2018 in [facility] Cardiac and Pulmonary Rehab  Date  02/15/18  Educator  [facility]  Instruction Review Code  1- Verbalizes Understanding       Go Sex-Intimacy & Heart Disease, Get SMART - Goal Setting: - Group verbal and written instruction through game format to discuss heart disease and the return to sexual intimacy. Provides group verbal and written material to discuss and apply goal setting through the application of the S.M.A.R.T. Method.   Other Matters of the Heart: - Provides group verbal, written materials and models to describe Stable Angina and Peripheral Artery. Includes description of the disease process and treatment options available to the cardiac patient.   Cardiac Rehab from 05/09/2018 in [facility] Cardiac and Pulmonary Rehab  Date  04/09/18  Educator  CE  Instruction Review Code  1- Verbalizes Understanding      Exercise & Equipment Safety: - Individual verbal instruction and demonstration of equipment use and safety with use of the equipment.   Cardiac Rehab from 05/09/2018 in [facility] Cardiac and Pulmonary Rehab  Date  12/28/17  Educator  [facility]  Instruction Review Code  1- Verbalizes Understanding      Infection Prevention: - Provides verbal and written material to individual with discussion of infection control including proper hand washing and proper equipment cleaning during exercise session.   Cardiac Rehab from 05/09/2018 in [facility] Cardiac and Pulmonary Rehab  Date  12/28/17  Educator  [facility]  Instruction Review Code  1- Verbalizes Understanding      Falls Prevention: - Provides verbal and written material to individual with discussion of falls prevention and safety.   Cardiac Rehab from 05/09/2018 in [name] Cardiac and Pulmonary  Rehab  Date  12/28/17  Educator  [facility]  Instruction Review Code  1- Verbalizes Understanding      Diabetes: - Individual verbal and written instruction to review signs/symptoms of diabetes, desired ranges of glucose level fasting, after meals and with exercise. Acknowledge that pre and post exercise glucose checks will be done for 3 sessions at entry of program.   Cardiac Rehab from 05/09/2018 in [facility] Cardiac and Pulmonary Rehab  Date  12/28/17  Educator  [facility]  Instruction Review Code  1- Verbalizes Understanding      Know Your Numbers and Risk Factors: -Group verbal and written instruction about important numbers in your health.  Discussion of what are risk factors and how they play a role in the disease process.  Review of Cholesterol, Blood Pressure, Diabetes, and BMI and the role they play in your overall health.   Cardiac Rehab from 05/09/2018 in [facility] Cardiac and Pulmonary Rehab  Date  02/15/18  Educator  [facility]  Instruction Review Code  1- Verbalizes Understanding      Sleep Hygiene: -Provides group verbal and written instruction about how sleep can affect your health.  Define sleep hygiene, discuss sleep cycles and impact of sleep habits. Review good sleep hygiene tips.    Cardiac Rehab from 05/09/2018 in [facility] Cardiac and Pulmonary Rehab  Date  04/25/18  Educator  [facility]  Instruction Review Code  1- Verbalizes Understanding      Other: -Provides group and verbal instruction on various topics (see comments)   Knowledge Questionnaire Score: Knowledge Questionnaire Score - 05/09/18 1646      Knowledge Questionnaire Score  Post Score  22/28       Core Components/Risk Factors/Patient Goals at Admission: Personal Goals and Risk Factors at Admission - 12/28/17 1351      Core Components/Risk Factors/Patient Goals on Admission    Weight Management  Yes;Weight Maintenance    Intervention  Weight Management: Develop a combined nutrition and exercise program designed to reach desired  caloric intake, while maintaining appropriate intake of nutrient and fiber, sodium and fats, and appropriate energy expenditure required for the weight goal.;Weight Management: Provide education and appropriate resources to help participant work on and attain dietary goals.    Admit Weight  135 lb (61.2 kg) Kayla Malone reports having a no desire to eat and has been losing weight without trying    Expected Outcomes  Short Term: Continue to assess and modify interventions until short term weight is achieved;Long Term: Adherence to nutrition and physical activity/exercise program aimed toward attainment of established weight goal;Weight Maintenance: Understanding of the daily nutrition guidelines, which includes 25-35% calories from fat, 7% or less cal from saturated fats, less than 268m cholesterol, less than 1.5gm of sodium, & 5 or more servings of fruits and vegetables daily;Understanding recommendations for meals to include 15-35% energy as protein, 25-35% energy from fat, 35-60% energy from carbohydrates, less than 2013mof dietary cholesterol, 20-35 gm of total fiber daily;Understanding of distribution of calorie intake throughout the day with the consumption of 4-5 meals/snacks    Diabetes  Yes    Intervention  Provide education about signs/symptoms and action to take for hypo/hyperglycemia.;Provide education about proper nutrition, including hydration, and aerobic/resistive exercise prescription along with prescribed medications to achieve blood glucose in normal ranges: Fasting glucose 65-99 mg/dL    Expected Outcomes  Long Term: Attainment of HbA1C < 7%.;Short Term: Participant verbalizes understanding of the signs/symptoms and immediate care of hyper/hypoglycemia, proper foot care and importance of medication, aerobic/resistive exercise and nutrition plan for blood glucose control.    Hypertension  Yes    Intervention  Provide education on lifestyle modifcations including regular physical activity/exercise,  weight management, moderate sodium restriction and increased consumption of fresh fruit, vegetables, and low fat dairy, alcohol moderation, and smoking cessation.;Monitor prescription use compliance.    Expected Outcomes  Short Term: Continued assessment and intervention until BP is < 140/9016mG in hypertensive participants. < 130/103m65m in hypertensive participants with diabetes, heart failure or chronic kidney disease.;Long Term: Maintenance of blood pressure at goal levels.    Lipids  Yes    Intervention  Provide education and support for participant on nutrition & aerobic/resistive exercise along with prescribed medications to achieve LDL <70mg55mL >40mg.76mExpected Outcomes  Short Term: Participant states understanding of desired cholesterol values and is compliant with medications prescribed. Participant is following exercise prescription and nutrition guidelines.;Long Term: Cholesterol controlled with medications as prescribed, with individualized exercise RX and with personalized nutrition plan. Value goals: LDL < 70mg, 47m> 40 mg.       Core Components/Risk Factors/Patient Goals Review:  Goals and Risk Factor Review    Row Name 02/20/18 0907 [id number] 04/27/18 1142         Core Components/Risk Factors/Patient Goals Review   Personal Goals Review  Weight Management/Obesity;Diabetes;Hypertension;Lipids  Weight Management/Obesity;Diabetes;Hypertension;Lipids  Weight Management/Obesity;Lipids;Hypertension;Stress;Diabetes     Review  Kayla Malone ha[place]yonaen doing well in rehab.  Her weight was up three pounds today but she was not sure why, possibly more clothes trying to to stay warm.  Blood sugars have been pretty  good but some days are higher than others.  She has started to notice a pattern of how she eats and her sugars.   Her blood pressures have good in class.  She does not check it at home unless she doesn't feel good.  No problems with her medications other than her skin reaction.  She  has a yeast infection under breast and arms.  She is using a powder and was just starteing a new cream for it.  Pt has been taking all meds as directed.  She has been eating more so BG has been up.  She hasn't met with RD yet. (just switched class times)  Kayla Malone has been taking all meds as directed.  Her FBG has been between 98-101.  BP has been good at home and at [facility].  She feels she has more energy now than before beginning an exercie rogram.  She does walk and do strength exercises at home.  Staff gave info about [place] to continue exercise whe she completes HT.  She gets cholesterol checked tomorrow.     Expected Outcomes  Short: Continue to work on weight loss.  Long: Continue to monitor her blood sugars more closely.   Short - schedule time with RD to meet Long - use healthy diet to help control BG and heart health  Short - Pt will complete Ht Long - Pt will maintain exercise on her own        Core Components/Risk Factors/Patient Goals at Discharge (Final Review):  Goals and Risk Factor Review - 04/27/18 1142      Core Components/Risk Factors/Patient Goals Review   Personal Goals Review  Weight Management/Obesity;Lipids;Hypertension;Stress;Diabetes    Review  Kayla Malone has been taking all meds as directed.  Her FBG has been between 98-101.  BP has been good at home and at [facility].  She feels she has more energy now than before beginning an exercie rogram.  She does walk and do strength exercises at home.  Staff gave info about [place] to continue exercise whe she completes HT.  She gets cholesterol checked tomorrow.    Expected Outcomes  Short - Pt will complete Ht Long - Pt will maintain exercise on her own       ITP Comments: ITP Comments    Row Name 12/28/17 1224 01/10/18 0606 01/16/18 1036 02/07/18 0617 03/07/18 2620   ITP Comments  Med Review completed. Initial ITP created. Diagnosis can be found in [facility] 10/26/17  30 Day review. Continue with ITP unless directed changes per Medical Director  review.   Sessions start 2/5  Kayla Malone was scheduled to start rehab today and did not attend.  Called to check on her.  Her husband answered the mobile number and said he was at work and would have her call us.    30 day review. Continue with ITP unless directed changes per Medical Director review.  3 visits this month  30 Day review. Continue with ITP unless directed changes per Medical Director review.  Kayla Malone called yesterday to let us know she had fallen and was to see her MD about her injury   [place] Name 03/13/18 1457 03/13/18 1527 03/26/18 1634 04/04/18 0621 05/02/18 0621   ITP Comments  Called to check on status of return.  We had a note that she had fallen at home last week. She has not been back since then.  The note was not dated. She answered first call to home number and lost connection, when  I called back she did not answer and I was not able to leave a message.  Loren called back.  She is hoping to return on Thursday.  She had slipped in the bathroom.  She is still a little sore in her hip, but her no pain in her chest. She forgot to set her alarm this morning.   Glorya had a stress test - said results were good but she was told she has 3 leaky valves.  Will f/u with Dr.  30 day review. Continue with ITP unless directed changes per Medical Director  30 day review. Continue with ITP unless directed changes per Medical Director      Comments: Discharge ITP

## 2018-05-09 NOTE — Patient Instructions (Signed)
Discharge Patient Instructions  Patient Details  Name: Kayla Malone MRN: [id number] Date of Birth: [birth year] Referring Provider:  Dionisio [name], MD   Number of Visits: 68  Reason for Discharge:  Patient reached a stable level of exercise. Patient independent in their exercise. Patient has met program and personal goals.  Smoking History:  Social History   Tobacco Use  Smoking Status Former Smoker  . Last attempt to quit: 09/13/1998  . Years since quitting: 19.6  Smokeless Tobacco Never Used    Diagnosis:  S/P CABG x 2  Initial Exercise Prescription: Initial Exercise Prescription - 12/28/17 1500      Date of Initial Exercise RX and Referring Provider   Date  12/28/17    Referring Provider  Humphrey Rolls      Recumbant Bike   Level  1    RPM  60    Minutes  15    METs  1.5      NuStep   Level  1    SPM  80    Minutes  15    METs  1.5      Biostep-RELP   Level  1    SPM  50    Minutes  15    METs  2      Track   Laps  15    Minutes  15    METs  1.5      Prescription Details   Frequency (times per week)  3    Duration  Progress to 45 minutes of aerobic exercise without signs/symptoms of physical distress      Intensity   THRR 40-80% of Max Heartrate  107-145    Ratings of Perceived Exertion  11-13    Perceived Dyspnea  0-4      Resistance Training   Training Prescription  Yes    Weight  2 lb    Reps  10-15       Discharge Exercise Prescription (Final Exercise Prescription Changes): Exercise Prescription Changes - 04/26/18 1500      Response to Exercise   Blood Pressure (Admit)  138/80    Blood Pressure (Exercise)  138/70    Blood Pressure (Exit)  120/68    Heart Rate (Admit)  65 bpm    Heart Rate (Exercise)  114 bpm    Heart Rate (Exit)  67 bpm    Rating of Perceived Exertion (Exercise)  14    Duration  Continue with 45 min of aerobic exercise without signs/symptoms of physical distress.    Intensity  Other (comment)      Progression    Progression  Continue to progress workloads to maintain intensity without signs/symptoms of physical distress.    Average METs  3.3      Resistance Training   Training Prescription  Yes    Weight  2 lb    Reps  10-15      Interval Training   Interval Training  No      NuStep   Level  2    Minutes  15    METs  3.6      Biostep-RELP   Level  2    Minutes  15    METs  3      Home Exercise Plan   Plans to continue exercise at  Home (comment) walking    Frequency  Add 1 additional day to program exercise sessions.    Initial Home Exercises Provided  02/20/18  Functional Capacity: 6 Minute Walk    Row Name 12/28/17 1500 05/02/18 1757       6 Minute Walk   Phase  -  Discharge    Distance  610 feet  888 feet    Distance % Change  -  44 %    Distance Feet Change  -  270 ft    Walk Time  6 minutes  5.75 minutes    # of Rest Breaks  1  1    MPH  1.15  -    METS  1.52  2.9    RPE  11  15    Perceived Dyspnea   1  1    VO2 Peak  5.3  10.21    Symptoms  Yes (comment)  Yes (comment)    Comments  felt like L leg "giving out"  - had stroke  L leg weak due to stroke    Resting HR  71 bpm  78 bpm    Resting BP  118/82  134/74    Resting Oxygen Saturation   99 %  -    Exercise Oxygen Saturation  during 6 min walk  100 %  -    Max Ex. HR  101 bpm  95 bpm    Max Ex. BP  150/78  136/64    2 Minute Post BP  148/78  -       Quality of Life: Quality of Life - 12/28/17 1428      Quality of Life Scores   Health/Function Pre  20.2 %    Socioeconomic Pre  19.43 %    Psych/Spiritual Pre  20.86 %    Family Pre  20.6 %    GLOBAL Pre  20.24 %       Personal Goals: Goals established at orientation with interventions provided to work toward goal. Personal Goals and Risk Factors at Admission - 12/28/17 1351      Core Components/Risk Factors/Patient Goals on Admission    Weight Management  Yes;Weight Maintenance    Intervention  Weight Management: Develop a combined  nutrition and exercise program designed to reach desired caloric intake, while maintaining appropriate intake of nutrient and fiber, sodium and fats, and appropriate energy expenditure required for the weight goal.;Weight Management: Provide education and appropriate resources to help participant work on and attain dietary goals.    Admit Weight  135 lb (61.2 kg) Sumedha reports having a no desire to eat and has been losing weight without trying    Expected Outcomes  Short Term: Continue to assess and modify interventions until short term weight is achieved;Long Term: Adherence to nutrition and physical activity/exercise program aimed toward attainment of established weight goal;Weight Maintenance: Understanding of the daily nutrition guidelines, which includes 25-35% calories from fat, 7% or less cal from saturated fats, less than '200mg'$  cholesterol, less than 1.5gm of sodium, & 5 or more servings of fruits and vegetables daily;Understanding recommendations for meals to include 15-35% energy as protein, 25-35% energy from fat, 35-60% energy from carbohydrates, less than '200mg'$  of dietary cholesterol, 20-35 gm of total fiber daily;Understanding of distribution of calorie intake throughout the day with the consumption of 4-5 meals/snacks    Diabetes  Yes    Intervention  Provide education about signs/symptoms and action to take for hypo/hyperglycemia.;Provide education about proper nutrition, including hydration, and aerobic/resistive exercise prescription along with prescribed medications to achieve blood glucose in normal ranges: Fasting glucose 65-99 mg/dL    Expected Outcomes  Long Term: Attainment of HbA1C < 7%.;Short Term: Participant verbalizes understanding of the signs/symptoms and immediate care of hyper/hypoglycemia, proper foot care and importance of medication, aerobic/resistive exercise and nutrition plan for blood glucose control.    Hypertension  Yes    Intervention  Provide education on lifestyle  modifcations including regular physical activity/exercise, weight management, moderate sodium restriction and increased consumption of fresh fruit, vegetables, and low fat dairy, alcohol moderation, and smoking cessation.;Monitor prescription use compliance.    Expected Outcomes  Short Term: Continued assessment and intervention until BP is < 140/32m HG in hypertensive participants. < 130/853mHG in hypertensive participants with diabetes, heart failure or chronic kidney disease.;Long Term: Maintenance of blood pressure at goal levels.    Lipids  Yes    Intervention  Provide education and support for participant on nutrition & aerobic/resistive exercise along with prescribed medications to achieve LDL <7079mHDL >69m4m  Expected Outcomes  Short Term: Participant states understanding of desired cholesterol values and is compliant with medications prescribed. Participant is following exercise prescription and nutrition guidelines.;Long Term: Cholesterol controlled with medications as prescribed, with individualized exercise RX and with personalized nutrition plan. Value goals: LDL < 70mg32mL > 40 mg.        Personal Goals Discharge: Goals and Risk Factor Review - 04/27/18 1142      Core Components/Risk Factors/Patient Goals Review   Personal Goals Review  Weight Management/Obesity;Lipids;Hypertension;Stress;Diabetes    Review  [place] Mazelbeen taking all meds as directed.  Her FBG has been between 98-101.  BP has been good at home and at HT.  [facility] has more energy now than before beginning an exercie rogram.  She does walk and do strength exercises at home.  Staff gave info about [employer] exercise whe she completes HT.  She gets cholesterol checked tomorrow.    Expected Outcomes  Short - Pt will complete Ht Long - Pt will maintain exercise on her own       Exercise Goals and Review: Exercise Goals    Row Name 12/28/17 1459             Exercise Goals   Increase  Physical Activity  Yes       Intervention  Provide advice, education, support and counseling about physical activity/exercise needs.;Develop an individualized exercise prescription for aerobic and resistive training based on initial evaluation findings, risk stratification, comorbidities and participant's personal goals.       Expected Outcomes  Short Term: Attend rehab on a regular basis to increase amount of physical activity.;Long Term: Add in home exercise to make exercise part of routine and to increase amount of physical activity.;Long Term: Exercising regularly at least 3-5 days a week.       Increase Strength and Stamina  Yes       Intervention  Provide advice, education, support and counseling about physical activity/exercise needs.;Develop an individualized exercise prescription for aerobic and resistive training based on initial evaluation findings, risk stratification, comorbidities and participant's personal goals.       Expected Outcomes  Short Term: Increase workloads from initial exercise prescription for resistance, speed, and METs.;Short Term: Perform resistance training exercises routinely during rehab and add in resistance training at home;Long Term: Improve cardiorespiratory fitness, muscular endurance and strength as measured by increased METs and functional capacity (6MWT)       Able to understand and use rate of perceived exertion (RPE) scale  Yes  Intervention  Provide education and explanation on how to use RPE scale       Expected Outcomes  Short Term: Able to use RPE daily in rehab to express subjective intensity level;Long Term:  Able to use RPE to guide intensity level when exercising independently       Able to understand and use Dyspnea scale  Yes       Intervention  Provide education and explanation on how to use Dyspnea scale       Expected Outcomes  Short Term: Able to use Dyspnea scale daily in rehab to express subjective sense of shortness of breath during  exertion;Long Term: Able to use Dyspnea scale to guide intensity level when exercising independently       Knowledge and understanding of Target Heart Rate Range (THRR)  Yes       Intervention  Provide education and explanation of THRR including how the numbers were predicted and where they are located for reference       Expected Outcomes  Short Term: Able to state/look up THRR;Short Term: Able to use daily as guideline for intensity in rehab;Long Term: Able to use THRR to govern intensity when exercising independently       Able to check pulse independently  Yes       Intervention  Provide education and demonstration on how to check pulse in carotid and radial arteries.;Review the importance of being able to check your own pulse for safety during independent exercise       Expected Outcomes  Short Term: Able to explain why pulse checking is important during independent exercise;Long Term: Able to check pulse independently and accurately       Understanding of Exercise Prescription  Yes       Intervention  Provide education, explanation, and written materials on patient's individual exercise prescription       Expected Outcomes  Short Term: Able to explain program exercise prescription;Long Term: Able to explain home exercise prescription to exercise independently          Nutrition & Weight - Outcomes: Pre Biometrics - 12/28/17 1458      Pre Biometrics   Height  5' 4.5" (1.638 m)    Weight  149 lb 1.6 oz (67.6 kg)    Waist Circumference  34.5 inches    Hip Circumference  39.5 inches    Waist to Hip Ratio  0.87 %    BMI (Calculated)  25.21      Post Biometrics - 05/02/18 1757       Post  Biometrics   Height  5' 4.5" (1.638 m)    Weight  153 lb (69.4 kg)    Waist Circumference  34 inches    Hip Circumference  40 inches    Waist to Hip Ratio  0.85 %    BMI (Calculated)  25.87       Nutrition: Nutrition Therapy & Goals - 04/27/18 1142      Nutrition Therapy   RD appointment  deferred  Yes       Nutrition Discharge: Nutrition Assessments - 12/28/17 1410      MEDFICTS Scores   Pre Score  -- Will fill out when appetite returns       Education Questionnaire Score: Knowledge Questionnaire Score - 12/28/17 1429      Knowledge Questionnaire Score   Pre Score  19/28 correct answers reviewed with [facility]       Goals reviewed with patient; copy given to patient.

## 2018-05-14 ENCOUNTER — Encounter: Payer: Medicare Other | Attending: Cardiovascular Disease

## 2018-05-14 DIAGNOSIS — Z87891 Personal history of nicotine dependence: Secondary | ICD-10-CM | POA: Insufficient documentation

## 2018-05-14 DIAGNOSIS — Z951 Presence of aortocoronary bypass graft: Secondary | ICD-10-CM | POA: Insufficient documentation

## 2018-05-20 ENCOUNTER — Other Ambulatory Visit: Payer: Self-pay

## 2018-05-20 ENCOUNTER — Emergency Department: Payer: Medicare Other

## 2018-05-20 ENCOUNTER — Encounter: Payer: Self-pay | Admitting: Emergency Medicine

## 2018-05-20 DIAGNOSIS — Z87891 Personal history of nicotine dependence: Secondary | ICD-10-CM | POA: Insufficient documentation

## 2018-05-20 DIAGNOSIS — Z7984 Long term (current) use of oral hypoglycemic drugs: Secondary | ICD-10-CM | POA: Diagnosis not present

## 2018-05-20 DIAGNOSIS — Z7982 Long term (current) use of aspirin: Secondary | ICD-10-CM | POA: Insufficient documentation

## 2018-05-20 DIAGNOSIS — Z951 Presence of aortocoronary bypass graft: Secondary | ICD-10-CM | POA: Diagnosis not present

## 2018-05-20 DIAGNOSIS — R51 Headache: Secondary | ICD-10-CM | POA: Diagnosis not present

## 2018-05-20 DIAGNOSIS — E119 Type 2 diabetes mellitus without complications: Secondary | ICD-10-CM | POA: Diagnosis not present

## 2018-05-20 DIAGNOSIS — J45909 Unspecified asthma, uncomplicated: Secondary | ICD-10-CM | POA: Diagnosis not present

## 2018-05-20 DIAGNOSIS — Z79899 Other long term (current) drug therapy: Secondary | ICD-10-CM | POA: Diagnosis not present

## 2018-05-20 DIAGNOSIS — I10 Essential (primary) hypertension: Secondary | ICD-10-CM | POA: Diagnosis not present

## 2018-05-20 DIAGNOSIS — I2510 Atherosclerotic heart disease of native coronary artery without angina pectoris: Secondary | ICD-10-CM | POA: Insufficient documentation

## 2018-05-20 LAB — BASIC METABOLIC PANEL
ANION GAP: 9 (ref 5–15)
BUN: 8 mg/dL (ref 6–20)
CALCIUM: 9.1 mg/dL (ref 8.9–10.3)
CO2: 27 mmol/L (ref 22–32)
Chloride: 102 mmol/L (ref 101–111)
Creatinine, Ser: 0.92 mg/dL (ref 0.44–1.00)
GFR calc Af Amer: >60 mL/min (ref >60)
GFR calc non Af Amer: >60 mL/min (ref >60)
GLUCOSE: 134 mg/dL — AB (ref 65–99)
Potassium: 3.3 mmol/L — ABNORMAL LOW (ref 3.5–5.1)
Sodium: 138 mmol/L (ref 135–145)

## 2018-05-20 LAB — CBC
HCT: 35.5 % (ref 35.0–47.0)
HEMOGLOBIN: 11.8 g/dL — AB (ref 12.0–16.0)
MCH: 26.3 pg (ref 26.0–34.0)
MCHC: 33.1 g/dL (ref 32.0–36.0)
MCV: 79.4 fL — ABNORMAL LOW (ref 80.0–100.0)
Platelets: 340 10*3/uL (ref 150–440)
RBC: 4.47 MIL/uL (ref 3.80–5.20)
RDW: 15.2 % — ABNORMAL HIGH (ref 11.5–14.5)
WBC: 6.2 10*3/uL (ref 3.6–11.0)

## 2018-05-20 LAB — TROPONIN I: Troponin I: <0.03 ng/mL (ref <0.03)

## 2018-05-20 NOTE — ED Triage Notes (Signed)
Patient states that she has pain in her head that radiates into her right jaw into right chest that started Friday. Patient denies shortness of breath, nausea or vomiting.

## 2018-05-21 ENCOUNTER — Emergency Department
Admission: EM | Admit: 2018-05-21 | Discharge: 2018-05-21 | Disposition: A | Discharge status: Home / Self Care | Payer: Medicare Other | Attending: Emergency Medicine | Admitting: Emergency Medicine

## 2018-05-21 ENCOUNTER — Encounter: Payer: Self-pay | Admitting: Radiology

## 2018-05-21 ENCOUNTER — Emergency Department: Payer: Medicare Other

## 2018-05-21 DIAGNOSIS — R51 Headache: Secondary | ICD-10-CM | POA: Diagnosis not present

## 2018-05-21 DIAGNOSIS — R519 Headache, unspecified: Secondary | ICD-10-CM

## 2018-05-21 LAB — TROPONIN I
Troponin I: 0.03 ng/mL (ref <0.03)
Troponin I: <0.03 ng/mL (ref <0.03)

## 2018-05-21 MED ORDER — DIPHENHYDRAMINE HCL 50 MG/ML IJ SOLN
25.0000 mg | Freq: Once | INTRAMUSCULAR | Status: AC
  Administered 2018-05-21: 25 mg via INTRAVENOUS
  Filled 2018-05-21: qty 1

## 2018-05-21 MED ORDER — SODIUM CHLORIDE 0.9 % IV BOLUS
1000.0000 mL | Freq: Once | INTRAVENOUS | Status: AC
Start: 2018-05-21 — End: 2018-05-21
  Administered 2018-05-21: 1000 mL via INTRAVENOUS

## 2018-05-21 MED ORDER — PROCHLORPERAZINE EDISYLATE 10 MG/2ML IJ SOLN
2.5000 mg | Freq: Once | INTRAMUSCULAR | Status: AC
  Administered 2018-05-21: 2.5 mg via INTRAVENOUS
  Filled 2018-05-21: qty 2

## 2018-05-21 MED ORDER — IOPAMIDOL (ISOVUE-370) INJECTION 76%
75.0000 mL | Freq: Once | INTRAVENOUS | Status: AC | PRN
  Administered 2018-05-21: 75 mL via INTRAVENOUS

## 2018-05-21 MED ORDER — PROCHLORPERAZINE MALEATE 10 MG PO TABS: 10.0000 mg | ORAL_TABLET | Freq: Four times a day (QID) | ORAL | 0 refills | Status: DC | PRN

## 2018-05-21 NOTE — ED Notes (Signed)
Pt returned from CT °

## 2018-05-21 NOTE — Discharge Instructions (Addendum)
1.  You may take Compazine as needed for headache and/or nausea. 2.  Return to the ER for worsening symptoms, persistent vomiting, lethargy or other concerns.

## 2018-05-21 NOTE — ED Provider Notes (Signed)
[facility] Emergency Department Provider Note   ____________________________________________   First MD Initiated Contact with Patient 05/21/18 [phone]     (approximate)  I have reviewed the triage vital signs and the nursing notes.   HISTORY  Chief Complaint Chest Pain; Jaw Pain; and Headache    HPI Kayla Malone is a 57 y.o. female who presents to the ED from home with a chief complaint of headache.  Patient has a history of CAD status post CABG, diabetes, no history of migrating disorder who has had a headache for the past 3 days.  Describes mild, gradual onset right-sided headache which radiates into her right neck and chest.  Pain relieved with Tylenol yesterday.  However, had gradual onset worsening pain today relieved with Tylenol.  Denies associated fever, chills, vision changes, shortness of breath, abdominal pain, nausea, vomiting, focal weakness.  States her right fingertips felt numb yesterday.  Denies slurred speech, facial droop or falling.  Denies recent travel or trauma.  Denies use of anticoagulants.   Past Medical History:  Diagnosis Date  . Asthma   . Coronary artery disease   . Depression   . Diabetes mellitus without complication ([place])   . GERD (gastroesophageal reflux disease)   . History of hiatal hernia   . Hypertension   . Pain    BACK  . Stroke ([place])    x3  . Thyroid cyst     Patient Active Problem List   Diagnosis Date Noted  . Hx of CABG 10/30/2017  . Coronary artery disease 10/23/2017  . Unstable angina ([place]) 10/20/2017  . Cerebral artery occlusion with cerebral infarction ([place]) 08/06/2017  . Chest pain, unspecified 08/06/2017  . Diabetes ([place]) 08/06/2017  . Hyperlipidemia, unspecified 08/06/2017  . HTN (hypertension) 08/06/2017  . Lumbar radiculopathy 02/15/2017  . Adenomatous colon polyp 02/20/2014  . Benign neoplasm of colon, unspecified 02/20/2014  . Asthma 09/13/2013  . GERD (gastroesophageal reflux  disease) 09/13/2013  . CVA, old, hemiparesis ([place]) 09/13/2013    Past Surgical History:  Procedure Laterality Date  . ABDOMINAL HYSTERECTOMY    . CARDIAC SURGERY    . CATARACT EXTRACTION W/PHACO Left 08/15/2017   Procedure: CATARACT EXTRACTION PHACO AND INTRAOCULAR LENS PLACEMENT (IOC);  Surgeon: Birder Robson, MD;  Location: [facility] ORS;  Service: Ophthalmology;  Laterality: Left;  [place] 00:40.6 AP% 7.3 CDE 2.98 FLUID PACK LOT # [id number]  . CORONARY ARTERY BYPASS GRAFT N/A 10/23/2017   Procedure: CORONARY ARTERY BYPASS GRAFTING (CABG) x two  , using left internal mammary artery and right leg greater saphenous vein harvested  endoscopically - LIMA to LAD, SVG to RCA;  Surgeon: Grace Isaac, MD;  Location: [place];  Service: Open Heart Surgery;  Laterality: N/A;  . EYE SURGERY    . LEFT HEART CATH AND CORONARY ANGIOGRAPHY Left 10/09/2017   Procedure: LEFT HEART CATH AND CORONARY ANGIOGRAPHY;  Surgeon: Dionisio [name], MD;  Location: [place] [facility];  Service: Cardiovascular;  Laterality: Left;  . TEE WITHOUT CARDIOVERSION N/A 10/23/2017   Procedure: TRANSESOPHAGEAL ECHOCARDIOGRAM (TEE);  Surgeon: Grace Isaac, MD;  Location: [place];  Service: Open Heart Surgery;  Laterality: N/A;    Prior to Admission medications   Medication Sig Start Date End Date Taking? Authorizing Provider  aspirin EC 81 MG tablet Take 81 mg by mouth daily.   Yes [provider]  atorvastatin (LIPITOR) 40 MG tablet Take 40 mg by mouth daily.   Yes [provider]  buPROPion (  ZYBAN) 150 MG 12 hr tablet Take 150 mg by mouth 2 (two) times daily. 02/19/17  Yes [provider]  fluticasone (FLONASE) 50 MCG/ACT nasal spray Place 2 sprays into both nostrils daily.   Yes [provider]  hydrochlorothiazide (MICROZIDE) 12.5 MG capsule Take 12.5 mg by mouth. 03/19/12  Yes [provider]  metFORMIN (GLUCOPHAGE) 500 MG tablet Take 500 mg by mouth 3 (three) times daily.   02/17/17  Yes [provider]  metoprolol tartrate (LOPRESSOR) 25 MG tablet Take 1 tablet (25 mg total) 2 (two) times daily by mouth. 10/28/17  Yes Conte, Tessa N, PA-C  mirtazapine (REMERON) 15 MG tablet TAKE 1 TABLET BY MOUTH EVERYDAY AT BEDTIME 12/18/17  Yes [provider]  pantoprazole (PROTONIX) 40 MG tablet Take 40 mg by mouth.    Yes [provider]  sitaGLIPtin (JANUVIA) 50 MG tablet Take 50 mg by mouth daily.   Yes [provider]  traZODone (DESYREL) 50 MG tablet Take 50 mg by mouth at bedtime. 02/19/17  Yes [provider]  vitamin B-12 (CYANOCOBALAMIN) 1000 MCG tablet Take by mouth.   Yes [provider]  Vitamin D, Ergocalciferol, (DRISDOL) 50000 units CAPS capsule Take 50,000 Units every 7 (seven) days by mouth. tuesday   Yes [provider]  acetaminophen (TYLENOL) 500 MG tablet Take 1,000 mg by mouth every 6 (six) hours as needed for mild pain or headache.    [provider]  albuterol (PROVENTIL HFA;VENTOLIN HFA) 108 (90 Base) MCG/ACT inhaler Inhale 2 puffs into the lungs every 6 (six) hours as needed for wheezing or shortness of breath.     [provider]  Fluticasone-Salmeterol (ADVAIR DISKUS) 500-50 MCG/DOSE AEPB Inhale 1 puff into the lungs daily as needed (SOB).     [provider]  oxyCODONE (OXY IR/ROXICODONE) 5 MG immediate release tablet Take 1 tablet (5 mg total) every 4 (four) hours as needed by mouth for severe pain. Patient not taking: Reported on 12/28/2017 10/28/17   Elgie Collard, PA-C  prochlorperazine (COMPAZINE) 10 MG tablet Take 1 tablet (10 mg total) by mouth every 6 (six) hours as needed for nausea (headache). 05/21/18   Paulette Blanch, MD  VOLTAREN 1 % GEL APPLY 2 GRAMS TOPICALLY 4 TIMES DAILY AS NEEDED 11/07/17   [provider]    Allergies Lisinopril; Tramadol; Amoxicillin; and Clavulanic acid  Family History  Problem Relation Age of Onset  . Heart failure  Mother   . Pulmonary embolism Mother   . Cirrhosis Father   . Coronary artery disease Father   . Diabetes type II Father   . Hypertension Father   . Kidney disease Father   . Coronary artery disease Sister   . Stroke Sister   . COPD Brother   . Coronary artery disease Brother   . Breast cancer Neg Hx     Social History Social History   Tobacco Use  . Smoking status: Former Smoker    Last attempt to quit: 09/13/1998    Years since quitting: 19.6  . Smokeless tobacco: Never Used  Substance Use Topics  . Alcohol use: No  . Drug use: No    Review of Systems  Constitutional: No fever/chills Eyes: No visual changes. ENT: No sore throat. Cardiovascular: Denies chest pain. Respiratory: Denies shortness of breath. Gastrointestinal: No abdominal pain.  No nausea, no vomiting.  No diarrhea.  No constipation. Genitourinary: Negative for dysuria. Musculoskeletal: Negative for back pain. Skin: Negative for rash. Neurological:  Positive for headache and transient fingertip numbness yesterday.  Negative for focal weakness.   ____________________________________________   PHYSICAL EXAM:  VITAL SIGNS: ED Triage Vitals [05/20/18 2236]  Enc Vitals Group     BP (!) 146/68     Pulse Rate 64     Resp 18     Temp 98.5 F (36.9 C)     Temp Source Oral     SpO2 100 %     Weight 157 lb (71.2 kg)     Height 5\' 3"  (1.6 m)     Head Circumference      Peak Flow      Pain Score 9     Pain Loc      Pain Edu?      Excl. in [place]?     Constitutional: Alert and oriented. Well appearing and in mild acute distress. Eyes: Conjunctivae are normal. PERRL. EOMI. Head: Atraumatic. Nose: No congestion/rhinnorhea. Mouth/Throat: Mucous membranes are moist.  Oropharynx non-erythematous. Neck: No stridor.  No cervical spine tenderness to palpation.  No carotid bruits.  Supple neck without meningismus. Cardiovascular: Normal rate, regular rhythm. Grossly normal heart sounds.  Good peripheral  circulation. Respiratory: Normal respiratory effort.  No retractions. Lungs CTAB. Gastrointestinal: Soft and nontender. No distention. No abdominal bruits. No CVA tenderness. Musculoskeletal: No lower extremity tenderness nor edema.  No joint effusions. Neurologic: Alert and oriented x3.  CN II-XII grossly intact.  Normal speech and language. No gross focal neurologic deficits are appreciated.  Skin:  Skin is warm, dry and intact. No rash noted. Psychiatric: Mood and affect are normal. Speech and behavior are normal.  ____________________________________________   LABS (all labs ordered are listed, but only abnormal results are displayed)  Labs Reviewed  BASIC METABOLIC PANEL - Abnormal; Notable for the following components:      Result Value   Potassium 3.3 (*)    Glucose, Bld 134 (*)    All other components within normal limits  CBC - Abnormal; Notable for the following components:   Hemoglobin 11.8 (*)    MCV 79.4 (*)    RDW 15.2 (*)    All other components within normal limits  TROPONIN I - Abnormal; Notable for the following components:   Troponin I 0.03 (*)    All other components within normal limits  TROPONIN I  TROPONIN I  POC URINE PREG, ED   ____________________________________________  EKG  ED ECG REPORT I, Jeyli Zwicker J, the attending physician, personally viewed and interpreted this ECG.   Date: 05/21/2018  EKG Time: 2240  Rate: 62  Rhythm: normal EKG, normal sinus rhythm  Axis: Normal  Intervals:none  ST&T Change: Nonspecific  ____________________________________________  RADIOLOGY  ED MD interpretation: No ICH, no acute cardiopulmonary process  Official radiology report(s): Ct Angio Head W/cm &/or Wo Cm  Result Date: 05/21/2018 CLINICAL DATA:  Initial evaluation for acute root headache radiating into the right neck. EXAM: CT ANGIOGRAPHY HEAD AND NECK TECHNIQUE: Multidetector CT imaging of the head and neck was performed using the standard protocol  during bolus administration of intravenous contrast. Multiplanar CT image reconstructions and MIPs were obtained to evaluate the vascular anatomy. Carotid stenosis measurements (when applicable) are obtained utilizing NASCET criteria, using the distal internal carotid diameter as the denominator. CONTRAST:  1mL ISOVUE-370 IOPAMIDOL (ISOVUE-370) INJECTION 76% COMPARISON:  Prior noncontrast CT from 05/20/2018. FINDINGS: CTA NECK FINDINGS Aortic arch: Visualized aortic arch of normal caliber with normal branch pattern. No flow-limiting stenosis about the origin of the great vessels.  Visualized subclavian arteries widely patent. Right carotid system: Right common carotid artery not well evaluated proximally due to beam hardening artifact from adjacent venous contrast. Visualized right common carotid artery patent to the bifurcation without flow-limiting stenosis. Mild soft atheromatous plaque about the right bifurcation/proximal right ICA without hemodynamically significant stenosis. Right ICA widely patent to the skull base without stenosis, dissection or occlusion. Left carotid system: Left common carotid artery patent from its origin to the bifurcation without stenosis. No significant atheromatous narrowing about the left bifurcation. Left ICA patent to the skull base without stenosis, dissection or occlusion. Vertebral arteries: Both of the vertebral arteries arise from the subclavian arteries. Right vertebral artery dominant. Proximal right V2 segment limited evaluation due to F adjacent venous contamination. Visualized vertebral arteries patent within the neck without stenosis, dissection, or occlusion. Skeleton: No acute osseous abnormality. No worrisome lytic or blastic osseous lesions. Median sternotomy wires noted. Other neck: No acute soft tissue abnormality within the neck. No adenopathy. Salivary glands within normal limits. Thyroid enlarged and heterogeneous in appearance with scattered calcifications,  likely related to multinodular goiter. Upper chest: Visualized upper chest demonstrates no acute finding. Partially visualized lungs are clear. Review of the MIP images confirms the above findings CTA HEAD FINDINGS Anterior circulation: Petrous segments widely patent bilaterally. Atheromatous plaque within the cavernous ICAs with associated moderate stenoses bilaterally. Supraclinoid segments patent. ICA termini widely patent. A1 segments patent bilaterally. Right A1 slightly hypoplastic. Hypoplastic anterior communicating artery. Anterior cerebral arteries patent to their distal aspects without stenosis. M1 segments widely patent bilaterally. No proximal M2 occlusion. Distal MCA branches well perfused and symmetric. Posterior circulation: Vertebral arteries widely patent to the vertebrobasilar junction without stenosis. Right vertebral artery dominant. Posterior inferior cerebral arteries patent bilaterally. Basilar widely patent to its distal aspect. Superior cerebral arteries patent bilaterally. Both of the posterior cerebral arteries primarily supplied via the basilar. PCAs supplied primarily via the basilar and are widely patent to their distal aspects. Venous sinuses: Patent. Anatomic variants: None significant. No aneurysm or vascular malformation. Delayed phase: No abnormal enhancement. Review of the MIP images confirms the above findings IMPRESSION: 1. Negative CTA of the head and neck. No evidence for dissection or other acute vascular abnormality. No large vessel occlusion. 2. Atheromatous plaque within the carotid siphons with associated moderate stenoses. No other hemodynamically significant or correctable stenosis identified. Electronically Signed   By: Jeannine Boga M.D.   On: 05/21/2018 03:53   Dg Chest 2 View  Result Date: 05/20/2018 CLINICAL DATA:  Right-sided chest pain. EXAM: CHEST - 2 VIEW COMPARISON:  Radiographs 12/07/2017 FINDINGS: Post median sternotomy and CABG. The  cardiomediastinal contours are normal. The lungs are clear. Pulmonary vasculature is normal. No consolidation, pleural effusion, or pneumothorax. No acute osseous abnormalities are seen. IMPRESSION: Post CABG without congestive failure or acute abnormality. Electronically Signed   By: Jeb Levering M.D.   On: 05/20/2018 23:37   Ct Head Wo Contrast  Result Date: 05/20/2018 CLINICAL DATA:  Head pain radiating to RIGHT jaw and RIGHT chest since Friday, history asthma, diabetes mellitus, hypertension, coronary artery disease, stroke, former smoker EXAM: CT HEAD WITHOUT CONTRAST TECHNIQUE: Contiguous axial images were obtained from the base of the skull through the vertex without intravenous contrast. Sagittal and coronal MPR images reconstructed from axial data set. COMPARISON:  03/25/2017 FINDINGS: Brain: Normal ventricular morphology. No midline shift or mass effect. Small old RIGHT pontine lacunar infarct. Otherwise normal appearance of brain parenchyma. No intracranial hemorrhage, mass lesion or evidence acute infarction.  No extra-axial fluid collections. Vascular: Unremarkable Skull: Intact Sinuses/Orbits: Clear Other: N/A IMPRESSION: Small old RIGHT pontine lacunar infarct. No acute intracranial abnormalities. Electronically Signed   By: Lavonia Dana M.D.   On: 05/20/2018 23:43   Ct Angio Neck W And/or Wo Contrast  Result Date: 05/21/2018 CLINICAL DATA:  Initial evaluation for acute root headache radiating into the right neck. EXAM: CT ANGIOGRAPHY HEAD AND NECK TECHNIQUE: Multidetector CT imaging of the head and neck was performed using the standard protocol during bolus administration of intravenous contrast. Multiplanar CT image reconstructions and MIPs were obtained to evaluate the vascular anatomy. Carotid stenosis measurements (when applicable) are obtained utilizing NASCET criteria, using the distal internal carotid diameter as the denominator. CONTRAST:  11mL ISOVUE-370 IOPAMIDOL (ISOVUE-370)  INJECTION 76% COMPARISON:  Prior noncontrast CT from 05/20/2018. FINDINGS: CTA NECK FINDINGS Aortic arch: Visualized aortic arch of normal caliber with normal branch pattern. No flow-limiting stenosis about the origin of the great vessels. Visualized subclavian arteries widely patent. Right carotid system: Right common carotid artery not well evaluated proximally due to beam hardening artifact from adjacent venous contrast. Visualized right common carotid artery patent to the bifurcation without flow-limiting stenosis. Mild soft atheromatous plaque about the right bifurcation/proximal right ICA without hemodynamically significant stenosis. Right ICA widely patent to the skull base without stenosis, dissection or occlusion. Left carotid system: Left common carotid artery patent from its origin to the bifurcation without stenosis. No significant atheromatous narrowing about the left bifurcation. Left ICA patent to the skull base without stenosis, dissection or occlusion. Vertebral arteries: Both of the vertebral arteries arise from the subclavian arteries. Right vertebral artery dominant. Proximal right V2 segment limited evaluation due to F adjacent venous contamination. Visualized vertebral arteries patent within the neck without stenosis, dissection, or occlusion. Skeleton: No acute osseous abnormality. No worrisome lytic or blastic osseous lesions. Median sternotomy wires noted. Other neck: No acute soft tissue abnormality within the neck. No adenopathy. Salivary glands within normal limits. Thyroid enlarged and heterogeneous in appearance with scattered calcifications, likely related to multinodular goiter. Upper chest: Visualized upper chest demonstrates no acute finding. Partially visualized lungs are clear. Review of the MIP images confirms the above findings CTA HEAD FINDINGS Anterior circulation: Petrous segments widely patent bilaterally. Atheromatous plaque within the cavernous ICAs with associated  moderate stenoses bilaterally. Supraclinoid segments patent. ICA termini widely patent. A1 segments patent bilaterally. Right A1 slightly hypoplastic. Hypoplastic anterior communicating artery. Anterior cerebral arteries patent to their distal aspects without stenosis. M1 segments widely patent bilaterally. No proximal M2 occlusion. Distal MCA branches well perfused and symmetric. Posterior circulation: Vertebral arteries widely patent to the vertebrobasilar junction without stenosis. Right vertebral artery dominant. Posterior inferior cerebral arteries patent bilaterally. Basilar widely patent to its distal aspect. Superior cerebral arteries patent bilaterally. Both of the posterior cerebral arteries primarily supplied via the basilar. PCAs supplied primarily via the basilar and are widely patent to their distal aspects. Venous sinuses: Patent. Anatomic variants: None significant. No aneurysm or vascular malformation. Delayed phase: No abnormal enhancement. Review of the MIP images confirms the above findings IMPRESSION: 1. Negative CTA of the head and neck. No evidence for dissection or other acute vascular abnormality. No large vessel occlusion. 2. Atheromatous plaque within the carotid siphons with associated moderate stenoses. No other hemodynamically significant or correctable stenosis identified. Electronically Signed   By: Jeannine Boga M.D.   On: 05/21/2018 03:53    ____________________________________________   PROCEDURES  Procedure(s) performed: None  Procedures  Critical Care performed: No  ____________________________________________   INITIAL IMPRESSION / ASSESSMENT AND PLAN / ED COURSE  As part of my medical decision making, I reviewed the following data within the [place] History obtained from family, Nursing notes reviewed and incorporated, Labs reviewed, EKG interpreted, Old chart reviewed, Radiograph reviewed and Notes from prior ED  visits   57 year old female with diabetes, CAD, history of CVA who presents with headache. Differential diagnosis includes, but is not limited to, intracranial hemorrhage, meningitis/encephalitis, previous head trauma, cavernous venous thrombosis, tension headache, temporal arteritis, migraine or migraine equivalent, idiopathic intracranial hypertension, and non-specific headache.   Initial laboratory, head CT and chest x-ray results unremarkable.  No focal neurological deficits on examination; supple neck without meningismus.  However, given patient's complex medical history, coupled with her acute headache, will obtain CTA of her head and neck.  Will initiate IV fluids, IV Compazine and Benadryl for headache.   Clinical Course as of May 21 634  Mon May 21, 2018  0453 Patient sleeping soundly in no acute distress.  Updated husband of CT imaging result.  Will repeat timed troponin.   [JS]  [id number] Patient remains asleep.  Third troponin unremarkable.  Will discharge home on Compazine and patient will follow-up closely with her PCP.  Strict return precautions given.  Both verbalize understanding and agree with plan of care.   [JS]    Clinical Course User Index [JS] Paulette Blanch, MD     ____________________________________________   FINAL CLINICAL IMPRESSION(S) / ED DIAGNOSES  Final diagnoses:  Bad headache     ED Discharge Orders        Ordered    prochlorperazine (COMPAZINE) 10 MG tablet  Every 6 hours PRN     05/21/18 0634       Note:  This document was prepared using Dragon voice recognition software and may include unintentional dictation errors.    Paulette Blanch, MD 05/21/18 [phone]

## 2018-05-21 NOTE — ED Notes (Signed)
Pt transported to CT ?

## 2018-05-21 NOTE — ED Notes (Signed)
Pt reports headache to her right side of her head down her neck into her jaw and into her shoulder. Pt alert and oriented x 4. Neuro check with in normal limits but left side is slightly weaker than the right.

## 2018-08-31 ENCOUNTER — Emergency Department
Admission: EM | Admit: 2018-08-31 | Discharge: 2018-08-31 | Disposition: A | Discharge status: Home / Self Care | Payer: Medicare Other | Attending: Emergency Medicine | Admitting: Emergency Medicine

## 2018-08-31 ENCOUNTER — Other Ambulatory Visit: Payer: Self-pay

## 2018-08-31 ENCOUNTER — Emergency Department: Payer: Medicare Other

## 2018-08-31 DIAGNOSIS — I2510 Atherosclerotic heart disease of native coronary artery without angina pectoris: Secondary | ICD-10-CM | POA: Diagnosis not present

## 2018-08-31 DIAGNOSIS — Z79899 Other long term (current) drug therapy: Secondary | ICD-10-CM | POA: Diagnosis not present

## 2018-08-31 DIAGNOSIS — Z7982 Long term (current) use of aspirin: Secondary | ICD-10-CM | POA: Insufficient documentation

## 2018-08-31 DIAGNOSIS — E119 Type 2 diabetes mellitus without complications: Secondary | ICD-10-CM | POA: Diagnosis not present

## 2018-08-31 DIAGNOSIS — J0100 Acute maxillary sinusitis, unspecified: Secondary | ICD-10-CM | POA: Diagnosis not present

## 2018-08-31 DIAGNOSIS — Z7984 Long term (current) use of oral hypoglycemic drugs: Secondary | ICD-10-CM | POA: Insufficient documentation

## 2018-08-31 DIAGNOSIS — Z951 Presence of aortocoronary bypass graft: Secondary | ICD-10-CM | POA: Diagnosis not present

## 2018-08-31 DIAGNOSIS — J209 Acute bronchitis, unspecified: Secondary | ICD-10-CM | POA: Insufficient documentation

## 2018-08-31 DIAGNOSIS — Z87891 Personal history of nicotine dependence: Secondary | ICD-10-CM | POA: Diagnosis not present

## 2018-08-31 DIAGNOSIS — R509 Fever, unspecified: Secondary | ICD-10-CM | POA: Diagnosis present

## 2018-08-31 LAB — CBC WITH DIFFERENTIAL/PLATELET
Basophils Absolute: 0.1 10*3/uL (ref 0–0.1)
Basophils Relative: 1 %
Eosinophils Absolute: 0.1 10*3/uL (ref 0–0.7)
Eosinophils Relative: 1 %
HCT: 39.5 % (ref 35.0–47.0)
Hemoglobin: 13.5 g/dL (ref 12.0–16.0)
Lymphocytes Relative: 16 %
Lymphs Abs: 1.6 10*3/uL (ref 1.0–3.6)
MCH: 27.8 pg (ref 26.0–34.0)
MCHC: 34.1 g/dL (ref 32.0–36.0)
MCV: 81.5 fL (ref 80.0–100.0)
Monocytes Absolute: 0.7 10*3/uL (ref 0.2–0.9)
Monocytes Relative: 7 %
Neutro Abs: 7.8 10*3/uL — ABNORMAL HIGH (ref 1.4–6.5)
Neutrophils Relative %: 75 %
Platelets: 311 10*3/uL (ref 150–440)
RBC: 4.85 MIL/uL (ref 3.80–5.20)
RDW: 15 % — ABNORMAL HIGH (ref 11.5–14.5)
WBC: 10.2 10*3/uL (ref 3.6–11.0)

## 2018-08-31 LAB — COMPREHENSIVE METABOLIC PANEL
ALT: 16 U/L (ref 0–44)
AST: 19 U/L (ref 15–41)
Albumin: 3.8 g/dL (ref 3.5–5.0)
Alkaline Phosphatase: 81 U/L (ref 38–126)
Anion gap: 8 (ref 5–15)
BUN: 9 mg/dL (ref 6–20)
CO2: 28 mmol/L (ref 22–32)
Calcium: 9.1 mg/dL (ref 8.9–10.3)
Chloride: 102 mmol/L (ref 98–111)
Creatinine, Ser: 0.92 mg/dL (ref 0.44–1.00)
GFR calc Af Amer: >60 mL/min (ref >60)
GFR calc non Af Amer: >60 mL/min (ref >60)
Glucose, Bld: 133 mg/dL — ABNORMAL HIGH (ref 70–99)
Potassium: 4 mmol/L (ref 3.5–5.1)
Sodium: 138 mmol/L (ref 135–145)
Total Bilirubin: 0.7 mg/dL (ref 0.3–1.2)
Total Protein: 7.3 g/dL (ref 6.5–8.1)

## 2018-08-31 LAB — INFLUENZA PANEL BY PCR (TYPE A & B)
Influenza A By PCR: NEGATIVE
Influenza B By PCR: NEGATIVE

## 2018-08-31 MED ORDER — ACETAMINOPHEN 500 MG PO TABS
ORAL_TABLET | ORAL | Status: AC
  Filled 2018-08-31: qty 2

## 2018-08-31 MED ORDER — AZITHROMYCIN 250 MG PO TABS: ORAL_TABLET | ORAL | 0 refills | Status: AC

## 2018-08-31 MED ORDER — ACETAMINOPHEN 500 MG PO TABS
1000.0000 mg | ORAL_TABLET | Freq: Once | ORAL | Status: AC
  Administered 2018-08-31: 1000 mg via ORAL

## 2018-08-31 MED ORDER — PREDNISONE 50 MG PO TABS: ORAL_TABLET | ORAL | 0 refills | Status: DC

## 2018-08-31 NOTE — ED Notes (Signed)
Pt family member asked about how much longer it would be to be seen, this Probation officer informed pt about wait, stating that we are still waitng on labs to result and it should be any moment. Pt stated that she is getting inpatient.

## 2018-08-31 NOTE — ED Notes (Signed)
Pt verbalizes understanding of discharge instructions.

## 2018-08-31 NOTE — ED Provider Notes (Signed)
[facility] Emergency Department Provider Note  ____________________________________________  Time seen: Approximately 8:55 PM  I have reviewed the triage vital signs and the nursing notes.   HISTORY  Chief Complaint Generalized Body Aches; Chills; Nausea; and Fever    HPI Kayla Malone is a 57 y.o. female presents to the emergency department with fever, subjective weakness, chills and intermittent cough for approximately 1 week.  Patient has had some mild pharyngitis and myalgias but no rhinorrhea or congestion.  She has had no diarrhea or constipation.  No emesis.  Patient's grandson was sick earlier in the month but she has otherwise had no other sick contacts.  She denies chest pain and chest tightness.  No alleviating measures have been attempted.    Past Medical History:  Diagnosis Date  . Asthma   . Coronary artery disease   . Depression   . Diabetes mellitus without complication ([place])   . GERD (gastroesophageal reflux disease)   . History of hiatal hernia   . Hypertension   . Pain    BACK  . Stroke ([place])    x3  . Thyroid cyst     Patient Active Problem List   Diagnosis Date Noted  . Hx of CABG 10/30/2017  . Coronary artery disease 10/23/2017  . Unstable angina ([place]) 10/20/2017  . Cerebral artery occlusion with cerebral infarction ([place]) 08/06/2017  . Chest pain, unspecified 08/06/2017  . Diabetes ([place]) 08/06/2017  . Hyperlipidemia, unspecified 08/06/2017  . HTN (hypertension) 08/06/2017  . Lumbar radiculopathy 02/15/2017  . Adenomatous colon polyp 02/20/2014  . Benign neoplasm of colon, unspecified 02/20/2014  . Asthma 09/13/2013  . GERD (gastroesophageal reflux disease) 09/13/2013  . CVA, old, hemiparesis ([place]) 09/13/2013    Past Surgical History:  Procedure Laterality Date  . ABDOMINAL HYSTERECTOMY    . CARDIAC SURGERY    . CATARACT EXTRACTION W/PHACO Left 08/15/2017   Procedure: CATARACT EXTRACTION PHACO AND INTRAOCULAR  LENS PLACEMENT (IOC);  Surgeon: Birder Robson, MD;  Location: [facility] ORS;  Service: Ophthalmology;  Laterality: Left;  [place] 00:40.6 AP% 7.3 CDE 2.98 FLUID PACK LOT # [id number]  . CORONARY ARTERY BYPASS GRAFT N/A 10/23/2017   Procedure: CORONARY ARTERY BYPASS GRAFTING (CABG) x two  , using left internal mammary artery and right leg greater saphenous vein harvested  endoscopically - LIMA to LAD, SVG to RCA;  Surgeon: Grace Isaac, MD;  Location: [place];  Service: Open Heart Surgery;  Laterality: N/A;  . EYE SURGERY    . LEFT HEART CATH AND CORONARY ANGIOGRAPHY Left 10/09/2017   Procedure: LEFT HEART CATH AND CORONARY ANGIOGRAPHY;  Surgeon: Dionisio [name], MD;  Location: [place] [facility];  Service: Cardiovascular;  Laterality: Left;  . TEE WITHOUT CARDIOVERSION N/A 10/23/2017   Procedure: TRANSESOPHAGEAL ECHOCARDIOGRAM (TEE);  Surgeon: Grace Isaac, MD;  Location: [place];  Service: Open Heart Surgery;  Laterality: N/A;    Prior to Admission medications   Medication Sig Start Date End Date Taking? Authorizing Provider  acetaminophen (TYLENOL) 500 MG tablet Take 1,000 mg by mouth every 6 (six) hours as needed for mild pain or headache.    [provider]  albuterol (PROVENTIL HFA;VENTOLIN HFA) 108 (90 Base) MCG/ACT inhaler Inhale 2 puffs into the lungs every 6 (six) hours as needed for wheezing or shortness of breath.     [provider]  aspirin EC 81 MG tablet Take 81 mg by mouth daily.    [provider]  atorvastatin (LIPITOR) 40 MG  tablet Take 40 mg by mouth daily.    [provider]  azithromycin (ZITHROMAX Z-PAK) 250 MG tablet Take 2 tablets (500 mg) on  Day 1,  followed by 1 tablet (250 mg) once daily on Days 2 through 5. 08/31/18 09/05/18  Lannie Fields, PA-C  buPROPion (ZYBAN) 150 MG 12 hr tablet Take 150 mg by mouth 2 (two) times daily. 02/19/17   [provider]  fluticasone (FLONASE) 50 MCG/ACT nasal spray Place 2 sprays into both  nostrils daily.    [provider]  Fluticasone-Salmeterol (ADVAIR DISKUS) 500-50 MCG/DOSE AEPB Inhale 1 puff into the lungs daily as needed (SOB).     [provider]  hydrochlorothiazide (MICROZIDE) 12.5 MG capsule Take 12.5 mg by mouth. 03/19/12   [provider]  metFORMIN (GLUCOPHAGE) 500 MG tablet Take 500 mg by mouth 3 (three) times daily.  02/17/17   [provider]  metoprolol tartrate (LOPRESSOR) 25 MG tablet Take 1 tablet (25 mg total) 2 (two) times daily by mouth. 10/28/17   Elgie Collard, PA-C  mirtazapine (REMERON) 15 MG tablet TAKE 1 TABLET BY MOUTH EVERYDAY AT BEDTIME 12/18/17   [provider]  oxyCODONE (OXY IR/ROXICODONE) 5 MG immediate release tablet Take 1 tablet (5 mg total) every 4 (four) hours as needed by mouth for severe pain. Patient not taking: Reported on 12/28/2017 10/28/17   Elgie Collard, PA-C  pantoprazole (PROTONIX) 40 MG tablet Take 40 mg by mouth.     [provider]  predniSONE (DELTASONE) 50 MG tablet Take one 50 mg tablet once daily for the next five days. 08/31/18   Lannie Fields, PA-C  prochlorperazine (COMPAZINE) 10 MG tablet Take 1 tablet (10 mg total) by mouth every 6 (six) hours as needed for nausea (headache). 05/21/18   Paulette Blanch, MD  sitaGLIPtin (JANUVIA) 50 MG tablet Take 50 mg by mouth daily.    [provider]  traZODone (DESYREL) 50 MG tablet Take 50 mg by mouth at bedtime. 02/19/17   [provider]  vitamin B-12 (CYANOCOBALAMIN) 1000 MCG tablet Take by mouth.    [provider]  Vitamin D, Ergocalciferol, (DRISDOL) 50000 units CAPS capsule Take 50,000 Units every 7 (seven) days by mouth. tuesday    [provider]  VOLTAREN 1 % GEL APPLY 2 GRAMS TOPICALLY 4 TIMES DAILY AS NEEDED 11/07/17   [provider]    Allergies Lisinopril; Tramadol; Amoxicillin; and Clavulanic acid  Family History  Problem Relation Age of Onset  . Heart failure Mother   .  Pulmonary embolism Mother   . Cirrhosis Father   . Coronary artery disease Father   . Diabetes type II Father   . Hypertension Father   . Kidney disease Father   . Coronary artery disease Sister   . Stroke Sister   . COPD Brother   . Coronary artery disease Brother   . Breast cancer Neg Hx     Social History Social History   Tobacco Use  . Smoking status: Former Smoker    Last attempt to quit: 09/13/1998    Years since quitting: 19.9  . Smokeless tobacco: Never Used  Substance Use Topics  . Alcohol use: No  . Drug use: No     Review of Systems  Constitutional: Patient has fever.  Eyes: No visual changes. No discharge ENT: Patient has congestion.  Cardiovascular: no chest pain. Respiratory: Patient has cough.  Gastrointestinal: No abdominal pain.  No nausea, no vomiting.  Patient had diarrhea.  Genitourinary: Negative for dysuria. No hematuria Musculoskeletal: Patient has myalgias.  Skin: Negative for rash, abrasions, lacerations, ecchymosis. Neurological: Patient has headache, no focal weakness or numbness.    ____________________________________________   PHYSICAL EXAM:  VITAL SIGNS: ED Triage Vitals  Enc Vitals Group     BP 08/31/18 1815 (!) 151/74     Pulse Rate 08/31/18 1815 78     Resp 08/31/18 1815 18     Temp 08/31/18 1815 100.2 F (37.9 C)     Temp Source 08/31/18 1815 Oral     SpO2 08/31/18 1815 100 %     Weight 08/31/18 1816 153 lb (69.4 kg)     Height 08/31/18 1816 5\' 3"  (1.6 m)     Head Circumference --      Peak Flow --      Pain Score 08/31/18 1816 8     Pain Loc --      Pain Edu? --      Excl. in [place]? --      Constitutional: Alert and oriented. Patient is lying supine. Eyes: Conjunctivae are normal. PERRL. EOMI. Head: Atraumatic. Patient has maxillary and frontal sinus tenderness.  ENT:      Ears: Tympanic membranes are mildly injected with mild effusion bilaterally.       Nose: Nasal turbinates are edematous.      Mouth/Throat:  Mucous membranes are moist. Posterior pharynx is mildly erythematous.  Hematological/Lymphatic/Immunilogical: No cervical lymphadenopathy.  Cardiovascular: Normal rate, regular rhythm. Normal S1 and S2.  Good peripheral circulation. Respiratory: Normal respiratory effort without tachypnea or retractions. Lungs CTAB. Good air entry to the bases with no decreased or absent breath sounds. Gastrointestinal: Bowel sounds 4 quadrants. Soft and nontender to palpation. No guarding or rigidity. No palpable masses. No distention. No CVA tenderness. Musculoskeletal: Full range of motion to all extremities. No gross deformities appreciated. Neurologic:  Normal speech and language. No gross focal neurologic deficits are appreciated.  Skin:  Skin is warm, dry and intact. No rash noted. Psychiatric: Mood and affect are normal. Speech and behavior are normal. Patient exhibits appropriate insight and judgement.    ____________________________________________   LABS (all labs ordered are listed, but only abnormal results are displayed)  Labs Reviewed  CBC WITH DIFFERENTIAL/PLATELET - Abnormal; Notable for the following components:      Result Value   RDW 15.0 (*)    Neutro Abs 7.8 (*)    All other components within normal limits  COMPREHENSIVE METABOLIC PANEL - Abnormal; Notable for the following components:   Glucose, Bld 133 (*)    All other components within normal limits  INFLUENZA PANEL BY PCR (TYPE A & B)   ____________________________________________  EKG   ____________________________________________  RADIOLOGY I personally viewed and evaluated these images as part of my medical decision making, as well as reviewing the written report by the radiologist.  Dg Chest 2 View  Result Date: 08/31/2018 CLINICAL DATA:  Body aches fever and chills, nausea and fatigue EXAM: CHEST - 2 VIEW COMPARISON:  05/20/2018 FINDINGS: Post sternotomy changes. No focal opacity or pleural effusion. Normal  cardiomediastinal silhouette. No pneumothorax. IMPRESSION: No active cardiopulmonary disease. Electronically Signed   By: Donavan Foil M.D.   On: 08/31/2018 19:20    ____________________________________________    PROCEDURES  Procedure(s) performed:    Procedures    Medications  acetaminophen (TYLENOL) tablet 1,000 mg (1,000 mg Oral Given 08/31/18 2027)     ____________________________________________   INITIAL IMPRESSION / ASSESSMENT AND  PLAN / ED COURSE  Pertinent labs & imaging results that were available during my care of the patient were reviewed by me and considered in my medical decision making (see chart for details).  Review of the [place] CSRS was performed in accordance of the [place] prior to dispensing any controlled drugs.      Assessment and Plan:  Sinusitis Bronchitis Patient presents to the emergency department with subjective weakness, fever and body aches for 1 week.  Basic labs were obtained and were reassuring.  Patient tested negative for influenza.  No consolidations were visualized on chest x-ray that would suggest pneumonia.  Patient did have some maxillary and frontal sinus tenderness on physical exam.  Patient was treated empirically for a combination of bronchitis and sinusitis with azithromycin and prednisone.  Patient was advised to follow-up with primary care as needed.  All patient questions were answered.   ____________________________________________  FINAL CLINICAL IMPRESSION(S) / ED DIAGNOSES  Final diagnoses:  Acute bronchitis, unspecified organism  Acute maxillary sinusitis, recurrence not specified      NEW MEDICATIONS STARTED DURING THIS VISIT:  ED Discharge Orders         Ordered    azithromycin (ZITHROMAX Z-PAK) 250 MG tablet     08/31/18 2135    predniSONE (DELTASONE) 50 MG tablet     08/31/18 2135              This chart was dictated using voice recognition software/Dragon. Despite best efforts to proofread, errors  can occur which can change the meaning. Any change was purely unintentional.    Lannie Fields, PA-C 08/31/18 2204    Nena Polio, MD 09/01/18 0000

## 2018-08-31 NOTE — ED Triage Notes (Addendum)
Body aches, fever, chills, fatigue, nausea, congestion. Took tylenol at 12PM today. Sneezing. Mild expiratory wheezing with ambulation. Pt wearing mask.

## 2018-08-31 NOTE — ED Notes (Signed)
Provided pt with graham crackers and ginger ale no further needs

## 2018-08-31 NOTE — ED Notes (Signed)
See triage note  Presents with cold sx's sore throat and body aches   States sx's started about 1 week ago  Febrile on arrival

## 2018-09-23 DIAGNOSIS — I214 Non-ST elevation (NSTEMI) myocardial infarction: Secondary | ICD-10-CM | POA: Diagnosis present

## 2018-09-23 HISTORY — DX: Non-ST elevation (NSTEMI) myocardial infarction: I21.4

## 2018-09-23 NOTE — H&P (Signed)
CARDIOLOGY H&P  HPI: Kayla Malone is a 57 y.o. female w/ history of CVA, DM, HLD, HTN, CAD s/p CABG (2018) who presents w/ NSTEMI.   In brief, patient describes waxing and waning chest discomfort for the past several months now.  She has been followed closely by her outpatient doctors and has had several changes in her medications.  She has however been actively going to cardiac rehab and is been taking all of her medications as prescribed.  Today while at church, the patient developed central chest pressure worse than she had experienced over the past several months.  The pressure persisted so she called EMS and was brought to an outside hospital ED.  At the outside hospital ED, the patient was found to have an elevated troponin.  She was started on a heparin drip and treated with a full dose aspirin and Nitropaste.  Her chest pain resolved and she was transferred for further evaluation.  On arrival at [facility], the patient states she is no longer having chest discomfort.  Review of Systems:     Cardiac Review of Systems: {Y] = yes [ ]  = no  Chest Pain [ Y   ]  Resting SOB [   ] Exertional SOB  [  ]  Orthopnea [  ]   Pedal Edema [   ]    Palpitations [  ] Syncope  [  ]   Presyncope [   ]  General Review of Systems: [Y] = yes [  ]=no Constitional: recent weight change [  ]; anorexia [  ]; fatigue [  ]; nausea [  ]; night sweats [  ]; fever [  ]; or chills [  ];                                                                     Dental: poor dentition[  ];   Eye : blurred vision [  ]; diplopia [   ]; vision changes [  ];  Amaurosis fugax[  ]; Resp: cough [  ];  wheezing[  ];  hemoptysis[  ]; shortness of breath[  ]; paroxysmal nocturnal dyspnea[  ]; dyspnea on exertion[  ]; or orthopnea[  ];  GI:  gallstones[  ], vomiting[  ];  dysphagia[  ]; melena[  ];  hematochezia [  ]; heartburn[  ];   GU: kidney stones [  ]; hematuria[  ];   dysuria [  ];  nocturia[  ];               Skin:  rash [  ], swelling[  ];, hair loss[  ];  peripheral edema[  ];  or itching[  ]; Musculosketetal: myalgias[  ];  joint swelling[  ];  joint erythema[  ];  joint pain[  ];  back pain[  ];  Heme/Lymph: bruising[  ];  bleeding[  ];  anemia[  ];  Neuro: TIA[  ];  headaches[  ];  stroke[  ];  vertigo[  ];  seizures[  ];   paresthesias[  ];  difficulty walking[  ];  Psych:depression[  ]; anxiety[  ];  Endocrine: diabetes[  ];  thyroid dysfunction[  ];  Other:  Past Medical History:  Diagnosis  Date  . Asthma   . Coronary artery disease   . Depression   . Diabetes mellitus without complication ([place])   . GERD (gastroesophageal reflux disease)   . History of hiatal hernia   . Hypertension   . Pain    BACK  . Stroke ([place])    x3  . Thyroid cyst     Prior to Admission medications   Medication Sig Start Date End Date Taking? Authorizing Provider  acetaminophen (TYLENOL) 500 MG tablet Take 1,000 mg by mouth every 6 (six) hours as needed for mild pain or headache.    [provider]  albuterol (PROVENTIL HFA;VENTOLIN HFA) 108 (90 Base) MCG/ACT inhaler Inhale 2 puffs into the lungs every 6 (six) hours as needed for wheezing or shortness of breath.     [provider]  aspirin EC 81 MG tablet Take 81 mg by mouth daily.    [provider]  atorvastatin (LIPITOR) 40 MG tablet Take 40 mg by mouth daily.    [provider]  buPROPion (ZYBAN) 150 MG 12 hr tablet Take 150 mg by mouth 2 (two) times daily. 02/19/17   [provider]  fluticasone (FLONASE) 50 MCG/ACT nasal spray Place 2 sprays into both nostrils daily.    [provider]  Fluticasone-Salmeterol (ADVAIR DISKUS) 500-50 MCG/DOSE AEPB Inhale 1 puff into the lungs daily as needed (SOB).     [provider]  hydrochlorothiazide (MICROZIDE) 12.5 MG capsule Take 12.5 mg by mouth. 03/19/12   [provider]  metFORMIN (GLUCOPHAGE) 500 MG tablet Take 500 mg by mouth 3 (three) times  daily.  02/17/17   [provider]  metoprolol tartrate (LOPRESSOR) 25 MG tablet Take 1 tablet (25 mg total) 2 (two) times daily by mouth. 10/28/17   Elgie Collard, PA-C  mirtazapine (REMERON) 15 MG tablet TAKE 1 TABLET BY MOUTH EVERYDAY AT BEDTIME 12/18/17   [provider]  oxyCODONE (OXY IR/ROXICODONE) 5 MG immediate release tablet Take 1 tablet (5 mg total) every 4 (four) hours as needed by mouth for severe pain. Patient not taking: Reported on 12/28/2017 10/28/17   Elgie Collard, PA-C  pantoprazole (PROTONIX) 40 MG tablet Take 40 mg by mouth.     [provider]  predniSONE (DELTASONE) 50 MG tablet Take one 50 mg tablet once daily for the next five days. 08/31/18   Lannie Fields, PA-C  prochlorperazine (COMPAZINE) 10 MG tablet Take 1 tablet (10 mg total) by mouth every 6 (six) hours as needed for nausea (headache). 05/21/18   Paulette Blanch, MD  sitaGLIPtin (JANUVIA) 50 MG tablet Take 50 mg by mouth daily.    [provider]  traZODone (DESYREL) 50 MG tablet Take 50 mg by mouth at bedtime. 02/19/17   [provider]  vitamin B-12 (CYANOCOBALAMIN) 1000 MCG tablet Take by mouth.    [provider]  Vitamin D, Ergocalciferol, (DRISDOL) 50000 units CAPS capsule Take 50,000 Units every 7 (seven) days by mouth. tuesday    [provider]  VOLTAREN 1 % GEL APPLY 2 GRAMS TOPICALLY 4 TIMES DAILY AS NEEDED 11/07/17   [provider]      Allergies  Allergen Reactions  . Lisinopril   . Tramadol Other (See Comments)    sleepiness  . Amoxicillin Rash  . Clavulanic Acid Rash    Social History   Socioeconomic History  . Marital status: Single    Spouse name: Not on file  . Number of children:  Not on file  . Years of education: Not on file  . Highest education level: Not on file  Occupational History  . Not on file  Social Needs  . Financial resource strain: Not on file  . Food insecurity:    Worry: Not on file     Inability: Not on file  . Transportation needs:    Medical: Not on file    Non-medical: Not on file  Tobacco Use  . Smoking status: Former Smoker    Last attempt to quit: 09/13/1998    Years since quitting: 20.0  . Smokeless tobacco: Never Used  Substance and Sexual Activity  . Alcohol use: No  . Drug use: No  . Sexual activity: Yes    Birth control/protection: Post-menopausal  Lifestyle  . Physical activity:    Days per week: Not on file    Minutes per session: Not on file  . Stress: Not on file  Relationships  . Social connections:    Talks on phone: Not on file    Gets together: Not on file    Attends religious service: Not on file    Active member of club or organization: Not on file    Attends meetings of clubs or organizations: Not on file    Relationship status: Not on file  . Intimate partner violence:    Fear of current or ex partner: Not on file    Emotionally abused: Not on file    Physically abused: Not on file    Forced sexual activity: Not on file  Other Topics Concern  . Not on file  Social History Narrative  . Not on file    Family History  Problem Relation Age of Onset  . Heart failure Mother   . Pulmonary embolism Mother   . Cirrhosis Father   . Coronary artery disease Father   . Diabetes type II Father   . Hypertension Father   . Kidney disease Father   . Coronary artery disease Sister   . Stroke Sister   . COPD Brother   . Coronary artery disease Brother   . Breast cancer Neg Hx     PHYSICAL EXAM: Vitals:   09/24/18 0105  BP: (!) 149/82  Pulse: 62  Resp: 18  Temp: 98.4 F (36.9 C)  SpO2: 100%   General:  Well appearing. No respiratory difficulty HEENT: normal Neck: supple. no JVD. Carotids 2+ bilat; no bruits. No lymphadenopathy or thryomegaly appreciated. Cor: PMI nondisplaced. Regular rate & rhythm. No rubs, gallops or murmurs. Well healing sternotomy scar.  Lungs: clear Abdomen: soft, nontender, nondistended. No  hepatosplenomegaly. No bruits or masses. Good bowel sounds. Extremities: no cyanosis, clubbing, rash, edema Neuro: alert & oriented x 3, cranial nerves grossly intact. moves all 4 extremities w/o difficulty. Affect pleasant.  ECG: not yet performed  No results found for this or any previous visit (from the past 24 hour(s)). No results found.   ASSESSMENT: Kayla Malone is a 57 y.o. female w/ history of CVA, DM, HLD, HTN, CAD s/p CABG (2018) who presents w/ NSTEMI.   PLAN/DISCUSSION: #) NSTEMI - repeat troponin q6h x 2 - NPO for cath in AM - check lipids, A1c - ASA 81mg  daily - heparin drip for ACS per pharmacy protocol - cont home atorvastatin 80mg  QHS - cont home metop XL 50mg  daily - may benefit from starting ACE for better BP control - SLN, nitro gtt PRN - defer P2Y12 until after cath - echo in AM -  cardiac rehab  #) DM:  - cont home sitagliptin, will give first dose on 10/15 since she is currently NPO for cath - hold metformin for cath, resume 10/15  Marcie Mowers, MD Cardiology Fellow, PGY-6

## 2018-09-24 ENCOUNTER — Encounter (HOSPITAL_COMMUNITY): Payer: Self-pay | Admitting: *Deleted

## 2018-09-24 ENCOUNTER — Inpatient Hospital Stay (HOSPITAL_COMMUNITY): Payer: Medicare Other

## 2018-09-24 ENCOUNTER — Other Ambulatory Visit: Payer: Self-pay

## 2018-09-24 ENCOUNTER — Encounter (HOSPITAL_COMMUNITY)
Admission: AD | Disposition: A | Discharge status: Home / Self Care | Payer: Self-pay | Source: Other Acute Inpatient Hospital | Attending: Internal Medicine

## 2018-09-24 ENCOUNTER — Inpatient Hospital Stay (HOSPITAL_COMMUNITY)
Admission: AD | Admit: 2018-09-24 | Discharge: 2018-09-25 | DRG: 247 | Disposition: A | Discharge status: Home / Self Care | Payer: Medicare Other | Source: Other Acute Inpatient Hospital | Attending: Internal Medicine | Admitting: Internal Medicine

## 2018-09-24 DIAGNOSIS — I214 Non-ST elevation (NSTEMI) myocardial infarction: Principal | ICD-10-CM | POA: Diagnosis present

## 2018-09-24 DIAGNOSIS — K219 Gastro-esophageal reflux disease without esophagitis: Secondary | ICD-10-CM | POA: Diagnosis not present

## 2018-09-24 DIAGNOSIS — I5030 Unspecified diastolic (congestive) heart failure: Secondary | ICD-10-CM | POA: Diagnosis not present

## 2018-09-24 DIAGNOSIS — I69359 Hemiplegia and hemiparesis following cerebral infarction affecting unspecified side: Secondary | ICD-10-CM

## 2018-09-24 DIAGNOSIS — E1165 Type 2 diabetes mellitus with hyperglycemia: Secondary | ICD-10-CM

## 2018-09-24 DIAGNOSIS — E119 Type 2 diabetes mellitus without complications: Secondary | ICD-10-CM | POA: Diagnosis not present

## 2018-09-24 DIAGNOSIS — J45909 Unspecified asthma, uncomplicated: Secondary | ICD-10-CM | POA: Diagnosis present

## 2018-09-24 DIAGNOSIS — E785 Hyperlipidemia, unspecified: Secondary | ICD-10-CM | POA: Diagnosis present

## 2018-09-24 DIAGNOSIS — I119 Hypertensive heart disease without heart failure: Secondary | ICD-10-CM | POA: Diagnosis present

## 2018-09-24 DIAGNOSIS — Z833 Family history of diabetes mellitus: Secondary | ICD-10-CM

## 2018-09-24 DIAGNOSIS — Z87891 Personal history of nicotine dependence: Secondary | ICD-10-CM | POA: Diagnosis not present

## 2018-09-24 DIAGNOSIS — E782 Mixed hyperlipidemia: Secondary | ICD-10-CM | POA: Diagnosis present

## 2018-09-24 DIAGNOSIS — Z8249 Family history of ischemic heart disease and other diseases of the circulatory system: Secondary | ICD-10-CM

## 2018-09-24 DIAGNOSIS — I25110 Atherosclerotic heart disease of native coronary artery with unstable angina pectoris: Secondary | ICD-10-CM | POA: Diagnosis present

## 2018-09-24 DIAGNOSIS — F329 Major depressive disorder, single episode, unspecified: Secondary | ICD-10-CM | POA: Diagnosis present

## 2018-09-24 DIAGNOSIS — R079 Chest pain, unspecified: Secondary | ICD-10-CM | POA: Diagnosis present

## 2018-09-24 DIAGNOSIS — Z79899 Other long term (current) drug therapy: Secondary | ICD-10-CM

## 2018-09-24 DIAGNOSIS — Z888 Allergy status to other drugs, medicaments and biological substances status: Secondary | ICD-10-CM | POA: Diagnosis not present

## 2018-09-24 DIAGNOSIS — I25118 Atherosclerotic heart disease of native coronary artery with other forms of angina pectoris: Secondary | ICD-10-CM | POA: Diagnosis present

## 2018-09-24 DIAGNOSIS — Z7984 Long term (current) use of oral hypoglycemic drugs: Secondary | ICD-10-CM

## 2018-09-24 DIAGNOSIS — Z951 Presence of aortocoronary bypass graft: Secondary | ICD-10-CM

## 2018-09-24 DIAGNOSIS — Z955 Presence of coronary angioplasty implant and graft: Secondary | ICD-10-CM

## 2018-09-24 DIAGNOSIS — I2510 Atherosclerotic heart disease of native coronary artery without angina pectoris: Secondary | ICD-10-CM | POA: Diagnosis present

## 2018-09-24 DIAGNOSIS — I10 Essential (primary) hypertension: Secondary | ICD-10-CM | POA: Diagnosis present

## 2018-09-24 DIAGNOSIS — Z7952 Long term (current) use of systemic steroids: Secondary | ICD-10-CM

## 2018-09-24 DIAGNOSIS — Z7982 Long term (current) use of aspirin: Secondary | ICD-10-CM

## 2018-09-24 HISTORY — PX: LEFT HEART CATH AND CORS/GRAFTS ANGIOGRAPHY: CATH118250

## 2018-09-24 HISTORY — PX: CORONARY STENT INTERVENTION: CATH118234

## 2018-09-24 LAB — CBC WITH DIFFERENTIAL/PLATELET
Abs Immature Granulocytes: 0.02 10*3/uL (ref 0.00–0.07)
BASOS PCT: 1 %
Basophils Absolute: 0.1 10*3/uL (ref 0.0–0.1)
EOS PCT: 1 %
Eosinophils Absolute: 0.1 10*3/uL (ref 0.0–0.5)
HCT: 40.1 % (ref 36.0–46.0)
Hemoglobin: 12.5 g/dL (ref 12.0–15.0)
Immature Granulocytes: 0 %
Lymphocytes Relative: 45 %
Lymphs Abs: 3 10*3/uL (ref 0.7–4.0)
MCH: 25.7 pg — AB (ref 26.0–34.0)
MCHC: 31.2 g/dL (ref 30.0–36.0)
MCV: 82.5 fL (ref 80.0–100.0)
MONO ABS: 0.4 10*3/uL (ref 0.1–1.0)
Monocytes Relative: 6 %
Neutro Abs: 3.1 10*3/uL (ref 1.7–7.7)
Neutrophils Relative %: 47 %
PLATELETS: 288 10*3/uL (ref 150–400)
RBC: 4.86 MIL/uL (ref 3.87–5.11)
RDW: 13.4 % (ref 11.5–15.5)
WBC: 6.7 10*3/uL (ref 4.0–10.5)
nRBC: 0 % (ref 0.0–0.2)

## 2018-09-24 LAB — ECHOCARDIOGRAM COMPLETE
Height: 63 in
Weight: 2363.2 oz

## 2018-09-24 LAB — COMPREHENSIVE METABOLIC PANEL
ALBUMIN: 3.6 g/dL (ref 3.5–5.0)
ALT: 13 U/L (ref 0–44)
ANION GAP: 9 (ref 5–15)
AST: 16 U/L (ref 15–41)
Alkaline Phosphatase: 59 U/L (ref 38–126)
BUN: 5 mg/dL — ABNORMAL LOW (ref 6–20)
CHLORIDE: 107 mmol/L (ref 98–111)
CO2: 25 mmol/L (ref 22–32)
CREATININE: 0.81 mg/dL (ref 0.44–1.00)
Calcium: 8.8 mg/dL — ABNORMAL LOW (ref 8.9–10.3)
GFR calc non Af Amer: >60 mL/min (ref >60)
Glucose, Bld: 116 mg/dL — ABNORMAL HIGH (ref 70–99)
Potassium: 3.5 mmol/L (ref 3.5–5.1)
SODIUM: 141 mmol/L (ref 135–145)
Total Bilirubin: 0.8 mg/dL (ref 0.3–1.2)
Total Protein: 6.3 g/dL — ABNORMAL LOW (ref 6.5–8.1)

## 2018-09-24 LAB — POCT ACTIVATED CLOTTING TIME
Activated Clotting Time: 285 seconds
Activated Clotting Time: 329 seconds

## 2018-09-24 LAB — GLUCOSE, CAPILLARY
GLUCOSE-CAPILLARY: 74 mg/dL (ref 70–99)
Glucose-Capillary: 170 mg/dL — ABNORMAL HIGH (ref 70–99)

## 2018-09-24 LAB — BRAIN NATRIURETIC PEPTIDE: B Natriuretic Peptide: 91.2 pg/mL (ref 0.0–100.0)

## 2018-09-24 LAB — TSH: TSH: 1.923 u[IU]/mL (ref 0.350–4.500)

## 2018-09-24 LAB — PROTIME-INR
INR: 1.07
PROTHROMBIN TIME: 13.9 s (ref 11.4–15.2)

## 2018-09-24 LAB — MAGNESIUM: Magnesium: 1.3 mg/dL — ABNORMAL LOW (ref 1.7–2.4)

## 2018-09-24 LAB — TROPONIN I
TROPONIN I: 0.1 ng/mL — AB (ref <0.03)
TROPONIN I: 0.07 ng/mL — AB (ref <0.03)
Troponin I: 0.09 ng/mL (ref <0.03)

## 2018-09-24 LAB — HEPARIN LEVEL (UNFRACTIONATED): HEPARIN UNFRACTIONATED: 0.6 [IU]/mL (ref 0.30–0.70)

## 2018-09-24 LAB — APTT: aPTT: 118 seconds — ABNORMAL HIGH (ref 24–36)

## 2018-09-24 SURGERY — LEFT HEART CATH AND CORS/GRAFTS ANGIOGRAPHY
Anesthesia: LOCAL

## 2018-09-24 MED ORDER — HEPARIN (PORCINE) IN NACL 1000-0.9 UT/500ML-% IV SOLN
INTRAVENOUS | Status: DC | PRN
  Administered 2018-09-24 (×2): 500 mL

## 2018-09-24 MED ORDER — MIDAZOLAM HCL 2 MG/2ML IJ SOLN
INTRAMUSCULAR | Status: AC
  Filled 2018-09-24: qty 2

## 2018-09-24 MED ORDER — MAGNESIUM SULFATE 2 GM/50ML IV SOLN
INTRAVENOUS | Status: AC
  Filled 2018-09-24: qty 50

## 2018-09-24 MED ORDER — PANTOPRAZOLE SODIUM 40 MG PO TBEC
40.0000 mg | DELAYED_RELEASE_TABLET | Freq: Every day | ORAL | Status: DC
  Administered 2018-09-24 – 2018-09-25 (×2): 40 mg via ORAL
  Filled 2018-09-24 (×2): qty 1

## 2018-09-24 MED ORDER — SODIUM CHLORIDE 0.9% FLUSH: 3.0000 mL | INTRAVENOUS | Status: DC | PRN

## 2018-09-24 MED ORDER — TRAZODONE HCL 50 MG PO TABS
50.0000 mg | ORAL_TABLET | Freq: Every day | ORAL | Status: DC
  Administered 2018-09-24: 50 mg via ORAL
  Filled 2018-09-24 (×2): qty 1

## 2018-09-24 MED ORDER — HEPARIN SODIUM (PORCINE) 1000 UNIT/ML IJ SOLN
INTRAMUSCULAR | Status: AC
  Filled 2018-09-24: qty 1

## 2018-09-24 MED ORDER — ANGIOPLASTY BOOK
Freq: Once | Status: AC
  Administered 2018-09-24: 1
  Filled 2018-09-24: qty 1

## 2018-09-24 MED ORDER — NITROGLYCERIN 0.4 MG SL SUBL: 0.4000 mg | SUBLINGUAL_TABLET | SUBLINGUAL | Status: DC | PRN

## 2018-09-24 MED ORDER — ASPIRIN EC 81 MG PO TBEC: 81.0000 mg | DELAYED_RELEASE_TABLET | Freq: Every day | ORAL | Status: DC

## 2018-09-24 MED ORDER — LINAGLIPTIN 5 MG PO TABS
5.0000 mg | ORAL_TABLET | Freq: Every day | ORAL | Status: DC
  Administered 2018-09-24 – 2018-09-25 (×2): 5 mg via ORAL
  Filled 2018-09-24 (×2): qty 1

## 2018-09-24 MED ORDER — MIDAZOLAM HCL 2 MG/2ML IJ SOLN
INTRAMUSCULAR | Status: DC | PRN
  Administered 2018-09-24: 1 mg via INTRAVENOUS
  Administered 2018-09-24: 2 mg via INTRAVENOUS
  Administered 2018-09-24 (×2): 1 mg via INTRAVENOUS

## 2018-09-24 MED ORDER — MOMETASONE FURO-FORMOTEROL FUM 200-5 MCG/ACT IN AERO
2.0000 | INHALATION_SPRAY | Freq: Two times a day (BID) | RESPIRATORY_TRACT | Status: DC
  Administered 2018-09-24 – 2018-09-25 (×3): 2 via RESPIRATORY_TRACT
  Filled 2018-09-24: qty 8.8

## 2018-09-24 MED ORDER — ASPIRIN 81 MG PO CHEW: 81.0000 mg | CHEWABLE_TABLET | Freq: Every day | ORAL | Status: DC

## 2018-09-24 MED ORDER — ACETAMINOPHEN 325 MG PO TABS: 650.0000 mg | ORAL_TABLET | ORAL | Status: DC | PRN

## 2018-09-24 MED ORDER — VERAPAMIL HCL 2.5 MG/ML IV SOLN
INTRAVENOUS | Status: AC
  Filled 2018-09-24: qty 2

## 2018-09-24 MED ORDER — METOPROLOL SUCCINATE ER 50 MG PO TB24
50.0000 mg | ORAL_TABLET | Freq: Every day | ORAL | Status: DC
  Administered 2018-09-25: 50 mg via ORAL
  Filled 2018-09-24 (×2): qty 1

## 2018-09-24 MED ORDER — ASPIRIN EC 81 MG PO TBEC
81.0000 mg | DELAYED_RELEASE_TABLET | Freq: Every day | ORAL | Status: DC
  Administered 2018-09-24 – 2018-09-25 (×2): 81 mg via ORAL
  Filled 2018-09-24 (×2): qty 1

## 2018-09-24 MED ORDER — SODIUM CHLORIDE 0.9 % WEIGHT BASED INFUSION
1.0000 mL/kg/h | INTRAVENOUS | Status: DC
  Administered 2018-09-24: 1 mL/kg/h via INTRAVENOUS

## 2018-09-24 MED ORDER — SODIUM CHLORIDE 0.9 % WEIGHT BASED INFUSION: 3.0000 mL/kg/h | INTRAVENOUS | Status: DC

## 2018-09-24 MED ORDER — SODIUM CHLORIDE 0.9 % IV SOLN: INTRAVENOUS | Status: AC

## 2018-09-24 MED ORDER — FENTANYL CITRATE (PF) 100 MCG/2ML IJ SOLN
INTRAMUSCULAR | Status: AC
  Filled 2018-09-24: qty 2

## 2018-09-24 MED ORDER — SODIUM CHLORIDE 0.9 % IV SOLN: 250.0000 mL | INTRAVENOUS | Status: DC | PRN

## 2018-09-24 MED ORDER — ATORVASTATIN CALCIUM 80 MG PO TABS
80.0000 mg | ORAL_TABLET | Freq: Every day | ORAL | Status: DC
  Administered 2018-09-24 – 2018-09-25 (×2): 80 mg via ORAL
  Filled 2018-09-24 (×2): qty 1

## 2018-09-24 MED ORDER — HYDRALAZINE HCL 20 MG/ML IJ SOLN: 5.0000 mg | INTRAMUSCULAR | Status: AC | PRN

## 2018-09-24 MED ORDER — ACETAMINOPHEN 325 MG PO TABS
650.0000 mg | ORAL_TABLET | ORAL | Status: DC | PRN
  Administered 2018-09-24 – 2018-09-25 (×2): 650 mg via ORAL
  Filled 2018-09-24 (×2): qty 2

## 2018-09-24 MED ORDER — HEPARIN SODIUM (PORCINE) 1000 UNIT/ML IJ SOLN
INTRAMUSCULAR | Status: DC | PRN
  Administered 2018-09-24: 3500 [IU] via INTRAVENOUS
  Administered 2018-09-24: 4000 [IU] via INTRAVENOUS
  Administered 2018-09-24: 3000 [IU] via INTRAVENOUS
  Administered 2018-09-24: 4000 [IU] via INTRAVENOUS

## 2018-09-24 MED ORDER — THE SENSUOUS HEART BOOK
Freq: Once | Status: AC
  Administered 2018-09-24: 1
  Filled 2018-09-24: qty 1

## 2018-09-24 MED ORDER — ASPIRIN 81 MG PO CHEW: 81.0000 mg | CHEWABLE_TABLET | ORAL | Status: DC

## 2018-09-24 MED ORDER — MAGNESIUM SULFATE 2 GM/50ML IV SOLN
2.0000 g | Freq: Once | INTRAVENOUS | Status: AC
  Administered 2018-09-24: 2 g via INTRAVENOUS
  Filled 2018-09-24: qty 50

## 2018-09-24 MED ORDER — IOHEXOL 350 MG/ML SOLN
INTRAVENOUS | Status: DC | PRN
  Administered 2018-09-24: 120 mL via INTRA_ARTERIAL

## 2018-09-24 MED ORDER — HEPARIN (PORCINE) IN NACL 100-0.45 UNIT/ML-% IJ SOLN: 800.0000 [IU]/h | INTRAMUSCULAR | Status: DC

## 2018-09-24 MED ORDER — LIDOCAINE HCL (PF) 1 % IJ SOLN
INTRAMUSCULAR | Status: DC | PRN
  Administered 2018-09-24: 2 mL

## 2018-09-24 MED ORDER — HEART ATTACK BOUNCING BOOK
Freq: Once | Status: AC
  Administered 2018-09-24: 1
  Filled 2018-09-24: qty 1

## 2018-09-24 MED ORDER — SODIUM CHLORIDE 0.9% FLUSH
3.0000 mL | Freq: Two times a day (BID) | INTRAVENOUS | Status: DC
  Administered 2018-09-24 – 2018-09-25 (×2): 3 mL via INTRAVENOUS

## 2018-09-24 MED ORDER — TICAGRELOR 90 MG PO TABS
ORAL_TABLET | ORAL | Status: DC | PRN
  Administered 2018-09-24: 180 mg via ORAL

## 2018-09-24 MED ORDER — ONDANSETRON HCL 4 MG/2ML IJ SOLN: 4.0000 mg | Freq: Four times a day (QID) | INTRAMUSCULAR | Status: DC | PRN

## 2018-09-24 MED ORDER — SODIUM CHLORIDE 0.9% FLUSH: 3.0000 mL | Freq: Two times a day (BID) | INTRAVENOUS | Status: DC

## 2018-09-24 MED ORDER — FLUTICASONE PROPIONATE 50 MCG/ACT NA SUSP
2.0000 | Freq: Every day | NASAL | Status: DC
  Administered 2018-09-24 – 2018-09-25 (×2): 2 via NASAL
  Filled 2018-09-24: qty 16

## 2018-09-24 MED ORDER — TICAGRELOR 90 MG PO TABS
90.0000 mg | ORAL_TABLET | Freq: Two times a day (BID) | ORAL | Status: DC
  Administered 2018-09-25: 90 mg via ORAL
  Filled 2018-09-24: qty 1

## 2018-09-24 MED ORDER — TICAGRELOR 90 MG PO TABS
ORAL_TABLET | ORAL | Status: AC
  Filled 2018-09-24: qty 2

## 2018-09-24 MED ORDER — LABETALOL HCL 5 MG/ML IV SOLN: 10.0000 mg | INTRAVENOUS | Status: AC | PRN

## 2018-09-24 MED ORDER — ALBUTEROL SULFATE (2.5 MG/3ML) 0.083% IN NEBU: 3.0000 mL | INHALATION_SOLUTION | Freq: Four times a day (QID) | RESPIRATORY_TRACT | Status: DC | PRN

## 2018-09-24 MED ORDER — LIDOCAINE HCL (PF) 1 % IJ SOLN
INTRAMUSCULAR | Status: AC
  Filled 2018-09-24: qty 30

## 2018-09-24 MED ORDER — FENTANYL CITRATE (PF) 100 MCG/2ML IJ SOLN
INTRAMUSCULAR | Status: DC | PRN
  Administered 2018-09-24 (×4): 25 ug via INTRAVENOUS

## 2018-09-24 MED ORDER — VERAPAMIL HCL 2.5 MG/ML IV SOLN
INTRAVENOUS | Status: DC | PRN
  Administered 2018-09-24: 10 mL via INTRA_ARTERIAL

## 2018-09-24 MED ORDER — FENTANYL CITRATE (PF) 100 MCG/2ML IJ SOLN
25.0000 ug | INTRAMUSCULAR | Status: DC | PRN
  Administered 2018-09-24: 25 ug via INTRAVENOUS
  Filled 2018-09-24: qty 2

## 2018-09-24 MED ORDER — HEPARIN (PORCINE) IN NACL 1000-0.9 UT/500ML-% IV SOLN
INTRAVENOUS | Status: AC
  Filled 2018-09-24: qty 1000

## 2018-09-24 SURGICAL SUPPLY — 22 items
BALLN SAPPHIRE 2.5X20 (BALLOONS) ×2
BALLN SAPPHIRE ~~LOC~~ 3.25X18 (BALLOONS) ×2 IMPLANT
BALLOON SAPPHIRE 2.5X20 (BALLOONS) ×1 IMPLANT
CATH EXPO 5F MPA-1 (CATHETERS) ×2 IMPLANT
CATH INFINITI 5 FR IM (CATHETERS) ×2 IMPLANT
CATH INFINITI 5 FR JL3.5 (CATHETERS) ×2 IMPLANT
CATH INFINITI 5 FR RCB (CATHETERS) ×2 IMPLANT
CATH INFINITI 5FR MULTPACK ANG (CATHETERS) ×2 IMPLANT
CATH LAUNCHER 6FR JR4 (CATHETERS) ×2 IMPLANT
DEVICE RAD COMP TR BAND LRG (VASCULAR PRODUCTS) ×2 IMPLANT
ELECT DEFIB PAD ADLT CADENCE (PAD) ×2 IMPLANT
GLIDESHEATH SLEND SS 6F .021 (SHEATH) ×2 IMPLANT
GUIDEWIRE INQWIRE 1.5J.035X260 (WIRE) ×1 IMPLANT
INQWIRE 1.5J .035X260CM (WIRE) ×2
KIT ENCORE 26 ADVANTAGE (KITS) ×2 IMPLANT
KIT HEART LEFT (KITS) ×2 IMPLANT
KIT HEMO VALVE WATCHDOG (MISCELLANEOUS) ×2 IMPLANT
PACK CARDIAC CATHETERIZATION (CUSTOM PROCEDURE TRAY) ×2 IMPLANT
STENT SYNERGY DES 2.75X38 (Permanent Stent) ×2 IMPLANT
TRANSDUCER W/STOPCOCK (MISCELLANEOUS) ×2 IMPLANT
TUBING CIL FLEX 10 FLL-RA (TUBING) ×2 IMPLANT
WIRE ASAHI PROWATER 180CM (WIRE) ×2 IMPLANT

## 2018-09-24 NOTE — Interval H&P Note (Signed)
Cath Lab Visit (complete for each Cath Lab visit)  Clinical Evaluation Leading to the Procedure:   ACS: Yes.    Non-ACS:    Anginal Classification: CCS IV  Anti-ischemic medical therapy: Minimal Therapy (1 class of medications)  Non-Invasive Test Results: No non-invasive testing performed  Prior CABG: Previous CABG      History and Physical Interval Note:  09/24/2018 4:27 PM  Kayla Malone  has presented today for surgery, with the diagnosis of elevated trop  The various methods of treatment have been discussed with the patient and family. After consideration of risks, benefits and other options for treatment, the patient has consented to  Procedure(s): LEFT HEART CATH AND CORS/GRAFTS ANGIOGRAPHY (N/A) as a surgical intervention .  The patient's history has been reviewed, patient examined, no change in status, stable for surgery.  I have reviewed the patient's chart and labs.  Questions were answered to the patient's satisfaction.     Kayla Malone

## 2018-09-24 NOTE — Progress Notes (Addendum)
Progress Note  Patient Name: Kayla Malone Date of Encounter: 09/24/2018  Primary Cardiologist: [place], unable to locate records  Subjective   Pt continues to have intermittent, mild chest tightness this AM. On Hep gtt and NPO if cath necessitated.  Inpatient Medications    Scheduled Meds: . aspirin EC  81 mg Oral Daily  . atorvastatin  80 mg Oral Daily  . fluticasone  2 spray Each Nare Daily  . linagliptin  5 mg Oral Daily  . metoprolol succinate  50 mg Oral Daily  . mometasone-formoterol  2 puff Inhalation BID  . pantoprazole  40 mg Oral Daily  . traZODone  50 mg Oral QHS   Continuous Infusions: . heparin     PRN Meds: acetaminophen, albuterol, nitroGLYCERIN, ondansetron (ZOFRAN) IV   Vital Signs    Vitals:   09/24/18 0105 09/24/18 0205 09/24/18 0633  BP: (!) 149/82  103/67  Pulse: 62  (!) 59  Resp: 18  16  Temp: 98.4 F (36.9 C)  98.2 F (36.8 C)  TempSrc: Oral  Oral  SpO2: 100%  94%  Weight: 69 kg 67 kg   Height:  5\' 3"  (1.6 m)    No intake or output data in the 24 hours ending 09/24/18 0653 Filed Weights   09/24/18 0105 09/24/18 0205  Weight: 69 kg 67 kg   Physical Exam   General: Well developed, well nourished, NAD Skin: Warm, dry, intact  Head: Normocephalic, atraumatic, clear, moist mucus membranes. Neck: Negative for carotid bruits. No JVD Lungs:Clear to ausculation bilaterally. No wheezes, rales, or rhonchi. Breathing is unlabored. Cardiovascular: RRR with S1 S2. No murmurs, rubs, gallops, or LV heave appreciated. Abdomen: Soft, non-tender, non-distended with normoactive bowel sounds. No obvious abdominal masses. MSK: Strength and tone appear normal for age. 5/5 in all extremities Extremities: No edema. No clubbing or cyanosis. DP/PT pulses 2+ bilaterally Neuro: Alert and oriented. No focal deficits. No facial asymmetry. MAE spontaneously. Psych: Responds to questions appropriately with normal affect.    Labs     Chemistry Recent Labs  Lab 09/24/18 0218  NA 141  K 3.5  CL 107  CO2 25  GLUCOSE 116*  BUN 5*  CREATININE 0.81  CALCIUM 8.8*  PROT 6.3*  ALBUMIN 3.6  AST 16  ALT 13  ALKPHOS 59  BILITOT 0.8  GFRNONAA >60  GFRAA >60  ANIONGAP 9     Hematology Recent Labs  Lab 09/24/18 0218  WBC 6.7  RBC 4.86  HGB 12.5  HCT 40.1  MCV 82.5  MCH 25.7*  MCHC 31.2  RDW 13.4  PLT 288    Cardiac Enzymes Recent Labs  Lab 09/24/18 0218  TROPONINI 0.10*   No results for input(s): TROPIPOC in the last 168 hours.   BNP Recent Labs  Lab 09/24/18 0218  BNP 91.2     DDimer No results for input(s): DDIMER in the last 168 hours.   Radiology    No results found.  Telemetry    09/24/18 NS/SB - Personally Reviewed  ECG    09/23/18 SB with no acute ischemic changes - Personally Reviewed  Cardiac Studies   Cardiac cath 10/09/2017:  Ramus lesion, 45 %stenosed.  Prox LAD to Mid LAD lesion, 90 %stenosed.  Mid RCA lesion, 80 %stenosed.  Dist RCA lesion, 60 %stenosed.   LEFT MAIN AND PROXIMAL LAD AND MID RCA HIGH GRADE LESION WITH NORMAL LVEF. ADVISE CABG ASAP  Echocardiogram 10/20/2017: Study Conclusions  - Left ventricle: The cavity  size was normal. There was mild   concentric hypertrophy. Systolic function was normal. The   estimated ejection fraction was in the range of 60% to 65%. Wall   motion was normal; there were no regional wall motion   abnormalities. Doppler parameters are consistent with   pseudonormal left ventricular relaxation (grade 2 diastolic   dysfunction). The E/e&' ratio is >15, suggesting elevated LV   filling pressure. - Mitral valve: Mildly thickened leaflets . There was trivial   regurgitation. - Left atrium: The atrium was normal in size. - Inferior vena cava: The vessel was normal in size. The   respirophasic diameter changes were in the normal range (>= 50%),   consistent with normal central venous pressure.  Impressions:  - LVEF  60-65%, mild LVH, normal wall motion, grade 2 DD with   elevated LV filling pressure, trivial MR, normal IVC.  Echocardiogram with TEE 10/23/2017: Result status: Final result   Aortic valve: The valve is trileaflet. No stenosis. No regurgitation.  Right ventricle: Normal cavity size, wall thickness and ejection fraction.   Patient Profile     57 y.o. female w/ history of CVA, DM, HLD, HTN, CAD s/p CABG (2018) who presents w/ NSTEMI.   Assessment & Plan    1. NSTEMI with prior history of CAD s/p CABG 2018: -Patient presented with waxing and waning chest pain for the last several months however, while sitting at church yesterday morning the patient developed central chest pressure>>noted to be more intense and longer in duration than to previous episodes. Given that the tightness persisted, EMS was called and she proceeded to the ED for further evaluation -Troponin, 0.10>>>> continue to trend -EKG, SB with no acute ischemic changes similar to prior tracings -Currently NPO for possible cardiac catheterization -Continue IV heparin, Plavix on hold -Continue ASA 81, atorvastatin 80, Toprol XL 50 -Echocardiogram, pending  2. DM2: -Hemoglobin A1c, 7.3 on 10/23/2017 -SSI for glucose control while inpatient status -Metformin on hold for cath, resume 09/25/2018  3.  HTN: -Stable, 103/67 -Consider decreasing Toprol-XL 25 secondary to sinus bradycardia  4.  CVA: -No residual   5. Hypomagnesemia: -Will replace with 2g IV  -Follow with BMET in AM   Signed, Kathyrn Drown NP-C [place] Pager: [phone] 09/24/2018, 6:53 AM     Attending Note:   The patient was seen and examined.  Agree with assessment and plan as noted above.  Changes made to the above note as needed.  Patient seen and independently examined with Kathyrn Drown, NP .   We discussed all aspects of the encounter. I agree with the assessment and plan as stated above.  1.  Unstable angina: Patient presents with  episodes of chest tightness that are fairly similar to her previous episodes of angina last year prior to her bypass surgery.  Her troponin level is mildly elevated at 0.10.  It should be noted that the troponin level was normal in June.  I think that we should proceed with heart catheterization.  We have discussed the risks, benefits, options of heart cath.  She understands and agrees to proceed.   I have spent a total of 40 minutes with patient reviewing hospital  notes , telemetry, EKGs, labs and examining patient as well as establishing an assessment and plan that was discussed with the patient. > 50% of time was spent in direct patient care.  2.  Hypertensive heart disease without congestive heart failure: Blood pressure is currently stable.    Ramond Dial.,  MD, [facility] 09/24/2018, 8:56 AM [address],  [place] Pager [phone]

## 2018-09-24 NOTE — Progress Notes (Signed)
  Echocardiogram 2D Echocardiogram has been performed.  Darlina Sicilian M 09/24/2018, 9:08 AM

## 2018-09-24 NOTE — Progress Notes (Signed)
Nutrition Brief Note  Patient identified on the Malnutrition Screening Tool (MST) Report.   Weight loss has been minimal. 3.5% weight loss within one month is not significant for the time frame. Patient reports poor appetite for some foods, but has been tolerating fruits and vegetables well. Nutrition focused physical exam completed.  No muscle or subcutaneous fat depletion noticed.   Wt Readings from Last 15 Encounters:  09/24/18 67 kg  08/31/18 69.4 kg  05/20/18 71.2 kg  05/02/18 69.4 kg  12/28/17 67.6 kg  12/07/17 61.2 kg  10/28/17 69.8 kg  10/12/17 71.7 kg  10/09/17 68 kg  08/15/17 68 kg  05/24/17 72.6 kg  03/25/17 69.9 kg  02/18/17 72.6 kg  02/14/17 72.6 kg  11/05/16 75.8 kg    Body mass index is 26.16 kg/m. Patient meets criteria for overweight based on current BMI.   Current diet order is NPO for procedure. Labs and medications reviewed.   No nutrition interventions warranted at this time. If nutrition issues arise, please consult RD.   Molli Barrows, RD, LDN, [place] Pager [phone] After Hours Pager [phone]

## 2018-09-24 NOTE — Progress Notes (Signed)
Patient arrived from outside hospital complaining of 3/10 left sided chest tightness.  IV heparin infusing at 800units/hour.  Nitro paste noted to patient's chest.  Cardiology text paged with this information via Amion.

## 2018-09-24 NOTE — H&P (View-Only) (Signed)
Progress Note  Patient Name: Kayla Malone Date of Encounter: 09/24/2018  Primary Cardiologist: [place], unable to locate records  Subjective   Pt continues to have intermittent, mild chest tightness this AM. On Hep gtt and NPO if cath necessitated.  Inpatient Medications    Scheduled Meds: . aspirin EC  81 mg Oral Daily  . atorvastatin  80 mg Oral Daily  . fluticasone  2 spray Each Nare Daily  . linagliptin  5 mg Oral Daily  . metoprolol succinate  50 mg Oral Daily  . mometasone-formoterol  2 puff Inhalation BID  . pantoprazole  40 mg Oral Daily  . traZODone  50 mg Oral QHS   Continuous Infusions: . heparin     PRN Meds: acetaminophen, albuterol, nitroGLYCERIN, ondansetron (ZOFRAN) IV   Vital Signs    Vitals:   09/24/18 0105 09/24/18 0205 09/24/18 0633  BP: (!) 149/82  103/67  Pulse: 62  (!) 59  Resp: 18  16  Temp: 98.4 F (36.9 C)  98.2 F (36.8 C)  TempSrc: Oral  Oral  SpO2: 100%  94%  Weight: 69 kg 67 kg   Height:  5\' 3"  (1.6 m)    No intake or output data in the 24 hours ending 09/24/18 0653 Filed Weights   09/24/18 0105 09/24/18 0205  Weight: 69 kg 67 kg   Physical Exam   General: Well developed, well nourished, NAD Skin: Warm, dry, intact  Head: Normocephalic, atraumatic, clear, moist mucus membranes. Neck: Negative for carotid bruits. No JVD Lungs:Clear to ausculation bilaterally. No wheezes, rales, or rhonchi. Breathing is unlabored. Cardiovascular: RRR with S1 S2. No murmurs, rubs, gallops, or LV heave appreciated. Abdomen: Soft, non-tender, non-distended with normoactive bowel sounds. No obvious abdominal masses. MSK: Strength and tone appear normal for age. 5/5 in all extremities Extremities: No edema. No clubbing or cyanosis. DP/PT pulses 2+ bilaterally Neuro: Alert and oriented. No focal deficits. No facial asymmetry. MAE spontaneously. Psych: Responds to questions appropriately with normal affect.    Labs     Chemistry Recent Labs  Lab 09/24/18 0218  NA 141  K 3.5  CL 107  CO2 25  GLUCOSE 116*  BUN 5*  CREATININE 0.81  CALCIUM 8.8*  PROT 6.3*  ALBUMIN 3.6  AST 16  ALT 13  ALKPHOS 59  BILITOT 0.8  GFRNONAA >60  GFRAA >60  ANIONGAP 9     Hematology Recent Labs  Lab 09/24/18 0218  WBC 6.7  RBC 4.86  HGB 12.5  HCT 40.1  MCV 82.5  MCH 25.7*  MCHC 31.2  RDW 13.4  PLT 288    Cardiac Enzymes Recent Labs  Lab 09/24/18 0218  TROPONINI 0.10*   No results for input(s): TROPIPOC in the last 168 hours.   BNP Recent Labs  Lab 09/24/18 0218  BNP 91.2     DDimer No results for input(s): DDIMER in the last 168 hours.   Radiology    No results found.  Telemetry    09/24/18 NS/SB - Personally Reviewed  ECG    09/23/18 SB with no acute ischemic changes - Personally Reviewed  Cardiac Studies   Cardiac cath 10/09/2017:  Ramus lesion, 45 %stenosed.  Prox LAD to Mid LAD lesion, 90 %stenosed.  Mid RCA lesion, 80 %stenosed.  Dist RCA lesion, 60 %stenosed.   LEFT MAIN AND PROXIMAL LAD AND MID RCA HIGH GRADE LESION WITH NORMAL LVEF. ADVISE CABG ASAP  Echocardiogram 10/20/2017: Study Conclusions  - Left ventricle: The cavity  size was normal. There was mild   concentric hypertrophy. Systolic function was normal. The   estimated ejection fraction was in the range of 60% to 65%. Wall   motion was normal; there were no regional wall motion   abnormalities. Doppler parameters are consistent with   pseudonormal left ventricular relaxation (grade 2 diastolic   dysfunction). The E/e&' ratio is >15, suggesting elevated LV   filling pressure. - Mitral valve: Mildly thickened leaflets . There was trivial   regurgitation. - Left atrium: The atrium was normal in size. - Inferior vena cava: The vessel was normal in size. The   respirophasic diameter changes were in the normal range (>= 50%),   consistent with normal central venous pressure.  Impressions:  - LVEF  60-65%, mild LVH, normal wall motion, grade 2 DD with   elevated LV filling pressure, trivial MR, normal IVC.  Echocardiogram with TEE 10/23/2017: Result status: Final result   Aortic valve: The valve is trileaflet. No stenosis. No regurgitation.  Right ventricle: Normal cavity size, wall thickness and ejection fraction.   Patient Profile     57 y.o. female w/ history of CVA, DM, HLD, HTN, CAD s/p CABG (2018) who presents w/ NSTEMI.   Assessment & Plan    1. NSTEMI with prior history of CAD s/p CABG 2018: -Patient presented with waxing and waning chest pain for the last several months however, while sitting at church yesterday morning the patient developed central chest pressure>>noted to be more intense and longer in duration than to previous episodes. Given that the tightness persisted, EMS was called and she proceeded to the ED for further evaluation -Troponin, 0.10>>>> continue to trend -EKG, SB with no acute ischemic changes similar to prior tracings -Currently NPO for possible cardiac catheterization -Continue IV heparin, Plavix on hold -Continue ASA 81, atorvastatin 80, Toprol XL 50 -Echocardiogram, pending  2. DM2: -Hemoglobin A1c, 7.3 on 10/23/2017 -SSI for glucose control while inpatient status -Metformin on hold for cath, resume 09/25/2018  3.  HTN: -Stable, 103/67 -Consider decreasing Toprol-XL 25 secondary to sinus bradycardia  4.  CVA: -No residual   5. Hypomagnesemia: -Will replace with 2g IV  -Follow with BMET in AM   Signed, Kathyrn Drown NP-C [place] Pager: [phone] 09/24/2018, 6:53 AM     Attending Note:   The patient was seen and examined.  Agree with assessment and plan as noted above.  Changes made to the above note as needed.  Patient seen and independently examined with Kathyrn Drown, NP .   We discussed all aspects of the encounter. I agree with the assessment and plan as stated above.  1.  Unstable angina: Patient presents with  episodes of chest tightness that are fairly similar to her previous episodes of angina last year prior to her bypass surgery.  Her troponin level is mildly elevated at 0.10.  It should be noted that the troponin level was normal in June.  I think that we should proceed with heart catheterization.  We have discussed the risks, benefits, options of heart cath.  She understands and agrees to proceed.   I have spent a total of 40 minutes with patient reviewing hospital  notes , telemetry, EKGs, labs and examining patient as well as establishing an assessment and plan that was discussed with the patient. > 50% of time was spent in direct patient care.  2.  Hypertensive heart disease without congestive heart failure: Blood pressure is currently stable.    Ramond Dial.,  MD, [facility] 09/24/2018, 8:56 AM [address]  [place] Pager [phone]

## 2018-09-24 NOTE — Progress Notes (Signed)
ANTICOAGULATION CONSULT NOTE - Initial Consult  Pharmacy Consult for Heparin Indication: chest pain/ACS  Allergies  Allergen Reactions  . Lisinopril   . Tramadol Other (See Comments)    sleepiness  . Amoxicillin Rash  . Clavulanic Acid Rash    Patient Measurements: Height: 5\' 3"  (160 cm) Weight: 147 lb 11.2 oz (67 kg) IBW/kg (Calculated) : 52.4  Vital Signs: Temp: 98.4 F (36.9 C) (10/14 0105) Temp Source: Oral (10/14 0105) BP: 149/82 (10/14 0105) Pulse Rate: 62 (10/14 0105)  Labs: Recent Labs    09/24/18 0218  HGB 12.5  HCT 40.1  PLT 288  APTT 118*  LABPROT 13.9  INR 1.07  HEPARINUNFRC 0.60  CREATININE 0.81  TROPONINI 0.10*    Estimated Creatinine Clearance: 70.4 mL/min (by C-G formula based on SCr of 0.81 mg/dL).   Medical History: Past Medical History:  Diagnosis Date  . Asthma   . Coronary artery disease   . Depression   . Diabetes mellitus without complication ([place])   . GERD (gastroesophageal reflux disease)   . History of hiatal hernia   . Hypertension   . Pain    BACK  . Stroke ([place])    x3  . Thyroid cyst     Medications:  Medications Prior to Admission  Medication Sig Dispense Refill Last Dose  . acetaminophen (TYLENOL) 500 MG tablet Take 1,000 mg by mouth every 6 (six) hours as needed for mild pain or headache.   prn at prn  . albuterol (PROVENTIL HFA;VENTOLIN HFA) 108 (90 Base) MCG/ACT inhaler Inhale 2 puffs into the lungs every 6 (six) hours as needed for wheezing or shortness of breath.    prn at prn  . aspirin EC 81 MG tablet Take 81 mg by mouth daily.   05/20/2018 at Unknown time  . atorvastatin (LIPITOR) 40 MG tablet Take 80 mg by mouth daily.    05/20/2018 at Unknown time  . fluticasone (FLONASE) 50 MCG/ACT nasal spray Place 2 sprays into both nostrils daily.   05/20/2018 at Unknown time  . Fluticasone-Salmeterol (ADVAIR DISKUS) 500-50 MCG/DOSE AEPB Inhale 1 puff into the lungs daily as needed (SOB).    prn at prn  . metFORMIN  (GLUCOPHAGE) 500 MG tablet Take 1,000 mg by mouth every morning.   5 05/20/2018 at Unknown time  . metFORMIN (GLUCOPHAGE) 500 MG tablet Take 500 mg by mouth every evening.     . metoprolol succinate (TOPROL-XL) 50 MG 24 hr tablet Take 50 mg by mouth daily. Take with or immediately following a meal.     . pantoprazole (PROTONIX) 40 MG tablet Take 40 mg by mouth.    05/20/2018 at Unknown time  . sitaGLIPtin (JANUVIA) 50 MG tablet Take 50 mg by mouth daily.   05/20/2018 at Unknown time  . traZODone (DESYREL) 50 MG tablet Take 50 mg by mouth at bedtime.  5 05/20/2018 at Unknown time  . VOLTAREN 1 % GEL APPLY 2 GRAMS TOPICALLY 4 TIMES DAILY AS NEEDED  2 prn at prn    Assessment: 57 y.o. female with chest pain for heparin.  Heparin 4000 units IV bolus, 800 units/hr started at ~1830.  Initial heparin level within goal range  Goal of Therapy:  Heparin level 0.3-0.7 units/ml Monitor platelets by anticoagulation protocol: Yes   Plan:  Continue Heparin at current rate   Kayla Malone, [place] 09/24/2018,5:39 AM

## 2018-09-24 NOTE — Progress Notes (Signed)
CRITICAL VALUE ALERT  Critical Value:  Troponin 0.10  Date & Time Notied:  09/24/2018 about 0335  Provider Notified: Cardiology paged via Amion  Orders Received/Actions taken: none at this time

## 2018-09-25 ENCOUNTER — Encounter (HOSPITAL_COMMUNITY): Payer: Self-pay | Admitting: Interventional Cardiology

## 2018-09-25 LAB — BASIC METABOLIC PANEL
Anion gap: 8 (ref 5–15)
BUN: 7 mg/dL (ref 6–20)
CO2: 26 mmol/L (ref 22–32)
CREATININE: 0.82 mg/dL (ref 0.44–1.00)
Calcium: 8.8 mg/dL — ABNORMAL LOW (ref 8.9–10.3)
Chloride: 105 mmol/L (ref 98–111)
GFR calc non Af Amer: >60 mL/min (ref >60)
Glucose, Bld: 119 mg/dL — ABNORMAL HIGH (ref 70–99)
Potassium: 3.6 mmol/L (ref 3.5–5.1)
SODIUM: 139 mmol/L (ref 135–145)

## 2018-09-25 LAB — CBC
HCT: 36.6 % (ref 36.0–46.0)
Hemoglobin: 11.7 g/dL — ABNORMAL LOW (ref 12.0–15.0)
MCH: 26.1 pg (ref 26.0–34.0)
MCHC: 32 g/dL (ref 30.0–36.0)
MCV: 81.7 fL (ref 80.0–100.0)
Platelets: 254 10*3/uL (ref 150–400)
RBC: 4.48 MIL/uL (ref 3.87–5.11)
RDW: 13.6 % (ref 11.5–15.5)
WBC: 6.6 10*3/uL (ref 4.0–10.5)
nRBC: 0 % (ref 0.0–0.2)

## 2018-09-25 LAB — GLUCOSE, CAPILLARY
GLUCOSE-CAPILLARY: 92 mg/dL (ref 70–99)
GLUCOSE-CAPILLARY: 161 mg/dL — AB (ref 70–99)
GLUCOSE-CAPILLARY: 155 mg/dL — AB (ref 70–99)

## 2018-09-25 MED ORDER — NITROGLYCERIN 0.4 MG SL SUBL: 0.4000 mg | SUBLINGUAL_TABLET | SUBLINGUAL | 0 refills | Status: AC | PRN

## 2018-09-25 MED ORDER — TICAGRELOR 90 MG PO TABS: 90.0000 mg | ORAL_TABLET | Freq: Two times a day (BID) | ORAL | 0 refills | Status: DC

## 2018-09-25 MED FILL — Heparin Sodium (Porcine) 100 Unt/ML in Sodium Chloride 0.45%: INTRAMUSCULAR | Qty: 250 | Status: AC

## 2018-09-25 MED FILL — BRILINTA 90 MG TABLET: 90 | 30 days supply | Qty: 60 | Fill #0

## 2018-09-25 MED FILL — NITROGLYCERIN 0.4 MG TAB SL: 0.4 | 25 days supply | Qty: 25 | Fill #0

## 2018-09-25 NOTE — Discharge Summary (Addendum)
Discharge Summary    Patient ID: TALYIA ALLENDE MRN: [id number]; DOB: 02-16-1961  Admit date: 09/24/2018 Discharge date: 09/25/2018  Primary Care Provider: Dionisio [name], MD  Primary Cardiologist: Dionisio [name], MD   Discharge Diagnoses    Principal Problem:   NSTEMI (non-ST elevated myocardial infarction) [facility]) Active Problems:   Chest pain, unspecified   Diabetes ([place])   Hyperlipidemia, unspecified   HTN (hypertension)   CVA, old, hemiparesis ([place])   Coronary artery disease   Hx of CABG  Allergies Allergies  Allergen Reactions  . Lisinopril Swelling  . Tramadol Other (See Comments)    sleepiness  . Amoxicillin Rash    Don't remember   . Clavulanic Acid Rash   Diagnostic Studies/Procedures    Cardiac catheterization 09/24/2018:  Ramus lesion is 45% stenosed.  Ost LAD to Prox LAD lesion is 90% stenosed. LIMA to LAD is patent.  2nd Mrg lesion is 25% stenosed.  Mid LM to Dist LM lesion is 40% stenosed.  Mid RCA to Dist RCA lesion is 80% stenosed. SVG is atretic.  A drug-eluting stent was successfully placed using a STENT SYNERGY DES [id number].  Post intervention, there is a 0% residual stenosis.  LV end diastolic pressure is normal.  There is no aortic valve stenosis.  Recommend uninterrupted dual antiplatelet therapy with Aspirin 81mg  daily and Ticagrelor 90mg  twice dailyfor a minimum of 12 months (ACS - Class I recommendation).  Continue aggressive secondary prevention.   Echocardiogram 09/24/18:  Study Conclusions  - Left ventricle: The cavity size was normal. There was mild   concentric hypertrophy. Systolic function was vigorous. The   estimated ejection fraction was in the range of 65% to 70%. Wall   motion was normal; there were no regional wall motion   abnormalities. Features are consistent with a pseudonormal left   ventricular filling pattern, with concomitant abnormal relaxation   and increased filling pressure (grade 2  diastolic dysfunction).   Doppler parameters are consistent with indeterminate ventricular   filling pressure. - Aortic valve: Transvalvular velocity was within the normal range.   There was no stenosis. There was no regurgitation. - Mitral valve: Transvalvular velocity was within the normal range.   There was no evidence for stenosis. There was no regurgitation. - Right ventricle: The cavity size was normal. Wall thickness was   normal. Systolic function was normal. - Atrial septum: No defect or patent foramen ovale was identified   by color flow Doppler. - Tricuspid valve: There was trivial regurgitation. - Pulmonary arteries: Systolic pressure was within the normal   range. PA peak pressure: 21 mm Hg (S). - Pericardium, extracardiac: A trivial pericardial effusion was   identified. - Global longitudinal strain -18.0% (normal).  History of Present Illness     57 y.o. female w/ history of CVA, DM, HLD, HTN, CAD s/p CABG (2018) who presents w/ NSTEMI>> found to have mid to distal RCA 80% lesion with subsequent PCI/DES placement 09/24/2018.  In brief, patient described waxing and waning chest discomfort for the past several months.  She has been followed closely by her outpatient doctors and has had several changes in her medications. She reported however recently being actively going to cardiac rehab and is been taking all of her medications as prescribed.  On Sunday, while at church, the patient developed central chest pressure that was worse than she had experienced over the past several months.  he pressure persisted, therefore EMS was called and she was brought initially to an outside hospital  ED, then transferred to [facility] given elevated troponin levels.  She was started on a heparin drip and treated with a full dose aspirin and Nitropaste. Her chest pain resolved and she was transferred for further evaluation.  On arrival at [facility], she remained chest pain free, however given her  recent CABG and elevated troponin with symptoms consistent with ACS, plan was for cardiac catheterization for more definitive evaluation.   Hospital Course   On 09/24/18 the patient was taken to the cath lab which reveled a mid-distal 80% RCA lesion in which a PCI/DES was placed with recommendations for uninterrupted DAPT with ASA and Brilinta for at least 12 months and continued aggressive secondary prevention measures. Echocardiogram completed 09/24/18 with LVEF of 60-65% with NWMA however G2DD. She has had no evidence of fluid volume overload during this hospital admission. Post hospital creatinine 0.82 and Hb stable at 11.7.   Left radial cath site mildly swollen with signs of ecchymosis, level 1. She has been instructed to not use this wrist and monitor for further swelling, pain, redness, or bleeding. She will need to call her primary cardiologist, Dr. Humphrey Rolls in [place], [place] for close follow up within 1-2 weeks. She has been seen by Cardiac rehab and ambulated without complication.   Medication changes: Brilinta 90mg  PO twice daily, ASA 81mg  PO daily   Other hospital problems include:  -DM2: -Hemoglobin A1c, 7.3 on 10/23/2017 -Will restart Metformin on 09/27/18  -HTN: -Improved, 117/69, 137/67 -Continue Toprol-XL 50  Consultants: None    The patient was seen and examined by Dr. Acie Fredrickson who feels that she is stable and ready for discharge today, 09/25/18.  _____________  Discharge Vitals Blood pressure 117/69, pulse (!) 57, temperature (!) 97.5 F (36.4 C), temperature source Oral, resp. rate 16, height 5\' 3"  (1.6 m), weight 69.2 kg, SpO2 100 %.  Filed Weights   09/24/18 0105 09/24/18 0205 09/25/18 0528  Weight: 69 kg 67 kg 69.2 kg   Labs & Radiologic Studies    CBC Recent Labs    09/24/18 0218 09/25/18 0441  WBC 6.7 6.6  NEUTROABS 3.1  --   HGB 12.5 11.7*  HCT 40.1 36.6  MCV 82.5 81.7  PLT 288 937   Basic Metabolic Panel Recent Labs    09/24/18 0218  09/25/18 0441  NA 141 139  K 3.5 3.6  CL 107 105  CO2 25 26  GLUCOSE 116* 119*  BUN 5* 7  CREATININE 0.81 0.82  CALCIUM 8.8* 8.8*  MG 1.3*  --    Liver Function Tests Recent Labs    09/24/18 0218  AST 16  ALT 13  ALKPHOS 59  BILITOT 0.8  PROT 6.3*  ALBUMIN 3.6   No results for input(s): LIPASE, AMYLASE in the last 72 hours. Cardiac Enzymes Recent Labs    09/24/18 0218 09/24/18 0822 09/24/18 1407  TROPONINI 0.10* 0.09* 0.07*   Thyroid Function Tests Recent Labs    09/24/18 0218  TSH 1.923   _____________  Dg Chest 2 View  Result Date: 08/31/2018 CLINICAL DATA:  Body aches fever and chills, nausea and fatigue EXAM: CHEST - 2 VIEW COMPARISON:  05/20/2018 FINDINGS: Post sternotomy changes. No focal opacity or pleural effusion. Normal cardiomediastinal silhouette. No pneumothorax. IMPRESSION: No active cardiopulmonary disease. Electronically Signed   By: Donavan Foil M.D.   On: 08/31/2018 19:20   Disposition   Pt is being discharged home today in good condition.  Follow-up Plans & Appointments   Follow-up Information  Dionisio [name], MD Follow up.   Specialty:  Cardiology Why:  Please call his office to schedule a close follow up appointment within the next 1-2 weeks  Contact information: [place] [id number] [phone]          Discharge Instructions    Amb Referral to Cardiac Rehabilitation   Complete by:  As directed    Diagnosis:   Coronary Stents NSTEMI     Call MD for:  difficulty breathing, headache or visual disturbances   Complete by:  As directed    Call MD for:  extreme fatigue   Complete by:  As directed    Call MD for:  hives   Complete by:  As directed    Call MD for:  persistant dizziness or light-headedness   Complete by:  As directed    Call MD for:  persistant nausea and vomiting   Complete by:  As directed    Call MD for:  redness, tenderness, or signs of infection (pain, swelling, redness, odor or  green/yellow discharge around incision site)   Complete by:  As directed    Call MD for:  severe uncontrolled pain   Complete by:  As directed    Call MD for:  temperature >100.4   Complete by:  As directed    Diet - low sodium heart healthy   Complete by:  As directed    Discharge instructions   Complete by:  As directed    You will need to hold your Metformin until 09/27/18.   No driving for 3 days. No lifting over 5 lbs for 1 week. No sexual activity for 1 week. Keep procedure site clean & dry. If you notice increased pain, swelling, bleeding or pus, call/return!  You may shower, but no soaking baths/hot tubs/pools for 1 week.   PLEASE DO NOT MISS ANY DOSES OF YOUR BRILINTA!!!!! Also keep a log of you blood pressures and bring back to your follow up appt. Please call the office with any questions.   Patients taking blood thinners should generally stay away from medicines like ibuprofen, Advil, Motrin, naproxen, and Aleve due to risk of stomach bleeding. You may take Tylenol as directed or talk to your primary doctor about alternatives.  Some studies suggest Prilosec/Omeprazole interacts with Plavix. Continue your dose of Protonix for less chance of interaction.   Increase activity slowly   Complete by:  As directed      Discharge Medications   Allergies as of 09/25/2018      Reactions   Lisinopril Swelling   Tramadol Other (See Comments)   sleepiness   Amoxicillin Rash   Don't remember    Clavulanic Acid Rash      Medication List    TAKE these medications   acetaminophen 500 MG tablet Commonly known as:  TYLENOL Take 1,000 mg by mouth every 6 (six) hours as needed for mild pain or headache.   ADVAIR DISKUS 500-50 MCG/DOSE Aepb Generic drug:  Fluticasone-Salmeterol Inhale 1 puff into the lungs daily as needed (SOB).   albuterol 108 (90 Base) MCG/ACT inhaler Commonly known as:  PROVENTIL HFA;VENTOLIN HFA Inhale 2 puffs into the lungs every 6 (six) hours as needed for  wheezing or shortness of breath.   albuterol 0.63 MG/3ML nebulizer solution Commonly known as:  ACCUNEB Take 1 ampule by nebulization every 6 (six) hours as needed for wheezing.   aspirin EC 81 MG tablet Take 81 mg by mouth daily.   atorvastatin  40 MG tablet Commonly known as:  LIPITOR Take 80 mg by mouth daily.   fluticasone 50 MCG/ACT nasal spray Commonly known as:  FLONASE Place 2 sprays into both nostrils daily.   losartan 50 MG tablet Commonly known as:  COZAAR Take 50 mg by mouth daily.   metFORMIN 500 MG tablet Commonly known as:  GLUCOPHAGE Take 500 mg by mouth every evening.   metFORMIN 500 MG tablet Commonly known as:  GLUCOPHAGE Take 1,000 mg by mouth every morning.   metoprolol succinate 50 MG 24 hr tablet Commonly known as:  TOPROL-XL Take 50 mg by mouth daily. Take with or immediately following a meal.   nitroGLYCERIN 0.4 MG SL tablet Commonly known as:  NITROSTAT Place 1 tablet (0.4 mg total) under the tongue every 5 (five) minutes x 3 doses as needed for chest pain.   pantoprazole 40 MG tablet Commonly known as:  PROTONIX Take 40 mg by mouth.   sitaGLIPtin 50 MG tablet Commonly known as:  JANUVIA Take 50 mg by mouth daily.   ticagrelor 90 MG Tabs tablet Commonly known as:  BRILINTA Take 1 tablet (90 mg total) by mouth 2 (two) times daily.   traZODone 50 MG tablet Commonly known as:  DESYREL Take 50 mg by mouth at bedtime.   VOLTAREN 1 % Gel Generic drug:  diclofenac sodium Apply 2 g topically as needed (pain in shoulder).        Acute coronary syndrome (MI, NSTEMI, STEMI, etc) this admission?: Yes.     AHA/ACC Clinical Performance & Quality Measures: 1. Aspirin prescribed? - Yes 2. ADP Receptor Inhibitor (Plavix/Clopidogrel, Brilinta/Ticagrelor or Effient/Prasugrel) prescribed (includes medically managed patients)? - Yes 3. Beta Blocker prescribed? - Yes 4. High Intensity Statin (Lipitor 40-80mg  or Crestor 20-40mg ) prescribed? -  Yes 5. EF assessed during THIS hospitalization? - Yes 6. For EF <40%, was ACEI/ARB prescribed? - Not Applicable (EF >/= 34%) 7. For EF <40%, Aldosterone Antagonist (Spironolactone or Eplerenone) prescribed? - Not Applicable (EF >/= 74%) 8. Cardiac Rehab Phase II ordered (Included Medically managed Patients)? - Yes   Outstanding Labs/Studies   Needs left cath site re-assessment   Duration of Discharge Encounter   Greater than 30 minutes including physician time.  Signed, Kathyrn Drown, NP 09/25/2018, 11:27 AM   Attending Note:   The patient was seen and examined.  Agree with assessment and plan as noted above.  Changes made to the above note as needed.  Patient seen and independently examined with  Kathyrn Drown, NP .   We discussed all aspects of the encounter. I agree with the assessment and plan as stated above.  1.   CAD:   She is s/p stenting of her RCA.   Follow up [place] Dr. Chancy [place]  Doing well.       I have spent a total of 40 minutes with patient reviewing hospital  notes , telemetry, EKGs, labs and examining patient as well as establishing an assessment and plan that was discussed with the patient. > 50% of time was spent in direct patient care.    Thayer Headings, Brooke Bonito., MD, [facility] 09/26/2018, 12:32 PM [address]  [place] Pager [phone]

## 2018-09-25 NOTE — Progress Notes (Addendum)
Progress Note  Patient Name: Kayla Malone Date of Encounter: 09/25/2018  Primary Cardiologist: [name], [place]   Subjective   Pt feeling well this AM. Left wrist with pain and swelling, level 1. Forearm with mild swelling today and tender to touch. Otherwise no complaints of chest pain. Eating breakfast   Inpatient Medications    Scheduled Meds: . aspirin EC  81 mg Oral Daily  . atorvastatin  80 mg Oral Daily  . fluticasone  2 spray Each Nare Daily  . linagliptin  5 mg Oral Daily  . metoprolol succinate  50 mg Oral Daily  . mometasone-formoterol  2 puff Inhalation BID  . pantoprazole  40 mg Oral Daily  . sodium chloride flush  3 mL Intravenous Q12H  . ticagrelor  90 mg Oral BID  . traZODone  50 mg Oral QHS   Continuous Infusions: . sodium chloride     PRN Meds: sodium chloride, acetaminophen, albuterol, fentaNYL (SUBLIMAZE) injection, nitroGLYCERIN, ondansetron (ZOFRAN) IV, sodium chloride flush   Vital Signs    Vitals:   09/24/18 2006 09/24/18 2121 09/25/18 0432 09/25/18 0528  BP:  137/67 117/69   Pulse:  68 (!) 57   Resp:  17 16   Temp:  97.6 F (36.4 C) (!) 97.5 F (36.4 C)   TempSrc:  Oral Oral   SpO2: 100% 100% 99%   Weight:    69.2 kg  Height:        Intake/Output Summary (Last 24 hours) at 09/25/2018 0744 Last data filed at 09/24/2018 2300 Gross per 24 hour  Intake 400 ml  Output 250 ml  Net 150 ml   Filed Weights   09/24/18 0105 09/24/18 0205 09/25/18 0528  Weight: 69 kg 67 kg 69.2 kg   Physical Exam   General: Well developed, well nourished, NAD Skin: Warm, dry, intact  Head: Normocephalic, atraumatic, clear, moist mucus membranes. Neck: Negative for carotid bruits. No JVD Lungs:Clear to ausculation bilaterally. No wheezes, rales, or rhonchi. Breathing is unlabored. Cardiovascular: RRR with S1 S2. No murmurs, rubs, gallops, or LV heave appreciated. Abdomen: Soft, non-tender, non-distended with normoactive bowel sounds. No  obvious abdominal masses. MSK: Strength and tone appear normal for age. 5/5 in all extremities Extremities: No edema. No clubbing or cyanosis. DP/PT pulses 2+ bilaterally. Left wrist, level with increased pain and mild swelling  Neuro: Alert and oriented. No focal deficits. No facial asymmetry. MAE spontaneously. Psych: Responds to questions appropriately with normal affect.    Labs    Chemistry Recent Labs  Lab 09/24/18 0218 09/25/18 0441  NA 141 139  K 3.5 3.6  CL 107 105  CO2 25 26  GLUCOSE 116* 119*  BUN 5* 7  CREATININE 0.81 0.82  CALCIUM 8.8* 8.8*  PROT 6.3*  --   ALBUMIN 3.6  --   AST 16  --   ALT 13  --   ALKPHOS 59  --   BILITOT 0.8  --   GFRNONAA >60 >60  GFRAA >60 >60  ANIONGAP 9 8     Hematology Recent Labs  Lab 09/24/18 0218 09/25/18 0441  WBC 6.7 6.6  RBC 4.86 4.48  HGB 12.5 11.7*  HCT 40.1 36.6  MCV 82.5 81.7  MCH 25.7* 26.1  MCHC 31.2 32.0  RDW 13.4 13.6  PLT 288 254    Cardiac Enzymes Recent Labs  Lab 09/24/18 0218 09/24/18 0822 09/24/18 1407  TROPONINI 0.10* 0.09* 0.07*   No results for input(s): TROPIPOC in the last  168 hours.   BNP Recent Labs  Lab 09/24/18 0218  BNP 91.2     DDimer No results for input(s): DDIMER in the last 168 hours.   Radiology    No results found.  Telemetry    09/25/18 NSR - Personally Reviewed  ECG    No new tracings as of 09/25/2018- Personally Reviewed  Cardiac Studies   Cardiac catheterization 09/24/2018:  Ramus lesion is 45% stenosed.  Ost LAD to Prox LAD lesion is 90% stenosed. LIMA to LAD is patent.  2nd Mrg lesion is 25% stenosed.  Mid LM to Dist LM lesion is 40% stenosed.  Mid RCA to Dist RCA lesion is 80% stenosed. SVG is atretic.  A drug-eluting stent was successfully placed using a STENT SYNERGY DES [id number].  Post intervention, there is a 0% residual stenosis.  LV end diastolic pressure is normal.  There is no aortic valve stenosis.   Recommend uninterrupted dual  antiplatelet therapy with Aspirin 81mg  daily and Ticagrelor 90mg  twice daily for a minimum of 12 months (ACS - Class I recommendation).   Continue aggressive secondary prevention.   Cardiac cath 10/09/2017:  Ramus lesion, 45 %stenosed.  Prox LAD to Mid LAD lesion, 90 %stenosed.  Mid RCA lesion, 80 %stenosed.  Dist RCA lesion, 60 %stenosed.  LEFT MAIN AND PROXIMAL LAD AND MID RCA HIGH GRADE LESION WITH NORMAL LVEF. ADVISE CABG ASAP  Echocardiogram 10/20/2017: Study Conclusions  - Left ventricle: The cavity size was normal. There was mild concentric hypertrophy. Systolic function was normal. The estimated ejection fraction was in the range of 60% to 65%. Wall motion was normal; there were no regional wall motion abnormalities. Doppler parameters are consistent with pseudonormal left ventricular relaxation (grade 2 diastolic dysfunction). The E/e&' ratio is >15, suggesting elevated LV filling pressure. - Mitral valve: Mildly thickened leaflets . There was trivial regurgitation. - Left atrium: The atrium was normal in size. - Inferior vena cava: The vessel was normal in size. The respirophasic diameter changes were in the normal range (>= 50%), consistent with normal central venous pressure.  Impressions:  - LVEF 60-65%, mild LVH, normal wall motion, grade 2 DD with elevated LV filling pressure, trivial MR, normal IVC.  Echocardiogram with TEE 10/23/2017: Result status: Final result   Aortic valve: The valve is trileaflet. No stenosis. No regurgitation.  Right ventricle: Normal cavity size, wall thickness and ejection fraction.   Patient Profile     57 y.o. female w/ history of CVA, DM, HLD, HTN, CAD s/p CABG (2018) who presents w/ NSTEMI>> found to have mid to distal RCA 80% lesion with subsequent PCI/DES placement 09/24/2018  Assessment & Plan    1. NSTEMI with prior history of CAD s/p CABG 2018: -Patient presented with waxing and waning  chest pain for the last several months however, while sitting at church yesterday morning the patient developed central chest pressure>>noted to be more intense and longer in duration than to previous episodes. Given that the tightness persisted, EMS was called and she proceeded to the ED for further evaluation -Troponin, 0.10>0.09>0.07 -Cardiac cath revealed mid to distal RCA lesion at 80% with subsequent PCI/DES stent placement 09/24/2018 with recommendations for uninterrupted DAPT with ASA and Brilinta for at least 12 months and aggressive secondary prevention -Cath site with mild swelling and significant tenderness>>will consider ultrasound if continues to cause significant discomfort>>> post cath creatinine stable at 0.82 and hemoglobin 11.7 -Continue ASA 81, Brilinta, atorvastatin 80, Toprol XL 50  2. DM2: -Hemoglobin A1c, 7.3 on  10/23/2017 -SSI for glucose control while inpatient status  3.  HTN: -Improved, 117/69, 137/67 -Continue Toprol-XL 50  4.  CVA: -No residual   Signed, Kathyrn Drown NP-C [facility] Pager: [phone] 09/25/2018, 7:44 AM     For questions or updates, please contact   Please consult [email or web address] for contact info under Cardiology/STEMI.  Attending Note:   The patient was seen and examined.  Agree with assessment and plan as noted above.  Changes made to the above note as needed.  Patient seen and independently examined with  Kathyrn Drown, NP .   We discussed all aspects of the encounter. I agree with the assessment and plan as stated above.  1.  Coronary artery disease: The patient presented with waxing and waning chest pains.  Troponin levels were mildly elevated.  Cath yesterday revealed a distal RCA lesion of 80%.  She is status post PCI/DES placement.  She is on aspirin and Brilinta for at least 12 months.  She has moderate amount of tenderness and mild swelling of her cath site. The pulse is 2 + .   I think her arm will heal without  complications  OK for Dc today  Follow up with Dr. Chancy Milroy in [place]    I have spent a total of 40 minutes with patient reviewing hospital  notes , telemetry, EKGs, labs and examining patient as well as establishing an assessment and plan that was discussed with the patient. > 50% of time was spent in direct patient care.    Thayer Headings, Brooke Bonito., MD, [facility] 09/25/2018, 10:19 AM [address]  [place] Pager [phone]

## 2018-09-25 NOTE — Care Management Note (Signed)
Case Management Note  Patient Details  Name: Kayla Malone MRN: [id number] Date of Birth: 1961-11-22  Subjective/Objective:   Pt presented for Nstemi- Post cath- plan for Brilinta. 30 day free card provided to patient. Pt has PCP Dionisio [name], MD in [place] and patient gets her medications without any problems at [facility]. Pt is agreeable to medications via [facility]- Please send any new Rx's to [facility].                 Action/Plan: No home needs identified at this time.   Expected Discharge Date:                  Expected Discharge Plan:  Home/Self Care  In-House Referral:  NA  Discharge planning Services  CM Consult, Medication Assistance  Post Acute Care Choice:  NA Choice offered to:  NA  DME Arranged:  N/A DME Agency:  NA  HH Arranged:  NA HH Agency:  NA  Status of Service:  Completed, signed off  If discussed at [place] of Stay Meetings, dates discussed:    Additional Comments:  Bethena Roys, RN 09/25/2018, 10:56 AM

## 2018-09-25 NOTE — Progress Notes (Signed)
TR BAND REMOVAL  LOCATION:    left radial  DEFLATED PER PROTOCOL:    Yes.    TIME BAND OFF / DRESSING APPLIED:    23:45   SITE UPON ARRIVAL:    Level 1  SITE AFTER BAND REMOVAL:    Level 1  CIRCULATION SENSATION AND MOVEMENT:    Within Normal Limits   Yes.    COMMENTS:   Post TR band instructions given. Pt tolerated well.

## 2018-09-25 NOTE — Progress Notes (Signed)
CARDIAC REHAB PHASE I   PRE:  Rate/Rhythm: 34 SR  BP:  Sitting: 150/70      SaO2: 100 RA  MODE:  Ambulation: 120 ft   POST:  Rate/Rhythm: 71 SR  BP:  Sitting: 134/71    SaO2: 100 RA  Pt ambulated 143ft in hallway handheld assist. Pt denies CP, doe have some SOB. Pt c/o L arm tenderness and swelling. MI book and stent card at bedside. Pt educated on importance of ASA, Brilinta, and NTG. Pt given heart healthy and diabetic diets. Reviewed restrictions and exercise guidelines. Will refer to CRP II [place].   [id number] Rufina Falco, RN BSN 09/25/2018 9:35 AM

## 2018-10-11 ENCOUNTER — Encounter: Payer: Medicare Other | Attending: Cardiovascular Disease | Admitting: *Deleted

## 2018-10-11 VITALS — Ht 64.25 in | Wt 146.9 lb

## 2018-10-11 DIAGNOSIS — Z955 Presence of coronary angioplasty implant and graft: Secondary | ICD-10-CM

## 2018-10-11 DIAGNOSIS — I214 Non-ST elevation (NSTEMI) myocardial infarction: Secondary | ICD-10-CM | POA: Insufficient documentation

## 2018-10-11 NOTE — Progress Notes (Signed)
Cardiac Individual Treatment Plan  Patient Details  Name: Kayla Malone MRN: [id number] Date of Birth: 1961/09/04 Referring Provider:     Cardiac Rehab from 10/11/2018 in [facility] Cardiac and Pulmonary Rehab  Referring Provider  Humphrey Rolls      Initial Encounter Date:    Cardiac Rehab from 10/11/2018 in [facility] Cardiac and Pulmonary Rehab  Date  10/11/18      Visit Diagnosis: NSTEMI (non-ST elevated myocardial infarction) [facility])  Status post coronary artery stent placement  Patient's Home Medications on Admission:  Current Outpatient Medications:  .  acetaminophen (TYLENOL) 500 MG tablet, Take 1,000 mg by mouth every 6 (six) hours as needed for mild pain or headache., Disp: , Rfl:  .  albuterol (ACCUNEB) 0.63 MG/3ML nebulizer solution, Take 1 ampule by nebulization every 6 (six) hours as needed for wheezing., Disp: , Rfl:  .  albuterol (PROVENTIL HFA;VENTOLIN HFA) 108 (90 Base) MCG/ACT inhaler, Inhale 2 puffs into the lungs every 6 (six) hours as needed for wheezing or shortness of breath. , Disp: , Rfl:  .  aspirin EC 81 MG tablet, Take 81 mg by mouth daily., Disp: , Rfl:  .  atorvastatin (LIPITOR) 40 MG tablet, Take 80 mg by mouth daily. , Disp: , Rfl:  .  fluticasone (FLONASE) 50 MCG/ACT nasal spray, Place 2 sprays into both nostrils daily., Disp: , Rfl:  .  Fluticasone-Salmeterol (ADVAIR DISKUS) 500-50 MCG/DOSE AEPB, Inhale 1 puff into the lungs daily as needed (SOB). , Disp: , Rfl:  .  losartan (COZAAR) 50 MG tablet, Take 50 mg by mouth daily., Disp: , Rfl: 2 .  metFORMIN (GLUCOPHAGE) 500 MG tablet, Take 1,000 mg by mouth every morning. , Disp: , Rfl: 5 .  metFORMIN (GLUCOPHAGE) 500 MG tablet, Take 500 mg by mouth every evening., Disp: , Rfl:  .  metoprolol succinate (TOPROL-XL) 50 MG 24 hr tablet, Take 50 mg by mouth daily. Take with or immediately following a meal., Disp: , Rfl:  .  nitroGLYCERIN (NITROSTAT) 0.4 MG SL tablet, Place 1 tablet (0.4 mg total) under the tongue every 5  (five) minutes x 3 doses as needed for chest pain., Disp: 25 tablet, Rfl: 0 .  pantoprazole (PROTONIX) 40 MG tablet, Take 40 mg by mouth. , Disp: , Rfl:  .  sitaGLIPtin (JANUVIA) 50 MG tablet, Take 50 mg by mouth daily., Disp: , Rfl:  .  ticagrelor (BRILINTA) 90 MG TABS tablet, Take 1 tablet (90 mg total) by mouth 2 (two) times daily., Disp: 30 tablet, Rfl: 0 .  traZODone (DESYREL) 50 MG tablet, Take 50 mg by mouth at bedtime., Disp: , Rfl: 5 .  VOLTAREN 1 % GEL, Apply 2 g topically as needed (pain in shoulder). , Disp: , Rfl: 2  Past Medical History: Past Medical History:  Diagnosis Date  . Asthma   . Coronary artery disease   . Depression   . Diabetes mellitus without complication ([place])   . GERD (gastroesophageal reflux disease)   . History of hiatal hernia   . Hypertension   . Pain    BACK  . Stroke ([place])    x3  . Thyroid cyst     Tobacco Use: Social History   Tobacco Use  Smoking Status Former Smoker  . Last attempt to quit: 09/13/1998  . Years since quitting: 20.0  Smokeless Tobacco Never Used    Labs: Recent Review Scientist, physiological    Labs for ITP Cardiac and Pulmonary Rehab Latest Ref Rng & Units 10/23/2017 10/23/2017  10/23/2017 10/23/2017 10/24/2017   Hemoglobin A1c 4.8 - 5.6 % 7.3(H) - - - -   PHART 7.350 - 7.450 - 7.359 7.402 - -   PCO2ART 32.0 - 48.0 mmHg - 44.0 41.4 - -   HCO3 20.0 - 28.0 mmol/L - 25.1 26.0 - -   TCO2 22 - 32 mmol/L - _0 ACIDBASEDEF 0.0 - 2.0 mmol/L - 1.0 - - -   O2SAT % - 99.0 99.0 - -       Exercise Target Goals: Exercise Program Goal: Individual exercise prescription set using results from initial 6 min walk test and THRR while considering  patient's activity barriers and safety.   Exercise Prescription Goal: Initial exercise prescription builds to 30-45 minutes a day of aerobic activity, 2-3 days per week.  Home exercise guidelines will be given to patient during program as part of exercise prescription that the participant  will acknowledge.  Activity Barriers & Risk Stratification:   6 Minute Walk: 6 Minute Walk    Row Name 10/11/18 1351         6 Minute Walk   Phase  Initial     Distance  700 feet     Walk Time  5.75 minutes     # of Rest Breaks  1     MPH  1.32     METS  2.72     RPE  12     Perceived Dyspnea   1     VO2 Peak  9.5     Symptoms  Yes (comment)     Comments  foot drag when she fatigues     Resting HR  72 bpm     Resting BP  148/82     Resting Oxygen Saturation   97 %     Exercise Oxygen Saturation  during 6 min walk  98 %     Max Ex. HR  95 bpm     Max Ex. BP  150/78     2 Minute Post BP  130/78        Oxygen Initial Assessment:   Oxygen Re-Evaluation:   Oxygen Discharge (Final Oxygen Re-Evaluation):   Initial Exercise Prescription: Initial Exercise Prescription - 10/11/18 1300      Date of Initial Exercise RX and Referring Provider   Date  10/11/18    Referring Provider  Humphrey Rolls      NuStep   Level  1    SPM  80    Minutes  15    METs  2.7      Biostep-RELP   Level  2    SPM  50    Minutes  15    METs  2      Track   Laps  25    Minutes  15    METs  2.15      Prescription Details   Frequency (times per week)  3    Duration  Progress to 45 minutes of aerobic exercise without signs/symptoms of physical distress      Intensity   THRR 40-80% of Max Heartrate  108-144    Ratings of Perceived Exertion  11-13    Perceived Dyspnea  0-4      Resistance Training   Training Prescription  Yes    Weight  3 lb    Reps  10-15       Perform Capillary Blood Glucose checks as needed.  Exercise Prescription Changes: Exercise Prescription  Changes    Row Name 10/11/18 1400             Response to Exercise   Blood Pressure (Admit)  148/82       Blood Pressure (Exercise)  150/78       Blood Pressure (Exit)  130/78       Heart Rate (Admit)  72 bpm       Heart Rate (Exercise)  95 bpm       Heart Rate (Exit)  63 bpm       Rating of Perceived  Exertion (Exercise)  12       Perceived Dyspnea (Exercise)  1       Symptoms  foot drag          Exercise Comments:   Exercise Goals and Review: Exercise Goals    Row Name 10/11/18 1350             Exercise Goals   Increase Physical Activity  Yes       Intervention  Provide advice, education, support and counseling about physical activity/exercise needs.;Develop an individualized exercise prescription for aerobic and resistive training based on initial evaluation findings, risk stratification, comorbidities and participant's personal goals.       Expected Outcomes  Short Term: Attend rehab on a regular basis to increase amount of physical activity.;Long Term: Add in home exercise to make exercise part of routine and to increase amount of physical activity.;Long Term: Exercising regularly at least 3-5 days a week.       Increase Strength and Stamina  Yes       Intervention  Provide advice, education, support and counseling about physical activity/exercise needs.;Develop an individualized exercise prescription for aerobic and resistive training based on initial evaluation findings, risk stratification, comorbidities and participant's personal goals.       Expected Outcomes  Short Term: Increase workloads from initial exercise prescription for resistance, speed, and METs.;Short Term: Perform resistance training exercises routinely during rehab and add in resistance training at home;Long Term: Improve cardiorespiratory fitness, muscular endurance and strength as measured by increased METs and functional capacity (6MWT)       Able to understand and use rate of perceived exertion (RPE) scale  Yes       Intervention  Provide education and explanation on how to use RPE scale       Expected Outcomes  Short Term: Able to use RPE daily in rehab to express subjective intensity level;Long Term:  Able to use RPE to guide intensity level when exercising independently       Knowledge and understanding of  Target Heart Rate Range (THRR)  Yes       Intervention  Provide education and explanation of THRR including how the numbers were predicted and where they are located for reference       Expected Outcomes  Short Term: Able to state/look up THRR;Short Term: Able to use daily as guideline for intensity in rehab;Long Term: Able to use THRR to govern intensity when exercising independently       Able to check pulse independently  Yes       Intervention  Provide education and demonstration on how to check pulse in carotid and radial arteries.;Review the importance of being able to check your own pulse for safety during independent exercise       Expected Outcomes  Short Term: Able to explain why pulse checking is important during independent exercise;Long Term: Able to check pulse independently and accurately  Understanding of Exercise Prescription  Yes       Intervention  Provide education, explanation, and written materials on patient's individual exercise prescription       Expected Outcomes  Short Term: Able to explain program exercise prescription;Long Term: Able to explain home exercise prescription to exercise independently          Exercise Goals Re-Evaluation :   Discharge Exercise Prescription (Final Exercise Prescription Changes): Exercise Prescription Changes - 10/11/18 1400      Response to Exercise   Blood Pressure (Admit)  148/82    Blood Pressure (Exercise)  150/78    Blood Pressure (Exit)  130/78    Heart Rate (Admit)  72 bpm    Heart Rate (Exercise)  95 bpm    Heart Rate (Exit)  63 bpm    Rating of Perceived Exertion (Exercise)  12    Perceived Dyspnea (Exercise)  1    Symptoms  foot drag       Nutrition:  Target Goals: Understanding of nutrition guidelines, daily intake of sodium <152m, cholesterol <203m calories 30% from fat and 7% or less from saturated fats, daily to have 5 or more servings of fruits and vegetables.  Biometrics: Pre Biometrics - 10/11/18  1350      Pre Biometrics   Height  5' 4.25" (1.632 m)    Weight  146 lb 14.4 oz (66.6 kg)    Waist Circumference  32 inches    Hip Circumference  36.5 inches    Waist to Hip Ratio  0.88 %    BMI (Calculated)  25.02        Nutrition Therapy Plan and Nutrition Goals: Nutrition Therapy & Goals - 10/11/18 1409      Intervention Plan   Intervention  Prescribe, educate and counsel regarding individualized specific dietary modifications aiming towards targeted core components such as weight, hypertension, lipid management, diabetes, heart failure and other comorbidities.;Nutrition handout(s) given to patient.    Expected Outcomes  Short Term Goal: Understand basic principles of dietary content, such as calories, fat, sodium, cholesterol and nutrients.;Short Term Goal: A plan has been developed with personal nutrition goals set during dietitian appointment.;Long Term Goal: Adherence to prescribed nutrition plan.       Nutrition Assessments: Nutrition Assessments - 10/11/18 1409      MEDFICTS Scores   Pre Score  0   no appetite. barely eats anything through out the day.       Nutrition Goals Re-Evaluation:   Nutrition Goals Discharge (Final Nutrition Goals Re-Evaluation):   Psychosocial: Target Goals: Acknowledge presence or absence of significant depression and/or stress, maximize coping skills, provide positive support system. Participant is able to verbalize types and ability to use techniques and skills needed for reducing stress and depression.   Initial Review & Psychosocial Screening: Initial Psych Review & Screening - 10/11/18 1401      Initial Review   Current issues with  History of Depression;Current Stress Concerns    Source of Stress Concerns  Poor Coping Skills;Unable to participate in former interests or hobbies;Chronic Illness;Financial    Comments  Ma[place]lees hesistant to do much outside of her home. It is hard for her to get going during the day. She still has very  little appetite and very low energy.       Family Dynamics   Good Support System?  Yes   she relies on her sister, but she is not in good health either. She did not mention any of her  children this time.      Screening Interventions   Interventions  Encouraged to exercise;Program counselor consult;To provide support and resources with identified psychosocial needs    Expected Outcomes  Short Term goal: Utilizing psychosocial counselor, staff and physician to assist with identification of specific Stressors or current issues interfering with healing process. Setting desired goal for each stressor or current issue identified.;Long Term Goal: Stressors or current issues are controlled or eliminated.;Short Term goal: Identification and review with participant of any Quality of Life or Depression concerns found by scoring the questionnaire.;Long Term goal: The participant improves quality of Life and PHQ9 Scores as seen by post scores and/or verbalization of changes       Quality of Life Scores:  Quality of Life - 10/11/18 1403      Quality of Life   Select  Quality of Life      Quality of Life Scores   Health/Function Pre  16.57 %    Socioeconomic Pre  23.5 %    Psych/Spiritual Pre  22.29 %    Family Pre  24.38 %    GLOBAL Pre  20.09 %      Scores of 19 and below usually indicate a poorer quality of life in these areas.  A difference of  2-3 points is a clinically meaningful difference.  A difference of 2-3 points in the total score of the Quality of Life Index has been associated with significant improvement in overall quality of life, self-image, physical symptoms, and general health in studies assessing change in quality of life.  PHQ-9: Recent Review Flowsheet Data    Depression screen [facility] 2/9 10/11/2018 05/09/2018 12/28/2017   Decreased Interest _0 Down, Depressed, Hopeless _1 PHQ - 2 Score _2 Altered sleeping 0 2 2   Tired, decreased energy _3 Change in  appetite _4 Feeling bad or failure about yourself  2 3 0   Trouble concentrating _5 Moving slowly or fidgety/restless 0 1 0   Suicidal thoughts 0 0 0   PHQ-9 Score _6 Difficult doing work/chores Very difficult Somewhat difficult Not difficult at all     Interpretation of Total Score  Total Score Depression Severity:  1-4 = Minimal depression, 5-9 = Mild depression, 10-14 = Moderate depression, 15-19 = Moderately severe depression, 20-27 = Severe depression   Psychosocial Evaluation and Intervention:   Psychosocial Re-Evaluation:   Psychosocial Discharge (Final Psychosocial Re-Evaluation):   Vocational Rehabilitation: Provide vocational rehab assistance to qualifying candidates.   Vocational Rehab Evaluation & Intervention: Vocational Rehab - 10/11/18 1400      Initial Vocational Rehab Evaluation & Intervention   Assessment shows need for Vocational Rehabilitation  No       Education: Education Goals: Education classes will be provided on a variety of topics geared toward better understanding of heart health and risk factor modification. Participant will state understanding/return demonstration of topics presented as noted by education test scores.  Learning Barriers/Preferences: Learning Barriers/Preferences - 10/11/18 1348      Learning Barriers/Preferences   Learning Barriers  Exercise Concerns    Learning Preferences  Audio;Video       Education Topics:  AED/CPR: - Group verbal and written instruction with the use of models to demonstrate the basic use of the AED with the basic ABC's of resuscitation.   Cardiac Rehab from 05/09/2018 in [facility]  Cardiac and Pulmonary Rehab  Date  05/02/18  Educator  CE  Instruction Review Code  1- Verbalizes Understanding      General Nutrition Guidelines/Fats and Fiber: -Group instruction provided by verbal, written material, models and posters to present the general guidelines for heart healthy nutrition.  Gives an explanation and review of dietary fats and fiber.   Controlling Sodium/Reading Food Labels: -Group verbal and written material supporting the discussion of sodium use in heart healthy nutrition. Review and explanation with models, verbal and written materials for utilization of the food label.   Exercise Physiology & General Exercise Guidelines: - Group verbal and written instruction with models to review the exercise physiology of the cardiovascular system and associated critical values. Provides general exercise guidelines with specific guidelines to those with heart or lung disease.    Cardiac Rehab from 05/09/2018 in [facility] Cardiac and Pulmonary Rehab  Date  03/21/18  Educator  [facility]  Instruction Review Code  1- Verbalizes Understanding      Aerobic Exercise & Resistance Training: - Gives group verbal and written instruction on the various components of exercise. Focuses on aerobic and resistive training programs and the benefits of this training and how to safely progress through these programs..   Cardiac Rehab from 05/09/2018 in [facility] Cardiac and Pulmonary Rehab  Date  03/26/18  Educator  [facility]  Instruction Review Code  1- Geologist, engineering, Balance, Mind/Body Relaxation: Provides group verbal/written instruction on the benefits of flexibility and balance training, including mind/body exercise modes such as yoga, pilates and tai chi.  Demonstration and skill practice provided.   Stress and Anxiety: - Provides group verbal and written instruction about the health risks of elevated stress and causes of high stress.  Discuss the correlation between heart/lung disease and anxiety and treatment options. Review healthy ways to manage with stress and anxiety.   Cardiac Rehab from 05/09/2018 in [facility] Cardiac and Pulmonary Rehab  Date  02/13/18  Educator  [facility]  Instruction Review Code  1- Verbalizes Understanding      Depression: - Provides group verbal and  written instruction on the correlation between heart/lung disease and depressed mood, treatment options, and the stigmas associated with seeking treatment.   Cardiac Rehab from 05/09/2018 in [facility] Cardiac and Pulmonary Rehab  Date  03/28/18  Educator  [facility]  Instruction Review Code  1- Verbalizes Understanding      Anatomy & Physiology of the Heart: - Group verbal and written instruction and models provide basic cardiac anatomy and physiology, with the coronary electrical and arterial systems. Review of Valvular disease and Heart Failure   Cardiac Rehab from 05/09/2018 in [facility] Cardiac and Pulmonary Rehab  Date  04/09/18  Educator  CE  Instruction Review Code  1- Verbalizes Understanding      Cardiac Procedures: - Group verbal and written instruction to review commonly prescribed medications for heart disease. Reviews the medication, class of the drug, and side effects. Includes the steps to properly store meds and maintain the prescription regimen. (beta blockers and nitrates)   Cardiac Medications I: - Group verbal and written instruction to review commonly prescribed medications for heart disease. Reviews the medication, class of the drug, and side effects. Includes the steps to properly store meds and maintain the prescription regimen.   Cardiac Rehab from 05/09/2018 in [facility] Cardiac and Pulmonary Rehab  Date  04/16/18  Educator  CE  Instruction Review Code  1- Verbalizes Understanding      Cardiac  Medications II: -Group verbal and written instruction to review commonly prescribed medications for heart disease. Reviews the medication, class of the drug, and side effects. (all other drug classes)   Cardiac Rehab from 05/09/2018 in [facility] Cardiac and Pulmonary Rehab  Date  02/15/18  Educator  [facility]  Instruction Review Code  1- Verbalizes Understanding       Go Sex-Intimacy & Heart Disease, Get SMART - Goal Setting: - Group verbal and written instruction through game format to discuss  heart disease and the return to sexual intimacy. Provides group verbal and written material to discuss and apply goal setting through the application of the S.M.A.R.T. Method.   Other Matters of the Heart: - Provides group verbal, written materials and models to describe Stable Angina and Peripheral Artery. Includes description of the disease process and treatment options available to the cardiac patient.   Cardiac Rehab from 05/09/2018 in [facility] Cardiac and Pulmonary Rehab  Date  04/09/18  Educator  CE  Instruction Review Code  1- Verbalizes Understanding      Exercise & Equipment Safety: - Individual verbal instruction and demonstration of equipment use and safety with use of the equipment.   Cardiac Rehab from 10/11/2018 in [facility] Cardiac and Pulmonary Rehab  Date  10/11/18  Educator  [facility]  Instruction Review Code  1- Verbalizes Understanding      Infection Prevention: - Provides verbal and written material to individual with discussion of infection control including proper hand washing and proper equipment cleaning during exercise session.   Cardiac Rehab from 10/11/2018 in [facility] Cardiac and Pulmonary Rehab  Date  10/11/18  Educator  [facility]  Instruction Review Code  1- Verbalizes Understanding      Falls Prevention: - Provides verbal and written material to individual with discussion of falls prevention and safety.   Cardiac Rehab from 10/11/2018 in [facility] Cardiac and Pulmonary Rehab  Date  10/11/18  Educator  [facility]  Instruction Review Code  1- Verbalizes Understanding      Diabetes: - Individual verbal and written instruction to review signs/symptoms of diabetes, desired ranges of glucose level fasting, after meals and with exercise. Acknowledge that pre and post exercise glucose checks will be done for 3 sessions at entry of program.   Cardiac Rehab from 10/11/2018 in [facility] Cardiac and Pulmonary Rehab  Date  10/11/18  Educator  [facility]  Instruction Review Code  1- Verbalizes Understanding       Know Your Numbers and Risk Factors: -Group verbal and written instruction about important numbers in your health.  Discussion of what are risk factors and how they play a role in the disease process.  Review of Cholesterol, Blood Pressure, Diabetes, and BMI and the role they play in your overall health.   Cardiac Rehab from 05/09/2018 in [facility] Cardiac and Pulmonary Rehab  Date  02/15/18  Educator  [facility]  Instruction Review Code  1- Verbalizes Understanding      Sleep Hygiene: -Provides group verbal and written instruction about how sleep can affect your health.  Define sleep hygiene, discuss sleep cycles and impact of sleep habits. Review good sleep hygiene tips.    Cardiac Rehab from 05/09/2018 in [facility] Cardiac and Pulmonary Rehab  Date  04/25/18  Educator  [facility]  Instruction Review Code  1- Verbalizes Understanding      Other: -Provides group and verbal instruction on various topics (see comments)   Knowledge Questionnaire Score: Knowledge Questionnaire Score - 10/11/18 1359      Knowledge Questionnaire Score  Pre Score  21/26   correct answers reviewed with [facility]. Focus on nutrition and exercise.       Core Components/Risk Factors/Patient Goals at Admission: Personal Goals and Risk Factors at Admission - 10/11/18 1347      Core Components/Risk Factors/Patient Goals on Admission    Weight Management  Yes;Weight Maintenance    Intervention  Weight Management: Develop a combined nutrition and exercise program designed to reach desired caloric intake, while maintaining appropriate intake of nutrient and fiber, sodium and fats, and appropriate energy expenditure required for the weight goal.;Weight Management: Provide education and appropriate resources to help participant work on and attain dietary goals.    Expected Outcomes  Short Term: Continue to assess and modify interventions until short term weight is achieved;Long Term: Adherence to nutrition and physical activity/exercise  program aimed toward attainment of established weight goal;Weight Maintenance: Understanding of the daily nutrition guidelines, which includes 25-35% calories from fat, 7% or less cal from saturated fats, less than '200mg'$  cholesterol, less than 1.5gm of sodium, & 5 or more servings of fruits and vegetables daily;Understanding recommendations for meals to include 15-35% energy as protein, 25-35% energy from fat, 35-60% energy from carbohydrates, less than '200mg'$  of dietary cholesterol, 20-35 gm of total fiber daily;Understanding of distribution of calorie intake throughout the day with the consumption of 4-5 meals/snacks    Diabetes  Yes    Intervention  Provide education about signs/symptoms and action to take for hypo/hyperglycemia.;Provide education about proper nutrition, including hydration, and aerobic/resistive exercise prescription along with prescribed medications to achieve blood glucose in normal ranges: Fasting glucose 65-99 mg/dL    Expected Outcomes  Long Term: Attainment of HbA1C < 7%.;Short Term: Participant verbalizes understanding of the signs/symptoms and immediate care of hyper/hypoglycemia, proper foot care and importance of medication, aerobic/resistive exercise and nutrition plan for blood glucose control.    Hypertension  Yes    Intervention  Provide education on lifestyle modifcations including regular physical activity/exercise, weight management, moderate sodium restriction and increased consumption of fresh fruit, vegetables, and low fat dairy, alcohol moderation, and smoking cessation.;Monitor prescription use compliance.    Expected Outcomes  Short Term: Continued assessment and intervention until BP is < 140/17m HG in hypertensive participants. < 130/856mHG in hypertensive participants with diabetes, heart failure or chronic kidney disease.;Long Term: Maintenance of blood pressure at goal levels.    Lipids  Yes    Intervention  Provide education and support for participant on  nutrition & aerobic/resistive exercise along with prescribed medications to achieve LDL '70mg'$ , HDL >'40mg'$ .    Expected Outcomes  Short Term: Participant states understanding of desired cholesterol values and is compliant with medications prescribed. Participant is following exercise prescription and nutrition guidelines.;Long Term: Cholesterol controlled with medications as prescribed, with individualized exercise RX and with personalized nutrition plan. Value goals: LDL < '70mg'$ , HDL > 40 mg.       Core Components/Risk Factors/Patient Goals Review:    Core Components/Risk Factors/Patient Goals at Discharge (Final Review):    ITP Comments: ITP Comments    Row Name 10/11/18 1339           ITP Comments  Med Review completed. Initial ITP created. [facility]          Comments: Initial ITP

## 2018-10-11 NOTE — Progress Notes (Signed)
Daily Session Note  Patient Details  Name: Kayla Malone MRN: [id number] Date of Birth: 08-04-1961 Referring Provider:     Cardiac Rehab from 10/11/2018 in [facility] Cardiac and Pulmonary Rehab  Referring Provider  [name] Date: 10/11/2018  Check In: Session Check In - 10/11/18 1337      Check-In   Supervising physician immediately available to respond to emergencies  See telemetry face sheet for immediately available ER MD    Location  [facility]-Cardiac & Pulmonary Rehab    Staff Present  Renita Papa, RN Vickki Hearing, BA, ACSM CEP, Exercise Physiologist    Medication changes reported      No    Fall or balance concerns reported     No    Tobacco Cessation  No Change    Warm-up and Cool-down  Not performed (comment)   med review   Resistance Training Performed  Yes    VAD Patient?  No    PAD/SET Patient?  No      Pain Assessment   Currently in Pain?  No/denies        Exercise Prescription Changes - 10/11/18 1400      Response to Exercise   Blood Pressure (Admit)  148/82    Blood Pressure (Exercise)  150/78    Blood Pressure (Exit)  130/78    Heart Rate (Admit)  72 bpm    Heart Rate (Exercise)  95 bpm    Heart Rate (Exit)  63 bpm    Rating of Perceived Exertion (Exercise)  12    Perceived Dyspnea (Exercise)  1    Symptoms  foot drag       Social History   Tobacco Use  Smoking Status Former Smoker  . Last attempt to quit: 09/13/1998  . Years since quitting: 20.0  Smokeless Tobacco Never Used    Goals Met:  Proper associated with RPD/PD & O2 Sat Exercise tolerated well Personal goals reviewed No report of cardiac concerns or symptoms Strength training completed today  Goals Unmet:  Not Applicable  Comments: Med Review completed   Dr. Emily Filbert is Medical Director for [name].

## 2018-10-11 NOTE — Patient Instructions (Signed)
Patient Instructions  Patient Details  Name: Kayla Malone MRN: [id number] Date of Birth: 11/01/1961 Referring Provider:  Dionisio [name], MD  Below are your personal goals for exercise, nutrition, and risk factors. Our goal is to help you stay on track towards obtaining and maintaining these goals. We will be discussing your progress on these goals with you throughout the program.  Initial Exercise Prescription: Initial Exercise Prescription - 10/11/18 1300      Date of Initial Exercise RX and Referring Provider   Date  10/11/18    Referring Provider  Humphrey Rolls      NuStep   Level  1    SPM  80    Minutes  15    METs  2.7      Biostep-RELP   Level  2    SPM  50    Minutes  15    METs  2      Track   Laps  25    Minutes  15    METs  2.15      Prescription Details   Frequency (times per week)  3    Duration  Progress to 45 minutes of aerobic exercise without signs/symptoms of physical distress      Intensity   THRR 40-80% of Max Heartrate  108-144    Ratings of Perceived Exertion  11-13    Perceived Dyspnea  0-4      Resistance Training   Training Prescription  Yes    Weight  3 lb    Reps  10-15       Exercise Goals: Frequency: Be able to perform aerobic exercise two to three times per week in program working toward 2-5 days per week of home exercise.  Intensity: Work with a perceived exertion of 11 (fairly light) - 15 (hard) while following your exercise prescription.  We will make changes to your prescription with you as you progress through the program.   Duration: Be able to do 30 to 45 minutes of continuous aerobic exercise in addition to a 5 minute warm-up and a 5 minute cool-down routine.   Nutrition Goals: Your personal nutrition goals will be established when you do your nutrition analysis with the dietician.  The following are general nutrition guidelines to follow: Cholesterol < 200mg /day Sodium < 1500mg /day Fiber: Women over 50 yrs - 21 grams  per day  Personal Goals: Personal Goals and Risk Factors at Admission - 10/11/18 1347      Core Components/Risk Factors/Patient Goals on Admission    Weight Management  Yes;Weight Maintenance    Intervention  Weight Management: Develop a combined nutrition and exercise program designed to reach desired caloric intake, while maintaining appropriate intake of nutrient and fiber, sodium and fats, and appropriate energy expenditure required for the weight goal.;Weight Management: Provide education and appropriate resources to help participant work on and attain dietary goals.    Expected Outcomes  Short Term: Continue to assess and modify interventions until short term weight is achieved;Long Term: Adherence to nutrition and physical activity/exercise program aimed toward attainment of established weight goal;Weight Maintenance: Understanding of the daily nutrition guidelines, which includes 25-35% calories from fat, 7% or less cal from saturated fats, less than 200mg  cholesterol, less than 1.5gm of sodium, & 5 or more servings of fruits and vegetables daily;Understanding recommendations for meals to include 15-35% energy as protein, 25-35% energy from fat, 35-60% energy from carbohydrates, less than 200mg  of dietary cholesterol, 20-35 gm of total fiber daily;Understanding  of distribution of calorie intake throughout the day with the consumption of 4-5 meals/snacks    Diabetes  Yes    Intervention  Provide education about signs/symptoms and action to take for hypo/hyperglycemia.;Provide education about proper nutrition, including hydration, and aerobic/resistive exercise prescription along with prescribed medications to achieve blood glucose in normal ranges: Fasting glucose 65-99 mg/dL    Expected Outcomes  Long Term: Attainment of HbA1C < 7%.;Short Term: Participant verbalizes understanding of the signs/symptoms and immediate care of hyper/hypoglycemia, proper foot care and importance of medication,  aerobic/resistive exercise and nutrition plan for blood glucose control.    Hypertension  Yes    Intervention  Provide education on lifestyle modifcations including regular physical activity/exercise, weight management, moderate sodium restriction and increased consumption of fresh fruit, vegetables, and low fat dairy, alcohol moderation, and smoking cessation.;Monitor prescription use compliance.    Expected Outcomes  Short Term: Continued assessment and intervention until BP is < 140/75mm HG in hypertensive participants. < 130/12mm HG in hypertensive participants with diabetes, heart failure or chronic kidney disease.;Long Term: Maintenance of blood pressure at goal levels.    Lipids  Yes    Intervention  Provide education and support for participant on nutrition & aerobic/resistive exercise along with prescribed medications to achieve LDL 70mg , HDL >40mg .    Expected Outcomes  Short Term: Participant states understanding of desired cholesterol values and is compliant with medications prescribed. Participant is following exercise prescription and nutrition guidelines.;Long Term: Cholesterol controlled with medications as prescribed, with individualized exercise RX and with personalized nutrition plan. Value goals: LDL < 70mg , HDL > 40 mg.       Tobacco Use Initial Evaluation: Social History   Tobacco Use  Smoking Status Former Smoker  . Last attempt to quit: 09/13/1998  . Years since quitting: 20.0  Smokeless Tobacco Never Used    Exercise Goals and Review: Exercise Goals    Row Name 10/11/18 1350             Exercise Goals   Increase Physical Activity  Yes       Intervention  Provide advice, education, support and counseling about physical activity/exercise needs.;Develop an individualized exercise prescription for aerobic and resistive training based on initial evaluation findings, risk stratification, comorbidities and participant's personal goals.       Expected Outcomes  Short  Term: Attend rehab on a regular basis to increase amount of physical activity.;Long Term: Add in home exercise to make exercise part of routine and to increase amount of physical activity.;Long Term: Exercising regularly at least 3-5 days a week.       Increase Strength and Stamina  Yes       Intervention  Provide advice, education, support and counseling about physical activity/exercise needs.;Develop an individualized exercise prescription for aerobic and resistive training based on initial evaluation findings, risk stratification, comorbidities and participant's personal goals.       Expected Outcomes  Short Term: Increase workloads from initial exercise prescription for resistance, speed, and METs.;Short Term: Perform resistance training exercises routinely during rehab and add in resistance training at home;Long Term: Improve cardiorespiratory fitness, muscular endurance and strength as measured by increased METs and functional capacity (6MWT)       Able to understand and use rate of perceived exertion (RPE) scale  Yes       Intervention  Provide education and explanation on how to use RPE scale       Expected Outcomes  Short Term: Able to use RPE daily in  rehab to express subjective intensity level;Long Term:  Able to use RPE to guide intensity level when exercising independently       Knowledge and understanding of Target Heart Rate Range (THRR)  Yes       Intervention  Provide education and explanation of THRR including how the numbers were predicted and where they are located for reference       Expected Outcomes  Short Term: Able to state/look up THRR;Short Term: Able to use daily as guideline for intensity in rehab;Long Term: Able to use THRR to govern intensity when exercising independently       Able to check pulse independently  Yes       Intervention  Provide education and demonstration on how to check pulse in carotid and radial arteries.;Review the importance of being able to check your  own pulse for safety during independent exercise       Expected Outcomes  Short Term: Able to explain why pulse checking is important during independent exercise;Long Term: Able to check pulse independently and accurately       Understanding of Exercise Prescription  Yes       Intervention  Provide education, explanation, and written materials on patient's individual exercise prescription       Expected Outcomes  Short Term: Able to explain program exercise prescription;Long Term: Able to explain home exercise prescription to exercise independently          Copy of goals given to participant.

## 2018-10-15 ENCOUNTER — Encounter: Payer: Medicare Other | Attending: Cardiovascular Disease

## 2018-10-15 DIAGNOSIS — I214 Non-ST elevation (NSTEMI) myocardial infarction: Secondary | ICD-10-CM | POA: Insufficient documentation

## 2018-10-15 DIAGNOSIS — Z955 Presence of coronary angioplasty implant and graft: Secondary | ICD-10-CM | POA: Insufficient documentation

## 2018-10-17 ENCOUNTER — Encounter: Payer: Self-pay | Admitting: *Deleted

## 2018-10-17 ENCOUNTER — Encounter: Payer: Medicare Other | Admitting: *Deleted

## 2018-10-17 DIAGNOSIS — Z955 Presence of coronary angioplasty implant and graft: Secondary | ICD-10-CM

## 2018-10-17 DIAGNOSIS — I214 Non-ST elevation (NSTEMI) myocardial infarction: Secondary | ICD-10-CM | POA: Diagnosis not present

## 2018-10-17 LAB — GLUCOSE, CAPILLARY
GLUCOSE-CAPILLARY: 112 mg/dL — AB (ref 70–99)
GLUCOSE-CAPILLARY: 86 mg/dL (ref 70–99)

## 2018-10-17 NOTE — Progress Notes (Signed)
Daily Session Note  Patient Details  Name: Kayla Malone MRN: [id number] Date of Birth: 1961/08/28 Referring Provider:     Cardiac Rehab from 10/11/2018 in [facility] Cardiac and Pulmonary Rehab  Referring Provider  Humphrey Rolls      Encounter Date: 10/17/2018  Check In: Session Check In - 10/17/18 1640      Check-In   Supervising physician immediately available to respond to emergencies  See telemetry face sheet for immediately available ER MD    Location  [facility]-Cardiac & Pulmonary Rehab    Staff Present  Renita Papa, RN Vickki Hearing, BA, ACSM CEP, Exercise Physiologist;Alvester Eads, RN, BSN    Medication changes reported      No    Fall or balance concerns reported     No    Tobacco Cessation  No Change    Warm-up and Cool-down  Performed on first and last piece of equipment    Resistance Training Performed  Yes    VAD Patient?  No    PAD/SET Patient?  No      Pain Assessment   Currently in Pain?  No/denies          Social History   Tobacco Use  Smoking Status Former Smoker  . Last attempt to quit: 09/13/1998  . Years since quitting: 20.1  Smokeless Tobacco Never Used    Goals Met:  Proper associated with RPD/PD & O2 Sat Exercise tolerated well No report of cardiac concerns or symptoms Strength training completed today  Goals Unmet:  Not Applicable  Comments:  First full day of exercise!  Patient was oriented to gym and equipment including functions, settings, policies, and procedures.  Patient's individual exercise prescription and treatment plan were reviewed.  All starting workloads were established based on the results of the 6 minute walk test done at initial orientation visit.  The plan for exercise progression was also introduced and progression will be customized based on patient's performance and goals.    Dr. Emily Filbert is Medical Director for [facility] [name].

## 2018-10-17 NOTE — Progress Notes (Signed)
Cardiac Individual Treatment Plan  Patient Details  Name: Kayla Malone MRN: [id number] Date of Birth: [birth year] Referring Provider:     Cardiac Rehab from 10/11/2018 in [facility] Cardiac and Pulmonary Rehab  Referring Provider  Humphrey Rolls      Initial Encounter Date:    Cardiac Rehab from 10/11/2018 in [facility] Cardiac and Pulmonary Rehab  Date  10/11/18      Visit Diagnosis: NSTEMI (non-ST elevated myocardial infarction) [facility])  Patient's Home Medications on Admission:  Current Outpatient Medications:  .  acetaminophen (TYLENOL) 500 MG tablet, Take 1,000 mg by mouth every 6 (six) hours as needed for mild pain or headache., Disp: , Rfl:  .  albuterol (ACCUNEB) 0.63 MG/3ML nebulizer solution, Take 1 ampule by nebulization every 6 (six) hours as needed for wheezing., Disp: , Rfl:  .  albuterol (PROVENTIL HFA;VENTOLIN HFA) 108 (90 Base) MCG/ACT inhaler, Inhale 2 puffs into the lungs every 6 (six) hours as needed for wheezing or shortness of breath. , Disp: , Rfl:  .  aspirin EC 81 MG tablet, Take 81 mg by mouth daily., Disp: , Rfl:  .  atorvastatin (LIPITOR) 40 MG tablet, Take 80 mg by mouth daily. , Disp: , Rfl:  .  fluticasone (FLONASE) 50 MCG/ACT nasal spray, Place 2 sprays into both nostrils daily., Disp: , Rfl:  .  Fluticasone-Salmeterol (ADVAIR DISKUS) 500-50 MCG/DOSE AEPB, Inhale 1 puff into the lungs daily as needed (SOB). , Disp: , Rfl:  .  losartan (COZAAR) 50 MG tablet, Take 50 mg by mouth daily., Disp: , Rfl: 2 .  metFORMIN (GLUCOPHAGE) 500 MG tablet, Take 1,000 mg by mouth every morning. , Disp: , Rfl: 5 .  metFORMIN (GLUCOPHAGE) 500 MG tablet, Take 500 mg by mouth every evening., Disp: , Rfl:  .  metoprolol succinate (TOPROL-XL) 50 MG 24 hr tablet, Take 50 mg by mouth daily. Take with or immediately following a meal., Disp: , Rfl:  .  nitroGLYCERIN (NITROSTAT) 0.4 MG SL tablet, Place 1 tablet (0.4 mg total) under the tongue every 5 (five) minutes x 3 doses as needed for chest  pain., Disp: 25 tablet, Rfl: 0 .  pantoprazole (PROTONIX) 40 MG tablet, Take 40 mg by mouth. , Disp: , Rfl:  .  sitaGLIPtin (JANUVIA) 50 MG tablet, Take 50 mg by mouth daily., Disp: , Rfl:  .  ticagrelor (BRILINTA) 90 MG TABS tablet, Take 1 tablet (90 mg total) by mouth 2 (two) times daily., Disp: 30 tablet, Rfl: 0 .  traZODone (DESYREL) 50 MG tablet, Take 50 mg by mouth at bedtime., Disp: , Rfl: 5 .  VOLTAREN 1 % GEL, Apply 2 g topically as needed (pain in shoulder). , Disp: , Rfl: 2  Past Medical History: Past Medical History:  Diagnosis Date  . Asthma   . Coronary artery disease   . Depression   . Diabetes mellitus without complication ([place])   . GERD (gastroesophageal reflux disease)   . History of hiatal hernia   . Hypertension   . Pain    BACK  . Stroke ([place])    x3  . Thyroid cyst     Tobacco Use: Social History   Tobacco Use  Smoking Status Former Smoker  . Last attempt to quit: 09/13/1998  . Years since quitting: 20.1  Smokeless Tobacco Never Used    Labs: Recent Chemical engineer    Labs for ITP Cardiac and Pulmonary Rehab Latest Ref Rng & Units 10/23/2017 10/23/2017 10/23/2017 10/23/2017 10/24/2017   Hemoglobin A1c  4.8 - 5.6 % 7.3(H) - - - -   PHART 7.350 - 7.450 - 7.359 7.402 - -   PCO2ART 32.0 - 48.0 mmHg - 44.0 41.4 - -   HCO3 20.0 - 28.0 mmol/L - 25.1 26.0 - -   TCO2 22 - 32 mmol/L - '26 27 25 28   '$ ACIDBASEDEF 0.0 - 2.0 mmol/L - 1.0 - - -   O2SAT % - 99.0 99.0 - -       Exercise Target Goals: Exercise Program Goal: Individual exercise prescription set using results from initial 6 min walk test and THRR while considering  patient's activity barriers and safety.   Exercise Prescription Goal: Initial exercise prescription builds to 30-45 minutes a day of aerobic activity, 2-3 days per week.  Home exercise guidelines will be given to patient during program as part of exercise prescription that the participant will acknowledge.  Activity Barriers &  Risk Stratification:   6 Minute Walk: 6 Minute Walk    Row Name 10/11/18 1351         6 Minute Walk   Phase  Initial     Distance  700 feet     Walk Time  5.75 minutes     # of Rest Breaks  1     MPH  1.32     METS  2.72     RPE  12     Perceived Dyspnea   1     VO2 Peak  9.5     Symptoms  Yes (comment)     Comments  foot drag when she fatigues     Resting HR  72 bpm     Resting BP  148/82     Resting Oxygen Saturation   97 %     Exercise Oxygen Saturation  during 6 min walk  98 %     Max Ex. HR  95 bpm     Max Ex. BP  150/78     2 Minute Post BP  130/78        Oxygen Initial Assessment:   Oxygen Re-Evaluation:   Oxygen Discharge (Final Oxygen Re-Evaluation):   Initial Exercise Prescription: Initial Exercise Prescription - 10/11/18 1300      Date of Initial Exercise RX and Referring Provider   Date  10/11/18    Referring Provider  Humphrey Rolls      NuStep   Level  1    SPM  80    Minutes  15    METs  2.7      Biostep-RELP   Level  2    SPM  50    Minutes  15    METs  2      Track   Laps  25    Minutes  15    METs  2.15      Prescription Details   Frequency (times per week)  3    Duration  Progress to 45 minutes of aerobic exercise without signs/symptoms of physical distress      Intensity   THRR 40-80% of Max Heartrate  108-144    Ratings of Perceived Exertion  11-13    Perceived Dyspnea  0-4      Resistance Training   Training Prescription  Yes    Weight  3 lb    Reps  10-15       Perform Capillary Blood Glucose checks as needed.  Exercise Prescription Changes: Exercise Prescription Changes    Row Name 10/11/18  1400             Response to Exercise   Blood Pressure (Admit)  148/82       Blood Pressure (Exercise)  150/78       Blood Pressure (Exit)  130/78       Heart Rate (Admit)  72 bpm       Heart Rate (Exercise)  95 bpm       Heart Rate (Exit)  63 bpm       Rating of Perceived Exertion (Exercise)  12       Perceived Dyspnea  (Exercise)  1       Symptoms  foot drag          Exercise Comments:   Exercise Goals and Review: Exercise Goals    Row Name 10/11/18 1350             Exercise Goals   Increase Physical Activity  Yes       Intervention  Provide advice, education, support and counseling about physical activity/exercise needs.;Develop an individualized exercise prescription for aerobic and resistive training based on initial evaluation findings, risk stratification, comorbidities and participant's personal goals.       Expected Outcomes  Short Term: Attend rehab on a regular basis to increase amount of physical activity.;Long Term: Add in home exercise to make exercise part of routine and to increase amount of physical activity.;Long Term: Exercising regularly at least 3-5 days a week.       Increase Strength and Stamina  Yes       Intervention  Provide advice, education, support and counseling about physical activity/exercise needs.;Develop an individualized exercise prescription for aerobic and resistive training based on initial evaluation findings, risk stratification, comorbidities and participant's personal goals.       Expected Outcomes  Short Term: Increase workloads from initial exercise prescription for resistance, speed, and METs.;Short Term: Perform resistance training exercises routinely during rehab and add in resistance training at home;Long Term: Improve cardiorespiratory fitness, muscular endurance and strength as measured by increased METs and functional capacity (6MWT)       Able to understand and use rate of perceived exertion (RPE) scale  Yes       Intervention  Provide education and explanation on how to use RPE scale       Expected Outcomes  Short Term: Able to use RPE daily in rehab to express subjective intensity level;Long Term:  Able to use RPE to guide intensity level when exercising independently       Knowledge and understanding of Target Heart Rate Range (THRR)  Yes        Intervention  Provide education and explanation of THRR including how the numbers were predicted and where they are located for reference       Expected Outcomes  Short Term: Able to state/look up THRR;Short Term: Able to use daily as guideline for intensity in rehab;Long Term: Able to use THRR to govern intensity when exercising independently       Able to check pulse independently  Yes       Intervention  Provide education and demonstration on how to check pulse in carotid and radial arteries.;Review the importance of being able to check your own pulse for safety during independent exercise       Expected Outcomes  Short Term: Able to explain why pulse checking is important during independent exercise;Long Term: Able to check pulse independently and accurately       Understanding  of Exercise Prescription  Yes       Intervention  Provide education, explanation, and written materials on patient's individual exercise prescription       Expected Outcomes  Short Term: Able to explain program exercise prescription;Long Term: Able to explain home exercise prescription to exercise independently          Exercise Goals Re-Evaluation :   Discharge Exercise Prescription (Final Exercise Prescription Changes): Exercise Prescription Changes - 10/11/18 1400      Response to Exercise   Blood Pressure (Admit)  148/82    Blood Pressure (Exercise)  150/78    Blood Pressure (Exit)  130/78    Heart Rate (Admit)  72 bpm    Heart Rate (Exercise)  95 bpm    Heart Rate (Exit)  63 bpm    Rating of Perceived Exertion (Exercise)  12    Perceived Dyspnea (Exercise)  1    Symptoms  foot drag       Nutrition:  Target Goals: Understanding of nutrition guidelines, daily intake of sodium '1500mg'$ , cholesterol '200mg'$ , calories 30% from fat and 7% or less from saturated fats, daily to have 5 or more servings of fruits and vegetables.  Biometrics: Pre Biometrics - 10/11/18 1350      Pre Biometrics   Height  5'  4.25" (1.632 m)    Weight  146 lb 14.4 oz (66.6 kg)    Waist Circumference  32 inches    Hip Circumference  36.5 inches    Waist to Hip Ratio  0.88 %    BMI (Calculated)  25.02        Nutrition Therapy Plan and Nutrition Goals: Nutrition Therapy & Goals - 10/11/18 1409      Intervention Plan   Intervention  Prescribe, educate and counsel regarding individualized specific dietary modifications aiming towards targeted core components such as weight, hypertension, lipid management, diabetes, heart failure and other comorbidities.;Nutrition handout(s) given to patient.    Expected Outcomes  Short Term Goal: Understand basic principles of dietary content, such as calories, fat, sodium, cholesterol and nutrients.;Short Term Goal: A plan has been developed with personal nutrition goals set during dietitian appointment.;Long Term Goal: Adherence to prescribed nutrition plan.       Nutrition Assessments: Nutrition Assessments - 10/11/18 1409      MEDFICTS Scores   Pre Score  0   no appetite. barely eats anything through out the day.       Nutrition Goals Re-Evaluation:   Nutrition Goals Discharge (Final Nutrition Goals Re-Evaluation):   Psychosocial: Target Goals: Acknowledge presence or absence of significant depression and/or stress, maximize coping skills, provide positive support system. Participant is able to verbalize types and ability to use techniques and skills needed for reducing stress and depression.   Initial Review & Psychosocial Screening: Initial Psych Review & Screening - 10/11/18 1401      Initial Review   Current issues with  History of Depression;Current Stress Concerns    Source of Stress Concerns  Poor Coping Skills;Unable to participate in former interests or hobbies;Chronic Illness;Financial    Comments  Annina is hesistant to do much outside of her home. It is hard for her to get going during the day. She still has very little appetite and very low energy.        Family Dynamics   Good Support System?  Yes   she relies on her sister, but she is not in good health either. She did not mention any of her children  this time.      Screening Interventions   Interventions  Encouraged to exercise;Program counselor consult;To provide support and resources with identified psychosocial needs    Expected Outcomes  Short Term goal: Utilizing psychosocial counselor, staff and physician to assist with identification of specific Stressors or current issues interfering with healing process. Setting desired goal for each stressor or current issue identified.;Long Term Goal: Stressors or current issues are controlled or eliminated.;Short Term goal: Identification and review with participant of any Quality of Life or Depression concerns found by scoring the questionnaire.;Long Term goal: The participant improves quality of Life and PHQ9 Scores as seen by post scores and/or verbalization of changes       Quality of Life Scores:  Quality of Life - 10/11/18 1403      Quality of Life   Select  Quality of Life      Quality of Life Scores   Health/Function Pre  16.57 %    Socioeconomic Pre  23.5 %    Psych/Spiritual Pre  22.29 %    Family Pre  24.38 %    GLOBAL Pre  20.09 %      Scores of 19 and below usually indicate a poorer quality of life in these areas.  A difference of  2-3 points is a clinically meaningful difference.  A difference of 2-3 points in the total score of the Quality of Life Index has been associated with significant improvement in overall quality of life, self-image, physical symptoms, and general health in studies assessing change in quality of life.  PHQ-9: Recent Review Flowsheet Data    Depression screen [facility] 2/9 10/11/2018 05/09/2018 12/28/2017   Decreased Interest '3 2 2   '$ Down, Depressed, Hopeless '3 2 2   '$ PHQ - 2 Score '6 4 4   '$ Altered sleeping 0 2 2   Tired, decreased energy '3 2 2   '$ Change in appetite '3 2 3   '$ Feeling bad or failure about  yourself  2 3 0   Trouble concentrating '3 2 1   '$ Moving slowly or fidgety/restless 0 1 0   Suicidal thoughts 0 0 0   PHQ-9 Score '17 16 12   '$ Difficult doing work/chores Very difficult Somewhat difficult Not difficult at all     Interpretation of Total Score  Total Score Depression Severity:  1-4 = Minimal depression, 5-9 = Mild depression, 10-14 = Moderate depression, 15-19 = Moderately severe depression, 20-27 = Severe depression   Psychosocial Evaluation and Intervention: Psychosocial Evaluation - 10/17/18 1619      Psychosocial Evaluation & Interventions   Interventions  Stress management education;Relaxation education;Encouraged to exercise with the program and follow exercise prescription    Comments  Counselor met with Ms. Keturah Barre [facility]) today for her return to Cardiac Rehab after 6 months - due to a mild heart attack and stent insertion.  Rosena will be 57 years old this weekend and has a strong support system with (4) adult children and a significant other.  She reports sleeping "better" with the use of a prescription med but doesn't have much of an appetite.  She a history of depression and reports taking meds to help with this.  Her PHQ-9 scores are 17 indicating moderately severe symptoms of depression are present.  She has multiple stressors with her health; being disabled and finances.  Brin has goals to "get heart healthy" and get back to church - where she had just returned the day she had her mild heart attack.  Counselor will follow.      Expected Outcomes  Short:  Kateria will continue taking her mood medication and will contact her Dr. if her symptoms do not improve in the near future - with the use of consistent exercise.   Long:  Ritu will continue to practice positive self-care for her health and mental health for an improved quality of life.    Continue Psychosocial Services   Follow up required by counselor       Psychosocial Re-Evaluation:   Psychosocial Discharge (Final  Psychosocial Re-Evaluation):   Vocational Rehabilitation: Provide vocational rehab assistance to qualifying candidates.   Vocational Rehab Evaluation & Intervention: Vocational Rehab - 10/11/18 1400      Initial Vocational Rehab Evaluation & Intervention   Assessment shows need for Vocational Rehabilitation  No       Education: Education Goals: Education classes will be provided on a variety of topics geared toward better understanding of heart health and risk factor modification. Participant will state understanding/return demonstration of topics presented as noted by education test scores.  Learning Barriers/Preferences: Learning Barriers/Preferences - 10/11/18 1348      Learning Barriers/Preferences   Learning Barriers  Exercise Concerns    Learning Preferences  Audio;Video       Education Topics:  AED/CPR: - Group verbal and written instruction with the use of models to demonstrate the basic use of the AED with the basic ABC's of resuscitation.   Cardiac Rehab from 05/09/2018 in [facility] Cardiac and Pulmonary Rehab  Date  05/02/18  Educator  CE  Instruction Review Code  1- Verbalizes Understanding      General Nutrition Guidelines/Fats and Fiber: -Group instruction provided by verbal, written material, models and posters to present the general guidelines for heart healthy nutrition. Gives an explanation and review of dietary fats and fiber.   Controlling Sodium/Reading Food Labels: -Group verbal and written material supporting the discussion of sodium use in heart healthy nutrition. Review and explanation with models, verbal and written materials for utilization of the food label.   Cardiac Rehab from 10/17/2018 in [facility] Cardiac and Pulmonary Rehab  Date  10/17/18  Educator  LB  Instruction Review Code  1- Verbalizes Understanding      Exercise Physiology & General Exercise Guidelines: - Group verbal and written instruction with models to review the exercise physiology  of the cardiovascular system and associated critical values. Provides general exercise guidelines with specific guidelines to those with heart or lung disease.    Cardiac Rehab from 05/09/2018 in [facility] Cardiac and Pulmonary Rehab  Date  03/21/18  Educator  [facility]  Instruction Review Code  1- Verbalizes Understanding      Aerobic Exercise & Resistance Training: - Gives group verbal and written instruction on the various components of exercise. Focuses on aerobic and resistive training programs and the benefits of this training and how to safely progress through these programs..   Cardiac Rehab from 05/09/2018 in [facility] Cardiac and Pulmonary Rehab  Date  03/26/18  Educator  [facility]  Instruction Review Code  1- Geologist, engineering, Balance, Mind/Body Relaxation: Provides group verbal/written instruction on the benefits of flexibility and balance training, including mind/body exercise modes such as yoga, pilates and tai chi.  Demonstration and skill practice provided.   Stress and Anxiety: - Provides group verbal and written instruction about the health risks of elevated stress and causes of high stress.  Discuss the correlation between heart/lung disease and anxiety and treatment options. Review  healthy ways to manage with stress and anxiety.   Cardiac Rehab from 05/09/2018 in [facility] Cardiac and Pulmonary Rehab  Date  02/13/18  Educator  [facility]  Instruction Review Code  1- Verbalizes Understanding      Depression: - Provides group verbal and written instruction on the correlation between heart/lung disease and depressed mood, treatment options, and the stigmas associated with seeking treatment.   Cardiac Rehab from 05/09/2018 in [facility] Cardiac and Pulmonary Rehab  Date  03/28/18  Educator  [facility]  Instruction Review Code  1- Verbalizes Understanding      Anatomy & Physiology of the Heart: - Group verbal and written instruction and models provide basic cardiac anatomy and physiology,  with the coronary electrical and arterial systems. Review of Valvular disease and Heart Failure   Cardiac Rehab from 05/09/2018 in [facility] Cardiac and Pulmonary Rehab  Date  04/09/18  Educator  CE  Instruction Review Code  1- Verbalizes Understanding      Cardiac Procedures: - Group verbal and written instruction to review commonly prescribed medications for heart disease. Reviews the medication, class of the drug, and side effects. Includes the steps to properly store meds and maintain the prescription regimen. (beta blockers and nitrates)   Cardiac Medications I: - Group verbal and written instruction to review commonly prescribed medications for heart disease. Reviews the medication, class of the drug, and side effects. Includes the steps to properly store meds and maintain the prescription regimen.   Cardiac Rehab from 05/09/2018 in [facility] Cardiac and Pulmonary Rehab  Date  04/16/18  Educator  CE  Instruction Review Code  1- Verbalizes Understanding      Cardiac Medications II: -Group verbal and written instruction to review commonly prescribed medications for heart disease. Reviews the medication, class of the drug, and side effects. (all other drug classes)   Cardiac Rehab from 05/09/2018 in [facility] Cardiac and Pulmonary Rehab  Date  02/15/18  Educator  [facility]  Instruction Review Code  1- Verbalizes Understanding       Go Sex-Intimacy & Heart Disease, Get SMART - Goal Setting: - Group verbal and written instruction through game format to discuss heart disease and the return to sexual intimacy. Provides group verbal and written material to discuss and apply goal setting through the application of the S.M.A.R.T. Method.   Other Matters of the Heart: - Provides group verbal, written materials and models to describe Stable Angina and Peripheral Artery. Includes description of the disease process and treatment options available to the cardiac patient.   Cardiac Rehab from 05/09/2018 in [facility]  Cardiac and Pulmonary Rehab  Date  04/09/18  Educator  CE  Instruction Review Code  1- Verbalizes Understanding      Exercise & Equipment Safety: - Individual verbal instruction and demonstration of equipment use and safety with use of the equipment.   Cardiac Rehab from 10/17/2018 in [facility] Cardiac and Pulmonary Rehab  Date  10/11/18  Educator  [facility]  Instruction Review Code  1- Verbalizes Understanding      Infection Prevention: - Provides verbal and written material to individual with discussion of infection control including proper hand washing and proper equipment cleaning during exercise session.   Cardiac Rehab from 10/17/2018 in [facility] Cardiac and Pulmonary Rehab  Date  10/11/18  Educator  [facility]  Instruction Review Code  1- Verbalizes Understanding      Falls Prevention: - Provides verbal and written material to individual with discussion of falls prevention and safety.   Cardiac Rehab from  10/17/2018 in [facility] Cardiac and Pulmonary Rehab  Date  10/11/18  Educator  [facility]  Instruction Review Code  1- Verbalizes Understanding      Diabetes: - Individual verbal and written instruction to review signs/symptoms of diabetes, desired ranges of glucose level fasting, after meals and with exercise. Acknowledge that pre and post exercise glucose checks will be done for 3 sessions at entry of program.   Cardiac Rehab from 10/17/2018 in [facility] Cardiac and Pulmonary Rehab  Date  10/11/18  Educator  [facility]  Instruction Review Code  1- Verbalizes Understanding      Know Your Numbers and Risk Factors: -Group verbal and written instruction about important numbers in your health.  Discussion of what are risk factors and how they play a role in the disease process.  Review of Cholesterol, Blood Pressure, Diabetes, and BMI and the role they play in your overall health.   Cardiac Rehab from 05/09/2018 in [facility] Cardiac and Pulmonary Rehab  Date  02/15/18  Educator  [facility]  Instruction Review Code  1- Verbalizes  Understanding      Sleep Hygiene: -Provides group verbal and written instruction about how sleep can affect your health.  Define sleep hygiene, discuss sleep cycles and impact of sleep habits. Review good sleep hygiene tips.    Cardiac Rehab from 05/09/2018 in [facility]  Date  04/25/18  Educator  [facility]  Instruction Review Code  1- Verbalizes Understanding      Other: -Provides group and verbal instruction on various topics (see comments)   Knowledge Questionnaire Score: Knowledge Questionnaire Score - 10/11/18 1359      Knowledge Questionnaire Score   Pre Score  21/26   correct answers reviewed with [facility]. Focus on nutrition and exercise.       Core Components/Risk Factors/Patient Goals at Admission: Personal Goals and Risk Factors at Admission - 10/11/18 1347      Core Components/Risk Factors/Patient Goals on Admission    Weight Management  Yes;Weight Maintenance    Intervention  Weight Management: Develop a combined nutrition and exercise program designed to reach desired caloric intake, while maintaining appropriate intake of nutrient and fiber, sodium and fats, and appropriate energy expenditure required for the weight goal.;Weight Management: Provide education and appropriate resources to help participant work on and attain dietary goals.    Expected Outcomes  Short Term: Continue to assess and modify interventions until short term weight is achieved;Long Term: Adherence to nutrition and physical activity/exercise program aimed toward attainment of established weight goal;Weight Maintenance: Understanding of the daily nutrition guidelines, which includes 25-35% calories from fat, 7% or less cal from saturated fats, less than '200mg'$  cholesterol, less than 1.5gm of sodium, & 5 or more servings of fruits and vegetables daily;Understanding recommendations for meals to include 15-35% energy as protein, 25-35% energy from fat, 35-60% energy from carbohydrates, less  than '200mg'$  of dietary cholesterol, 20-35 gm of total fiber daily;Understanding of distribution of calorie intake throughout the day with the consumption of 4-5 meals/snacks    Diabetes  Yes    Intervention  Provide education about signs/symptoms and action to take for hypo/hyperglycemia.;Provide education about proper nutrition, including hydration, and aerobic/resistive exercise prescription along with prescribed medications to achieve blood glucose in normal ranges: Fasting glucose 65-99 mg/dL    Expected Outcomes  Long Term: Attainment of HbA1C < 7%.;Short Term: Participant verbalizes understanding of the signs/symptoms and immediate care of hyper/hypoglycemia, proper foot care and importance of medication, aerobic/resistive exercise and nutrition plan for  blood glucose control.    Hypertension  Yes    Intervention  Provide education on lifestyle modifcations including regular physical activity/exercise, weight management, moderate sodium restriction and increased consumption of fresh fruit, vegetables, and low fat dairy, alcohol moderation, and smoking cessation.;Monitor prescription use compliance.    Expected Outcomes  Short Term: Continued assessment and intervention until BP is < 140/92m HG in hypertensive participants. < 130/849mHG in hypertensive participants with diabetes, heart failure or chronic kidney disease.;Long Term: Maintenance of blood pressure at goal levels.    Lipids  Yes    Intervention  Provide education and support for participant on nutrition & aerobic/resistive exercise along with prescribed medications to achieve LDL '70mg'$ , HDL >'40mg'$ .    Expected Outcomes  Short Term: Participant states understanding of desired cholesterol values and is compliant with medications prescribed. Participant is following exercise prescription and nutrition guidelines.;Long Term: Cholesterol controlled with medications as prescribed, with individualized exercise RX and with personalized nutrition  plan. Value goals: LDL < '70mg'$ , HDL > 40 mg.       Core Components/Risk Factors/Patient Goals Review:    Core Components/Risk Factors/Patient Goals at Discharge (Final Review):    ITP Comments: ITP Comments    Row Name 10/11/18 1339 10/17/18 0611         ITP Comments  Med Review completed. Initial ITP created. Diagnosis can be found in [facility] 10/14  30 day review. Continue with ITP unless direccted changes per Medical Director Chart Review.         Comments: First full day of exercise!  Patient was oriented to gym and equipment including functions, settings, policies, and procedures.  Patient's individual exercise prescription and treatment plan were reviewed.  All starting workloads were established based on the results of the 6 minute walk test done at initial orientation visit.  The plan for exercise progression was also introduced and progression will be customized based on patient's performance and goals

## 2018-10-17 NOTE — Progress Notes (Signed)
Cardiac Individual Treatment Plan  Patient Details  Name: Kayla Malone MRN: [id number] Date of Birth: 10/23/1961 Referring Provider:     Cardiac Rehab from 10/11/2018 in [facility] Cardiac and Pulmonary Rehab  Referring Provider  Kayla Malone      Initial Encounter Date:    Cardiac Rehab from 10/11/2018 in [facility] Cardiac and Pulmonary Rehab  Date  10/11/18      Visit Diagnosis: NSTEMI (non-ST elevated myocardial infarction) [name])  Status post coronary artery stent placement  Patient's Home Medications on Admission:  Current Outpatient Medications:  .  acetaminophen (TYLENOL) 500 MG tablet, Take 1,000 mg by mouth every 6 (six) hours as needed for mild pain or headache., Disp: , Rfl:  .  albuterol (ACCUNEB) 0.63 MG/3ML nebulizer solution, Take 1 ampule by nebulization every 6 (six) hours as needed for wheezing., Disp: , Rfl:  .  albuterol (PROVENTIL HFA;VENTOLIN HFA) 108 (90 Base) MCG/ACT inhaler, Inhale 2 puffs into the lungs every 6 (six) hours as needed for wheezing or shortness of breath. , Disp: , Rfl:  .  aspirin EC 81 MG tablet, Take 81 mg by mouth daily., Disp: , Rfl:  .  atorvastatin (LIPITOR) 40 MG tablet, Take 80 mg by mouth daily. , Disp: , Rfl:  .  fluticasone (FLONASE) 50 MCG/ACT nasal spray, Place 2 sprays into both nostrils daily., Disp: , Rfl:  .  Fluticasone-Salmeterol (ADVAIR DISKUS) 500-50 MCG/DOSE AEPB, Inhale 1 puff into the lungs daily as needed (SOB). , Disp: , Rfl:  .  losartan (COZAAR) 50 MG tablet, Take 50 mg by mouth daily., Disp: , Rfl: 2 .  metFORMIN (GLUCOPHAGE) 500 MG tablet, Take 1,000 mg by mouth every morning. , Disp: , Rfl: 5 .  metFORMIN (GLUCOPHAGE) 500 MG tablet, Take 500 mg by mouth every evening., Disp: , Rfl:  .  metoprolol succinate (TOPROL-XL) 50 MG 24 hr tablet, Take 50 mg by mouth daily. Take with or immediately following a meal., Disp: , Rfl:  .  nitroGLYCERIN (NITROSTAT) 0.4 MG SL tablet, Place 1 tablet (0.4 mg total) under the tongue every 5  (five) minutes x 3 doses as needed for chest pain., Disp: 25 tablet, Rfl: 0 .  pantoprazole (PROTONIX) 40 MG tablet, Take 40 mg by mouth. , Disp: , Rfl:  .  sitaGLIPtin (JANUVIA) 50 MG tablet, Take 50 mg by mouth daily., Disp: , Rfl:  .  ticagrelor (BRILINTA) 90 MG TABS tablet, Take 1 tablet (90 mg total) by mouth 2 (two) times daily., Disp: 30 tablet, Rfl: 0 .  traZODone (DESYREL) 50 MG tablet, Take 50 mg by mouth at bedtime., Disp: , Rfl: 5 .  VOLTAREN 1 % GEL, Apply 2 g topically as needed (pain in shoulder). , Disp: , Rfl: 2  Past Medical History: Past Medical History:  Diagnosis Date  . Asthma   . Coronary artery disease   . Depression   . Diabetes mellitus without complication ([name])   . GERD (gastroesophageal reflux disease)   . History of hiatal hernia   . Hypertension   . Pain    BACK  . Stroke ([name])    x3  . Thyroid cyst     Tobacco Use: Social History   Tobacco Use  Smoking Status Former Smoker  . Last attempt to quit: 09/13/1998  . Years since quitting: 20.1  Smokeless Tobacco Never Used    Labs: Recent Review Scientist, physiological    Labs for ITP Cardiac and Pulmonary Rehab Latest Ref Rng & Units 10/23/2017 10/23/2017  10/23/2017 10/23/2017 10/24/2017   Hemoglobin A1c 4.8 - 5.6 % 7.3(H) - - - -   PHART 7.350 - 7.450 - 7.359 7.402 - -   PCO2ART 32.0 - 48.0 mmHg - 44.0 41.4 - -   HCO3 20.0 - 28.0 mmol/L - 25.1 26.0 - -   TCO2 22 - 32 mmol/L - _0 ACIDBASEDEF 0.0 - 2.0 mmol/L - 1.0 - - -   O2SAT % - 99.0 99.0 - -       Exercise Target Goals: Exercise Program Goal: Individual exercise prescription set using results from initial 6 min walk test and THRR while considering  patient's activity barriers and safety.   Exercise Prescription Goal: Initial exercise prescription builds to 30-45 minutes a day of aerobic activity, 2-3 days per week.  Home exercise guidelines will be given to patient during program as part of exercise prescription that the participant  will acknowledge.  Activity Barriers & Risk Stratification:   6 Minute Walk: 6 Minute Walk    Row Name 10/11/18 1351         6 Minute Walk   Phase  Initial     Distance  700 feet     Walk Time  5.75 minutes     # of Rest Breaks  1     MPH  1.32     METS  2.72     RPE  12     Perceived Dyspnea   1     VO2 Peak  9.5     Symptoms  Yes (comment)     Comments  foot drag when she fatigues     Resting HR  72 bpm     Resting BP  148/82     Resting Oxygen Saturation   97 %     Exercise Oxygen Saturation  during 6 min walk  98 %     Max Ex. HR  95 bpm     Max Ex. BP  150/78     2 Minute Post BP  130/78        Oxygen Initial Assessment:   Oxygen Re-Evaluation:   Oxygen Discharge (Final Oxygen Re-Evaluation):   Initial Exercise Prescription: Initial Exercise Prescription - 10/11/18 1300      Date of Initial Exercise RX and Referring Provider   Date  10/11/18    Referring Provider  Kayla Malone      NuStep   Level  1    SPM  80    Minutes  15    METs  2.7      Biostep-RELP   Level  2    SPM  50    Minutes  15    METs  2      Track   Laps  25    Minutes  15    METs  2.15      Prescription Details   Frequency (times per week)  3    Duration  Progress to 45 minutes of aerobic exercise without signs/symptoms of physical distress      Intensity   THRR 40-80% of Max Heartrate  108-144    Ratings of Perceived Exertion  11-13    Perceived Dyspnea  0-4      Resistance Training   Training Prescription  Yes    Weight  3 lb    Reps  10-15       Perform Capillary Blood Glucose checks as needed.  Exercise Prescription Changes: Exercise Prescription  Changes    Row Name 10/11/18 1400             Response to Exercise   Blood Pressure (Admit)  148/82       Blood Pressure (Exercise)  150/78       Blood Pressure (Exit)  130/78       Heart Rate (Admit)  72 bpm       Heart Rate (Exercise)  95 bpm       Heart Rate (Exit)  63 bpm       Rating of Perceived  Exertion (Exercise)  12       Perceived Dyspnea (Exercise)  1       Symptoms  foot drag          Exercise Comments:   Exercise Goals and Review: Exercise Goals    Row Name 10/11/18 1350             Exercise Goals   Increase Physical Activity  Yes       Intervention  Provide advice, education, support and counseling about physical activity/exercise needs.;Develop an individualized exercise prescription for aerobic and resistive training based on initial evaluation findings, risk stratification, comorbidities and participant's personal goals.       Expected Outcomes  Short Term: Attend rehab on a regular basis to increase amount of physical activity.;Long Term: Add in home exercise to make exercise part of routine and to increase amount of physical activity.;Long Term: Exercising regularly at least 3-5 days a week.       Increase Strength and Stamina  Yes       Intervention  Provide advice, education, support and counseling about physical activity/exercise needs.;Develop an individualized exercise prescription for aerobic and resistive training based on initial evaluation findings, risk stratification, comorbidities and participant's personal goals.       Expected Outcomes  Short Term: Increase workloads from initial exercise prescription for resistance, speed, and METs.;Short Term: Perform resistance training exercises routinely during rehab and add in resistance training at home;Long Term: Improve cardiorespiratory fitness, muscular endurance and strength as measured by increased METs and functional capacity (6MWT)       Able to understand and use rate of perceived exertion (RPE) scale  Yes       Intervention  Provide education and explanation on how to use RPE scale       Expected Outcomes  Short Term: Able to use RPE daily in rehab to express subjective intensity level;Long Term:  Able to use RPE to guide intensity level when exercising independently       Knowledge and understanding of  Target Heart Rate Range (THRR)  Yes       Intervention  Provide education and explanation of THRR including how the numbers were predicted and where they are located for reference       Expected Outcomes  Short Term: Able to state/look up THRR;Short Term: Able to use daily as guideline for intensity in rehab;Long Term: Able to use THRR to govern intensity when exercising independently       Able to check pulse independently  Yes       Intervention  Provide education and demonstration on how to check pulse in carotid and radial arteries.;Review the importance of being able to check your own pulse for safety during independent exercise       Expected Outcomes  Short Term: Able to explain why pulse checking is important during independent exercise;Long Term: Able to check pulse independently and accurately  Understanding of Exercise Prescription  Yes       Intervention  Provide education, explanation, and written materials on patient's individual exercise prescription       Expected Outcomes  Short Term: Able to explain program exercise prescription;Long Term: Able to explain home exercise prescription to exercise independently          Exercise Goals Re-Evaluation :   Discharge Exercise Prescription (Final Exercise Prescription Changes): Exercise Prescription Changes - 10/11/18 1400      Response to Exercise   Blood Pressure (Admit)  148/82    Blood Pressure (Exercise)  150/78    Blood Pressure (Exit)  130/78    Heart Rate (Admit)  72 bpm    Heart Rate (Exercise)  95 bpm    Heart Rate (Exit)  63 bpm    Rating of Perceived Exertion (Exercise)  12    Perceived Dyspnea (Exercise)  1    Symptoms  foot drag       Nutrition:  Target Goals: Understanding of nutrition guidelines, daily intake of sodium <152m, cholesterol <203m calories 30% from fat and 7% or less from saturated fats, daily to have 5 or more servings of fruits and vegetables.  Biometrics: Pre Biometrics - 10/11/18  1350      Pre Biometrics   Height  5' 4.25" (1.632 m)    Weight  146 lb 14.4 oz (66.6 kg)    Waist Circumference  32 inches    Hip Circumference  36.5 inches    Waist to Hip Ratio  0.88 %    BMI (Calculated)  25.02        Nutrition Therapy Plan and Nutrition Goals: Nutrition Therapy & Goals - 10/11/18 1409      Intervention Plan   Intervention  Prescribe, educate and counsel regarding individualized specific dietary modifications aiming towards targeted core components such as weight, hypertension, lipid management, diabetes, heart failure and other comorbidities.;Nutrition handout(s) given to patient.    Expected Outcomes  Short Term Goal: Understand basic principles of dietary content, such as calories, fat, sodium, cholesterol and nutrients.;Short Term Goal: A plan has been developed with personal nutrition goals set during dietitian appointment.;Long Term Goal: Adherence to prescribed nutrition plan.       Nutrition Assessments: Nutrition Assessments - 10/11/18 1409      MEDFICTS Scores   Pre Score  0   no appetite. barely eats anything through out the day.       Nutrition Goals Re-Evaluation:   Nutrition Goals Discharge (Final Nutrition Goals Re-Evaluation):   Psychosocial: Target Goals: Acknowledge presence or absence of significant depression and/or stress, maximize coping skills, provide positive support system. Participant is able to verbalize types and ability to use techniques and skills needed for reducing stress and depression.   Initial Review & Psychosocial Screening: Initial Psych Review & Screening - 10/11/18 1401      Initial Review   Current issues with  History of Depression;Current Stress Concerns    Source of Stress Concerns  Poor Coping Skills;Unable to participate in former interests or hobbies;Chronic Illness;Financial    Comments  [place] hesistant to do much outside of her home. It is hard for her to get going during the day. She still has very  little appetite and very low energy.       Family Dynamics   Good Support System?  Yes   she relies on her sister, but she is not in good health either. She did not mention any of her  children this time.      Screening Interventions   Interventions  Encouraged to exercise;Program counselor consult;To provide support and resources with identified psychosocial needs    Expected Outcomes  Short Term goal: Utilizing psychosocial counselor, staff and physician to assist with identification of specific Stressors or current issues interfering with healing process. Setting desired goal for each stressor or current issue identified.;Long Term Goal: Stressors or current issues are controlled or eliminated.;Short Term goal: Identification and review with participant of any Quality of Life or Depression concerns found by scoring the questionnaire.;Long Term goal: The participant improves quality of Life and PHQ9 Scores as seen by post scores and/or verbalization of changes       Quality of Life Scores:  Quality of Life - 10/11/18 1403      Quality of Life   Select  Quality of Life      Quality of Life Scores   Health/Function Pre  16.57 %    Socioeconomic Pre  23.5 %    Psych/Spiritual Pre  22.29 %    Family Pre  24.38 %    GLOBAL Pre  20.09 %      Scores of 19 and below usually indicate a poorer quality of life in these areas.  A difference of  2-3 points is a clinically meaningful difference.  A difference of 2-3 points in the total score of the Quality of Life Index has been associated with significant improvement in overall quality of life, self-image, physical symptoms, and general health in studies assessing change in quality of life.  PHQ-9: Recent Review Flowsheet Data    Depression screen [facility] 2/9 10/11/2018 05/09/2018 12/28/2017   Decreased Interest _0 Down, Depressed, Hopeless _1 PHQ - 2 Score _2 Altered sleeping 0 2 2   Tired, decreased energy _3 Change in  appetite _4 Feeling bad or failure about yourself  2 3 0   Trouble concentrating _5 Moving slowly or fidgety/restless 0 1 0   Suicidal thoughts 0 0 0   PHQ-9 Score _6 Difficult doing work/chores Very difficult Somewhat difficult Not difficult at all     Interpretation of Total Score  Total Score Depression Severity:  1-4 = Minimal depression, 5-9 = Mild depression, 10-14 = Moderate depression, 15-19 = Moderately severe depression, 20-27 = Severe depression   Psychosocial Evaluation and Intervention:   Psychosocial Re-Evaluation:   Psychosocial Discharge (Final Psychosocial Re-Evaluation):   Vocational Rehabilitation: Provide vocational rehab assistance to qualifying candidates.   Vocational Rehab Evaluation & Intervention: Vocational Rehab - 10/11/18 1400      Initial Vocational Rehab Evaluation & Intervention   Assessment shows need for Vocational Rehabilitation  No       Education: Education Goals: Education classes will be provided on a variety of topics geared toward better understanding of heart health and risk factor modification. Participant will state understanding/return demonstration of topics presented as noted by education test scores.  Learning Barriers/Preferences: Learning Barriers/Preferences - 10/11/18 1348      Learning Barriers/Preferences   Learning Barriers  Exercise Concerns    Learning Preferences  Audio;Video       Education Topics:  AED/CPR: - Group verbal and written instruction with the use of models to demonstrate the basic use of the AED with the basic ABC's of resuscitation.   Cardiac Rehab from 05/09/2018 in [facility]  Cardiac and Pulmonary Rehab  Date  05/02/18  Educator  CE  Instruction Review Code  1- Verbalizes Understanding      General Nutrition Guidelines/Fats and Fiber: -Group instruction provided by verbal, written material, models and posters to present the general guidelines for heart healthy nutrition.  Gives an explanation and review of dietary fats and fiber.   Controlling Sodium/Reading Food Labels: -Group verbal and written material supporting the discussion of sodium use in heart healthy nutrition. Review and explanation with models, verbal and written materials for utilization of the food label.   Exercise Physiology & General Exercise Guidelines: - Group verbal and written instruction with models to review the exercise physiology of the cardiovascular system and associated critical values. Provides general exercise guidelines with specific guidelines to those with heart or lung disease.    Cardiac Rehab from 05/09/2018 in [facility] Cardiac and Pulmonary Rehab  Date  03/21/18  Educator  [facility]  Instruction Review Code  1- Verbalizes Understanding      Aerobic Exercise & Resistance Training: - Gives group verbal and written instruction on the various components of exercise. Focuses on aerobic and resistive training programs and the benefits of this training and how to safely progress through these programs..   Cardiac Rehab from 05/09/2018 in [facility] Cardiac and Pulmonary Rehab  Date  03/26/18  Educator  [facility]  Instruction Review Code  1- Geologist, engineering, Balance, Mind/Body Relaxation: Provides group verbal/written instruction on the benefits of flexibility and balance training, including mind/body exercise modes such as yoga, pilates and tai chi.  Demonstration and skill practice provided.   Stress and Anxiety: - Provides group verbal and written instruction about the health risks of elevated stress and causes of high stress.  Discuss the correlation between heart/lung disease and anxiety and treatment options. Review healthy ways to manage with stress and anxiety.   Cardiac Rehab from 05/09/2018 in [facility] Cardiac and Pulmonary Rehab  Date  02/13/18  Educator  [facility]  Instruction Review Code  1- Verbalizes Understanding      Depression: - Provides group verbal and  written instruction on the correlation between heart/lung disease and depressed mood, treatment options, and the stigmas associated with seeking treatment.   Cardiac Rehab from 05/09/2018 in [facility] Cardiac and Pulmonary Rehab  Date  03/28/18  Educator  [facility]  Instruction Review Code  1- Verbalizes Understanding      Anatomy & Physiology of the Heart: - Group verbal and written instruction and models provide basic cardiac anatomy and physiology, with the coronary electrical and arterial systems. Review of Valvular disease and Heart Failure   Cardiac Rehab from 05/09/2018 in [facility] Cardiac and Pulmonary Rehab  Date  04/09/18  Educator  CE  Instruction Review Code  1- Verbalizes Understanding      Cardiac Procedures: - Group verbal and written instruction to review commonly prescribed medications for heart disease. Reviews the medication, class of the drug, and side effects. Includes the steps to properly store meds and maintain the prescription regimen. (beta blockers and nitrates)   Cardiac Medications I: - Group verbal and written instruction to review commonly prescribed medications for heart disease. Reviews the medication, class of the drug, and side effects. Includes the steps to properly store meds and maintain the prescription regimen.   Cardiac Rehab from 05/09/2018 in [facility] Cardiac and Pulmonary Rehab  Date  04/16/18  Educator  CE  Instruction Review Code  1- Verbalizes Understanding      Cardiac  Medications II: -Group verbal and written instruction to review commonly prescribed medications for heart disease. Reviews the medication, class of the drug, and side effects. (all other drug classes)   Cardiac Rehab from 05/09/2018 in [facility] Cardiac and Pulmonary Rehab  Date  02/15/18  Educator  [facility]  Instruction Review Code  1- Verbalizes Understanding       Go Sex-Intimacy & Heart Disease, Get SMART - Goal Setting: - Group verbal and written instruction through game format to discuss  heart disease and the return to sexual intimacy. Provides group verbal and written material to discuss and apply goal setting through the application of the S.M.A.R.T. Method.   Other Matters of the Heart: - Provides group verbal, written materials and models to describe Stable Angina and Peripheral Artery. Includes description of the disease process and treatment options available to the cardiac patient.   Cardiac Rehab from 05/09/2018 in [facility] Cardiac and Pulmonary Rehab  Date  04/09/18  Educator  CE  Instruction Review Code  1- Verbalizes Understanding      Exercise & Equipment Safety: - Individual verbal instruction and demonstration of equipment use and safety with use of the equipment.   Cardiac Rehab from 10/11/2018 in [facility] Cardiac and Pulmonary Rehab  Date  10/11/18  Educator  [facility]  Instruction Review Code  1- Verbalizes Understanding      Infection Prevention: - Provides verbal and written material to individual with discussion of infection control including proper hand washing and proper equipment cleaning during exercise session.   Cardiac Rehab from 10/11/2018 in [facility] Cardiac and Pulmonary Rehab  Date  10/11/18  Educator  [facility]  Instruction Review Code  1- Verbalizes Understanding      Falls Prevention: - Provides verbal and written material to individual with discussion of falls prevention and safety.   Cardiac Rehab from 10/11/2018 in [facility] Cardiac and Pulmonary Rehab  Date  10/11/18  Educator  [facility]  Instruction Review Code  1- Verbalizes Understanding      Diabetes: - Individual verbal and written instruction to review signs/symptoms of diabetes, desired ranges of glucose level fasting, after meals and with exercise. Acknowledge that pre and post exercise glucose checks will be done for 3 sessions at entry of program.   Cardiac Rehab from 10/11/2018 in [facility] Cardiac and Pulmonary Rehab  Date  10/11/18  Educator  [facility]  Instruction Review Code  1- Verbalizes Understanding       Know Your Numbers and Risk Factors: -Group verbal and written instruction about important numbers in your health.  Discussion of what are risk factors and how they play a role in the disease process.  Review of Cholesterol, Blood Pressure, Diabetes, and BMI and the role they play in your overall health.   Cardiac Rehab from 05/09/2018 in [facility] Cardiac and Pulmonary Rehab  Date  02/15/18  Educator  [facility]  Instruction Review Code  1- Verbalizes Understanding      Sleep Hygiene: -Provides group verbal and written instruction about how sleep can affect your health.  Define sleep hygiene, discuss sleep cycles and impact of sleep habits. Review good sleep hygiene tips.    Cardiac Rehab from 05/09/2018 in [facility] Cardiac and Pulmonary Rehab  Date  04/25/18  Educator  [facility]  Instruction Review Code  1- Verbalizes Understanding      Other: -Provides group and verbal instruction on various topics (see comments)   Knowledge Questionnaire Score: Knowledge Questionnaire Score - 10/11/18 1359      Knowledge Questionnaire Score  Pre Score  21/26   correct answers reviewed with [facility]. Focus on nutrition and exercise.       Core Components/Risk Factors/Patient Goals at Admission: Personal Goals and Risk Factors at Admission - 10/11/18 1347      Core Components/Risk Factors/Patient Goals on Admission    Weight Management  Yes;Weight Maintenance    Intervention  Weight Management: Develop a combined nutrition and exercise program designed to reach desired caloric intake, while maintaining appropriate intake of nutrient and fiber, sodium and fats, and appropriate energy expenditure required for the weight goal.;Weight Management: Provide education and appropriate resources to help participant work on and attain dietary goals.    Expected Outcomes  Short Term: Continue to assess and modify interventions until short term weight is achieved;Long Term: Adherence to nutrition and physical activity/exercise  program aimed toward attainment of established weight goal;Weight Maintenance: Understanding of the daily nutrition guidelines, which includes 25-35% calories from fat, 7% or less cal from saturated fats, less than 239m cholesterol, less than 1.5gm of sodium, & 5 or more servings of fruits and vegetables daily;Understanding recommendations for meals to include 15-35% energy as protein, 25-35% energy from fat, 35-60% energy from carbohydrates, less than 2057mof dietary cholesterol, 20-35 gm of total fiber daily;Understanding of distribution of calorie intake throughout the day with the consumption of 4-5 meals/snacks    Diabetes  Yes    Intervention  Provide education about signs/symptoms and action to take for hypo/hyperglycemia.;Provide education about proper nutrition, including hydration, and aerobic/resistive exercise prescription along with prescribed medications to achieve blood glucose in normal ranges: Fasting glucose 65-99 mg/dL    Expected Outcomes  Long Term: Attainment of HbA1C < 7%.;Short Term: Participant verbalizes understanding of the signs/symptoms and immediate care of hyper/hypoglycemia, proper foot care and importance of medication, aerobic/resistive exercise and nutrition plan for blood glucose control.    Hypertension  Yes    Intervention  Provide education on lifestyle modifcations including regular physical activity/exercise, weight management, moderate sodium restriction and increased consumption of fresh fruit, vegetables, and low fat dairy, alcohol moderation, and smoking cessation.;Monitor prescription use compliance.    Expected Outcomes  Short Term: Continued assessment and intervention until BP is < 140/9064mG in hypertensive participants. < 130/56m71m in hypertensive participants with diabetes, heart failure or chronic kidney disease.;Long Term: Maintenance of blood pressure at goal levels.    Lipids  Yes    Intervention  Provide education and support for participant on  nutrition & aerobic/resistive exercise along with prescribed medications to achieve LDL <70mg58mL >40mg.48mExpected Outcomes  Short Term: Participant states understanding of desired cholesterol values and is compliant with medications prescribed. Participant is following exercise prescription and nutrition guidelines.;Long Term: Cholesterol controlled with medications as prescribed, with individualized exercise RX and with personalized nutrition plan. Value goals: LDL < 70mg, 27m> 40 mg.       Core Components/Risk Factors/Patient Goals Review:    Core Components/Risk Factors/Patient Goals at Discharge (Final Review):    ITP Comments: ITP Comments    Row Name 10/11/18 1339 10/17/18 0611         ITP Comments  Med Review completed. Initial ITP created. Diagnosis can be found in [facility] 10/14  30 day review. Continue with ITP unless direccted changes per Medical Director Chart Review.         Comments:

## 2018-10-24 ENCOUNTER — Encounter: Payer: Medicare Other | Admitting: *Deleted

## 2018-10-24 ENCOUNTER — Telehealth: Payer: Self-pay

## 2018-10-24 DIAGNOSIS — Z955 Presence of coronary angioplasty implant and graft: Secondary | ICD-10-CM | POA: Diagnosis not present

## 2018-10-24 DIAGNOSIS — I214 Non-ST elevation (NSTEMI) myocardial infarction: Secondary | ICD-10-CM

## 2018-10-24 LAB — GLUCOSE, CAPILLARY
Glucose-Capillary: 142 mg/dL — ABNORMAL HIGH (ref 70–99)
Glucose-Capillary: 68 mg/dL — ABNORMAL LOW (ref 70–99)
Glucose-Capillary: 73 mg/dL (ref 70–99)

## 2018-10-24 NOTE — Progress Notes (Signed)
Daily Session Note  Patient Details  Name: Kayla Malone MRN: [id number] Date of Birth: [birth year] Referring Provider:     Cardiac Rehab from 10/11/2018 in [facility] Cardiac and Pul[facility] Rehab  Referring Provider  [name] Date: 10/24/2018  Check In: Session Check In - 10/24/18 1619      Check-In   Supervising physician immediately available to respond to emergencies  See telemetry face sheet for immediately available MD    Location  [facility]-Cardiac & Pulmonary Rehab    Staff Present  Gerlene Burdock, RN, BSN;Meredith Sherryll Burger, RN Vickki Hearing, BA, ACSM CEP, Exercise Physiologist    Medication changes reported      No    Fall or balance concerns reported     No    Tobacco Cessation  No Change    Warm-up and Cool-down  Performed on first and last piece of equipment    Resistance Training Performed  Yes    VAD Patient?  No    PAD/SET Patient?  No      Pain Assessment   Currently in Pain?  No/denies          Social History   Tobacco Use  Smoking Status Former Smoker  . Last attempt to quit: 09/13/1998  . Years since quitting: 20.1  Smokeless Tobacco Never Used    Goals Met:  Proper associated with RPD/PD & O2 Sat Exercise tolerated well No report of cardiac concerns or symptoms Strength training completed today  Goals Unmet:  Not Applicable  Comments:     Dr. Emily Filbert is Medical Director for [name].

## 2018-10-24 NOTE — Telephone Encounter (Signed)
Kayla Malone said she plans to return to class today.

## 2018-10-25 ENCOUNTER — Encounter: Payer: Medicare Other | Admitting: *Deleted

## 2018-10-25 DIAGNOSIS — Z955 Presence of coronary angioplasty implant and graft: Secondary | ICD-10-CM

## 2018-10-25 DIAGNOSIS — Z951 Presence of aortocoronary bypass graft: Secondary | ICD-10-CM

## 2018-10-25 DIAGNOSIS — I214 Non-ST elevation (NSTEMI) myocardial infarction: Secondary | ICD-10-CM

## 2018-10-25 LAB — GLUCOSE, CAPILLARY
GLUCOSE-CAPILLARY: 112 mg/dL — AB (ref 70–99)
Glucose-Capillary: 148 mg/dL — ABNORMAL HIGH (ref 70–99)

## 2018-10-25 NOTE — Progress Notes (Signed)
Daily Session Note  Patient Details  Name: Kayla Malone MRN: [id number] Date of Birth: [birth year] Referring Provider:     Cardiac Rehab from 10/11/2018 in [facility] Cardiac and Pulmonary Rehab  Referring Provider  Kayla Malone      Encounter Date: 10/25/2018  Check In: Session Check In - 10/25/18 1638      Check-In   Supervising physician immediately available to respond to emergencies  See telemetry face sheet for immediately available ER MD    Location  [facility]-Cardiac & Pulmonary Rehab    Staff Present  Kayla Malone, BS, ACSM CEP, Exercise Physiologist;Kayla Alcus Dad, RN BSN    Medication changes reported      No    Fall or balance concerns reported     No    Tobacco Cessation  No Change    Warm-up and Cool-down  Performed on first and last piece of equipment    Resistance Training Performed  Yes    VAD Patient?  No    PAD/SET Patient?  No      Pain Assessment   Currently in Pain?  No/denies    Multiple Pain Sites  No          Social History   Tobacco Use  Smoking Status Former Smoker  . Last attempt to quit: 09/13/1998  . Years since quitting: 20.1  Smokeless Tobacco Never Used    Goals Met:  Independence with exercise equipment Exercise tolerated well No report of cardiac concerns or symptoms Strength training completed today  Goals Unmet:  Not Applicable  Comments: Pt able to follow exercise prescription today without complaint.  Will continue to monitor for progression.    Dr. Emily Filbert is Medical Director for [name].

## 2018-10-29 ENCOUNTER — Encounter: Payer: Medicare Other | Admitting: *Deleted

## 2018-10-29 DIAGNOSIS — I214 Non-ST elevation (NSTEMI) myocardial infarction: Secondary | ICD-10-CM

## 2018-10-29 DIAGNOSIS — Z955 Presence of coronary angioplasty implant and graft: Secondary | ICD-10-CM | POA: Diagnosis not present

## 2018-10-29 LAB — GLUCOSE, CAPILLARY
GLUCOSE-CAPILLARY: 75 mg/dL (ref 70–99)
GLUCOSE-CAPILLARY: 113 mg/dL — AB (ref 70–99)

## 2018-10-29 NOTE — Progress Notes (Signed)
Incomplete Session Note  Patient Details  Name: Kayla Malone MRN: [id number] Date of Birth: [birth year] Referring Provider:     Cardiac Rehab from 10/11/2018 in [facility] Cardiac and Pulmonary Rehab  Referring Provider  [name] did not complete her rehab session.  Since upon arrival [facility] c/o 7/10 chest pressure all day long today (blood pressure was 130/70. She did not take any NTG so we gave her 0.4mg  sl NTG at 4:12pm  which provided relief (chest pressure was 1/10 at 4:20pm. With bp 142/80 pulse 63. Her finger stick blood sugar was 75 so given 4 glucose tablets. Blood sugar came up to 113 and then we had her eat peanut butter crackers. AT 4:25pm she was pain free. [name] instructed [name] how to take NTG sl every 5 minutes and if no relief after 2nd NTG to call 911. [name] has a MD appt this Wednesday. [name] went home and did not exercise in Cardiac REhab  but instructed if she has further chest pain to take sl NTG and if no relief after she take it to call 911.

## 2018-10-29 NOTE — Progress Notes (Signed)
Cardiac Individual Treatment Plan  Patient Details  Name: Kayla Malone MRN: [id number] Date of Birth: 10/23/1961 Referring Provider:     Cardiac Rehab from 10/11/2018 in [facility] Cardiac and Pulmonary Rehab  Referring Provider  Humphrey Rolls      Initial Encounter Date:    Cardiac Rehab from 10/11/2018 in [facility] Cardiac and Pulmonary Rehab  Date  10/11/18      Visit Diagnosis: NSTEMI (non-ST elevated myocardial infarction) [name])  Status post coronary artery stent placement  Patient's Home Medications on Admission:  Current Outpatient Medications:  .  acetaminophen (TYLENOL) 500 MG tablet, Malone 1,000 mg by mouth every 6 (six) hours as needed for mild pain or headache., Disp: , Rfl:  .  albuterol (ACCUNEB) 0.63 MG/3ML nebulizer solution, Malone 1 ampule by nebulization every 6 (six) hours as needed for wheezing., Disp: , Rfl:  .  albuterol (PROVENTIL HFA;VENTOLIN HFA) 108 (90 Base) MCG/ACT inhaler, Inhale 2 puffs into the lungs every 6 (six) hours as needed for wheezing or shortness of breath. , Disp: , Rfl:  .  aspirin EC 81 MG tablet, Malone 81 mg by mouth daily., Disp: , Rfl:  .  atorvastatin (LIPITOR) 40 MG tablet, Malone 80 mg by mouth daily. , Disp: , Rfl:  .  fluticasone (FLONASE) 50 MCG/ACT nasal spray, Place 2 sprays into both nostrils daily., Disp: , Rfl:  .  Fluticasone-Salmeterol (ADVAIR DISKUS) 500-50 MCG/DOSE AEPB, Inhale 1 puff into the lungs daily as needed (SOB). , Disp: , Rfl:  .  losartan (COZAAR) 50 MG tablet, Malone 50 mg by mouth daily., Disp: , Rfl: 2 .  metFORMIN (GLUCOPHAGE) 500 MG tablet, Malone 1,000 mg by mouth every morning. , Disp: , Rfl: 5 .  metFORMIN (GLUCOPHAGE) 500 MG tablet, Malone 500 mg by mouth every evening., Disp: , Rfl:  .  metoprolol succinate (TOPROL-XL) 50 MG 24 hr tablet, Malone 50 mg by mouth daily. Malone with or immediately following a meal., Disp: , Rfl:  .  nitroGLYCERIN (NITROSTAT) 0.4 MG SL tablet, Place 1 tablet (0.4 mg total) under the tongue every 5  (five) minutes x 3 doses as needed for chest pain., Disp: 25 tablet, Rfl: 0 .  pantoprazole (PROTONIX) 40 MG tablet, Malone 40 mg by mouth. , Disp: , Rfl:  .  sitaGLIPtin (JANUVIA) 50 MG tablet, Malone 50 mg by mouth daily., Disp: , Rfl:  .  ticagrelor (BRILINTA) 90 MG TABS tablet, Malone 1 tablet (90 mg total) by mouth 2 (two) times daily., Disp: 30 tablet, Rfl: 0 .  traZODone (DESYREL) 50 MG tablet, Malone 50 mg by mouth at bedtime., Disp: , Rfl: 5 .  VOLTAREN 1 % GEL, Apply 2 g topically as needed (pain in shoulder). , Disp: , Rfl: 2  Past Medical History: Past Medical History:  Diagnosis Date  . Asthma   . Coronary artery disease   . Depression   . Diabetes mellitus without complication ([name])   . GERD (gastroesophageal reflux disease)   . History of hiatal hernia   . Hypertension   . Pain    BACK  . Stroke ([name])    x3  . Thyroid cyst     Tobacco Use: Social History   Tobacco Use  Smoking Status Former Smoker  . Last attempt to quit: 09/13/1998  . Years since quitting: 20.1  Smokeless Tobacco Never Used    Labs: Recent Review Scientist, physiological    Labs for ITP Cardiac and Pulmonary Rehab Latest Ref Rng & Units 10/23/2017 10/23/2017  10/23/2017 10/23/2017 10/24/2017   Hemoglobin A1c 4.8 - 5.6 % 7.3(H) - - - -   PHART 7.350 - 7.450 - 7.359 7.402 - -   PCO2ART 32.0 - 48.0 mmHg - 44.0 41.4 - -   HCO3 20.0 - 28.0 mmol/L - 25.1 26.0 - -   TCO2 22 - 32 mmol/L - _0 ACIDBASEDEF 0.0 - 2.0 mmol/L - 1.0 - - -   O2SAT % - 99.0 99.0 - -       Exercise Target Goals: Exercise Program Goal: Individual exercise prescription set using results from initial 6 min walk test and THRR while considering  patient's activity barriers and safety.   Exercise Prescription Goal: Initial exercise prescription builds to 30-45 minutes a day of aerobic activity, 2-3 days per week.  Home exercise guidelines will be given to patient during program as part of exercise prescription that the participant  will acknowledge.  Activity Barriers & Risk Stratification:   6 Minute Walk: 6 Minute Walk    Row Name 10/11/18 1351         6 Minute Walk   Phase  Initial     Distance  700 feet     Walk Time  5.75 minutes     # of Rest Breaks  1     MPH  1.32     METS  2.72     RPE  12     Perceived Dyspnea   1     VO2 Peak  9.5     Symptoms  Yes (comment)     Comments  foot drag when she fatigues     Resting HR  72 bpm     Resting BP  148/82     Resting Oxygen Saturation   97 %     Exercise Oxygen Saturation  during 6 min walk  98 %     Max Ex. HR  95 bpm     Max Ex. BP  150/78     2 Minute Post BP  130/78        Oxygen Initial Assessment:   Oxygen Re-Evaluation:   Oxygen Discharge (Final Oxygen Re-Evaluation):   Initial Exercise Prescription: Initial Exercise Prescription - 10/11/18 1300      Date of Initial Exercise RX and Referring Provider   Date  10/11/18    Referring Provider  Humphrey Rolls      NuStep   Level  1    SPM  80    Minutes  15    METs  2.7      Biostep-RELP   Level  2    SPM  50    Minutes  15    METs  2      Track   Laps  25    Minutes  15    METs  2.15      Prescription Details   Frequency (times per week)  3    Duration  Progress to 45 minutes of aerobic exercise without signs/symptoms of physical distress      Intensity   THRR 40-80% of Max Heartrate  108-144    Ratings of Perceived Exertion  11-13    Perceived Dyspnea  0-4      Resistance Training   Training Prescription  Yes    Weight  3 lb    Reps  10-15       Perform Capillary Blood Glucose checks as needed.  Exercise Prescription Changes: Exercise Prescription  Changes    Row Name 10/11/18 1400 10/24/18 1100           Response to Exercise   Blood Pressure (Admit)  148/82  140/80      Blood Pressure (Exercise)  150/78  140/80      Blood Pressure (Exit)  130/78  124/74      Heart Rate (Admit)  72 bpm  75 bpm      Heart Rate (Exercise)  95 bpm  96 bpm      Heart Rate  (Exit)  63 bpm  74 bpm      Rating of Perceived Exertion (Exercise)  12  14      Perceived Dyspnea (Exercise)  1  -      Symptoms  foot drag  -      Comments  -  first session      Duration  -  Progress to 45 minutes of aerobic exercise without signs/symptoms of physical distress      Intensity  -  THRR unchanged        Progression   Progression  -  Continue to progress workloads to maintain intensity without signs/symptoms of physical distress.      Average METs  -  2        Resistance Training   Training Prescription  -  Yes      Weight  -  3 lb      Reps  -  10-15        Interval Training   Interval Training  -  No        Biostep-RELP   Level  -  2      SPM  -  50      Minutes  -  15      METs  -  2         Exercise Comments:   Exercise Goals and Review: Exercise Goals    Row Name 10/11/18 1350             Exercise Goals   Increase Physical Activity  Yes       Intervention  Provide advice, education, support and counseling about physical activity/exercise needs.;Develop an individualized exercise prescription for aerobic and resistive training based on initial evaluation findings, risk stratification, comorbidities and participant's personal goals.       Expected Outcomes  Short Term: Attend rehab on a regular basis to increase amount of physical activity.;Long Term: Add in home exercise to make exercise part of routine and to increase amount of physical activity.;Long Term: Exercising regularly at least 3-5 days a week.       Increase Strength and Stamina  Yes       Intervention  Provide advice, education, support and counseling about physical activity/exercise needs.;Develop an individualized exercise prescription for aerobic and resistive training based on initial evaluation findings, risk stratification, comorbidities and participant's personal goals.       Expected Outcomes  Short Term: Increase workloads from initial exercise prescription for resistance, speed,  and METs.;Short Term: Perform resistance training exercises routinely during rehab and add in resistance training at home;Long Term: Improve cardiorespiratory fitness, muscular endurance and strength as measured by increased METs and functional capacity (6MWT)       Able to understand and use rate of perceived exertion (RPE) scale  Yes       Intervention  Provide education and explanation on how to use RPE scale  Expected Outcomes  Short Term: Able to use RPE daily in rehab to express subjective intensity level;Long Term:  Able to use RPE to guide intensity level when exercising independently       Knowledge and understanding of Target Heart Rate Range (THRR)  Yes       Intervention  Provide education and explanation of THRR including how the numbers were predicted and where they are located for reference       Expected Outcomes  Short Term: Able to state/look up THRR;Short Term: Able to use daily as guideline for intensity in rehab;Long Term: Able to use THRR to govern intensity when exercising independently       Able to check pulse independently  Yes       Intervention  Provide education and demonstration on how to check pulse in carotid and radial arteries.;Review the importance of being able to check your own pulse for safety during independent exercise       Expected Outcomes  Short Term: Able to explain why pulse checking is important during independent exercise;Long Term: Able to check pulse independently and accurately       Understanding of Exercise Prescription  Yes       Intervention  Provide education, explanation, and written materials on patient's individual exercise prescription       Expected Outcomes  Short Term: Able to explain program exercise prescription;Long Term: Able to explain home exercise prescription to exercise independently          Exercise Goals Re-Evaluation :   Discharge Exercise Prescription (Final Exercise Prescription Changes): Exercise Prescription  Changes - 10/24/18 1100      Response to Exercise   Blood Pressure (Admit)  140/80    Blood Pressure (Exercise)  140/80    Blood Pressure (Exit)  124/74    Heart Rate (Admit)  75 bpm    Heart Rate (Exercise)  96 bpm    Heart Rate (Exit)  74 bpm    Rating of Perceived Exertion (Exercise)  14    Comments  first session    Duration  Progress to 45 minutes of aerobic exercise without signs/symptoms of physical distress    Intensity  THRR unchanged      Progression   Progression  Continue to progress workloads to maintain intensity without signs/symptoms of physical distress.    Average METs  2      Resistance Training   Training Prescription  Yes    Weight  3 lb    Reps  10-15      Interval Training   Interval Training  No      Biostep-RELP   Level  2    SPM  50    Minutes  15    METs  2       Nutrition:  Target Goals: Understanding of nutrition guidelines, daily intake of sodium <1542m, cholesterol <206m calories 30% from fat and 7% or less from saturated fats, daily to have 5 or more servings of fruits and vegetables.  Biometrics: Pre Biometrics - 10/11/18 1350      Pre Biometrics   Height  5' 4.25" (1.632 m)    Weight  146 lb 14.4 oz (66.6 kg)    Waist Circumference  32 inches    Hip Circumference  36.5 inches    Waist to Hip Ratio  0.88 %    BMI (Calculated)  25.02        Nutrition Therapy Plan and Nutrition Goals: Nutrition Therapy &  Goals - 10/11/18 1409      Intervention Plan   Intervention  Prescribe, educate and counsel regarding individualized specific dietary modifications aiming towards targeted core components such as weight, hypertension, lipid management, diabetes, heart failure and other comorbidities.;Nutrition handout(s) given to patient.    Expected Outcomes  Short Term Goal: Understand basic principles of dietary content, such as calories, fat, sodium, cholesterol and nutrients.;Short Term Goal: A plan has been developed with personal  nutrition goals set during dietitian appointment.;Long Term Goal: Adherence to prescribed nutrition plan.       Nutrition Assessments: Nutrition Assessments - 10/11/18 1409      MEDFICTS Scores   Pre Score  0   no appetite. barely eats anything through out the day.       Nutrition Goals Re-Evaluation:   Nutrition Goals Discharge (Final Nutrition Goals Re-Evaluation):   Psychosocial: Target Goals: Acknowledge presence or absence of significant depression and/or stress, maximize coping skills, provide positive support system. Participant is able to verbalize types and ability to use techniques and skills needed for reducing stress and depression.   Initial Review & Psychosocial Screening: Initial Psych Review & Screening - 10/11/18 1401      Initial Review   Current issues with  History of Depression;Current Stress Concerns    Source of Stress Concerns  Poor Coping Skills;Unable to participate in former interests or hobbies;Chronic Illness;Financial    Comments  Kayla Malone is hesistant to do much outside of her home. It is hard for her to get going during the day. She still has very little appetite and very low energy.       Family Dynamics   Good Support System?  Yes   she relies on her sister, but she is not in good health either. She did not mention any of her children this time.      Screening Interventions   Interventions  Encouraged to exercise;Program counselor consult;To provide support and resources with identified psychosocial needs    Expected Outcomes  Short Term goal: Utilizing psychosocial counselor, staff and physician to assist with identification of specific Stressors or current issues interfering with healing process. Setting desired goal for each stressor or current issue identified.;Long Term Goal: Stressors or current issues are controlled or eliminated.;Short Term goal: Identification and review with participant of any Quality of Life or Depression concerns found by  scoring the questionnaire.;Long Term goal: The participant improves quality of Life and PHQ9 Scores as seen by post scores and/or verbalization of changes       Quality of Life Scores:  Quality of Life - 10/11/18 1403      Quality of Life   Select  Quality of Life      Quality of Life Scores   Health/Function Pre  16.57 %    Socioeconomic Pre  23.5 %    Psych/Spiritual Pre  22.29 %    Family Pre  24.38 %    GLOBAL Pre  20.09 %      Scores of 19 and below usually indicate a poorer quality of life in these areas.  A difference of  2-3 points is a clinically meaningful difference.  A difference of 2-3 points in the total score of the Quality of Life Index has been associated with significant improvement in overall quality of life, self-image, physical symptoms, and general health in studies assessing change in quality of life.  PHQ-9: Recent Review Flowsheet Data    Depression screen [facility] 2/9 10/11/2018 05/09/2018 12/28/2017   Decreased  Interest _0 Down, Depressed, Hopeless _1 PHQ - 2 Score _2 Altered sleeping 0 2 2   Tired, decreased energy _3 Change in appetite _4 Feeling bad or failure about yourself  2 3 0   Trouble concentrating _5 Moving slowly or fidgety/restless 0 1 0   Suicidal thoughts 0 0 0   PHQ-9 Score _6 Difficult doing work/chores Very difficult Somewhat difficult Not difficult at all     Interpretation of Total Score  Total Score Depression Severity:  1-4 = Minimal depression, 5-9 = Mild depression, 10-14 = Moderate depression, 15-19 = Moderately severe depression, 20-27 = Severe depression   Psychosocial Evaluation and Intervention: Psychosocial Evaluation - 10/17/18 1619      Psychosocial Evaluation & Interventions   Interventions  Stress management education;Relaxation education;Encouraged to exercise with the program and follow exercise prescription    Comments  Counselor met with Ms. Kayla Malone) today for her return to  Cardiac Rehab after 6 months - due to a mild heart attack and stent insertion.  Kayla Malone will be 57 years old this weekend and has a strong support system with (4) adult children and a significant other.  She reports sleeping "better" with the use of a prescription med but doesn't have much of an appetite.  She a history of depression and reports taking meds to help with this.  Her PHQ-9 scores are 17 indicating moderately severe symptoms of depression are present.  She has multiple stressors with her health; being disabled and finances.  Kayla Malone has goals to "get heart healthy" and get back to church - where she had just returned the day she had her mild heart attack.  Counselor will follow.      Expected Outcomes  Short:  Kayla Malone will continue taking her mood medication and will contact her Dr. if her symptoms do not improve in the near future - with the use of consistent exercise.   Long:  Kayla Malone will continue to practice positive self-care for her health and mental health for an improved quality of life.    Continue Psychosocial Services   Follow up required by counselor       Psychosocial Re-Evaluation:   Psychosocial Discharge (Final Psychosocial Re-Evaluation):   Vocational Rehabilitation: Provide vocational rehab assistance to qualifying candidates.   Vocational Rehab Evaluation & Intervention: Vocational Rehab - 10/11/18 1400      Initial Vocational Rehab Evaluation & Intervention   Assessment shows need for Vocational Rehabilitation  No       Education: Education Goals: Education classes will be provided on a variety of topics geared toward better understanding of heart health and risk factor modification. Participant will state understanding/return demonstration of topics presented as noted by education test scores.  Learning Barriers/Preferences: Learning Barriers/Preferences - 10/11/18 1348      Learning Barriers/Preferences   Learning Barriers  Exercise Concerns    Learning  Preferences  Audio;Video       Education Topics:  AED/CPR: - Group verbal and written instruction with the use of models to demonstrate the basic use of the AED with the basic ABC's of resuscitation.   Cardiac Rehab from 05/09/2018 in [facility] Cardiac and Pulmonary Rehab  Date  05/02/18  Educator  CE  Instruction Review Code  1- Verbalizes Understanding      General Nutrition Guidelines/Fats and Fiber: -Group instruction provided  by verbal, written material, models and posters to present the general guidelines for heart healthy nutrition. Gives an explanation and review of dietary fats and fiber.   Controlling Sodium/Reading Food Labels: -Group verbal and written material supporting the discussion of sodium use in heart healthy nutrition. Review and explanation with models, verbal and written materials for utilization of the food label.   Cardiac Rehab from 10/24/2018 in [facility] Cardiac and Pulmonary Rehab  Date  10/17/18  Educator  LB  Instruction Review Code  1- Verbalizes Understanding      Exercise Physiology & General Exercise Guidelines: - Group verbal and written instruction with models to review the exercise physiology of the cardiovascular system and associated critical values. Provides general exercise guidelines with specific guidelines to those with heart or lung disease.    Cardiac Rehab from 05/09/2018 in [facility] Cardiac and Pulmonary Rehab  Date  03/21/18  Educator  [facility]  Instruction Review Code  1- Verbalizes Understanding      Aerobic Exercise & Resistance Training: - Gives group verbal and written instruction on the various components of exercise. Focuses on aerobic and resistive training programs and the benefits of this training and how to safely progress through these programs..   Cardiac Rehab from 05/09/2018 in [facility] Cardiac and Pulmonary Rehab  Date  03/26/18  Educator  [facility]  Instruction Review Code  1- Geologist, engineering, Balance, Mind/Body  Relaxation: Provides group verbal/written instruction on the benefits of flexibility and balance training, including mind/body exercise modes such as yoga, pilates and tai chi.  Demonstration and skill practice provided.   Stress and Anxiety: - Provides group verbal and written instruction about the health risks of elevated stress and causes of high stress.  Discuss the correlation between heart/lung disease and anxiety and treatment options. Review healthy ways to manage with stress and anxiety.   Cardiac Rehab from 05/09/2018 in [facility] Cardiac and Pulmonary Rehab  Date  02/13/18  Educator  [facility]  Instruction Review Code  1- Verbalizes Understanding      Depression: - Provides group verbal and written instruction on the correlation between heart/lung disease and depressed mood, treatment options, and the stigmas associated with seeking treatment.   Cardiac Rehab from 05/09/2018 in [facility] Cardiac and Pulmonary Rehab  Date  03/28/18  Educator  [facility]  Instruction Review Code  1- Verbalizes Understanding      Anatomy & Physiology of the Heart: - Group verbal and written instruction and models provide basic cardiac anatomy and physiology, with the coronary electrical and arterial systems. Review of Valvular disease and Heart Failure   Cardiac Rehab from 05/09/2018 in [facility] Cardiac and Pulmonary Rehab  Date  04/09/18  Educator  CE  Instruction Review Code  1- Verbalizes Understanding      Cardiac Procedures: - Group verbal and written instruction to review commonly prescribed medications for heart disease. Reviews the medication, class of the drug, and side effects. Includes the steps to properly store meds and maintain the prescription regimen. (beta blockers and nitrates)   Cardiac Medications I: - Group verbal and written instruction to review commonly prescribed medications for heart disease. Reviews the medication, class of the drug, and side effects. Includes the steps to properly store meds  and maintain the prescription regimen.   Cardiac Rehab from 05/09/2018 in [facility] Cardiac and Pulmonary Rehab  Date  04/16/18  Educator  CE  Instruction Review Code  1- Verbalizes Understanding      Cardiac Medications II: -  Group verbal and written instruction to review commonly prescribed medications for heart disease. Reviews the medication, class of the drug, and side effects. (all other drug classes)   Cardiac Rehab from 05/09/2018 in [facility] Cardiac and Pulmonary Rehab  Date  02/15/18  Educator  [place]  Instruction Review Code  1- Verbalizes Understanding       Go Sex-Intimacy & Heart Disease, Get SMART - Goal Setting: - Group verbal and written instruction through game format to discuss heart disease and the return to sexual intimacy. Provides group verbal and written material to discuss and apply goal setting through the application of the S.M.A.R.T. Method.   Other Matters of the Heart: - Provides group verbal, written materials and models to describe Stable Angina and Peripheral Artery. Includes description of the disease process and treatment options available to the cardiac patient.   Cardiac Rehab from 05/09/2018 in [facility] Cardiac and Pulmonary Rehab  Date  04/09/18  Educator  CE  Instruction Review Code  1- Verbalizes Understanding      Exercise & Equipment Safety: - Individual verbal instruction and demonstration of equipment use and safety with use of the equipment.   Cardiac Rehab from 10/24/2018 in [facility] Cardiac and Pulmonary Rehab  Date  10/11/18  Educator  [facility]  Instruction Review Code  1- Verbalizes Understanding      Infection Prevention: - Provides verbal and written material to individual with discussion of infection control including proper hand washing and proper equipment cleaning during exercise session.   Cardiac Rehab from 10/24/2018 in [facility] Cardiac and Pulmonary Rehab  Date  10/11/18  Educator  [facility]  Instruction Review Code  1- Verbalizes Understanding       Falls Prevention: - Provides verbal and written material to individual with discussion of falls prevention and safety.   Cardiac Rehab from 10/24/2018 in [facility] Cardiac and Pulmonary Rehab  Date  10/11/18  Educator  [facility]  Instruction Review Code  1- Verbalizes Understanding      Diabetes: - Individual verbal and written instruction to review signs/symptoms of diabetes, desired ranges of glucose level fasting, after meals and with exercise. Acknowledge that pre and post exercise glucose checks will be done for 3 sessions at entry of program.   Cardiac Rehab from 10/24/2018 in [facility] Cardiac and Pulmonary Rehab  Date  10/11/18  Educator  [facility]  Instruction Review Code  1- Verbalizes Understanding      Know Your Numbers and Risk Factors: -Group verbal and written instruction about important numbers in your health.  Discussion of what are risk factors and how they play a role in the disease process.  Review of Cholesterol, Blood Pressure, Diabetes, and BMI and the role they play in your overall health.   Cardiac Rehab from 05/09/2018 in [facility] Cardiac and Pulmonary Rehab  Date  02/15/18  Educator  [facility]  Instruction Review Code  1- Verbalizes Understanding      Sleep Hygiene: -Provides group verbal and written instruction about how sleep can affect your health.  Define sleep hygiene, discuss sleep cycles and impact of sleep habits. Review good sleep hygiene tips.    Cardiac Rehab from 10/24/2018 in [facility] Cardiac and Pulmonary Rehab  Date  10/24/18  Educator  Lucianne Lei, MSW  Instruction Review Code  1- Verbalizes Understanding      Other: -Provides group and verbal instruction on various topics (see comments)   Knowledge Questionnaire Score: Knowledge Questionnaire Score - 10/11/18 1359      Knowledge Questionnaire Score  Pre Score  21/26   correct answers reviewed with [facility]. Focus on nutrition and exercise.       Core Components/Risk Factors/Patient Goals at  Admission: Personal Goals and Risk Factors at Admission - 10/11/18 1347      Core Components/Risk Factors/Patient Goals on Admission    Weight Management  Yes;Weight Maintenance    Intervention  Weight Management: Develop a combined nutrition and exercise program designed to reach desired caloric intake, while maintaining appropriate intake of nutrient and fiber, sodium and fats, and appropriate energy expenditure required for the weight goal.;Weight Management: Provide education and appropriate resources to help participant work on and attain dietary goals.    Expected Outcomes  Short Term: Continue to assess and modify interventions until short term weight is achieved;Long Term: Adherence to nutrition and physical activity/exercise program aimed toward attainment of established weight goal;Weight Maintenance: Understanding of the daily nutrition guidelines, which includes 25-35% calories from fat, 7% or less cal from saturated fats, less than 274m cholesterol, less than 1.5gm of sodium, & 5 or more servings of fruits and vegetables daily;Understanding recommendations for meals to include 15-35% energy as protein, 25-35% energy from fat, 35-60% energy from carbohydrates, less than 2063mof dietary cholesterol, 20-35 gm of total fiber daily;Understanding of distribution of calorie intake throughout the day with the consumption of 4-5 meals/snacks    Diabetes  Yes    Intervention  Provide education about signs/symptoms and action to Malone for hypo/hyperglycemia.;Provide education about proper nutrition, including hydration, and aerobic/resistive exercise prescription along with prescribed medications to achieve blood glucose in normal ranges: Fasting glucose 65-99 mg/dL    Expected Outcomes  Long Term: Attainment of HbA1C < 7%.;Short Term: Participant verbalizes understanding of the signs/symptoms and immediate care of hyper/hypoglycemia, proper foot care and importance of medication, aerobic/resistive  exercise and nutrition plan for blood glucose control.    Hypertension  Yes    Intervention  Provide education on lifestyle modifcations including regular physical activity/exercise, weight management, moderate sodium restriction and increased consumption of fresh fruit, vegetables, and low fat dairy, alcohol moderation, and smoking cessation.;Monitor prescription use compliance.    Expected Outcomes  Short Term: Continued assessment and intervention until BP is < 140/9082mG in hypertensive participants. < 130/47m70m in hypertensive participants with diabetes, heart failure or chronic Malone disease.;Long Term: Maintenance of blood pressure at goal levels.    Lipids  Yes    Intervention  Provide education and support for participant on nutrition & aerobic/resistive exercise along with prescribed medications to achieve LDL <70mg62mL >40mg.67mExpected Outcomes  Short Term: Participant states understanding of desired cholesterol values and is compliant with medications prescribed. Participant is following exercise prescription and nutrition guidelines.;Long Term: Cholesterol controlled with medications as prescribed, with individualized exercise RX and with personalized nutrition plan. Value goals: LDL < 70mg, 6m> 40 mg.       Core Components/Risk Factors/Patient Goals Review:    Core Components/Risk Factors/Patient Goals at Discharge (Final Review):    ITP Comments: ITP Comments    Row Name 10/11/18 1339 10/17/18 0611         ITP Comments  Med Review completed. Initial ITP created. Diagnosis can be found in [facility] 10/14  30 day review. Continue with ITP unless direccted changes per Medical Director Chart Review.         Comments:   On 10/29/2018 Kayla Malone did not complete her rehab session.  Since upon arrival Kayla c/Malone [facility]10 chest pressure all  day long today (blood pressure was 130/70. She did not Malone any NTG so we gave her 0.61m sl NTG at 4:12pm  which provided relief (chest  pressure was 1/10 at 4:20pm. With bp 142/80 pulse 63. Her finger stick blood sugar was 75 so given 4 glucose tablets. Blood sugar came up to 113 and then we had her eat peanut butter crackers. AT 4:25pm she was pain free. Kayla Malone NTG sl every 5 minutes and if no relief after 2nd NTG to call 911. MChristiniahas a MD appt this Wednesday. Kennesha went home and did not exercise in Cardiac REhab  but instructed if she has further chest pain to Malone sl NTG and if no relief after she Malone it to call 911.

## 2018-11-01 ENCOUNTER — Telehealth: Payer: Self-pay

## 2018-11-01 DIAGNOSIS — I214 Non-ST elevation (NSTEMI) myocardial infarction: Secondary | ICD-10-CM

## 2018-11-01 NOTE — Telephone Encounter (Signed)
Patient called to inform us that her stress test went well and she will be back on 11/05/18.

## 2018-11-06 ENCOUNTER — Telehealth: Payer: Self-pay

## 2018-11-06 NOTE — Telephone Encounter (Signed)
Kayla Malone plans to attend tomorrow

## 2018-11-07 ENCOUNTER — Encounter: Payer: Medicare Other | Admitting: *Deleted

## 2018-11-07 DIAGNOSIS — Z955 Presence of coronary angioplasty implant and graft: Secondary | ICD-10-CM | POA: Diagnosis not present

## 2018-11-07 DIAGNOSIS — I214 Non-ST elevation (NSTEMI) myocardial infarction: Secondary | ICD-10-CM

## 2018-11-07 NOTE — Progress Notes (Signed)
Daily Session Note  Patient Details  Name: Kayla Malone MRN: [id number] Date of Birth: Mar 27, 1961 Referring Provider:     Cardiac Rehab from 10/11/2018 in [facility] Cardiac and Pulmonary Rehab  Referring Provider  Humphrey Rolls      Encounter Date: 11/07/2018  Check In: Session Check In - 11/07/18 1639      Check-In   Supervising physician immediately available to respond to emergencies  See telemetry face sheet for immediately available ER MD    Location  [facility]-Cardiac & Pulmonary Rehab    Staff Present  Renita Papa, RN BSN;Carroll Enterkin, RN, Vickki Hearing, BA, ACSM CEP, Exercise Physiologist    Medication changes reported      No    Fall or balance concerns reported     No    Tobacco Cessation  No Change    Warm-up and Cool-down  Performed as group-led instruction    Resistance Training Performed  Yes    VAD Patient?  No    PAD/SET Patient?  No      Pain Assessment   Currently in Pain?  No/denies          Social History   Tobacco Use  Smoking Status Former Smoker  . Last attempt to quit: 09/13/1998  . Years since quitting: 20.1  Smokeless Tobacco Never Used    Goals Met:  Independence with exercise equipment Exercise tolerated well No report of cardiac concerns or symptoms Strength training completed today  Goals Unmet:  Not Applicable  Comments: Pt able to follow exercise prescription today without complaint.  Will continue to monitor for progression.    Dr. Emily Filbert is Medical Director for [name].

## 2018-11-12 ENCOUNTER — Encounter: Payer: Medicare Other | Attending: Cardiovascular Disease

## 2018-11-12 ENCOUNTER — Telehealth: Payer: Self-pay

## 2018-11-12 DIAGNOSIS — Z955 Presence of coronary angioplasty implant and graft: Secondary | ICD-10-CM | POA: Insufficient documentation

## 2018-11-12 DIAGNOSIS — I214 Non-ST elevation (NSTEMI) myocardial infarction: Secondary | ICD-10-CM | POA: Insufficient documentation

## 2018-11-12 NOTE — Telephone Encounter (Signed)
Patient states she is having a procedure for her heart and will return to [facility] with a doctors note.

## 2018-11-13 DIAGNOSIS — T82857A Stenosis of cardiac prosthetic devices, implants and grafts, initial encounter: Secondary | ICD-10-CM | POA: Insufficient documentation

## 2018-11-13 DIAGNOSIS — E041 Nontoxic single thyroid nodule: Secondary | ICD-10-CM | POA: Insufficient documentation

## 2018-11-13 DIAGNOSIS — I739 Peripheral vascular disease, unspecified: Secondary | ICD-10-CM | POA: Insufficient documentation

## 2018-11-13 DIAGNOSIS — Z9861 Coronary angioplasty status: Secondary | ICD-10-CM | POA: Insufficient documentation

## 2018-11-13 HISTORY — DX: Coronary angioplasty status: Z98.61

## 2018-11-14 ENCOUNTER — Encounter: Payer: Self-pay | Admitting: *Deleted

## 2018-11-14 DIAGNOSIS — Z955 Presence of coronary angioplasty implant and graft: Secondary | ICD-10-CM

## 2018-11-14 DIAGNOSIS — I214 Non-ST elevation (NSTEMI) myocardial infarction: Secondary | ICD-10-CM

## 2018-11-14 NOTE — Progress Notes (Signed)
Cardiac Individual Treatment Plan  Patient Details  Name: Kayla Malone MRN: [id number] Date of Birth: 10/23/1961 Referring Provider:     Cardiac Rehab from 10/11/2018 in [facility] Cardiac and Pulmonary Rehab  Referring Provider  Humphrey Rolls      Initial Encounter Date:    Cardiac Rehab from 10/11/2018 in [facility] Cardiac and Pulmonary Rehab  Date  10/11/18      Visit Diagnosis: NSTEMI (non-ST elevated myocardial infarction) [name])  Status post coronary artery stent placement  Patient's Home Medications on Admission:  Current Outpatient Medications:  .  acetaminophen (TYLENOL) 500 MG tablet, Take 1,000 mg by mouth every 6 (six) hours as needed for mild pain or headache., Disp: , Rfl:  .  albuterol (ACCUNEB) 0.63 MG/3ML nebulizer solution, Take 1 ampule by nebulization every 6 (six) hours as needed for wheezing., Disp: , Rfl:  .  albuterol (PROVENTIL HFA;VENTOLIN HFA) 108 (90 Base) MCG/ACT inhaler, Inhale 2 puffs into the lungs every 6 (six) hours as needed for wheezing or shortness of breath. , Disp: , Rfl:  .  aspirin EC 81 MG tablet, Take 81 mg by mouth daily., Disp: , Rfl:  .  atorvastatin (LIPITOR) 40 MG tablet, Take 80 mg by mouth daily. , Disp: , Rfl:  .  fluticasone (FLONASE) 50 MCG/ACT nasal spray, Place 2 sprays into both nostrils daily., Disp: , Rfl:  .  Fluticasone-Salmeterol (ADVAIR DISKUS) 500-50 MCG/DOSE AEPB, Inhale 1 puff into the lungs daily as needed (SOB). , Disp: , Rfl:  .  losartan (COZAAR) 50 MG tablet, Take 50 mg by mouth daily., Disp: , Rfl: 2 .  metFORMIN (GLUCOPHAGE) 500 MG tablet, Take 1,000 mg by mouth every morning. , Disp: , Rfl: 5 .  metFORMIN (GLUCOPHAGE) 500 MG tablet, Take 500 mg by mouth every evening., Disp: , Rfl:  .  metoprolol succinate (TOPROL-XL) 50 MG 24 hr tablet, Take 50 mg by mouth daily. Take with or immediately following a meal., Disp: , Rfl:  .  nitroGLYCERIN (NITROSTAT) 0.4 MG SL tablet, Place 1 tablet (0.4 mg total) under the tongue every 5  (five) minutes x 3 doses as needed for chest pain., Disp: 25 tablet, Rfl: 0 .  pantoprazole (PROTONIX) 40 MG tablet, Take 40 mg by mouth. , Disp: , Rfl:  .  sitaGLIPtin (JANUVIA) 50 MG tablet, Take 50 mg by mouth daily., Disp: , Rfl:  .  ticagrelor (BRILINTA) 90 MG TABS tablet, Take 1 tablet (90 mg total) by mouth 2 (two) times daily., Disp: 30 tablet, Rfl: 0 .  traZODone (DESYREL) 50 MG tablet, Take 50 mg by mouth at bedtime., Disp: , Rfl: 5 .  VOLTAREN 1 % GEL, Apply 2 g topically as needed (pain in shoulder). , Disp: , Rfl: 2  Past Medical History: Past Medical History:  Diagnosis Date  . Asthma   . Coronary artery disease   . Depression   . Diabetes mellitus without complication ([name])   . GERD (gastroesophageal reflux disease)   . History of hiatal hernia   . Hypertension   . Pain    BACK  . Stroke ([name])    x3  . Thyroid cyst     Tobacco Use: Social History   Tobacco Use  Smoking Status Former Smoker  . Last attempt to quit: 09/13/1998  . Years since quitting: 20.1  Smokeless Tobacco Never Used    Labs: Recent Review Scientist, physiological    Labs for ITP Cardiac and Pulmonary Rehab Latest Ref Rng & Units 10/23/2017 10/23/2017  10/23/2017 10/23/2017 10/24/2017   Hemoglobin A1c 4.8 - 5.6 % 7.3(H) - - - -   PHART 7.350 - 7.450 - 7.359 7.402 - -   PCO2ART 32.0 - 48.0 mmHg - 44.0 41.4 - -   HCO3 20.0 - 28.0 mmol/L - 25.1 26.0 - -   TCO2 22 - 32 mmol/L - _0 ACIDBASEDEF 0.0 - 2.0 mmol/L - 1.0 - - -   O2SAT % - 99.0 99.0 - -       Exercise Target Goals: Exercise Program Goal: Individual exercise prescription set using results from initial 6 min walk test and THRR while considering  patient's activity barriers and safety.   Exercise Prescription Goal: Initial exercise prescription builds to 30-45 minutes a day of aerobic activity, 2-3 days per week.  Home exercise guidelines will be given to patient during program as part of exercise prescription that the participant  will acknowledge.  Activity Barriers & Risk Stratification:   6 Minute Walk: 6 Minute Walk    Row Name 10/11/18 1351         6 Minute Walk   Phase  Initial     Distance  700 feet     Walk Time  5.75 minutes     # of Rest Breaks  1     MPH  1.32     METS  2.72     RPE  12     Perceived Dyspnea   1     VO2 Peak  9.5     Symptoms  Yes (comment)     Comments  foot drag when she fatigues     Resting HR  72 bpm     Resting BP  148/82     Resting Oxygen Saturation   97 %     Exercise Oxygen Saturation  during 6 min walk  98 %     Max Ex. HR  95 bpm     Max Ex. BP  150/78     2 Minute Post BP  130/78        Oxygen Initial Assessment:   Oxygen Re-Evaluation:   Oxygen Discharge (Final Oxygen Re-Evaluation):   Initial Exercise Prescription: Initial Exercise Prescription - 10/11/18 1300      Date of Initial Exercise RX and Referring Provider   Date  10/11/18    Referring Provider  Humphrey Rolls      NuStep   Level  1    SPM  80    Minutes  15    METs  2.7      Biostep-RELP   Level  2    SPM  50    Minutes  15    METs  2      Track   Laps  25    Minutes  15    METs  2.15      Prescription Details   Frequency (times per week)  3    Duration  Progress to 45 minutes of aerobic exercise without signs/symptoms of physical distress      Intensity   THRR 40-80% of Max Heartrate  108-144    Ratings of Perceived Exertion  11-13    Perceived Dyspnea  0-4      Resistance Training   Training Prescription  Yes    Weight  3 lb    Reps  10-15       Perform Capillary Blood Glucose checks as needed.  Exercise Prescription Changes: Exercise Prescription  Changes    Row Name 10/11/18 1400 10/24/18 1100 11/12/18 1400         Response to Exercise   Blood Pressure (Admit)  148/82  140/80  130/62     Blood Pressure (Exercise)  150/78  140/80  120/68     Blood Pressure (Exit)  130/78  124/74  120/62     Heart Rate (Admit)  72 bpm  75 bpm  64 bpm     Heart Rate  (Exercise)  95 bpm  96 bpm  92 bpm     Heart Rate (Exit)  63 bpm  74 bpm  63 bpm     Rating of Perceived Exertion (Exercise)  '12  14  15     '$ Perceived Dyspnea (Exercise)  1  -  -     Symptoms  foot drag  -  -     Comments  -  first session  -     Duration  -  Progress to 45 minutes of aerobic exercise without signs/symptoms of physical distress  Progress to 45 minutes of aerobic exercise without signs/symptoms of physical distress     Intensity  -  THRR unchanged  THRR unchanged       Progression   Progression  -  Continue to progress workloads to maintain intensity without signs/symptoms of physical distress.  Continue to progress workloads to maintain intensity without signs/symptoms of physical distress.     Average METs  -  2  2       Resistance Training   Training Prescription  -  Yes  Yes     Weight  -  3 lb  3 lb     Reps  -  10-15  10-15       Interval Training   Interval Training  -  No  No       Biostep-RELP   Level  -  2  2     SPM  -  50  50     Minutes  -  15  30     METs  -  2  2        Exercise Comments:   Exercise Goals and Review: Exercise Goals    Row Name 10/11/18 1350             Exercise Goals   Increase Physical Activity  Yes       Intervention  Provide advice, education, support and counseling about physical activity/exercise needs.;Develop an individualized exercise prescription for aerobic and resistive training based on initial evaluation findings, risk stratification, comorbidities and participant's personal goals.       Expected Outcomes  Short Term: Attend rehab on a regular basis to increase amount of physical activity.;Long Term: Add in home exercise to make exercise part of routine and to increase amount of physical activity.;Long Term: Exercising regularly at least 3-5 days a week.       Increase Strength and Stamina  Yes       Intervention  Provide advice, education, support and counseling about physical activity/exercise needs.;Develop  an individualized exercise prescription for aerobic and resistive training based on initial evaluation findings, risk stratification, comorbidities and participant's personal goals.       Expected Outcomes  Short Term: Increase workloads from initial exercise prescription for resistance, speed, and METs.;Short Term: Perform resistance training exercises routinely during rehab and add in resistance training at home;Long Term: Improve cardiorespiratory fitness, muscular endurance and strength as measured by  increased METs and functional capacity (6MWT)       Able to understand and use rate of perceived exertion (RPE) scale  Yes       Intervention  Provide education and explanation on how to use RPE scale       Expected Outcomes  Short Term: Able to use RPE daily in rehab to express subjective intensity level;Long Term:  Able to use RPE to guide intensity level when exercising independently       Knowledge and understanding of Target Heart Rate Range (THRR)  Yes       Intervention  Provide education and explanation of THRR including how the numbers were predicted and where they are located for reference       Expected Outcomes  Short Term: Able to state/look up THRR;Short Term: Able to use daily as guideline for intensity in rehab;Long Term: Able to use THRR to govern intensity when exercising independently       Able to check pulse independently  Yes       Intervention  Provide education and demonstration on how to check pulse in carotid and radial arteries.;Review the importance of being able to check your own pulse for safety during independent exercise       Expected Outcomes  Short Term: Able to explain why pulse checking is important during independent exercise;Long Term: Able to check pulse independently and accurately       Understanding of Exercise Prescription  Yes       Intervention  Provide education, explanation, and written materials on patient's individual exercise prescription        Expected Outcomes  Short Term: Able to explain program exercise prescription;Long Term: Able to explain home exercise prescription to exercise independently          Exercise Goals Re-Evaluation : Exercise Goals Re-Evaluation    Row Name 11/12/18 1504             Exercise Goal Re-Evaluation   Exercise Goals Review  Increase Physical Activity;Increase Strength and Stamina;Able to understand and use rate of perceived exertion (RPE) scale;Knowledge and understanding of Target Heart Rate Range (THRR)       Comments  Ruhani has only attended 4 sessions this month.  She would progress more with consistent attendance.         Expected Outcomes  Short - attend consistently 3 times per week Long - increase overall MET level          Discharge Exercise Prescription (Final Exercise Prescription Changes): Exercise Prescription Changes - 11/12/18 1400      Response to Exercise   Blood Pressure (Admit)  130/62    Blood Pressure (Exercise)  120/68    Blood Pressure (Exit)  120/62    Heart Rate (Admit)  64 bpm    Heart Rate (Exercise)  92 bpm    Heart Rate (Exit)  63 bpm    Rating of Perceived Exertion (Exercise)  15    Duration  Progress to 45 minutes of aerobic exercise without signs/symptoms of physical distress    Intensity  THRR unchanged      Progression   Progression  Continue to progress workloads to maintain intensity without signs/symptoms of physical distress.    Average METs  2      Resistance Training   Training Prescription  Yes    Weight  3 lb    Reps  10-15      Interval Training   Interval Training  No  Biostep-RELP   Level  2    SPM  50    Minutes  30    METs  2       Nutrition:  Target Goals: Understanding of nutrition guidelines, daily intake of sodium '1500mg'$ , cholesterol '200mg'$ , calories 30% from fat and 7% or less from saturated fats, daily to have 5 or more servings of fruits and vegetables.  Biometrics: Pre Biometrics - 10/11/18 1350      Pre  Biometrics   Height  5' 4.25" (1.632 m)    Weight  146 lb 14.4 oz (66.6 kg)    Waist Circumference  32 inches    Hip Circumference  36.5 inches    Waist to Hip Ratio  0.88 %    BMI (Calculated)  25.02        Nutrition Therapy Plan and Nutrition Goals: Nutrition Therapy & Goals - 10/11/18 1409      Intervention Plan   Intervention  Prescribe, educate and counsel regarding individualized specific dietary modifications aiming towards targeted core components such as weight, hypertension, lipid management, diabetes, heart failure and other comorbidities.;Nutrition handout(s) given to patient.    Expected Outcomes  Short Term Goal: Understand basic principles of dietary content, such as calories, fat, sodium, cholesterol and nutrients.;Short Term Goal: A plan has been developed with personal nutrition goals set during dietitian appointment.;Long Term Goal: Adherence to prescribed nutrition plan.       Nutrition Assessments: Nutrition Assessments - 10/11/18 1409      MEDFICTS Scores   Pre Score  0   no appetite. barely eats anything through out the day.       Nutrition Goals Re-Evaluation:   Nutrition Goals Discharge (Final Nutrition Goals Re-Evaluation):   Psychosocial: Target Goals: Acknowledge presence or absence of significant depression and/or stress, maximize coping skills, provide positive support system. Participant is able to verbalize types and ability to use techniques and skills needed for reducing stress and depression.   Initial Review & Psychosocial Screening: Initial Psych Review & Screening - 10/11/18 1401      Initial Review   Current issues with  History of Depression;Current Stress Concerns    Source of Stress Concerns  Poor Coping Skills;Unable to participate in former interests or hobbies;Chronic Illness;Financial    Comments  Amyjo is hesistant to do much outside of her home. It is hard for her to get going during the day. She still has very little appetite  and very low energy.       Family Dynamics   Good Support System?  Yes   she relies on her sister, but she is not in good health either. She did not mention any of her children this time.      Screening Interventions   Interventions  Encouraged to exercise;Program counselor consult;To provide support and resources with identified psychosocial needs    Expected Outcomes  Short Term goal: Utilizing psychosocial counselor, staff and physician to assist with identification of specific Stressors or current issues interfering with healing process. Setting desired goal for each stressor or current issue identified.;Long Term Goal: Stressors or current issues are controlled or eliminated.;Short Term goal: Identification and review with participant of any Quality of Life or Depression concerns found by scoring the questionnaire.;Long Term goal: The participant improves quality of Life and PHQ9 Scores as seen by post scores and/or verbalization of changes       Quality of Life Scores:  Quality of Life - 10/11/18 1403      Quality  of Life   Select  Quality of Life      Quality of Life Scores   Health/Function Pre  16.57 %    Socioeconomic Pre  23.5 %    Psych/Spiritual Pre  22.29 %    Family Pre  24.38 %    GLOBAL Pre  20.09 %      Scores of 19 and below usually indicate a poorer quality of life in these areas.  A difference of  2-3 points is a clinically meaningful difference.  A difference of 2-3 points in the total score of the Quality of Life Index has been associated with significant improvement in overall quality of life, self-image, physical symptoms, and general health in studies assessing change in quality of life.  PHQ-9: Recent Review Flowsheet Data    Depression screen [facility] 2/9 10/11/2018 05/09/2018 12/28/2017   Decreased Interest '3 2 2   '$ Down, Depressed, Hopeless '3 2 2   '$ PHQ - 2 Score '6 4 4   '$ Altered sleeping 0 2 2   Tired, decreased energy '3 2 2   '$ Change in appetite '3 2 3    '$ Feeling bad or failure about yourself  2 3 0   Trouble concentrating '3 2 1   '$ Moving slowly or fidgety/restless 0 1 0   Suicidal thoughts 0 0 0   PHQ-9 Score '17 16 12   '$ Difficult doing work/chores Very difficult Somewhat difficult Not difficult at all     Interpretation of Total Score  Total Score Depression Severity:  1-4 = Minimal depression, 5-9 = Mild depression, 10-14 = Moderate depression, 15-19 = Moderately severe depression, 20-27 = Severe depression   Psychosocial Evaluation and Intervention: Psychosocial Evaluation - 10/17/18 1619      Psychosocial Evaluation & Interventions   Interventions  Stress management education;Relaxation education;Encouraged to exercise with the program and follow exercise prescription    Comments  Counselor met with Ms. Keturah Barre Stanton Kidney) today for her return to Cardiac Rehab after 6 months - due to a mild heart attack and stent insertion.  Quinetta will be 57 years old this weekend and has a strong support system with (4) adult children and a significant other.  She reports sleeping "better" with the use of a prescription med but doesn't have much of an appetite.  She a history of depression and reports taking meds to help with this.  Her PHQ-9 scores are 17 indicating moderately severe symptoms of depression are present.  She has multiple stressors with her health; being disabled and finances.  Genoveva has goals to "get heart healthy" and get back to church - where she had just returned the day she had her mild heart attack.  Counselor will follow.      Expected Outcomes  Short:  Geniece will continue taking her mood medication and will contact her Dr. if her symptoms do not improve in the near future - with the use of consistent exercise.   Long:  Catina will continue to practice positive self-care for her health and mental health for an improved quality of life.    Continue Psychosocial Services   Follow up required by counselor       Psychosocial Re-Evaluation: Psychosocial  Re-Evaluation    [place] Name 11/07/18 1655             Psychosocial Re-Evaluation   Current issues with  Current Psychotropic Meds       Comments  Counselor follow up with Stanton Kidney today reporting she has begun two  new medications - one for her heart and one for her mental health/stress and anxiety.  She reports both seem to be working well and she is sleeping better also.  She has less stress and anxiety and her mood is more positive as a result.  Eleana states the medication for her heart is helping her feel less fearful about having another heart attack and this confidence in turn helps her relax more.  Counselor commended [place] on her progress and positive self-care.       Expected Outcomes  Short:  Dawnna will take her medications as directed by her Drs.  She will continue to exercise for her health and mental health.  Long:  Icie will develop routine healthy habits including exercise.         Continue Psychosocial Services   Follow up required by staff          Psychosocial Discharge (Final Psychosocial Re-Evaluation): Psychosocial Re-Evaluation - 11/07/18 1655      Psychosocial Re-Evaluation   Current issues with  Current Psychotropic Meds    Comments  Counselor follow up with Stanton Kidney today reporting she has begun two new medications - one for her heart and one for her mental health/stress and anxiety.  She reports both seem to be working well and she is sleeping better also.  She has less stress and anxiety and her mood is more positive as a result.  Tifini states the medication for her heart is helping her feel less fearful about having another heart attack and this confidence in turn helps her relax more.  Counselor commended [place] on her progress and positive self-care.    Expected Outcomes  Short:  Areal will take her medications as directed by her Drs.  She will continue to exercise for her health and mental health.  Long:  Latarsha will develop routine healthy habits including exercise.      Continue  Psychosocial Services   Follow up required by staff       Vocational Rehabilitation: Provide vocational rehab assistance to qualifying candidates.   Vocational Rehab Evaluation & Intervention: Vocational Rehab - 10/11/18 1400      Initial Vocational Rehab Evaluation & Intervention   Assessment shows need for Vocational Rehabilitation  No       Education: Education Goals: Education classes will be provided on a variety of topics geared toward better understanding of heart health and risk factor modification. Participant will state understanding/return demonstration of topics presented as noted by education test scores.  Learning Barriers/Preferences: Learning Barriers/Preferences - 10/11/18 1348      Learning Barriers/Preferences   Learning Barriers  Exercise Concerns    Learning Preferences  Audio;Video       Education Topics:  AED/CPR: - Group verbal and written instruction with the use of models to demonstrate the basic use of the AED with the basic ABC's of resuscitation.   Cardiac Rehab from 05/09/2018 in [facility] Cardiac and Pulmonary Rehab  Date  05/02/18  Educator  CE  Instruction Review Code  1- Verbalizes Understanding      General Nutrition Guidelines/Fats and Fiber: -Group instruction provided by verbal, written material, models and posters to present the general guidelines for heart healthy nutrition. Gives an explanation and review of dietary fats and fiber.   Controlling Sodium/Reading Food Labels: -Group verbal and written material supporting the discussion of sodium use in heart healthy nutrition. Review and explanation with models, verbal and written materials for utilization of the food label.   Cardiac  Rehab from 10/24/2018 in [facility] Cardiac and Pulmonary Rehab  Date  10/17/18  Educator  LB  Instruction Review Code  1- Verbalizes Understanding      Exercise Physiology & General Exercise Guidelines: - Group verbal and written instruction with models to  review the exercise physiology of the cardiovascular system and associated critical values. Provides general exercise guidelines with specific guidelines to those with heart or lung disease.    Cardiac Rehab from 05/09/2018 in [facility] Cardiac and Pulmonary Rehab  Date  03/21/18  Educator  [facility]  Instruction Review Code  1- Verbalizes Understanding      Aerobic Exercise & Resistance Training: - Gives group verbal and written instruction on the various components of exercise. Focuses on aerobic and resistive training programs and the benefits of this training and how to safely progress through these programs..   Cardiac Rehab from 05/09/2018 in [facility] Cardiac and Pulmonary Rehab  Date  03/26/18  Educator  [employer] [facility]  Instruction Review Code  1- Geologist, engineering, Balance, Mind/Body Relaxation: Provides group verbal/written instruction on the benefits of flexibility and balance training, including mind/body exercise modes such as yoga, pilates and tai chi.  Demonstration and skill practice provided.   Stress and Anxiety: - Provides group verbal and written instruction about the health risks of elevated stress and causes of high stress.  Discuss the correlation between heart/lung disease and anxiety and treatment options. Review healthy ways to manage with stress and anxiety.   Cardiac Rehab from 05/09/2018 in [facility] Cardiac and Pulmonary Rehab  Date  02/13/18  Educator  [facility]  Instruction Review Code  1- Verbalizes Understanding      Depression: - Provides group verbal and written instruction on the correlation between heart/lung disease and depressed mood, treatment options, and the stigmas associated with seeking treatment.   Cardiac Rehab from 05/09/2018 in [facility] Cardiac and Pulmonary Rehab  Date  03/28/18  Educator  [facility]  Instruction Review Code  1- Verbalizes Understanding      Anatomy & Physiology of the Heart: - Group verbal and written instruction and models provide basic  cardiac anatomy and physiology, with the coronary electrical and arterial systems. Review of Valvular disease and Heart Failure   Cardiac Rehab from 05/09/2018 in [facility] Cardiac and Pulmonary Rehab  Date  04/09/18  Educator  CE  Instruction Review Code  1- Verbalizes Understanding      Cardiac Procedures: - Group verbal and written instruction to review commonly prescribed medications for heart disease. Reviews the medication, class of the drug, and side effects. Includes the steps to properly store meds and maintain the prescription regimen. (beta blockers and nitrates)   Cardiac Medications I: - Group verbal and written instruction to review commonly prescribed medications for heart disease. Reviews the medication, class of the drug, and side effects. Includes the steps to properly store meds and maintain the prescription regimen.   Cardiac Rehab from 05/09/2018 in [facility] Cardiac and Pulmonary Rehab  Date  04/16/18  Educator  CE  Instruction Review Code  1- Verbalizes Understanding      Cardiac Medications II: -Group verbal and written instruction to review commonly prescribed medications for heart disease. Reviews the medication, class of the drug, and side effects. (all other drug classes)   Cardiac Rehab from 05/09/2018 in [facility] Cardiac and Pulmonary Rehab  Date  02/15/18  Educator  [facility] [place]  Instruction Review Code  1- Verbalizes Understanding       Go Sex-Intimacy & Heart  Disease, Get SMART - Goal Setting: - Group verbal and written instruction through game format to discuss heart disease and the return to sexual intimacy. Provides group verbal and written material to discuss and apply goal setting through the application of the S.M.A.R.T. Method.   Other Matters of the Heart: - Provides group verbal, written materials and models to describe Stable Angina and Peripheral Artery. Includes description of the disease process and treatment options available to the cardiac patient.   Cardiac  Rehab from 05/09/2018 in [facility] Cardiac and Pulmonary Rehab  Date  04/09/18  Educator  CE  Instruction Review Code  1- Verbalizes Understanding      Exercise & Equipment Safety: - Individual verbal instruction and demonstration of equipment use and safety with use of the equipment.   Cardiac Rehab from 10/24/2018 in [facility] Cardiac and Pulmonary Rehab  Date  10/11/18  Educator  [facility]  Instruction Review Code  1- Verbalizes Understanding      Infection Prevention: - Provides verbal and written material to individual with discussion of infection control including proper hand washing and proper equipment cleaning during exercise session.   Cardiac Rehab from 10/24/2018 in [facility] Cardiac and Pulmonary Rehab  Date  10/11/18  Educator  [facility]  Instruction Review Code  1- Verbalizes Understanding      Falls Prevention: - Provides verbal and written material to individual with discussion of falls prevention and safety.   Cardiac Rehab from 10/24/2018 in [facility] Cardiac and Pulmonary Rehab  Date  10/11/18  Educator  [facility]  Instruction Review Code  1- Verbalizes Understanding      Diabetes: - Individual verbal and written instruction to review signs/symptoms of diabetes, desired ranges of glucose level fasting, after meals and with exercise. Acknowledge that pre and post exercise glucose checks will be done for 3 sessions at entry of program.   Cardiac Rehab from 10/24/2018 in [facility] Cardiac and Pulmonary Rehab  Date  10/11/18  Educator  [facility]  Instruction Review Code  1- Verbalizes Understanding      Know Your Numbers and Risk Factors: -Group verbal and written instruction about important numbers in your health.  Discussion of what are risk factors and how they play a role in the disease process.  Review of Cholesterol, Blood Pressure, Diabetes, and BMI and the role they play in your overall health.   Cardiac Rehab from 05/09/2018 in [facility] Cardiac and Pulmonary Rehab  Date  02/15/18  Educator  [facility]   Instruction Review Code  1- Verbalizes Understanding      Sleep Hygiene: -Provides group verbal and written instruction about how sleep can affect your health.  Define sleep hygiene, discuss sleep cycles and impact of sleep habits. Review good sleep hygiene tips.    Cardiac Rehab from 10/24/2018 in [facility] Cardiac and Pulmonary Rehab  Date  10/24/18  Educator  Lucianne Lei, MSW  Instruction Review Code  1- Verbalizes Understanding      Other: -Provides group and verbal instruction on various topics (see comments)   Knowledge Questionnaire Score: Knowledge Questionnaire Score - 10/11/18 1359      Knowledge Questionnaire Score   Pre Score  21/26   correct answers reviewed with [facility]. Focus on nutrition and exercise.       Core Components/Risk Factors/Patient Goals at Admission: Personal Goals and Risk Factors at Admission - 10/11/18 1347      Core Components/Risk Factors/Patient Goals on Admission    Weight Management  Yes;Weight Maintenance    Intervention  Weight Management:  Develop a combined nutrition and exercise program designed to reach desired caloric intake, while maintaining appropriate intake of nutrient and fiber, sodium and fats, and appropriate energy expenditure required for the weight goal.;Weight Management: Provide education and appropriate resources to help participant work on and attain dietary goals.    Expected Outcomes  Short Term: Continue to assess and modify interventions until short term weight is achieved;Long Term: Adherence to nutrition and physical activity/exercise program aimed toward attainment of established weight goal;Weight Maintenance: Understanding of the daily nutrition guidelines, which includes 25-35% calories from fat, 7% or less cal from saturated fats, less than '200mg'$  cholesterol, less than 1.5gm of sodium, & 5 or more servings of fruits and vegetables daily;Understanding recommendations for meals to include 15-35% energy as protein, 25-35%  energy from fat, 35-60% energy from carbohydrates, less than '200mg'$  of dietary cholesterol, 20-35 gm of total fiber daily;Understanding of distribution of calorie intake throughout the day with the consumption of 4-5 meals/snacks    Diabetes  Yes    Intervention  Provide education about signs/symptoms and action to take for hypo/hyperglycemia.;Provide education about proper nutrition, including hydration, and aerobic/resistive exercise prescription along with prescribed medications to achieve blood glucose in normal ranges: Fasting glucose 65-99 mg/dL    Expected Outcomes  Long Term: Attainment of HbA1C < 7%.;Short Term: Participant verbalizes understanding of the signs/symptoms and immediate care of hyper/hypoglycemia, proper foot care and importance of medication, aerobic/resistive exercise and nutrition plan for blood glucose control.    Hypertension  Yes    Intervention  Provide education on lifestyle modifcations including regular physical activity/exercise, weight management, moderate sodium restriction and increased consumption of fresh fruit, vegetables, and low fat dairy, alcohol moderation, and smoking cessation.;Monitor prescription use compliance.    Expected Outcomes  Short Term: Continued assessment and intervention until BP is < 140/64m HG in hypertensive participants. < 130/872mHG in hypertensive participants with diabetes, heart failure or chronic kidney disease.;Long Term: Maintenance of blood pressure at goal levels.    Lipids  Yes    Intervention  Provide education and support for participant on nutrition & aerobic/resistive exercise along with prescribed medications to achieve LDL '70mg'$ , HDL >'40mg'$ .    Expected Outcomes  Short Term: Participant states understanding of desired cholesterol values and is compliant with medications prescribed. Participant is following exercise prescription and nutrition guidelines.;Long Term: Cholesterol controlled with medications as prescribed, with  individualized exercise RX and with personalized nutrition plan. Value goals: LDL < '70mg'$ , HDL > 40 mg.       Core Components/Risk Factors/Patient Goals Review:    Core Components/Risk Factors/Patient Goals at Discharge (Final Review):    ITP Comments: ITP Comments    Row Name 10/11/18 1339 10/17/18 0611 11/01/18 1244 11/12/18 1557 11/14/18 0620   ITP Comments  Med Review completed. Initial ITP created. Diagnosis can be found in [facility] 10/14  30 day review. Continue with ITP unless direccted changes per Medical Director Chart Review.  Patient called to inform us[place] her stress test went well and she will be back on 11/05/18.  Patient states she is having a procedure for [facility] heart [facility]  30 day review. Continue with ITP unless direccted changes per Medical Director Chart Review.  OUt for medical reason      Comments:

## 2018-11-16 ENCOUNTER — Other Ambulatory Visit: Payer: Self-pay | Admitting: Family

## 2018-11-16 DIAGNOSIS — R102 Pelvic and perineal pain: Secondary | ICD-10-CM

## 2018-11-19 ENCOUNTER — Encounter: Payer: Medicare Other | Admitting: *Deleted

## 2018-11-19 DIAGNOSIS — Z951 Presence of aortocoronary bypass graft: Secondary | ICD-10-CM

## 2018-11-19 DIAGNOSIS — Z955 Presence of coronary angioplasty implant and graft: Secondary | ICD-10-CM

## 2018-11-19 DIAGNOSIS — I214 Non-ST elevation (NSTEMI) myocardial infarction: Secondary | ICD-10-CM | POA: Diagnosis not present

## 2018-11-19 LAB — GLUCOSE, CAPILLARY
GLUCOSE-CAPILLARY: 79 mg/dL (ref 70–99)
Glucose-Capillary: 110 mg/dL — ABNORMAL HIGH (ref 70–99)

## 2018-11-19 NOTE — Progress Notes (Signed)
Daily Session Note  Patient Details  Name: Kayla Malone MRN: [id number] Date of Birth: 1961/07/08 Referring Provider:     Cardiac Rehab from 10/11/2018 in [facility] Cardiac and Pulmonary Rehab  Referring Provider  Humphrey Rolls      Encounter Date: 11/19/2018  Check In: Session Check In - 11/19/18 1735      Check-In   Supervising physician immediately available to respond to emergencies  See telemetry face sheet for immediately available ER MD    Location  [facility]-Cardiac & Pulmonary Rehab    Staff Present  Earlean Shawl, BS, ACSM CEP, Exercise Physiologist;Carroll Enterkin, RN, BSN;Laureen Brown, BS, RRT, Respiratory Therapist    Medication changes reported      No    Fall or balance concerns reported     No    Tobacco Cessation  No Change    Warm-up and Cool-down  Performed as group-led instruction    Resistance Training Performed  Yes    VAD Patient?  No    PAD/SET Patient?  No      Pain Assessment   Currently in Pain?  No/denies    Multiple Pain Sites  No          Social History   Tobacco Use  Smoking Status Former Smoker  . Last attempt to quit: 09/13/1998  . Years since quitting: 20.1  Smokeless Tobacco Never Used    Goals Met:  Independence with exercise equipment Exercise tolerated well No report of cardiac concerns or symptoms Strength training completed today  Goals Unmet:  Not Applicable  Comments: Pt able to follow exercise prescription today without complaint.  Will continue to monitor for progression.   Post exercise blood glucose was 79 mg/dL. Patient was given 4 glucose tabs and blood glucose was 110 mg/dL. Patient was offered PB crackers but refused and said she was going home to eat dinner.    Dr. Emily Filbert is Medical Director for [place] and [name].

## 2018-11-21 ENCOUNTER — Ambulatory Visit
Admission: RE | Admit: 2018-11-21 | Discharge: 2018-11-21 | Disposition: A | Discharge status: Home / Self Care | Payer: Medicare Other | Source: Ambulatory Visit | Attending: Family | Admitting: Family

## 2018-11-21 DIAGNOSIS — R102 Pelvic and perineal pain: Secondary | ICD-10-CM | POA: Diagnosis not present

## 2018-11-21 DIAGNOSIS — Z90710 Acquired absence of both cervix and uterus: Secondary | ICD-10-CM | POA: Diagnosis not present

## 2018-11-30 ENCOUNTER — Telehealth: Payer: Self-pay

## 2018-11-30 NOTE — Telephone Encounter (Signed)
VM full

## 2018-12-07 ENCOUNTER — Telehealth: Payer: Self-pay

## 2018-12-07 NOTE — Telephone Encounter (Signed)
LMOM

## 2018-12-11 ENCOUNTER — Encounter: Payer: Self-pay | Admitting: *Deleted

## 2018-12-11 DIAGNOSIS — Z955 Presence of coronary angioplasty implant and graft: Secondary | ICD-10-CM

## 2018-12-11 DIAGNOSIS — I214 Non-ST elevation (NSTEMI) myocardial infarction: Secondary | ICD-10-CM

## 2018-12-11 NOTE — Progress Notes (Signed)
Cardiac Individual Treatment Plan  Patient Details  Name: Kayla Malone MRN: [id number] Date of Birth: 07-01-1961 Referring Provider:     Cardiac Rehab from 10/11/2018 in [facility] Cardiac and Pulmonary Rehab  Referring Provider  Humphrey Rolls      Initial Encounter Date:    Cardiac Rehab from 10/11/2018 in [facility] Cardiac and Pulmonary Rehab  Date  10/11/18      Visit Diagnosis: NSTEMI (non-ST elevated myocardial infarction) [facility])  Status post coronary artery stent placement  Patient's Home Medications on Admission:  Current Outpatient Medications:  .  acetaminophen (TYLENOL) 500 MG tablet, Take 1,000 mg by mouth every 6 (six) hours as needed for mild pain or headache., Disp: , Rfl:  .  albuterol (ACCUNEB) 0.63 MG/3ML nebulizer solution, Take 1 ampule by nebulization every 6 (six) hours as needed for wheezing., Disp: , Rfl:  .  albuterol (PROVENTIL HFA;VENTOLIN HFA) 108 (90 Base) MCG/ACT inhaler, Inhale 2 puffs into the lungs every 6 (six) hours as needed for wheezing or shortness of breath. , Disp: , Rfl:  .  aspirin EC 81 MG tablet, Take 81 mg by mouth daily., Disp: , Rfl:  .  atorvastatin (LIPITOR) 40 MG tablet, Take 80 mg by mouth daily. , Disp: , Rfl:  .  fluticasone (FLONASE) 50 MCG/ACT nasal spray, Place 2 sprays into both nostrils daily., Disp: , Rfl:  .  Fluticasone-Salmeterol (ADVAIR DISKUS) 500-50 MCG/DOSE AEPB, Inhale 1 puff into the lungs daily as needed (SOB). , Disp: , Rfl:  .  losartan (COZAAR) 50 MG tablet, Take 50 mg by mouth daily., Disp: , Rfl: 2 .  metFORMIN (GLUCOPHAGE) 500 MG tablet, Take 1,000 mg by mouth every morning. , Disp: , Rfl: 5 .  metFORMIN (GLUCOPHAGE) 500 MG tablet, Take 500 mg by mouth every evening., Disp: , Rfl:  .  metoprolol succinate (TOPROL-XL) 50 MG 24 hr tablet, Take 50 mg by mouth daily. Take with or immediately following a meal., Disp: , Rfl:  .  nitroGLYCERIN (NITROSTAT) 0.4 MG SL tablet, Place 1 tablet (0.4 mg total) under the tongue every 5  (five) minutes x 3 doses as needed for chest pain., Disp: 25 tablet, Rfl: 0 .  pantoprazole (PROTONIX) 40 MG tablet, Take 40 mg by mouth. , Disp: , Rfl:  .  sitaGLIPtin (JANUVIA) 50 MG tablet, Take 50 mg by mouth daily., Disp: , Rfl:  .  ticagrelor (BRILINTA) 90 MG TABS tablet, Take 1 tablet (90 mg total) by mouth 2 (two) times daily., Disp: 30 tablet, Rfl: 0 .  traZODone (DESYREL) 50 MG tablet, Take 50 mg by mouth at bedtime., Disp: , Rfl: 5 .  VOLTAREN 1 % GEL, Apply 2 g topically as needed (pain in shoulder). , Disp: , Rfl: 2  Past Medical History: Past Medical History:  Diagnosis Date  . Asthma   . Coronary artery disease   . Depression   . Diabetes mellitus without complication ([place])   . GERD (gastroesophageal reflux disease)   . History of hiatal hernia   . Hypertension   . Pain    BACK  . Stroke ([place])    x3  . Thyroid cyst     Tobacco Use: Social History   Tobacco Use  Smoking Status Former Smoker  . Last attempt to quit: 09/13/1998  . Years since quitting: 20.2  Smokeless Tobacco Never Used    Labs: Recent Review Scientist, physiological    Labs for ITP Cardiac and Pulmonary Rehab Latest Ref Rng & Units 10/23/2017 10/23/2017  10/23/2017 10/23/2017 10/24/2017   Hemoglobin A1c 4.8 - 5.6 % 7.3(H) - - - -   PHART 7.350 - 7.450 - 7.359 7.402 - -   PCO2ART 32.0 - 48.0 mmHg - 44.0 41.4 - -   HCO3 20.0 - 28.0 mmol/L - 25.1 26.0 - -   TCO2 22 - 32 mmol/L - _0 ACIDBASEDEF 0.0 - 2.0 mmol/L - 1.0 - - -   O2SAT % - 99.0 99.0 - -       Exercise Target Goals: Exercise Program Goal: Individual exercise prescription set using results from initial 6 min walk test and THRR while considering  patient's activity barriers and safety.   Exercise Prescription Goal: Initial exercise prescription builds to 30-45 minutes a day of aerobic activity, 2-3 days per week.  Home exercise guidelines will be given to patient during program as part of exercise prescription that the participant  will acknowledge.  Activity Barriers & Risk Stratification:   6 Minute Walk: 6 Minute Walk    Row Name 10/11/18 1351         6 Minute Walk   Phase  Initial     Distance  700 feet     Walk Time  5.75 minutes     # of Rest Breaks  1     MPH  1.32     METS  2.72     RPE  12     Perceived Dyspnea   1     VO2 Peak  9.5     Symptoms  Yes (comment)     Comments  foot drag when she fatigues     Resting HR  72 bpm     Resting BP  148/82     Resting Oxygen Saturation   97 %     Exercise Oxygen Saturation  during 6 min walk  98 %     Max Ex. HR  95 bpm     Max Ex. BP  150/78     2 Minute Post BP  130/78        Oxygen Initial Assessment:   Oxygen Re-Evaluation:   Oxygen Discharge (Final Oxygen Re-Evaluation):   Initial Exercise Prescription: Initial Exercise Prescription - 10/11/18 1300      Date of Initial Exercise RX and Referring Provider   Date  10/11/18    Referring Provider  Humphrey Rolls      NuStep   Level  1    SPM  80    Minutes  15    METs  2.7      Biostep-RELP   Level  2    SPM  50    Minutes  15    METs  2      Track   Laps  25    Minutes  15    METs  2.15      Prescription Details   Frequency (times per week)  3    Duration  Progress to 45 minutes of aerobic exercise without signs/symptoms of physical distress      Intensity   THRR 40-80% of Max Heartrate  108-144    Ratings of Perceived Exertion  11-13    Perceived Dyspnea  0-4      Resistance Training   Training Prescription  Yes    Weight  3 lb    Reps  10-15       Perform Capillary Blood Glucose checks as needed.  Exercise Prescription Changes: Exercise Prescription  Changes    Row Name 10/11/18 1400 10/24/18 1100 11/12/18 1400 11/21/18 1100       Response to Exercise   Blood Pressure (Admit)  148/82  140/80  130/62  122/60    Blood Pressure (Exercise)  150/78  140/80  120/68  148/84    Blood Pressure (Exit)  130/78  124/74  120/62  128/78    Heart Rate (Admit)  72 bpm  75 bpm   64 bpm  80 bpm    Heart Rate (Exercise)  95 bpm  96 bpm  92 bpm  103 bpm    Heart Rate (Exit)  63 bpm  74 bpm  63 bpm  63 bpm    Rating of Perceived Exertion (Exercise)  '12  14  15  13    '$ Perceived Dyspnea (Exercise)  1  -  -  -    Symptoms  foot drag  -  -  -    Comments  -  first session  -  -    Duration  -  Progress to 45 minutes of aerobic exercise without signs/symptoms of physical distress  Progress to 45 minutes of aerobic exercise without signs/symptoms of physical distress  Progress to 45 minutes of aerobic exercise without signs/symptoms of physical distress    Intensity  -  THRR unchanged  THRR unchanged  THRR unchanged      Progression   Progression  -  Continue to progress workloads to maintain intensity without signs/symptoms of physical distress.  Continue to progress workloads to maintain intensity without signs/symptoms of physical distress.  Continue to progress workloads to maintain intensity without signs/symptoms of physical distress.    Average METs  -  2  2  2.6      Resistance Training   Training Prescription  -  Yes  Yes  Yes    Weight  -  3 lb  3 lb  3 lb    Reps  -  10-15  10-15  10-15      Interval Training   Interval Training  -  No  No  No      NuStep   Level  -  -  -  3    SPM  -  -  -  80    Minutes  -  -  -  15    METs  -  -  -  3.2      Biostep-RELP   Level  -  '2  2  2    '$ SPM  -  50  50  50    Minutes  -  '15  30  15    '$ METs  -  '2  2  2       '$ Exercise Comments:   Exercise Goals and Review: Exercise Goals    Row Name 10/11/18 1350             Exercise Goals   Increase Physical Activity  Yes       Intervention  Provide advice, education, support and counseling about physical activity/exercise needs.;Develop an individualized exercise prescription for aerobic and resistive training based on initial evaluation findings, risk stratification, comorbidities and participant's personal goals.       Expected Outcomes  Short Term: Attend rehab  on a regular basis to increase amount of physical activity.;Long Term: Add in home exercise to make exercise part of routine and to increase amount of physical activity.;Long Term: Exercising regularly at least 3-5  days a week.       Increase Strength and Stamina  Yes       Intervention  Provide advice, education, support and counseling about physical activity/exercise needs.;Develop an individualized exercise prescription for aerobic and resistive training based on initial evaluation findings, risk stratification, comorbidities and participant's personal goals.       Expected Outcomes  Short Term: Increase workloads from initial exercise prescription for resistance, speed, and METs.;Short Term: Perform resistance training exercises routinely during rehab and add in resistance training at home;Long Term: Improve cardiorespiratory fitness, muscular endurance and strength as measured by increased METs and functional capacity (6MWT)       Able to understand and use rate of perceived exertion (RPE) scale  Yes       Intervention  Provide education and explanation on how to use RPE scale       Expected Outcomes  Short Term: Able to use RPE daily in rehab to express subjective intensity level;Long Term:  Able to use RPE to guide intensity level when exercising independently       Knowledge and understanding of Target Heart Rate Range (THRR)  Yes       Intervention  Provide education and explanation of THRR including how the numbers were predicted and where they are located for reference       Expected Outcomes  Short Term: Able to state/look up THRR;Short Term: Able to use daily as guideline for intensity in rehab;Long Term: Able to use THRR to govern intensity when exercising independently       Able to check pulse independently  Yes       Intervention  Provide education and demonstration on how to check pulse in carotid and radial arteries.;Review the importance of being able to check your own pulse for safety  during independent exercise       Expected Outcomes  Short Term: Able to explain why pulse checking is important during independent exercise;Long Term: Able to check pulse independently and accurately       Understanding of Exercise Prescription  Yes       Intervention  Provide education, explanation, and written materials on patient's individual exercise prescription       Expected Outcomes  Short Term: Able to explain program exercise prescription;Long Term: Able to explain home exercise prescription to exercise independently          Exercise Goals Re-Evaluation : Exercise Goals Re-Evaluation    Row Name 11/12/18 1504 11/21/18 1130           Exercise Goal Re-Evaluation   Exercise Goals Review  Increase Physical Activity;Increase Strength and Stamina;Able to understand and use rate of perceived exertion (RPE) scale;Knowledge and understanding of Target Heart Rate Range (THRR)  Increase Physical Activity;Increase Strength and Stamina;Able to understand and use rate of perceived exertion (RPE) scale;Knowledge and understanding of Target Heart Rate Range (THRR)      Comments  Agustina has only attended 4 sessions this month.  She would progress more with consistent attendance.    Orie did well on the NS.  She still needs to attend consistently to see progress.      Expected Outcomes  Short - attend consistently 3 times per week Long - increase overall MET level  Short - attend three days per week Long - increase MET level         Discharge Exercise Prescription (Final Exercise Prescription Changes): Exercise Prescription Changes - 11/21/18 1100      Response to  Exercise   Blood Pressure (Admit)  122/60    Blood Pressure (Exercise)  148/84    Blood Pressure (Exit)  128/78    Heart Rate (Admit)  80 bpm    Heart Rate (Exercise)  103 bpm    Heart Rate (Exit)  63 bpm    Rating of Perceived Exertion (Exercise)  13    Duration  Progress to 45 minutes of aerobic exercise without signs/symptoms of  physical distress    Intensity  THRR unchanged      Progression   Progression  Continue to progress workloads to maintain intensity without signs/symptoms of physical distress.    Average METs  2.6      Resistance Training   Training Prescription  Yes    Weight  3 lb    Reps  10-15      Interval Training   Interval Training  No      NuStep   Level  3    SPM  80    Minutes  15    METs  3.2      Biostep-RELP   Level  2    SPM  50    Minutes  15    METs  2       Nutrition:  Target Goals: Understanding of nutrition guidelines, daily intake of sodium '1500mg'$ , cholesterol '200mg'$ , calories 30% from fat and 7% or less from saturated fats, daily to have 5 or more servings of fruits and vegetables.  Biometrics: Pre Biometrics - 10/11/18 1350      Pre Biometrics   Height  5' 4.25" (1.632 m)    Weight  146 lb 14.4 oz (66.6 kg)    Waist Circumference  32 inches    Hip Circumference  36.5 inches    Waist to Hip Ratio  0.88 %    BMI (Calculated)  25.02        Nutrition Therapy Plan and Nutrition Goals: Nutrition Therapy & Goals - 11/19/18 1641      Nutrition Therapy   Diet  DM    Drug/Food Interactions  Statins/Certain Fruits    Protein (specify units)  9-10oz    Fiber  20 grams    Whole Grain Foods  3 servings   chooses some whole grains IE oatmeal   Saturated Fats  12 max. grams    Fruits and Vegetables  5 servings/day   8 ideal; likes fruits and vegetables but has a poor appetite most days   Sodium  1500 grams      Personal Nutrition Goals   Nutrition Goal  Try adding marinades, different seasonings/ herbs/ spices to foods to improve taste profile. Foods that have aromas like soup may help stimulate appetite as well    Personal Goal #2  On days you do not feel like eating it may be helpful to make a smoothie as an alternative/ liquid nutritional source, espeically since you do not like nutritional supplement drinks    Comments  She c/o severe decline in appetite  after losing her sense of smell/taste s/p mini stroke. She has been losing wt, approx 10# since her last surgery date. Currently she eats 1-2x/day, with some days being better than others in terms of appetite and "having a taste for things." She is a diabetic with BG readings typically 90-100 recently. Foods she likes and can tolerate: fruits, cheese, most vegetables, baked potatoes, salad, dry cereal, hard boiled eggs, oatmeal, soup. Does not usually eat breakfast. Drinks water and the  occasional tea. She has tried nutritional drinks like Ensure and dislikes them.      Intervention Plan   Intervention  Prescribe, educate and counsel regarding individualized specific dietary modifications aiming towards targeted core components such as weight, hypertension, lipid management, diabetes, heart failure and other comorbidities.;Nutrition handout(s) given to patient.   Nutrition tips for taste changes handout; Smoothie recipes   Expected Outcomes  Short Term Goal: Understand basic principles of dietary content, such as calories, fat, sodium, cholesterol and nutrients.;Short Term Goal: A plan has been developed with personal nutrition goals set during dietitian appointment.;Long Term Goal: Adherence to prescribed nutrition plan.       Nutrition Assessments: Nutrition Assessments - 10/11/18 1409      MEDFICTS Scores   Pre Score  0   no appetite. barely eats anything through out the day.       Nutrition Goals Re-Evaluation: Nutrition Goals Re-Evaluation    [place] Name 11/19/18 1656             Goals   Nutrition Goal  Try adding marinades, different seasonings/ herbs/ spices to foods to improve taste profile. Foods that have aromas like soup may help stimulate appetite as well       Comment  She c/o lack of smell and taste leading to poor appetite and desire to eat       Expected Outcome  She will try different flavorings and cooking methods to help encourage her to eat and to prevent further wt loss          Personal Goal #2 Re-Evaluation   Personal Goal #2  On days you do not feel like eating it may be helpful to make a smoothie as an alternative / liquid nutritional source, especially since you do not like nutritional supplement drinks          Nutrition Goals Discharge (Final Nutrition Goals Re-Evaluation): Nutrition Goals Re-Evaluation - 11/19/18 1656      Goals   Nutrition Goal  Try adding marinades, different seasonings/ herbs/ spices to foods to improve taste profile. Foods that have aromas like soup may help stimulate appetite as well    Comment  She c/o lack of smell and taste leading to poor appetite and desire to eat    Expected Outcome  She will try different flavorings and cooking methods to help encourage her to eat and to prevent further wt loss      Personal Goal #2 Re-Evaluation   Personal Goal #2  On days you do not feel like eating it may be helpful to make a smoothie as an alternative / liquid nutritional source, especially since you do not like nutritional supplement drinks       Psychosocial: Target Goals: Acknowledge presence or absence of significant depression and/or stress, maximize coping skills, provide positive support system. Participant is able to verbalize types and ability to use techniques and skills needed for reducing stress and depression.   Initial Review & Psychosocial Screening: Initial Psych Review & Screening - 10/11/18 1401      Initial Review   Current issues with  History of Depression;Current Stress Concerns    Source of Stress Concerns  Poor Coping Skills;Unable to participate in former interests or hobbies;Chronic Illness;Financial    Comments  Karlisha is hesistant to do much outside of her home. It is hard for her to get going during the day. She still has very little appetite and very low energy.       Family Dynamics  Good Support System?  Yes   she relies on her sister, but she is not in good health either. She did not mention any  of her children this time.      Screening Interventions   Interventions  Encouraged to exercise;Program counselor consult;To provide support and resources with identified psychosocial needs    Expected Outcomes  Short Term goal: Utilizing psychosocial counselor, staff and physician to assist with identification of specific Stressors or current issues interfering with healing process. Setting desired goal for each stressor or current issue identified.;Long Term Goal: Stressors or current issues are controlled or eliminated.;Short Term goal: Identification and review with participant of any Quality of Life or Depression concerns found by scoring the questionnaire.;Long Term goal: The participant improves quality of Life and PHQ9 Scores as seen by post scores and/or verbalization of changes       Quality of Life Scores:  Quality of Life - 10/11/18 1403      Quality of Life   Select  Quality of Life      Quality of Life Scores   Health/Function Pre  16.57 %    Socioeconomic Pre  23.5 %    Psych/Spiritual Pre  22.29 %    Family Pre  24.38 %    GLOBAL Pre  20.09 %      Scores of 19 and below usually indicate a poorer quality of life in these areas.  A difference of  2-3 points is a clinically meaningful difference.  A difference of 2-3 points in the total score of the Quality of Life Index has been associated with significant improvement in overall quality of life, self-image, physical symptoms, and general health in studies assessing change in quality of life.  PHQ-9: Recent Review Flowsheet Data    Depression screen [facility] 2/9 10/11/2018 05/09/2018 12/28/2017   Decreased Interest '3 2 2   '$ Down, Depressed, Hopeless '3 2 2   '$ PHQ - 2 Score '6 4 4   '$ Altered sleeping 0 2 2   Tired, decreased energy '3 2 2   '$ Change in appetite '3 2 3   '$ Feeling bad or failure about yourself  2 3 0   Trouble concentrating '3 2 1   '$ Moving slowly or fidgety/restless 0 1 0   Suicidal thoughts 0 0 0   PHQ-9 Score '17 16 12    '$ Difficult doing work/chores Very difficult Somewhat difficult Not difficult at all     Interpretation of Total Score  Total Score Depression Severity:  1-4 = Minimal depression, 5-9 = Mild depression, 10-14 = Moderate depression, 15-19 = Moderately severe depression, 20-27 = Severe depression   Psychosocial Evaluation and Intervention: Psychosocial Evaluation - 10/17/18 1619      Psychosocial Evaluation & Interventions   Interventions  Stress management education;Relaxation education;Encouraged to exercise with the program and follow exercise prescription    Comments  Counselor met with Ms. Keturah Barre Stanton Kidney) today for her return to Cardiac Rehab after 6 months - due to a mild heart attack and stent insertion.  Norrine will be 57 years old this weekend and has a strong support system with (4) adult children and a significant other.  She reports sleeping "better" with the use of a prescription med but doesn't have much of an appetite.  She a history of depression and reports taking meds to help with this.  Her PHQ-9 scores are 17 indicating moderately severe symptoms of depression are present.  She has multiple stressors with her health; being disabled and  finances.  Maquita has goals to "get heart healthy" and get back to church - where she had just returned the day she had her mild heart attack.  Counselor will follow.      Expected Outcomes  Short:  Tinaya will continue taking her mood medication and will contact her Dr. if her symptoms do not improve in the near future - with the use of consistent exercise.   Long:  Laqueena will continue to practice positive self-care for her health and mental health for an improved quality of life.    Continue Psychosocial Services   Follow up required by counselor       Psychosocial Re-Evaluation: Psychosocial Re-Evaluation    [place] Name 11/07/18 1655             Psychosocial Re-Evaluation   Current issues with  Current Psychotropic Meds       Comments  Counselor follow  up with Stanton Kidney today reporting she has begun two new medications - one for her heart and one for her mental health/stress and anxiety.  She reports both seem to be working well and she is sleeping better also.  She has less stress and anxiety and her mood is more positive as a result.  Brighten states the medication for her heart is helping her feel less fearful about having another heart attack and this confidence in turn helps her relax more.  Counselor commended [place] on her progress and positive self-care.       Expected Outcomes  Short:  Yeimi will take her medications as directed by her Drs.  She will continue to exercise for her health and mental health.  Long:  Loriel will develop routine healthy habits including exercise.         Continue Psychosocial Services   Follow up required by staff          Psychosocial Discharge (Final Psychosocial Re-Evaluation): Psychosocial Re-Evaluation - 11/07/18 1655      Psychosocial Re-Evaluation   Current issues with  Current Psychotropic Meds    Comments  Counselor follow up with Stanton Kidney today reporting she has begun two new medications - one for her heart and one for her mental health/stress and anxiety.  She reports both seem to be working well and she is sleeping better also.  She has less stress and anxiety and her mood is more positive as a result.  Hopie states the medication for her heart is helping her feel less fearful about having another heart attack and this confidence in turn helps her relax more.  Counselor commended Geary on her progress and positive self-care.    Expected Outcomes  Short:  Aleanna will take her medications as directed by her Drs.  She will continue to exercise for her health and mental health.  Long:  Anela will develop routine healthy habits including exercise.      Continue Psychosocial Services   Follow up required by staff       Vocational Rehabilitation: Provide vocational rehab assistance to qualifying candidates.   Vocational Rehab  Evaluation & Intervention: Vocational Rehab - 10/11/18 1400      Initial Vocational Rehab Evaluation & Intervention   Assessment shows need for Vocational Rehabilitation  No       Education: Education Goals: Education classes will be provided on a variety of topics geared toward better understanding of heart health and risk factor modification. Participant will state understanding/return demonstration of topics presented as noted by education test scores.  Learning Barriers/Preferences:  Learning Barriers/Preferences - 10/11/18 1348      Learning Barriers/Preferences   Learning Barriers  Exercise Concerns    Learning Preferences  Audio;Video       Education Topics:  AED/CPR: - Group verbal and written instruction with the use of models to demonstrate the basic use of the AED with the basic ABC's of resuscitation.   Cardiac Rehab from 05/09/2018 in [facility] Cardiac and Pulmonary Rehab  Date  05/02/18  Educator  CE  Instruction Review Code  1- Verbalizes Understanding      General Nutrition Guidelines/Fats and Fiber: -Group instruction provided by verbal, written material, models and posters to present the general guidelines for heart healthy nutrition. Gives an explanation and review of dietary fats and fiber.   Controlling Sodium/Reading Food Labels: -Group verbal and written material supporting the discussion of sodium use in heart healthy nutrition. Review and explanation with models, verbal and written materials for utilization of the food label.   Cardiac Rehab from 11/19/2018 in [facility] Cardiac and Pulmonary Rehab  Date  10/17/18  Educator  LB  Instruction Review Code  1- Verbalizes Understanding      Exercise Physiology & General Exercise Guidelines: - Group verbal and written instruction with models to review the exercise physiology of the cardiovascular system and associated critical values. Provides general exercise guidelines with specific guidelines to those with heart  or lung disease.    Cardiac Rehab from 05/09/2018 in [facility] Cardiac and Pulmonary Rehab  Date  03/21/18  Educator  [facility]  Instruction Review Code  1- Verbalizes Understanding      Aerobic Exercise & Resistance Training: - Gives group verbal and written instruction on the various components of exercise. Focuses on aerobic and resistive training programs and the benefits of this training and how to safely progress through these programs..   Cardiac Rehab from 05/09/2018 in [facility] Cardiac and Pulmonary Rehab  Date  03/26/18  Educator  [facility]  Instruction Review Code  1- Geologist, engineering, Balance, Mind/Body Relaxation: Provides group verbal/written instruction on the benefits of flexibility and balance training, including mind/body exercise modes such as yoga, pilates and tai chi.  Demonstration and skill practice provided.   Stress and Anxiety: - Provides group verbal and written instruction about the health risks of elevated stress and causes of high stress.  Discuss the correlation between heart/lung disease and anxiety and treatment options. Review healthy ways to manage with stress and anxiety.   Cardiac Rehab from 05/09/2018 in [facility] Cardiac and Pulmonary Rehab  Date  02/13/18  Educator  [facility]  Instruction Review Code  1- Verbalizes Understanding      Depression: - Provides group verbal and written instruction on the correlation between heart/lung disease and depressed mood, treatment options, and the stigmas associated with seeking treatment.   Cardiac Rehab from 05/09/2018 in [facility] Cardiac and Pulmonary Rehab  Date  03/28/18  Educator  [facility]  Instruction Review Code  1- Verbalizes Understanding      Anatomy & Physiology of the Heart: - Group verbal and written instruction and models provide basic cardiac anatomy and physiology, with the coronary electrical and arterial systems. Review of Valvular disease and Heart Failure   Cardiac Rehab from 05/09/2018 in [facility] Cardiac  and Pulmonary Rehab  Date  04/09/18  Educator  CE  Instruction Review Code  1- Verbalizes Understanding      Cardiac Procedures: - Group verbal and written instruction to review commonly prescribed medications for heart disease. Reviews  the medication, class of the drug, and side effects. Includes the steps to properly store meds and maintain the prescription regimen. (beta blockers and nitrates)   Cardiac Medications I: - Group verbal and written instruction to review commonly prescribed medications for heart disease. Reviews the medication, class of the drug, and side effects. Includes the steps to properly store meds and maintain the prescription regimen.   Cardiac Rehab from 05/09/2018 in [facility] Cardiac and Pulmonary Rehab  Date  04/16/18  Educator  CE  Instruction Review Code  1- Verbalizes Understanding      Cardiac Medications II: -Group verbal and written instruction to review commonly prescribed medications for heart disease. Reviews the medication, class of the drug, and side effects. (all other drug classes)   Cardiac Rehab from 05/09/2018 in [facility] Cardiac and Pulmonary Rehab  Date  02/15/18  Educator  [facility]  Instruction Review Code  1- Verbalizes Understanding       Go Sex-Intimacy & Heart Disease, Get SMART - Goal Setting: - Group verbal and written instruction through game format to discuss heart disease and the return to sexual intimacy. Provides group verbal and written material to discuss and apply goal setting through the application of the S.M.A.R.T. Method.   Other Matters of the Heart: - Provides group verbal, written materials and models to describe Stable Angina and Peripheral Artery. Includes description of the disease process and treatment options available to the cardiac patient.   Cardiac Rehab from 05/09/2018 in [facility] Cardiac and Pulmonary Rehab  Date  04/09/18  Educator  CE  Instruction Review Code  1- Verbalizes Understanding      Exercise & Equipment  Safety: - Individual verbal instruction and demonstration of equipment use and safety with use of the equipment.   Cardiac Rehab from 11/19/2018 in [facility] Cardiac and Pulmonary Rehab  Date  10/11/18  Educator  [facility]  Instruction Review Code  1- Verbalizes Understanding      Infection Prevention: - Provides verbal and written material to individual with discussion of infection control including proper hand washing and proper equipment cleaning during exercise session.   Cardiac Rehab from 11/19/2018 in [facility] Cardiac and Pulmonary Rehab  Date  10/11/18  Educator  [facility]  Instruction Review Code  1- Verbalizes Understanding      Falls Prevention: - Provides verbal and written material to individual with discussion of falls prevention and safety.   Cardiac Rehab from 11/19/2018 in [facility] Cardiac and Pulmonary Rehab  Date  10/11/18  Educator  [facility]  Instruction Review Code  1- Verbalizes Understanding      Diabetes: - Individual verbal and written instruction to review signs/symptoms of diabetes, desired ranges of glucose level fasting, after meals and with exercise. Acknowledge that pre and post exercise glucose checks will be done for 3 sessions at entry of program.   Cardiac Rehab from 11/19/2018 in [facility] Cardiac and Pulmonary Rehab  Date  10/11/18  Educator  [facility]  Instruction Review Code  1- Verbalizes Understanding      Know Your Numbers and Risk Factors: -Group verbal and written instruction about important numbers in your health.  Discussion of what are risk factors and how they play a role in the disease process.  Review of Cholesterol, Blood Pressure, Diabetes, and BMI and the role they play in your overall health.   Cardiac Rehab from 05/09/2018 in [facility] Cardiac and Pulmonary Rehab  Date  02/15/18  Educator  [facility]  Instruction Review Code  1- Verbalizes Understanding  Sleep Hygiene: -Provides group verbal and written instruction about how sleep can affect your health.  Define sleep  hygiene, discuss sleep cycles and impact of sleep habits. Review good sleep hygiene tips.    Cardiac Rehab from 11/19/2018 in [facility] Cardiac and Pulmonary Rehab  Date  10/24/18  Educator  Lucianne Lei, MSW  Instruction Review Code  1- Verbalizes Understanding      Other: -Provides group and verbal instruction on various topics (see comments)   Knowledge Questionnaire Score: Knowledge Questionnaire Score - 10/11/18 1359      Knowledge Questionnaire Score   Pre Score  21/26   correct answers reviewed with [facility] [facility]. Focus on nutrition and exercise.       Core Components/Risk Factors/Patient Goals at Admission: Personal Goals and Risk Factors at Admission - 10/11/18 1347      Core Components/Risk Factors/Patient Goals on Admission    Weight Management  Yes;Weight Maintenance    Intervention  Weight Management: Develop a combined nutrition and exercise program designed to reach desired caloric intake, while maintaining appropriate intake of nutrient and fiber, sodium and fats, and appropriate energy expenditure required for the weight goal.;Weight Management: Provide education and appropriate resources to help participant work on and attain dietary goals.    Expected Outcomes  Short Term: Continue to assess and modify interventions until short term weight is achieved;Long Term: Adherence to nutrition and physical activity/exercise program aimed toward attainment of established weight goal;Weight Maintenance: Understanding of the daily nutrition guidelines, which includes 25-35% calories from fat, 7% or less cal from saturated fats, less than '200mg'$  cholesterol, less than 1.5gm of sodium, & 5 or more servings of fruits and vegetables daily;Understanding recommendations for meals to include 15-35% energy as protein, 25-35% energy from fat, 35-60% energy from carbohydrates, less than '200mg'$  of dietary cholesterol, 20-35 gm of total fiber daily;Understanding of distribution of calorie intake throughout the  day with the consumption of 4-5 meals/snacks    Diabetes  Yes    Intervention  Provide education about signs/symptoms and action to take for hypo/hyperglycemia.;Provide education about proper nutrition, including hydration, and aerobic/resistive exercise prescription along with prescribed medications to achieve blood glucose in normal ranges: Fasting glucose 65-99 mg/dL    Expected Outcomes  Long Term: Attainment of HbA1C < 7%.;Short Term: Participant verbalizes understanding of the signs/symptoms and immediate care of hyper/hypoglycemia, proper foot care and importance of medication, aerobic/resistive exercise and nutrition plan for blood glucose control.    Hypertension  Yes    Intervention  Provide education on lifestyle modifcations including regular physical activity/exercise, weight management, moderate sodium restriction and increased consumption of fresh fruit, vegetables, and low fat dairy, alcohol moderation, and smoking cessation.;Monitor prescription use compliance.    Expected Outcomes  Short Term: Continued assessment and intervention until BP is < 140/34m HG in hypertensive participants. < 130/810mHG in hypertensive participants with diabetes, heart failure or chronic kidney disease.;Long Term: Maintenance of blood pressure at goal levels.    Lipids  Yes    Intervention  Provide education and support for participant on nutrition & aerobic/resistive exercise along with prescribed medications to achieve LDL '70mg'$ , HDL >'40mg'$ .    Expected Outcomes  Short Term: Participant states understanding of desired cholesterol values and is compliant with medications prescribed. Participant is following exercise prescription and nutrition guidelines.;Long Term: Cholesterol controlled with medications as prescribed, with individualized exercise RX and with personalized nutrition plan. Value goals: LDL < '70mg'$ , HDL > 40 mg.       Core Components/Risk Factors/Patient  Goals Review:    Core  Components/Risk Factors/Patient Goals at Discharge (Final Review):    ITP Comments: ITP Comments    Row Name 10/11/18 1339 10/17/18 0611 11/01/18 1244 11/12/18 1557 11/14/18 0620   ITP Comments  Med Review completed. Initial ITP created. Diagnosis can be found in [facility] 10/14  30 day review. Continue with ITP unless direccted changes per Medical Director Chart Review.  Patient called to inform us that her stress test went well and she will be back on 11/05/18.  Patient states she is having a procedure for her heart and will return to [facility] with a doctors note.  30 day review. Continue with ITP unless direccted changes per Medical Director Chart Review.  OUt for medical reason   Row Name 11/19/18 1746 12/11/18 0650         ITP Comments  Post exercise blood glucose was 79 mg/dL. Patient was given 4 glucose tabs and blood glucose was 110 mg/dL. Patient was offered PB crackers but refused and said she was going home to eat dinner.   30 Day Review. Continue with ITP unless directed changes per Medical Director review.  Only one visit this month         Comments:

## 2018-12-13 ENCOUNTER — Encounter: Payer: Medicare (Managed Care) | Attending: Cardiovascular Disease

## 2018-12-13 DIAGNOSIS — I214 Non-ST elevation (NSTEMI) myocardial infarction: Secondary | ICD-10-CM | POA: Insufficient documentation

## 2018-12-13 DIAGNOSIS — Z955 Presence of coronary angioplasty implant and graft: Secondary | ICD-10-CM | POA: Insufficient documentation

## 2018-12-17 DIAGNOSIS — Z955 Presence of coronary angioplasty implant and graft: Secondary | ICD-10-CM | POA: Diagnosis not present

## 2018-12-17 DIAGNOSIS — I214 Non-ST elevation (NSTEMI) myocardial infarction: Secondary | ICD-10-CM | POA: Diagnosis not present

## 2018-12-17 DIAGNOSIS — Z951 Presence of aortocoronary bypass graft: Secondary | ICD-10-CM

## 2018-12-17 LAB — GLUCOSE, CAPILLARY: GLUCOSE-CAPILLARY: 135 mg/dL — AB (ref 70–99)

## 2018-12-17 NOTE — Progress Notes (Signed)
Daily Session Note  Patient Details  Name: Kayla Malone MRN: [id number] Date of Birth: 1961/08/19 Referring Provider:     Cardiac Rehab from 10/11/2018 in [facility] Cardiac and Pulmonary Rehab  Referring Provider  [name] Date: 12/17/2018  Check In:      Social History   Tobacco Use  Smoking Status Former Smoker  . Last attempt to quit: 09/13/1998  . Years since quitting: 20.2  Smokeless Tobacco Never Used    Goals Met:  Independence with exercise equipment Exercise tolerated well No report of cardiac concerns or symptoms Strength training completed today  Goals Unmet:  Not Applicable  Comments: Pt able to follow exercise prescription today without complaint.  Will continue to monitor for progression.    Dr. Emily Filbert is Medical Director for [place] and [name].

## 2018-12-18 ENCOUNTER — Encounter: Payer: Self-pay | Admitting: Dietician

## 2018-12-19 ENCOUNTER — Encounter: Payer: Medicare (Managed Care) | Admitting: *Deleted

## 2018-12-19 DIAGNOSIS — Z955 Presence of coronary angioplasty implant and graft: Secondary | ICD-10-CM | POA: Diagnosis not present

## 2018-12-19 DIAGNOSIS — I214 Non-ST elevation (NSTEMI) myocardial infarction: Secondary | ICD-10-CM

## 2018-12-19 NOTE — Progress Notes (Signed)
Daily Session Note  Patient Details  Name: Kayla Malone MRN: [id number] Date of Birth: 1961-08-24 Referring Provider:     Cardiac Rehab from 10/11/2018 in [facility] Cardiac and Pulmonary Rehab  Referring Provider  [name] Date: 12/19/2018  Check In:      Social History   Tobacco Use  Smoking Status Former Smoker  . Last attempt to quit: 09/13/1998  . Years since quitting: 20.2  Smokeless Tobacco Never Used    Goals Met:  Proper associated with RPD/PD & O2 Sat Independence with exercise equipment Strength training completed today  Goals Unmet:  Not Applicable  Comments:     Dr. Emily Filbert is Medical Director for [place] and [name].

## 2018-12-24 DIAGNOSIS — Z955 Presence of coronary angioplasty implant and graft: Secondary | ICD-10-CM

## 2018-12-24 DIAGNOSIS — Z951 Presence of aortocoronary bypass graft: Secondary | ICD-10-CM

## 2018-12-24 DIAGNOSIS — I214 Non-ST elevation (NSTEMI) myocardial infarction: Secondary | ICD-10-CM

## 2018-12-24 NOTE — Progress Notes (Signed)
Daily Session Note  Patient Details  Name: Kayla Malone MRN: [id number] Date of Birth: 23-Feb-1961 Referring Provider:     Cardiac Rehab from 10/11/2018 in [facility] Cardiac and Pulmonary Rehab  Referring Provider  [name] Date: 12/24/2018  Check In:      Social History   Tobacco Use  Smoking Status Former Smoker  . Last attempt to quit: 09/13/1998  . Years since quitting: 20.2  Smokeless Tobacco Never Used    Goals Met:  Independence with exercise equipment Exercise tolerated well No report of cardiac concerns or symptoms Strength training completed today  Goals Unmet:  Not Applicable  Comments: Reviewed home exercise with pt today.  Pt plans to join [employer] for exercise.  Reviewed THR, pulse, RPE, sign and symptoms, NTG use, and when to call 911 or MD.  Also discussed weather considerations and indoor options.  Pt voiced understanding.    Dr. Emily Filbert is Medical Director for [place] and [name].

## 2019-01-02 ENCOUNTER — Encounter: Payer: Medicare (Managed Care) | Admitting: *Deleted

## 2019-01-02 DIAGNOSIS — Z955 Presence of coronary angioplasty implant and graft: Secondary | ICD-10-CM

## 2019-01-02 DIAGNOSIS — I214 Non-ST elevation (NSTEMI) myocardial infarction: Secondary | ICD-10-CM

## 2019-01-02 NOTE — Progress Notes (Signed)
Daily Session Note  Patient Details  Name: DIMPLE BASTYR MRN: [id number] Date of Birth: 1961-09-26 Referring Provider:     Cardiac Rehab from 10/11/2018 in [facility] Cardiac and Pulmonary Rehab  Referring Provider  Humphrey Rolls      Encounter Date: 01/02/2019  Check In: Session Check In - 01/02/19 1722      Check-In   Supervising physician immediately available to respond to emergencies  See telemetry face sheet for immediately available ER MD    Location  [facility]-Cardiac & Pulmonary Rehab    Staff Present  Renita Papa, RN BSN;Carroll Enterkin, RN, Vickki Hearing, BA, ACSM CEP, Exercise Physiologist    Medication changes reported      No    Fall or balance concerns reported     No    Tobacco Cessation  No Change    Warm-up and Cool-down  Performed as group-led instruction    Resistance Training Performed  Yes    VAD Patient?  No    PAD/SET Patient?  No      Pain Assessment   Currently in Pain?  No/denies          Social History   Tobacco Use  Smoking Status Former Smoker  . Last attempt to quit: 09/13/1998  . Years since quitting: 20.3  Smokeless Tobacco Never Used    Goals Met:  Independence with exercise equipment Exercise tolerated well No report of cardiac concerns or symptoms Strength training completed today  Goals Unmet:  Not Applicable  Comments: Pt able to follow exercise prescription today without complaint.  Will continue to monitor for progression.    Dr. Emily Filbert is Medical Director for [place] and [name].

## 2019-01-03 DIAGNOSIS — Z955 Presence of coronary angioplasty implant and graft: Secondary | ICD-10-CM | POA: Diagnosis not present

## 2019-01-03 DIAGNOSIS — I214 Non-ST elevation (NSTEMI) myocardial infarction: Secondary | ICD-10-CM

## 2019-01-03 NOTE — Progress Notes (Signed)
Daily Session Note  Patient Details  Name: DAZIYA REDMOND MRN: [id number] Date of Birth: [birth year] Referring Provider:     Cardiac Rehab from 10/11/2018 in [facility] Cardiac and Pulmonary Rehab  Referring Provider  [name] Date: 01/03/2019  Check In: Session Check In - 01/03/19 1654      Check-In   Supervising physician immediately available to respond to emergencies  [name] immediately available ER MD    Location  [facility]-Cardiac & Pulmonary Rehab    Staff Present  Justin Mend RCP,RRT,BSRT;Meredith Sherryll Burger, RN Moises Blood, BS, ACSM CEP, Exercise Physiologist    Medication changes reported      No    Fall or balance concerns reported     No    Warm-up and Cool-down  Performed as group-led instruction    Resistance Training Performed  Yes    VAD Patient?  No    PAD/SET Patient?  No      Pain Assessment   Currently in Pain?  No/denies          Social History   Tobacco Use  Smoking Status Former Smoker  . Last attempt to quit: 09/13/1998  . Years since quitting: 20.3  Smokeless Tobacco Never Used    Goals Met:  Independence with exercise equipment Exercise tolerated well No report of cardiac concerns or symptoms Strength training completed today  Goals Unmet:  Not Applicable  Comments: Pt able to follow exercise prescription today without complaint.  Will continue to monitor for progression.    Dr. Emily Filbert is Medical Director for [place] and [name].

## 2019-01-07 ENCOUNTER — Encounter: Payer: Medicare (Managed Care) | Admitting: *Deleted

## 2019-01-07 DIAGNOSIS — Z951 Presence of aortocoronary bypass graft: Secondary | ICD-10-CM

## 2019-01-07 DIAGNOSIS — I214 Non-ST elevation (NSTEMI) myocardial infarction: Secondary | ICD-10-CM

## 2019-01-07 DIAGNOSIS — Z955 Presence of coronary angioplasty implant and graft: Secondary | ICD-10-CM | POA: Diagnosis not present

## 2019-01-07 NOTE — Progress Notes (Signed)
Daily Session Note  Patient Details  Name: Kayla Malone MRN: [id number] Date of Birth: 01/28/1961 Referring Provider:     Cardiac Rehab from 10/11/2018 in [facility] Cardiac and Pulmonary Rehab  Referring Provider  Humphrey Rolls      Encounter Date: 01/07/2019  Check In: Session Check In - 01/07/19 1718      Check-In   Supervising physician immediately available to respond to emergencies  See telemetry face sheet for immediately available ER MD    Location  [facility]-Cardiac & Pulmonary Rehab    Staff Present  Earlean Shawl, BS, ACSM CEP, Exercise Physiologist;Carroll Enterkin, RN, Vickki Hearing, BA, ACSM CEP, Exercise Physiologist    Medication changes reported      No    Fall or balance concerns reported     No    Tobacco Cessation  No Change    Warm-up and Cool-down  Performed as group-led instruction    Resistance Training Performed  Yes    VAD Patient?  No    PAD/SET Patient?  No      Pain Assessment   Currently in Pain?  No/denies    Multiple Pain Sites  No          Social History   Tobacco Use  Smoking Status Former Smoker  . Last attempt to quit: 09/13/1998  . Years since quitting: 20.3  Smokeless Tobacco Never Used    Goals Met:  Independence with exercise equipment Exercise tolerated well No report of cardiac concerns or symptoms Strength training completed today  Goals Unmet:  Not Applicable  Comments: Pt able to follow exercise prescription today without complaint.  Will continue to monitor for progression.    Dr. Emily Filbert is Medical Director for [place] and [name].

## 2019-01-09 ENCOUNTER — Encounter: Payer: Self-pay | Admitting: *Deleted

## 2019-01-09 DIAGNOSIS — I214 Non-ST elevation (NSTEMI) myocardial infarction: Secondary | ICD-10-CM

## 2019-01-09 DIAGNOSIS — Z955 Presence of coronary angioplasty implant and graft: Secondary | ICD-10-CM

## 2019-01-09 NOTE — Progress Notes (Signed)
Cardiac Individual Treatment Plan  Patient Details  Name: Kayla Malone MRN: [id number] Date of Birth: Feb 23, 1961 Referring Provider:     Cardiac Rehab from 10/11/2018 in [facility] Cardiac and Pulmonary Rehab  Referring Provider  Humphrey Rolls      Initial Encounter Date:    Cardiac Rehab from 10/11/2018 in [facility] Cardiac and Pulmonary Rehab  Date  10/11/18      Visit Diagnosis: NSTEMI (non-ST elevated myocardial infarction) [facility])  Status post coronary artery stent placement  Patient's Home Medications on Admission:  Current Outpatient Medications:  .  acetaminophen (TYLENOL) 500 MG tablet, Take 1,000 mg by mouth every 6 (six) hours as needed for mild pain or headache., Disp: , Rfl:  .  albuterol (ACCUNEB) 0.63 MG/3ML nebulizer solution, Take 1 ampule by nebulization every 6 (six) hours as needed for wheezing., Disp: , Rfl:  .  albuterol (PROVENTIL HFA;VENTOLIN HFA) 108 (90 Base) MCG/ACT inhaler, Inhale 2 puffs into the lungs every 6 (six) hours as needed for wheezing or shortness of breath. , Disp: , Rfl:  .  aspirin EC 81 MG tablet, Take 81 mg by mouth daily., Disp: , Rfl:  .  atorvastatin (LIPITOR) 40 MG tablet, Take 80 mg by mouth daily. , Disp: , Rfl:  .  fluticasone (FLONASE) 50 MCG/ACT nasal spray, Place 2 sprays into both nostrils daily., Disp: , Rfl:  .  Fluticasone-Salmeterol (ADVAIR DISKUS) 500-50 MCG/DOSE AEPB, Inhale 1 puff into the lungs daily as needed (SOB). , Disp: , Rfl:  .  losartan (COZAAR) 50 MG tablet, Take 50 mg by mouth daily., Disp: , Rfl: 2 .  metFORMIN (GLUCOPHAGE) 500 MG tablet, Take 1,000 mg by mouth every morning. , Disp: , Rfl: 5 .  metFORMIN (GLUCOPHAGE) 500 MG tablet, Take 500 mg by mouth every evening., Disp: , Rfl:  .  metoprolol succinate (TOPROL-XL) 50 MG 24 hr tablet, Take 50 mg by mouth daily. Take with or immediately following a meal., Disp: , Rfl:  .  nitroGLYCERIN (NITROSTAT) 0.4 MG SL tablet, Place 1 tablet (0.4 mg total) under the tongue every 5  (five) minutes x 3 doses as needed for chest pain., Disp: 25 tablet, Rfl: 0 .  pantoprazole (PROTONIX) 40 MG tablet, Take 40 mg by mouth. , Disp: , Rfl:  .  sitaGLIPtin (JANUVIA) 50 MG tablet, Take 50 mg by mouth daily., Disp: , Rfl:  .  ticagrelor (BRILINTA) 90 MG TABS tablet, Take 1 tablet (90 mg total) by mouth 2 (two) times daily., Disp: 30 tablet, Rfl: 0 .  traZODone (DESYREL) 50 MG tablet, Take 50 mg by mouth at bedtime., Disp: , Rfl: 5 .  VOLTAREN 1 % GEL, Apply 2 g topically as needed (pain in shoulder). , Disp: , Rfl: 2  Past Medical History: Past Medical History:  Diagnosis Date  . Asthma   . Coronary artery disease   . Depression   . Diabetes mellitus without complication ([place])   . GERD (gastroesophageal reflux disease)   . History of hiatal hernia   . Hypertension   . Pain    BACK  . Stroke ([place])    x3  . Thyroid cyst     Tobacco Use: Social History   Tobacco Use  Smoking Status Former Smoker  . Last attempt to quit: 09/13/1998  . Years since quitting: 20.3  Smokeless Tobacco Never Used    Labs: Recent Review Scientist, physiological    Labs for ITP Cardiac and Pulmonary Rehab Latest Ref Rng & Units 10/23/2017 10/23/2017  10/23/2017 10/23/2017 10/24/2017   Hemoglobin A1c 4.8 - 5.6 % 7.3(H) - - - -   PHART 7.350 - 7.450 - 7.359 7.402 - -   PCO2ART 32.0 - 48.0 mmHg - 44.0 41.4 - -   HCO3 20.0 - 28.0 mmol/L - 25.1 26.0 - -   TCO2 22 - 32 mmol/L - _0 ACIDBASEDEF 0.0 - 2.0 mmol/L - 1.0 - - -   O2SAT % - 99.0 99.0 - -       Exercise Target Goals: Exercise Program Goal: Individual exercise prescription set using results from initial 6 min walk test and THRR while considering  patient's activity barriers and safety.   Exercise Prescription Goal: Initial exercise prescription builds to 30-45 minutes a day of aerobic activity, 2-3 days per week.  Home exercise guidelines will be given to patient during program as part of exercise prescription that the participant  will acknowledge.  Activity Barriers & Risk Stratification:   6 Minute Walk: 6 Minute Walk    Row Name 10/11/18 1351         6 Minute Walk   Phase  Initial     Distance  700 feet     Walk Time  5.75 minutes     # of Rest Breaks  1     MPH  1.32     METS  2.72     RPE  12     Perceived Dyspnea   1     VO2 Peak  9.5     Symptoms  Yes (comment)     Comments  foot drag when she fatigues     Resting HR  72 bpm     Resting BP  148/82     Resting Oxygen Saturation   97 %     Exercise Oxygen Saturation  during 6 min walk  98 %     Max Ex. HR  95 bpm     Max Ex. BP  150/78     2 Minute Post BP  130/78        Oxygen Initial Assessment:   Oxygen Re-Evaluation:   Oxygen Discharge (Final Oxygen Re-Evaluation):   Initial Exercise Prescription: Initial Exercise Prescription - 10/11/18 1300      Date of Initial Exercise RX and Referring Provider   Date  10/11/18    Referring Provider  Humphrey Rolls      NuStep   Level  1    SPM  80    Minutes  15    METs  2.7      Biostep-RELP   Level  2    SPM  50    Minutes  15    METs  2      Track   Laps  25    Minutes  15    METs  2.15      Prescription Details   Frequency (times per week)  3    Duration  Progress to 45 minutes of aerobic exercise without signs/symptoms of physical distress      Intensity   THRR 40-80% of Max Heartrate  108-144    Ratings of Perceived Exertion  11-13    Perceived Dyspnea  0-4      Resistance Training   Training Prescription  Yes    Weight  3 lb    Reps  10-15       Perform Capillary Blood Glucose checks as needed.  Exercise Prescription Changes: Exercise Prescription  Changes    Row Name 10/11/18 1400 10/24/18 1100 11/12/18 1400 11/21/18 1100 12/19/18 1300     Response to Exercise   Blood Pressure (Admit)  148/82  140/80  130/62  122/60  110/58   Blood Pressure (Exercise)  150/78  140/80  120/68  148/84  126/62   Blood Pressure (Exit)  130/78  124/74  120/62  128/78  124/78    Heart Rate (Admit)  72 bpm  75 bpm  64 bpm  80 bpm  70 bpm   Heart Rate (Exercise)  95 bpm  96 bpm  92 bpm  103 bpm  88 bpm   Heart Rate (Exit)  63 bpm  74 bpm  63 bpm  63 bpm  55 bpm   Rating of Perceived Exertion (Exercise)  '12  14  15  13  15   '$ Perceived Dyspnea (Exercise)  1  -  -  -  -   Symptoms  foot drag  -  -  -  -   Comments  -  first session  -  -  -   Duration  -  Progress to 45 minutes of aerobic exercise without signs/symptoms of physical distress  Progress to 45 minutes of aerobic exercise without signs/symptoms of physical distress  Progress to 45 minutes of aerobic exercise without signs/symptoms of physical distress  Progress to 45 minutes of aerobic exercise without signs/symptoms of physical distress   Intensity  -  THRR unchanged  THRR unchanged  THRR unchanged  Other (comment) use RPE     Progression   Progression  -  Continue to progress workloads to maintain intensity without signs/symptoms of physical distress.  Continue to progress workloads to maintain intensity without signs/symptoms of physical distress.  Continue to progress workloads to maintain intensity without signs/symptoms of physical distress.  Continue to progress workloads to maintain intensity without signs/symptoms of physical distress.   Average METs  -  2  2  2.6  3     Resistance Training   Training Prescription  -  Yes  Yes  Yes  Yes   Weight  -  3 lb  3 lb  3 lb  3 lb   Reps  -  10-15  10-15  10-15  10-15     Interval Training   Interval Training  -  No  No  No  No     NuStep   Level  -  -  -  3  3   SPM  -  -  -  80  80   Minutes  -  -  -  15  15   METs  -  -  -  3.2  3     Biostep-RELP   Level  -  '2  2  2  '$ -   SPM  -  50  50  50  -   Minutes  -  '15  30  15  '$ -   METs  -  '2  2  2  '$ -   Row Name 01/03/19 0800             Response to Exercise   Blood Pressure (Admit)  126/60       Blood Pressure (Exercise)  136/60       Blood Pressure (Exit)  120/66       Heart Rate (Admit)  55 bpm        Heart Rate (Exercise)  119 bpm  Heart Rate (Exit)  67 bpm       Rating of Perceived Exertion (Exercise)  12       Duration  Progress to 45 minutes of aerobic exercise without signs/symptoms of physical distress       Intensity  Other (comment) use RPE         Progression   Progression  Continue to progress workloads to maintain intensity without signs/symptoms of physical distress.       Average METs  3         Resistance Training   Training Prescription  Yes       Weight  3 lb       Reps  10-15         Interval Training   Interval Training  No         NuStep   Level  3       SPM  80       Minutes  15       METs  3.2         Biostep-RELP   Level  2       SPM  50       Minutes  15       METs  3          Exercise Comments: Exercise Comments    Row Name 12/24/18 1705           Exercise Comments  Reviewed home exercise with pt today.  Pt plans to join [employer] for exercise.  Reviewed THR, pulse, RPE, sign and symptoms, NTG use, and when to call 911 or MD.  Also discussed weather considerations and indoor options.  Pt voiced understanding.          Exercise Goals and Review: Exercise Goals    Row Name 10/11/18 1350             Exercise Goals   Increase Physical Activity  Yes       Intervention  Provide advice, education, support and counseling about physical activity/exercise needs.;Develop an individualized exercise prescription for aerobic and resistive training based on initial evaluation findings, risk stratification, comorbidities and participant's personal goals.       Expected Outcomes  Short Term: Attend rehab on a regular basis to increase amount of physical activity.;Long Term: Add in home exercise to make exercise part of routine and to increase amount of physical activity.;Long Term: Exercising regularly at least 3-5 days a week.       Increase Strength and Stamina  Yes       Intervention  Provide advice, education, support and counseling  about physical activity/exercise needs.;Develop an individualized exercise prescription for aerobic and resistive training based on initial evaluation findings, risk stratification, comorbidities and participant's personal goals.       Expected Outcomes  Short Term: Increase workloads from initial exercise prescription for resistance, speed, and METs.;Short Term: Perform resistance training exercises routinely during rehab and add in resistance training at home;Long Term: Improve cardiorespiratory fitness, muscular endurance and strength as measured by increased METs and functional capacity (6MWT)       Able to understand and use rate of perceived exertion (RPE) scale  Yes       Intervention  Provide education and explanation on how to use RPE scale       Expected Outcomes  Short Term: Able to use RPE daily in rehab to express subjective intensity level;Long Term:  Able to use RPE  to guide intensity level when exercising independently       Knowledge and understanding of Target Heart Rate Range (THRR)  Yes       Intervention  Provide education and explanation of THRR including how the numbers were predicted and where they are located for reference       Expected Outcomes  Short Term: Able to state/look up THRR;Short Term: Able to use daily as guideline for intensity in rehab;Long Term: Able to use THRR to govern intensity when exercising independently       Able to check pulse independently  Yes       Intervention  Provide education and demonstration on how to check pulse in carotid and radial arteries.;Review the importance of being able to check your own pulse for safety during independent exercise       Expected Outcomes  Short Term: Able to explain why pulse checking is important during independent exercise;Long Term: Able to check pulse independently and accurately       Understanding of Exercise Prescription  Yes       Intervention  Provide education, explanation, and written materials on patient's  individual exercise prescription       Expected Outcomes  Short Term: Able to explain program exercise prescription;Long Term: Able to explain home exercise prescription to exercise independently          Exercise Goals Re-Evaluation : Exercise Goals Re-Evaluation    Row Name 11/12/18 1504 11/21/18 1130 12/19/18 1349 12/24/18 1705 01/03/19 0815     Exercise Goal Re-Evaluation   Exercise Goals Review  Increase Physical Activity;Increase Strength and Stamina;Able to understand and use rate of perceived exertion (RPE) scale;Knowledge and understanding of Target Heart Rate Range (THRR)  Increase Physical Activity;Increase Strength and Stamina;Able to understand and use rate of perceived exertion (RPE) scale;Knowledge and understanding of Target Heart Rate Range (THRR)  Increase Physical Activity;Increase Strength and Stamina;Able to understand and use rate of perceived exertion (RPE) scale  Increase Physical Activity;Able to understand and use rate of perceived exertion (RPE) scale;Knowledge and understanding of Target Heart Rate Range (THRR);Understanding of Exercise Prescription;Increase Strength and Stamina  Increase Physical Activity;Increase Strength and Stamina;Able to understand and use rate of perceived exertion (RPE) scale;Knowledge and understanding of Target Heart Rate Range (THRR)   Comments  Kayla Malone has only attended 4 sessions this month.  She would progress more with consistent attendance.    Kayla Malone did well on the NS.  She still needs to attend consistently to see progress.  Kayla Malone only attended HT once in December.  More consistent attendance is necessary for her to progress.  Reviewed home exercise with pt today.  Pt plans to join [employer] for exercise.  Reviewed THR, pulse, RPE, sign and symptoms, NTG use, and when to call 911 or MD.  Also discussed weather considerations and indoor options.  Pt voiced understanding.  Kayla Malone tolerates exercise well when she attends.  More consistent attendance  would yield better progress.   Expected Outcomes  Short - attend consistently 3 times per week Long - increase overall MET level  Short - attend three days per week Long - increase MET level  Short - attend HT 3 times per week Long - improve MET level and strength  Short - add one day per week exercise at home Long - continue exercise in [employer] - attend consistently Long - increase MET level      Discharge Exercise Prescription (Final Exercise Prescription Changes): Exercise Prescription  Changes - 01/03/19 0800      Response to Exercise   Blood Pressure (Admit)  126/60    Blood Pressure (Exercise)  136/60    Blood Pressure (Exit)  120/66    Heart Rate (Admit)  55 bpm    Heart Rate (Exercise)  119 bpm    Heart Rate (Exit)  67 bpm    Rating of Perceived Exertion (Exercise)  12    Duration  Progress to 45 minutes of aerobic exercise without signs/symptoms of physical distress    Intensity  Other (comment)   use RPE     Progression   Progression  Continue to progress workloads to maintain intensity without signs/symptoms of physical distress.    Average METs  3      Resistance Training   Training Prescription  Yes    Weight  3 lb    Reps  10-15      Interval Training   Interval Training  No      NuStep   Level  3    SPM  80    Minutes  15    METs  3.2      Biostep-RELP   Level  2    SPM  50    Minutes  15    METs  3       Nutrition:  Target Goals: Understanding of nutrition guidelines, daily intake of sodium '1500mg'$ , cholesterol '200mg'$ , calories 65% from fat and 7% or less from saturated fats, daily to have 5 or more servings of fruits and vegetables.  Biometrics: Pre Biometrics - 10/11/18 1350      Pre Biometrics   Height  5' 4.25" (1.632 m)    Weight  146 lb 14.4 oz (66.6 kg)    Waist Circumference  32 inches    Hip Circumference  36.5 inches    Waist to Hip Ratio  0.88 %    BMI (Calculated)  25.02        Nutrition Therapy Plan and Nutrition  Goals: Nutrition Therapy & Goals - 11/19/18 1641      Nutrition Therapy   Diet  DM    Drug/Food Interactions  Statins/Certain Fruits    Protein (specify units)  9-10oz    Fiber  20 grams    Whole Grain Foods  3 servings   chooses some whole grains IE oatmeal   Saturated Fats  12 max. grams    Fruits and Vegetables  5 servings/day   8 ideal; likes fruits and vegetables but has a poor appetite most days   Sodium  1500 grams      Personal Nutrition Goals   Nutrition Goal  Try adding marinades, different seasonings/ herbs/ spices to foods to improve taste profile. Foods that have aromas like soup may help stimulate appetite as well    Personal Goal #2  On days you do not feel like eating it may be helpful to make a smoothie as an alternative/ liquid nutritional source, espeically since you do not like nutritional supplement drinks    Comments  She c/o severe decline in appetite after losing her sense of smell/taste s/p mini stroke. She has been losing wt, approx 10# since her last surgery date. Currently she eats 1-2x/day, with some days being better than others in terms of appetite and "having a taste for things." She is a diabetic with BG readings typically 90-100 recently. Foods she likes and can tolerate: fruits, cheese, most vegetables, baked potatoes, salad, dry cereal, hard  boiled eggs, oatmeal, soup. Does not usually eat breakfast. Drinks water and the occasional tea. She has tried nutritional drinks like Ensure and dislikes them.      Intervention Plan   Intervention  Prescribe, educate and counsel regarding individualized specific dietary modifications aiming towards targeted core components such as weight, hypertension, lipid management, diabetes, heart failure and other comorbidities.;Nutrition handout(s) given to patient.   Nutrition tips for taste changes handout; Smoothie recipes   Expected Outcomes  Short Term Goal: Understand basic principles of dietary content, such as calories,  fat, sodium, cholesterol and nutrients.;Short Term Goal: A plan has been developed with personal nutrition goals set during dietitian appointment.;Long Term Goal: Adherence to prescribed nutrition plan.       Nutrition Assessments: Nutrition Assessments - 10/11/18 1409      MEDFICTS Scores   Pre Score  0   no appetite. barely eats anything through out the day.       Nutrition Goals Re-Evaluation: Nutrition Goals Re-Evaluation    [place] Name 11/19/18 1656 12/18/18 0929           Goals   Nutrition Goal  Try adding marinades, different seasonings/ herbs/ spices to foods to improve taste profile. Foods that have aromas like soup may help stimulate appetite as well  Try adding marinades, seasonings, herbs or spices to foods to improve taste profile. Foods that have aromas like soup may be helpful to stimulate appetite as well; On days when you do not feel like eating try having a smoothie instead      Comment  She c/o lack of smell and taste leading to poor appetite and desire to eat  She continues to c/o lack of smell and taste but is trying to include more foods. She has purchased 100% fruit snacks for times that she feels she is having low blood sugar, as she enjoys them. She was educated on more nutritious options, and instructed to eat 1/2 of the pack of fruit snacks (~15-20g CHO) should she want to choose this as an option to correct a low blood sugar rather than eating the whole pack and overshooting (~50g CHO)      Expected Outcome  She will try different flavorings and cooking methods to help encourage her to eat and to prevent further wt loss  She will choose whole food options for meals and snacks most often to both meet her daily nutritional needs and best manage BG levels. Continue to experiment with food preparation methods that encourage appetite despite altered taste and smell        Personal Goal #2 Re-Evaluation   Personal Goal #2  On days you do not feel like eating it may be  helpful to make a smoothie as an alternative / liquid nutritional source, especially since you do not like nutritional supplement drinks  -         Nutrition Goals Discharge (Final Nutrition Goals Re-Evaluation): Nutrition Goals Re-Evaluation - 12/18/18 0929      Goals   Nutrition Goal  Try adding marinades, seasonings, herbs or spices to foods to improve taste profile. Foods that have aromas like soup may be helpful to stimulate appetite as well; On days when you do not feel like eating try having a smoothie instead    Comment  She continues to c/o lack of smell and taste but is trying to include more foods. She has purchased 100% fruit snacks for times that she feels she is having low blood sugar, as she  enjoys them. She was educated on more nutritious options, and instructed to eat 1/2 of the pack of fruit snacks (~15-20g CHO) should she want to choose this as an option to correct a low blood sugar rather than eating the whole pack and overshooting (~50g CHO)    Expected Outcome  She will choose whole food options for meals and snacks most often to both meet her daily nutritional needs and best manage BG levels. Continue to experiment with food preparation methods that encourage appetite despite altered taste and smell       Psychosocial: Target Goals: Acknowledge presence or absence of significant depression and/or stress, maximize coping skills, provide positive support system. Participant is able to verbalize types and ability to use techniques and skills needed for reducing stress and depression.   Initial Review & Psychosocial Screening: Initial Psych Review & Screening - 10/11/18 1401      Initial Review   Current issues with  History of Depression;Current Stress Concerns    Source of Stress Concerns  Poor Coping Skills;Unable to participate in former interests or hobbies;Chronic Illness;Financial    Comments  Kayla Malone is hesistant to do much outside of her home. It is hard for her to get  going during the day. She still has very little appetite and very low energy.       Family Dynamics   Good Support System?  Yes   she relies on her sister, but she is not in good health either. She did not mention any of her children this time.      Screening Interventions   Interventions  Encouraged to exercise;Program counselor consult;To provide support and resources with identified psychosocial needs    Expected Outcomes  Short Term goal: Utilizing psychosocial counselor, staff and physician to assist with identification of specific Stressors or current issues interfering with healing process. Setting desired goal for each stressor or current issue identified.;Long Term Goal: Stressors or current issues are controlled or eliminated.;Short Term goal: Identification and review with participant of any Quality of Life or Depression concerns found by scoring the questionnaire.;Long Term goal: The participant improves quality of Life and PHQ9 Scores as seen by post scores and/or verbalization of changes       Quality of Life Scores:  Quality of Life - 10/11/18 1403      Quality of Life   Select  Quality of Life      Quality of Life Scores   Health/Function Pre  16.57 %    Socioeconomic Pre  23.5 %    Psych/Spiritual Pre  22.29 %    Family Pre  24.38 %    GLOBAL Pre  20.09 %      Scores of 19 and below usually indicate a poorer quality of life in these areas.  A difference of  2-3 points is a clinically meaningful difference.  A difference of 2-3 points in the total score of the Quality of Life Index has been associated with significant improvement in overall quality of life, self-image, physical symptoms, and general health in studies assessing change in quality of life.  PHQ-9: Recent Review Flowsheet Data    Depression screen [facility] 2/9 10/11/2018 05/09/2018 12/28/2017   Decreased Interest '3 2 2   '$ Down, Depressed, Hopeless '3 2 2   '$ PHQ - 2 Score '6 4 4   '$ Altered sleeping 0 2 2   Tired,  decreased energy '3 2 2   '$ Change in appetite '3 2 3   '$ Feeling bad or  failure about yourself  2 3 0   Trouble concentrating '3 2 1   '$ Moving slowly or fidgety/restless 0 1 0   Suicidal thoughts 0 0 0   PHQ-9 Score '17 16 12   '$ Difficult doing work/chores Very difficult Somewhat difficult Not difficult at all     Interpretation of Total Score  Total Score Depression Severity:  1-4 = Minimal depression, 5-9 = Mild depression, 10-14 = Moderate depression, 15-19 = Moderately severe depression, 20-27 = Severe depression   Psychosocial Evaluation and Intervention: Psychosocial Evaluation - 10/17/18 1619      Psychosocial Evaluation & Interventions   Interventions  Stress management education;Relaxation education;Encouraged to exercise with the program and follow exercise prescription    Comments  Counselor met with Kayla Malone) today for her return to Cardiac Rehab after 6 months - due to a mild heart attack and stent insertion.  Kayla Malone will be 58 years old this weekend and has a strong support system with (4) adult children and a significant other.  She reports sleeping "better" with the use of a prescription med but doesn't have much of an appetite.  She a history of depression and reports taking meds to help with this.  Her PHQ-9 scores are 17 indicating moderately severe symptoms of depression are present.  She has multiple stressors with her health; being disabled and finances.  Kayla Malone has goals to "get heart healthy" and get back to church - where she had just returned the day she had her mild heart attack.  Counselor will follow.      Expected Outcomes  Short:  Kayla Malone will continue taking her mood medication and will contact her Dr. if her symptoms do not improve in the near future - with the use of consistent exercise.   Long:  Kayla Malone will continue to practice positive self-care for her health and mental health for an improved quality of life.    Continue Psychosocial Services   Follow up required by  counselor       Psychosocial Re-Evaluation: Psychosocial Re-Evaluation    [place] Name 11/07/18 1655 12/24/18 1650           Psychosocial Re-Evaluation   Current issues with  Current Psychotropic Meds  Current Stress Concerns;Current Sleep Concerns      Comments  Counselor follow up with Stanton Malone today reporting she has begun two new medications - one for her heart and one for her mental health/stress and anxiety.  She reports both seem to be working well and she is sleeping better also.  She has less stress and anxiety and her mood is more positive as a result.  Kayla Malone states the medication for her heart is helping her feel less fearful about having another heart attack and this confidence in turn helps her relax more.  Counselor commended Kayla Malone on her progress and positive self-care.  Kayla Malone is still concerned about her heart health.  Staff encouraged her to follow up with Dr about any concerns.  She reports sleeping "ok" about 5 hours per night.      Expected Outcomes  Short:  Kayla Malone will take her medications as directed by her Drs.  She will continue to exercise for her health and mental health.  Long:  Satcha will develop routine healthy habits including exercise.    Short - continue to attend Cardiac rehab Long - manage stress on her own      Interventions  -  Encouraged to attend Cardiac Rehabilitation for the exercise  Continue Psychosocial Services   Follow up required by staff  Follow up required by staff         Psychosocial Discharge (Final Psychosocial Re-Evaluation): Psychosocial Re-Evaluation - 12/24/18 1650      Psychosocial Re-Evaluation   Current issues with  Current Stress Concerns;Current Sleep Concerns    Comments  Kayla Malone is still concerned about her heart health.  Staff encouraged her to follow up with Dr about any concerns.  She reports sleeping "ok" about 5 hours per night.    Expected Outcomes  Short - continue to attend Cardiac rehab Long - manage stress on her own     Interventions  Encouraged to attend Cardiac Rehabilitation for the exercise    Continue Psychosocial Services   Follow up required by staff       Vocational Rehabilitation: Provide vocational rehab assistance to qualifying candidates.   Vocational Rehab Evaluation & Intervention: Vocational Rehab - 10/11/18 1400      Initial Vocational Rehab Evaluation & Intervention   Assessment shows need for Vocational Rehabilitation  No       Education: Education Goals: Education classes will be provided on a variety of topics geared toward better understanding of heart health and risk factor modification. Participant will state understanding/return demonstration of topics presented as noted by education test scores.  Learning Barriers/Preferences: Learning Barriers/Preferences - 10/11/18 1348      Learning Barriers/Preferences   Learning Barriers  Exercise Concerns    Learning Preferences  Audio;Video       Education Topics:  AED/CPR: - Group verbal and written instruction with the use of models to demonstrate the basic use of the AED with the basic ABC's of resuscitation.   Cardiac Rehab from 05/09/2018 in [facility] Cardiac and Pulmonary Rehab  Date  05/02/18  Educator  CE  Instruction Review Code  1- Verbalizes Understanding      General Nutrition Guidelines/Fats and Fiber: -Group instruction provided by verbal, written material, models and posters to present the general guidelines for heart healthy nutrition. Gives an explanation and review of dietary fats and fiber.   Controlling Sodium/Reading Food Labels: -Group verbal and written material supporting the discussion of sodium use in heart healthy nutrition. Review and explanation with models, verbal and written materials for utilization of the food label.   Cardiac Rehab from 01/02/2019 in [facility] Cardiac and Pulmonary Rehab  Date  10/17/18  Educator  LB  Instruction Review Code  1- Verbalizes Understanding      Exercise Physiology  & General Exercise Guidelines: - Group verbal and written instruction with models to review the exercise physiology of the cardiovascular system and associated critical values. Provides general exercise guidelines with specific guidelines to those with heart or lung disease.    Cardiac Rehab from 05/09/2018 in [facility] Cardiac and Pulmonary Rehab  Date  03/21/18  Educator  [facility]  Instruction Review Code  1- Verbalizes Understanding      Aerobic Exercise & Resistance Training: - Gives group verbal and written instruction on the various components of exercise. Focuses on aerobic and resistive training programs and the benefits of this training and how to safely progress through these programs..   Cardiac Rehab from 01/02/2019 in [facility] Cardiac and Pulmonary Rehab  Date  01/02/19  Educator  AS  Instruction Review Code  1- Verbalizes Understanding      Flexibility, Balance, Mind/Body Relaxation: Provides group verbal/written instruction on the benefits of flexibility and balance training, including mind/body exercise modes such as yoga, pilates and tai  chi.  Demonstration and skill practice provided.   Stress and Anxiety: - Provides group verbal and written instruction about the health risks of elevated stress and causes of high stress.  Discuss the correlation between heart/lung disease and anxiety and treatment options. Review healthy ways to manage with stress and anxiety.   Cardiac Rehab from 05/09/2018 in [facility] Cardiac and Pulmonary Rehab  Date  02/13/18  Educator  [facility]  Instruction Review Code  1- Verbalizes Understanding      Depression: - Provides group verbal and written instruction on the correlation between heart/lung disease and depressed mood, treatment options, and the stigmas associated with seeking treatment.   Cardiac Rehab from 05/09/2018 in [facility] Cardiac and Pulmonary Rehab  Date  03/28/18  Educator  [facility]  Instruction Review Code  1- Verbalizes Understanding      Anatomy &  Physiology of the Heart: - Group verbal and written instruction and models provide basic cardiac anatomy and physiology, with the coronary electrical and arterial systems. Review of Valvular disease and Heart Failure   Cardiac Rehab from 05/09/2018 in [facility] Cardiac and Pulmonary Rehab  Date  04/09/18  Educator  CE  Instruction Review Code  1- Verbalizes Understanding      Cardiac Procedures: - Group verbal and written instruction to review commonly prescribed medications for heart disease. Reviews the medication, class of the drug, and side effects. Includes the steps to properly store meds and maintain the prescription regimen. (beta blockers and nitrates)   Cardiac Medications I: - Group verbal and written instruction to review commonly prescribed medications for heart disease. Reviews the medication, class of the drug, and side effects. Includes the steps to properly store meds and maintain the prescription regimen.   Cardiac Rehab from 05/09/2018 in [facility] Cardiac and Pulmonary Rehab  Date  04/16/18  Educator  CE  Instruction Review Code  1- Verbalizes Understanding      Cardiac Medications II: -Group verbal and written instruction to review commonly prescribed medications for heart disease. Reviews the medication, class of the drug, and side effects. (all other drug classes)   Cardiac Rehab from 05/09/2018 in [facility] Cardiac and Pulmonary Rehab  Date  02/15/18  Educator  [facility]  Instruction Review Code  1- Verbalizes Understanding       Go Sex-Intimacy & Heart Disease, Get SMART - Goal Setting: - Group verbal and written instruction through game format to discuss heart disease and the return to sexual intimacy. Provides group verbal and written material to discuss and apply goal setting through the application of the S.M.A.R.T. Method.   Other Matters of the Heart: - Provides group verbal, written materials and models to describe Stable Angina and Peripheral Artery. Includes description  of the disease process and treatment options available to the cardiac patient.   Cardiac Rehab from 05/09/2018 in [facility] Cardiac and Pulmonary Rehab  Date  04/09/18  Educator  CE  Instruction Review Code  1- Verbalizes Understanding      Exercise & Equipment Safety: - Individual verbal instruction and demonstration of equipment use and safety with use of the equipment.   Cardiac Rehab from 01/02/2019 in [facility] Cardiac and Pulmonary Rehab  Date  10/11/18  Educator  [facility]  Instruction Review Code  1- Verbalizes Understanding      Infection Prevention: - Provides verbal and written material to individual with discussion of infection control including proper hand washing and proper equipment cleaning during exercise session.   Cardiac Rehab from 01/02/2019 in [facility] Cardiac and Pulmonary  Rehab  Date  10/11/18  Educator  [facility]  Instruction Review Code  1- Verbalizes Understanding      Falls Prevention: - Provides verbal and written material to individual with discussion of falls prevention and safety.   Cardiac Rehab from 01/02/2019 in [facility] Cardiac and Pulmonary Rehab  Date  10/11/18  Educator  [facility]  Instruction Review Code  1- Verbalizes Understanding      Diabetes: - Individual verbal and written instruction to review signs/symptoms of diabetes, desired ranges of glucose level fasting, after meals and with exercise. Acknowledge that pre and post exercise glucose checks will be done for 3 sessions at entry of program.   Cardiac Rehab from 01/02/2019 in [facility] Cardiac and Pulmonary Rehab  Date  10/11/18  Educator  [facility]  Instruction Review Code  1- Verbalizes Understanding      Know Your Numbers and Risk Factors: -Group verbal and written instruction about important numbers in your health.  Discussion of what are risk factors and how they play a role in the disease process.  Review of Cholesterol, Blood Pressure, Diabetes, and BMI and the role they play in your overall health.   Cardiac Rehab from  05/09/2018 in [facility] Cardiac and Pulmonary Rehab  Date  02/15/18  Educator  [facility]  Instruction Review Code  1- Verbalizes Understanding      Sleep Hygiene: -Provides group verbal and written instruction about how sleep can affect your health.  Define sleep hygiene, discuss sleep cycles and impact of sleep habits. Review good sleep hygiene tips.    Cardiac Rehab from 01/02/2019 in [facility] Cardiac and Pulmonary Rehab  Date  10/24/18  Educator  Lucianne Lei, MSW  Instruction Review Code  1- Verbalizes Understanding      Other: -Provides group and verbal instruction on various topics (see comments)   Knowledge Questionnaire Score: Knowledge Questionnaire Score - 10/11/18 1359      Knowledge Questionnaire Score   Pre Score  21/26   correct answers reviewed with [facility]. Focus on nutrition and exercise.       Core Components/Risk Factors/Patient Goals at Admission: Personal Goals and Risk Factors at Admission - 10/11/18 1347      Core Components/Risk Factors/Patient Goals on Admission    Weight Management  Yes;Weight Maintenance    Intervention  Weight Management: Develop a combined nutrition and exercise program designed to reach desired caloric intake, while maintaining appropriate intake of nutrient and fiber, sodium and fats, and appropriate energy expenditure required for the weight goal.;Weight Management: Provide education and appropriate resources to help participant work on and attain dietary goals.    Expected Outcomes  Short Term: Continue to assess and modify interventions until short term weight is achieved;Long Term: Adherence to nutrition and physical activity/exercise program aimed toward attainment of established weight goal;Weight Maintenance: Understanding of the daily nutrition guidelines, which includes 25-35% calories from fat, 7% or less cal from saturated fats, less than '200mg'$  cholesterol, less than 1.5gm of sodium, & 5 or more servings of fruits and vegetables  daily;Understanding recommendations for meals to include 15-35% energy as protein, 25-35% energy from fat, 35-60% energy from carbohydrates, less than '200mg'$  of dietary cholesterol, 20-35 gm of total fiber daily;Understanding of distribution of calorie intake throughout the day with the consumption of 4-5 meals/snacks    Diabetes  Yes    Intervention  Provide education about signs/symptoms and action to take for hypo/hyperglycemia.;Provide education about proper nutrition, including hydration, and aerobic/resistive exercise prescription along with prescribed medications to achieve blood  glucose in normal ranges: Fasting glucose 65-99 mg/dL    Expected Outcomes  Long Term: Attainment of HbA1C < 7%.;Short Term: Participant verbalizes understanding of the signs/symptoms and immediate care of hyper/hypoglycemia, proper foot care and importance of medication, aerobic/resistive exercise and nutrition plan for blood glucose control.    Hypertension  Yes    Intervention  Provide education on lifestyle modifcations including regular physical activity/exercise, weight management, moderate sodium restriction and increased consumption of fresh fruit, vegetables, and low fat dairy, alcohol moderation, and smoking cessation.;Monitor prescription use compliance.    Expected Outcomes  Short Term: Continued assessment and intervention until BP is < 140/11m HG in hypertensive participants. < 130/865mHG in hypertensive participants with diabetes, heart failure or chronic Malone disease.;Long Term: Maintenance of blood pressure at goal levels.    Lipids  Yes    Intervention  Provide education and support for participant on nutrition & aerobic/resistive exercise along with prescribed medications to achieve LDL '70mg'$ , HDL >'40mg'$ .    Expected Outcomes  Short Term: Participant states understanding of desired cholesterol values and is compliant with medications prescribed. Participant is following exercise prescription and  nutrition guidelines.;Long Term: Cholesterol controlled with medications as prescribed, with individualized exercise RX and with personalized nutrition plan. Value goals: LDL < '70mg'$ , HDL > 40 mg.       Core Components/Risk Factors/Patient Goals Review:  Goals and Risk Factor Review    Row Name 12/24/18 1641             Core Components/Risk Factors/Patient Goals Review   Personal Goals Review  Weight Management/Obesity;Improve shortness of breath with ADL's;Diabetes;Hypertension;Lipids       Review  Kayla Malone taking meds as directed.  Her FBG has been around 100.  She has had low BG once overnight.  During the day she can tell if it gets low.  She has spoken to her Dr about her lack of appetite and concern that she has lost weight. They advised that it is ok at this point.  She has spoken with RD about way to get nutrients.  Kayla Malone is concerned about her heart health.  Staff encouraged her to communicate with Dr any new or changing symptoms.        Expected Outcomes  Short - find foods she likes to help maintain strength/follow up with Dr about appetite  Long - maintain healthy weight and heart healthy diet          Core Components/Risk Factors/Patient Goals at Discharge (Final Review):  Goals and Risk Factor Review - 12/24/18 1641      Core Components/Risk Factors/Patient Goals Review   Personal Goals Review  Weight Management/Obesity;Improve shortness of breath with ADL's;Diabetes;Hypertension;Lipids    Review  Kayla Malone taking meds as directed.  Her FBG has been around 100.  She has had low BG once overnight.  During the day she can tell if it gets low.  She has spoken to her Dr about her lack of appetite and concern that she has lost weight. They advised that it is ok at this point.  She has spoken with RD about way to get nutrients.  Kayla Malone is concerned about her heart health.  Staff encouraged her to communicate with Dr any new or changing symptoms.     Expected Outcomes  Short -  find foods she likes to help maintain strength/follow up with Dr about appetite  Long - maintain healthy weight and heart healthy diet       ITP  Comments: ITP Comments    Row Name 10/11/18 1339 10/17/18 0611 11/01/18 1244 11/12/18 1557 11/14/18 0620   ITP Comments  Med Review completed. Initial ITP created. Diagnosis can be found in [facility] 10/14  30 day review. Continue with ITP unless direccted changes per Medical Director Chart Review.  Patient called to inform us that her stress test went well and she will be back on 11/05/18.  Patient states she is having a procedure for her heart and will return to [facility] with a doctors note.  30 day review. Continue with ITP unless direccted changes per Medical Director Chart Review.  OUt for medical reason   Row Name 11/19/18 1746 12/11/18 0650 01/09/19 1404       ITP Comments  Post exercise blood glucose was 79 mg/dL. Patient was given 4 glucose tabs and blood glucose was 110 mg/dL. Patient was offered PB crackers but refused and said she was going home to eat dinner.   30 Day Review. Continue with ITP unless directed changes per Medical Director review.  Only one visit this month  30 day review completed. ITP sent to Dr. Emily Filbert, Medical Director of Cardiac Rehab. Continue with ITP unless changes are made by physician.        Comments: 30 day review

## 2019-01-14 ENCOUNTER — Encounter: Payer: Medicare HMO | Attending: Cardiovascular Disease

## 2019-01-14 DIAGNOSIS — I214 Non-ST elevation (NSTEMI) myocardial infarction: Secondary | ICD-10-CM | POA: Diagnosis not present

## 2019-01-14 DIAGNOSIS — Z951 Presence of aortocoronary bypass graft: Secondary | ICD-10-CM

## 2019-01-14 DIAGNOSIS — Z955 Presence of coronary angioplasty implant and graft: Secondary | ICD-10-CM | POA: Diagnosis not present

## 2019-01-14 NOTE — Progress Notes (Signed)
Daily Session Note  Patient Details  Name: Kayla Malone MRN: [id number] Date of Birth: 01-22-1961 Referring Provider:     Cardiac Rehab from 10/11/2018 in [facility] Cardiac and Pulmonary Rehab  Referring Provider  [name] Date: 01/14/2019  Check In: Session Check In - 01/14/19 1657      Check-In   Supervising physician immediately available to respond to emergencies  See telemetry face sheet for immediately available ER MD    Location  [facility]-Cardiac & Pulmonary Rehab    Staff Present  Gerlene Burdock, RN, Moises Blood, BS, ACSM CEP, Exercise Physiologist;Amanda Oletta Darter, [place], ACSM CEP, Exercise Physiologist    Medication changes reported      No    Fall or balance concerns reported     No    Warm-up and Cool-down  Performed as group-led instruction    Resistance Training Performed  Yes    VAD Patient?  No    PAD/SET Patient?  No      Pain Assessment   Currently in Pain?  No/denies    Multiple Pain Sites  No          Social History   Tobacco Use  Smoking Status Former Smoker  . Last attempt to quit: 09/13/1998  . Years since quitting: 20.3  Smokeless Tobacco Never Used    Goals Met:  Proper associated with RPD/PD & O2 Sat Independence with exercise equipment Exercise tolerated well Strength training completed today  Goals Unmet:  Not Applicable  Comments: Pt able to follow exercise prescription today without complaint.  Will continue to monitor for progression.    Dr. Emily Filbert is Medical Director for [place] [name].

## 2019-01-17 DIAGNOSIS — I214 Non-ST elevation (NSTEMI) myocardial infarction: Secondary | ICD-10-CM

## 2019-01-17 DIAGNOSIS — Z955 Presence of coronary angioplasty implant and graft: Secondary | ICD-10-CM | POA: Diagnosis not present

## 2019-01-17 NOTE — Progress Notes (Signed)
Daily Session Note  Patient Details  Name: Kayla Malone MRN: [id number] Date of Birth: December 08, 1961 Referring Provider:     Cardiac Rehab from 10/11/2018 in [facility] Cardiac and Pulmonary Rehab  Referring Provider  [name] Date: 01/17/2019  Check In: Session Check In - 01/17/19 1611      Check-In   Supervising physician immediately available to respond to emergencies  See telemetry face sheet for immediately available ER MD    Location  [facility]-Cardiac & Pulmonary Rehab    Staff Present  Justin Mend RCP,RRT,BSRT;Meredith Sherryll Burger, RN BSN;Jeanna Durrell BS, Exercise Physiologist    Medication changes reported      No    Fall or balance concerns reported     No    Warm-up and Cool-down  Performed as group-led instruction    Resistance Training Performed  Yes    VAD Patient?  No    PAD/SET Patient?  No      Pain Assessment   Currently in Pain?  No/denies          Social History   Tobacco Use  Smoking Status Former Smoker  . Last attempt to quit: 09/13/1998  . Years since quitting: 20.3  Smokeless Tobacco Never Used    Goals Met:  Independence with exercise equipment Exercise tolerated well No report of cardiac concerns or symptoms Strength training completed today  Goals Unmet:  Not Applicable  Comments: Pt able to follow exercise prescription today without complaint.  Will continue to monitor for progression.    Dr. Emily Filbert is Medical Director for [place] [name].

## 2019-01-28 DIAGNOSIS — Z955 Presence of coronary angioplasty implant and graft: Secondary | ICD-10-CM

## 2019-01-28 DIAGNOSIS — Z951 Presence of aortocoronary bypass graft: Secondary | ICD-10-CM

## 2019-01-28 DIAGNOSIS — I214 Non-ST elevation (NSTEMI) myocardial infarction: Secondary | ICD-10-CM

## 2019-01-28 NOTE — Progress Notes (Signed)
Daily Session Note  Patient Details  Name: Kayla Malone MRN: [id number] Date of Birth: February 26, 1961 Referring Provider:     Cardiac Rehab from 10/11/2018 in [facility] Cardiac and Pulmonary Rehab  Referring Provider  Humphrey Rolls      Encounter Date: 01/28/2019  Check In: Session Check In - 01/28/19 1619      Check-In   Supervising physician immediately available to respond to emergencies  See telemetry face sheet for immediately available ER MD    Location  [facility]-Cardiac & Pulmonary Rehab    Staff Present  Earlean Shawl, BS, ACSM CEP, Exercise Physiologist;Carroll Enterkin, RN, Vickki Hearing, BA, ACSM CEP, Exercise Physiologist    Medication changes reported      No    Fall or balance concerns reported     No    Warm-up and Cool-down  Performed as group-led instruction    Resistance Training Performed  Yes    VAD Patient?  No    PAD/SET Patient?  No      Pain Assessment   Currently in Pain?  No/denies    Multiple Pain Sites  No          Social History   Tobacco Use  Smoking Status Former Smoker  . Last attempt to quit: 09/13/1998  . Years since quitting: 20.3  Smokeless Tobacco Never Used    Goals Met:  Independence with exercise equipment Exercise tolerated well No report of cardiac concerns or symptoms Strength training completed today  Goals Unmet:  Not Applicable  Comments: Pt able to follow exercise prescription today without complaint.  Will continue to monitor for progression.    Dr. Emily Filbert is Medical Director for [place] [name].

## 2019-02-05 ENCOUNTER — Telehealth: Payer: Self-pay | Admitting: *Deleted

## 2019-02-05 ENCOUNTER — Encounter: Payer: Self-pay | Admitting: *Deleted

## 2019-02-05 DIAGNOSIS — I214 Non-ST elevation (NSTEMI) myocardial infarction: Secondary | ICD-10-CM

## 2019-02-05 DIAGNOSIS — Z955 Presence of coronary angioplasty implant and graft: Secondary | ICD-10-CM

## 2019-02-05 NOTE — Telephone Encounter (Signed)
Office called to let us know that Kayla Malone is out sick this week and they requested for her to stay out of rehab as well.

## 2019-02-06 ENCOUNTER — Encounter: Payer: Self-pay | Admitting: *Deleted

## 2019-02-06 DIAGNOSIS — I214 Non-ST elevation (NSTEMI) myocardial infarction: Secondary | ICD-10-CM

## 2019-02-06 DIAGNOSIS — Z955 Presence of coronary angioplasty implant and graft: Secondary | ICD-10-CM

## 2019-02-06 NOTE — Progress Notes (Signed)
Cardiac Individual Treatment Plan  Patient Details  Name: Kayla Malone MRN: [id number] Date of Birth: 1961/01/30 Referring Provider:     Cardiac Rehab from 10/11/2018 in [facility] Cardiac and Pulmonary Rehab  Referring Provider  Humphrey Rolls      Initial Encounter Date:    Cardiac Rehab from 10/11/2018 in [facility] Cardiac and Pulmonary Rehab  Date  10/11/18      Visit Diagnosis: NSTEMI (non-ST elevated myocardial infarction) [facility])  Status post coronary artery stent placement  Patient's Home Medications on Admission:  Current Outpatient Medications:  .  acetaminophen (TYLENOL) 500 MG tablet, Take 1,000 mg by mouth every 6 (six) hours as needed for mild pain or headache., Disp: , Rfl:  .  albuterol (ACCUNEB) 0.63 MG/3ML nebulizer solution, Take 1 ampule by nebulization every 6 (six) hours as needed for wheezing., Disp: , Rfl:  .  albuterol (PROVENTIL HFA;VENTOLIN HFA) 108 (90 Base) MCG/ACT inhaler, Inhale 2 puffs into the lungs every 6 (six) hours as needed for wheezing or shortness of breath. , Disp: , Rfl:  .  aspirin EC 81 MG tablet, Take 81 mg by mouth daily., Disp: , Rfl:  .  atorvastatin (LIPITOR) 40 MG tablet, Take 80 mg by mouth daily. , Disp: , Rfl:  .  fluticasone (FLONASE) 50 MCG/ACT nasal spray, Place 2 sprays into both nostrils daily., Disp: , Rfl:  .  Fluticasone-Salmeterol (ADVAIR DISKUS) 500-50 MCG/DOSE AEPB, Inhale 1 puff into the lungs daily as needed (SOB). , Disp: , Rfl:  .  losartan (COZAAR) 50 MG tablet, Take 50 mg by mouth daily., Disp: , Rfl: 2 .  metFORMIN (GLUCOPHAGE) 500 MG tablet, Take 1,000 mg by mouth every morning. , Disp: , Rfl: 5 .  metFORMIN (GLUCOPHAGE) 500 MG tablet, Take 500 mg by mouth every evening., Disp: , Rfl:  .  metoprolol succinate (TOPROL-XL) 50 MG 24 hr tablet, Take 50 mg by mouth daily. Take with or immediately following a meal., Disp: , Rfl:  .  nitroGLYCERIN (NITROSTAT) 0.4 MG SL tablet, Place 1 tablet (0.4 mg total) under the tongue every 5  (five) minutes x 3 doses as needed for chest pain., Disp: 25 tablet, Rfl: 0 .  pantoprazole (PROTONIX) 40 MG tablet, Take 40 mg by mouth. , Disp: , Rfl:  .  sitaGLIPtin (JANUVIA) 50 MG tablet, Take 50 mg by mouth daily., Disp: , Rfl:  .  ticagrelor (BRILINTA) 90 MG TABS tablet, Take 1 tablet (90 mg total) by mouth 2 (two) times daily., Disp: 30 tablet, Rfl: 0 .  traZODone (DESYREL) 50 MG tablet, Take 50 mg by mouth at bedtime., Disp: , Rfl: 5 .  VOLTAREN 1 % GEL, Apply 2 g topically as needed (pain in shoulder). , Disp: , Rfl: 2  Past Medical History: Past Medical History:  Diagnosis Date  . Asthma   . Coronary artery disease   . Depression   . Diabetes mellitus without complication ([place])   . GERD (gastroesophageal reflux disease)   . History of hiatal hernia   . Hypertension   . Pain    BACK  . Stroke ([place])    x3  . Thyroid cyst     Tobacco Use: Social History   Tobacco Use  Smoking Status Former Smoker  . Last attempt to quit: 09/13/1998  . Years since quitting: 20.4  Smokeless Tobacco Never Used    Labs: Recent Review Scientist, physiological    Labs for ITP Cardiac and Pulmonary Rehab Latest Ref Rng & Units 10/23/2017 10/23/2017  10/23/2017 10/23/2017 10/24/2017   Hemoglobin A1c 4.8 - 5.6 % 7.3(H) - - - -   PHART 7.350 - 7.450 - 7.359 7.402 - -   PCO2ART 32.0 - 48.0 mmHg - 44.0 41.4 - -   HCO3 20.0 - 28.0 mmol/L - 25.1 26.0 - -   TCO2 22 - 32 mmol/L - _0 ACIDBASEDEF 0.0 - 2.0 mmol/L - 1.0 - - -   O2SAT % - 99.0 99.0 - -       Exercise Target Goals: Exercise Program Goal: Individual exercise prescription set using results from initial 6 min walk test and THRR while considering  patient's activity barriers and safety.   Exercise Prescription Goal: Initial exercise prescription builds to 30-45 minutes a day of aerobic activity, 2-3 days per week.  Home exercise guidelines will be given to patient during program as part of exercise prescription that the participant  will acknowledge.  Activity Barriers & Risk Stratification:   6 Minute Walk: 6 Minute Walk    Row Name 10/11/18 1351         6 Minute Walk   Phase  Initial     Distance  700 feet     Walk Time  5.75 minutes     # of Rest Breaks  1     MPH  1.32     METS  2.72     RPE  12     Perceived Dyspnea   1     VO2 Peak  9.5     Symptoms  Yes (comment)     Comments  foot drag when she fatigues     Resting HR  72 bpm     Resting BP  148/82     Resting Oxygen Saturation   97 %     Exercise Oxygen Saturation  during 6 min walk  98 %     Max Ex. HR  95 bpm     Max Ex. BP  150/78     2 Minute Post BP  130/78        Oxygen Initial Assessment:   Oxygen Re-Evaluation:   Oxygen Discharge (Final Oxygen Re-Evaluation):   Initial Exercise Prescription: Initial Exercise Prescription - 10/11/18 1300      Date of Initial Exercise RX and Referring Provider   Date  10/11/18    Referring Provider  Humphrey Rolls      NuStep   Level  1    SPM  80    Minutes  15    METs  2.7      Biostep-RELP   Level  2    SPM  50    Minutes  15    METs  2      Track   Laps  25    Minutes  15    METs  2.15      Prescription Details   Frequency (times per week)  3    Duration  Progress to 45 minutes of aerobic exercise without signs/symptoms of physical distress      Intensity   THRR 40-80% of Max Heartrate  108-144    Ratings of Perceived Exertion  11-13    Perceived Dyspnea  0-4      Resistance Training   Training Prescription  Yes    Weight  3 lb    Reps  10-15       Perform Capillary Blood Glucose checks as needed.  Exercise Prescription Changes: Exercise Prescription  Changes    Row Name 10/11/18 1400 10/24/18 1100 11/12/18 1400 11/21/18 1100 12/19/18 1300     Response to Exercise   Blood Pressure (Admit)  148/82  140/80  130/62  122/60  110/58   Blood Pressure (Exercise)  150/78  140/80  120/68  148/84  126/62   Blood Pressure (Exit)  130/78  124/74  120/62  128/78  124/78    Heart Rate (Admit)  72 bpm  75 bpm  64 bpm  80 bpm  70 bpm   Heart Rate (Exercise)  95 bpm  96 bpm  92 bpm  103 bpm  88 bpm   Heart Rate (Exit)  63 bpm  74 bpm  63 bpm  63 bpm  55 bpm   Rating of Perceived Exertion (Exercise)  '12  14  15  13  15   '$ Perceived Dyspnea (Exercise)  1  -  -  -  -   Symptoms  foot drag  -  -  -  -   Comments  -  first session  -  -  -   Duration  -  Progress to 45 minutes of aerobic exercise without signs/symptoms of physical distress  Progress to 45 minutes of aerobic exercise without signs/symptoms of physical distress  Progress to 45 minutes of aerobic exercise without signs/symptoms of physical distress  Progress to 45 minutes of aerobic exercise without signs/symptoms of physical distress   Intensity  -  THRR unchanged  THRR unchanged  THRR unchanged  Other (comment) use RPE     Progression   Progression  -  Continue to progress workloads to maintain intensity without signs/symptoms of physical distress.  Continue to progress workloads to maintain intensity without signs/symptoms of physical distress.  Continue to progress workloads to maintain intensity without signs/symptoms of physical distress.  Continue to progress workloads to maintain intensity without signs/symptoms of physical distress.   Average METs  -  2  2  2.6  3     Resistance Training   Training Prescription  -  Yes  Yes  Yes  Yes   Weight  -  3 lb  3 lb  3 lb  3 lb   Reps  -  10-15  10-15  10-15  10-15     Interval Training   Interval Training  -  No  No  No  No     NuStep   Level  -  -  -  3  3   SPM  -  -  -  80  80   Minutes  -  -  -  15  15   METs  -  -  -  3.2  3     Biostep-RELP   Level  -  '2  2  2  '$ -   SPM  -  50  50  50  -   Minutes  -  '15  30  15  '$ -   METs  -  '2  2  2  '$ -   Row Name 01/03/19 0800 01/15/19 1200 01/30/19 1100         Response to Exercise   Blood Pressure (Admit)  126/60  116/60  124/62     Blood Pressure (Exercise)  136/60  134/64  142/70     Blood  Pressure (Exit)  120/66  124/70  -     Heart Rate (Admit)  55 bpm  57 bpm  60 bpm  Heart Rate (Exercise)  119 bpm  100 bpm  78 bpm     Heart Rate (Exit)  67 bpm  79 bpm  53 bpm     Rating of Perceived Exertion (Exercise)  '12  13  13     '$ Duration  Progress to 45 minutes of aerobic exercise without signs/symptoms of physical distress  Progress to 45 minutes of aerobic exercise without signs/symptoms of physical distress  Progress to 45 minutes of aerobic exercise without signs/symptoms of physical distress     Intensity  Other (comment) use RPE  Other (comment) limited due to stroke   Other (comment)       Progression   Progression  Continue to progress workloads to maintain intensity without signs/symptoms of physical distress.  Continue to progress workloads to maintain intensity without signs/symptoms of physical distress.  Continue to progress workloads to maintain intensity without signs/symptoms of physical distress.     Average METs  3  3  3.15       Resistance Training   Training Prescription  Yes  Yes  Yes     Weight  3 lb  3 lb  3 lb     Reps  10-15  10-15  10-15       Interval Training   Interval Training  No  No  No       NuStep   Level  3  -  3     SPM  80  -  80     Minutes  15  -  30     METs  3.2  -  3.7       Biostep-RELP   Level  2  2  -     SPM  50  50  -     Minutes  15  15  -     METs  3  3  -        Exercise Comments: Exercise Comments    Row Name 12/24/18 1705           Exercise Comments  Reviewed home exercise with pt today.  Pt plans to join [employer] for exercise.  Reviewed THR, pulse, RPE, sign and symptoms, NTG use, and when to call 911 or MD.  Also discussed weather considerations and indoor options.  Pt voiced understanding.          Exercise Goals and Review: Exercise Goals    Row Name 10/11/18 1350             Exercise Goals   Increase Physical Activity  Yes       Intervention  Provide advice, education, support and counseling  about physical activity/exercise needs.;Develop an individualized exercise prescription for aerobic and resistive training based on initial evaluation findings, risk stratification, comorbidities and participant's personal goals.       Expected Outcomes  Short Term: Attend rehab on a regular basis to increase amount of physical activity.;Long Term: Add in home exercise to make exercise part of routine and to increase amount of physical activity.;Long Term: Exercising regularly at least 3-5 days a week.       Increase Strength and Stamina  Yes       Intervention  Provide advice, education, support and counseling about physical activity/exercise needs.;Develop an individualized exercise prescription for aerobic and resistive training based on initial evaluation findings, risk stratification, comorbidities and participant's personal goals.       Expected Outcomes  Short Term: Increase workloads  from initial exercise prescription for resistance, speed, and METs.;Short Term: Perform resistance training exercises routinely during rehab and add in resistance training at home;Long Term: Improve cardiorespiratory fitness, muscular endurance and strength as measured by increased METs and functional capacity (6MWT)       Able to understand and use rate of perceived exertion (RPE) scale  Yes       Intervention  Provide education and explanation on how to use RPE scale       Expected Outcomes  Short Term: Able to use RPE daily in rehab to express subjective intensity level;Long Term:  Able to use RPE to guide intensity level when exercising independently       Knowledge and understanding of Target Heart Rate Range (THRR)  Yes       Intervention  Provide education and explanation of THRR including how the numbers were predicted and where they are located for reference       Expected Outcomes  Short Term: Able to state/look up THRR;Short Term: Able to use daily as guideline for intensity in rehab;Long Term: Able to use  THRR to govern intensity when exercising independently       Able to check pulse independently  Yes       Intervention  Provide education and demonstration on how to check pulse in carotid and radial arteries.;Review the importance of being able to check your own pulse for safety during independent exercise       Expected Outcomes  Short Term: Able to explain why pulse checking is important during independent exercise;Long Term: Able to check pulse independently and accurately       Understanding of Exercise Prescription  Yes       Intervention  Provide education, explanation, and written materials on patient's individual exercise prescription       Expected Outcomes  Short Term: Able to explain program exercise prescription;Long Term: Able to explain home exercise prescription to exercise independently          Exercise Goals Re-Evaluation : Exercise Goals Re-Evaluation    Row Name 11/12/18 1504 11/21/18 1130 12/19/18 1349 12/24/18 1705 01/03/19 0815     Exercise Goal Re-Evaluation   Exercise Goals Review  Increase Physical Activity;Increase Strength and Stamina;Able to understand and use rate of perceived exertion (RPE) scale;Knowledge and understanding of Target Heart Rate Range (THRR)  Increase Physical Activity;Increase Strength and Stamina;Able to understand and use rate of perceived exertion (RPE) scale;Knowledge and understanding of Target Heart Rate Range (THRR)  Increase Physical Activity;Increase Strength and Stamina;Able to understand and use rate of perceived exertion (RPE) scale  Increase Physical Activity;Able to understand and use rate of perceived exertion (RPE) scale;Knowledge and understanding of Target Heart Rate Range (THRR);Understanding of Exercise Prescription;Increase Strength and Stamina  Increase Physical Activity;Increase Strength and Stamina;Able to understand and use rate of perceived exertion (RPE) scale;Knowledge and understanding of Target Heart Rate Range (THRR)    Comments  Vitalia has only attended 4 sessions this month.  She would progress more with consistent attendance.    Labrenda did well on the NS.  She still needs to attend consistently to see progress.  Pinkey only attended HT once in December.  More consistent attendance is necessary for her to progress.  Reviewed home exercise with pt today.  Pt plans to join [employer] for exercise.  Reviewed THR, pulse, RPE, sign and symptoms, NTG use, and when to call 911 or MD.  Also discussed weather considerations and indoor options.  Pt voiced  understanding.  Twanna tolerates exercise well when she attends.  More consistent attendance would yield better progress.   Expected Outcomes  Short - attend consistently 3 times per week Long - increase overall MET level  Short - attend three days per week Long - increase MET level  Short - attend HT 3 times per week Long - improve MET level and strength  Short - add one day per week exercise at home Long - continue exercise in [employer] - attend consistently Long - increase MET level   Row Name 01/15/19 1242 01/30/19 1141           Exercise Goal Re-Evaluation   Exercise Goals Review  Increase Physical Activity;Increase Strength and Stamina;Able to understand and use rate of perceived exertion (RPE) scale;Knowledge and understanding of Target Heart Rate Range (THRR);Understanding of Exercise Prescription  Increase Physical Activity;Increase Strength and Stamina;Able to understand and use rate of perceived exertion (RPE) scale;Knowledge and understanding of Target Heart Rate Range (THRR);Able to check pulse independently      Comments  Kelissa sometimes arrives late and misses the strength portion of class which would help her leg strength.  Staff will encourage arriving on time  [facility] does well with NS and did 30 min instead of changing machines today.  Her MET level was 3.7.      Expected Outcomes  Short - be to class in time for strength training Long - Improve strength and  stamina   Short - attend consistently and work on walking Long - increase MET level and walking time         Discharge Exercise Prescription (Final Exercise Prescription Changes): Exercise Prescription Changes - 01/30/19 1100      Response to Exercise   Blood Pressure (Admit)  124/62    Blood Pressure (Exercise)  142/70    Heart Rate (Admit)  60 bpm    Heart Rate (Exercise)  78 bpm    Heart Rate (Exit)  53 bpm    Rating of Perceived Exertion (Exercise)  13    Duration  Progress to 45 minutes of aerobic exercise without signs/symptoms of physical distress    Intensity  Other (comment)      Progression   Progression  Continue to progress workloads to maintain intensity without signs/symptoms of physical distress.    Average METs  3.15      Resistance Training   Training Prescription  Yes    Weight  3 lb    Reps  10-15      Interval Training   Interval Training  No      NuStep   Level  3    SPM  80    Minutes  30    METs  3.7       Nutrition:  Target Goals: Understanding of nutrition guidelines, daily intake of sodium '1500mg'$ , cholesterol '200mg'$ , calories 30% from fat and 7% or less from saturated fats, daily to have 5 or more servings of fruits and vegetables.  Biometrics: Pre Biometrics - 10/11/18 1350      Pre Biometrics   Height  5' 4.25" (1.632 m)    Weight  146 lb 14.4 oz (66.6 kg)    Waist Circumference  32 inches    Hip Circumference  36.5 inches    Waist to Hip Ratio  0.88 %    BMI (Calculated)  25.02        Nutrition Therapy Plan and Nutrition Goals: Nutrition Therapy & Goals -  11/19/18 1641      Nutrition Therapy   Diet  DM    Drug/Food Interactions  Statins/Certain Fruits    Protein (specify units)  9-10oz    Fiber  20 grams    Whole Grain Foods  3 servings   chooses some whole grains IE oatmeal   Saturated Fats  12 max. grams    Fruits and Vegetables  5 servings/day   8 ideal; likes fruits and vegetables but has a poor appetite most days    Sodium  1500 grams      Personal Nutrition Goals   Nutrition Goal  Try adding marinades, different seasonings/ herbs/ spices to foods to improve taste profile. Foods that have aromas like soup may help stimulate appetite as well    Personal Goal #2  On days you do not feel like eating it may be helpful to make a smoothie as an alternative/ liquid nutritional source, espeically since you do not like nutritional supplement drinks    Comments  She c/o severe decline in appetite after losing her sense of smell/taste s/p mini stroke. She has been losing wt, approx 10# since her last surgery date. Currently she eats 1-2x/day, with some days being better than others in terms of appetite and "having a taste for things." She is a diabetic with BG readings typically 90-100 recently. Foods she likes and can tolerate: fruits, cheese, most vegetables, baked potatoes, salad, dry cereal, hard boiled eggs, oatmeal, soup. Does not usually eat breakfast. Drinks water and the occasional tea. She has tried nutritional drinks like Ensure and dislikes them.      Intervention Plan   Intervention  Prescribe, educate and counsel regarding individualized specific dietary modifications aiming towards targeted core components such as weight, hypertension, lipid management, diabetes, heart failure and other comorbidities.;Nutrition handout(s) given to patient.   Nutrition tips for taste changes handout; Smoothie recipes   Expected Outcomes  Short Term Goal: Understand basic principles of dietary content, such as calories, fat, sodium, cholesterol and nutrients.;Short Term Goal: A plan has been developed with personal nutrition goals set during dietitian appointment.;Long Term Goal: Adherence to prescribed nutrition plan.       Nutrition Assessments: Nutrition Assessments - 10/11/18 1409      MEDFICTS Scores   Pre Score  0   no appetite. barely eats anything through out the day.       Nutrition Goals  Re-Evaluation: Nutrition Goals Re-Evaluation    [place] Name 11/19/18 1656 12/18/18 0929 01/28/19 1629         Goals   Current Weight  -  -  143 lb (64.9 kg)     Nutrition Goal  Try adding marinades, different seasonings/ herbs/ spices to foods to improve taste profile. Foods that have aromas like soup may help stimulate appetite as well  Try adding marinades, seasonings, herbs or spices to foods to improve taste profile. Foods that have aromas like soup may be helpful to stimulate appetite as well; On days when you do not feel like eating try having a smoothie instead  -     Comment  She c/o lack of smell and taste leading to poor appetite and desire to eat  She continues to c/o lack of smell and taste but is trying to include more foods. She has purchased 100% fruit snacks for times that she feels she is having low blood sugar, as she enjoys them. She was educated on more nutritious options, and instructed to eat 1/2 of the  pack of fruit snacks (~15-20g CHO) should she want to choose this as an option to correct a low blood sugar rather than eating the whole pack and overshooting (~50g CHO)  Maecyn eats small meals twice a day.  She does eat vegetables.  She feels she knows what to eat.     Expected Outcome  She will try different flavorings and cooking methods to help encourage her to eat and to prevent further wt loss  She will choose whole food options for meals and snacks most often to both meet her daily nutritional needs and best manage BG levels. Continue to experiment with food preparation methods that encourage appetite despite altered taste and smell  Short -  focus on heart healthy foods Long - maintain BG and A1C        Personal Goal #2 Re-Evaluation   Personal Goal #2  On days you do not feel like eating it may be helpful to make a smoothie as an alternative / liquid nutritional source, especially since you do not like nutritional supplement drinks  -  -        Nutrition Goals Discharge  (Final Nutrition Goals Re-Evaluation): Nutrition Goals Re-Evaluation - 01/28/19 1629      Goals   Current Weight  143 lb (64.9 kg)    Comment  Adayah eats small meals twice a day.  She does eat vegetables.  She feels she knows what to eat.    Expected Outcome  Short -  focus on heart healthy foods Long - maintain BG and A1C        Psychosocial: Target Goals: Acknowledge presence or absence of significant depression and/or stress, maximize coping skills, provide positive support system. Participant is able to verbalize types and ability to use techniques and skills needed for reducing stress and depression.   Initial Review & Psychosocial Screening: Initial Psych Review & Screening - 10/11/18 1401      Initial Review   Current issues with  History of Depression;Current Stress Concerns    Source of Stress Concerns  Poor Coping Skills;Unable to participate in former interests or hobbies;Chronic Illness;Financial    Comments  Lyle is hesistant to do much outside of her home. It is hard for her to get going during the day. She still has very little appetite and very low energy.       Family Dynamics   Good Support System?  Yes   she relies on her sister, but she is not in good health either. She did not mention any of her children this time.      Screening Interventions   Interventions  Encouraged to exercise;Program counselor consult;To provide support and resources with identified psychosocial needs    Expected Outcomes  Short Term goal: Utilizing psychosocial counselor, staff and physician to assist with identification of specific Stressors or current issues interfering with healing process. Setting desired goal for each stressor or current issue identified.;Long Term Goal: Stressors or current issues are controlled or eliminated.;Short Term goal: Identification and review with participant of any Quality of Life or Depression concerns found by scoring the questionnaire.;Long Term goal: The  participant improves quality of Life and PHQ9 Scores as seen by post scores and/or verbalization of changes       Quality of Life Scores:  Quality of Life - 10/11/18 1403      Quality of Life   Select  Quality of Life      Quality of Life Scores   Health/Function Pre  16.57 %    Socioeconomic Pre  23.5 %    Psych/Spiritual Pre  22.29 %    Family Pre  24.38 %    GLOBAL Pre  20.09 %      Scores of 19 and below usually indicate a poorer quality of life in these areas.  A difference of  2-3 points is a clinically meaningful difference.  A difference of 2-3 points in the total score of the Quality of Life Index has been associated with significant improvement in overall quality of life, self-image, physical symptoms, and general health in studies assessing change in quality of life.  PHQ-9: Recent Review Flowsheet Data    Depression screen [facility] 2/9 10/11/2018 05/09/2018 12/28/2017   Decreased Interest '3 2 2   '$ Down, Depressed, Hopeless '3 2 2   '$ PHQ - 2 Score '6 4 4   '$ Altered sleeping 0 2 2   Tired, decreased energy '3 2 2   '$ Change in appetite '3 2 3   '$ Feeling bad or failure about yourself  2 3 0   Trouble concentrating '3 2 1   '$ Moving slowly or fidgety/restless 0 1 0   Suicidal thoughts 0 0 0   PHQ-9 Score '17 16 12   '$ Difficult doing work/chores Very difficult Somewhat difficult Not difficult at all     Interpretation of Total Score  Total Score Depression Severity:  1-4 = Minimal depression, 5-9 = Mild depression, 10-14 = Moderate depression, 15-19 = Moderately severe depression, 20-27 = Severe depression   Psychosocial Evaluation and Intervention: Psychosocial Evaluation - 10/17/18 1619      Psychosocial Evaluation & Interventions   Interventions  Stress management education;Relaxation education;Encouraged to exercise with the program and follow exercise prescription    Comments  Counselor met with Ms. Keturah Barre Stanton Kidney) today for her return to Cardiac Rehab after 6 months - due to a mild  heart attack and stent insertion.  Danylle will be 58 years old this weekend and has a strong support system with (4) adult children and a significant other.  She reports sleeping "better" with the use of a prescription med but doesn't have much of an appetite.  She a history of depression and reports taking meds to help with this.  Her PHQ-9 scores are 17 indicating moderately severe symptoms of depression are present.  She has multiple stressors with her health; being disabled and finances.  Diann has goals to "get heart healthy" and get back to church - where she had just returned the day she had her mild heart attack.  Counselor will follow.      Expected Outcomes  Short:  Mckenlee will continue taking her mood medication and will contact her Dr. if her symptoms do not improve in the near future - with the use of consistent exercise.   Long:  Calisa will continue to practice positive self-care for her health and mental health for an improved quality of life.    Continue Psychosocial Services   Follow up required by counselor       Psychosocial Re-Evaluation: Psychosocial Re-Evaluation    Lynn Name 11/07/18 1655 12/24/18 1650 01/28/19 1624         Psychosocial Re-Evaluation   Current issues with  Current Psychotropic Meds  Current Stress Concerns;Current Sleep Concerns  Current Stress Concerns;Current Sleep Concerns     Comments  Counselor follow up with Stanton Kidney today reporting she has begun two new medications - one for her heart and one for her mental health/stress and  anxiety.  She reports both seem to be working well and she is sleeping better also.  She has less stress and anxiety and her mood is more positive as a result.  Lanae states the medication for her heart is helping her feel less fearful about having another heart attack and this confidence in turn helps her relax more.  Counselor commended [place] on her progress and positive self-care.  Taraneh is still concerned about her heart health.  Staff  encouraged her to follow up with Dr about any concerns.  She reports sleeping "ok" about 5 hours per night.  Malerie's main stress is helping her sister.  She has not been sleeping well.  Her sister is in the ER now.  She feels coming to exercise helps her with stress.     Expected Outcomes  Short:  Disa will take her medications as directed by her Drs.  She will continue to exercise for her health and mental health.  Long:  Ariam will develop routine healthy habits including exercise.    Short - continue to attend Cardiac rehab Long - manage stress on her own  Short - follow up with Dr and continue exercise Long - manage stress on her own     Interventions  -  Encouraged to attend Cardiac Rehabilitation for the exercise  Encouraged to attend Cardiac Rehabilitation for the exercise     Continue Psychosocial Services   Follow up required by staff  Follow up required by staff  Follow up required by staff        Psychosocial Discharge (Final Psychosocial Re-Evaluation): Psychosocial Re-Evaluation - 01/28/19 1624      Psychosocial Re-Evaluation   Current issues with  Current Stress Concerns;Current Sleep Concerns    Comments  Greydis's main stress is helping her sister.  She has not been sleeping well.  Her sister is in the ER now.  She feels coming to exercise helps her with stress.    Expected Outcomes  Short - follow up with Dr and continue exercise Long - manage stress on her own    Interventions  Encouraged to attend Cardiac Rehabilitation for the exercise    Continue Psychosocial Services   Follow up required by staff       Vocational Rehabilitation: Provide vocational rehab assistance to qualifying candidates.   Vocational Rehab Evaluation & Intervention: Vocational Rehab - 10/11/18 1400      Initial Vocational Rehab Evaluation & Intervention   Assessment shows need for Vocational Rehabilitation  No       Education: Education Goals: Education classes will be provided on a variety of topics  geared toward better understanding of heart health and risk factor modification. Participant will state understanding/return demonstration of topics presented as noted by education test scores.  Learning Barriers/Preferences: Learning Barriers/Preferences - 10/11/18 1348      Learning Barriers/Preferences   Learning Barriers  Exercise Concerns    Learning Preferences  Audio;Video       Education Topics:  AED/CPR: - Group verbal and written instruction with the use of models to demonstrate the basic use of the AED with the basic ABC's of resuscitation.   Cardiac Rehab from 05/09/2018 in [facility] Cardiac and Pulmonary Rehab  Date  05/02/18  Educator  CE  Instruction Review Code  1- Verbalizes Understanding      General Nutrition Guidelines/Fats and Fiber: -Group instruction provided by verbal, written material, models and posters to present the general guidelines for heart healthy nutrition. Gives an explanation and  review of dietary fats and fiber.   Controlling Sodium/Reading Food Labels: -Group verbal and written material supporting the discussion of sodium use in heart healthy nutrition. Review and explanation with models, verbal and written materials for utilization of the food label.   Cardiac Rehab from 01/14/2019 in [facility] Cardiac and Pulmonary Rehab  Date  10/17/18  Educator  LB  Instruction Review Code  1- Verbalizes Understanding      Exercise Physiology & General Exercise Guidelines: - Group verbal and written instruction with models to review the exercise physiology of the cardiovascular system and associated critical values. Provides general exercise guidelines with specific guidelines to those with heart or lung disease.    Cardiac Rehab from 05/09/2018 in [facility] Cardiac and Pulmonary Rehab  Date  03/21/18  Educator  [facility]  Instruction Review Code  1- Verbalizes Understanding      Aerobic Exercise & Resistance Training: - Gives group verbal and written instruction on the  various components of exercise. Focuses on aerobic and resistive training programs and the benefits of this training and how to safely progress through these programs..   Cardiac Rehab from 01/14/2019 in [facility] Cardiac and Pulmonary Rehab  Date  01/02/19  Educator  AS  Instruction Review Code  1- Verbalizes Understanding      Flexibility, Balance, Mind/Body Relaxation: Provides group verbal/written instruction on the benefits of flexibility and balance training, including mind/body exercise modes such as yoga, pilates and tai chi.  Demonstration and skill practice provided.   Stress and Anxiety: - Provides group verbal and written instruction about the health risks of elevated stress and causes of high stress.  Discuss the correlation between heart/lung disease and anxiety and treatment options. Review healthy ways to manage with stress and anxiety.   Cardiac Rehab from 05/09/2018 in [facility] Cardiac and Pulmonary Rehab  Date  02/13/18  Educator  [facility]  Instruction Review Code  1- Verbalizes Understanding      Depression: - Provides group verbal and written instruction on the correlation between heart/lung disease and depressed mood, treatment options, and the stigmas associated with seeking treatment.   Cardiac Rehab from 05/09/2018 in [facility] Cardiac and Pulmonary Rehab  Date  03/28/18  Educator  [facility]  Instruction Review Code  1- Verbalizes Understanding      Anatomy & Physiology of the Heart: - Group verbal and written instruction and models provide basic cardiac anatomy and physiology, with the coronary electrical and arterial systems. Review of Valvular disease and Heart Failure   Cardiac Rehab from 01/14/2019 in [facility] Cardiac and Pulmonary Rehab  Date  01/14/19  Educator  CE  Instruction Review Code  5- Refused Teaching      Cardiac Procedures: - Group verbal and written instruction to review commonly prescribed medications for heart disease. Reviews the medication, class of the drug, and  side effects. Includes the steps to properly store meds and maintain the prescription regimen. (beta blockers and nitrates)   Cardiac Medications I: - Group verbal and written instruction to review commonly prescribed medications for heart disease. Reviews the medication, class of the drug, and side effects. Includes the steps to properly store meds and maintain the prescription regimen.   Cardiac Rehab from 05/09/2018 in [facility] Cardiac and Pulmonary Rehab  Date  04/16/18  Educator  CE  Instruction Review Code  1- Verbalizes Understanding      Cardiac Medications II: -Group verbal and written instruction to review commonly prescribed medications for heart disease. Reviews the medication, class of the drug,  and side effects. (all other drug classes)   Cardiac Rehab from 05/09/2018 in [facility] Cardiac and Pulmonary Rehab  Date  02/15/18  Educator  [facility]  Instruction Review Code  1- Verbalizes Understanding       Go Sex-Intimacy & Heart Disease, Get SMART - Goal Setting: - Group verbal and written instruction through game format to discuss heart disease and the return to sexual intimacy. Provides group verbal and written material to discuss and apply goal setting through the application of the S.M.A.R.T. Method.   Other Matters of the Heart: - Provides group verbal, written materials and models to describe Stable Angina and Peripheral Artery. Includes description of the disease process and treatment options available to the cardiac patient.   Cardiac Rehab from 05/09/2018 in [facility] Cardiac and Pulmonary Rehab  Date  04/09/18  Educator  CE  Instruction Review Code  1- Verbalizes Understanding      Exercise & Equipment Safety: - Individual verbal instruction and demonstration of equipment use and safety with use of the equipment.   Cardiac Rehab from 01/14/2019 in [facility] Cardiac and Pulmonary Rehab  Date  10/11/18  Educator  [facility]  Instruction Review Code  1- Verbalizes Understanding      Infection  Prevention: - Provides verbal and written material to individual with discussion of infection control including proper hand washing and proper equipment cleaning during exercise session.   Cardiac Rehab from 01/14/2019 in [facility] Cardiac and Pulmonary Rehab  Date  10/11/18  Educator  [facility]  Instruction Review Code  1- Verbalizes Understanding      Falls Prevention: - Provides verbal and written material to individual with discussion of falls prevention and safety.   Cardiac Rehab from 01/14/2019 in [facility] Cardiac and Pulmonary Rehab  Date  10/11/18  Educator  [facility]  Instruction Review Code  1- Verbalizes Understanding      Diabetes: - Individual verbal and written instruction to review signs/symptoms of diabetes, desired ranges of glucose level fasting, after meals and with exercise. Acknowledge that pre and post exercise glucose checks will be done for 3 sessions at entry of program.   Cardiac Rehab from 01/14/2019 in [facility] Cardiac and Pulmonary Rehab  Date  10/11/18  Educator  [facility]  Instruction Review Code  1- Verbalizes Understanding      Know Your Numbers and Risk Factors: -Group verbal and written instruction about important numbers in your health.  Discussion of what are risk factors and how they play a role in the disease process.  Review of Cholesterol, Blood Pressure, Diabetes, and BMI and the role they play in your overall health.   Cardiac Rehab from 05/09/2018 in [facility] Cardiac and Pulmonary Rehab  Date  02/15/18  Educator  [facility]  Instruction Review Code  1- Verbalizes Understanding      Sleep Hygiene: -Provides group verbal and written instruction about how sleep can affect your health.  Define sleep hygiene, discuss sleep cycles and impact of sleep habits. Review good sleep hygiene tips.    Cardiac Rehab from 01/14/2019 in [facility] Cardiac and Pulmonary Rehab  Date  10/24/18  Educator  Lucianne Lei, MSW  Instruction Review Code  1- Verbalizes Understanding      Other: -Provides group and  verbal instruction on various topics (see comments)   Knowledge Questionnaire Score: Knowledge Questionnaire Score - 10/11/18 1359      Knowledge Questionnaire Score   Pre Score  21/26   correct answers reviewed with [facility]. Focus on nutrition and exercise.  Core Components/Risk Factors/Patient Goals at Admission: Personal Goals and Risk Factors at Admission - 10/11/18 1347      Core Components/Risk Factors/Patient Goals on Admission    Weight Management  Yes;Weight Maintenance    Intervention  Weight Management: Develop a combined nutrition and exercise program designed to reach desired caloric intake, while maintaining appropriate intake of nutrient and fiber, sodium and fats, and appropriate energy expenditure required for the weight goal.;Weight Management: Provide education and appropriate resources to help participant work on and attain dietary goals.    Expected Outcomes  Short Term: Continue to assess and modify interventions until short term weight is achieved;Long Term: Adherence to nutrition and physical activity/exercise program aimed toward attainment of established weight goal;Weight Maintenance: Understanding of the daily nutrition guidelines, which includes 25-35% calories from fat, 7% or less cal from saturated fats, less than '200mg'$  cholesterol, less than 1.5gm of sodium, & 5 or more servings of fruits and vegetables daily;Understanding recommendations for meals to include 15-35% energy as protein, 25-35% energy from fat, 35-60% energy from carbohydrates, less than '200mg'$  of dietary cholesterol, 20-35 gm of total fiber daily;Understanding of distribution of calorie intake throughout the day with the consumption of 4-5 meals/snacks    Diabetes  Yes    Intervention  Provide education about signs/symptoms and action to take for hypo/hyperglycemia.;Provide education about proper nutrition, including hydration, and aerobic/resistive exercise prescription along with prescribed  medications to achieve blood glucose in normal ranges: Fasting glucose 65-99 mg/dL    Expected Outcomes  Long Term: Attainment of HbA1C < 7%.;Short Term: Participant verbalizes understanding of the signs/symptoms and immediate care of hyper/hypoglycemia, proper foot care and importance of medication, aerobic/resistive exercise and nutrition plan for blood glucose control.    Hypertension  Yes    Intervention  Provide education on lifestyle modifcations including regular physical activity/exercise, weight management, moderate sodium restriction and increased consumption of fresh fruit, vegetables, and low fat dairy, alcohol moderation, and smoking cessation.;Monitor prescription use compliance.    Expected Outcomes  Short Term: Continued assessment and intervention until BP is < 140/63m HG in hypertensive participants. < 130/889mHG in hypertensive participants with diabetes, heart failure or chronic kidney disease.;Long Term: Maintenance of blood pressure at goal levels.    Lipids  Yes    Intervention  Provide education and support for participant on nutrition & aerobic/resistive exercise along with prescribed medications to achieve LDL '70mg'$ , HDL >'40mg'$ .    Expected Outcomes  Short Term: Participant states understanding of desired cholesterol values and is compliant with medications prescribed. Participant is following exercise prescription and nutrition guidelines.;Long Term: Cholesterol controlled with medications as prescribed, with individualized exercise RX and with personalized nutrition plan. Value goals: LDL < '70mg'$ , HDL > 40 mg.       Core Components/Risk Factors/Patient Goals Review:  Goals and Risk Factor Review    Row Name 12/24/18 1641 01/28/19 1620           Core Components/Risk Factors/Patient Goals Review   Personal Goals Review  Weight Management/Obesity;Improve shortness of breath with ADL's;Diabetes;Hypertension;Lipids  Weight Management/Obesity;Heart  Failure;Hypertension;Lipids;Other      Review  MaCatelyns taking meds as directed.  Her FBG has been around 100.  She has had low BG once overnight.  During the day she can tell if it gets low.  She has spoken to her Dr about her lack of appetite and concern that she has lost weight. They advised that it is ok at this point.  She has spoken with RD  about way to get nutrients.  Mai still is concerned about her heart health.  Staff encouraged her to communicate with Dr any new or changing symptoms.   Darnice had bloodwork last week and her Dr had her start magnesium supplements.  She sees her Dr next Monday.  She is taking all meds as directed (has missed a couple night doses due to helping sister)  She has been here at the hospital with her sister this week.  her Dr is following her "leaky heart valve".      Expected Outcomes  Short - find foods she likes to help maintain strength/follow up with Dr about appetite  Long - maintain healthy weight and heart healthy diet  Short - see Dr next week and follow Dr advice Long - manage health and heart concerns long term           Core Components/Risk Factors/Patient Goals at Discharge (Final Review):  Goals and Risk Factor Review - 01/28/19 1620      Core Components/Risk Factors/Patient Goals Review   Personal Goals Review  Weight Management/Obesity;Heart Failure;Hypertension;Lipids;Other    Review  Eesha had bloodwork last week and her Dr had her start magnesium supplements.  She sees her Dr next Monday.  She is taking all meds as directed (has missed a couple night doses due to helping sister)  She has been here at the hospital with her sister this week.  her Dr is following her "leaky heart valve".    Expected Outcomes  Short - see Dr next week and follow Dr advice Long - manage health and heart concerns long term         ITP Comments: ITP Comments    Row Name 10/11/18 1339 10/17/18 0611 11/01/18 1244 11/12/18 1557 11/14/18 0620   ITP Comments  Med Review  completed. Initial ITP created. Diagnosis can be found in [facility] 10/14  30 day review. Continue with ITP unless direccted changes per Medical Director Chart Review.  Patient called to inform us that her stress test went well and she will be back on 11/05/18.  Patient states she is having a procedure for her heart and will return to [facility] with a doctors note.  30 day review. Continue with ITP unless direccted changes per Medical Director Chart Review.  OUt for medical reason   Row Name 11/19/18 1746 12/11/18 0650 01/09/19 1404 02/05/19 1510 02/06/19 0622   ITP Comments  Post exercise blood glucose was 79 mg/dL. Patient was given 4 glucose tabs and blood glucose was 110 mg/dL. Patient was offered PB crackers but refused and said she was going home to eat dinner.   30 Day Review. Continue with ITP unless directed changes per Medical Director review.  Only one visit this month  30 day review completed. ITP sent to Dr. Emily Filbert, Medical Director of Cardiac Rehab. Continue with ITP unless changes are made by physician.  Office called to let us know that Onalee is out sick this week and they requested for her to stay out of rehab as well.   30 day review. Continue with ITP unless directed changes by Medical Director chart review.      Comments:

## 2019-02-11 ENCOUNTER — Encounter: Payer: Medicare (Managed Care) | Attending: Cardiovascular Disease

## 2019-02-11 DIAGNOSIS — Z955 Presence of coronary angioplasty implant and graft: Secondary | ICD-10-CM | POA: Insufficient documentation

## 2019-02-11 DIAGNOSIS — I214 Non-ST elevation (NSTEMI) myocardial infarction: Secondary | ICD-10-CM | POA: Insufficient documentation

## 2019-02-25 NOTE — Progress Notes (Signed)
Cardiac Individual Treatment Plan  Patient Details  Name: Kayla Malone MRN: [id number] Date of Birth: 1961-02-12 Referring Provider:     Cardiac Rehab from 10/11/2018 in [facility] Cardiac and Pulmonary Rehab  Referring Provider  Humphrey Rolls      Initial Encounter Date:    Cardiac Rehab from 10/11/2018 in [facility] Cardiac and Pulmonary Rehab  Date  10/11/18      Visit Diagnosis: No diagnosis found.  Patient's Home Medications on Admission:  Current Outpatient Medications:  .  acetaminophen (TYLENOL) 500 MG tablet, Take 1,000 mg by mouth every 6 (six) hours as needed for mild pain or headache., Disp: , Rfl:  .  albuterol (ACCUNEB) 0.63 MG/3ML nebulizer solution, Take 1 ampule by nebulization every 6 (six) hours as needed for wheezing., Disp: , Rfl:  .  albuterol (PROVENTIL HFA;VENTOLIN HFA) 108 (90 Base) MCG/ACT inhaler, Inhale 2 puffs into the lungs every 6 (six) hours as needed for wheezing or shortness of breath. , Disp: , Rfl:  .  aspirin EC 81 MG tablet, Take 81 mg by mouth daily., Disp: , Rfl:  .  atorvastatin (LIPITOR) 40 MG tablet, Take 80 mg by mouth daily. , Disp: , Rfl:  .  fluticasone (FLONASE) 50 MCG/ACT nasal spray, Place 2 sprays into both nostrils daily., Disp: , Rfl:  .  Fluticasone-Salmeterol (ADVAIR DISKUS) 500-50 MCG/DOSE AEPB, Inhale 1 puff into the lungs daily as needed (SOB). , Disp: , Rfl:  .  losartan (COZAAR) 50 MG tablet, Take 50 mg by mouth daily., Disp: , Rfl: 2 .  metFORMIN (GLUCOPHAGE) 500 MG tablet, Take 1,000 mg by mouth every morning. , Disp: , Rfl: 5 .  metFORMIN (GLUCOPHAGE) 500 MG tablet, Take 500 mg by mouth every evening., Disp: , Rfl:  .  metoprolol succinate (TOPROL-XL) 50 MG 24 hr tablet, Take 50 mg by mouth daily. Take with or immediately following a meal., Disp: , Rfl:  .  nitroGLYCERIN (NITROSTAT) 0.4 MG SL tablet, Place 1 tablet (0.4 mg total) under the tongue every 5 (five) minutes x 3 doses as needed for chest pain., Disp: 25 tablet, Rfl: 0 .   pantoprazole (PROTONIX) 40 MG tablet, Take 40 mg by mouth. , Disp: , Rfl:  .  sitaGLIPtin (JANUVIA) 50 MG tablet, Take 50 mg by mouth daily., Disp: , Rfl:  .  ticagrelor (BRILINTA) 90 MG TABS tablet, Take 1 tablet (90 mg total) by mouth 2 (two) times daily., Disp: 30 tablet, Rfl: 0 .  traZODone (DESYREL) 50 MG tablet, Take 50 mg by mouth at bedtime., Disp: , Rfl: 5 .  VOLTAREN 1 % GEL, Apply 2 g topically as needed (pain in shoulder). , Disp: , Rfl: 2  Past Medical History: Past Medical History:  Diagnosis Date  . Asthma   . Coronary artery disease   . Depression   . Diabetes mellitus without complication ([place])   . GERD (gastroesophageal reflux disease)   . History of hiatal hernia   . Hypertension   . Pain    BACK  . Stroke ([place])    x3  . Thyroid cyst     Tobacco Use: Social History   Tobacco Use  Smoking Status Former Smoker  . Last attempt to quit: 09/13/1998  . Years since quitting: 20.4  Smokeless Tobacco Never Used    Labs: Recent Chemical engineer    Labs for ITP Cardiac and Pulmonary Rehab Latest Ref Rng & Units 10/23/2017 10/23/2017 10/23/2017 10/23/2017 10/24/2017   Hemoglobin A1c 4.8 - 5.6 %  7.3(H) - - - -   PHART 7.350 - 7.450 - 7.359 7.402 - -   PCO2ART 32.0 - 48.0 mmHg - 44.0 41.4 - -   HCO3 20.0 - 28.0 mmol/L - 25.1 26.0 - -   TCO2 22 - 32 mmol/L - '26 27 25 28   '$ ACIDBASEDEF 0.0 - 2.0 mmol/L - 1.0 - - -   O2SAT % - 99.0 99.0 - -       Exercise Target Goals: Exercise Program Goal: Individual exercise prescription set using results from initial 6 min walk test and THRR while considering  patient's activity barriers and safety.   Exercise Prescription Goal: Initial exercise prescription builds to 30-45 minutes a day of aerobic activity, 2-3 days per week.  Home exercise guidelines will be given to patient during program as part of exercise prescription that the participant will acknowledge.  Activity Barriers & Risk Stratification:   6 Minute  Walk: 6 Minute Walk    Row Name 10/11/18 1351         6 Minute Walk   Phase  Initial     Distance  700 feet     Walk Time  5.75 minutes     # of Rest Breaks  1     MPH  1.32     METS  2.72     RPE  12     Perceived Dyspnea   1     VO2 Peak  9.5     Symptoms  Yes (comment)     Comments  foot drag when she fatigues     Resting HR  72 bpm     Resting BP  148/82     Resting Oxygen Saturation   97 %     Exercise Oxygen Saturation  during 6 min walk  98 %     Max Ex. HR  95 bpm     Max Ex. BP  150/78     2 Minute Post BP  130/78        Oxygen Initial Assessment:   Oxygen Re-Evaluation:   Oxygen Discharge (Final Oxygen Re-Evaluation):   Initial Exercise Prescription: Initial Exercise Prescription - 10/11/18 1300      Date of Initial Exercise RX and Referring Provider   Date  10/11/18    Referring Provider  Humphrey Rolls      NuStep   Level  1    SPM  80    Minutes  15    METs  2.7      Biostep-RELP   Level  2    SPM  50    Minutes  15    METs  2      Track   Laps  25    Minutes  15    METs  2.15      Prescription Details   Frequency (times per week)  3    Duration  Progress to 45 minutes of aerobic exercise without signs/symptoms of physical distress      Intensity   THRR 40-80% of Max Heartrate  108-144    Ratings of Perceived Exertion  11-13    Perceived Dyspnea  0-4      Resistance Training   Training Prescription  Yes    Weight  3 lb    Reps  10-15       Perform Capillary Blood Glucose checks as needed.  Exercise Prescription Changes: Exercise Prescription Changes    Row Name 10/11/18 1400 10/24/18 1100 11/12/18  1400 11/21/18 1100 12/19/18 1300     Response to Exercise   Blood Pressure (Admit)  148/82  140/80  130/62  122/60  110/58   Blood Pressure (Exercise)  150/78  140/80  120/68  148/84  126/62   Blood Pressure (Exit)  130/78  124/74  120/62  128/78  124/78   Heart Rate (Admit)  72 bpm  75 bpm  64 bpm  80 bpm  70 bpm   Heart Rate  (Exercise)  95 bpm  96 bpm  92 bpm  103 bpm  88 bpm   Heart Rate (Exit)  63 bpm  74 bpm  63 bpm  63 bpm  55 bpm   Rating of Perceived Exertion (Exercise)  '12  14  15  13  15   '$ Perceived Dyspnea (Exercise)  1  -  -  -  -   Symptoms  foot drag  -  -  -  -   Comments  -  first session  -  -  -   Duration  -  Progress to 45 minutes of aerobic exercise without signs/symptoms of physical distress  Progress to 45 minutes of aerobic exercise without signs/symptoms of physical distress  Progress to 45 minutes of aerobic exercise without signs/symptoms of physical distress  Progress to 45 minutes of aerobic exercise without signs/symptoms of physical distress   Intensity  -  THRR unchanged  THRR unchanged  THRR unchanged  Other (comment) use RPE     Progression   Progression  -  Continue to progress workloads to maintain intensity without signs/symptoms of physical distress.  Continue to progress workloads to maintain intensity without signs/symptoms of physical distress.  Continue to progress workloads to maintain intensity without signs/symptoms of physical distress.  Continue to progress workloads to maintain intensity without signs/symptoms of physical distress.   Average METs  -  2  2  2.6  3     Resistance Training   Training Prescription  -  Yes  Yes  Yes  Yes   Weight  -  3 lb  3 lb  3 lb  3 lb   Reps  -  10-15  10-15  10-15  10-15     Interval Training   Interval Training  -  No  No  No  No     NuStep   Level  -  -  -  3  3   SPM  -  -  -  80  80   Minutes  -  -  -  15  15   METs  -  -  -  3.2  3     Biostep-RELP   Level  -  '2  2  2  '$ -   SPM  -  50  50  50  -   Minutes  -  '15  30  15  '$ -   METs  -  '2  2  2  '$ -   Row Name 01/03/19 0800 01/15/19 1200 01/30/19 1100         Response to Exercise   Blood Pressure (Admit)  126/60  116/60  124/62     Blood Pressure (Exercise)  136/60  134/64  142/70     Blood Pressure (Exit)  120/66  124/70  -     Heart Rate (Admit)  55 bpm  57 bpm  60  bpm     Heart Rate (Exercise)  119 bpm  100 bpm  78 bpm     Heart Rate (Exit)  67 bpm  79 bpm  53 bpm     Rating of Perceived Exertion (Exercise)  '12  13  13     '$ Duration  Progress to 45 minutes of aerobic exercise without signs/symptoms of physical distress  Progress to 45 minutes of aerobic exercise without signs/symptoms of physical distress  Progress to 45 minutes of aerobic exercise without signs/symptoms of physical distress     Intensity  Other (comment) use RPE  Other (comment) limited due to stroke   Other (comment)       Progression   Progression  Continue to progress workloads to maintain intensity without signs/symptoms of physical distress.  Continue to progress workloads to maintain intensity without signs/symptoms of physical distress.  Continue to progress workloads to maintain intensity without signs/symptoms of physical distress.     Average METs  3  3  3.15       Resistance Training   Training Prescription  Yes  Yes  Yes     Weight  3 lb  3 lb  3 lb     Reps  10-15  10-15  10-15       Interval Training   Interval Training  No  No  No       NuStep   Level  3  -  3     SPM  80  -  80     Minutes  15  -  30     METs  3.2  -  3.7       Biostep-RELP   Level  2  2  -     SPM  50  50  -     Minutes  15  15  -     METs  3  3  -        Exercise Comments: Exercise Comments    Row Name 12/24/18 1705           Exercise Comments  Reviewed home exercise with pt today.  Pt plans to join [employer] for exercise.  Reviewed THR, pulse, RPE, sign and symptoms, NTG use, and when to call 911 or MD.  Also discussed weather considerations and indoor options.  Pt voiced understanding.          Exercise Goals and Review: Exercise Goals    Row Name 10/11/18 1350             Exercise Goals   Increase Physical Activity  Yes       Intervention  Provide advice, education, support and counseling about physical activity/exercise needs.;Develop an individualized exercise  prescription for aerobic and resistive training based on initial evaluation findings, risk stratification, comorbidities and participant's personal goals.       Expected Outcomes  Short Term: Attend rehab on a regular basis to increase amount of physical activity.;Long Term: Add in home exercise to make exercise part of routine and to increase amount of physical activity.;Long Term: Exercising regularly at least 3-5 days a week.       Increase Strength and Stamina  Yes       Intervention  Provide advice, education, support and counseling about physical activity/exercise needs.;Develop an individualized exercise prescription for aerobic and resistive training based on initial evaluation findings, risk stratification, comorbidities and participant's personal goals.       Expected Outcomes  Short Term: Increase workloads from initial exercise prescription for resistance, speed,  and METs.;Short Term: Perform resistance training exercises routinely during rehab and add in resistance training at home;Long Term: Improve cardiorespiratory fitness, muscular endurance and strength as measured by increased METs and functional capacity (6MWT)       Able to understand and use rate of perceived exertion (RPE) scale  Yes       Intervention  Provide education and explanation on how to use RPE scale       Expected Outcomes  Short Term: Able to use RPE daily in rehab to express subjective intensity level;Long Term:  Able to use RPE to guide intensity level when exercising independently       Knowledge and understanding of Target Heart Rate Range (THRR)  Yes       Intervention  Provide education and explanation of THRR including how the numbers were predicted and where they are located for reference       Expected Outcomes  Short Term: Able to state/look up THRR;Short Term: Able to use daily as guideline for intensity in rehab;Long Term: Able to use THRR to govern intensity when exercising independently       Able to check  pulse independently  Yes       Intervention  Provide education and demonstration on how to check pulse in carotid and radial arteries.;Review the importance of being able to check your own pulse for safety during independent exercise       Expected Outcomes  Short Term: Able to explain why pulse checking is important during independent exercise;Long Term: Able to check pulse independently and accurately       Understanding of Exercise Prescription  Yes       Intervention  Provide education, explanation, and written materials on patient's individual exercise prescription       Expected Outcomes  Short Term: Able to explain program exercise prescription;Long Term: Able to explain home exercise prescription to exercise independently          Exercise Goals Re-Evaluation : Exercise Goals Re-Evaluation    Row Name 11/12/18 1504 11/21/18 1130 12/19/18 1349 12/24/18 1705 01/03/19 0815     Exercise Goal Re-Evaluation   Exercise Goals Review  Increase Physical Activity;Increase Strength and Stamina;Able to understand and use rate of perceived exertion (RPE) scale;Knowledge and understanding of Target Heart Rate Range (THRR)  Increase Physical Activity;Increase Strength and Stamina;Able to understand and use rate of perceived exertion (RPE) scale;Knowledge and understanding of Target Heart Rate Range (THRR)  Increase Physical Activity;Increase Strength and Stamina;Able to understand and use rate of perceived exertion (RPE) scale  Increase Physical Activity;Able to understand and use rate of perceived exertion (RPE) scale;Knowledge and understanding of Target Heart Rate Range (THRR);Understanding of Exercise Prescription;Increase Strength and Stamina  Increase Physical Activity;Increase Strength and Stamina;Able to understand and use rate of perceived exertion (RPE) scale;Knowledge and understanding of Target Heart Rate Range (THRR)   Comments  Yitta has only attended 4 sessions this month.  She would progress  more with consistent attendance.    Nakeyia did well on the NS.  She still needs to attend consistently to see progress.  Sennie only attended HT once in December.  More consistent attendance is necessary for her to progress.  Reviewed home exercise with pt today.  Pt plans to join [employer] for exercise.  Reviewed THR, pulse, RPE, sign and symptoms, NTG use, and when to call 911 or MD.  Also discussed weather considerations and indoor options.  Pt voiced understanding.  Nishita tolerates exercise well when  she attends.  More consistent attendance would yield better progress.   Expected Outcomes  Short - attend consistently 3 times per week Long - increase overall MET level  Short - attend three days per week Long - increase MET level  Short - attend HT 3 times per week Long - improve MET level and strength  Short - add one day per week exercise at home Long - continue exercise in [employer] - attend consistently Long - increase MET level   Row Name 01/15/19 1242 01/30/19 1141           Exercise Goal Re-Evaluation   Exercise Goals Review  Increase Physical Activity;Increase Strength and Stamina;Able to understand and use rate of perceived exertion (RPE) scale;Knowledge and understanding of Target Heart Rate Range (THRR);Understanding of Exercise Prescription  Increase Physical Activity;Increase Strength and Stamina;Able to understand and use rate of perceived exertion (RPE) scale;Knowledge and understanding of Target Heart Rate Range (THRR);Able to check pulse independently      Comments  Zylah sometimes arrives late and misses the strength portion of class which would help her leg strength.  Staff will encourage arriving on time  [facility] does well with NS and did 30 min instead of changing machines today.  Her MET level was 3.7.      Expected Outcomes  Short - be to class in time for strength training Long - Improve strength and stamina   Short - attend consistently and work on walking Long - increase MET  level and walking time         Discharge Exercise Prescription (Final Exercise Prescription Changes): Exercise Prescription Changes - 01/30/19 1100      Response to Exercise   Blood Pressure (Admit)  124/62    Blood Pressure (Exercise)  142/70    Heart Rate (Admit)  60 bpm    Heart Rate (Exercise)  78 bpm    Heart Rate (Exit)  53 bpm    Rating of Perceived Exertion (Exercise)  13    Duration  Progress to 45 minutes of aerobic exercise without signs/symptoms of physical distress    Intensity  Other (comment)      Progression   Progression  Continue to progress workloads to maintain intensity without signs/symptoms of physical distress.    Average METs  3.15      Resistance Training   Training Prescription  Yes    Weight  3 lb    Reps  10-15      Interval Training   Interval Training  No      NuStep   Level  3    SPM  80    Minutes  30    METs  3.7       Nutrition:  Target Goals: Understanding of nutrition guidelines, daily intake of sodium '1500mg'$ , cholesterol '200mg'$ , calories 30% from fat and 7% or less from saturated fats, daily to have 5 or more servings of fruits and vegetables.  Biometrics: Pre Biometrics - 10/11/18 1350      Pre Biometrics   Height  5' 4.25" (1.632 m)    Weight  146 lb 14.4 oz (66.6 kg)    Waist Circumference  32 inches    Hip Circumference  36.5 inches    Waist to Hip Ratio  0.88 %    BMI (Calculated)  25.02        Nutrition Therapy Plan and Nutrition Goals: Nutrition Therapy & Goals - 11/19/18 1641  Nutrition Therapy   Diet  DM    Drug/Food Interactions  Statins/Certain Fruits    Protein (specify units)  9-10oz    Fiber  20 grams    Whole Grain Foods  3 servings   chooses some whole grains IE oatmeal   Saturated Fats  12 max. grams    Fruits and Vegetables  5 servings/day   8 ideal; likes fruits and vegetables but has a poor appetite most days   Sodium  1500 grams      Personal Nutrition Goals   Nutrition Goal  Try  adding marinades, different seasonings/ herbs/ spices to foods to improve taste profile. Foods that have aromas like soup may help stimulate appetite as well    Personal Goal #2  On days you do not feel like eating it may be helpful to make a smoothie as an alternative/ liquid nutritional source, espeically since you do not like nutritional supplement drinks    Comments  She c/o severe decline in appetite after losing her sense of smell/taste s/p mini stroke. She has been losing wt, approx 10# since her last surgery date. Currently she eats 1-2x/day, with some days being better than others in terms of appetite and "having a taste for things." She is a diabetic with BG readings typically 90-100 recently. Foods she likes and can tolerate: fruits, cheese, most vegetables, baked potatoes, salad, dry cereal, hard boiled eggs, oatmeal, soup. Does not usually eat breakfast. Drinks water and the occasional tea. She has tried nutritional drinks like Ensure and dislikes them.      Intervention Plan   Intervention  Prescribe, educate and counsel regarding individualized specific dietary modifications aiming towards targeted core components such as weight, hypertension, lipid management, diabetes, heart failure and other comorbidities.;Nutrition handout(s) given to patient.   Nutrition tips for taste changes handout; Smoothie recipes   Expected Outcomes  Short Term Goal: Understand basic principles of dietary content, such as calories, fat, sodium, cholesterol and nutrients.;Short Term Goal: A plan has been developed with personal nutrition goals set during dietitian appointment.;Long Term Goal: Adherence to prescribed nutrition plan.       Nutrition Assessments: Nutrition Assessments - 10/11/18 1409      MEDFICTS Scores   Pre Score  0   no appetite. barely eats anything through out the day.       Nutrition Goals Re-Evaluation: Nutrition Goals Re-Evaluation    [place] Name 11/19/18 1656 12/18/18 0929 01/28/19  1629         Goals   Current Weight  -  -  143 lb (64.9 kg)     Nutrition Goal  Try adding marinades, different seasonings/ herbs/ spices to foods to improve taste profile. Foods that have aromas like soup may help stimulate appetite as well  Try adding marinades, seasonings, herbs or spices to foods to improve taste profile. Foods that have aromas like soup may be helpful to stimulate appetite as well; On days when you do not feel like eating try having a smoothie instead  -     Comment  She c/o lack of smell and taste leading to poor appetite and desire to eat  She continues to c/o lack of smell and taste but is trying to include more foods. She has purchased 100% fruit snacks for times that she feels she is having low blood sugar, as she enjoys them. She was educated on more nutritious options, and instructed to eat 1/2 of the pack of fruit snacks (~15-20g CHO) should  she want to choose this as an option to correct a low blood sugar rather than eating the whole pack and overshooting (~50g CHO)  Azaiah eats small meals twice a day.  She does eat vegetables.  She feels she knows what to eat.     Expected Outcome  She will try different flavorings and cooking methods to help encourage her to eat and to prevent further wt loss  She will choose whole food options for meals and snacks most often to both meet her daily nutritional needs and best manage BG levels. Continue to experiment with food preparation methods that encourage appetite despite altered taste and smell  Short -  focus on heart healthy foods Long - maintain BG and A1C        Personal Goal #2 Re-Evaluation   Personal Goal #2  On days you do not feel like eating it may be helpful to make a smoothie as an alternative / liquid nutritional source, especially since you do not like nutritional supplement drinks  -  -        Nutrition Goals Discharge (Final Nutrition Goals Re-Evaluation): Nutrition Goals Re-Evaluation - 01/28/19 1629      Goals    Current Weight  143 lb (64.9 kg)    Comment  Dottie eats small meals twice a day.  She does eat vegetables.  She feels she knows what to eat.    Expected Outcome  Short -  focus on heart healthy foods Long - maintain BG and A1C        Psychosocial: Target Goals: Acknowledge presence or absence of significant depression and/or stress, maximize coping skills, provide positive support system. Participant is able to verbalize types and ability to use techniques and skills needed for reducing stress and depression.   Initial Review & Psychosocial Screening: Initial Psych Review & Screening - 10/11/18 1401      Initial Review   Current issues with  History of Depression;Current Stress Concerns    Source of Stress Concerns  Poor Coping Skills;Unable to participate in former interests or hobbies;Chronic Illness;Financial    Comments  Shadonna is hesistant to do much outside of her home. It is hard for her to get going during the day. She still has very little appetite and very low energy.       Family Dynamics   Good Support System?  Yes   she relies on her sister, but she is not in good health either. She did not mention any of her children this time.      Screening Interventions   Interventions  Encouraged to exercise;Program counselor consult;To provide support and resources with identified psychosocial needs    Expected Outcomes  Short Term goal: Utilizing psychosocial counselor, staff and physician to assist with identification of specific Stressors or current issues interfering with healing process. Setting desired goal for each stressor or current issue identified.;Long Term Goal: Stressors or current issues are controlled or eliminated.;Short Term goal: Identification and review with participant of any Quality of Life or Depression concerns found by scoring the questionnaire.;Long Term goal: The participant improves quality of Life and PHQ9 Scores as seen by post scores and/or verbalization of  changes       Quality of Life Scores:  Quality of Life - 10/11/18 1403      Quality of Life   Select  Quality of Life      Quality of Life Scores   Health/Function Pre  16.57 %    Socioeconomic  Pre  23.5 %    Psych/Spiritual Pre  22.29 %    Family Pre  24.38 %    GLOBAL Pre  20.09 %      Scores of 19 and below usually indicate a poorer quality of life in these areas.  A difference of  2-3 points is a clinically meaningful difference.  A difference of 2-3 points in the total score of the Quality of Life Index has been associated with significant improvement in overall quality of life, self-image, physical symptoms, and general health in studies assessing change in quality of life.  PHQ-9: Recent Review Flowsheet Data    Depression screen [facility] 2/9 10/11/2018 05/09/2018 12/28/2017   Decreased Interest '3 2 2   '$ Down, Depressed, Hopeless '3 2 2   '$ PHQ - 2 Score '6 4 4   '$ Altered sleeping 0 2 2   Tired, decreased energy '3 2 2   '$ Change in appetite '3 2 3   '$ Feeling bad or failure about yourself  2 3 0   Trouble concentrating '3 2 1   '$ Moving slowly or fidgety/restless 0 1 0   Suicidal thoughts 0 0 0   PHQ-9 Score '17 16 12   '$ Difficult doing work/chores Very difficult Somewhat difficult Not difficult at all     Interpretation of Total Score  Total Score Depression Severity:  1-4 = Minimal depression, 5-9 = Mild depression, 10-14 = Moderate depression, 15-19 = Moderately severe depression, 20-27 = Severe depression   Psychosocial Evaluation and Intervention: Psychosocial Evaluation - 10/17/18 1619      Psychosocial Evaluation & Interventions   Interventions  Stress management education;Relaxation education;Encouraged to exercise with the program and follow exercise prescription    Comments  Counselor met with Ms. Keturah Barre Stanton Kidney) today for her return to Cardiac Rehab after 6 months - due to a mild heart attack and stent insertion.  Liviya will be 58 years old this weekend and has a strong support  system with (4) adult children and a significant other.  She reports sleeping "better" with the use of a prescription med but doesn't have much of an appetite.  She a history of depression and reports taking meds to help with this.  Her PHQ-9 scores are 17 indicating moderately severe symptoms of depression are present.  She has multiple stressors with her health; being disabled and finances.  Karimah has goals to "get heart healthy" and get back to church - where she had just returned the day she had her mild heart attack.  Counselor will follow.      Expected Outcomes  Short:  Nayleen will continue taking her mood medication and will contact her Dr. if her symptoms do not improve in the near future - with the use of consistent exercise.   Long:  Chellie will continue to practice positive self-care for her health and mental health for an improved quality of life.    Continue Psychosocial Services   Follow up required by counselor       Psychosocial Re-Evaluation: Psychosocial Re-Evaluation    [place] Name 11/07/18 1655 12/24/18 1650 01/28/19 1624         Psychosocial Re-Evaluation   Current issues with  Current Psychotropic Meds  Current Stress Concerns;Current Sleep Concerns  Current Stress Concerns;Current Sleep Concerns     Comments  Counselor follow up with Stanton Kidney today reporting she has begun two new medications - one for her heart and one for her mental health/stress and anxiety.  She reports both seem  to be working well and she is sleeping better also.  She has less stress and anxiety and her mood is more positive as a result.  Morene states the medication for her heart is helping her feel less fearful about having another heart attack and this confidence in turn helps her relax more.  Counselor commended [facility] on her progress and positive self-care.  Arlana is still concerned about her heart health.  Staff encouraged her to follow up with Dr about any concerns.  She reports sleeping "ok" about 5 hours per night.   Zyionna's main stress is helping her sister.  She has not been sleeping well.  Her sister is in the ER now.  She feels coming to exercise helps her with stress.     Expected Outcomes  Short:  Jearline will take her medications as directed by her Drs.  She will continue to exercise for her health and mental health.  Long:  Mazie will develop routine healthy habits including exercise.    Short - continue to attend Cardiac rehab Long - manage stress on her own  Short - follow up with Dr and continue exercise Long - manage stress on her own     Interventions  -  Encouraged to attend Cardiac Rehabilitation for the exercise  Encouraged to attend Cardiac Rehabilitation for the exercise     Continue Psychosocial Services   Follow up required by staff  Follow up required by staff  Follow up required by staff        Psychosocial Discharge (Final Psychosocial Re-Evaluation): Psychosocial Re-Evaluation - 01/28/19 1624      Psychosocial Re-Evaluation   Current issues with  Current Stress Concerns;Current Sleep Concerns    Comments  Shanin's main stress is helping her sister.  She has not been sleeping well.  Her sister is in the ER now.  She feels coming to exercise helps her with stress.    Expected Outcomes  Short - follow up with Dr and continue exercise Long - manage stress on her own    Interventions  Encouraged to attend Cardiac Rehabilitation for the exercise    Continue Psychosocial Services   Follow up required by staff       Vocational Rehabilitation: Provide vocational rehab assistance to qualifying candidates.   Vocational Rehab Evaluation & Intervention: Vocational Rehab - 10/11/18 1400      Initial Vocational Rehab Evaluation & Intervention   Assessment shows need for Vocational Rehabilitation  No       Education: Education Goals: Education classes will be provided on a variety of topics geared toward better understanding of heart health and risk factor modification. Participant will state  understanding/return demonstration of topics presented as noted by education test scores.  Learning Barriers/Preferences: Learning Barriers/Preferences - 10/11/18 1348      Learning Barriers/Preferences   Learning Barriers  Exercise Concerns    Learning Preferences  Audio;Video       Education Topics:  AED/CPR: - Group verbal and written instruction with the use of models to demonstrate the basic use of the AED with the basic ABC's of resuscitation.   Cardiac Rehab from 05/09/2018 in [facility] Cardiac and Pulmonary Rehab  Date  05/02/18  Educator  CE  Instruction Review Code  1- Verbalizes Understanding      General Nutrition Guidelines/Fats and Fiber: -Group instruction provided by verbal, written material, models and posters to present the general guidelines for heart healthy nutrition. Gives an explanation and review of dietary fats and fiber.  Controlling Sodium/Reading Food Labels: -Group verbal and written material supporting the discussion of sodium use in heart healthy nutrition. Review and explanation with models, verbal and written materials for utilization of the food label.   Cardiac Rehab from 01/14/2019 in [facility] Cardiac and Pulmonary Rehab  Date  10/17/18  Educator  LB  Instruction Review Code  1- Verbalizes Understanding      Exercise Physiology & General Exercise Guidelines: - Group verbal and written instruction with models to review the exercise physiology of the cardiovascular system and associated critical values. Provides general exercise guidelines with specific guidelines to those with heart or lung disease.    Cardiac Rehab from 05/09/2018 in [facility] Cardiac and Pulmonary Rehab  Date  03/21/18  Educator  [facility]  Instruction Review Code  1- Verbalizes Understanding      Aerobic Exercise & Resistance Training: - Gives group verbal and written instruction on the various components of exercise. Focuses on aerobic and resistive training programs and the benefits of  this training and how to safely progress through these programs..   Cardiac Rehab from 01/14/2019 in [facility] Cardiac and Pulmonary Rehab  Date  01/02/19  Educator  AS  Instruction Review Code  1- Verbalizes Understanding      Flexibility, Balance, Mind/Body Relaxation: Provides group verbal/written instruction on the benefits of flexibility and balance training, including mind/body exercise modes such as yoga, pilates and tai chi.  Demonstration and skill practice provided.   Stress and Anxiety: - Provides group verbal and written instruction about the health risks of elevated stress and causes of high stress.  Discuss the correlation between heart/lung disease and anxiety and treatment options. Review healthy ways to manage with stress and anxiety.   Cardiac Rehab from 05/09/2018 in [facility] Cardiac and Pulmonary Rehab  Date  02/13/18  Educator  [facility]  Instruction Review Code  1- Verbalizes Understanding      Depression: - Provides group verbal and written instruction on the correlation between heart/lung disease and depressed mood, treatment options, and the stigmas associated with seeking treatment.   Cardiac Rehab from 05/09/2018 in [facility] Cardiac and Pulmonary Rehab  Date  03/28/18  Educator  [facility]  Instruction Review Code  1- Verbalizes Understanding      Anatomy & Physiology of the Heart: - Group verbal and written instruction and models provide basic cardiac anatomy and physiology, with the coronary electrical and arterial systems. Review of Valvular disease and Heart Failure   Cardiac Rehab from 01/14/2019 in [facility] Cardiac and Pulmonary Rehab  Date  01/14/19  Educator  CE  Instruction Review Code  5- Refused Teaching      Cardiac Procedures: - Group verbal and written instruction to review commonly prescribed medications for heart disease. Reviews the medication, class of the drug, and side effects. Includes the steps to properly store meds and maintain the prescription regimen. (beta  blockers and nitrates)   Cardiac Medications I: - Group verbal and written instruction to review commonly prescribed medications for heart disease. Reviews the medication, class of the drug, and side effects. Includes the steps to properly store meds and maintain the prescription regimen.   Cardiac Rehab from 05/09/2018 in [facility] Cardiac and Pulmonary Rehab  Date  04/16/18  Educator  CE  Instruction Review Code  1- Verbalizes Understanding      Cardiac Medications II: -Group verbal and written instruction to review commonly prescribed medications for heart disease. Reviews the medication, class of the drug, and side effects. (all other drug classes)  Cardiac Rehab from 05/09/2018 in [facility] Cardiac and Pulmonary Rehab  Date  02/15/18  Educator  [facility]  Instruction Review Code  1- Verbalizes Understanding       Go Sex-Intimacy & Heart Disease, Get SMART - Goal Setting: - Group verbal and written instruction through game format to discuss heart disease and the return to sexual intimacy. Provides group verbal and written material to discuss and apply goal setting through the application of the S.M.A.R.T. Method.   Other Matters of the Heart: - Provides group verbal, written materials and models to describe Stable Angina and Peripheral Artery. Includes description of the disease process and treatment options available to the cardiac patient.   Cardiac Rehab from 05/09/2018 in [facility] Cardiac and Pulmonary Rehab  Date  04/09/18  Educator  CE  Instruction Review Code  1- Verbalizes Understanding      Exercise & Equipment Safety: - Individual verbal instruction and demonstration of equipment use and safety with use of the equipment.   Cardiac Rehab from 01/14/2019 in [facility] Cardiac and Pulmonary Rehab  Date  10/11/18  Educator  [facility]  Instruction Review Code  1- Verbalizes Understanding      Infection Prevention: - Provides verbal and written material to individual with discussion of infection  control including proper hand washing and proper equipment cleaning during exercise session.   Cardiac Rehab from 01/14/2019 in [facility] Cardiac and Pulmonary Rehab  Date  10/11/18  Educator  [facility]  Instruction Review Code  1- Verbalizes Understanding      Falls Prevention: - Provides verbal and written material to individual with discussion of falls prevention and safety.   Cardiac Rehab from 01/14/2019 in [facility] Cardiac and Pulmonary Rehab  Date  10/11/18  Educator  [facility]  Instruction Review Code  1- Verbalizes Understanding      Diabetes: - Individual verbal and written instruction to review signs/symptoms of diabetes, desired ranges of glucose level fasting, after meals and with exercise. Acknowledge that pre and post exercise glucose checks will be done for 3 sessions at entry of program.   Cardiac Rehab from 01/14/2019 in [facility] Cardiac and Pulmonary Rehab  Date  10/11/18  Educator  [facility]  Instruction Review Code  1- Verbalizes Understanding      Know Your Numbers and Risk Factors: -Group verbal and written instruction about important numbers in your health.  Discussion of what are risk factors and how they play a role in the disease process.  Review of Cholesterol, Blood Pressure, Diabetes, and BMI and the role they play in your overall health.   Cardiac Rehab from 05/09/2018 in [facility] Cardiac and Pulmonary Rehab  Date  02/15/18  Educator  [facility]  Instruction Review Code  1- Verbalizes Understanding      Sleep Hygiene: -Provides group verbal and written instruction about how sleep can affect your health.  Define sleep hygiene, discuss sleep cycles and impact of sleep habits. Review good sleep hygiene tips.    Cardiac Rehab from 01/14/2019 in [facility] Cardiac and Pulmonary Rehab  Date  10/24/18  Educator  Lucianne Lei, MSW  Instruction Review Code  1- Verbalizes Understanding      Other: -Provides group and verbal instruction on various topics (see comments)   Knowledge Questionnaire  Score: Knowledge Questionnaire Score - 10/11/18 1359      Knowledge Questionnaire Score   Pre Score  21/26   correct answers reviewed with [facility]. Focus on nutrition and exercise.       Core Components/Risk Factors/Patient Goals at Admission:  Personal Goals and Risk Factors at Admission - 10/11/18 1347      Core Components/Risk Factors/Patient Goals on Admission    Weight Management  Yes;Weight Maintenance    Intervention  Weight Management: Develop a combined nutrition and exercise program designed to reach desired caloric intake, while maintaining appropriate intake of nutrient and fiber, sodium and fats, and appropriate energy expenditure required for the weight goal.;Weight Management: Provide education and appropriate resources to help participant work on and attain dietary goals.    Expected Outcomes  Short Term: Continue to assess and modify interventions until short term weight is achieved;Long Term: Adherence to nutrition and physical activity/exercise program aimed toward attainment of established weight goal;Weight Maintenance: Understanding of the daily nutrition guidelines, which includes 25-35% calories from fat, 7% or less cal from saturated fats, less than '200mg'$  cholesterol, less than 1.5gm of sodium, & 5 or more servings of fruits and vegetables daily;Understanding recommendations for meals to include 15-35% energy as protein, 25-35% energy from fat, 35-60% energy from carbohydrates, less than '200mg'$  of dietary cholesterol, 20-35 gm of total fiber daily;Understanding of distribution of calorie intake throughout the day with the consumption of 4-5 meals/snacks    Diabetes  Yes    Intervention  Provide education about signs/symptoms and action to take for hypo/hyperglycemia.;Provide education about proper nutrition, including hydration, and aerobic/resistive exercise prescription along with prescribed medications to achieve blood glucose in normal ranges: Fasting glucose 65-99 mg/dL     Expected Outcomes  Long Term: Attainment of HbA1C < 7%.;Short Term: Participant verbalizes understanding of the signs/symptoms and immediate care of hyper/hypoglycemia, proper foot care and importance of medication, aerobic/resistive exercise and nutrition plan for blood glucose control.    Hypertension  Yes    Intervention  Provide education on lifestyle modifcations including regular physical activity/exercise, weight management, moderate sodium restriction and increased consumption of fresh fruit, vegetables, and low fat dairy, alcohol moderation, and smoking cessation.;Monitor prescription use compliance.    Expected Outcomes  Short Term: Continued assessment and intervention until BP is < 140/27m HG in hypertensive participants. < 130/874mHG in hypertensive participants with diabetes, heart failure or chronic kidney disease.;Long Term: Maintenance of blood pressure at goal levels.    Lipids  Yes    Intervention  Provide education and support for participant on nutrition & aerobic/resistive exercise along with prescribed medications to achieve LDL '70mg'$ , HDL >'40mg'$ .    Expected Outcomes  Short Term: Participant states understanding of desired cholesterol values and is compliant with medications prescribed. Participant is following exercise prescription and nutrition guidelines.;Long Term: Cholesterol controlled with medications as prescribed, with individualized exercise RX and with personalized nutrition plan. Value goals: LDL < '70mg'$ , HDL > 40 mg.       Core Components/Risk Factors/Patient Goals Review:  Goals and Risk Factor Review    Row Name 12/24/18 1641 01/28/19 1620           Core Components/Risk Factors/Patient Goals Review   Personal Goals Review  Weight Management/Obesity;Improve shortness of breath with ADL's;Diabetes;Hypertension;Lipids  Weight Management/Obesity;Heart Failure;Hypertension;Lipids;Other      Review  MaTrents taking meds as directed.  Her FBG has been around 100.   She has had low BG once overnight.  During the day she can tell if it gets low.  She has spoken to her Dr about her lack of appetite and concern that she has lost weight. They advised that it is ok at this point.  She has spoken with RD about way to get nutrients.  Makaela still is concerned about her heart health.  Staff encouraged her to communicate with Dr any new or changing symptoms.   Pachia had bloodwork last week and her Dr had her start magnesium supplements.  She sees her Dr next Monday.  She is taking all meds as directed (has missed a couple night doses due to helping sister)  She has been here at the hospital with her sister this week.  her Dr is following her "leaky heart valve".      Expected Outcomes  Short - find foods she likes to help maintain strength/follow up with Dr about appetite  Long - maintain healthy weight and heart healthy diet  Short - see Dr next week and follow Dr advice Long - manage health and heart concerns long term           Core Components/Risk Factors/Patient Goals at Discharge (Final Review):  Goals and Risk Factor Review - 01/28/19 1620      Core Components/Risk Factors/Patient Goals Review   Personal Goals Review  Weight Management/Obesity;Heart Failure;Hypertension;Lipids;Other    Review  Varetta had bloodwork last week and her Dr had her start magnesium supplements.  She sees her Dr next Monday.  She is taking all meds as directed (has missed a couple night doses due to helping sister)  She has been here at the hospital with her sister this week.  her Dr is following her "leaky heart valve".    Expected Outcomes  Short - see Dr next week and follow Dr advice Long - manage health and heart concerns long term         ITP Comments: ITP Comments    Row Name 10/11/18 1339 10/17/18 0611 11/01/18 1244 11/12/18 1557 11/14/18 0620   ITP Comments  Med Review completed. Initial ITP created. Diagnosis can be found in [facility] 10/14  30 day review. Continue with ITP unless  direccted changes per Medical Director Chart Review.  Patient called to inform us that her stress test went well and she will be back on 11/05/18.  Patient states she is having a procedure for her heart and will return to [facility] with a doctors note.  30 day review. Continue with ITP unless direccted changes per Medical Director Chart Review.  OUt for medical reason   Row Name 11/19/18 1746 12/11/18 0650 01/09/19 1404 02/05/19 1510 02/06/19 0622   ITP Comments  Post exercise blood glucose was 79 mg/dL. Patient was given 4 glucose tabs and blood glucose was 110 mg/dL. Patient was offered PB crackers but refused and said she was going home to eat dinner.   30 Day Review. Continue with ITP unless directed changes per Medical Director review.  Only one visit this month  30 day review completed. ITP sent to Dr. Emily Filbert, Medical Director of Cardiac Rehab. Continue with ITP unless changes are made by physician.  Office called to let us know that Nechuma is out sick this week and they requested for her to stay out of rehab as well.   30 day review. Continue with ITP unless directed changes by Medical Director chart review.      Comments: discharge ITP

## 2019-02-25 NOTE — Progress Notes (Signed)
Discharge Progress Report  Patient Details  Name: Kayla Malone MRN: [id number] Date of Birth: Aug 09, 1961 Referring Provider:     Cardiac Rehab from 10/11/2018 in [facility]  Referring Provider  Humphrey Rolls       Number of Visits: 18  Reason for Discharge:  Early Exit:  Lack of attendance  Smoking History:  Social History   Tobacco Use  Smoking Status Former Smoker  . Last attempt to quit: 09/13/1998  . Years since quitting: 20.4  Smokeless Tobacco Never Used    Diagnosis:  No diagnosis found.  ADL [facility]SD:   Initial Exercise Prescription: Initial Exercise Prescription - 10/11/18 1300      Date of Initial Exercise RX and Referring Provider   Date  10/11/18    Referring Provider  Humphrey Rolls      NuStep   Level  1    SPM  80    Minutes  15    METs  2.7      Biostep-RELP   Level  2    SPM  50    Minutes  15    METs  2      Track   Laps  25    Minutes  15    METs  2.15      Prescription Details   Frequency (times per week)  3    Duration  Progress to 45 minutes of aerobic exercise without signs/symptoms of physical distress      Intensity   THRR 40-80% of Max Heartrate  108-144    Ratings of Perceived Exertion  11-13    Perceived Dyspnea  0-4      Resistance Training   Training Prescription  Yes    Weight  3 lb    Reps  10-15       Discharge Exercise Prescription (Final Exercise Prescription Changes): Exercise Prescription Changes - 01/30/19 1100      Response to Exercise   Blood Pressure (Admit)  124/62    Blood Pressure (Exercise)  142/70    Heart Rate (Admit)  60 bpm    Heart Rate (Exercise)  78 bpm    Heart Rate (Exit)  53 bpm    Rating of Perceived Exertion (Exercise)  13    Duration  Progress to 45 minutes of aerobic exercise without signs/symptoms of physical distress    Intensity  Other (comment)      Progression   Progression  Continue to progress workloads to maintain intensity without signs/symptoms of physical  distress.    Average METs  3.15      Resistance Training   Training Prescription  Yes    Weight  3 lb    Reps  10-15      Interval Training   Interval Training  No      NuStep   Level  3    SPM  80    Minutes  30    METs  3.7       Functional Capacity: 6 Minute Walk    Row Name 10/11/18 1351         6 Minute Walk   Phase  Initial     Distance  700 feet     Walk Time  5.75 minutes     # of Rest Breaks  1     MPH  1.32     METS  2.72     RPE  12     Perceived Dyspnea  1     VO2 Peak  9.5     Symptoms  Yes (comment)     Comments  foot drag when she fatigues     Resting HR  72 bpm     Resting BP  148/82     Resting Oxygen Saturation   97 %     Exercise Oxygen Saturation  during 6 min walk  98 %     Max Ex. HR  95 bpm     Max Ex. BP  150/78     2 Minute Post BP  130/78        Psychological, QOL, Others - Outcomes: PHQ 2/9: Depression screen [facility] 2/9 10/11/2018 05/09/2018 12/28/2017  Decreased Interest '3 2 2  '$ Down, Depressed, Hopeless '3 2 2  '$ PHQ - 2 Score '6 4 4  '$ Altered sleeping 0 2 2  Tired, decreased energy '3 2 2  '$ Change in appetite '3 2 3  '$ Feeling bad or failure about yourself  2 3 0  Trouble concentrating '3 2 1  '$ Moving slowly or fidgety/restless 0 1 0  Suicidal thoughts 0 0 0  PHQ-9 Score '17 16 12  '$ Difficult doing work/chores Very difficult Somewhat difficult Not difficult at all    Quality of Life: Quality of Life - 10/11/18 1403      Quality of Life   Select  Quality of Life      Quality of Life Scores   Health/Function Pre  16.57 %    Socioeconomic Pre  23.5 %    Psych/Spiritual Pre  22.29 %    Family Pre  24.38 %    GLOBAL Pre  20.09 %       Personal Goals: Goals established at orientation with interventions provided to work toward goal. Personal Goals and Risk Factors at Admission - 10/11/18 1347      Core Components/Risk Factors/Patient Goals on Admission    Weight Management  Yes;Weight Maintenance    Intervention  Weight  Management: Develop a combined nutrition and exercise program designed to reach desired caloric intake, while maintaining appropriate intake of nutrient and fiber, sodium and fats, and appropriate energy expenditure required for the weight goal.;Weight Management: Provide education and appropriate resources to help participant work on and attain dietary goals.    Expected Outcomes  Short Term: Continue to assess and modify interventions until short term weight is achieved;Long Term: Adherence to nutrition and physical activity/exercise program aimed toward attainment of established weight goal;Weight Maintenance: Understanding of the daily nutrition guidelines, which includes 25-35% calories from fat, 7% or less cal from saturated fats, less than '200mg'$  cholesterol, less than 1.5gm of sodium, & 5 or more servings of fruits and vegetables daily;Understanding recommendations for meals to include 15-35% energy as protein, 25-35% energy from fat, 35-60% energy from carbohydrates, less than '200mg'$  of dietary cholesterol, 20-35 gm of total fiber daily;Understanding of distribution of calorie intake throughout the day with the consumption of 4-5 meals/snacks    Diabetes  Yes    Intervention  Provide education about signs/symptoms and action to take for hypo/hyperglycemia.;Provide education about proper nutrition, including hydration, and aerobic/resistive exercise prescription along with prescribed medications to achieve blood glucose in normal ranges: Fasting glucose 65-99 mg/dL    Expected Outcomes  Long Term: Attainment of HbA1C < 7%.;Short Term: Participant verbalizes understanding of the signs/symptoms and immediate care of hyper/hypoglycemia, proper foot care and importance of medication, aerobic/resistive exercise and nutrition plan for blood glucose control.    Hypertension  Yes  Intervention  Provide education on lifestyle modifcations including regular physical activity/exercise, weight management, moderate  sodium restriction and increased consumption of fresh fruit, vegetables, and low fat dairy, alcohol moderation, and smoking cessation.;Monitor prescription use compliance.    Expected Outcomes  Short Term: Continued assessment and intervention until BP is < 140/14m HG in hypertensive participants. < 130/822mHG in hypertensive participants with diabetes, heart failure or chronic kidney disease.;Long Term: Maintenance of blood pressure at goal levels.    Lipids  Yes    Intervention  Provide education and support for participant on nutrition & aerobic/resistive exercise along with prescribed medications to achieve LDL '70mg'$ , HDL >'40mg'$ .    Expected Outcomes  Short Term: Participant states understanding of desired cholesterol values and is compliant with medications prescribed. Participant is following exercise prescription and nutrition guidelines.;Long Term: Cholesterol controlled with medications as prescribed, with individualized exercise RX and with personalized nutrition plan. Value goals: LDL < '70mg'$ , HDL > 40 mg.        Personal Goals Discharge: Goals and Risk Factor Review    Row Name 12/24/18 1641 01/28/19 1620           Core Components/Risk Factors/Patient Goals Review   Personal Goals Review  Weight Management/Obesity;Improve shortness of breath with ADL's;Diabetes;Hypertension;Lipids  Weight Management/Obesity;Heart Failure;Hypertension;Lipids;Other      Review  MaLuceals taking meds as directed.  Her FBG has been around 100.  She has had low BG once overnight.  During the day she can tell if it gets low.  She has spoken to her Dr about her lack of appetite and concern that she has lost weight. They advised that it is ok at this point.  She has spoken with RD about way to get nutrients.  MaSherrylltill is concerned about her heart health.  Staff encouraged her to communicate with Dr any new or changing symptoms.   MaAnaisad bloodwork last week and her Dr had her start magnesium supplements.  She  sees her Dr next Monday.  She is taking all meds as directed (has missed a couple night doses due to helping sister)  She has been here at the hospital with her sister this week.  her Dr is following her "leaky heart valve".      Expected Outcomes  Short - find foods she likes to help maintain strength/follow up with Dr about appetite  Long - maintain healthy weight and heart healthy diet  Short - see Dr next week and follow Dr advice Long - manage health and heart concerns long term           Exercise Goals and Review: Exercise Goals    Row Name 10/11/18 1350             Exercise Goals   Increase Physical Activity  Yes       Intervention  Provide advice, education, support and counseling about physical activity/exercise needs.;Develop an individualized exercise prescription for aerobic and resistive training based on initial evaluation findings, risk stratification, comorbidities and participant's personal goals.       Expected Outcomes  Short Term: Attend rehab on a regular basis to increase amount of physical activity.;Long Term: Add in home exercise to make exercise part of routine and to increase amount of physical activity.;Long Term: Exercising regularly at least 3-5 days a week.       Increase Strength and Stamina  Yes       Intervention  Provide advice, education, support and counseling about physical activity/exercise needs.;Develop  an individualized exercise prescription for aerobic and resistive training based on initial evaluation findings, risk stratification, comorbidities and participant's personal goals.       Expected Outcomes  Short Term: Increase workloads from initial exercise prescription for resistance, speed, and METs.;Short Term: Perform resistance training exercises routinely during rehab and add in resistance training at home;Long Term: Improve cardiorespiratory fitness, muscular endurance and strength as measured by increased METs and functional capacity (6MWT)        Able to understand and use rate of perceived exertion (RPE) scale  Yes       Intervention  Provide education and explanation on how to use RPE scale       Expected Outcomes  Short Term: Able to use RPE daily in rehab to express subjective intensity level;Long Term:  Able to use RPE to guide intensity level when exercising independently       Knowledge and understanding of Target Heart Rate Range (THRR)  Yes       Intervention  Provide education and explanation of THRR including how the numbers were predicted and where they are located for reference       Expected Outcomes  Short Term: Able to state/look up THRR;Short Term: Able to use daily as guideline for intensity in rehab;Long Term: Able to use THRR to govern intensity when exercising independently       Able to check pulse independently  Yes       Intervention  Provide education and demonstration on how to check pulse in carotid and radial arteries.;Review the importance of being able to check your own pulse for safety during independent exercise       Expected Outcomes  Short Term: Able to explain why pulse checking is important during independent exercise;Long Term: Able to check pulse independently and accurately       Understanding of Exercise Prescription  Yes       Intervention  Provide education, explanation, and written materials on patient's individual exercise prescription       Expected Outcomes  Short Term: Able to explain program exercise prescription;Long Term: Able to explain home exercise prescription to exercise independently          Exercise Goals Re-Evaluation: Exercise Goals Re-Evaluation    Row Name 11/12/18 1504 11/21/18 1130 12/19/18 1349 12/24/18 1705 01/03/19 0815     Exercise Goal Re-Evaluation   Exercise Goals Review  Increase Physical Activity;Increase Strength and Stamina;Able to understand and use rate of perceived exertion (RPE) scale;Knowledge and understanding of Target Heart Rate Range (THRR)  Increase  Physical Activity;Increase Strength and Stamina;Able to understand and use rate of perceived exertion (RPE) scale;Knowledge and understanding of Target Heart Rate Range (THRR)  Increase Physical Activity;Increase Strength and Stamina;Able to understand and use rate of perceived exertion (RPE) scale  Increase Physical Activity;Able to understand and use rate of perceived exertion (RPE) scale;Knowledge and understanding of Target Heart Rate Range (THRR);Understanding of Exercise Prescription;Increase Strength and Stamina  Increase Physical Activity;Increase Strength and Stamina;Able to understand and use rate of perceived exertion (RPE) scale;Knowledge and understanding of Target Heart Rate Range (THRR)   Comments  Byrdie has only attended 4 sessions this month.  She would progress more with consistent attendance.    Ar[place]ina did well on the NS.  She still needs to attend consistently to see progress.  Shamila only attended HT once in December.  More consistent attendance is necessary for her to progress.  Reviewed home exercise with pt today.  Pt plans to  join [employer] for exercise.  Reviewed THR, pulse, RPE, sign and symptoms, NTG use, and when to call 911 or MD.  Also discussed weather considerations and indoor options.  Pt voiced understanding.  Mixtli tolerates exercise well when she attends.  More consistent attendance would yield better progress.   Expected Outcomes  Short - attend consistently 3 times per week Long - increase overall MET level  Short - attend three days per week Long - increase MET level  Short - attend HT 3 times per week Long - improve MET level and strength  Short - add one day per week exercise at home Long - continue exercise in [employer] - attend consistently Long - increase MET level   Row Name 01/15/19 1242 01/30/19 1141           Exercise Goal Re-Evaluation   Exercise Goals Review  Increase Physical Activity;Increase Strength and Stamina;Able to understand and use rate of  perceived exertion (RPE) scale;Knowledge and understanding of Target Heart Rate Range (THRR);Understanding of Exercise Prescription  Increase Physical Activity;Increase Strength and Stamina;Able to understand and use rate of perceived exertion (RPE) scale;Knowledge and understanding of Target Heart Rate Range (THRR);Able to check pulse independently      Comments  Kailynne sometimes arrives late and misses the strength portion of class which would help her leg strength.  Staff will encourage arriving on time  [facility] does well with NS and did 30 min instead of changing machines today.  Her MET level was 3.7.      Expected Outcomes  Short - be to class in time for strength training Long - Improve strength and stamina   Short - attend consistently and work on walking Long - increase MET level and walking time         Nutrition & Weight - Outcomes: Pre Biometrics - 10/11/18 1350      Pre Biometrics   Height  5' 4.25" (1.632 m)    Weight  146 lb 14.4 oz (66.6 kg)    Waist Circumference  32 inches    Hip Circumference  36.5 inches    Waist to Hip Ratio  0.88 %    BMI (Calculated)  25.02        Nutrition: Nutrition Therapy & Goals - 11/19/18 1641      Nutrition Therapy   Diet  DM    Drug/Food Interactions  Statins/Certain Fruits    Protein (specify units)  9-10oz    Fiber  20 grams    Whole Grain Foods  3 servings   chooses some whole grains IE oatmeal   Saturated Fats  12 max. grams    Fruits and Vegetables  5 servings/day   8 ideal; likes fruits and vegetables but has a poor appetite most days   Sodium  1500 grams      Personal Nutrition Goals   Nutrition Goal  Try adding marinades, different seasonings/ herbs/ spices to foods to improve taste profile. Foods that have aromas like soup may help stimulate appetite as well    Personal Goal #2  On days you do not feel like eating it may be helpful to make a smoothie as an alternative/ liquid nutritional source, espeically since you do not like  nutritional supplement drinks    Comments  She c/o severe decline in appetite after losing her sense of smell/taste s/p mini stroke. She has been losing wt, approx 10# since her last surgery date. Currently she eats 1-2x/day, with  some days being better than others in terms of appetite and "having a taste for things." She is a diabetic with BG readings typically 90-100 recently. Foods she likes and can tolerate: fruits, cheese, most vegetables, baked potatoes, salad, dry cereal, hard boiled eggs, oatmeal, soup. Does not usually eat breakfast. Drinks water and the occasional tea. She has tried nutritional drinks like Ensure and dislikes them.      Intervention Plan   Intervention  Prescribe, educate and counsel regarding individualized specific dietary modifications aiming towards targeted core components such as weight, hypertension, lipid management, diabetes, heart failure and other comorbidities.;Nutrition handout(s) given to patient.   Nutrition tips for taste changes handout; Smoothie recipes   Expected Outcomes  Short Term Goal: Understand basic principles of dietary content, such as calories, fat, sodium, cholesterol and nutrients.;Short Term Goal: A plan has been developed with personal nutrition goals set during dietitian appointment.;Long Term Goal: Adherence to prescribed nutrition plan.       Nutrition Discharge: Nutrition Assessments - 10/11/18 1409      MEDFICTS Scores   Pre Score  0   no appetite. barely eats anything through out the day.       Education Questionnaire Score: Knowledge Questionnaire Score - 10/11/18 1359      Knowledge Questionnaire Score   Pre Score  21/26   correct answers reviewed with [facility]. Focus on nutrition and exercise.       Goals reviewed with patient; copy given to patient.

## 2019-05-29 ENCOUNTER — Other Ambulatory Visit: Payer: Self-pay

## 2019-05-29 ENCOUNTER — Emergency Department: Payer: Medicare Other

## 2019-05-29 ENCOUNTER — Inpatient Hospital Stay
Admission: EM | Admit: 2019-05-29 | Discharge: 2019-05-31 | DRG: 641 | Disposition: A | Discharge status: Home / Self Care | Payer: Medicare Other | Attending: Internal Medicine | Admitting: Internal Medicine

## 2019-05-29 DIAGNOSIS — Z885 Allergy status to narcotic agent status: Secondary | ICD-10-CM

## 2019-05-29 DIAGNOSIS — Z841 Family history of disorders of kidney and ureter: Secondary | ICD-10-CM

## 2019-05-29 DIAGNOSIS — Z7951 Long term (current) use of inhaled steroids: Secondary | ICD-10-CM | POA: Diagnosis not present

## 2019-05-29 DIAGNOSIS — I10 Essential (primary) hypertension: Secondary | ICD-10-CM | POA: Diagnosis not present

## 2019-05-29 DIAGNOSIS — Z1159 Encounter for screening for other viral diseases: Secondary | ICD-10-CM

## 2019-05-29 DIAGNOSIS — F329 Major depressive disorder, single episode, unspecified: Secondary | ICD-10-CM | POA: Diagnosis not present

## 2019-05-29 DIAGNOSIS — K449 Diaphragmatic hernia without obstruction or gangrene: Secondary | ICD-10-CM | POA: Diagnosis not present

## 2019-05-29 DIAGNOSIS — Z90710 Acquired absence of both cervix and uterus: Secondary | ICD-10-CM | POA: Diagnosis not present

## 2019-05-29 DIAGNOSIS — W19XXXA Unspecified fall, initial encounter: Secondary | ICD-10-CM | POA: Diagnosis not present

## 2019-05-29 DIAGNOSIS — R634 Abnormal weight loss: Secondary | ICD-10-CM | POA: Diagnosis present

## 2019-05-29 DIAGNOSIS — I252 Old myocardial infarction: Secondary | ICD-10-CM | POA: Diagnosis not present

## 2019-05-29 DIAGNOSIS — Z7901 Long term (current) use of anticoagulants: Secondary | ICD-10-CM | POA: Diagnosis not present

## 2019-05-29 DIAGNOSIS — E119 Type 2 diabetes mellitus without complications: Secondary | ICD-10-CM | POA: Diagnosis not present

## 2019-05-29 DIAGNOSIS — E876 Hypokalemia: Secondary | ICD-10-CM

## 2019-05-29 DIAGNOSIS — Z951 Presence of aortocoronary bypass graft: Secondary | ICD-10-CM | POA: Diagnosis not present

## 2019-05-29 DIAGNOSIS — Z833 Family history of diabetes mellitus: Secondary | ICD-10-CM

## 2019-05-29 DIAGNOSIS — Z87891 Personal history of nicotine dependence: Secondary | ICD-10-CM

## 2019-05-29 DIAGNOSIS — Z955 Presence of coronary angioplasty implant and graft: Secondary | ICD-10-CM

## 2019-05-29 DIAGNOSIS — E785 Hyperlipidemia, unspecified: Secondary | ICD-10-CM | POA: Diagnosis present

## 2019-05-29 DIAGNOSIS — I69354 Hemiplegia and hemiparesis following cerebral infarction affecting left non-dominant side: Secondary | ICD-10-CM | POA: Diagnosis not present

## 2019-05-29 DIAGNOSIS — Z825 Family history of asthma and other chronic lower respiratory diseases: Secondary | ICD-10-CM

## 2019-05-29 DIAGNOSIS — R1115 Cyclical vomiting syndrome unrelated to migraine: Secondary | ICD-10-CM | POA: Diagnosis not present

## 2019-05-29 DIAGNOSIS — K219 Gastro-esophageal reflux disease without esophagitis: Secondary | ICD-10-CM | POA: Diagnosis not present

## 2019-05-29 DIAGNOSIS — R531 Weakness: Secondary | ICD-10-CM | POA: Diagnosis present

## 2019-05-29 DIAGNOSIS — I2510 Atherosclerotic heart disease of native coronary artery without angina pectoris: Secondary | ICD-10-CM | POA: Diagnosis present

## 2019-05-29 DIAGNOSIS — Z79899 Other long term (current) drug therapy: Secondary | ICD-10-CM

## 2019-05-29 DIAGNOSIS — Z888 Allergy status to other drugs, medicaments and biological substances status: Secondary | ICD-10-CM

## 2019-05-29 DIAGNOSIS — Z7984 Long term (current) use of oral hypoglycemic drugs: Secondary | ICD-10-CM

## 2019-05-29 DIAGNOSIS — R55 Syncope and collapse: Secondary | ICD-10-CM | POA: Diagnosis not present

## 2019-05-29 DIAGNOSIS — Z7982 Long term (current) use of aspirin: Secondary | ICD-10-CM | POA: Diagnosis not present

## 2019-05-29 DIAGNOSIS — Z880 Allergy status to penicillin: Secondary | ICD-10-CM

## 2019-05-29 DIAGNOSIS — J45909 Unspecified asthma, uncomplicated: Secondary | ICD-10-CM | POA: Diagnosis not present

## 2019-05-29 DIAGNOSIS — Z8249 Family history of ischemic heart disease and other diseases of the circulatory system: Secondary | ICD-10-CM

## 2019-05-29 DIAGNOSIS — Z823 Family history of stroke: Secondary | ICD-10-CM

## 2019-05-29 HISTORY — DX: Hypokalemia: E87.6

## 2019-05-29 LAB — COMPREHENSIVE METABOLIC PANEL
ALT: 11 U/L (ref 0–44)
AST: 16 U/L (ref 15–41)
Albumin: 3.4 g/dL — ABNORMAL LOW (ref 3.5–5.0)
Alkaline Phosphatase: 48 U/L (ref 38–126)
Anion gap: 10 (ref 5–15)
BUN: 9 mg/dL (ref 6–20)
CO2: 23 mmol/L (ref 22–32)
Calcium: 7.3 mg/dL — ABNORMAL LOW (ref 8.9–10.3)
Chloride: 107 mmol/L (ref 98–111)
Creatinine, Ser: 0.77 mg/dL (ref 0.44–1.00)
GFR calc Af Amer: >60 mL/min (ref >60)
GFR calc non Af Amer: >60 mL/min (ref >60)
Glucose, Bld: 113 mg/dL — ABNORMAL HIGH (ref 70–99)
Potassium: 2.1 mmol/L — CL (ref 3.5–5.1)
Sodium: 140 mmol/L (ref 135–145)
Total Bilirubin: 0.8 mg/dL (ref 0.3–1.2)
Total Protein: 6 g/dL — ABNORMAL LOW (ref 6.5–8.1)

## 2019-05-29 LAB — CBC WITH DIFFERENTIAL/PLATELET
Abs Immature Granulocytes: 0.02 10*3/uL (ref 0.00–0.07)
Basophils Absolute: 0 10*3/uL (ref 0.0–0.1)
Basophils Relative: 1 %
Eosinophils Absolute: 0 10*3/uL (ref 0.0–0.5)
Eosinophils Relative: 0 %
HCT: 39.7 % (ref 36.0–46.0)
Hemoglobin: 13.3 g/dL (ref 12.0–15.0)
Immature Granulocytes: 0 %
Lymphocytes Relative: 24 %
Lymphs Abs: 1.4 10*3/uL (ref 0.7–4.0)
MCH: 27.1 pg (ref 26.0–34.0)
MCHC: 33.5 g/dL (ref 30.0–36.0)
MCV: 81 fL (ref 80.0–100.0)
Monocytes Absolute: 0.4 10*3/uL (ref 0.1–1.0)
Monocytes Relative: 7 %
Neutro Abs: 4.2 10*3/uL (ref 1.7–7.7)
Neutrophils Relative %: 68 %
Platelets: 317 10*3/uL (ref 150–400)
RBC: 4.9 MIL/uL (ref 3.87–5.11)
RDW: 13.6 % (ref 11.5–15.5)
WBC: 6.1 10*3/uL (ref 4.0–10.5)
nRBC: 0 % (ref 0.0–0.2)

## 2019-05-29 LAB — URINALYSIS, COMPLETE (UACMP) WITH MICROSCOPIC
Bacteria, UA: NONE SEEN
Bilirubin Urine: NEGATIVE
Glucose, UA: NEGATIVE mg/dL
Ketones, ur: NEGATIVE mg/dL
Nitrite: NEGATIVE
Protein, ur: NEGATIVE mg/dL
Specific Gravity, Urine: 1.009 (ref 1.005–1.030)
pH: 8 (ref 5.0–8.0)

## 2019-05-29 LAB — HEMOGLOBIN A1C
Hgb A1c MFr Bld: 6.8 % — ABNORMAL HIGH (ref 4.8–5.6)
Mean Plasma Glucose: 148.46 mg/dL

## 2019-05-29 LAB — SARS CORONAVIRUS 2 BY RT PCR (HOSPITAL ORDER, PERFORMED IN ~~LOC~~ HOSPITAL LAB): SARS Coronavirus 2: NEGATIVE

## 2019-05-29 LAB — GLUCOSE, CAPILLARY: Glucose-Capillary: 94 mg/dL (ref 70–99)

## 2019-05-29 LAB — POTASSIUM: Potassium: 3.3 mmol/L — ABNORMAL LOW (ref 3.5–5.1)

## 2019-05-29 LAB — LIPASE, BLOOD: Lipase: 34 U/L (ref 11–51)

## 2019-05-29 LAB — TROPONIN I: Troponin I: 0.03 ng/mL (ref <0.03)

## 2019-05-29 LAB — MAGNESIUM: Magnesium: 1.4 mg/dL — ABNORMAL LOW (ref 1.7–2.4)

## 2019-05-29 MED ORDER — SODIUM CHLORIDE 0.9% FLUSH: 3.0000 mL | INTRAVENOUS | Status: DC | PRN

## 2019-05-29 MED ORDER — ACETAMINOPHEN 650 MG RE SUPP: 650.0000 mg | Freq: Four times a day (QID) | RECTAL | Status: DC | PRN

## 2019-05-29 MED ORDER — POTASSIUM CHLORIDE IN NACL 40-0.9 MEQ/L-% IV SOLN
INTRAVENOUS | Status: DC
  Administered 2019-05-29 – 2019-05-31 (×4): 100 mL/h via INTRAVENOUS
  Filled 2019-05-29 (×8): qty 1000

## 2019-05-29 MED ORDER — SODIUM CHLORIDE 0.9 % IV BOLUS
500.0000 mL | Freq: Once | INTRAVENOUS | Status: AC
  Administered 2019-05-29: 500 mL via INTRAVENOUS

## 2019-05-29 MED ORDER — PANTOPRAZOLE SODIUM 40 MG PO TBEC
40.0000 mg | DELAYED_RELEASE_TABLET | Freq: Every day | ORAL | Status: DC
  Administered 2019-05-30 – 2019-05-31 (×2): 40 mg via ORAL
  Filled 2019-05-29 (×2): qty 1

## 2019-05-29 MED ORDER — FLUTICASONE PROPIONATE 50 MCG/ACT NA SUSP
2.0000 | Freq: Every day | NASAL | Status: DC
  Administered 2019-05-31: 2 via NASAL
  Filled 2019-05-29: qty 16

## 2019-05-29 MED ORDER — POTASSIUM CHLORIDE CRYS ER 20 MEQ PO TBCR
40.0000 meq | EXTENDED_RELEASE_TABLET | Freq: Once | ORAL | Status: AC
  Administered 2019-05-29: 40 meq via ORAL
  Filled 2019-05-29: qty 2

## 2019-05-29 MED ORDER — ALBUTEROL SULFATE (2.5 MG/3ML) 0.083% IN NEBU: 2.5000 mg | INHALATION_SOLUTION | RESPIRATORY_TRACT | Status: DC | PRN

## 2019-05-29 MED ORDER — TRAZODONE HCL 50 MG PO TABS
100.0000 mg | ORAL_TABLET | Freq: Every day | ORAL | Status: DC
  Administered 2019-05-29 – 2019-05-30 (×2): 100 mg via ORAL
  Filled 2019-05-29 (×2): qty 2

## 2019-05-29 MED ORDER — TRAZODONE HCL 50 MG PO TABS: 50.0000 mg | ORAL_TABLET | Freq: Every day | ORAL | Status: DC

## 2019-05-29 MED ORDER — ACETAMINOPHEN 325 MG PO TABS
650.0000 mg | ORAL_TABLET | Freq: Four times a day (QID) | ORAL | Status: DC | PRN
  Administered 2019-05-30: 650 mg via ORAL
  Filled 2019-05-29: qty 2

## 2019-05-29 MED ORDER — INSULIN ASPART 100 UNIT/ML ~~LOC~~ SOLN
0.0000 [IU] | Freq: Three times a day (TID) | SUBCUTANEOUS | Status: DC
  Administered 2019-05-31: 1 [IU] via SUBCUTANEOUS
  Filled 2019-05-29: qty 1

## 2019-05-29 MED ORDER — SODIUM CHLORIDE 0.9 % IV SOLN
250.0000 mL | INTRAVENOUS | Status: DC | PRN
  Administered 2019-05-30: 12:00:00 via INTRAVENOUS

## 2019-05-29 MED ORDER — INSULIN ASPART 100 UNIT/ML ~~LOC~~ SOLN: 0.0000 [IU] | Freq: Every day | SUBCUTANEOUS | Status: DC

## 2019-05-29 MED ORDER — MAGNESIUM SULFATE 2 GM/50ML IV SOLN
2.0000 g | Freq: Once | INTRAVENOUS | Status: AC
  Administered 2019-05-29: 2 g via INTRAVENOUS
  Filled 2019-05-29: qty 50

## 2019-05-29 MED ORDER — ATORVASTATIN CALCIUM 20 MG PO TABS
80.0000 mg | ORAL_TABLET | Freq: Every day | ORAL | Status: DC
  Administered 2019-05-30 – 2019-05-31 (×2): 80 mg via ORAL
  Filled 2019-05-29 (×2): qty 4

## 2019-05-29 MED ORDER — METOPROLOL SUCCINATE ER 50 MG PO TB24
50.0000 mg | ORAL_TABLET | Freq: Every day | ORAL | Status: DC
  Administered 2019-05-30: 50 mg via ORAL
  Filled 2019-05-29 (×2): qty 1

## 2019-05-29 MED ORDER — TICAGRELOR 90 MG PO TABS
90.0000 mg | ORAL_TABLET | Freq: Two times a day (BID) | ORAL | Status: DC
  Administered 2019-05-29 – 2019-05-31 (×3): 90 mg via ORAL
  Filled 2019-05-29 (×6): qty 1

## 2019-05-29 MED ORDER — ONDANSETRON HCL 4 MG/2ML IJ SOLN
4.0000 mg | Freq: Four times a day (QID) | INTRAMUSCULAR | Status: DC | PRN
  Administered 2019-05-29 – 2019-05-30 (×2): 4 mg via INTRAVENOUS
  Filled 2019-05-29 (×2): qty 2

## 2019-05-29 MED ORDER — GABAPENTIN 100 MG PO CAPS
100.0000 mg | ORAL_CAPSULE | Freq: Every day | ORAL | Status: DC
  Administered 2019-05-30 – 2019-05-31 (×2): 100 mg via ORAL
  Filled 2019-05-29 (×2): qty 1

## 2019-05-29 MED ORDER — CITALOPRAM HYDROBROMIDE 20 MG PO TABS: 20.0000 mg | ORAL_TABLET | Freq: Every day | ORAL | Status: DC

## 2019-05-29 MED ORDER — NITROGLYCERIN 0.4 MG SL SUBL: 0.4000 mg | SUBLINGUAL_TABLET | SUBLINGUAL | Status: DC | PRN

## 2019-05-29 MED ORDER — SODIUM CHLORIDE 0.9% FLUSH
3.0000 mL | Freq: Two times a day (BID) | INTRAVENOUS | Status: DC
  Administered 2019-05-29 – 2019-05-31 (×2): 3 mL via INTRAVENOUS

## 2019-05-29 MED ORDER — LOSARTAN POTASSIUM 50 MG PO TABS
50.0000 mg | ORAL_TABLET | Freq: Every day | ORAL | Status: DC
  Administered 2019-05-30 – 2019-05-31 (×2): 50 mg via ORAL
  Filled 2019-05-29 (×2): qty 1

## 2019-05-29 MED ORDER — MOMETASONE FURO-FORMOTEROL FUM 200-5 MCG/ACT IN AERO
2.0000 | INHALATION_SPRAY | Freq: Two times a day (BID) | RESPIRATORY_TRACT | Status: DC
  Administered 2019-05-29 – 2019-05-31 (×4): 2 via RESPIRATORY_TRACT
  Filled 2019-05-29: qty 8.8

## 2019-05-29 MED ORDER — ONDANSETRON HCL 4 MG PO TABS: 4.0000 mg | ORAL_TABLET | Freq: Four times a day (QID) | ORAL | Status: DC | PRN

## 2019-05-29 MED ORDER — ENOXAPARIN SODIUM 40 MG/0.4ML ~~LOC~~ SOLN
40.0000 mg | SUBCUTANEOUS | Status: DC
  Administered 2019-05-30: 40 mg via SUBCUTANEOUS
  Filled 2019-05-29 (×2): qty 0.4

## 2019-05-29 MED ORDER — CITALOPRAM HYDROBROMIDE 20 MG PO TABS
20.0000 mg | ORAL_TABLET | Freq: Every day | ORAL | Status: DC
  Administered 2019-05-29 – 2019-05-30 (×2): 20 mg via ORAL
  Filled 2019-05-29 (×2): qty 1

## 2019-05-29 MED ORDER — ASPIRIN EC 81 MG PO TBEC
81.0000 mg | DELAYED_RELEASE_TABLET | Freq: Every day | ORAL | Status: DC
  Administered 2019-05-30 – 2019-05-31 (×2): 81 mg via ORAL
  Filled 2019-05-29 (×2): qty 1

## 2019-05-29 MED ORDER — SODIUM CHLORIDE 0.9 % IV SOLN
INTRAVENOUS | Status: DC
  Administered 2019-05-30: 12:00:00 via INTRAVENOUS

## 2019-05-29 MED ORDER — ISOSORBIDE MONONITRATE ER 30 MG PO TB24
30.0000 mg | ORAL_TABLET | Freq: Every day | ORAL | Status: DC
  Administered 2019-05-30 – 2019-05-31 (×2): 30 mg via ORAL
  Filled 2019-05-29 (×2): qty 1

## 2019-05-29 NOTE — Progress Notes (Signed)
Advanced Care Plan.  Purpose of Encounter: CODE STATUS. Parties in Attendance: The patient and me. Patient's Decisional Capacity: Yes. Medical Story: Kayla Malone  is a 58 y.o. female with a known history of asthma, CAD, hypertension, diabetes, GERD, stroke and thyroid cyst.    The patient is being admitted for severe hypokalemia, hypomagnesemia, nausea and vomiting with poor oral intake and weight loss.  I discussed with patient about her current condition, prognosis and CODE STATUS.  The patient does want to be resuscitated and intubated if she has cardiopulmonary arrest. Plan:  Code Status: Full code. Time spent discussing advance care planning: 17 minutes.

## 2019-05-29 NOTE — ED Notes (Signed)
Pt assisted to bathroom to attempt urine sample.

## 2019-05-29 NOTE — H&P (View-Only) (Signed)
GI Inpatient Consult Note  Reason for Consult: Nausea, vomiting, unintentional weight loss   Attending Requesting Consult: Dr. Demetrios Loll  History of Present Illness: Kayla Malone is a 58 y.o. female seen for evaluation of persistent nausea and vomiting and unintentional weight loss at the request of Dr. Bridgett Larsson. Patient was being seen at her PCP this morning and reports she had a syncopal episode while she was attempting to get on the scale and hit her head. She was recently started on a new anti-hypertensive medication on Monday and also was told to increase her isosorbide which she started yesterday. However, more concerning to the patient is the fact she has had persistent postprandial vomiting with nausea and decreased PO intake over the past 18 months. She reports this has worsened over the past two months where she is vomiting appx 10 minutes after eating or drinking any type of food. She reports it is not worse with any specific foods and she reports she doesn't eat a lot of fatty or greasy foods. There is no component of esophageal dysphagia or odynophagia. She takes Protonix 40 mg and this works well to prevent any symptoms of retrosternal burning or acid regurgitation. Reflux symptoms have not been worse over this time frame. She does report a history of DM and her last A1C was 6.8%. She reports a previous EGD >5 years ago at [facility] which showed a hiatal hernia. There is no family hx of esophageal or gastric malignancies. She has been given Zofran for nausea which seems to work some days but some days her nausea is so bad she doesn't eat anything. Over the past two months, she reports she has lost 12 lbs, 20 lbs over the past year. Pt is very concerned about her symptoms and says "I just know something is wrong." She would like any procedures or imaging here that could help her find some answers. She was found to have potassium 2.1 in the ED.    Patient recently had drug-eluting stent placed  09/2018 by Dr. Irish Lack where it was recommended she have dual antiplatelet therapy with aspirin and Brilinta uninterrupted for 12 months.   Last Colonoscopy: Unknown  Last Endoscopy: >5 years ago    Past Medical History:  Past Medical History:  Diagnosis Date  . Asthma   . Coronary artery disease   . Depression   . Diabetes mellitus without complication ([place])   . GERD (gastroesophageal reflux disease)   . History of hiatal hernia   . Hypertension   . Pain    BACK  . Stroke ([place])    x3  . Thyroid cyst     Problem List: Patient Active Problem List   Diagnosis Date Noted  . Hypokalemia 05/29/2019  . NSTEMI (non-ST elevated myocardial infarction) ([place]) 09/23/2018  . Hx of CABG 10/30/2017  . Coronary artery disease 10/23/2017  . Unstable angina ([place]) 10/20/2017  . Cerebral artery occlusion with cerebral infarction ([place]) 08/06/2017  . Chest pain, unspecified 08/06/2017  . Diabetes ([place]) 08/06/2017  . Hyperlipidemia, unspecified 08/06/2017  . HTN (hypertension) 08/06/2017  . Lumbar radiculopathy 02/15/2017  . Adenomatous colon polyp 02/20/2014  . Benign neoplasm of colon, unspecified 02/20/2014  . Asthma 09/13/2013  . GERD (gastroesophageal reflux disease) 09/13/2013  . CVA, old, hemiparesis ([place]) 09/13/2013    Past Surgical History: Past Surgical History:  Procedure Laterality Date  . ABDOMINAL HYSTERECTOMY    . CARDIAC SURGERY    . CATARACT EXTRACTION W/PHACO Left 08/15/2017   Procedure:  CATARACT EXTRACTION PHACO AND INTRAOCULAR LENS PLACEMENT (IOC);  Surgeon: Birder Robson, MD;  Location: [facility];  Service: Ophthalmology;  Laterality: Left;  [place] 00:40.6 AP% 7.3 CDE 2.98 FLUID PACK LOT # [id number]  . CORONARY ARTERY BYPASS GRAFT N/A 10/23/2017   Procedure: CORONARY ARTERY BYPASS GRAFTING (CABG) x two  , using left internal mammary artery and right leg greater saphenous vein harvested  endoscopically - LIMA to LAD, SVG to RCA;  Surgeon: Grace Isaac, MD;   Location: [place];  Service: Open Heart Surgery;  Laterality: N/A;  . CORONARY STENT INTERVENTION N/A 09/24/2018   Procedure: CORONARY STENT INTERVENTION;  Surgeon: Jettie Booze, MD;  Location: [place] [facility];  Service: Cardiovascular;  Laterality: N/A;  . EYE SURGERY    . LEFT HEART CATH AND CORONARY ANGIOGRAPHY Left 10/09/2017   Procedure: LEFT HEART CATH AND CORONARY ANGIOGRAPHY;  Surgeon: Dionisio [name], MD;  Location: [place] [facility];  Service: Cardiovascular;  Laterality: Left;  . LEFT HEART CATH AND CORS/GRAFTS ANGIOGRAPHY N/A 09/24/2018   Procedure: LEFT HEART CATH AND CORS/GRAFTS ANGIOGRAPHY;  Surgeon: Jettie Booze, MD;  Location: [place] [facility];  Service: Cardiovascular;  Laterality: N/A;  . TEE WITHOUT CARDIOVERSION N/A 10/23/2017   Procedure: TRANSESOPHAGEAL ECHOCARDIOGRAM (TEE);  Surgeon: Grace Isaac, MD;  Location: [place];  Service: Open Heart Surgery;  Laterality: N/A;    Allergies: Allergies  Allergen Reactions  . Lisinopril Swelling  . Tramadol Other (See Comments)    sleepiness  . Amoxicillin Rash    Don't remember   . Clavulanic Acid Rash    Home Medications: Medications Prior to Admission  Medication Sig Dispense Refill Last Dose  . aspirin EC 81 MG tablet Take 81 mg by mouth daily.   Past Week at Unknown time  . atorvastatin (LIPITOR) 40 MG tablet Take 80 mg by mouth daily.    Past Week at Unknown time  . citalopram (CELEXA) 20 MG tablet Take 20 mg by mouth daily.   Past Week at Unknown time  . fluticasone (FLONASE) 50 MCG/ACT nasal spray Place 2 sprays into both nostrils daily.   Past Week at Unknown time  . Fluticasone-Salmeterol (ADVAIR DISKUS) 500-50 MCG/DOSE AEPB Inhale 1 puff into the lungs daily as needed (SOB).    Past Week at Unknown time  . gabapentin (NEURONTIN) 100 MG capsule Take 100 mg by mouth daily.   Past Week at Unknown time  . isosorbide mononitrate (IMDUR) 30 MG 24 hr tablet Take 30 mg by mouth daily.   Past Week  at Unknown time  . losartan (COZAAR) 50 MG tablet Take 50 mg by mouth daily.  2 Past Week at Unknown time  . metFORMIN (GLUCOPHAGE) 500 MG tablet Take 500-1,000 mg by mouth 2 (two) times daily with a meal. Take 1000 mg (2 tablets) by mouth with breakfast and 500 mg (1 tablet) in the evening with supper  5 Past Week at Unknown time  . metoprolol succinate (TOPROL-XL) 50 MG 24 hr tablet Take 50 mg by mouth daily. Take with or immediately following a meal.   Past Week at Unknown time  . pantoprazole (PROTONIX) 40 MG tablet Take 40 mg by mouth.    Past Week at Unknown time  . sitaGLIPtin (JANUVIA) 50 MG tablet Take 50 mg by mouth daily.   Past Week at Unknown time  . ticagrelor (BRILINTA) 90 MG TABS tablet Take 1 tablet (90 mg total) by mouth 2 (two) times daily. 30 tablet  0 Past Week at 2000  . traZODone (DESYREL) 50 MG tablet Take 50 mg by mouth at bedtime.  5 Past Week at Unknown time  . VOLTAREN 1 % GEL Apply 2 g topically as needed (pain in shoulder).   2 Past Week at Unknown time  . acetaminophen (TYLENOL) 500 MG tablet Take 1,000 mg by mouth every 6 (six) hours as needed for mild pain or headache.   prn at prn  . albuterol (ACCUNEB) 0.63 MG/3ML nebulizer solution Take 1 ampule by nebulization every 6 (six) hours as needed for wheezing.   prn at prn  . albuterol (PROVENTIL HFA;VENTOLIN HFA) 108 (90 Base) MCG/ACT inhaler Inhale 2 puffs into the lungs every 6 (six) hours as needed for wheezing or shortness of breath.    prn at prn  . nitroGLYCERIN (NITROSTAT) 0.4 MG SL tablet Place 1 tablet (0.4 mg total) under the tongue every 5 (five) minutes x 3 doses as needed for chest pain. 25 tablet 0 prn at prn   Home medication reconciliation was completed with the patient.   Scheduled Inpatient Medications:   . enoxaparin (LOVENOX) injection  40 mg Subcutaneous Q24H  . sodium chloride flush  3 mL Intravenous Q12H    Continuous Inpatient Infusions:   . sodium chloride    . 0.9 % NaCl with KCl 40 mEq  / L      PRN Inpatient Medications:  sodium chloride, acetaminophen **OR** acetaminophen, albuterol, ondansetron **OR** ondansetron (ZOFRAN) IV, sodium chloride flush  Family History: family history includes COPD in her brother; Cirrhosis in her father; Coronary artery disease in her brother, father, and sister; Diabetes type II in her father; Heart failure in her mother; Hypertension in her father; Kidney disease in her father; Pulmonary embolism in her mother; Stroke in her sister.  The patient's family history is negative for inflammatory bowel disorders, GI malignancy, or solid organ transplantation.  Social History:   reports that she quit smoking about 20 years ago. She has never used smokeless tobacco. She reports that she does not drink alcohol or use drugs. The patient denies ETOH, tobacco, or drug use.   Review of Systems: Constitutional: Weight is stable.  Eyes: No changes in vision. ENT: No oral lesions, sore throat.  GI: see HPI.  Heme/Lymph: No easy bruising.  CV: No chest pain.  GU: No hematuria.  Integumentary: No rashes.  Neuro: No headaches.  Psych: No depression/anxiety.  Endocrine: No heat/cold intolerance.  Allergic/Immunologic: No urticaria.  Resp: No cough, SOB.  Musculoskeletal: No joint swelling.    Physical Examination: BP 136/81   Pulse 65   Temp 98.6 F (37 C) (Oral)   Resp 17   Ht 5\' 8"  (1.727 m)   Wt 63.5 kg   SpO2 99%   BMI 21.29 kg/m  Gen: NAD, alert and oriented x 4 HEENT: PEERLA, EOMI, Neck: supple, no JVD or thyromegaly Chest: CTA bilaterally, no wheezes, crackles, or other adventitious sounds CV: RRR, no m/g/c/r Abd: soft, NT, ND, +BS in all four quadrants; no HSM, guarding, ridigity, or rebound tenderness Ext: no edema, well perfused with 2+ pulses, Skin: no rash or lesions noted Lymph: no LAD  Data: Lab Results  Component Value Date   WBC 6.1 05/29/2019   HGB 13.3 05/29/2019   HCT 39.7 05/29/2019   MCV 81.0 05/29/2019   PLT  317 05/29/2019   Recent Labs  Lab 05/29/19 1046  HGB 13.3   Lab Results  Component Value Date   NA 140  05/29/2019   K 2.1 (LL) 05/29/2019   CL 107 05/29/2019   CO2 23 05/29/2019   BUN 9 05/29/2019   CREATININE 0.77 05/29/2019   Lab Results  Component Value Date   ALT 11 05/29/2019   AST 16 05/29/2019   ALKPHOS 48 05/29/2019   BILITOT 0.8 05/29/2019   No results for input(s): APTT, INR, PTT in the last 168 hours. Assessment/Plan:  58 y/o AA female with a PMH of CVA, DM, HLD, HTN, CAD s/p CABG 2018, NSTEMI 09/2018 with stent placement 09/2018 admitted for syncopal episode and generalized weakness  1. Post-prandial vomiting with nausea  2. Poor oral intake 3. Unintentional weight loss 4. Severe hypokalemia - she is receiving PO and IV replacement, pharmacy is following  - Postprandial vomiting with nausea progressively worsening over the past 2 months and ongoing over the past 18 months. Differential includes gastric outlet obstruction, peptic ulcer disease, gastritis, gastroparesis 2/2 uncontrolled DM, achalasia, IBS, functional, medication side effect, malignancy - Advise EGD with luminal evaluation. We will perform diagnostic EGD while patient is on aspirin and Brilinta. We understand that she is at a higher risk of bleeding but this is in the best interest in the patient.  - I reviewed the risks (including bleeding, perforation, infection, anesthesia complications, cardiac/respiratory complications), benefits and alternatives of EGD. Patient consents to proceed.  - NPO after midnight - Continue Protoinx - Further recs after procedure - Plan for EGD tomorrow with Dr. Alice Reichert     Thank you for the consult. Please call with questions or concerns.  Reeves Forth [facility] [phone] (Cell)

## 2019-05-29 NOTE — ED Notes (Signed)
Patient transported to CT 

## 2019-05-29 NOTE — ED Notes (Signed)
Admitting MD at bedside.

## 2019-05-29 NOTE — Consult Note (Signed)
PHARMACY CONSULT NOTE - FOLLOW UP  Pharmacy Consult for Electrolyte Monitoring and Replacement   Recent Labs: Potassium (mmol/L)  Date Value  05/29/2019 3.3 (L)  10/17/2012 3.6   Magnesium (mg/dL)  Date Value  05/29/2019 1.4 (L)   Calcium (mg/dL)  Date Value  05/29/2019 7.3 (L)   Calcium, Total (mg/dL)  Date Value  06/10/2012 8.8   Albumin (g/dL)  Date Value  05/29/2019 3.4 (L)  06/10/2012 3.8   Sodium (mmol/L)  Date Value  05/29/2019 140  06/10/2012 140   Assessment: 58 y.o. female with a known history of asthma, CAD, hypertension, diabetes, GERD, stroke and thyroid cyst.  The patient presents the ED with nausea, vomiting, poor oral intake and weight loss for 18 months.  Pt presented with a potassium lab value of 2.1.  Pt was given KCl 10meq po x 1 and started on NS with 12meq KCl @ 157ml/hr.  At a recheck @ 1835, potassium level had improved to 3.3.  Pt presented with a magnesium value of 1.4.  Patient received 2g Mg Sulfate IV x 1.  Goal of Therapy:  Electrolytes wnl's  Plan:  Pt will receive an additional 42meq of KCl with current IV fluids over the next 10 hours.  Will not give additional replenishment at this time.    Will recheck K, Mg, and Phos with am labs and replenish if warranted per pharmacy consult.  Lu Duffel, PharmD, BCPS Clinical Pharmacist 05/29/2019 7:17 PM

## 2019-05-29 NOTE — ED Notes (Signed)
Pt unable to obtain urine sample at this time 

## 2019-05-29 NOTE — Consult Note (Signed)
GI Inpatient Consult Note  Reason for Consult: Nausea, vomiting, unintentional weight loss   Attending Requesting Consult: Dr. Demetrios Loll  History of Present Illness: Kayla Malone is a 58 y.o. female seen for evaluation of persistent nausea and vomiting and unintentional weight loss at the request of Dr. Bridgett Larsson. Patient was being seen at her PCP this morning and reports she had a syncopal episode while she was attempting to get on the scale and hit her head. She was recently started on a new anti-hypertensive medication on Monday and also was told to increase her isosorbide which she started yesterday. However, more concerning to the patient is the fact she has had persistent postprandial vomiting with nausea and decreased PO intake over the past 18 months. She reports this has worsened over the past two months where she is vomiting appx 10 minutes after eating or drinking any type of food. She reports it is not worse with any specific foods and she reports she doesn't eat a lot of fatty or greasy foods. There is no component of esophageal dysphagia or odynophagia. She takes Protonix 40 mg and this works well to prevent any symptoms of retrosternal burning or acid regurgitation. Reflux symptoms have not been worse over this time frame. She does report a history of DM and her last A1C was 6.8%. She reports a previous EGD >5 years ago at [facility] which showed a hiatal hernia. There is no family hx of esophageal or gastric malignancies. She has been given Zofran for nausea which seems to work some days but some days her nausea is so bad she doesn't eat anything. Over the past two months, she reports she has lost 12 lbs, 20 lbs over the past year. Pt is very concerned about her symptoms and says "I just know something is wrong." She would like any procedures or imaging here that could help her find some answers. She was found to have potassium 2.1 in the ED.    Patient recently had drug-eluting stent placed  09/2018 by Dr. Irish Lack where it was recommended she have dual antiplatelet therapy with aspirin and Brilinta uninterrupted for 12 months.   Last Colonoscopy: Unknown  Last Endoscopy: >5 years ago    Past Medical History:  Past Medical History:  Diagnosis Date  . Asthma   . Coronary artery disease   . Depression   . Diabetes mellitus without complication ([place])   . GERD (gastroesophageal reflux disease)   . History of hiatal hernia   . Hypertension   . Pain    BACK  . Stroke ([place])    x3  . Thyroid cyst     Problem List: Patient Active Problem List   Diagnosis Date Noted  . Hypokalemia 05/29/2019  . NSTEMI (non-ST elevated myocardial infarction) ([place]) 09/23/2018  . Hx of CABG 10/30/2017  . Coronary artery disease 10/23/2017  . Unstable angina ([place]) 10/20/2017  . Cerebral artery occlusion with cerebral infarction ([place]) 08/06/2017  . Chest pain, unspecified 08/06/2017  . Diabetes ([place]) 08/06/2017  . Hyperlipidemia, unspecified 08/06/2017  . HTN (hypertension) 08/06/2017  . Lumbar radiculopathy 02/15/2017  . Adenomatous colon polyp 02/20/2014  . Benign neoplasm of colon, unspecified 02/20/2014  . Asthma 09/13/2013  . GERD (gastroesophageal reflux disease) 09/13/2013  . CVA, old, hemiparesis ([place]) 09/13/2013    Past Surgical History: Past Surgical History:  Procedure Laterality Date  . ABDOMINAL HYSTERECTOMY    . CARDIAC SURGERY    . CATARACT EXTRACTION W/PHACO Left 08/15/2017   Procedure:  CATARACT EXTRACTION PHACO AND INTRAOCULAR LENS PLACEMENT (IOC);  Surgeon: Birder Robson, MD;  Location: [facility];  Service: Ophthalmology;  Laterality: Left;  [place] 00:40.6 AP% 7.3 CDE 2.98 FLUID PACK LOT # [id number] h  . CORONARY ARTERY BYPASS GRAFT N/A 10/23/2017   Procedure: CORONARY ARTERY BYPASS GRAFTING (CABG) x two  , using left internal mammary artery and right leg greater saphenous vein harvested  endoscopically - LIMA to LAD, SVG to RCA;  Surgeon: Grace Isaac, MD;   Location: [place];  Service: Open Heart Surgery;  Laterality: N/A;  . CORONARY STENT INTERVENTION N/A 09/24/2018   Procedure: CORONARY STENT INTERVENTION;  Surgeon: Jettie Booze, MD;  Location: [place] [facility];  Service: Cardiovascular;  Laterality: N/A;  . EYE SURGERY    . LEFT HEART CATH AND CORONARY ANGIOGRAPHY Left 10/09/2017   Procedure: LEFT HEART CATH AND CORONARY ANGIOGRAPHY;  Surgeon: Dionisio [name], MD;  Location: [place] [facility];  Service: Cardiovascular;  Laterality: Left;  . LEFT HEART CATH AND CORS/GRAFTS ANGIOGRAPHY N/A 09/24/2018   Procedure: LEFT HEART CATH AND CORS/GRAFTS ANGIOGRAPHY;  Surgeon: Jettie Booze, MD;  Location: [place] [facility];  Service: Cardiovascular;  Laterality: N/A;  . TEE WITHOUT CARDIOVERSION N/A 10/23/2017   Procedure: TRANSESOPHAGEAL ECHOCARDIOGRAM (TEE);  Surgeon: Grace Isaac, MD;  Location: [place];  Service: Open Heart Surgery;  Laterality: N/A;    Allergies: Allergies  Allergen Reactions  . Lisinopril Swelling  . Tramadol Other (See Comments)    sleepiness  . Amoxicillin Rash    Don't remember   . Clavulanic Acid Rash    Home Medications: Medications Prior to Admission  Medication Sig Dispense Refill Last Dose  . aspirin EC 81 MG tablet Take 81 mg by mouth daily.   Past Week at Unknown time  . atorvastatin (LIPITOR) 40 MG tablet Take 80 mg by mouth daily.    Past Week at Unknown time  . citalopram (CELEXA) 20 MG tablet Take 20 mg by mouth daily.   Past Week at Unknown time  . fluticasone (FLONASE) 50 MCG/ACT nasal spray Place 2 sprays into both nostrils daily.   Past Week at Unknown time  . Fluticasone-Salmeterol (ADVAIR DISKUS) 500-50 MCG/DOSE AEPB Inhale 1 puff into the lungs daily as needed (SOB).    Past Week at Unknown time  . gabapentin (NEURONTIN) 100 MG capsule Take 100 mg by mouth daily.   Past Week at Unknown time  . isosorbide mononitrate (IMDUR) 30 MG 24 hr tablet Take 30 mg by mouth daily.   Past Week  at Unknown time  . losartan (COZAAR) 50 MG tablet Take 50 mg by mouth daily.  2 Past Week at Unknown time  . metFORMIN (GLUCOPHAGE) 500 MG tablet Take 500-1,000 mg by mouth 2 (two) times daily with a meal. Take 1000 mg (2 tablets) by mouth with breakfast and 500 mg (1 tablet) in the evening with supper  5 Past Week at Unknown time  . metoprolol succinate (TOPROL-XL) 50 MG 24 hr tablet Take 50 mg by mouth daily. Take with or immediately following a meal.   Past Week at Unknown time  . pantoprazole (PROTONIX) 40 MG tablet Take 40 mg by mouth.    Past Week at Unknown time  . sitaGLIPtin (JANUVIA) 50 MG tablet Take 50 mg by mouth daily.   Past Week at Unknown time  . ticagrelor (BRILINTA) 90 MG TABS tablet Take 1 tablet (90 mg total) by mouth 2 (two) times daily. 30 tablet  0 Past Week at 2000  . traZODone (DESYREL) 50 MG tablet Take 50 mg by mouth at bedtime.  5 Past Week at Unknown time  . VOLTAREN 1 % GEL Apply 2 g topically as needed (pain in shoulder).   2 Past Week at Unknown time  . acetaminophen (TYLENOL) 500 MG tablet Take 1,000 mg by mouth every 6 (six) hours as needed for mild pain or headache.   prn at prn  . albuterol (ACCUNEB) 0.63 MG/3ML nebulizer solution Take 1 ampule by nebulization every 6 (six) hours as needed for wheezing.   prn at prn  . albuterol (PROVENTIL HFA;VENTOLIN HFA) 108 (90 Base) MCG/ACT inhaler Inhale 2 puffs into the lungs every 6 (six) hours as needed for wheezing or shortness of breath.    prn at prn  . nitroGLYCERIN (NITROSTAT) 0.4 MG SL tablet Place 1 tablet (0.4 mg total) under the tongue every 5 (five) minutes x 3 doses as needed for chest pain. 25 tablet 0 prn at prn   Home medication reconciliation was completed with the patient.   Scheduled Inpatient Medications:   . enoxaparin (LOVENOX) injection  40 mg Subcutaneous Q24H  . sodium chloride flush  3 mL Intravenous Q12H    Continuous Inpatient Infusions:   . sodium chloride    . 0.9 % NaCl with KCl 40 mEq  / L      PRN Inpatient Medications:  sodium chloride, acetaminophen **OR** acetaminophen, albuterol, ondansetron **OR** ondansetron (ZOFRAN) IV, sodium chloride flush  Family History: family history includes COPD in her brother; Cirrhosis in her father; Coronary artery disease in her brother, father, and sister; Diabetes type II in her father; Heart failure in her mother; Hypertension in her father; Kidney disease in her father; Pulmonary embolism in her mother; Stroke in her sister.  The patient's family history is negative for inflammatory bowel disorders, GI malignancy, or solid organ transplantation.  Social History:   reports that she quit smoking about 20 years ago. She has never used smokeless tobacco. She reports that she does not drink alcohol or use drugs. The patient denies ETOH, tobacco, or drug use.   Review of Systems: Constitutional: Weight is stable.  Eyes: No changes in vision. ENT: No oral lesions, sore throat.  GI: see HPI.  Heme/Lymph: No easy bruising.  CV: No chest pain.  GU: No hematuria.  Integumentary: No rashes.  Neuro: No headaches.  Psych: No depression/anxiety.  Endocrine: No heat/cold intolerance.  Allergic/Immunologic: No urticaria.  Resp: No cough, SOB.  Musculoskeletal: No joint swelling.    Physical Examination: BP 136/81   Pulse 65   Temp 98.6 F (37 C) (Oral)   Resp 17   Ht 5\' 8"  (1.727 m)   Wt 63.5 kg   SpO2 99%   BMI 21.29 kg/m  Gen: NAD, alert and oriented x 4 HEENT: PEERLA, EOMI, Neck: supple, no JVD or thyromegaly Chest: CTA bilaterally, no wheezes, crackles, or other adventitious sounds CV: RRR, no m/g/c/r Abd: soft, NT, ND, +BS in all four quadrants; no HSM, guarding, ridigity, or rebound tenderness Ext: no edema, well perfused with 2+ pulses, Skin: no rash or lesions noted Lymph: no LAD  Data: Lab Results  Component Value Date   WBC 6.1 05/29/2019   HGB 13.3 05/29/2019   HCT 39.7 05/29/2019   MCV 81.0 05/29/2019   PLT  317 05/29/2019   Recent Labs  Lab 05/29/19 1046  HGB 13.3   Lab Results  Component Value Date   NA 140  05/29/2019   K 2.1 (LL) 05/29/2019   CL 107 05/29/2019   CO2 23 05/29/2019   BUN 9 05/29/2019   CREATININE 0.77 05/29/2019   Lab Results  Component Value Date   ALT 11 05/29/2019   AST 16 05/29/2019   ALKPHOS 48 05/29/2019   BILITOT 0.8 05/29/2019   No results for input(s): APTT, INR, PTT in the last 168 hours. Assessment/Plan:  58 y/o AA female with a PMH of CVA, DM, HLD, HTN, CAD s/p CABG 2018, NSTEMI 09/2018 with stent placement 09/2018 admitted for syncopal episode and generalized weakness  1. Post-prandial vomiting with nausea  2. Poor oral intake 3. Unintentional weight loss 4. Severe hypokalemia - she is receiving PO and IV replacement, pharmacy is following  - Postprandial vomiting with nausea progressively worsening over the past 2 months and ongoing over the past 18 months. Differential includes gastric outlet obstruction, peptic ulcer disease, gastritis, gastroparesis 2/2 uncontrolled DM, achalasia, IBS, functional, medication side effect, malignancy - Advise EGD with luminal evaluation. We will perform diagnostic EGD while patient is on aspirin and Brilinta. We understand that she is at a higher risk of bleeding but this is in the best interest in the patient.  - I reviewed the risks (including bleeding, perforation, infection, anesthesia complications, cardiac/respiratory complications), benefits and alternatives of EGD. Patient consents to proceed.  - NPO after midnight - Continue Protoinx - Further recs after procedure - Plan for EGD tomorrow with Dr. Alice Reichert     Thank you for the consult. Please call with questions or concerns.  [facility] [phone] (Cell)

## 2019-05-29 NOTE — ED Notes (Signed)
Lab reports critical labs K+=2.1 & Trop=0.03. Burlene Arnt, MD made aware.

## 2019-05-29 NOTE — ED Triage Notes (Addendum)
Pt comes EMS from a doctor's office with syncope and weakness over 3 days. Pt VSS with EMS. Hx of 3 strokes. Pt has been vomitting most of her food up recently. Pt has lost 12 llbs unintentionally over the past 2 months.

## 2019-05-29 NOTE — H&P (Signed)
[facility] [place] NAME: Kayla Malone    MR#:  [id number]  DATE OF BIRTH:  1961-08-01  DATE OF ADMISSION:  05/29/2019  PRIMARY CARE PHYSICIAN: Perrin Maltese, MD   REQUESTING/REFERRING PHYSICIAN: Dr. Burlene Arnt.  CHIEF COMPLAINT:   Chief Complaint  Patient presents with  . Near Syncope  . Weakness   Generalized weakness and dizziness, nausea and vomiting with decreased appetite for 18 months. HISTORY OF PRESENT ILLNESS:  Kayla Malone  is a 58 y.o. female with a known history of asthma, CAD, hypertension, diabetes, GERD, stroke and thyroid cyst.  The patient presents the ED with above chief complaints.  The patient said she has had nausea, vomiting, poor oral intake and weight loss for 18 months.  She denies any fever or chills.  But has a generalized weakness and sometimes cramping.  She was started fluid pill for hypertension 2 days ago.  She is found low potassium at 2.1 in the ED. PAST MEDICAL HISTORY:   Past Medical History:  Diagnosis Date  . Asthma   . Coronary artery disease   . Depression   . Diabetes mellitus without complication ([place])   . GERD (gastroesophageal reflux disease)   . History of hiatal hernia   . Hypertension   . Pain    BACK  . Stroke ([place])    x3  . Thyroid cyst     PAST SURGICAL HISTORY:   Past Surgical History:  Procedure Laterality Date  . ABDOMINAL HYSTERECTOMY    . CARDIAC SURGERY    . CATARACT EXTRACTION W/PHACO Left 08/15/2017   Procedure: CATARACT EXTRACTION PHACO AND INTRAOCULAR LENS PLACEMENT (IOC);  Surgeon: Birder Robson, MD;  Location: [facility];  Service: Ophthalmology;  Laterality: Left;  [place] 00:40.6 AP% 7.3 CDE 2.98 FLUID PACK LOT # [id number] h  . CORONARY ARTERY BYPASS GRAFT N/A 10/23/2017   Procedure: CORONARY ARTERY BYPASS GRAFTING (CABG) x two  , using left internal mammary artery and right leg greater saphenous vein harvested  endoscopically - LIMA to LAD, SVG to RCA;   Surgeon: Grace Isaac, MD;  Location: [place];  Service: Open Heart Surgery;  Laterality: N/A;  . CORONARY STENT INTERVENTION N/A 09/24/2018   Procedure: CORONARY STENT INTERVENTION;  Surgeon: Jettie Booze, MD;  Location: [place] [facility];  Service: Cardiovascular;  Laterality: N/A;  . EYE SURGERY    . LEFT HEART CATH AND CORONARY ANGIOGRAPHY Left 10/09/2017   Procedure: LEFT HEART CATH AND CORONARY ANGIOGRAPHY;  Surgeon: Dionisio [name], MD;  Location: [place] [facility];  Service: Cardiovascular;  Laterality: Left;  . LEFT HEART CATH AND CORS/GRAFTS ANGIOGRAPHY N/A 09/24/2018   Procedure: LEFT HEART CATH AND CORS/GRAFTS ANGIOGRAPHY;  Surgeon: Jettie Booze, MD;  Location: [place] [facility];  Service: Cardiovascular;  Laterality: N/A;  . TEE WITHOUT CARDIOVERSION N/A 10/23/2017   Procedure: TRANSESOPHAGEAL ECHOCARDIOGRAM (TEE);  Surgeon: Grace Isaac, MD;  Location: [place];  Service: Open Heart Surgery;  Laterality: N/A;    SOCIAL HISTORY:   Social History   Tobacco Use  . Smoking status: Former Smoker    Quit date: 09/13/1998    Years since quitting: 20.7  . Smokeless tobacco: Never Used  Substance Use Topics  . Alcohol use: No    FAMILY HISTORY:   Family History  Problem Relation Age of Onset  . Heart failure Mother   . Pulmonary embolism Mother   . Cirrhosis Father   . Coronary  artery disease Father   . Diabetes type II Father   . Hypertension Father   . Kidney disease Father   . Coronary artery disease Sister   . Stroke Sister   . COPD Brother   . Coronary artery disease Brother   . Breast cancer Neg Hx     DRUG ALLERGIES:   Allergies  Allergen Reactions  . Lisinopril Swelling  . Tramadol Other (See Comments)    sleepiness  . Amoxicillin Rash    Don't remember   . Clavulanic Acid Rash    REVIEW OF SYSTEMS:   Review of Systems  Constitutional: Positive for malaise/fatigue and weight loss. Negative for chills and fever.  HENT:  Negative for sore throat.   Eyes: Negative for blurred vision and double vision.  Respiratory: Negative for cough, hemoptysis, shortness of breath, wheezing and stridor.   Cardiovascular: Negative for chest pain, palpitations, orthopnea and leg swelling.  Gastrointestinal: Positive for nausea and vomiting. Negative for abdominal pain, blood in stool, diarrhea and melena.  Genitourinary: Negative for dysuria, flank pain and hematuria.  Musculoskeletal: Negative for back pain and joint pain.  Skin: Negative for rash.  Neurological: Negative for dizziness, sensory change, focal weakness, seizures, loss of consciousness, weakness and headaches.  Endo/Heme/Allergies: Negative for polydipsia.  Psychiatric/Behavioral: Negative for depression. The patient is not nervous/anxious.     MEDICATIONS AT HOME:   Prior to Admission medications   Medication Sig Start Date End Date Taking? Authorizing Provider  aspirin EC 81 MG tablet Take 81 mg by mouth daily.   Yes [provider]  atorvastatin (LIPITOR) 40 MG tablet Take 80 mg by mouth daily.    Yes [provider]  citalopram (CELEXA) 20 MG tablet Take 20 mg by mouth daily. 12/25/18  Yes [provider]  fluticasone (FLONASE) 50 MCG/ACT nasal spray Place 2 sprays into both nostrils daily.   Yes [provider]  Fluticasone-Salmeterol (ADVAIR DISKUS) 500-50 MCG/DOSE AEPB Inhale 1 puff into the lungs daily as needed (SOB).    Yes [provider]  gabapentin (NEURONTIN) 100 MG capsule Take 100 mg by mouth daily. 12/25/18  Yes [provider]  isosorbide mononitrate (IMDUR) 30 MG 24 hr tablet Take 30 mg by mouth daily. 01/31/19  Yes [provider]  losartan (COZAAR) 50 MG tablet Take 50 mg by mouth daily. 06/29/18  Yes [provider]  metFORMIN (GLUCOPHAGE) 500 MG tablet Take 500-1,000 mg by mouth 2 (two) times daily with a meal. Take 1000 mg (2 tablets) by mouth with breakfast and 500 mg  (1 tablet) in the evening with supper 02/17/17  Yes [provider]  metoprolol succinate (TOPROL-XL) 50 MG 24 hr tablet Take 50 mg by mouth daily. Take with or immediately following a meal.   Yes [provider]  pantoprazole (PROTONIX) 40 MG tablet Take 40 mg by mouth.    Yes [provider]  sitaGLIPtin (JANUVIA) 50 MG tablet Take 50 mg by mouth daily.   Yes [provider]  ticagrelor (BRILINTA) 90 MG TABS tablet Take 1 tablet (90 mg total) by mouth 2 (two) times daily. 09/25/18  Yes Kathyrn Drown D, NP  traZODone (DESYREL) 50 MG tablet Take 50 mg by mouth at bedtime. 02/19/17  Yes [provider]  VOLTAREN 1 % GEL Apply 2 g topically as needed (pain in shoulder).  11/07/17  Yes [provider]  acetaminophen (TYLENOL) 500 MG tablet Take 1,000 mg by mouth every 6 (six) hours as needed  for mild pain or headache.    [provider]  albuterol (ACCUNEB) 0.63 MG/3ML nebulizer solution Take 1 ampule by nebulization every 6 (six) hours as needed for wheezing.    [provider]  albuterol (PROVENTIL HFA;VENTOLIN HFA) 108 (90 Base) MCG/ACT inhaler Inhale 2 puffs into the lungs every 6 (six) hours as needed for wheezing or shortness of breath.     [provider]  nitroGLYCERIN (NITROSTAT) 0.4 MG SL tablet Place 1 tablet (0.4 mg total) under the tongue every 5 (five) minutes x 3 doses as needed for chest pain. 09/25/18   Kathyrn Drown D, NP      VITAL SIGNS:  Blood pressure (!) 153/85, pulse (!) 59, temperature 98.6 F (37 C), temperature source Oral, resp. rate 11, height 5\' 8"  (1.727 m), weight 63.5 kg, SpO2 99 %.  PHYSICAL EXAMINATION:  Physical Exam  GENERAL:  58 y.o.-year-old patient lying in the bed with no acute distress.  EYES: Pupils equal, round, reactive to light and accommodation. No scleral icterus. Extraocular muscles intact.  HEENT: Head atraumatic, normocephalic. Oropharynx and nasopharynx clear.   NECK:  Supple, no jugular venous distention. No thyroid enlargement, no tenderness.  LUNGS: Normal breath sounds bilaterally, no wheezing, rales,rhonchi or crepitation. No use of accessory muscles of respiration.  CARDIOVASCULAR: S1, S2 normal. No murmurs, rubs, or gallops.  ABDOMEN: Soft, nontender, nondistended. Bowel sounds present. No organomegaly or mass.  EXTREMITIES: No pedal edema, cyanosis, or clubbing.  NEUROLOGIC: Cranial nerves II through XII are intact. Muscle strength 5/5 in all extremities. Sensation intact. Gait not checked.  PSYCHIATRIC: The patient is alert and oriented x 3.  SKIN: No obvious rash, lesion, or ulcer.   LABORATORY PANEL:   CBC Recent Labs  Lab 05/29/19 1046  WBC 6.1  HGB 13.3  HCT 39.7  PLT 317   ------------------------------------------------------------------------------------------------------------------  Chemistries  Recent Labs  Lab 05/29/19 1046  NA 140  K 2.1*  CL 107  CO2 23  GLUCOSE 113*  BUN 9  CREATININE 0.77  CALCIUM 7.3*  MG 1.4*  AST 16  ALT 11  ALKPHOS 48  BILITOT 0.8   ------------------------------------------------------------------------------------------------------------------  Cardiac Enzymes Recent Labs  Lab 05/29/19 1046  TROPONINI 0.03*   ------------------------------------------------------------------------------------------------------------------  RADIOLOGY:  Ct Head Wo Contrast  Result Date: 05/29/2019 CLINICAL DATA:  Syncope and weakness over 3 days, elevated blood pressure, uncertain if she struck her head when she passed out today EXAM: CT HEAD WITHOUT CONTRAST TECHNIQUE: Contiguous axial images were obtained from the base of the skull through the vertex without intravenous contrast. Sagittal and coronal MPR images reconstructed from axial data set. COMPARISON:  05/20/2018 FINDINGS: Brain: Normal ventricular morphology. No midline shift or mass effect. Small old RIGHT pontine lacunar infarct. No  intracranial hemorrhage, mass lesion, or evidence of acute infarction. No extra-axial fluid collections. Vascular: Mild atherosclerotic calcification of internal carotid arteries at skull base Skull: Intact Sinuses/Orbits: Clear Other: N/A IMPRESSION: Small old lacunar infarct RIGHT pons. No acute intracranial abnormalities. Electronically Signed   By: Lavonia Dana M.D.   On: 05/29/2019 12:23   Dg Chest Port 1 View  Result Date: 05/29/2019 CLINICAL DATA:  Syncope and weakness over 3 days. EXAM: PORTABLE CHEST 1 VIEW COMPARISON:  August 31, 2018 FINDINGS: The heart size and mediastinal contours are within normal limits. Both lungs are clear. The visualized skeletal structures are unremarkable. IMPRESSION: No active disease. Electronically Signed   By: Dorise Bullion III M.D   On: 05/29/2019 11:22  IMPRESSION AND PLAN:   Severe hypokalemia. The patient will be admitted to medical floor. Hold diuretics. Both IV and p.o. potassium supplement and follow-up level.  Pharmacy consult for electrolyte management.  Hypomagnesemia.  IV magnesium and follow-up level.  Nausea and vomiting with poor oral intake and weight loss. GI consult.  Zofran as needed.  Dr. Alice Reichert will see the patient.  Hypertension.  Continue home hypertension medication. Diabetes.  Hold metformin and start sliding scale. All the records are reviewed and case discussed with ED provider. Management plans discussed with the patient, family and they are in agreement.  CODE STATUS: Full code.  TOTAL TIME TAKING CARE OF THIS PATIENT: 43 minutes.    Demetrios Loll M.D on 05/29/2019 at 2:46 PM  Between 7am to 6pm - Pager - [phone]  After 6pm go to [email or web address] - Proofreader  [facility]  Office  [phone]  CC: Primary care physician; Perrin Maltese, MD   Note: This dictation was prepared with Dragon dictation along with smaller phrase technology. Any transcriptional errors that  result from this process are unin

## 2019-05-29 NOTE — ED Notes (Addendum)
ED TO INPATIENT HANDOFF REPORT  ED Nurse Name and Phone #: Annie Main [phone]  S Name/Age/Gender Royetta Crochet [place] 58 y.o. female Room/Bed: ED16A/ED16A  Code Status   Code Status: Prior  Home/SNF/Other Home Patient oriented to: self, place, time and situation Is this baseline? Yes   Triage Complete: Triage complete  Chief Complaint syncope  Triage Note Pt comes EMS from a doctor's office with syncope and weakness over 3 days. Pt VSS with EMS. Hx of 3 strokes. Pt has been vomitting most of her food up recently. Pt has lost 12 llbs unintentionally over the past 2 months.    Allergies Allergies  Allergen Reactions  . Lisinopril Swelling  . Tramadol Other (See Comments)    sleepiness  . Amoxicillin Rash    Don't remember   . Clavulanic Acid Rash    Level of Care/Admitting Diagnosis ED Disposition    ED Disposition Condition [facility] Area: [place] [[id number]]  Level of Care: Med-Surg [16]  Covid Evaluation: Confirmed COVID Negative  Diagnosis: Hypokalemia [989211]  Admitting Physician: Demetrios Loll [[id number]]  Attending Physician: Demetrios Loll [[id number]]  Estimated length of stay: past midnight tomorrow  Certification:: I certify this patient will need inpatient services for at least 2 midnights  PT Class (Do Not Modify): Inpatient [101]  PT Acc Code (Do Not Modify): Private [1]       B Medical/Surgery History Past Medical History:  Diagnosis Date  . Asthma   . Coronary artery disease   . Depression   . Diabetes mellitus without complication ([place])   . GERD (gastroesophageal reflux disease)   . History of hiatal hernia   . Hypertension   . Pain    BACK  . Stroke ([place])    x3  . Thyroid cyst    Past Surgical History:  Procedure Laterality Date  . ABDOMINAL HYSTERECTOMY    . CARDIAC SURGERY    . CATARACT EXTRACTION W/PHACO Left 08/15/2017   Procedure: CATARACT EXTRACTION PHACO AND INTRAOCULAR LENS PLACEMENT (IOC);  Surgeon:  Birder Robson, MD;  Location: [facility];  Service: Ophthalmology;  Laterality: Left;  [place] 00:40.6 AP% 7.3 CDE 2.98 FLUID PACK LOT # [id number]  . CORONARY ARTERY BYPASS GRAFT N/A 10/23/2017   Procedure: CORONARY ARTERY BYPASS GRAFTING (CABG) x two  , using left internal mammary artery and right leg greater saphenous vein harvested  endoscopically - LIMA to LAD, SVG to RCA;  Surgeon: Grace Isaac, MD;  Location: [facility];  Service: Open Heart Surgery;  Laterality: N/A;  . CORONARY STENT INTERVENTION N/A 09/24/2018   Procedure: CORONARY STENT INTERVENTION;  Surgeon: Jettie Booze, MD;  Location: [facility];  Service: Cardiovascular;  Laterality: N/A;  . EYE SURGERY    . LEFT HEART CATH AND CORONARY ANGIOGRAPHY Left 10/09/2017   Procedure: LEFT HEART CATH AND CORONARY ANGIOGRAPHY;  Surgeon: Dionisio [name], MD;  Location: [facility];  Service: Cardiovascular;  Laterality: Left;  . LEFT HEART CATH AND CORS/GRAFTS ANGIOGRAPHY N/A 09/24/2018   Procedure: LEFT HEART CATH AND CORS/GRAFTS ANGIOGRAPHY;  Surgeon: Jettie Booze, MD;  Location: [facility];  Service: Cardiovascular;  Laterality: N/A;  . TEE WITHOUT CARDIOVERSION N/A 10/23/2017   Procedure: TRANSESOPHAGEAL ECHOCARDIOGRAM (TEE);  Surgeon: Grace Isaac, MD;  Location: [facility];  Service: Open Heart Surgery;  Laterality: N/A;     A IV Location/Drains/Wounds Patient Lines/Drains/Airways Status   Active Line/Drains/Airways    Name:   Placement  date:   Placement time:   Site:   Days:   Peripheral IV 05/29/19 Right Antecubital   05/29/19    1043    Antecubital   less than 1          Intake/Output Last 24 hours No intake or output data in the 24 hours ending 05/29/19 1507  Labs/Imaging Results for orders placed or performed during the hospital encounter of 05/29/19 (from the past 48 hour(s))  CBC with Differential     Status: None   Collection Time: 05/29/19 10:46 AM  Result Value Ref Range    WBC 6.1 4.0 - 10.5 K/uL   RBC 4.90 3.87 - 5.11 MIL/uL   Hemoglobin 13.3 12.0 - 15.0 g/dL   HCT 39.7 36.0 - 46.0 %   MCV 81.0 80.0 - 100.0 fL   MCH 27.1 26.0 - 34.0 pg   MCHC 33.5 30.0 - 36.0 g/dL   RDW 13.6 11.5 - 15.5 %   Platelets 317 150 - 400 K/uL   nRBC 0.0 0.0 - 0.2 %   Neutrophils Relative % 68 %   Neutro Abs 4.2 1.7 - 7.7 K/uL   Lymphocytes Relative 24 %   Lymphs Abs 1.4 0.7 - 4.0 K/uL   Monocytes Relative 7 %   Monocytes Absolute 0.4 0.1 - 1.0 K/uL   Eosinophils Relative 0 %   Eosinophils Absolute 0.0 0.0 - 0.5 K/uL   Basophils Relative 1 %   Basophils Absolute 0.0 0.0 - 0.1 K/uL   Immature Granulocytes 0 %   Abs Immature Granulocytes 0.02 0.00 - 0.07 K/uL    Comment: Performed at [facility], [address] [place], [place] [address]  Comprehensive metabolic panel     Status: Abnormal   Collection Time: 05/29/19 10:46 AM  Result Value Ref Range   Sodium 140 135 - 145 mmol/L   Potassium 2.1 (LL) 3.5 - 5.1 mmol/L    Comment: CRITICAL RESULT CALLED TO, READ BACK BY AND VERIFIED WITH JERRIE THOMPSON RN AT 1120 05/29/19 SG/FMW    Chloride 107 98 - 111 mmol/L   CO2 23 22 - 32 mmol/L   Glucose, Bld 113 (H) 70 - 99 mg/dL   BUN 9 6 - 20 mg/dL   Creatinine, Ser 0.77 0.44 - 1.00 mg/dL   Calcium 7.3 (L) 8.9 - 10.3 mg/dL   Total Protein 6.0 (L) 6.5 - 8.1 g/dL   Albumin 3.4 (L) 3.5 - 5.0 g/dL   AST 16 15 - 41 U/L   ALT 11 0 - 44 U/L   Alkaline Phosphatase 48 38 - 126 U/L   Total Bilirubin 0.8 0.3 - 1.2 mg/dL   GFR calc non Af Amer >60 >60 mL/min   GFR calc Af Amer >60 >60 mL/min   Anion gap 10 5 - 15    Comment: Performed at [facility], [address] [place], [place] [address]  Lipase, blood     Status: None   Collection Time: 05/29/19 10:46 AM  Result Value Ref Range   Lipase 34 11 - 51 U/L    Comment: Performed at [facility], [address] [place], [place] [address]  Troponin I - Once     Status: Abnormal   Collection Time:  05/29/19 10:46 AM  Result Value Ref Range   Troponin I 0.03 (HH) <0.03 ng/mL    Comment: CRITICAL RESULT CALLED TO, READ BACK BY AND VERIFIED WITH JERRIE THOMPSON RN AT 1120 05/29/19 SG/FMW Performed at [facility]  Lab, [address] [place], [place] [address]   Magnesium     Status: Abnormal   Collection Time: 05/29/19 10:46 AM  Result Value Ref Range   Magnesium 1.4 (L) 1.7 - 2.4 mg/dL    Comment: Performed at [facility], [address], [place], [place] [address]  SARS Coronavirus 2 (CEPHEID - Performed in [facility] hospital lab), Hosp Order     Status: None   Collection Time: 05/29/19 12:41 PM   Specimen: Nasopharyngeal Swab  Result Value Ref Range   SARS Coronavirus 2 NEGATIVE NEGATIVE    Comment: (NOTE) If result is NEGATIVE SARS-CoV-2 target nucleic acids are NOT DETECTED. The SARS-CoV-2 RNA is generally detectable in upper and lower  respiratory specimens during the acute phase of infection. The lowest  concentration of SARS-CoV-2 viral copies this assay can detect is 250  copies / mL. A negative result does not preclude SARS-CoV-2 infection  and should not be used as the sole basis for treatment or other  patient management decisions.  A negative result may occur with  improper specimen collection / handling, submission of specimen other  than nasopharyngeal swab, presence of viral mutation(s) within the  areas targeted by this assay, and inadequate number of viral copies  (<250 copies / mL). A negative result must be combined with clinical  observations, patient history, and epidemiological information. If result is POSITIVE SARS-CoV-2 target nucleic acids are DETECTED. The SARS-CoV-2 RNA is generally detectable in upper and lower  respiratory specimens dur ing the acute phase of infection.  Positive  results are indicative of active infection with SARS-CoV-2.  Clinical  correlation with patient history and other diagnostic information is   necessary to determine patient infection status.  Positive results do  not rule out bacterial infection or co-infection with other viruses. If result is PRESUMPTIVE POSTIVE SARS-CoV-2 nucleic acids MAY BE PRESENT.   A presumptive positive result was obtained on the submitted specimen  and confirmed on repeat testing.  While 2019 novel coronavirus  (SARS-CoV-2) nucleic acids may be present in the submitted sample  additional confirmatory testing may be necessary for epidemiological  and / or clinical management purposes  to differentiate between  SARS-CoV-2 and other Sarbecovirus currently known to infect humans.  If clinically indicated additional testing with an alternate test  methodology [phone]) is advised. The SARS-CoV-2 RNA is generally  detectable in upper and lower respiratory sp ecimens during the acute  phase of infection. The expected result is Negative. Fact Sheet for Patients:  StrictlyIdeas.no Fact Sheet for Healthcare Providers: BankingDealers.co.za This test is not yet approved or cleared by the [place] FDA and has been authorized for detection and/or diagnosis of SARS-CoV-2 by FDA under an Emergency Use Authorization (EUA).  This EUA will remain in effect (meaning this test can be used) for the duration of the COVID-19 declaration under Section 564(b)(1) of the Act, 21 U.S.C. section 360bbb-3(b)(1), unless the authorization is terminated or revoked sooner. Performed at [facility], [place], [place] [address]    Ct Head Wo Contrast  Result Date: 05/29/2019 CLINICAL DATA:  Syncope and weakness over 3 days, elevated blood pressure, uncertain if she struck her head when she passed out today EXAM: CT HEAD WITHOUT CONTRAST TECHNIQUE: Contiguous axial images were obtained from the base of the skull through the vertex without intravenous contrast. Sagittal and coronal MPR images reconstructed  from axial data set. COMPARISON:  05/20/2018 FINDINGS: Brain: Normal ventricular morphology. No midline shift  or mass effect. Small old RIGHT pontine lacunar infarct. No intracranial hemorrhage, mass lesion, or evidence of acute infarction. No extra-axial fluid collections. Vascular: Mild atherosclerotic calcification of internal carotid arteries at skull base Skull: Intact Sinuses/Orbits: Clear Other: N/A IMPRESSION: Small old lacunar infarct RIGHT pons. No acute intracranial abnormalities. Electronically Signed   By: Lavonia Dana M.D.   On: 05/29/2019 12:23   Dg Chest Port 1 View  Result Date: 05/29/2019 CLINICAL DATA:  Syncope and weakness over 3 days. EXAM: PORTABLE CHEST 1 VIEW COMPARISON:  August 31, 2018 FINDINGS: The heart size and mediastinal contours are within normal limits. Both lungs are clear. The visualized skeletal structures are unremarkable. IMPRESSION: No active disease. Electronically Signed   By: Dorise Bullion III M.D   On: 05/29/2019 11:22    Pending Labs Unresulted Labs (From admission, onward)    Start     Ordered   05/29/19 1043  Urinalysis, Complete w Microscopic  ONCE - STAT,   STAT     05/29/19 1043   Signed and Held  HIV antibody (Routine Testing)  Once,   R     Signed and Held   Signed and Held  Basic metabolic panel  Tomorrow morning,   R     Signed and Held   Signed and Held  CBC  Tomorrow morning,   R     Signed and Held   Signed and Held  Creatinine, serum  (enoxaparin (LOVENOX)    CrCl >/= 30 ml/min)  Weekly,   R    Comments: while on enoxaparin therapy    Signed and Held   Signed and Held  Hemoglobin A1c  Add-on,   R     Signed and Held   Signed and Held  Magnesium  Tomorrow morning,   R     Signed and Held          Vitals/Pain Today's Vitals   05/29/19 1200 05/29/19 1230 05/29/19 1300 05/29/19 1330  BP: (!) 168/77 (!) 145/73  (!) 153/85  Pulse: 61 60 68 (!) 59  Resp: 18 18 15 11   Temp:      TempSrc:      SpO2: 100% 99% 100% 99%  Weight:       Height:      PainSc:        Isolation Precautions No active isolations  Medications Medications  magnesium sulfate IVPB 2 g 50 mL (2 g Intravenous New Bag/Given 05/29/19 1410)  sodium chloride 0.9 % bolus 500 mL (500 mLs Intravenous Bolus 05/29/19 1049)  potassium chloride SA (K-DUR) CR tablet 40 mEq (40 mEq Oral Given 05/29/19 1239)    Mobility walks Low fall risk   Focused Assessments Cardiac Assessment Handoff:    Lab Results  Component Value Date   CKTOTAL 82 05/20/2016   TROPONINI 0.03 (HH) 05/29/2019   No results found for: DDIMER Does the Patient currently have chest pain? No     R Recommendations: See Admitting Provider Note  Report given to: Caryl Pina, RN  Additional Notes:

## 2019-05-29 NOTE — ED Provider Notes (Signed)
[facility] Emergency Department Provider Note  ____________________________________________   I have reviewed the triage vital signs and the nursing notes. Where available I have reviewed prior notes and, if possible and indicated, outside hospital notes.    HISTORY  Chief Complaint Near Syncope and Weakness    HPI Kayla Malone is a 58 y.o. female with a history of CVA with left-sided weakness more in the leg and the arm, DM reflux disease hypertension CAD, and multiple other medical problems listed below, patient seen and evaluated during the coronavirus epidemic during a time with low staffing, presents today complaining of feeling lightheaded.  Patient states that she is been having postprandial vomiting and decreased p.o. for the last 18 months.  She denies any fever or chills.  She does not have any abdominal pain.  She has lost some weight.  She has talked her doctor about this multiple times.  She also has a history of hypertension which is very poorly controlled.  She was recently started on new hypertensive medications "a fluid pill" 2 days ago as well as an increase in her isosorbide which she started yesterday.  She has not had any chest pain or exertional symptoms however she has been feeling more lightheaded especially since she started these medications and today she went to her doctor's office and passed out.  She did hit her head when she fell backwards.  No new numbness or weakness.  Has a slight headache.  She states that she did have mosquito drink as she has had intolerance of food and drink for the last year and a half.    Past Medical History:  Diagnosis Date  . Asthma   . Coronary artery disease   . Depression   . Diabetes mellitus without complication ([place])   . GERD (gastroesophageal reflux disease)   . History of hiatal hernia   . Hypertension   . Pain    BACK  . Stroke ([place])    x3  . Thyroid cyst     Patient Active Problem  List   Diagnosis Date Noted  . NSTEMI (non-ST elevated myocardial infarction) ([place]) 09/23/2018  . Hx of CABG 10/30/2017  . Coronary artery disease 10/23/2017  . Unstable angina ([place]) 10/20/2017  . Cerebral artery occlusion with cerebral infarction ([place]) 08/06/2017  . Chest pain, unspecified 08/06/2017  . Diabetes ([place]) 08/06/2017  . Hyperlipidemia, unspecified 08/06/2017  . HTN (hypertension) 08/06/2017  . Lumbar radiculopathy 02/15/2017  . Adenomatous colon polyp 02/20/2014  . Benign neoplasm of colon, unspecified 02/20/2014  . Asthma 09/13/2013  . GERD (gastroesophageal reflux disease) 09/13/2013  . CVA, old, hemiparesis ([place]) 09/13/2013    Past Surgical History:  Procedure Laterality Date  . ABDOMINAL HYSTERECTOMY    . CARDIAC SURGERY    . CATARACT EXTRACTION W/PHACO Left 08/15/2017   Procedure: CATARACT EXTRACTION PHACO AND INTRAOCULAR LENS PLACEMENT (IOC);  Surgeon: Birder Robson, MD;  Location: [facility] ORS;  Service: Ophthalmology;  Laterality: Left;  [place] 00:40.6 AP% 7.3 CDE 2.98 FLUID PACK LOT # [id number]  . CORONARY ARTERY BYPASS GRAFT N/A 10/23/2017   Procedure: CORONARY ARTERY BYPASS GRAFTING (CABG) x two  , using left internal mammary artery and right leg greater saphenous vein harvested  endoscopically - LIMA to LAD, SVG to RCA;  Surgeon: Grace Isaac, MD;  Location: [place];  Service: Open Heart Surgery;  Laterality: N/A;  . CORONARY STENT INTERVENTION N/A 09/24/2018   Procedure: CORONARY STENT INTERVENTION;  Surgeon: Jettie Booze, MD;  Location: [place] [facility];  Service: Cardiovascular;  Laterality: N/A;  . EYE SURGERY    . LEFT HEART CATH AND CORONARY ANGIOGRAPHY Left 10/09/2017   Procedure: LEFT HEART CATH AND CORONARY ANGIOGRAPHY;  Surgeon: Dionisio [name], MD;  Location: [place] [facility];  Service: Cardiovascular;  Laterality: Left;  . LEFT HEART CATH AND CORS/GRAFTS ANGIOGRAPHY N/A 09/24/2018   Procedure: LEFT HEART CATH AND CORS/GRAFTS  ANGIOGRAPHY;  Surgeon: Jettie Booze, MD;  Location: [place] [facility];  Service: Cardiovascular;  Laterality: N/A;  . TEE WITHOUT CARDIOVERSION N/A 10/23/2017   Procedure: TRANSESOPHAGEAL ECHOCARDIOGRAM (TEE);  Surgeon: Grace Isaac, MD;  Location: [place];  Service: Open Heart Surgery;  Laterality: N/A;    Prior to Admission medications   Medication Sig Start Date End Date Taking? Authorizing Provider  acetaminophen (TYLENOL) 500 MG tablet Take 1,000 mg by mouth every 6 (six) hours as needed for mild pain or headache.    [provider]  albuterol (ACCUNEB) 0.63 MG/3ML nebulizer solution Take 1 ampule by nebulization every 6 (six) hours as needed for wheezing.    [provider]  albuterol (PROVENTIL HFA;VENTOLIN HFA) 108 (90 Base) MCG/ACT inhaler Inhale 2 puffs into the lungs every 6 (six) hours as needed for wheezing or shortness of breath.     [provider]  aspirin EC 81 MG tablet Take 81 mg by mouth daily.    [provider]  atorvastatin (LIPITOR) 40 MG tablet Take 80 mg by mouth daily.     [provider]  fluticasone (FLONASE) 50 MCG/ACT nasal spray Place 2 sprays into both nostrils daily.    [provider]  Fluticasone-Salmeterol (ADVAIR DISKUS) 500-50 MCG/DOSE AEPB Inhale 1 puff into the lungs daily as needed (SOB).     [provider]  losartan (COZAAR) 50 MG tablet Take 50 mg by mouth daily. 06/29/18   [provider]  metFORMIN (GLUCOPHAGE) 500 MG tablet Take 1,000 mg by mouth every morning.  02/17/17   [provider]  metFORMIN (GLUCOPHAGE) 500 MG tablet Take 500 mg by mouth every evening.    [provider]  metoprolol succinate (TOPROL-XL) 50 MG 24 hr tablet Take 50 mg by mouth daily. Take with or immediately following a meal.    [provider]  nitroGLYCERIN (NITROSTAT) 0.4 MG SL tablet Place 1 tablet (0.4 mg total) under the tongue every 5 (five) minutes x 3 doses as  needed for chest pain. 09/25/18   Kathyrn Drown D, NP  pantoprazole (PROTONIX) 40 MG tablet Take 40 mg by mouth.     [provider]  sitaGLIPtin (JANUVIA) 50 MG tablet Take 50 mg by mouth daily.    [provider]  ticagrelor (BRILINTA) 90 MG TABS tablet Take 1 tablet (90 mg total) by mouth 2 (two) times daily. 09/25/18   Kathyrn Drown D, NP  traZODone (DESYREL) 50 MG tablet Take 50 mg by mouth at bedtime. 02/19/17   [provider]  VOLTAREN 1 % GEL Apply 2 g topically as needed (pain in shoulder).  11/07/17   [provider]    Allergies Lisinopril, Tramadol, Amoxicillin, and Clavulanic acid  Family History  Problem Relation Age of Onset  . Heart failure Mother   . Pulmonary embolism Mother   . Cirrhosis Father   . Coronary artery disease Father   . Diabetes type II Father   . Hypertension Father   . Kidney disease Father   . Coronary artery disease  Sister   . Stroke Sister   . COPD Brother   . Coronary artery disease Brother   . Breast cancer Neg Hx     Social History Social History   Tobacco Use  . Smoking status: Former Smoker    Quit date: 09/13/1998    Years since quitting: 20.7  . Smokeless tobacco: Never Used  Substance Use Topics  . Alcohol use: No  . Drug use: No    Review of Systems Constitutional: No fever/chills Eyes: No visual changes. ENT: No sore throat. No stiff neck no neck pain Cardiovascular: Denies chest pain. Respiratory: Denies shortness of breath. Gastrointestinal:   no vomiting.  No diarrhea.  No constipation. Genitourinary: Negative for dysuria. Musculoskeletal: Negative lower extremity swelling Skin: Negative for rash. Neurological: Negative for severe headaches, focal weakness or numbness.   ____________________________________________   PHYSICAL EXAM:  VITAL SIGNS: ED Triage Vitals  Enc Vitals Group     BP 05/29/19 1038 (!) 169/80     Pulse Rate 05/29/19 1038 69     Resp 05/29/19 1038 16      Temp 05/29/19 1038 98.6 F (37 C)     Temp Source 05/29/19 1038 Oral     SpO2 05/29/19 1038 100 %     Weight 05/29/19 1038 140 lb (63.5 kg)     Height 05/29/19 1038 5\' 8"  (1.727 m)     Head Circumference --      Peak Flow --      Pain Score 05/29/19 1034 0     Pain Loc --      Pain Edu? --      Excl. in [place]? --     Constitutional: Alert and oriented. Well appearing and in no acute distress. Eyes: Conjunctivae are normal Head: Atraumatic HEENT: No congestion/rhinnorhea. Mucous membranes are moist.  Oropharynx non-erythematous Neck:   Nontender with no meningismus, no masses, no stridor Cardiovascular: Normal rate, regular rhythm. Grossly normal heart sounds.  Good peripheral circulation. Respiratory: Normal respiratory effort.  No retractions. Lungs CTAB. Abdominal: Soft and nontender. No distention. No guarding no rebound Back:  There is no focal tenderness or step off.  there is no midline tenderness there are no lesions noted. there is no CVA tenderness Musculoskeletal: No lower extremity tenderness, no upper extremity tenderness. No joint effusions, no DVT signs strong distal pulses no edema Neurologic:  Normal speech and language.  No left-sided weakness is noted, patient states is no different from baseline Skin:  Skin is warm, dry and intact. No rash noted. Psychiatric: Mood and affect are normal. Speech and behavior are normal.  ____________________________________________   LABS (all labs ordered are listed, but only abnormal results are displayed)  Labs Reviewed  COMPREHENSIVE METABOLIC PANEL - Abnormal; Notable for the following components:      Result Value   Potassium 2.1 (*)    Glucose, Bld 113 (*)    Calcium 7.3 (*)    Total Protein 6.0 (*)    Albumin 3.4 (*)    All other components within normal limits  TROPONIN I - Abnormal; Notable for the following components:   Troponin I 0.03 (*)    All other components within normal limits  CBC WITH  DIFFERENTIAL/PLATELET  LIPASE, BLOOD  URINALYSIS, COMPLETE (UACMP) WITH MICROSCOPIC    Pertinent labs  results that were available during my care of the patient were reviewed by me and considered in my medical decision making (see chart for details). ____________________________________________  EKG  I personally interpreted  any EKGs ordered by me or triage Sinus rhythm at 66 bpm normal axis no acute ST elevation or depression, unremarkable EKG ____________________________________________  RADIOLOGY  Pertinent labs & imaging results that were available during my care of the patient were reviewed by me and considered in my medical decision making (see chart for details). If possible, patient and/or family made aware of any abnormal findings.  Dg Chest Port 1 View  Result Date: 05/29/2019 CLINICAL DATA:  Syncope and weakness over 3 days. EXAM: PORTABLE CHEST 1 VIEW COMPARISON:  August 31, 2018 FINDINGS: The heart size and mediastinal contours are within normal limits. Both lungs are clear. The visualized skeletal structures are unremarkable. IMPRESSION: No active disease. Electronically Signed   By: Dorise Bullion III M.D   On: 05/29/2019 11:22   ____________________________________________    PROCEDURES  Procedure(s) performed: None  Procedures  Critical Care performed: None  ____________________________________________   INITIAL IMPRESSION / ASSESSMENT AND PLAN / ED COURSE  Pertinent labs & imaging results that were available during my care of the patient were reviewed by me and considered in my medical decision making (see chart for details).  Here with several problems one is that for the last year and a half she is had decreased p.o. and a constant feeling of nausea.  And then the other is that she passed out today hitting her head.  She did not have any seizure entirely no incontinence of bowel or bladder.  She does have a slight headache will obtain CT scan of the  head, her blood work is revealing of a low potassium which we will begin repletion here, the causes of her chronic inability to eat satisfying meals is something that we will probably not figure out in the emergency department.  However, given syncope and hypokalemia, we likely will admit to the hospital for further evaluation.    ____________________________________________   FINAL CLINICAL IMPRESSION(S) / ED DIAGNOSES  Final diagnoses:  Syncope      This chart was dictated using voice recognition software.  Despite best efforts to proofread,  errors can occur which can change meaning.     Schuyler Amor, MD 05/29/19 1135

## 2019-05-30 ENCOUNTER — Inpatient Hospital Stay: Payer: Medicare Other | Admitting: Certified Registered"

## 2019-05-30 ENCOUNTER — Encounter
Admission: EM | Disposition: A | Discharge status: Home / Self Care | Payer: Self-pay | Source: Home / Self Care | Attending: Internal Medicine

## 2019-05-30 DIAGNOSIS — E876 Hypokalemia: Secondary | ICD-10-CM | POA: Diagnosis not present

## 2019-05-30 DIAGNOSIS — R531 Weakness: Secondary | ICD-10-CM | POA: Diagnosis not present

## 2019-05-30 HISTORY — PX: ESOPHAGOGASTRODUODENOSCOPY: SHX5428

## 2019-05-30 LAB — BASIC METABOLIC PANEL
Anion gap: 8 (ref 5–15)
BUN: 10 mg/dL (ref 6–20)
CO2: 26 mmol/L (ref 22–32)
Calcium: 9.2 mg/dL (ref 8.9–10.3)
Chloride: 106 mmol/L (ref 98–111)
Creatinine, Ser: 0.92 mg/dL (ref 0.44–1.00)
GFR calc Af Amer: >60 mL/min (ref >60)
GFR calc non Af Amer: >60 mL/min (ref >60)
Glucose, Bld: 97 mg/dL (ref 70–99)
Potassium: 4.1 mmol/L (ref 3.5–5.1)
Sodium: 140 mmol/L (ref 135–145)

## 2019-05-30 LAB — CBC
HCT: 40.9 % (ref 36.0–46.0)
Hemoglobin: 13.5 g/dL (ref 12.0–15.0)
MCH: 27.3 pg (ref 26.0–34.0)
MCHC: 33 g/dL (ref 30.0–36.0)
MCV: 82.6 fL (ref 80.0–100.0)
Platelets: 302 10*3/uL (ref 150–400)
RBC: 4.95 MIL/uL (ref 3.87–5.11)
RDW: 13.7 % (ref 11.5–15.5)
WBC: 5.6 10*3/uL (ref 4.0–10.5)
nRBC: 0 % (ref 0.0–0.2)

## 2019-05-30 LAB — GLUCOSE, CAPILLARY
Glucose-Capillary: 118 mg/dL — ABNORMAL HIGH (ref 70–99)
Glucose-Capillary: 95 mg/dL (ref 70–99)
Glucose-Capillary: 101 mg/dL — ABNORMAL HIGH (ref 70–99)
Glucose-Capillary: 120 mg/dL — ABNORMAL HIGH (ref 70–99)
Glucose-Capillary: 98 mg/dL (ref 70–99)

## 2019-05-30 LAB — PHOSPHORUS: Phosphorus: 3.3 mg/dL (ref 2.5–4.6)

## 2019-05-30 LAB — MAGNESIUM: Magnesium: 1.9 mg/dL (ref 1.7–2.4)

## 2019-05-30 SURGERY — EGD (ESOPHAGOGASTRODUODENOSCOPY)
Anesthesia: General

## 2019-05-30 MED ORDER — ONDANSETRON HCL 4 MG/2ML IJ SOLN
INTRAMUSCULAR | Status: DC | PRN
  Administered 2019-05-30: 4 mg via INTRAVENOUS

## 2019-05-30 MED ORDER — GLYCOPYRROLATE 0.2 MG/ML IJ SOLN
INTRAMUSCULAR | Status: DC | PRN
  Administered 2019-05-30: 0.2 mg via INTRAVENOUS

## 2019-05-30 MED ORDER — PROPOFOL 10 MG/ML IV BOLUS
INTRAVENOUS | Status: DC | PRN
  Administered 2019-05-30 (×2): 40 mg via INTRAVENOUS
  Administered 2019-05-30: 20 mg via INTRAVENOUS

## 2019-05-30 MED ORDER — HYDRALAZINE HCL 20 MG/ML IJ SOLN
10.0000 mg | Freq: Four times a day (QID) | INTRAMUSCULAR | Status: DC | PRN
  Administered 2019-05-30: 19:00:00 10 mg via INTRAVENOUS
  Filled 2019-05-30: qty 1

## 2019-05-30 NOTE — Op Note (Signed)
[facility] Gastroenterology Patient Name: Kayla Malone Procedure Date: 05/30/2019 10:54 AM MRN: [id number] Account #: [id number] Date of Birth: 1961/10/25 Admit Type: Inpatient Age: 58 Room: [facility] ENDO ROOM 4 Gender: Female Note Status: Finalized Procedure:            Upper GI endoscopy Indications:          Epigastric abdominal pain, Persistent vomiting,                        Persistent vomiting of unknown cause Providers:            Benay Pike. Armentha Branagan MD, MD Medicines:            Propofol per Anesthesia Complications:        No immediate complications. Procedure:            Pre-Anesthesia Assessment:                       - The risks and benefits of the procedure and the                        sedation options and risks were discussed with the                        patient. All questions were answered and informed                        consent was obtained.                       - Patient identification and proposed procedure were                        verified prior to the procedure by the nurse. The                        procedure was verified in the procedure room.                       - ASA Grade Assessment: III - A patient with severe                        systemic disease.                       - After reviewing the risks and benefits, the patient                        was deemed in satisfactory condition to undergo the                        procedure.                       After obtaining informed consent, the endoscope was                        passed under direct vision. Throughout the procedure,                        the patient's blood pressure, pulse, and oxygen  saturations were monitored continuously. The Endoscope                        was introduced through the mouth, and advanced to the                        third part of duodenum. The upper GI endoscopy was                        accomplished without  difficulty. The patient tolerated                        the procedure well. Findings:      The examined esophagus was normal.      The entire examined stomach was normal.      The examined duodenum was normal.      The exam was otherwise without abnormality. Impression:           - Normal esophagus.                       - Normal stomach.                       - Normal examined duodenum.                       - The examination was otherwise normal.                       - No specimens collected. Recommendation:       - Return patient to hospital ward for observation.                       - Clear liquid diet.                       - Advance diet as tolerated. Procedure Code(s):    --- Professional ---                       [id number], Esophagogastroduodenoscopy, flexible, transoral;                        diagnostic, including collection of specimen(s) by                        brushing or washing, when performed (separate procedure) Diagnosis Code(s):    --- Professional ---                       P38.25, Cyclical vomiting syndrome unrelated to migraine                       R10.13, Epigastric pain CPT copyright 2019 American Medical Association. All rights reserved. The codes documented in this report are preliminary and upon coder review may  be revised to meet current compliance requirements. Efrain Sella MD, MD 05/30/2019 12:18:36 PM This report has been signed electronically. Number of Addenda: 0 Note Initiated On: 05/30/2019 10:54 AM Estimated Blood Loss: Estimated blood loss: none.      [facility]

## 2019-05-30 NOTE — Anesthesia Preprocedure Evaluation (Signed)
Anesthesia Evaluation  Patient identified by MRN, date of birth, ID band Patient awake    Reviewed: Allergy & Precautions, NPO status , Patient's Chart, lab work & pertinent test results, reviewed documented beta blocker date and time   History of Anesthesia Complications Negative for: history of anesthetic complications  Airway Mallampati: III       Dental   Pulmonary asthma , neg sleep apnea, neg COPD, former smoker,           Cardiovascular hypertension, Pt. on medications and Pt. on home beta blockers + CAD, + Past MI and + CABG  (-) CHF (-) dysrhythmias (-) Valvular Problems/Murmurs     Neuro/Psych Depression CVA    GI/Hepatic Neg liver ROS, hiatal hernia, GERD  Medicated,  Endo/Other  diabetes, Oral Hypoglycemic Agents  Renal/GU negative Renal ROS     Musculoskeletal   Abdominal   Peds  Hematology   Anesthesia Other Findings   Reproductive/Obstetrics                             Anesthesia Physical Anesthesia Plan  ASA: III  Anesthesia Plan: General   Post-op Pain Management:    Induction: Intravenous  PONV Risk Score and Plan: TIVA, Propofol infusion and Treatment may vary due to age or medical condition  Airway Management Planned: Nasal Cannula  Additional Equipment:   Intra-op Plan:   Post-operative Plan:   Informed Consent: I have reviewed the patients History and Physical, chart, labs and discussed the procedure including the risks, benefits and alternatives for the proposed anesthesia with the patient or authorized representative who has indicated his/her understanding and acceptance.       Plan Discussed with:   Anesthesia Plan Comments:         Anesthesia Quick Evaluation

## 2019-05-30 NOTE — Anesthesia Postprocedure Evaluation (Signed)
Anesthesia Post Note  Patient: Kayla Malone  Procedure(s) Performed: ESOPHAGOGASTRODUODENOSCOPY (EGD) (N/A )  Patient location during evaluation: Endoscopy Anesthesia Type: General Level of consciousness: awake and alert Pain management: pain level controlled Vital Signs Assessment: post-procedure vital signs reviewed and stable Respiratory status: spontaneous breathing and respiratory function stable Cardiovascular status: stable Anesthetic complications: no     Last Vitals:  Vitals:   05/30/19 1200 05/30/19 1221  BP: 135/83 111/71  Pulse: 66   Resp: 16   Temp:  (!) 36.1 C  SpO2: 100%     Last Pain:  Vitals:   05/30/19 1221  TempSrc: Tympanic  PainSc: Asleep                 Kimmi Acocella K

## 2019-05-30 NOTE — Anesthesia Post-op Follow-up Note (Signed)
Anesthesia QCDR form completed.        

## 2019-05-30 NOTE — Consult Note (Signed)
PHARMACY CONSULT NOTE - FOLLOW UP  Pharmacy Consult for Electrolyte Monitoring and Replacement   Recent Labs: Potassium (mmol/L)  Date Value  05/30/2019 4.1  10/17/2012 3.6   Magnesium (mg/dL)  Date Value  05/30/2019 1.9   Calcium (mg/dL)  Date Value  05/30/2019 9.2   Calcium, Total (mg/dL)  Date Value  06/10/2012 8.8   Albumin (g/dL)  Date Value  05/29/2019 3.4 (L)  06/10/2012 3.8   Phosphorus (mg/dL)  Date Value  05/30/2019 3.3   Sodium (mmol/L)  Date Value  05/30/2019 140  06/10/2012 140   Assessment: 58 y.o. female with a known history of asthma, CAD, hypertension, diabetes, GERD, stroke and thyroid cyst.  The patient presents the ED with nausea, vomiting, poor oral intake and weight loss for 18 months.  Pt presented with a potassium lab value of 2.1.  Pt was given KCl 74meq po x 1 and started on NS with 65meq KCl @ 125ml/hr.  At a recheck @ 1835, potassium level had improved to 3.3.  Pt presented with a magnesium value of 1.4.  Patient received 2g Mg Sulfate IV x 1.  Goal of Therapy:  Electrolytes wnl's  Plan:  Pt will receiving 26meq of KCl with current IV fluids.  Will not give additional replenishment at this time.    Will recheck K, Mg, and Phos with am labs and replenish if warranted per pharmacy consult.  Pearla Dubonnet, PharmD Clinical Pharmacist 05/30/2019 9:02 AM

## 2019-05-30 NOTE — Transfer of Care (Signed)
Immediate Anesthesia Transfer of Care Note  Patient: Kayla Malone  Procedure(s) Performed: ESOPHAGOGASTRODUODENOSCOPY (EGD) (N/A )  Patient Location: Endoscopy Unit  Anesthesia Type:General  Level of Consciousness: awake, drowsy and patient cooperative  Airway & Oxygen Therapy: Patient Spontanous Breathing and Patient connected to face mask oxygen  Post-op Assessment: Report given to RN and Post -op Vital signs reviewed and stable  Post vital signs: Reviewed and stable  Last Vitals:  Vitals Value Taken Time  BP 111/71 05/30/19 1221  Temp 36.1 C 05/30/19 1221  Pulse 88 05/30/19 1222  Resp 12 05/30/19 1222  SpO2 100 % 05/30/19 1222  Vitals shown include unvalidated device data.  Last Pain:  Vitals:   05/30/19 1221  TempSrc: Tympanic  PainSc: Asleep         Complications: No apparent anesthesia complications

## 2019-05-30 NOTE — Progress Notes (Signed)
Attempt x2 to give report to nurse. Pt alert, verbal. Pt stable, no noted distress. Denies pain or discomfort.

## 2019-05-30 NOTE — Interval H&P Note (Signed)
History and Physical Interval Note:  05/30/2019 11:57 AM  Kayla Malone  has presented today for surgery, with the diagnosis of Postprandial nausea and vomiting, unintentional weight loss.  The various methods of treatment have been discussed with the patient and family. After consideration of risks, benefits and other options for treatment, the patient has consented to  Procedure(s): ESOPHAGOGASTRODUODENOSCOPY (EGD) (N/A) as a surgical intervention.  The patient's history has been reviewed, patient examined, no change in status, stable for surgery.  I have reviewed the patient's chart and labs.  Questions were answered to the patient's satisfaction.     [place], [place]

## 2019-05-30 NOTE — Progress Notes (Signed)
Initial Nutrition Assessment  RD working remotely.  DOCUMENTATION CODES:   Not applicable  INTERVENTION:  Once diet able to be advanced, provide Ensure Enlive po TID, each supplement provides 350 kcal and 20 grams of protein.  Also recommend daily MVI.  Patient would also benefit from nutrition education to promote tolerance of PO intake pending EGD results.  NUTRITION DIAGNOSIS:   Inadequate oral intake related to decreased appetite, nausea, vomiting as evidenced by (per chart).  GOAL:   Patient will meet greater than or equal to 90% of their needs  MONITOR:   Diet advancement, PO intake, Supplement acceptance, Labs, Weight trends, I & O's  REASON FOR ASSESSMENT:   Malnutrition Screening Tool    ASSESSMENT:   58 year old female with PMHx of DM, HTN, asthma, hx CVAx3, GERD, hx hiatal hernia, depression, back pain, CAD admitted with N/V with poor oral intake and weight loss, severe hypokalemia, hypomagnesemia.   Attempted to call patient's phone for nutrition/weight history but she was unable to answer. Noted plan was for EGD today so she may be off the floor for this procedure. Per detailed history in GI note patient has had postprandial N/V for the past 18 months that has resulted in decreased PO intake. She reported that over the past 2 months the emesis occurs within 10 minutes of eating/drinking. She also reported losing 12 lbs over the past 2 months and 20 lbs over the past year. Per weight history in chart patient was 71.2 kg on 05/20/2018. She is currently 63.5 kg (140 lbs). She has lost 7.7 kg (10.8% body weight) over the past year, which is not significant for time but is still concerning. Patient is at risk for malnutrition. Unable to confirm if patient is malnourished without full history or NFPE.  Medications reviewed and include: atorvastatin, gabapentin, Novolog 0-9 units TID, Novolog 0-5 units QHS, pantoprazole, NS with KCl 40 mEq/L at 100 mL/hr.  Labs reviewed:  CBG 94-118. Potassium, Phosphorus, and Magnesium all WNL today (pharmacy is monitoring electrolytes and replacing as needed).  NUTRITION - FOCUSED PHYSICAL EXAM:  Unable to complete at this time.  Diet Order:   Diet Order            Diet NPO time specified  Diet effective midnight             EDUCATION NEEDS:   No education needs have been identified at this time  Skin:  Skin Assessment: Reviewed RN Assessment  Last BM:  05/28/2019 per chart  Height:   Ht Readings from Last 1 Encounters:  05/29/19 5\' 8"  (1.727 m)   Weight:   Wt Readings from Last 1 Encounters:  05/29/19 63.5 kg   Ideal Body Weight:  63.6 kg  BMI:  Body mass index is 21.29 kg/m.  Estimated Nutritional Needs:   Kcal:  1500-1700  Protein:  75-85 grams  Fluid:  1.5-1.7 L/day  Willey Blade, MS, RD, LDN Office: [phone] Pager: [phone] After Hours/Weekend Pager: [phone]

## 2019-05-31 ENCOUNTER — Encounter: Payer: Self-pay | Admitting: Internal Medicine

## 2019-05-31 DIAGNOSIS — E876 Hypokalemia: Secondary | ICD-10-CM | POA: Diagnosis not present

## 2019-05-31 DIAGNOSIS — R531 Weakness: Secondary | ICD-10-CM | POA: Diagnosis not present

## 2019-05-31 LAB — GLUCOSE, CAPILLARY
Glucose-Capillary: 94 mg/dL (ref 70–99)
Glucose-Capillary: 129 mg/dL — ABNORMAL HIGH (ref 70–99)
Glucose-Capillary: 89 mg/dL (ref 70–99)

## 2019-05-31 LAB — BASIC METABOLIC PANEL
Anion gap: 7 (ref 5–15)
BUN: 8 mg/dL (ref 6–20)
CO2: 22 mmol/L (ref 22–32)
Calcium: 9.1 mg/dL (ref 8.9–10.3)
Chloride: 111 mmol/L (ref 98–111)
Creatinine, Ser: 0.85 mg/dL (ref 0.44–1.00)
GFR calc Af Amer: >60 mL/min (ref >60)
GFR calc non Af Amer: >60 mL/min (ref >60)
Glucose, Bld: 106 mg/dL — ABNORMAL HIGH (ref 70–99)
Potassium: 4.5 mmol/L (ref 3.5–5.1)
Sodium: 140 mmol/L (ref 135–145)

## 2019-05-31 LAB — PHOSPHORUS: Phosphorus: 3.2 mg/dL (ref 2.5–4.6)

## 2019-05-31 LAB — MAGNESIUM: Magnesium: 1.6 mg/dL — ABNORMAL LOW (ref 1.7–2.4)

## 2019-05-31 LAB — HIV ANTIBODY (ROUTINE TESTING W REFLEX): HIV Screen 4th Generation wRfx: NONREACTIVE

## 2019-05-31 MED ORDER — POTASSIUM CHLORIDE ER 20 MEQ PO TBCR: 20.0000 meq | EXTENDED_RELEASE_TABLET | Freq: Every day | ORAL | 0 refills | Status: DC

## 2019-05-31 MED ORDER — METOPROLOL SUCCINATE ER 25 MG PO TB24
25.0000 mg | ORAL_TABLET | Freq: Every day | ORAL | Status: DC
  Administered 2019-05-31: 25 mg via ORAL
  Filled 2019-05-31: qty 1

## 2019-05-31 MED ORDER — MAGNESIUM SULFATE 2 GM/50ML IV SOLN
2.0000 g | Freq: Once | INTRAVENOUS | Status: AC
  Administered 2019-05-31: 2 g via INTRAVENOUS
  Filled 2019-05-31: qty 50

## 2019-05-31 MED ORDER — METOCLOPRAMIDE HCL 5 MG PO TABS
5.0000 mg | ORAL_TABLET | Freq: Three times a day (TID) | ORAL | Status: DC
  Administered 2019-05-31 (×3): 5 mg via ORAL
  Filled 2019-05-31 (×3): qty 1

## 2019-05-31 MED ORDER — METOCLOPRAMIDE HCL 5 MG PO TABS: 5.0000 mg | ORAL_TABLET | Freq: Three times a day (TID) | ORAL | 0 refills | Status: DC

## 2019-05-31 NOTE — Progress Notes (Signed)
GI Inpatient Follow-up Note  Subjective:  Patient seen in f/u for recurrent, persistent nausea and vomiting. She had EGD performed yesterday by Dr. Alice Reichert showing normal esophagus, stomach, and duodenum with no evidence of gastric outlet obstruction, stricture, or mass. She denies any recurrent nausea or vomiting over the past 24 hours. She has been transitioned to a soft diet and is tolerating this without difficulty. She had some broccoli for lunch. She is feeling well this afternoon with no acute complaints. She has not had a BM today. She was started on Reglan 5mg  TID prior to meals. She denies any prior history of gastroparesis. She says the plan is for her to go home later this afternoon if she doesn't have any more episodes of nausea or vomiting.   Scheduled Inpatient Medications:  . aspirin EC  81 mg Oral Daily  . atorvastatin  80 mg Oral Daily  . citalopram  20 mg Oral Daily  . enoxaparin (LOVENOX) injection  40 mg Subcutaneous Q24H  . fluticasone  2 spray Each Nare Daily  . gabapentin  100 mg Oral Daily  . insulin aspart  0-5 Units Subcutaneous QHS  . insulin aspart  0-9 Units Subcutaneous TID WC  . isosorbide mononitrate  30 mg Oral Daily  . losartan  50 mg Oral Daily  . metoCLOPramide  5 mg Oral TID AC  . metoprolol succinate  25 mg Oral Daily  . mometasone-formoterol  2 puff Inhalation BID  . pantoprazole  40 mg Oral Daily  . sodium chloride flush  3 mL Intravenous Q12H  . ticagrelor  90 mg Oral BID  . traZODone  100 mg Oral QHS    Continuous Inpatient Infusions:   . sodium chloride    . 0.9 % NaCl with KCl 40 mEq / L 100 mL/hr (05/31/19 0559)    PRN Inpatient Medications:  sodium chloride, acetaminophen **OR** acetaminophen, albuterol, hydrALAZINE, nitroGLYCERIN, ondansetron **OR** ondansetron (ZOFRAN) IV, sodium chloride flush  Review of Systems: Constitutional: Weight is stable.  Eyes: No changes in vision. ENT: No oral lesions, sore throat.  GI: see HPI.   Heme/Lymph: No easy bruising.  CV: No chest pain.  GU: No hematuria.  Integumentary: No rashes.  Neuro: No headaches.  Psych: No depression/anxiety.  Endocrine: No heat/cold intolerance.  Allergic/Immunologic: No urticaria.  Resp: No cough, SOB.  Musculoskeletal: No joint swelling.    Physical Examination: BP 128/79 (BP Location: Left Arm)   Pulse 77   Temp 97.9 F (36.6 C)   Resp 18   Ht 5\' 8"  (1.727 m)   Wt 63.5 kg   SpO2 100%   BMI 21.29 kg/m  Gen: NAD, alert and oriented x 4 HEENT: PEERLA, EOMI, Neck: supple, no JVD or thyromegaly Chest: CTA bilaterally, no wheezes, crackles, or other adventitious sounds CV: RRR, no m/g/c/r Abd: soft, NT, ND, +BS in all four quadrants; no HSM, guarding, ridigity, or rebound tenderness Ext: no edema, well perfused with 2+ pulses, Skin: no rash or lesions noted Lymph: no LAD  Data: Lab Results  Component Value Date   WBC 5.6 05/30/2019   HGB 13.5 05/30/2019   HCT 40.9 05/30/2019   MCV 82.6 05/30/2019   PLT 302 05/30/2019   Recent Labs  Lab 05/29/19 1046 05/30/19 0620  HGB 13.3 13.5   Lab Results  Component Value Date   NA 140 05/31/2019   K 4.5 05/31/2019   CL 111 05/31/2019   CO2 22 05/31/2019   BUN 8 05/31/2019   CREATININE 0.85  05/31/2019   Lab Results  Component Value Date   ALT 11 05/29/2019   AST 16 05/29/2019   ALKPHOS 48 05/29/2019   BILITOT 0.8 05/29/2019   No results for input(s): APTT, INR, PTT in the last 168 hours. Assessment/Plan:  58 y/o AA female with a PMH of DM, HTN, CAD, asthma, GERD, stroke, and thyroid cyst admitted for recurrent, persistent nausea and vomiting and unintentional weight loss x 18 months  1. Non-intractable nausea and vomiting 2. Unintentional weight loss 3. Hypokalemia, hypomagnesemia - pharmacy following, hypokalemia is resolved  - EGD performed yesterday 6/18 was completely normal with no evidence of GOO, stricture, or mass - Reglan 5 mg TID was started today for  anti-emetic and potentially helping for any underlying gastroparesis. - She may continue low-dose of Reglan for now, but would not like her to be on this long term due to potential side effect of tardive dyskinesia - Information about gastroparesis was discussed and we discussed low-fat diet and small, frequent meals throughout the day - She would need a gastric emptying scan to confidently rule out gastroparesis. Can consider as outpatient - If her weight loss continues, she might benefit from imaging of her abdomen and pelvis. Continue to monitor. - Resume home dose PPI - Stable for discharge and outpatient GI f/u  Please call with questions or concerns.    Octavia Bruckner, PA-C [facility] [phone] (Cell)

## 2019-05-31 NOTE — Progress Notes (Signed)
[facility] [place] NAME: Kayla Malone    MR#:  [id number]  DATE OF BIRTH:  [birth year]  SUBJECTIVE:  CHIEF COMPLAINT:   Chief Complaint  Patient presents with  . Near Syncope  . Weakness   He is to have nausea and gagging Waiting for endoscopy  REVIEW OF SYSTEMS:    Review of Systems  Constitutional: Negative for chills and fever.  HENT: Negative for sore throat.   Eyes: Negative for blurred vision, double vision and pain.  Respiratory: Negative for cough, hemoptysis, shortness of breath and wheezing.   Cardiovascular: Negative for chest pain, palpitations, orthopnea and leg swelling.  Gastrointestinal: Negative for abdominal pain, constipation, diarrhea, heartburn, nausea and vomiting.  Genitourinary: Negative for dysuria and hematuria.  Musculoskeletal: Negative for back pain and joint pain.  Skin: Negative for rash.  Neurological: Negative for sensory change, speech change, focal weakness and headaches.  Endo/Heme/Allergies: Does not bruise/bleed easily.  Psychiatric/Behavioral: Negative for depression. The patient is not nervous/anxious.     DRUG ALLERGIES:   Allergies  Allergen Reactions  . Lisinopril Swelling  . Tramadol Other (See Comments)    sleepiness  . Amoxicillin Rash    Don't remember   . Clavulanic Acid Rash    VITALS:  Blood pressure 128/79, pulse 77, temperature 97.9 F (36.6 C), resp. rate 18, height 5\' 8"  (1.727 m), weight 63.5 kg, SpO2 100 %.  PHYSICAL EXAMINATION:   Physical Exam  GENERAL:  58 y.o.-year-old patient lying in the bed with no acute distress.  EYES: Pupils equal, round, reactive to light and accommodation. No scleral icterus. Extraocular muscles intact.  HEENT: Head atraumatic, normocephalic. Oropharynx and nasopharynx clear.  NECK:  Supple, no jugular venous distention. No thyroid enlargement, no tenderness.  LUNGS: Normal breath sounds bilaterally, no wheezing, rales, rhonchi.  No use of accessory muscles of respiration.  CARDIOVASCULAR: S1, S2 normal. No murmurs, rubs, or gallops.  ABDOMEN: Soft, nontender, nondistended. Bowel sounds present. No organomegaly or mass.  EXTREMITIES: No cyanosis, clubbing or edema b/l.    NEUROLOGIC: Cranial nerves II through XII are intact. No focal Motor or sensory deficits b/l.   PSYCHIATRIC: The patient is alert and oriented x 3.  SKIN: No obvious rash, lesion, or ulcer.   LABORATORY PANEL:   CBC Recent Labs  Lab 05/30/19 0620  WBC 5.6  HGB 13.5  HCT 40.9  PLT 302   ------------------------------------------------------------------------------------------------------------------ Chemistries  Recent Labs  Lab 05/29/19 1046  05/31/19 0811  NA 140   < > 140  K 2.1*   < > 4.5  CL 107   < > 111  CO2 23   < > 22  GLUCOSE 113*   < > 106*  BUN 9   < > 8  CREATININE 0.77   < > 0.85  CALCIUM 7.3*   < > 9.1  MG 1.4*   < > 1.6*  AST 16  --   --   ALT 11  --   --   ALKPHOS 48  --   --   BILITOT 0.8  --   --    < > = values in this interval not displayed.   ------------------------------------------------------------------------------------------------------------------  Cardiac Enzymes Recent Labs  Lab 05/29/19 1046  TROPONINI 0.03*   ------------------------------------------------------------------------------------------------------------------  RADIOLOGY:  No results found.   ASSESSMENT AND PLAN:   * Severe hypokalemia. Due to decreased oral intake and vomiting Replaced aggressively oral and IV.  Improved.  * Hypomagnesemia.  Replaced and stable  * Postprandial vomiting.  Concern regarding stricture or obstruction.  Waiting for EGD. PPI.  Zofran as needed  *Hypertension.  Continue home medications  All the records are reviewed and case discussed with Care Management/Social Worker Management plans discussed with the patient, family and they are in agreement.  CODE STATUS: Full code  DVT  Prophylaxis: SCDs  TOTAL TIME TAKING CARE OF THIS PATIENT: 30 minutes.   POSSIBLE D/C IN 1-2 DAYS, DEPENDING ON CLINICAL CONDITION.  Neita Carp M.D on 05/31/2019 at 1:31 PM  Between 7am to 6pm - Pager - [phone]  After 6pm go to [email or web address] - password [email or web address] [facility]  Office  [phone]  CC: Primary care physician; Perrin Maltese, MD  Note: This dictation was prepared with Dragon dictation along with smaller phrase technology. Any transcriptional errors that result from this process are unintentional.

## 2019-05-31 NOTE — Care Management Important Message (Signed)
Important Message  Patient Details  Name: Kayla Malone MRN: [id number] Date of Birth: Jul 19, 1961   Medicare Important Message Given:  Yes     Dannette Barbara 05/31/2019, 10:29 AM

## 2019-05-31 NOTE — Progress Notes (Signed)
Pt is being discharged home.  AVS discharge papers including meds and f/u appointments given and explained to pt.  Pt verbalized understanding. Rx to be picked up from pharmacy.  Awaiting transportation.

## 2019-05-31 NOTE — Consult Note (Signed)
PHARMACY CONSULT NOTE - FOLLOW UP  Pharmacy Consult for Electrolyte Monitoring and Replacement   Recent Labs: Potassium (mmol/L)  Date Value  05/30/2019 4.1  10/17/2012 3.6   Magnesium (mg/dL)  Date Value  05/30/2019 1.9   Calcium (mg/dL)  Date Value  05/30/2019 9.2   Calcium, Total (mg/dL)  Date Value  06/10/2012 8.8   Albumin (g/dL)  Date Value  05/29/2019 3.4 (L)  06/10/2012 3.8   Phosphorus (mg/dL)  Date Value  05/30/2019 3.3   Sodium (mmol/L)  Date Value  05/30/2019 140  06/10/2012 140   Assessment: 58 y.o. female with a known history of asthma, CAD, hypertension, diabetes, GERD, stroke and thyroid cyst.  The patient presents the ED with nausea, vomiting, poor oral intake and weight loss for 18 months.  6/18: K 4.1 Mg 1.9 Phos 3.3 6/19: K 4.5 Mg 1.6 Phos 3.2   Goal of Therapy:  Electrolytes wnl's  Plan:  Pt will receiving 71meq of KCl with current IV fluids. Will order Mag Sulfate 2g IV x 1 dose to replenish.    Will recheck K and Mg levels with am labs and replenish if warranted per pharmacy consult.  Pearla Dubonnet, PharmD Clinical Pharmacist 05/31/2019 7:17 AM

## 2019-06-07 DIAGNOSIS — R112 Nausea with vomiting, unspecified: Secondary | ICD-10-CM

## 2019-06-07 DIAGNOSIS — R634 Abnormal weight loss: Secondary | ICD-10-CM

## 2019-06-07 HISTORY — DX: Nausea with vomiting, unspecified: R11.2

## 2019-06-07 HISTORY — DX: Abnormal weight loss: R63.4

## 2019-06-11 NOTE — Discharge Summary (Signed)
Kayla Malone at [place] NAME: Kayla Malone    MR#:  [id number]  DATE OF BIRTH:  Jun 12, 1961  DATE OF ADMISSION:  05/29/2019 ADMITTING PHYSICIAN: Demetrios Loll, MD  DATE OF DISCHARGE: 05/31/2019  5:24 PM  PRIMARY CARE PHYSICIAN: Perrin Maltese, MD   ADMISSION DIAGNOSIS:  Hypokalemia [E87.6] Syncope [R55]  DISCHARGE DIAGNOSIS:  Active Problems:   Hypokalemia   SECONDARY DIAGNOSIS:   Past Medical History:  Diagnosis Date  . Asthma   . Coronary artery disease   . Depression   . Diabetes mellitus without complication ([place])   . GERD (gastroesophageal reflux disease)   . History of hiatal hernia   . Hypertension   . Pain    BACK  . Stroke ([place])    x3  . Thyroid cyst      ADMITTING HISTORY  Kayla Malone  is a 58 y.o. female with a known history of asthma, CAD, hypertension, diabetes, GERD, stroke and thyroid cyst.  The patient presents the ED with above chief complaints.  The patient said she has had nausea, vomiting, poor oral intake and weight loss for 18 months.  She denies any fever or chills.  But has a generalized weakness and sometimes cramping.  She was started fluid pill for hypertension 2 days ago.  She is found low potassium at 2.1 in the ED.  HOSPITAL COURSE:   * Severe hypokalemia. Due to decreased oral intake and vomiting Replaced aggressively oral and IV.  Improved. Started on oral potassium at discharge.  Prescription given.  * Hypomagnesemia.   Replaced and stable.  * Postprandial vomiting.  EGD showed nothing acute Started on Zofran as needed and patient responded well.  Tolerated diet on day of discharge. Will follow with GI as outpatient for further work-up.  Appointment scheduled.  *Hypertension.  Continue home medications  Stable for discharge home.  CONSULTS OBTAINED:    DRUG ALLERGIES:   Allergies  Allergen Reactions  . Lisinopril Swelling  . Tramadol Other (See Comments)   sleepiness  . Amoxicillin Rash    Don't remember   . Clavulanic Acid Rash    DISCHARGE MEDICATIONS:   Allergies as of 05/31/2019      Reactions   Lisinopril Swelling   Tramadol Other (See Comments)   sleepiness   Amoxicillin Rash   Don't remember    Clavulanic Acid Rash      Medication List    TAKE these medications   acetaminophen 500 MG tablet Commonly known as: TYLENOL Take 1,000 mg by mouth every 6 (six) hours as needed for mild pain or headache.   Advair Diskus 500-50 MCG/DOSE Aepb Generic drug: Fluticasone-Salmeterol Inhale 1 puff into the lungs daily as needed (SOB).   albuterol 108 (90 Base) MCG/ACT inhaler Commonly known as: VENTOLIN HFA Inhale 2 puffs into the lungs every 6 (six) hours as needed for wheezing or shortness of breath.   albuterol 0.63 MG/3ML nebulizer solution Commonly known as: ACCUNEB Take 1 ampule by nebulization every 6 (six) hours as needed for wheezing.   aspirin EC 81 MG tablet Take 81 mg by mouth daily.   atorvastatin 40 MG tablet Commonly known as: LIPITOR Take 80 mg by mouth daily.   citalopram 20 MG tablet Commonly known as: CELEXA Take 20 mg by mouth daily.   fluticasone 50 MCG/ACT nasal spray Commonly known as: FLONASE Place 2 sprays into both nostrils daily.   gabapentin 100 MG capsule Commonly known as: NEURONTIN Take 100  mg by mouth daily.   isosorbide mononitrate 30 MG 24 hr tablet Commonly known as: IMDUR Take 30 mg by mouth daily.   losartan 50 MG tablet Commonly known as: COZAAR Take 50 mg by mouth daily.   metFORMIN 500 MG tablet Commonly known as: GLUCOPHAGE Take 500-1,000 mg by mouth 2 (two) times daily with a meal. Take 1000 mg (2 tablets) by mouth with breakfast and 500 mg (1 tablet) in the evening with supper   metoCLOPramide 5 MG tablet Commonly known as: Reglan Take 1 tablet (5 mg total) by mouth 3 (three) times daily before meals for 30 days.   metoprolol succinate 50 MG 24 hr tablet Commonly  known as: TOPROL-XL Take 50 mg by mouth daily. Take with or immediately following a meal.   nitroGLYCERIN 0.4 MG SL tablet Commonly known as: NITROSTAT Place 1 tablet (0.4 mg total) under the tongue every 5 (five) minutes x 3 doses as needed for chest pain.   pantoprazole 40 MG tablet Commonly known as: PROTONIX Take 40 mg by mouth.   Potassium Chloride ER 20 MEQ Tbcr Take 20 mEq by mouth daily.   sitaGLIPtin 50 MG tablet Commonly known as: JANUVIA Take 50 mg by mouth daily.   ticagrelor 90 MG Tabs tablet Commonly known as: BRILINTA Take 1 tablet (90 mg total) by mouth 2 (two) times daily.   traZODone 50 MG tablet Commonly known as: DESYREL Take 50 mg by mouth at bedtime.   Voltaren 1 % Gel Generic drug: diclofenac sodium Apply 2 g topically as needed (pain in shoulder).       Today   VITAL SIGNS:  Blood pressure (!) 160/91, pulse (!) 56, temperature 98.4 F (36.9 C), temperature source Oral, resp. rate 16, height 5\' 8"  (1.727 m), weight 63.5 kg, SpO2 100 %.  I/O:  No intake or output data in the 24 hours ending 06/11/19 1558  PHYSICAL EXAMINATION:  Physical Exam  GENERAL:  58 y.o.-year-old patient lying in the bed with no acute distress.  LUNGS: Normal breath sounds bilaterally, no wheezing, rales,rhonchi or crepitation. No use of accessory muscles of respiration.  CARDIOVASCULAR: S1, S2 normal. No murmurs, rubs, or gallops.  ABDOMEN: Soft, non-tender, non-distended. Bowel sounds present. No organomegaly or mass.  NEUROLOGIC: Moves all 4 extremities. PSYCHIATRIC: The patient is alert and oriented x 3.  SKIN: No obvious rash, lesion, or ulcer.   DATA REVIEW:   CBC No results for input(s): WBC, HGB, HCT, PLT in the last 168 hours.  Chemistries  No results for input(s): NA, K, CL, CO2, GLUCOSE, BUN, CREATININE, CALCIUM, MG, AST, ALT, ALKPHOS, BILITOT in the last 168 hours.  Invalid input(s): GFRCGP  Cardiac Enzymes No results for input(s): TROPONINI in  the last 168 hours.  Microbiology Results  Results for orders placed or performed during the hospital encounter of 05/29/19  SARS Coronavirus 2 (CEPHEID - Performed in [place] hospital lab), Hosp Order     Status: None   Collection Time: 05/29/19 12:41 PM   Specimen: Nasopharyngeal Swab  Result Value Ref Range Status   SARS Coronavirus 2 NEGATIVE NEGATIVE Final    Comment: (NOTE) If result is NEGATIVE SARS-CoV-2 target nucleic acids are NOT DETECTED. The SARS-CoV-2 RNA is generally detectable in upper and lower  respiratory specimens during the acute phase of infection. The lowest  concentration of SARS-CoV-2 viral copies this assay can detect is 250  copies / mL. A negative result does not preclude SARS-CoV-2 infection  and should not be  used as the sole basis for treatment or other  patient management decisions.  A negative result may occur with  improper specimen collection / handling, submission of specimen other  than nasopharyngeal swab, presence of viral mutation(s) within the  areas targeted by this assay, and inadequate number of viral copies  (<250 copies / mL). A negative result must be combined with clinical  observations, patient history, and epidemiological information. If result is POSITIVE SARS-CoV-2 target nucleic acids are DETECTED. The SARS-CoV-2 RNA is generally detectable in upper and lower  respiratory specimens dur ing the acute phase of infection.  Positive  results are indicative of active infection with SARS-CoV-2.  Clinical  correlation with patient history and other diagnostic information is  necessary to determine patient infection status.  Positive results do  not rule out bacterial infection or co-infection with other viruses. If result is PRESUMPTIVE POSTIVE SARS-CoV-2 nucleic acids MAY BE PRESENT.   A presumptive positive result was obtained on the submitted specimen  and confirmed on repeat testing.  While 2019 novel coronavirus  (SARS-CoV-2)  nucleic acids may be present in the submitted sample  additional confirmatory testing may be necessary for epidemiological  and / or clinical management purposes  to differentiate between  SARS-CoV-2 and other Sarbecovirus currently known to infect humans.  If clinically indicated additional testing with an alternate test  methodology [phone]) is advised. The SARS-CoV-2 RNA is generally  detectable in upper and lower respiratory sp ecimens during the acute  phase of infection. The expected result is Negative. Fact Sheet for Patients:  StrictlyIdeas.no Fact Sheet for Healthcare Providers: BankingDealers.co.za This test is not yet approved or cleared by the [place] FDA and has been authorized for detection and/or diagnosis of SARS-CoV-2 by FDA under an Emergency Use Authorization (EUA).  This EUA will remain in effect (meaning this test can be used) for the duration of the COVID-19 declaration under Section 564(b)(1) of the Act, 21 U.S.C. section 360bbb-3(b)(1), unless the authorization is terminated or revoked sooner. Performed at [facility], [address], [place], [place] [address]     RADIOLOGY:  No results found.  Follow up with PCP in 1 week.  Management plans discussed with the patient, family and they are in agreement.  CODE STATUS:  Code Status History    Date Active Date Inactive Code Status Order ID Comments User Context   05/29/2019 1616 06/01/2019 0323 Full Code [id number]  Demetrios Loll, MD Inpatient   09/24/2018 0140 09/25/2018 1605 Full Code [id number]  Marcie Mowers, MD Inpatient   10/23/2017 1255 10/28/2017 1536 Full Code [id number]  Nani Skillern, PA-C Inpatient   10/20/2017 1144 10/23/2017 1255 Full Code [id number]  Consuelo Pandy, PA-C ED   10/09/2017 1324 10/09/2017 1731 Full Code [id number]  Dionisio [name], MD Inpatient   Advance Care Planning Activity      TOTAL TIME  TAKING CARE OF THIS PATIENT ON DAY OF DISCHARGE: more than 30 minutes.   Leia Alf Lacrisha Bielicki M.D on 06/11/2019 at 3:58 PM  Between 7am to 6pm - Pager - [phone]  After 6pm go to [email or web address] - password [email or web address] [facility]  Office  [phone]  CC: Primary care physician; Perrin Maltese, MD  Note: This dictation was prepared with Dragon dictation along with smaller phrase technology. Any transcriptional errors that result from this process are unintentional.

## 2019-06-20 ENCOUNTER — Telehealth: Payer: Self-pay | Admitting: *Deleted

## 2019-06-20 DIAGNOSIS — Z20822 Contact with and (suspected) exposure to covid-19: Secondary | ICD-10-CM

## 2019-06-20 NOTE — Telephone Encounter (Signed)
Pt called back sch pt at the [facility] [place] location on 7/9 at 10:15

## 2019-06-20 NOTE — Telephone Encounter (Signed)
Ordering: Kayla Co, DNP Phone: [phone]  Exposure- high risk conditions  [phone]  Attempted to call patient- left message to call back for scheduling- her order has been placed.

## 2019-06-21 ENCOUNTER — Other Ambulatory Visit: Payer: Medicare HMO

## 2019-06-21 DIAGNOSIS — Z20822 Contact with and (suspected) exposure to covid-19: Secondary | ICD-10-CM

## 2019-07-15 ENCOUNTER — Other Ambulatory Visit: Payer: Self-pay | Admitting: Family

## 2019-07-15 DIAGNOSIS — Z1231 Encounter for screening mammogram for malignant neoplasm of breast: Secondary | ICD-10-CM

## 2019-09-05 ENCOUNTER — Ambulatory Visit
Admission: RE | Admit: 2019-09-05 | Discharge: 2019-09-05 | Disposition: A | Discharge status: Home / Self Care | Payer: Medicare Other | Source: Ambulatory Visit | Attending: Family | Admitting: Family

## 2019-09-05 DIAGNOSIS — Z1231 Encounter for screening mammogram for malignant neoplasm of breast: Secondary | ICD-10-CM | POA: Diagnosis not present

## 2020-04-08 IMAGING — MG MM DIGITAL SCREENING BILAT W/ CAD
5 series · 5 of 5 positions shown · non-contrast
Comparison: Previous exam(s).

CLINICAL DATA: Screening.

EXAM:
DIGITAL SCREENING BILATERAL MAMMOGRAM WITH CAD

[L CC]
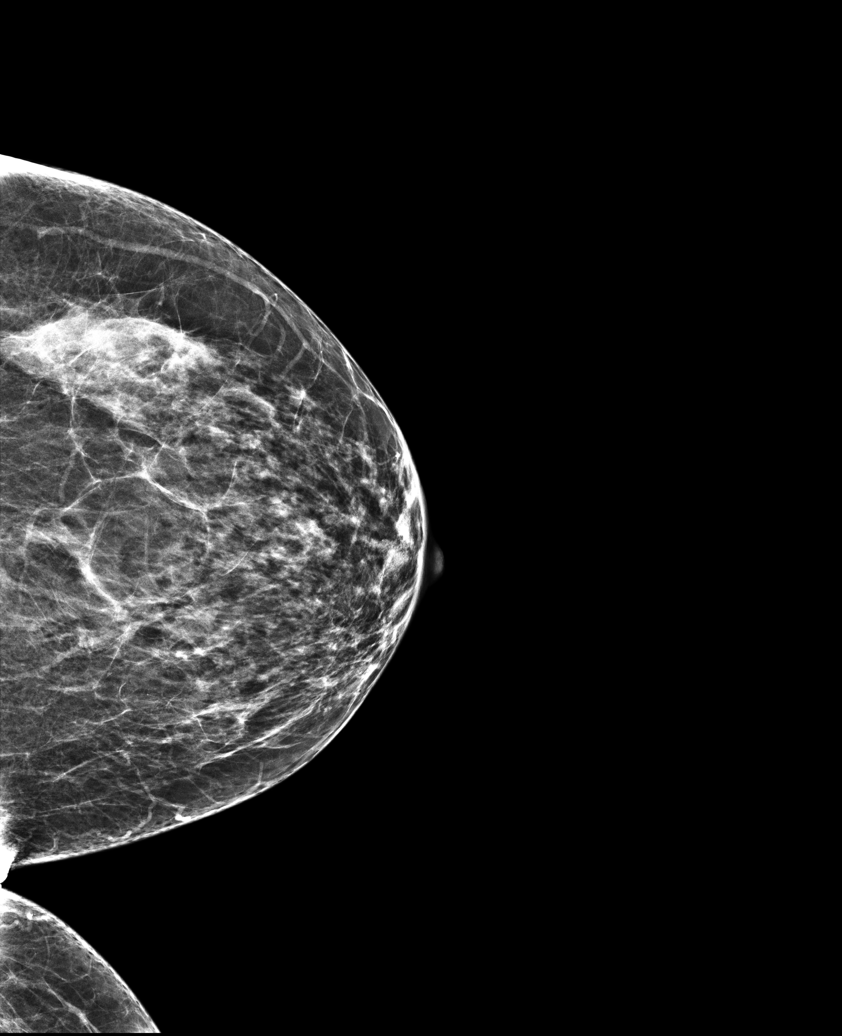

[L MLO (1 of 2)]
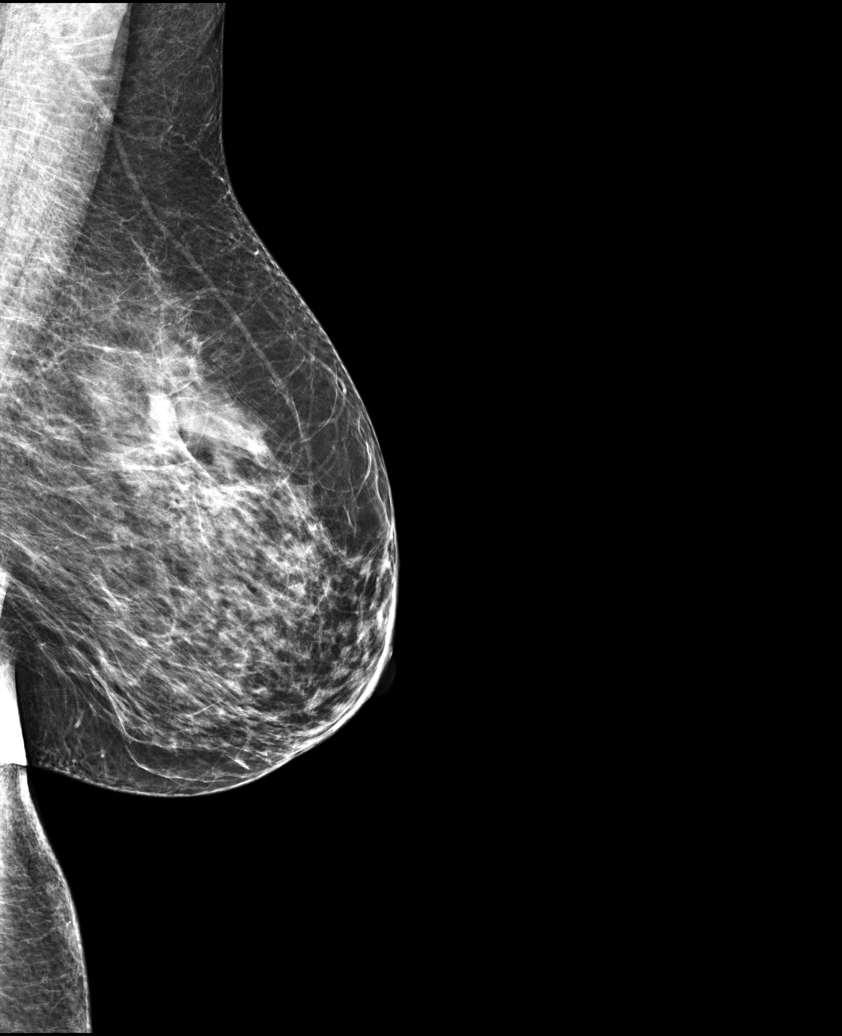

[R CC]
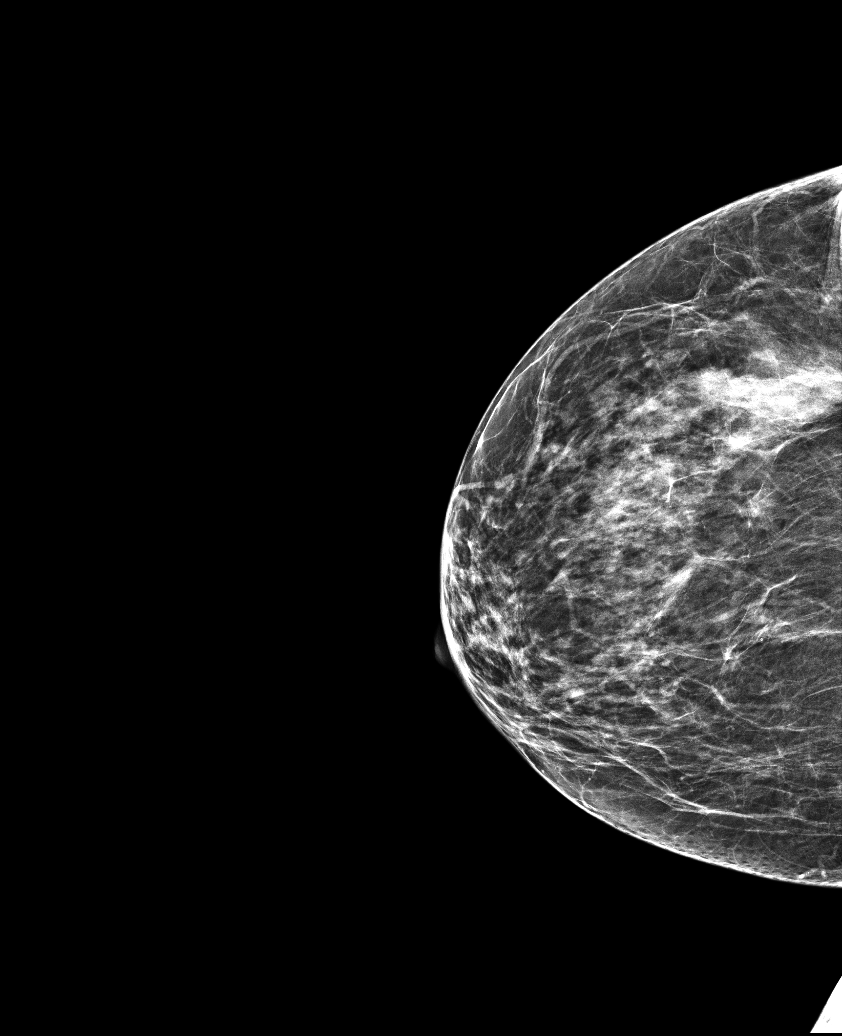

[L MLO (2 of 2)]
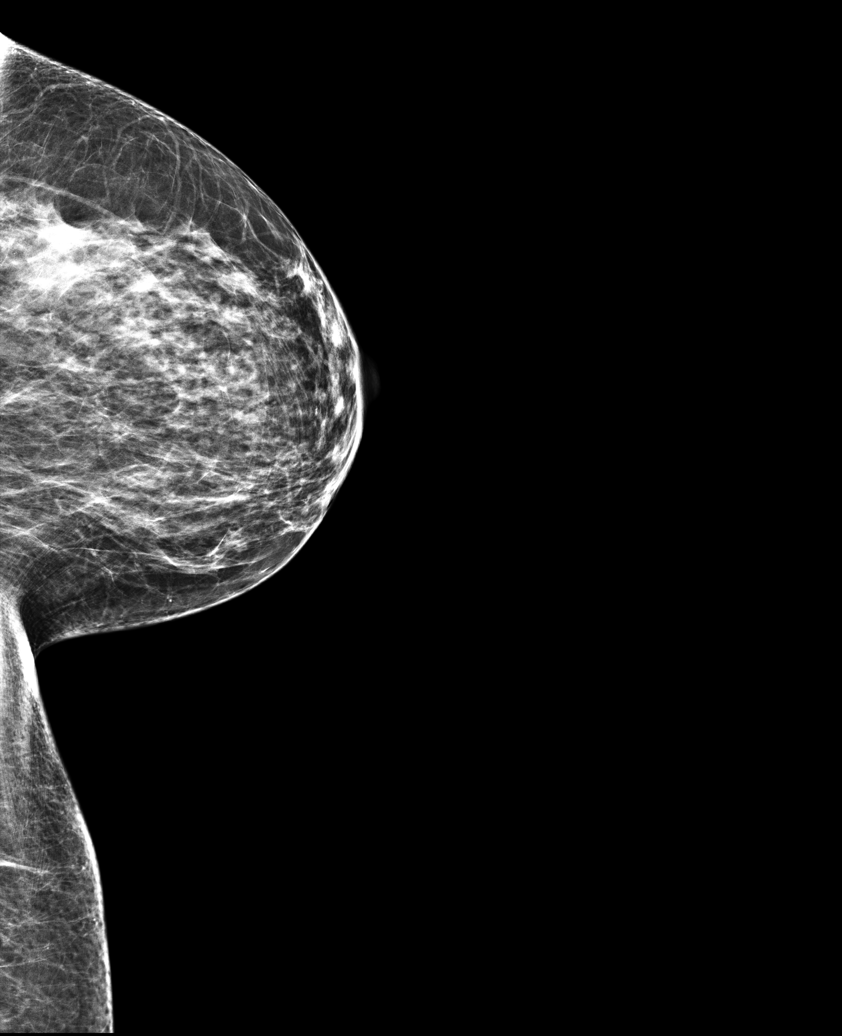

[R MLO]
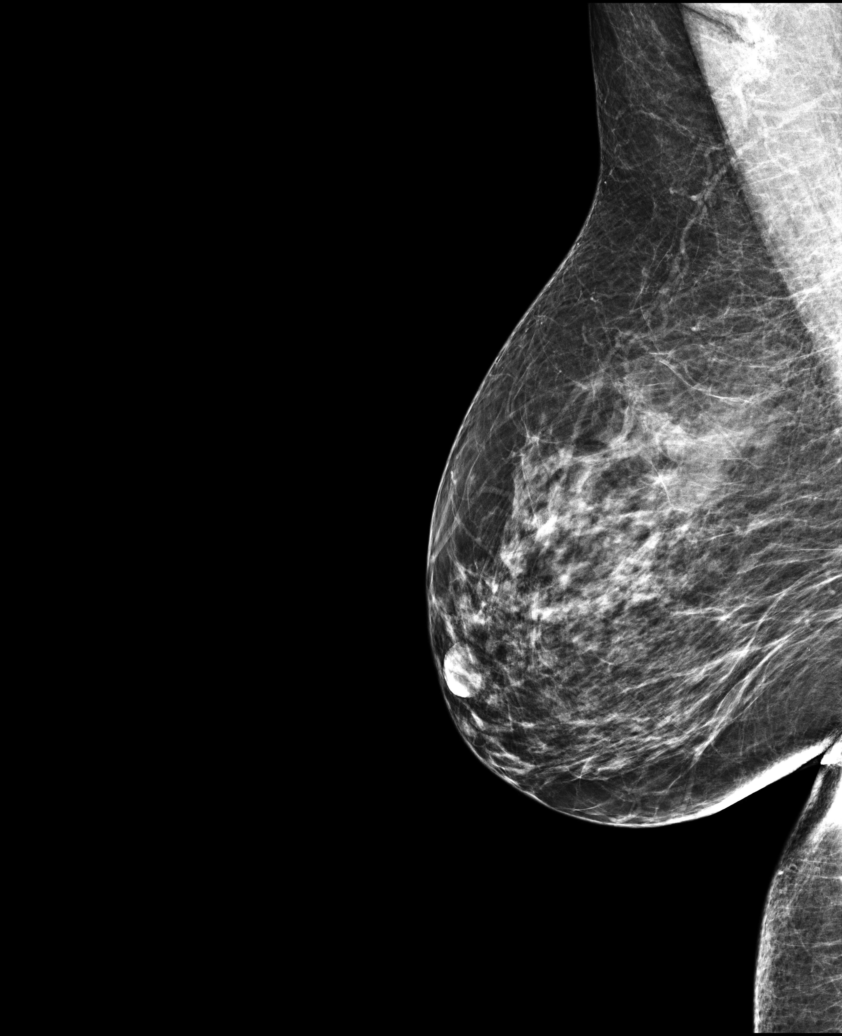

[5 of 5 positions shown; findings below may reference images not displayed]

ACR Breast Density Category c: The breast tissue is heterogeneously
dense, which may obscure small masses.
FINDINGS: There are no findings suspicious for malignancy. Images were
processed with CAD.
IMPRESSION: No mammographic evidence of malignancy. A result letter of this
screening mammogram will be mailed directly to the patient.

RECOMMENDATION:
Screening mammogram in one year. (Code:YJ-[id number])

BI-RADS CATEGORY  1: Negative.

## 2020-06-11 ENCOUNTER — Emergency Department
Admission: EM | Admit: 2020-06-11 | Discharge: 2020-06-11 | Disposition: A | Discharge status: Home / Self Care | Payer: Medicare HMO | Attending: Emergency Medicine | Admitting: Emergency Medicine

## 2020-06-11 ENCOUNTER — Other Ambulatory Visit: Payer: Self-pay

## 2020-06-11 DIAGNOSIS — E119 Type 2 diabetes mellitus without complications: Secondary | ICD-10-CM | POA: Diagnosis not present

## 2020-06-11 DIAGNOSIS — R112 Nausea with vomiting, unspecified: Secondary | ICD-10-CM | POA: Diagnosis present

## 2020-06-11 DIAGNOSIS — I10 Essential (primary) hypertension: Secondary | ICD-10-CM | POA: Diagnosis not present

## 2020-06-11 DIAGNOSIS — J45909 Unspecified asthma, uncomplicated: Secondary | ICD-10-CM | POA: Insufficient documentation

## 2020-06-11 DIAGNOSIS — Z951 Presence of aortocoronary bypass graft: Secondary | ICD-10-CM | POA: Insufficient documentation

## 2020-06-11 DIAGNOSIS — Z8673 Personal history of transient ischemic attack (TIA), and cerebral infarction without residual deficits: Secondary | ICD-10-CM | POA: Insufficient documentation

## 2020-06-11 DIAGNOSIS — I2510 Atherosclerotic heart disease of native coronary artery without angina pectoris: Secondary | ICD-10-CM | POA: Diagnosis not present

## 2020-06-11 DIAGNOSIS — Z87891 Personal history of nicotine dependence: Secondary | ICD-10-CM | POA: Diagnosis not present

## 2020-06-11 LAB — URINALYSIS, COMPLETE (UACMP) WITH MICROSCOPIC
Bacteria, UA: NONE SEEN
Bilirubin Urine: NEGATIVE
Glucose, UA: NEGATIVE mg/dL
Ketones, ur: 15 mg/dL — AB
Nitrite: NEGATIVE
Protein, ur: NEGATIVE mg/dL
Specific Gravity, Urine: 1.01 (ref 1.005–1.030)
pH: 6.5 (ref 5.0–8.0)

## 2020-06-11 LAB — COMPREHENSIVE METABOLIC PANEL
ALT: 16 U/L (ref 0–44)
AST: 24 U/L (ref 15–41)
Albumin: 4.5 g/dL (ref 3.5–5.0)
Alkaline Phosphatase: 76 U/L (ref 38–126)
Anion gap: 11 (ref 5–15)
BUN: 15 mg/dL (ref 6–20)
CO2: 30 mmol/L (ref 22–32)
Calcium: 9.3 mg/dL (ref 8.9–10.3)
Chloride: 93 mmol/L — ABNORMAL LOW (ref 98–111)
Creatinine, Ser: 1.22 mg/dL — ABNORMAL HIGH (ref 0.44–1.00)
GFR calc Af Amer: 56 mL/min — ABNORMAL LOW (ref >60)
GFR calc non Af Amer: 48 mL/min — ABNORMAL LOW (ref >60)
Glucose, Bld: 82 mg/dL (ref 70–99)
Potassium: 3 mmol/L — ABNORMAL LOW (ref 3.5–5.1)
Sodium: 134 mmol/L — ABNORMAL LOW (ref 135–145)
Total Bilirubin: 1.1 mg/dL (ref 0.3–1.2)
Total Protein: 7.8 g/dL (ref 6.5–8.1)

## 2020-06-11 LAB — POCT PREGNANCY, URINE: Preg Test, Ur: NEGATIVE

## 2020-06-11 LAB — CBC
HCT: 37.7 % (ref 36.0–46.0)
Hemoglobin: 13.1 g/dL (ref 12.0–15.0)
MCH: 28.7 pg (ref 26.0–34.0)
MCHC: 34.7 g/dL (ref 30.0–36.0)
MCV: 82.7 fL (ref 80.0–100.0)
Platelets: 326 10*3/uL (ref 150–400)
RBC: 4.56 MIL/uL (ref 3.87–5.11)
RDW: 12.6 % (ref 11.5–15.5)
WBC: 5.6 10*3/uL (ref 4.0–10.5)
nRBC: 0 % (ref 0.0–0.2)

## 2020-06-11 LAB — LIPASE, BLOOD: Lipase: 45 U/L (ref 11–51)

## 2020-06-11 MED ORDER — PROCHLORPERAZINE 25 MG RE SUPP: 25.0000 mg | Freq: Three times a day (TID) | RECTAL | 0 refills | Status: DC | PRN

## 2020-06-11 MED ORDER — SODIUM CHLORIDE 0.9% FLUSH: 3.0000 mL | Freq: Once | INTRAVENOUS | Status: DC

## 2020-06-11 MED ORDER — ONDANSETRON HCL 4 MG/2ML IJ SOLN
4.0000 mg | Freq: Once | INTRAMUSCULAR | Status: AC | PRN
  Administered 2020-06-11: 4 mg via INTRAVENOUS
  Filled 2020-06-11: qty 2

## 2020-06-11 MED ORDER — POTASSIUM CHLORIDE CRYS ER 20 MEQ PO TBCR
40.0000 meq | EXTENDED_RELEASE_TABLET | Freq: Once | ORAL | Status: AC
  Administered 2020-06-11: 40 meq via ORAL
  Filled 2020-06-11: qty 2

## 2020-06-11 NOTE — Discharge Instructions (Addendum)
Please seek medical attention for any high fevers, chest pain, shortness of breath, change in behavior, persistent vomiting, bloody stool or any other new or concerning symptoms.  

## 2020-06-11 NOTE — ED Provider Notes (Signed)
[facility] Emergency Department Provider Note  ____________________________________________   I have reviewed the triage vital signs and the nursing notes.   HISTORY  Chief Complaint Emesis   History limited by: Not Limited   HPI TANEIKA CHOI is a 59 y.o. female who presents to the emergency department today because of concern for worsening nausea, vomiting and decreased oral intake. The patient states that she has been having problems with nausea and vomiting for roughly a year. For the past four days the patient feels like it has gotten worse. She says that she was recently diagnosed with an ulcer and was started on those medications 2 days ago. Feels like the medications she was started on are making the symptoms worse.    Records reviewed. Per medical record review patient has a history of nausea and vomiting.   Past Medical History:  Diagnosis Date  . Asthma   . Coronary artery disease   . Depression   . Diabetes mellitus without complication ([place])   . GERD (gastroesophageal reflux disease)   . History of hiatal hernia   . Hypertension   . Pain    BACK  . Stroke ([place])    x3  . Thyroid cyst     Patient Active Problem List   Diagnosis Date Noted  . Hypokalemia 05/29/2019  . NSTEMI (non-ST elevated myocardial infarction) ([place]) 09/23/2018  . Hx of CABG 10/30/2017  . Coronary artery disease 10/23/2017  . Unstable angina ([place]) 10/20/2017  . Cerebral artery occlusion with cerebral infarction ([place]) 08/06/2017  . Chest pain, unspecified 08/06/2017  . Diabetes ([place]) 08/06/2017  . Hyperlipidemia, unspecified 08/06/2017  . HTN (hypertension) 08/06/2017  . Lumbar radiculopathy 02/15/2017  . Adenomatous colon polyp 02/20/2014  . Benign neoplasm of colon, unspecified 02/20/2014  . Asthma 09/13/2013  . GERD (gastroesophageal reflux disease) 09/13/2013  . CVA, old, hemiparesis ([place]) 09/13/2013    Past Surgical History:  Procedure  Laterality Date  . ABDOMINAL HYSTERECTOMY    . CARDIAC SURGERY    . CATARACT EXTRACTION W/PHACO Left 08/15/2017   Procedure: CATARACT EXTRACTION PHACO AND INTRAOCULAR LENS PLACEMENT (IOC);  Surgeon: Birder Robson, MD;  Location: [facility];  Service: Ophthalmology;  Laterality: Left;  [place] 00:40.6 AP% 7.3 CDE 2.98 FLUID PACK LOT # [id number]  . CORONARY ARTERY BYPASS GRAFT N/A 10/23/2017   Procedure: CORONARY ARTERY BYPASS GRAFTING (CABG) x two  , using left internal mammary artery and right leg greater saphenous vein harvested  endoscopically - LIMA to LAD, SVG to RCA;  Surgeon: Grace Isaac, MD;  Location: [place];  Service: Open Heart Surgery;  Laterality: N/A;  . CORONARY STENT INTERVENTION N/A 09/24/2018   Procedure: CORONARY STENT INTERVENTION;  Surgeon: Jettie Booze, MD;  Location: [place] [facility];  Service: Cardiovascular;  Laterality: N/A;  . ESOPHAGOGASTRODUODENOSCOPY N/A 05/30/2019   Procedure: ESOPHAGOGASTRODUODENOSCOPY (EGD);  Surgeon: Toledo, Benay Pike, MD;  Location: [facility];  Service: Gastroenterology;  Laterality: N/A;  . EYE SURGERY    . LEFT HEART CATH AND CORONARY ANGIOGRAPHY Left 10/09/2017   Procedure: LEFT HEART CATH AND CORONARY ANGIOGRAPHY;  Surgeon: Dionisio [name], MD;  Location: [place] [facility];  Service: Cardiovascular;  Laterality: Left;  . LEFT HEART CATH AND CORS/GRAFTS ANGIOGRAPHY N/A 09/24/2018   Procedure: LEFT HEART CATH AND CORS/GRAFTS ANGIOGRAPHY;  Surgeon: Jettie Booze, MD;  Location: [place] [facility];  Service: Cardiovascular;  Laterality: N/A;  . TEE WITHOUT CARDIOVERSION N/A 10/23/2017   Procedure: TRANSESOPHAGEAL ECHOCARDIOGRAM (  TEE);  Surgeon: Grace Isaac, MD;  Location: [place];  Service: Open Heart Surgery;  Laterality: N/A;    Prior to Admission medications   Medication Sig Start Date End Date Taking? Authorizing Provider  acetaminophen (TYLENOL) 500 MG tablet Take 1,000 mg by mouth every 6 (six) hours as  needed for mild pain or headache.    [provider]  albuterol (ACCUNEB) 0.63 MG/3ML nebulizer solution Take 1 ampule by nebulization every 6 (six) hours as needed for wheezing.    [provider]  albuterol (PROVENTIL HFA;VENTOLIN HFA) 108 (90 Base) MCG/ACT inhaler Inhale 2 puffs into the lungs every 6 (six) hours as needed for wheezing or shortness of breath.     [provider]  aspirin EC 81 MG tablet Take 81 mg by mouth daily.    [provider]  atorvastatin (LIPITOR) 40 MG tablet Take 80 mg by mouth daily.     [provider]  citalopram (CELEXA) 20 MG tablet Take 20 mg by mouth daily. 12/25/18   [provider]  fluticasone (FLONASE) 50 MCG/ACT nasal spray Place 2 sprays into both nostrils daily.    [provider]  Fluticasone-Salmeterol (ADVAIR DISKUS) 500-50 MCG/DOSE AEPB Inhale 1 puff into the lungs daily as needed (SOB).     [provider]  gabapentin (NEURONTIN) 100 MG capsule Take 100 mg by mouth daily. 12/25/18   [provider]  isosorbide mononitrate (IMDUR) 30 MG 24 hr tablet Take 30 mg by mouth daily. 01/31/19   [provider]  losartan (COZAAR) 50 MG tablet Take 50 mg by mouth daily. 06/29/18   [provider]  metFORMIN (GLUCOPHAGE) 500 MG tablet Take 500-1,000 mg by mouth 2 (two) times daily with a meal. Take 1000 mg (2 tablets) by mouth with breakfast and 500 mg (1 tablet) in the evening with supper 02/17/17   [provider]  metoCLOPramide (REGLAN) 5 MG tablet Take 1 tablet (5 mg total) by mouth 3 (three) times daily before meals for 30 days. 05/31/19 06/30/19  Hillary Bow, MD  metoprolol succinate (TOPROL-XL) 50 MG 24 hr tablet Take 50 mg by mouth daily. Take with or immediately following a meal.    [provider]  nitroGLYCERIN (NITROSTAT) 0.4 MG SL tablet Place 1 tablet (0.4 mg total) under the tongue every 5 (five) minutes x 3 doses as needed for chest pain.  09/25/18   Kathyrn Drown D, NP  pantoprazole (PROTONIX) 40 MG tablet Take 40 mg by mouth.     [provider]  potassium chloride 20 MEQ TBCR Take 20 mEq by mouth daily. 05/31/19   Hillary Bow, MD  sitaGLIPtin (JANUVIA) 50 MG tablet Take 50 mg by mouth daily.    [provider]  ticagrelor (BRILINTA) 90 MG TABS tablet Take 1 tablet (90 mg total) by mouth 2 (two) times daily. 09/25/18   Kathyrn Drown D, NP  traZODone (DESYREL) 50 MG tablet Take 50 mg by mouth at bedtime. 02/19/17   [provider]  VOLTAREN 1 % GEL Apply 2 g topically as needed (pain in shoulder).  11/07/17   [provider]    Allergies Lisinopril, Tramadol, Amoxicillin, and Clavulanic acid  Family History  Problem Relation Age of Onset  . Heart failure Mother   . Pulmonary embolism Mother   . Cirrhosis Father   . Coronary artery disease Father   . Diabetes type II Father   . Hypertension Father   . Kidney disease Father   .  Coronary artery disease Sister   . Stroke Sister   . COPD Brother   . Coronary artery disease Brother   . Breast cancer Neg Hx     Social History Social History   Tobacco Use  . Smoking status: Former Smoker    Quit date: 09/13/1998    Years since quitting: 21.7  . Smokeless tobacco: Never Used  Vaping Use  . Vaping Use: Never used  Substance Use Topics  . Alcohol use: No  . Drug use: No    Review of Systems Constitutional: No fever/chills Eyes: No visual changes. ENT: No sore throat. Cardiovascular: Denies chest pain. Respiratory: Denies shortness of breath. Gastrointestinal: Positive for nausea, vomiting and abdominal pain.   Genitourinary: Negative for dysuria. Musculoskeletal: Negative for back pain. Skin: Negative for rash. Neurological: Negative for headaches, focal weakness or numbness.  ____________________________________________   PHYSICAL EXAM:  VITAL SIGNS: ED Triage Vitals  Enc Vitals Group     BP 06/11/20 1846  114/80     Pulse Rate 06/11/20 1846 69     Resp 06/11/20 1846 18     Temp 06/11/20 1846 98.9 F (37.2 C)     Temp Source 06/11/20 1846 Oral     SpO2 06/11/20 1846 100 %     Weight 06/11/20 1844 123 lb (55.8 kg)     Height 06/11/20 1844 5\' 8"  (1.727 m)     Head Circumference --      Peak Flow --      Pain Score 06/11/20 1844 8   Constitutional: Alert and oriented.  Eyes: Conjunctivae are normal.  ENT      Head: Normocephalic and atraumatic.      Nose: No congestion/rhinnorhea.      Mouth/Throat: Mucous membranes are moist.      Neck: No stridor. Hematological/Lymphatic/Immunilogical: No cervical lymphadenopathy. Cardiovascular: Normal rate, regular rhythm.  No murmurs, rubs, or gallops.  Respiratory: Normal respiratory effort without tachypnea nor retractions. Breath sounds are clear and equal bilaterally. No wheezes/rales/rhonchi. Gastrointestinal: Soft and non tender. No rebound. No guarding.  Genitourinary: Deferred Musculoskeletal: Normal range of motion in all extremities. No lower extremity edema. Neurologic:  Normal speech and language. No gross focal neurologic deficits are appreciated.  Skin:  Skin is warm, dry and intact. No rash noted. Psychiatric: Mood and affect are normal. Speech and behavior are normal. Patient exhibits appropriate insight and judgment.  ____________________________________________    LABS (pertinent positives/negatives)  Lipase 45 CBC wbc 5.6, hgb 13.1, plt 326 CMP na 134, k 3.0, cl 93, cr 1.22  ____________________________________________   EKG  None  ____________________________________________    RADIOLOGY  None  ____________________________________________   PROCEDURES  Procedures  ____________________________________________   INITIAL IMPRESSION / ASSESSMENT AND PLAN / ED COURSE  Pertinent labs & imaging results that were available during my care of the patient were reviewed by me and considered in my medical  decision making (see chart for details).   Patient presented to the emergency department today because of concerns for nausea and vomiting.  This is a somewhat chronic problem for the patient although she states it has been worse over the past 4 days.  The patient blood work showed low potassium levels.  Patient was given IV fluids as well as potassium here.  She did feel better after the IV fluids.  She was able to tolerate p.o.  Given the chronic nature of the patient's problem do not feel any emergent imaging is necessary.  Will plan on discharging patient  with prescription for antiemetic suppositories.  Patient states she already has p.o. antiemetics. ____________________________________________   FINAL CLINICAL IMPRESSION(S) / ED DIAGNOSES  Final diagnoses:  Nausea and vomiting, intractability of vomiting not specified, unspecified vomiting type     Note: This dictation was prepared with Dragon dictation. Any transcriptional errors that result from this process are unintentional     Nance Pear, MD 06/11/20 2142

## 2020-06-11 NOTE — ED Notes (Signed)
Potassium tablets crushed and added to applesauce at patients request. Pt received full dose of potassium.

## 2020-06-11 NOTE — ED Triage Notes (Signed)
Pt arrives via POV from home for reports of vomiting x 4 days. Pt states she is unable to keep anything down and states her doctor prescribed her sucralfacte, pantoprazole, flagyl and tetracycline and a stool softener which she started on Tuesday. Pt denies diarrhea or fever. Reports she has thrown up 4 times today

## 2020-07-29 ENCOUNTER — Other Ambulatory Visit: Payer: Self-pay | Admitting: Internal Medicine

## 2020-07-29 DIAGNOSIS — Z1231 Encounter for screening mammogram for malignant neoplasm of breast: Secondary | ICD-10-CM

## 2020-09-07 ENCOUNTER — Other Ambulatory Visit: Payer: Self-pay

## 2020-09-07 ENCOUNTER — Ambulatory Visit
Admission: RE | Admit: 2020-09-07 | Discharge: 2020-09-07 | Disposition: A | Discharge status: Home / Self Care | Payer: Medicare HMO | Source: Ambulatory Visit | Attending: Internal Medicine | Admitting: Internal Medicine

## 2020-09-07 DIAGNOSIS — Z1231 Encounter for screening mammogram for malignant neoplasm of breast: Secondary | ICD-10-CM | POA: Diagnosis not present

## 2020-10-30 ENCOUNTER — Emergency Department: Payer: Medicare HMO

## 2020-10-30 ENCOUNTER — Other Ambulatory Visit: Payer: Self-pay

## 2020-10-30 ENCOUNTER — Encounter: Payer: Self-pay | Admitting: Emergency Medicine

## 2020-10-30 ENCOUNTER — Emergency Department
Admission: EM | Admit: 2020-10-30 | Discharge: 2020-10-30 | Disposition: A | Discharge status: Home / Self Care | Payer: Medicare HMO | Attending: Student in an Organized Health Care Education/Training Program | Admitting: Student in an Organized Health Care Education/Training Program

## 2020-10-30 DIAGNOSIS — E119 Type 2 diabetes mellitus without complications: Secondary | ICD-10-CM | POA: Insufficient documentation

## 2020-10-30 DIAGNOSIS — Z7982 Long term (current) use of aspirin: Secondary | ICD-10-CM | POA: Insufficient documentation

## 2020-10-30 DIAGNOSIS — Z87891 Personal history of nicotine dependence: Secondary | ICD-10-CM | POA: Diagnosis not present

## 2020-10-30 DIAGNOSIS — R1084 Generalized abdominal pain: Secondary | ICD-10-CM | POA: Insufficient documentation

## 2020-10-30 DIAGNOSIS — Z7984 Long term (current) use of oral hypoglycemic drugs: Secondary | ICD-10-CM | POA: Diagnosis not present

## 2020-10-30 DIAGNOSIS — J45909 Unspecified asthma, uncomplicated: Secondary | ICD-10-CM | POA: Insufficient documentation

## 2020-10-30 DIAGNOSIS — Z7951 Long term (current) use of inhaled steroids: Secondary | ICD-10-CM | POA: Insufficient documentation

## 2020-10-30 DIAGNOSIS — Z951 Presence of aortocoronary bypass graft: Secondary | ICD-10-CM | POA: Insufficient documentation

## 2020-10-30 DIAGNOSIS — I2510 Atherosclerotic heart disease of native coronary artery without angina pectoris: Secondary | ICD-10-CM | POA: Diagnosis not present

## 2020-10-30 DIAGNOSIS — K219 Gastro-esophageal reflux disease without esophagitis: Secondary | ICD-10-CM | POA: Insufficient documentation

## 2020-10-30 DIAGNOSIS — Z85038 Personal history of other malignant neoplasm of large intestine: Secondary | ICD-10-CM | POA: Insufficient documentation

## 2020-10-30 DIAGNOSIS — I10 Essential (primary) hypertension: Secondary | ICD-10-CM | POA: Diagnosis not present

## 2020-10-30 DIAGNOSIS — Z79899 Other long term (current) drug therapy: Secondary | ICD-10-CM | POA: Insufficient documentation

## 2020-10-30 LAB — CBC
HCT: 32.7 % — ABNORMAL LOW (ref 36.0–46.0)
Hemoglobin: 10.7 g/dL — ABNORMAL LOW (ref 12.0–15.0)
MCH: 28.7 pg (ref 26.0–34.0)
MCHC: 32.7 g/dL (ref 30.0–36.0)
MCV: 87.7 fL (ref 80.0–100.0)
Platelets: 316 10*3/uL (ref 150–400)
RBC: 3.73 MIL/uL — ABNORMAL LOW (ref 3.87–5.11)
RDW: 13.3 % (ref 11.5–15.5)
WBC: 4.9 10*3/uL (ref 4.0–10.5)
nRBC: 0 % (ref 0.0–0.2)

## 2020-10-30 LAB — COMPREHENSIVE METABOLIC PANEL
ALT: 11 U/L (ref 0–44)
AST: 18 U/L (ref 15–41)
Albumin: 3.7 g/dL (ref 3.5–5.0)
Alkaline Phosphatase: 63 U/L (ref 38–126)
Anion gap: 10 (ref 5–15)
BUN: 21 mg/dL — ABNORMAL HIGH (ref 6–20)
CO2: 28 mmol/L (ref 22–32)
Calcium: 9.2 mg/dL (ref 8.9–10.3)
Chloride: 101 mmol/L (ref 98–111)
Creatinine, Ser: 1.02 mg/dL — ABNORMAL HIGH (ref 0.44–1.00)
GFR, Estimated: >60 mL/min (ref >60)
Glucose, Bld: 131 mg/dL — ABNORMAL HIGH (ref 70–99)
Potassium: 4 mmol/L (ref 3.5–5.1)
Sodium: 139 mmol/L (ref 135–145)
Total Bilirubin: 0.6 mg/dL (ref 0.3–1.2)
Total Protein: 6.9 g/dL (ref 6.5–8.1)

## 2020-10-30 LAB — URINALYSIS, COMPLETE (UACMP) WITH MICROSCOPIC
Bilirubin Urine: NEGATIVE
Glucose, UA: NEGATIVE mg/dL
Hgb urine dipstick: NEGATIVE
Ketones, ur: NEGATIVE mg/dL
Nitrite: NEGATIVE
Protein, ur: NEGATIVE mg/dL
Specific Gravity, Urine: 1.015 (ref 1.005–1.030)
pH: 6 (ref 5.0–8.0)

## 2020-10-30 LAB — TROPONIN I (HIGH SENSITIVITY)
Troponin I (High Sensitivity): 11 ng/L (ref <18)
Troponin I (High Sensitivity): 12 ng/L (ref <18)

## 2020-10-30 LAB — LIPASE, BLOOD: Lipase: 74 U/L — ABNORMAL HIGH (ref 11–51)

## 2020-10-30 MED ORDER — SODIUM CHLORIDE 0.9 % IV BOLUS
500.0000 mL | Freq: Once | INTRAVENOUS | Status: AC
  Administered 2020-10-30: 500 mL via INTRAVENOUS

## 2020-10-30 MED ORDER — ONDANSETRON HCL 4 MG/2ML IJ SOLN
4.0000 mg | Freq: Once | INTRAMUSCULAR | Status: AC
  Administered 2020-10-30: 4 mg via INTRAVENOUS
  Filled 2020-10-30: qty 2

## 2020-10-30 MED ORDER — HYDROMORPHONE HCL 1 MG/ML IJ SOLN
0.5000 mg | INTRAMUSCULAR | Status: DC | PRN
  Administered 2020-10-30: 0.5 mg via INTRAVENOUS
  Filled 2020-10-30: qty 1

## 2020-10-30 MED ORDER — IOHEXOL 300 MG/ML  SOLN
100.0000 mL | Freq: Once | INTRAMUSCULAR | Status: AC | PRN
  Administered 2020-10-30: 100 mL via INTRAVENOUS

## 2020-10-30 MED ORDER — SUCRALFATE 1 GM/10ML PO SUSP: 1.0000 g | Freq: Three times a day (TID) | ORAL | 0 refills | Status: DC | PRN

## 2020-10-30 MED ORDER — MORPHINE SULFATE (PF) 4 MG/ML IV SOLN
4.0000 mg | INTRAVENOUS | Status: DC | PRN
  Administered 2020-10-30: 4 mg via INTRAVENOUS
  Filled 2020-10-30: qty 1

## 2020-10-30 NOTE — Discharge Instructions (Signed)

## 2020-10-30 NOTE — ED Provider Notes (Signed)
[facility] Emergency Department Provider Note    First MD Initiated Contact with Patient 10/30/20 0725     (approximate)  I have reviewed the triage vital signs and the nursing notes.   HISTORY  Chief Complaint Abdominal Pain    HPI Kayla Malone is a 59 y.o. female states a past medical history as listed below presents to the ER for evaluation of generalized abdominal pain that occurred suddenly when she rolled over this morning at home.  Describes the pain is moderate to severe stabbing in nature no radiation through to her back or in her groin.  No previous abdominal surgeries.  She is status post triple bypass surgery.  Denies any chest pain or pressure.  No shortness of breath.  No measured fevers.    Past Medical History:  Diagnosis Date  . Asthma   . Coronary artery disease   . Depression   . Diabetes mellitus without complication ([place])   . GERD (gastroesophageal reflux disease)   . History of hiatal hernia   . Hypertension   . Pain    BACK  . Stroke ([place])    x3  . Thyroid cyst    Family History  Problem Relation Age of Onset  . Heart failure Mother   . Pulmonary embolism Mother   . Cirrhosis Father   . Coronary artery disease Father   . Diabetes type II Father   . Hypertension Father   . Kidney disease Father   . Coronary artery disease Sister   . Stroke Sister   . COPD Brother   . Coronary artery disease Brother   . Breast cancer Neg Hx    Past Surgical History:  Procedure Laterality Date  . ABDOMINAL HYSTERECTOMY    . CARDIAC SURGERY    . CATARACT EXTRACTION W/PHACO Left 08/15/2017   Procedure: CATARACT EXTRACTION PHACO AND INTRAOCULAR LENS PLACEMENT (IOC);  Surgeon: Birder Robson, MD;  Location: [facility];  Service: Ophthalmology;  Laterality: Left;  [place] 00:40.6 AP% 7.3 CDE 2.98 FLUID PACK LOT # [id number]  . CORONARY ARTERY BYPASS GRAFT N/A 10/23/2017   Procedure: CORONARY ARTERY BYPASS GRAFTING (CABG) x two  ,  using left internal mammary artery and right leg greater saphenous vein harvested  endoscopically - LIMA to LAD, SVG to RCA;  Surgeon: Grace Isaac, MD;  Location: [place];  Service: Open Heart Surgery;  Laterality: N/A;  . CORONARY STENT INTERVENTION N/A 09/24/2018   Procedure: CORONARY STENT INTERVENTION;  Surgeon: Jettie Booze, MD;  Location: [place] [facility];  Service: Cardiovascular;  Laterality: N/A;  . ESOPHAGOGASTRODUODENOSCOPY N/A 05/30/2019   Procedure: ESOPHAGOGASTRODUODENOSCOPY (EGD);  Surgeon: Toledo, Benay Pike, MD;  Location: [facility];  Service: Gastroenterology;  Laterality: N/A;  . EYE SURGERY    . LEFT HEART CATH AND CORONARY ANGIOGRAPHY Left 10/09/2017   Procedure: LEFT HEART CATH AND CORONARY ANGIOGRAPHY;  Surgeon: Dionisio [name], MD;  Location: [place] [facility];  Service: Cardiovascular;  Laterality: Left;  . LEFT HEART CATH AND CORS/GRAFTS ANGIOGRAPHY N/A 09/24/2018   Procedure: LEFT HEART CATH AND CORS/GRAFTS ANGIOGRAPHY;  Surgeon: Jettie Booze, MD;  Location: [place] [facility];  Service: Cardiovascular;  Laterality: N/A;  . TEE WITHOUT CARDIOVERSION N/A 10/23/2017   Procedure: TRANSESOPHAGEAL ECHOCARDIOGRAM (TEE);  Surgeon: Grace Isaac, MD;  Location: [place];  Service: Open Heart Surgery;  Laterality: N/A;   Patient Active Problem List   Diagnosis Date Noted  . Hypokalemia 05/29/2019  . NSTEMI (  non-ST elevated myocardial infarction) ([place]) 09/23/2018  . Hx of CABG 10/30/2017  . Coronary artery disease 10/23/2017  . Unstable angina ([place]) 10/20/2017  . Cerebral artery occlusion with cerebral infarction ([place]) 08/06/2017  . Chest pain, unspecified 08/06/2017  . Diabetes ([place]) 08/06/2017  . Hyperlipidemia, unspecified 08/06/2017  . HTN (hypertension) 08/06/2017  . Lumbar radiculopathy 02/15/2017  . Adenomatous colon polyp 02/20/2014  . Benign neoplasm of colon, unspecified 02/20/2014  . Asthma 09/13/2013  . GERD (gastroesophageal  reflux disease) 09/13/2013  . CVA, old, hemiparesis ([place]) 09/13/2013      Prior to Admission medications   Medication Sig Start Date End Date Taking? Authorizing Provider  acetaminophen (TYLENOL) 500 MG tablet Take 1,000 mg by mouth every 6 (six) hours as needed for mild pain or headache.    [provider]  albuterol (ACCUNEB) 0.63 MG/3ML nebulizer solution Take 1 ampule by nebulization every 6 (six) hours as needed for wheezing.    [provider]  albuterol (PROVENTIL HFA;VENTOLIN HFA) 108 (90 Base) MCG/ACT inhaler Inhale 2 puffs into the lungs every 6 (six) hours as needed for wheezing or shortness of breath.     [provider]  aspirin EC 81 MG tablet Take 81 mg by mouth daily.    [provider]  atorvastatin (LIPITOR) 40 MG tablet Take 80 mg by mouth daily.     [provider]  citalopram (CELEXA) 20 MG tablet Take 20 mg by mouth daily. 12/25/18   [provider]  fluticasone (FLONASE) 50 MCG/ACT nasal spray Place 2 sprays into both nostrils daily.    [provider]  Fluticasone-Salmeterol (ADVAIR DISKUS) 500-50 MCG/DOSE AEPB Inhale 1 puff into the lungs daily as needed (SOB).     [provider]  gabapentin (NEURONTIN) 100 MG capsule Take 100 mg by mouth daily. 12/25/18   [provider]  isosorbide mononitrate (IMDUR) 30 MG 24 hr tablet Take 30 mg by mouth daily. 01/31/19   [provider]  losartan (COZAAR) 50 MG tablet Take 50 mg by mouth daily. 06/29/18   [provider]  metFORMIN (GLUCOPHAGE) 500 MG tablet Take 500-1,000 mg by mouth 2 (two) times daily with a meal. Take 1000 mg (2 tablets) by mouth with breakfast and 500 mg (1 tablet) in the evening with supper 02/17/17   [provider]  metoCLOPramide (REGLAN) 5 MG tablet Take 1 tablet (5 mg total) by mouth 3 (three) times daily before meals for 30 days. 05/31/19 06/30/19  Hillary Bow, MD  metoprolol succinate (TOPROL-XL) 50  MG 24 hr tablet Take 50 mg by mouth daily. Take with or immediately following a meal.    [provider]  nitroGLYCERIN (NITROSTAT) 0.4 MG SL tablet Place 1 tablet (0.4 mg total) under the tongue every 5 (five) minutes x 3 doses as needed for chest pain. 09/25/18   Kathyrn Drown D, NP  pantoprazole (PROTONIX) 40 MG tablet Take 40 mg by mouth.     [provider]  potassium chloride 20 MEQ TBCR Take 20 mEq by mouth daily. 05/31/19   Hillary Bow, MD  prochlorperazine (COMPAZINE) 25 MG suppository Place 1 suppository (25 mg total) rectally every 8 (eight) hours as needed for nausea or vomiting. 06/11/20   Nance Pear, MD  sitaGLIPtin (JANUVIA) 50 MG tablet Take 50 mg by mouth daily.    [provider]  ticagrelor (BRILINTA) 90 MG TABS tablet Take 1 tablet (90 mg total) by mouth 2 (two) times daily. 09/25/18   Glenford Peers,  Alphonsa Overall, NP  traZODone (DESYREL) 50 MG tablet Take 50 mg by mouth at bedtime. 02/19/17   [provider]  VOLTAREN 1 % GEL Apply 2 g topically as needed (pain in shoulder).  11/07/17   [provider]    Allergies Lisinopril, Tramadol, Amoxicillin, and Clavulanic acid    Social History Social History   Tobacco Use  . Smoking status: Former Smoker    Quit date: 09/13/1998    Years since quitting: 22.1  . Smokeless tobacco: Never Used  Vaping Use  . Vaping Use: Never used  Substance Use Topics  . Alcohol use: No  . Drug use: No    Review of Systems Patient denies headaches, rhinorrhea, blurry vision, numbness, shortness of breath, chest pain, edema, cough, abdominal pain, nausea, vomiting, diarrhea, dysuria, fevers, rashes or hallucinations unless otherwise stated above in HPI. ____________________________________________   PHYSICAL EXAM:  VITAL SIGNS: Vitals:   10/30/20 0559  BP: 132/68  Pulse: (!) 59  Resp: 20  Temp: (!) 97.5 F (36.4 C)  SpO2: 100%    Constitutional: Alert and oriented. Uncomfortable  appearing Eyes: Conjunctivae are normal.  Head: Atraumatic. Nose: No congestion/rhinnorhea. Mouth/Throat: Mucous membranes are moist.   Neck: No stridor. Painless ROM.  Cardiovascular: Normal rate, regular rhythm. Grossly normal heart sounds.  Good peripheral circulation. Respiratory: Normal respiratory effort.  No retractions. Lungs CTAB. Gastrointestinal: Soft with diffuse tenderness. No distention. No abdominal bruits. No CVA tenderness. Genitourinary:  Musculoskeletal: No lower extremity tenderness nor edema.  No joint effusions. Neurologic:  Normal speech and language. No gross focal neurologic deficits are appreciated. No facial droop Skin:  Skin is warm, dry and intact. No rash noted. Psychiatric: Mood and affect are normal. Speech and behavior are normal.  ____________________________________________   LABS (all labs ordered are listed, but only abnormal results are displayed)  Results for orders placed or performed during the hospital encounter of 10/30/20 (from the past 24 hour(s))  Lipase, blood     Status: Abnormal   Collection Time: 10/30/20  6:03 AM  Result Value Ref Range   Lipase 74 (H) 11 - 51 U/L  Comprehensive metabolic panel     Status: Abnormal   Collection Time: 10/30/20  6:03 AM  Result Value Ref Range   Sodium 139 135 - 145 mmol/L   Potassium 4.0 3.5 - 5.1 mmol/L   Chloride 101 98 - 111 mmol/L   CO2 28 22 - 32 mmol/L   Glucose, Bld 131 (H) 70 - 99 mg/dL   BUN 21 (H) 6 - 20 mg/dL   Creatinine, Ser 1.02 (H) 0.44 - 1.00 mg/dL   Calcium 9.2 8.9 - 10.3 mg/dL   Total Protein 6.9 6.5 - 8.1 g/dL   Albumin 3.7 3.5 - 5.0 g/dL   AST 18 15 - 41 U/L   ALT 11 0 - 44 U/L   Alkaline Phosphatase 63 38 - 126 U/L   Total Bilirubin 0.6 0.3 - 1.2 mg/dL   GFR, Estimated >60 >60 mL/min   Anion gap 10 5 - 15  CBC     Status: Abnormal   Collection Time: 10/30/20  6:03 AM  Result Value Ref Range   WBC 4.9 4.0 - 10.5 K/uL   RBC 3.73 (L) 3.87 - 5.11 MIL/uL   Hemoglobin  10.7 (L) 12.0 - 15.0 g/dL   HCT 32.7 (L) 36 - 46 %   MCV 87.7 80.0 - 100.0 fL   MCH 28.7 26.0 - 34.0 pg   MCHC 32.7  30.0 - 36.0 g/dL   RDW 13.3 11.5 - 15.5 %   Platelets 316 150 - 400 K/uL   nRBC 0.0 0.0 - 0.2 %   ____________________________________________  EKG My review and personal interpretation at Time: 9:04   Indication: abd pain  Rate: 60  Rhythm: sinus Axis: normal Other: normal intervals, no stemi ____________________________________________  RADIOLOGY  I personally reviewed all radiographic images ordered to evaluate for the above acute complaints and reviewed radiology reports and findings.  These findings were personally discussed with the patient.  Please see medical record for radiology report.  ____________________________________________   PROCEDURES  Procedure(s) performed:  Procedures    Critical Care performed: no ____________________________________________   INITIAL IMPRESSION / ASSESSMENT AND PLAN / ED COURSE  Pertinent labs & imaging results that were available during my care of the patient were reviewed by me and considered in my medical decision making (see chart for details).   DDX: Enteritis, SBO, diverticulitis, perforation, pancreatitis, cholecystitis, cholelithiasis, gastritis, appendicitis, UTI, stone  IFEOLUWA BARTZ is a 59 y.o. who presents to the ED with presentation as described above.  Patient is uncomfortable appearing.  Afebrile.  No guarding or rebound on exam.  Given her discomfort will check blood work give IV narcotic medication as well as order CT imaging to evaluate for the above differential.  The patient will be placed on continuous pulse oximetry and telemetry for monitoring.  Laboratory evaluation will be sent to evaluate for the above complaints.     Clinical Course as of Oct 30 1200  Fri Oct 30, 2020  1159 Patient reassessed.  Repeat abdominal exam is soft benign.  CT imaging and blood work-up has been reassuring.   No signs of dysrhythmia on the monitor.  Not consistent with ACS.  Lipase is borderline elevated but no findings to suggest acute pancreatitis.  At this point I do believe she stable and appropriate for outpatient follow-up as she has outpatient GI follow-up beginning of next week.  We discussed signs and symptoms which she should return to the ER or seek medical attention.  Have discussed with the patient and available family all diagnostics and treatments performed thus far and all questions were answered to the best of my ability. The patient demonstrates understanding and agreement with plan.    [PR]    Clinical Course User Index [PR] Merlyn Lot, MD    The patient was evaluated in Emergency Department today for the symptoms described in the history of present illness. He/she was evaluated in the context of the global COVID-19 pandemic, which necessitated consideration that the patient might be at risk for infection with the SARS-CoV-2 virus that causes COVID-19. Institutional protocols and algorithms that pertain to the evaluation of patients at risk for COVID-19 are in a state of rapid change based on information released by regulatory bodies including the CDC and federal and state organizations. These policies and algorithms were followed during the patient's care in the ED.  As part of my medical decision making, I reviewed the following data within the [place] notes reviewed and incorporated, Labs reviewed, notes from prior ED visits and [place] Controlled Substance Database   ____________________________________________   FINAL CLINICAL IMPRESSION(S) / ED DIAGNOSES  Final diagnoses:  Generalized abdominal pain      NEW MEDICATIONS STARTED DURING THIS VISIT:  New Prescriptions   No medications on file     Note:  This document was prepared using Dragon voice recognition software and may  include unintentional dictation errors.    Merlyn Lot, MD 10/30/20 [phone]

## 2020-10-30 NOTE — ED Notes (Signed)
Pt to CT. Pt in NAD at this time. VSS.

## 2020-10-30 NOTE — ED Notes (Signed)
Pt states pain has improved. Pt resting quietly with eyes closed and appears to be in NAD at this time. VSS. Awaiting further orders. Will continue to monitor.

## 2020-10-30 NOTE — ED Triage Notes (Signed)
Pt arrived via POV with reports of lower abdominal pain that started this morning, pt states the pain just "hurts" and feels like "something is twisted in there"  Pt denies any N/V/D.

## 2020-10-30 NOTE — ED Notes (Signed)
MD at bedside. 

## 2021-03-01 ENCOUNTER — Other Ambulatory Visit: Payer: Self-pay

## 2021-03-01 ENCOUNTER — Encounter (INDEPENDENT_AMBULATORY_CARE_PROVIDER_SITE_OTHER): Payer: Self-pay | Admitting: Vascular Surgery

## 2021-03-01 ENCOUNTER — Ambulatory Visit (INDEPENDENT_AMBULATORY_CARE_PROVIDER_SITE_OTHER): Payer: Medicare HMO | Admitting: Vascular Surgery

## 2021-03-01 VITALS — BP 99/62 | HR 64 | Ht 64.0 in | Wt 125.0 lb

## 2021-03-01 DIAGNOSIS — I10 Essential (primary) hypertension: Secondary | ICD-10-CM

## 2021-03-01 DIAGNOSIS — I25118 Atherosclerotic heart disease of native coronary artery with other forms of angina pectoris: Secondary | ICD-10-CM

## 2021-03-01 DIAGNOSIS — I890 Lymphedema, not elsewhere classified: Secondary | ICD-10-CM | POA: Diagnosis not present

## 2021-03-01 DIAGNOSIS — J45909 Unspecified asthma, uncomplicated: Secondary | ICD-10-CM | POA: Diagnosis not present

## 2021-03-01 DIAGNOSIS — M5416 Radiculopathy, lumbar region: Secondary | ICD-10-CM | POA: Diagnosis not present

## 2021-03-01 DIAGNOSIS — E782 Mixed hyperlipidemia: Secondary | ICD-10-CM

## 2021-03-04 ENCOUNTER — Encounter (INDEPENDENT_AMBULATORY_CARE_PROVIDER_SITE_OTHER): Payer: Self-pay | Admitting: Vascular Surgery

## 2021-03-04 DIAGNOSIS — I890 Lymphedema, not elsewhere classified: Secondary | ICD-10-CM | POA: Insufficient documentation

## 2021-03-04 NOTE — Progress Notes (Signed)
MRN : [id number]  Kayla Malone is a 60 y.o. (06/09/1961) female who presents with chief complaint of  Chief Complaint  Patient presents with  . New Patient (Initial Visit)    [place]. Chronic venous insuff  .  History of Present Illness:   Patient is seen for evaluation of leg swelling. The patient first noticed the swelling remotely but is now concerned because of a significant increase in the overall edema. The swelling is associated with pain and discoloration. The patient notes that in the morning the legs are significantly improved but they steadily worsened throughout the course of the day. Elevation makes the legs better, dependency makes them much worse.   There is no history of ulcerations associated with the swelling.   The patient denies any recent changes in their medications.  The patient has not been wearing graduated compression.  The patient has no had any past angiography, interventions or vascular surgery.  The patient denies a history of DVT or PE. There is no prior history of phlebitis. There is no history of primary lymphedema.  There is no history of radiation treatment to the groin or pelvis No history of malignancies. No history of trauma or groin or pelvic surgery. No history of foreign travel or parasitic infections area    Current Meds  Medication Sig  . Accu-Chek Softclix Lancets lancets   . acetaminophen (TYLENOL) 500 MG tablet Take 1,000 mg by mouth every 6 (six) hours as needed for mild pain or headache.  Marland Kitchen amLODipine (NORVASC) 5 MG tablet Take by mouth.  Marland Kitchen aspirin EC 81 MG tablet Take 81 mg by mouth daily.  Marland Kitchen atorvastatin (LIPITOR) 40 MG tablet Take 80 mg by mouth daily.   . Bempedoic Acid-Ezetimibe (NEXLIZET) 180-10 MG TABS Take 1 tablet by mouth daily.  Marland Kitchen bismuth subsalicylate (PEPTO BISMOL) 262 MG chewable tablet Bismuth Subsalicylate 572 MG Oral Tablet Chewable QTY: 112 tablet Days: 14 Refills: 0  Written: 06/05/20 Patient  Instructions: 2 four times a day  . Blood Glucose Monitoring Suppl (ACCU-CHEK GUIDE ME) w/Device KIT   . chlorthalidone (HYGROTON) 25 MG tablet Chlorthalidone 25 MG Oral Tablet QTY: 90 tablet Days: 90 Refills: 2  Written: 04/09/20 Patient Instructions: once a day  . citalopram (CELEXA) 20 MG tablet Take 20 mg by mouth daily.  . clopidogrel (PLAVIX) 75 MG tablet Take by mouth.  . fluticasone (FLONASE) 50 MCG/ACT nasal spray Place 2 sprays into both nostrils daily.  . Fluticasone-Salmeterol (ADVAIR) 500-50 MCG/DOSE AEPB Inhale 1 puff into the lungs daily as needed (SOB).   Marland Kitchen gabapentin (NEURONTIN) 100 MG capsule Take 100 mg by mouth daily.  Marland Kitchen glucose blood (ACCU-CHEK GUIDE) test strip Blood Glucose Test In Vitro Strip QTY: 100 strip Days: 90 Refills: 3  Written: 06/07/19 Patient Instructions: E11.9 - to check blodd sugars once daily  . isosorbide mononitrate (IMDUR) 30 MG 24 hr tablet Take 30 mg by mouth daily.  Marland Kitchen linezolid (ZYVOX) 600 MG tablet Take by mouth.  . losartan (COZAAR) 50 MG tablet Take 50 mg by mouth daily.  . meloxicam (MOBIC) 15 MG tablet Take by mouth.  . metFORMIN (GLUCOPHAGE) 500 MG tablet Take 500-1,000 mg by mouth 2 (two) times daily with a meal. Take 1000 mg (2 tablets) by mouth with breakfast and 500 mg (1 tablet) in the evening with supper  . metoprolol succinate (TOPROL-XL) 50 MG 24 hr tablet Take 50 mg by mouth daily. Take with or immediately following a meal.  .  Multiple Minerals-Vitamins (CITRACAL PLUS) TABS Magnesium 250 MG Oral Tablet QTY: 0 tablet Days: 0 Refills: 0  Written: 08/15/19 Patient Instructions:  . nitroGLYCERIN (NITROSTAT) 0.4 MG SL tablet Place 1 tablet (0.4 mg total) under the tongue every 5 (five) minutes x 3 doses as needed for chest pain.  . pantoprazole (PROTONIX) 40 MG tablet Take 40 mg by mouth.   . potassium chloride 20 MEQ TBCR Take 20 mEq by mouth daily.  . prochlorperazine (COMPAZINE) 25 MG suppository Place 1 suppository (25 mg total) rectally  every 8 (eight) hours as needed for nausea or vomiting.  . promethazine (PHENERGAN) 12.5 MG tablet   . RYBELSUS 7 MG TABS   . sitaGLIPtin (JANUVIA) 50 MG tablet Take 50 mg by mouth daily.  . sucralfate (CARAFATE) 1 GM/10ML suspension Take 10 mLs (1 g total) by mouth 3 (three) times daily as needed.  Marland Kitchen tetracycline (SUMYCIN) 500 MG capsule Tetracycline HCl 500 MG Oral Capsule QTY: 56 capsule Days: 14 Refills: 0  Written: 06/05/20 Patient Instructions: four times a day  . ticagrelor (BRILINTA) 90 MG TABS tablet Take 1 tablet (90 mg total) by mouth 2 (two) times daily.  . traZODone (DESYREL) 50 MG tablet Take 50 mg by mouth at bedtime.  . VOLTAREN 1 % GEL Apply 2 g topically as needed (pain in shoulder).   . [DISCONTINUED] albuterol (ACCUNEB) 0.63 MG/3ML nebulizer solution Take 1 ampule by nebulization every 6 (six) hours as needed for wheezing.  . [DISCONTINUED] albuterol (PROVENTIL HFA;VENTOLIN HFA) 108 (90 Base) MCG/ACT inhaler Inhale 2 puffs into the lungs every 6 (six) hours as needed for wheezing or shortness of breath.   . [DISCONTINUED] budesonide-formoterol (SYMBICORT) 160-4.5 MCG/ACT inhaler Symbicort 160-4.5 MCG/ACT Inhalation Aerosol QTY: 10.2 gram Days: 30 Refills: 6  Written: 04/08/20 Patient Instructions: inhale 1 puff twice a day    Past Medical History:  Diagnosis Date  . Asthma   . Coronary artery disease   . Depression   . Diabetes mellitus without complication ([place])   . GERD (gastroesophageal reflux disease)   . History of hiatal hernia   . Hypertension   . Pain    BACK  . Stroke ([place])    x3  . Thyroid cyst     Past Surgical History:  Procedure Laterality Date  . ABDOMINAL HYSTERECTOMY    . CARDIAC SURGERY    . CATARACT EXTRACTION W/PHACO Left 08/15/2017   Procedure: CATARACT EXTRACTION PHACO AND INTRAOCULAR LENS PLACEMENT (IOC);  Surgeon: Birder Robson, MD;  Location: [facility];  Service: Ophthalmology;  Laterality: Left;  [place] 00:40.6 AP% 7.3 CDE 2.98 FLUID  PACK LOT # [id number] h  . CORONARY ARTERY BYPASS GRAFT N/A 10/23/2017   Procedure: CORONARY ARTERY BYPASS GRAFTING (CABG) x two  , using left internal mammary artery and right leg greater saphenous vein harvested  endoscopically - LIMA to LAD, SVG to RCA;  Surgeon: Grace Isaac, MD;  Location: [place];  Service: Open Heart Surgery;  Laterality: N/A;  . CORONARY STENT INTERVENTION N/A 09/24/2018   Procedure: CORONARY STENT INTERVENTION;  Surgeon: Jettie Booze, MD;  Location: [place] [facility];  Service: Cardiovascular;  Laterality: N/A;  . ESOPHAGOGASTRODUODENOSCOPY N/A 05/30/2019   Procedure: ESOPHAGOGASTRODUODENOSCOPY (EGD);  Surgeon: Toledo, Benay Pike, MD;  Location: [facility];  Service: Gastroenterology;  Laterality: N/A;  . EYE SURGERY    . LEFT HEART CATH AND CORONARY ANGIOGRAPHY Left 10/09/2017   Procedure: LEFT HEART CATH AND CORONARY ANGIOGRAPHY;  Surgeon: Dionisio [name], MD;  Location:  [place] [facility];  Service: Cardiovascular;  Laterality: Left;  . LEFT HEART CATH AND CORS/GRAFTS ANGIOGRAPHY N/A 09/24/2018   Procedure: LEFT HEART CATH AND CORS/GRAFTS ANGIOGRAPHY;  Surgeon: Jettie Booze, MD;  Location: [place] [facility];  Service: Cardiovascular;  Laterality: N/A;  . TEE WITHOUT CARDIOVERSION N/A 10/23/2017   Procedure: TRANSESOPHAGEAL ECHOCARDIOGRAM (TEE);  Surgeon: Grace Isaac, MD;  Location: [place];  Service: Open Heart Surgery;  Laterality: N/A;    Social History Social History   Tobacco Use  . Smoking status: Former Smoker    Quit date: 09/13/1998    Years since quitting: 22.4  . Smokeless tobacco: Never Used  Vaping Use  . Vaping Use: Never used  Substance Use Topics  . Alcohol use: No  . Drug use: No    Family History Family History  Problem Relation Age of Onset  . Heart failure Mother   . Pulmonary embolism Mother   . Cirrhosis Father   . Coronary artery disease Father   . Diabetes type II Father   . Hypertension Father   .  Kidney disease Father   . Coronary artery disease Sister   . Stroke Sister   . COPD Brother   . Coronary artery disease Brother   . Breast cancer Neg Hx   No family history of bleeding/clotting disorders, porphyria or autoimmune disease   Allergies  Allergen Reactions  . Lisinopril Swelling  . Tramadol Other (See Comments) and Nausea Only    sleepiness Other reaction(s): Dizziness, Other, Other (See Comments) somnolence sleepiness Other reaction(s): Other (See Comments), Other (See Comments) Would not wake up after taking this medication somnolence sleepiness sleepiness   . Amoxicillin Rash    Don't remember  Don't remember  Don't remember   . Clavulanic Acid Rash     REVIEW OF SYSTEMS (Negative unless checked)  Constitutional: [] Weight loss  [] Fever  [] Chills Cardiac: [] Chest pain   [] Chest pressure   [] Palpitations   [] Shortness of breath when laying flat   [] Shortness of breath with exertion. Vascular:  [] Pain in legs with walking   [] Pain in legs at rest  [] History of DVT   [] Phlebitis   [x] Swelling in legs   [] Varicose veins   [] Non-healing ulcers Pulmonary:   [] Uses home oxygen   [] Productive cough   [] Hemoptysis   [] Wheeze  [] COPD   [] Asthma Neurologic:  [] Dizziness   [] Seizures   [] History of stroke   [] History of TIA  [] Aphasia   [] Vissual changes   [x] Weakness or numbness in arm   [x] Weakness or numbness in leg Musculoskeletal:   [] Joint swelling   [x] Joint pain   [x] Low back pain Hematologic:  [] Easy bruising  [] Easy bleeding   [] Hypercoagulable state   [] Anemic Gastrointestinal:  [] Diarrhea   [] Vomiting  [] Gastroesophageal reflux/heartburn   [] Difficulty swallowing. Genitourinary:  [] Chronic kidney disease   [] Difficult urination  [] Frequent urination   [] Blood in urine Skin:  [x] Rashes   [] Ulcers  Psychological:  [] History of anxiety   []  History of major depression.  Physical Examination  Vitals:   03/01/21 1338  BP: 99/62  Pulse: 64  Weight: 125 lb  (56.7 kg)  Height: 5\' 4"  (1.626 m)   Body mass index is 21.46 kg/m. Gen: WD/WN, NAD Head: [place]/AT, No temporalis wasting.  Ear/Nose/Throat: Hearing grossly intact, nares w/o erythema or drainage, poor dentition Eyes: PER, EOMI, sclera nonicteric.  Neck: Supple, no masses.  No bruit or JVD.  Pulmonary:  Good air movement, clear to auscultation bilaterally,  no use of accessory muscles.  Cardiac: RRR, normal S1, S2, no Murmurs. Vascular: scattered varicosities present bilaterally.  Mild venous stasis changes to the legs bilaterally.  2-3+ soft pitting edema. Vessel Right Left  Radial Palpable Palpable  PT Palpable Palpable  DP Palpable Palpable  Gastrointestinal: soft, non-distended. No guarding/no peritoneal signs.  Musculoskeletal: M/S 5/5 throughout.  No deformity or atrophy.  Neurologic: CN 2-12 intact. Pain and light touch intact in extremities.  Symmetrical.  Speech is fluent. Motor exam as listed above. Psychiatric: Judgment intact, Mood & affect appropriate for pt's clinical situation. Dermatologic: No rashes or ulcers noted.  No changes consistent with cellulitis. Lymph : Mild lichenification / skin changes of chronic lymphedema.  CBC Lab Results  Component Value Date   WBC 4.9 10/30/2020   HGB 10.7 (L) 10/30/2020   HCT 32.7 (L) 10/30/2020   MCV 87.7 10/30/2020   PLT 316 10/30/2020    BMET    Component Value Date/Time   NA 139 10/30/2020 0603   NA 140 06/10/2012 0554   K 4.0 10/30/2020 0603   K 3.6 10/17/2012 1106   CL 101 10/30/2020 0603   CL 101 06/10/2012 0554   CO2 28 10/30/2020 0603   CO2 29 06/10/2012 0554   GLUCOSE 131 (H) 10/30/2020 0603   GLUCOSE 159 (H) 06/10/2012 0554   BUN 21 (H) 10/30/2020 0603   BUN 9 06/10/2012 0554   CREATININE 1.02 (H) 10/30/2020 0603   CREATININE 1.35 (H) 06/10/2012 0554   CALCIUM 9.2 10/30/2020 0603   CALCIUM 8.8 06/10/2012 0554   GFRNONAA >60 10/30/2020 0603   GFRNONAA 45 (L) 06/10/2012 0554   GFRAA 56 (L) 06/11/2020 1852    GFRAA 53 (L) 06/10/2012 0554   CrCl cannot be calculated (Patient's most recent lab result is older than the maximum 21 days allowed.).  COAG Lab Results  Component Value Date   INR 1.07 09/24/2018   INR 1.35 10/23/2017   INR 0.99 10/22/2017    Radiology No results found.   Assessment/Plan 1. Lymphedema No surgery or intervention at this point in time.  I have reviewed my discussion with the patient regarding venous insufficiency and why it causes symptoms. I have discussed with the patient the chronic skin changes that accompany venous insufficiency and the long term sequela such as ulceration. Patient will contnue wearing graduated compression stockings on a daily basis, as this has provided excellent control of his edema. The patient will put the stockings on first thing in the morning and removing them in the evening. The patient is reminded not to sleep in the stockings.  In addition, behavioral modification including elevation during the day will be initiated. Exercise is strongly encouraged.  Given the patient's good control and lack of any problems regarding the venous insufficiency and lymphedema a lymph pump in not need at this time.  The patient will follow up with me PRN should anything change.  The patient voices agreement with this plan.   2. Lumbar radiculopathy She has a known history of LS spine disease and has been told she also has arthritis in her neck.  She is complaining of numbness and pins and needle sensations in her hands right more so than left as well as her feet.  She is also been told that she has carpal tunnel at least of her right hand.  She has requested a referral to an orthopedic surgeon.  I have given her Dr. Harden Mo name   3. Primary hypertension Continue antihypertensive medications  as already ordered, these medications have been reviewed and there are no changes at this time.   4. Asthma, unspecified asthma severity, unspecified  whether complicated, unspecified whether persistent Continue pulmonary medications and aerosols as already ordered, these medications have been reviewed and there are no changes at this time.    5. Coronary artery disease of native artery of native heart with stable angina pectoris ([place]) Continue cardiac and antihypertensive medications as already ordered and reviewed, no changes at this time.  Continue statin as ordered and reviewed, no changes at this time  Nitrates PRN for chest pain   6. Mixed hyperlipidemia Continue statin as ordered and reviewed, no changes at this time    Hortencia Pilar, MD  03/04/2021 3:16 PM

## 2021-06-01 DIAGNOSIS — M189 Osteoarthritis of first carpometacarpal joint, unspecified: Secondary | ICD-10-CM | POA: Diagnosis not present

## 2021-06-01 DIAGNOSIS — G5621 Lesion of ulnar nerve, right upper limb: Secondary | ICD-10-CM | POA: Diagnosis not present

## 2021-06-01 DIAGNOSIS — M19019 Primary osteoarthritis, unspecified shoulder: Secondary | ICD-10-CM | POA: Diagnosis not present

## 2021-06-01 DIAGNOSIS — G5603 Carpal tunnel syndrome, bilateral upper limbs: Secondary | ICD-10-CM | POA: Diagnosis not present

## 2021-06-01 DIAGNOSIS — G5602 Carpal tunnel syndrome, left upper limb: Secondary | ICD-10-CM | POA: Diagnosis not present

## 2021-06-01 DIAGNOSIS — G5601 Carpal tunnel syndrome, right upper limb: Secondary | ICD-10-CM | POA: Diagnosis not present

## 2021-06-01 DIAGNOSIS — M19011 Primary osteoarthritis, right shoulder: Secondary | ICD-10-CM | POA: Diagnosis not present

## 2021-06-01 DIAGNOSIS — M4692 Unspecified inflammatory spondylopathy, cervical region: Secondary | ICD-10-CM | POA: Diagnosis not present

## 2021-06-03 DIAGNOSIS — M19011 Primary osteoarthritis, right shoulder: Secondary | ICD-10-CM | POA: Diagnosis not present

## 2021-06-03 DIAGNOSIS — G5603 Carpal tunnel syndrome, bilateral upper limbs: Secondary | ICD-10-CM | POA: Diagnosis not present

## 2021-06-08 DIAGNOSIS — M19012 Primary osteoarthritis, left shoulder: Secondary | ICD-10-CM | POA: Diagnosis not present

## 2021-06-08 DIAGNOSIS — M19011 Primary osteoarthritis, right shoulder: Secondary | ICD-10-CM | POA: Diagnosis not present

## 2021-06-22 DIAGNOSIS — E559 Vitamin D deficiency, unspecified: Secondary | ICD-10-CM | POA: Diagnosis not present

## 2021-06-22 DIAGNOSIS — E1165 Type 2 diabetes mellitus with hyperglycemia: Secondary | ICD-10-CM | POA: Diagnosis not present

## 2021-06-22 DIAGNOSIS — E041 Nontoxic single thyroid nodule: Secondary | ICD-10-CM | POA: Diagnosis not present

## 2021-06-22 DIAGNOSIS — K219 Gastro-esophageal reflux disease without esophagitis: Secondary | ICD-10-CM | POA: Diagnosis not present

## 2021-06-22 DIAGNOSIS — R0602 Shortness of breath: Secondary | ICD-10-CM | POA: Diagnosis not present

## 2021-06-22 DIAGNOSIS — E876 Hypokalemia: Secondary | ICD-10-CM | POA: Diagnosis not present

## 2021-06-22 DIAGNOSIS — I10 Essential (primary) hypertension: Secondary | ICD-10-CM | POA: Diagnosis not present

## 2021-06-22 DIAGNOSIS — E039 Hypothyroidism, unspecified: Secondary | ICD-10-CM | POA: Diagnosis not present

## 2021-06-22 DIAGNOSIS — I25810 Atherosclerosis of coronary artery bypass graft(s) without angina pectoris: Secondary | ICD-10-CM | POA: Diagnosis not present

## 2021-06-22 DIAGNOSIS — E782 Mixed hyperlipidemia: Secondary | ICD-10-CM | POA: Diagnosis not present

## 2021-06-22 DIAGNOSIS — I739 Peripheral vascular disease, unspecified: Secondary | ICD-10-CM | POA: Diagnosis not present

## 2021-06-22 DIAGNOSIS — R079 Chest pain, unspecified: Secondary | ICD-10-CM | POA: Diagnosis not present

## 2021-06-22 DIAGNOSIS — I2510 Atherosclerotic heart disease of native coronary artery without angina pectoris: Secondary | ICD-10-CM | POA: Diagnosis not present

## 2021-06-22 DIAGNOSIS — R252 Cramp and spasm: Secondary | ICD-10-CM | POA: Diagnosis not present

## 2021-07-01 DIAGNOSIS — I6389 Other cerebral infarction: Secondary | ICD-10-CM | POA: Diagnosis not present

## 2021-07-01 DIAGNOSIS — I10 Essential (primary) hypertension: Secondary | ICD-10-CM | POA: Diagnosis not present

## 2021-07-01 DIAGNOSIS — E782 Mixed hyperlipidemia: Secondary | ICD-10-CM | POA: Diagnosis not present

## 2021-07-01 DIAGNOSIS — I25810 Atherosclerosis of coronary artery bypass graft(s) without angina pectoris: Secondary | ICD-10-CM | POA: Diagnosis not present

## 2021-07-01 DIAGNOSIS — R079 Chest pain, unspecified: Secondary | ICD-10-CM | POA: Diagnosis not present

## 2021-07-01 DIAGNOSIS — E119 Type 2 diabetes mellitus without complications: Secondary | ICD-10-CM | POA: Diagnosis not present

## 2021-07-01 DIAGNOSIS — I739 Peripheral vascular disease, unspecified: Secondary | ICD-10-CM | POA: Diagnosis not present

## 2021-07-01 DIAGNOSIS — I2510 Atherosclerotic heart disease of native coronary artery without angina pectoris: Secondary | ICD-10-CM | POA: Diagnosis not present

## 2021-07-05 DIAGNOSIS — I10 Essential (primary) hypertension: Secondary | ICD-10-CM | POA: Diagnosis not present

## 2021-07-05 DIAGNOSIS — E782 Mixed hyperlipidemia: Secondary | ICD-10-CM | POA: Diagnosis not present

## 2021-07-05 DIAGNOSIS — E119 Type 2 diabetes mellitus without complications: Secondary | ICD-10-CM | POA: Diagnosis not present

## 2021-07-05 DIAGNOSIS — I2510 Atherosclerotic heart disease of native coronary artery without angina pectoris: Secondary | ICD-10-CM | POA: Diagnosis not present

## 2021-07-05 DIAGNOSIS — R079 Chest pain, unspecified: Secondary | ICD-10-CM | POA: Diagnosis not present

## 2021-07-05 DIAGNOSIS — I739 Peripheral vascular disease, unspecified: Secondary | ICD-10-CM | POA: Diagnosis not present

## 2021-07-05 DIAGNOSIS — I25810 Atherosclerosis of coronary artery bypass graft(s) without angina pectoris: Secondary | ICD-10-CM | POA: Diagnosis not present

## 2021-07-05 DIAGNOSIS — I6389 Other cerebral infarction: Secondary | ICD-10-CM | POA: Diagnosis not present

## 2021-07-27 DIAGNOSIS — Z951 Presence of aortocoronary bypass graft: Secondary | ICD-10-CM | POA: Diagnosis not present

## 2021-07-27 DIAGNOSIS — M19011 Primary osteoarthritis, right shoulder: Secondary | ICD-10-CM | POA: Diagnosis not present

## 2021-07-27 DIAGNOSIS — Z87891 Personal history of nicotine dependence: Secondary | ICD-10-CM | POA: Diagnosis not present

## 2021-07-27 DIAGNOSIS — I25118 Atherosclerotic heart disease of native coronary artery with other forms of angina pectoris: Secondary | ICD-10-CM | POA: Diagnosis not present

## 2021-07-27 DIAGNOSIS — Z7902 Long term (current) use of antithrombotics/antiplatelets: Secondary | ICD-10-CM | POA: Diagnosis not present

## 2021-07-27 DIAGNOSIS — E1159 Type 2 diabetes mellitus with other circulatory complications: Secondary | ICD-10-CM | POA: Diagnosis not present

## 2021-08-05 DIAGNOSIS — F411 Generalized anxiety disorder: Secondary | ICD-10-CM | POA: Diagnosis not present

## 2021-08-05 DIAGNOSIS — R002 Palpitations: Secondary | ICD-10-CM | POA: Diagnosis not present

## 2021-08-05 DIAGNOSIS — E876 Hypokalemia: Secondary | ICD-10-CM | POA: Diagnosis not present

## 2021-08-05 DIAGNOSIS — E1165 Type 2 diabetes mellitus with hyperglycemia: Secondary | ICD-10-CM | POA: Diagnosis not present

## 2021-08-05 DIAGNOSIS — E782 Mixed hyperlipidemia: Secondary | ICD-10-CM | POA: Diagnosis not present

## 2021-08-06 DIAGNOSIS — M7061 Trochanteric bursitis, right hip: Secondary | ICD-10-CM | POA: Diagnosis not present

## 2021-08-06 DIAGNOSIS — M5416 Radiculopathy, lumbar region: Secondary | ICD-10-CM | POA: Diagnosis not present

## 2021-08-24 ENCOUNTER — Other Ambulatory Visit: Payer: Self-pay | Admitting: Internal Medicine

## 2021-08-24 DIAGNOSIS — Z1231 Encounter for screening mammogram for malignant neoplasm of breast: Secondary | ICD-10-CM

## 2021-08-30 DIAGNOSIS — M5459 Other low back pain: Secondary | ICD-10-CM | POA: Diagnosis not present

## 2021-09-01 DIAGNOSIS — M19011 Primary osteoarthritis, right shoulder: Secondary | ICD-10-CM | POA: Diagnosis not present

## 2021-09-17 DIAGNOSIS — M5416 Radiculopathy, lumbar region: Secondary | ICD-10-CM | POA: Diagnosis not present

## 2021-09-23 DIAGNOSIS — E559 Vitamin D deficiency, unspecified: Secondary | ICD-10-CM | POA: Diagnosis not present

## 2021-09-23 DIAGNOSIS — D519 Vitamin B12 deficiency anemia, unspecified: Secondary | ICD-10-CM | POA: Diagnosis not present

## 2021-09-23 DIAGNOSIS — E782 Mixed hyperlipidemia: Secondary | ICD-10-CM | POA: Diagnosis not present

## 2021-09-23 DIAGNOSIS — K219 Gastro-esophageal reflux disease without esophagitis: Secondary | ICD-10-CM | POA: Diagnosis not present

## 2021-09-23 DIAGNOSIS — R109 Unspecified abdominal pain: Secondary | ICD-10-CM | POA: Diagnosis not present

## 2021-09-23 DIAGNOSIS — E1165 Type 2 diabetes mellitus with hyperglycemia: Secondary | ICD-10-CM | POA: Diagnosis not present

## 2021-09-23 DIAGNOSIS — Z23 Encounter for immunization: Secondary | ICD-10-CM | POA: Diagnosis not present

## 2021-09-23 DIAGNOSIS — E041 Nontoxic single thyroid nodule: Secondary | ICD-10-CM | POA: Diagnosis not present

## 2021-09-23 DIAGNOSIS — E876 Hypokalemia: Secondary | ICD-10-CM | POA: Diagnosis not present

## 2021-09-24 ENCOUNTER — Ambulatory Visit
Admission: RE | Admit: 2021-09-24 | Discharge: 2021-09-24 | Disposition: A | Discharge status: Home / Self Care | Payer: Medicare HMO | Source: Ambulatory Visit | Attending: Internal Medicine | Admitting: Internal Medicine

## 2021-09-24 ENCOUNTER — Other Ambulatory Visit: Payer: Self-pay

## 2021-09-24 DIAGNOSIS — Z1231 Encounter for screening mammogram for malignant neoplasm of breast: Secondary | ICD-10-CM | POA: Insufficient documentation

## 2021-09-29 DIAGNOSIS — M5416 Radiculopathy, lumbar region: Secondary | ICD-10-CM | POA: Diagnosis not present

## 2021-09-29 DIAGNOSIS — M545 Low back pain, unspecified: Secondary | ICD-10-CM | POA: Diagnosis not present

## 2021-10-01 DIAGNOSIS — N390 Urinary tract infection, site not specified: Secondary | ICD-10-CM | POA: Diagnosis not present

## 2021-10-01 DIAGNOSIS — R300 Dysuria: Secondary | ICD-10-CM | POA: Diagnosis not present

## 2021-10-15 DIAGNOSIS — Z20822 Contact with and (suspected) exposure to covid-19: Secondary | ICD-10-CM | POA: Diagnosis not present

## 2021-11-06 DIAGNOSIS — Z880 Allergy status to penicillin: Secondary | ICD-10-CM | POA: Diagnosis not present

## 2021-11-06 DIAGNOSIS — Z7982 Long term (current) use of aspirin: Secondary | ICD-10-CM | POA: Diagnosis not present

## 2021-11-06 DIAGNOSIS — Z20822 Contact with and (suspected) exposure to covid-19: Secondary | ICD-10-CM | POA: Diagnosis not present

## 2021-11-06 DIAGNOSIS — R509 Fever, unspecified: Secondary | ICD-10-CM | POA: Diagnosis not present

## 2021-11-06 DIAGNOSIS — Z885 Allergy status to narcotic agent status: Secondary | ICD-10-CM | POA: Diagnosis not present

## 2021-11-06 DIAGNOSIS — E785 Hyperlipidemia, unspecified: Secondary | ICD-10-CM | POA: Diagnosis not present

## 2021-11-06 DIAGNOSIS — M791 Myalgia, unspecified site: Secondary | ICD-10-CM | POA: Diagnosis not present

## 2021-11-06 DIAGNOSIS — E119 Type 2 diabetes mellitus without complications: Secondary | ICD-10-CM | POA: Diagnosis not present

## 2021-11-06 DIAGNOSIS — E876 Hypokalemia: Secondary | ICD-10-CM | POA: Diagnosis not present

## 2021-11-06 DIAGNOSIS — J101 Influenza due to other identified influenza virus with other respiratory manifestations: Secondary | ICD-10-CM | POA: Diagnosis not present

## 2021-11-06 DIAGNOSIS — R5383 Other fatigue: Secondary | ICD-10-CM | POA: Diagnosis not present

## 2021-11-06 DIAGNOSIS — R059 Cough, unspecified: Secondary | ICD-10-CM | POA: Diagnosis not present

## 2021-11-15 DIAGNOSIS — E782 Mixed hyperlipidemia: Secondary | ICD-10-CM | POA: Diagnosis not present

## 2021-11-15 DIAGNOSIS — E1165 Type 2 diabetes mellitus with hyperglycemia: Secondary | ICD-10-CM | POA: Diagnosis not present

## 2021-11-15 DIAGNOSIS — N644 Mastodynia: Secondary | ICD-10-CM | POA: Diagnosis not present

## 2021-11-17 DIAGNOSIS — E119 Type 2 diabetes mellitus without complications: Secondary | ICD-10-CM | POA: Diagnosis not present

## 2021-11-17 DIAGNOSIS — M5416 Radiculopathy, lumbar region: Secondary | ICD-10-CM | POA: Diagnosis not present

## 2021-11-19 ENCOUNTER — Other Ambulatory Visit: Payer: Self-pay | Admitting: Family

## 2021-11-19 DIAGNOSIS — N644 Mastodynia: Secondary | ICD-10-CM

## 2021-12-01 ENCOUNTER — Other Ambulatory Visit: Payer: Self-pay

## 2021-12-01 ENCOUNTER — Ambulatory Visit
Admission: RE | Admit: 2021-12-01 | Discharge: 2021-12-01 | Disposition: A | Discharge status: Home / Self Care | Payer: Medicare HMO | Source: Ambulatory Visit | Attending: Family | Admitting: Family

## 2021-12-01 DIAGNOSIS — N644 Mastodynia: Secondary | ICD-10-CM

## 2021-12-01 DIAGNOSIS — R922 Inconclusive mammogram: Secondary | ICD-10-CM | POA: Diagnosis not present

## 2021-12-03 DIAGNOSIS — M5416 Radiculopathy, lumbar region: Secondary | ICD-10-CM | POA: Diagnosis not present

## 2021-12-24 DIAGNOSIS — E1165 Type 2 diabetes mellitus with hyperglycemia: Secondary | ICD-10-CM | POA: Diagnosis not present

## 2021-12-24 DIAGNOSIS — K219 Gastro-esophageal reflux disease without esophagitis: Secondary | ICD-10-CM | POA: Diagnosis not present

## 2021-12-24 DIAGNOSIS — I10 Essential (primary) hypertension: Secondary | ICD-10-CM | POA: Diagnosis not present

## 2021-12-24 DIAGNOSIS — E782 Mixed hyperlipidemia: Secondary | ICD-10-CM | POA: Diagnosis not present

## 2022-01-09 DIAGNOSIS — E782 Mixed hyperlipidemia: Secondary | ICD-10-CM | POA: Diagnosis not present

## 2022-01-09 DIAGNOSIS — E1165 Type 2 diabetes mellitus with hyperglycemia: Secondary | ICD-10-CM | POA: Diagnosis not present

## 2022-01-09 DIAGNOSIS — I10 Essential (primary) hypertension: Secondary | ICD-10-CM | POA: Diagnosis not present

## 2022-01-10 DIAGNOSIS — R300 Dysuria: Secondary | ICD-10-CM | POA: Diagnosis not present

## 2022-01-10 DIAGNOSIS — N390 Urinary tract infection, site not specified: Secondary | ICD-10-CM | POA: Diagnosis not present

## 2022-01-27 DIAGNOSIS — M19011 Primary osteoarthritis, right shoulder: Secondary | ICD-10-CM | POA: Diagnosis not present

## 2022-02-17 DIAGNOSIS — E559 Vitamin D deficiency, unspecified: Secondary | ICD-10-CM | POA: Diagnosis not present

## 2022-02-17 DIAGNOSIS — D519 Vitamin B12 deficiency anemia, unspecified: Secondary | ICD-10-CM | POA: Diagnosis not present

## 2022-02-17 DIAGNOSIS — E782 Mixed hyperlipidemia: Secondary | ICD-10-CM | POA: Diagnosis not present

## 2022-02-17 DIAGNOSIS — R109 Unspecified abdominal pain: Secondary | ICD-10-CM | POA: Diagnosis not present

## 2022-02-17 DIAGNOSIS — E1165 Type 2 diabetes mellitus with hyperglycemia: Secondary | ICD-10-CM | POA: Diagnosis not present

## 2022-02-17 DIAGNOSIS — E041 Nontoxic single thyroid nodule: Secondary | ICD-10-CM | POA: Diagnosis not present

## 2022-02-17 DIAGNOSIS — R1903 Right lower quadrant abdominal swelling, mass and lump: Secondary | ICD-10-CM | POA: Diagnosis not present

## 2022-02-22 DIAGNOSIS — R1903 Right lower quadrant abdominal swelling, mass and lump: Secondary | ICD-10-CM | POA: Diagnosis not present

## 2022-03-11 DIAGNOSIS — E1165 Type 2 diabetes mellitus with hyperglycemia: Secondary | ICD-10-CM | POA: Diagnosis not present

## 2022-03-11 DIAGNOSIS — E782 Mixed hyperlipidemia: Secondary | ICD-10-CM | POA: Diagnosis not present

## 2022-03-11 DIAGNOSIS — I10 Essential (primary) hypertension: Secondary | ICD-10-CM | POA: Diagnosis not present

## 2022-03-17 ENCOUNTER — Encounter: Payer: Self-pay | Admitting: Surgery

## 2022-03-17 ENCOUNTER — Ambulatory Visit (INDEPENDENT_AMBULATORY_CARE_PROVIDER_SITE_OTHER): Payer: Medicare HMO | Admitting: Surgery

## 2022-03-17 VITALS — BP 107/66 | HR 64 | Temp 98.4°F | Ht 63.0 in | Wt 123.8 lb

## 2022-03-17 DIAGNOSIS — R1031 Right lower quadrant pain: Secondary | ICD-10-CM | POA: Diagnosis not present

## 2022-03-17 DIAGNOSIS — M25551 Pain in right hip: Secondary | ICD-10-CM | POA: Diagnosis not present

## 2022-03-17 DIAGNOSIS — M19019 Primary osteoarthritis, unspecified shoulder: Secondary | ICD-10-CM | POA: Insufficient documentation

## 2022-03-17 DIAGNOSIS — M19011 Primary osteoarthritis, right shoulder: Secondary | ICD-10-CM | POA: Diagnosis not present

## 2022-03-17 NOTE — Progress Notes (Signed)
Patient ID: Kayla Malone, female   DOB: 1961/04/09, 61 y.o.   MRN: [id number] ? ?Chief Complaint: Right groin perineal and hip pain ? ?History of Present Illness ?Kayla Malone is a 61 y.o. female with complaints of pain on a 6-7 out of 10 scale involving the right groin/perineal/hip area, even radiation to the buttock.  Getting worse over the year.  She reports exacerbating features include position extension of the hip, direct positioning of the body, lateral rotation at the hip, and other positional changes.  It seems to catch her at times.  She denies any radiating pain down the leg.  She denies any bulge in the groin.  She denies cough or sneezing as being any precipitating factor.  She denies lifting causing any issues with the groin pain either. ?She presents with a CT scan report dated February 28, 2022.  This report was by [facility] overread services.  The impression is negative CT examination of the abdomen and pelvis, with details indicating that no hernia was identified.  Along with a normal appendix. ?She indicates that since her stroke she has had to adjust some of her mobility, and the way she ambulates utilizing a cane.  I am suspicious that may have something to do with causing other stresses on her right hip or pelvic area. ?Past Medical History ?Past Medical History:  ?Diagnosis Date  ? Asthma   ? Coronary artery disease   ? Depression   ? Diabetes mellitus without complication ([place])   ? GERD (gastroesophageal reflux disease)   ? History of hiatal hernia   ? Hypertension   ? Pain   ? BACK  ? Stroke [facility])   ? x3  ? Thyroid cyst   ?  ? ? ?Past Surgical History:  ?Procedure Laterality Date  ? ABDOMINAL HYSTERECTOMY    ? CARDIAC SURGERY    ? CATARACT EXTRACTION W/PHACO Left 08/15/2017  ? Procedure: CATARACT EXTRACTION PHACO AND INTRAOCULAR LENS PLACEMENT (IOC);  Surgeon: Birder Robson, MD;  Location: [facility];  Service: Ophthalmology;  Laterality: Left;  [place] 00:40.6 ?AP% 7.3 ?CDE  2.98 ?FLUID PACK LOT # [id number] h  ? CORONARY ARTERY BYPASS GRAFT N/A 10/23/2017  ? Procedure: CORONARY ARTERY BYPASS GRAFTING (CABG) x two  , using left internal mammary artery and right leg greater saphenous vein harvested  endoscopically - LIMA to LAD, SVG to RCA;  Surgeon: Grace Isaac, MD;  Location: [place];  Service: Open Heart Surgery;  Laterality: N/A;  ? CORONARY STENT INTERVENTION N/A 09/24/2018  ? Procedure: CORONARY STENT INTERVENTION;  Surgeon: Jettie Booze, MD;  Location: [place] [facility];  Service: Cardiovascular;  Laterality: N/A;  ? ESOPHAGOGASTRODUODENOSCOPY N/A 05/30/2019  ? Procedure: ESOPHAGOGASTRODUODENOSCOPY (EGD);  Surgeon: Toledo, Benay Pike, MD;  Location: [facility];  Service: Gastroenterology;  Laterality: N/A;  ? EYE SURGERY    ? LEFT HEART CATH AND CORONARY ANGIOGRAPHY Left 10/09/2017  ? Procedure: LEFT HEART CATH AND CORONARY ANGIOGRAPHY;  Surgeon: Dionisio [name], MD;  Location: [place] [facility];  Service: Cardiovascular;  Laterality: Left;  ? LEFT HEART CATH AND CORS/GRAFTS ANGIOGRAPHY N/A 09/24/2018  ? Procedure: LEFT HEART CATH AND CORS/GRAFTS ANGIOGRAPHY;  Surgeon: Jettie Booze, MD;  Location: [place] [facility];  Service: Cardiovascular;  Laterality: N/A;  ? TEE WITHOUT CARDIOVERSION N/A 10/23/2017  ? Procedure: TRANSESOPHAGEAL ECHOCARDIOGRAM (TEE);  Surgeon: Grace Isaac, MD;  Location: [place];  Service: Open Heart Surgery;  Laterality: N/A;  ? ? ?Allergies  ?Allergen Reactions  ?  Lisinopril Swelling  ? Tramadol Other (See Comments) and Nausea Only  ?  sleepiness ?Other reaction(s): Dizziness, Other, Other (See Comments) ?somnolence ?sleepiness ?Other reaction(s): Other (See Comments), Other (See Comments) ?Would not wake up after taking this medication ?somnolence ?sleepiness ?sleepiness ?  ? Amoxicillin Rash  ?  Don't remember  ?Don't remember  ?Don't remember   ? Clavulanic Acid Rash  ? ? ?Current Outpatient Medications  ?Medication Sig  Dispense Refill  ? Accu-Chek Softclix Lancets lancets     ? acetaminophen (TYLENOL) 500 MG tablet Take 1,000 mg by mouth every 6 (six) hours as needed for mild pain or headache.    ? amLODipine (NORVASC) 5 MG tablet Take by mouth.    ? aspirin EC 81 MG tablet Take 81 mg by mouth daily.    ? atorvastatin (LIPITOR) 40 MG tablet Take 80 mg by mouth daily.     ? Bempedoic Acid-Ezetimibe (NEXLIZET) 180-10 MG TABS Take 1 tablet by mouth daily.    ? Blood Glucose Monitoring Suppl (ACCU-CHEK GUIDE ME) w/Device KIT     ? chlorthalidone (HYGROTON) 25 MG tablet Chlorthalidone 25 MG Oral Tablet QTY: 90 tablet Days: 90 Refills: 2  Written: 04/09/20 Patient Instructions: once a day    ? citalopram (CELEXA) 20 MG tablet Take 20 mg by mouth daily.    ? clopidogrel (PLAVIX) 75 MG tablet Take by mouth.    ? fluticasone (FLONASE) 50 MCG/ACT nasal spray Place 2 sprays into both nostrils daily.    ? Fluticasone-Salmeterol (ADVAIR) 500-50 MCG/DOSE AEPB Inhale 1 puff into the lungs daily as needed (SOB).     ? glucose blood (ACCU-CHEK GUIDE) test strip Blood Glucose Test In Vitro Strip QTY: 100 strip Days: 90 Refills: 3  Written: 06/07/19 Patient Instructions: E11.9 - to check blodd sugars once daily    ? isosorbide mononitrate (IMDUR) 30 MG 24 hr tablet Take 30 mg by mouth daily.    ? linezolid (ZYVOX) 600 MG tablet Take by mouth.    ? losartan (COZAAR) 50 MG tablet Take 50 mg by mouth daily.  2  ? meloxicam (MOBIC) 15 MG tablet Take by mouth.    ? metoprolol succinate (TOPROL-XL) 50 MG 24 hr tablet Take 50 mg by mouth daily. Take with or immediately following a meal.    ? Multiple Minerals-Vitamins (CITRACAL PLUS) TABS Magnesium 250 MG Oral Tablet QTY: 0 tablet Days: 0 Refills: 0  Written: 08/15/19 Patient Instructions:    ? nitroGLYCERIN (NITROSTAT) 0.4 MG SL tablet Place 1 tablet (0.4 mg total) under the tongue every 5 (five) minutes x 3 doses as needed for chest pain. 25 tablet 0  ? pantoprazole (PROTONIX) 40 MG tablet Take 40 mg by  mouth.     ? potassium chloride 20 MEQ TBCR Take 20 mEq by mouth daily. 30 tablet 0  ? prochlorperazine (COMPAZINE) 25 MG suppository Place 1 suppository (25 mg total) rectally every 8 (eight) hours as needed for nausea or vomiting. 12 suppository 0  ? promethazine (PHENERGAN) 12.5 MG tablet     ? tetracycline (SUMYCIN) 500 MG capsule Tetracycline HCl 500 MG Oral Capsule QTY: 56 capsule Days: 14 Refills: 0  Written: 06/05/20 Patient Instructions: four times a day    ? traZODone (DESYREL) 50 MG tablet Take 50 mg by mouth at bedtime.  5  ? VOLTAREN 1 % GEL Apply 2 g topically as needed (pain in shoulder).   2  ? metoCLOPramide (REGLAN) 5 MG tablet Take 1 tablet (5 mg total)  by mouth 3 (three) times daily before meals for 30 days. 90 tablet 0  ? ?No current facility-administered medications for this visit.  ? ? ?Family History ?Family History  ?Problem Relation Age of Onset  ? Heart failure Mother   ? Pulmonary embolism Mother   ? Cirrhosis Father   ? Coronary artery disease Father   ? Diabetes type II Father   ? Hypertension Father   ? Kidney disease Father   ? Coronary artery disease Sister   ? Stroke Sister   ? COPD Brother   ? Coronary artery disease Brother   ? Breast cancer Neg Hx   ?  ? ? ?Social History ?Social History  ? ?Tobacco Use  ? Smoking status: Former  ?  Types: Cigarettes  ?  Quit date: 09/13/1998  ?  Years since quitting: 23.5  ? Smokeless tobacco: Never  ?Vaping Use  ? Vaping Use: Never used  ?Substance Use Topics  ? Alcohol use: No  ? Drug use: No  ?  ?  ? ? ?Review of Systems  ?Constitutional:  Positive for chills. Negative for fever and weight loss.  ?HENT:  Negative for hearing loss.   ?Eyes:  Negative for blurred vision.  ?Respiratory:  Negative for cough.   ?Cardiovascular:  Negative for chest pain.  ?Gastrointestinal:  Negative for abdominal pain, nausea and vomiting.  ?Genitourinary:  Negative for dysuria.  ?Skin: Negative.   ?Neurological: Negative.   ?Psychiatric/Behavioral: Negative.    ?   ? ?Physical Exam ?Blood pressure 107/66, pulse 64, temperature 98.4 ?F (36.9 ?C), temperature source Oral, height _0  (1.6 m), weight 123 lb 12.8 oz (56.2 kg), SpO2 98 %. ?Last Weight  Most recent update: 03/18/19

## 2022-03-17 NOTE — Patient Instructions (Addendum)
Please call [facility] and schedule an appointment.  ? ?If you have any concerns or questions, please feel free to call our office. Follow up as needed.  ? ? ?Hip Pain ? ?The hip is the joint between the upper legs and the lower pelvis. The bones, cartilage, tendons, and muscles of your hip joint support your body and allow you to move around. ?Hip pain can range from a minor ache to severe pain in one or both of your hips. The pain may be felt on the inside of the hip joint near the groin, or on the outside near the buttocks and upper thigh. You may also have swelling or stiffness in your hip area. ?Follow these instructions at home: ?Managing pain, stiffness, and swelling ?  ?If directed, put ice on the painful area. To do this: ?Put ice in a plastic bag. ?Place a towel between your skin and the bag. ?Leave the ice on for 20 minutes, 2-3 times a day. ?If directed, apply heat to the affected area as often as told by your health care provider. Use the heat source that your health care provider recommends, such as a moist heat pack or a heating pad. ?Place a towel between your skin and the heat source. ?Leave the heat on for 20-30 minutes. ?Remove the heat if your skin turns bright red. This is especially important if you are unable to feel pain, heat, or cold. You may have a greater risk of getting burned. ?Activity ?Do exercises as told by your health care provider. ?Avoid activities that cause pain. ?General instructions ? ?Take over-the-counter and prescription medicines only as told by your health care provider. ?Keep a journal of your symptoms. Write down: ?How often you have hip pain. ?The location of your pain. ?What the pain feels like. ?What makes the pain worse. ?Sleep with a pillow between your legs on your most comfortable side. ?Keep all follow-up visits as told by your health care provider. This is important. ?Contact a health care provider if: ?You cannot put weight on your leg. ?Your pain or  swelling continues or gets worse after one week. ?It gets harder to walk. ?You have a fever. ?Get help right away if: ?You fall. ?You have a sudden increase in pain and swelling in your hip. ?Your hip is red or swollen or very tender to touch. ?Summary ?Hip pain can range from a minor ache to severe pain in one or both of your hips. ?The pain may be felt on the inside of the hip joint near the groin, or on the outside near the buttocks and upper thigh. ?Avoid activities that cause pain. ?Write down how often you have hip pain, the location of the pain, what makes it worse, and what it feels like. ?This information is not intended to replace advice given to you by your health care provider. Make sure you discuss any questions you have with your health care provider. ?Document Revised: 04/15/2019 Document Reviewed: 04/15/2019 ?Elsevier Patient Education ? [place]. ? ?

## 2022-03-18 DIAGNOSIS — M25551 Pain in right hip: Secondary | ICD-10-CM | POA: Diagnosis not present

## 2022-03-24 DIAGNOSIS — E1165 Type 2 diabetes mellitus with hyperglycemia: Secondary | ICD-10-CM | POA: Diagnosis not present

## 2022-03-24 DIAGNOSIS — I10 Essential (primary) hypertension: Secondary | ICD-10-CM | POA: Diagnosis not present

## 2022-03-24 DIAGNOSIS — E039 Hypothyroidism, unspecified: Secondary | ICD-10-CM | POA: Diagnosis not present

## 2022-03-24 DIAGNOSIS — M2550 Pain in unspecified joint: Secondary | ICD-10-CM | POA: Diagnosis not present

## 2022-03-24 DIAGNOSIS — E782 Mixed hyperlipidemia: Secondary | ICD-10-CM | POA: Diagnosis not present

## 2022-03-28 DIAGNOSIS — F40240 Claustrophobia: Secondary | ICD-10-CM | POA: Insufficient documentation

## 2022-03-29 DIAGNOSIS — M25551 Pain in right hip: Secondary | ICD-10-CM | POA: Diagnosis not present

## 2022-04-01 DIAGNOSIS — M1611 Unilateral primary osteoarthritis, right hip: Secondary | ICD-10-CM | POA: Insufficient documentation

## 2022-04-11 ENCOUNTER — Encounter: Payer: Self-pay | Admitting: Surgery

## 2022-04-11 ENCOUNTER — Ambulatory Visit (INDEPENDENT_AMBULATORY_CARE_PROVIDER_SITE_OTHER): Payer: Medicare HMO | Admitting: Surgery

## 2022-04-11 VITALS — BP 120/71 | HR 52 | Temp 98.0°F | Ht 63.0 in | Wt 125.0 lb

## 2022-04-11 DIAGNOSIS — M25551 Pain in right hip: Secondary | ICD-10-CM

## 2022-04-11 NOTE — Patient Instructions (Signed)
Please call our office if you have any questions or concerns.  

## 2022-04-11 NOTE — Progress Notes (Signed)
Outpatient Surgical Follow Up ? ?04/11/2022 ? ?Kayla Malone is an 61 y.o. female.  ? ?Chief Complaint  ?Patient presents with  ? Follow-up  ? ? ?TKW:IOXB is 61 yo PMHx significant for T2DM, HLD, CVA, HTN, depression, asthma, GERD, sickle cell trait  and osteoarthritis of the Right hip who presents for evaluation of Right inguinal pain. ?On further interrogation of the patient she reports that the main pain occurs in the right hip that worsens upon ambulation and movement.  Pain is sharp moderate intensity.  She does have associated limp and she uses a cane for support and to alleviate some of the pain.  She did have a CT scan that the images are not available that I have personally reviewed the report showing no evidence of inguinal hernias.  She was seen by Dr. Harlow Mares at [facility] and had hip injection due to osteoarthritis.  He believes that the patient ultimately may need hip replacement ?She does take dual antiplatelet therapy to include aspirin and Plavix ? ? ?Past Medical History:  ?Diagnosis Date  ? Asthma   ? Coronary artery disease   ? Depression   ? Diabetes mellitus without complication ([place])   ? GERD (gastroesophageal reflux disease)   ? History of hiatal hernia   ? Hypertension   ? Pain   ? BACK  ? Stroke [facility])   ? x3  ? Thyroid cyst   ? ? ?Past Surgical History:  ?Procedure Laterality Date  ? ABDOMINAL HYSTERECTOMY    ? CARDIAC SURGERY    ? CATARACT EXTRACTION W/PHACO Left 08/15/2017  ? Procedure: CATARACT EXTRACTION PHACO AND INTRAOCULAR LENS PLACEMENT (IOC);  Surgeon: Birder Robson, MD;  Location: [facility];  Service: Ophthalmology;  Laterality: Left;  [place] 00:40.6 ?AP% 7.3 ?CDE 2.98 ?FLUID PACK LOT # [id number]  ? CORONARY ARTERY BYPASS GRAFT N/A 10/23/2017  ? Procedure: CORONARY ARTERY BYPASS GRAFTING (CABG) x two  , using left internal mammary artery and right leg greater saphenous vein harvested  endoscopically - LIMA to LAD, SVG to RCA;  Surgeon: Grace Isaac, MD;  Location: [facility];   Service: Open Heart Surgery;  Laterality: N/A;  ? CORONARY STENT INTERVENTION N/A 09/24/2018  ? Procedure: CORONARY STENT INTERVENTION;  Surgeon: Jettie Booze, MD;  Location: [place] [facility];  Service: Cardiovascular;  Laterality: N/A;  ? ESOPHAGOGASTRODUODENOSCOPY N/A 05/30/2019  ? Procedure: ESOPHAGOGASTRODUODENOSCOPY (EGD);  Surgeon: Toledo, Benay Pike, MD;  Location: [facility];  Service: Gastroenterology;  Laterality: N/A;  ? EYE SURGERY    ? LEFT HEART CATH AND CORONARY ANGIOGRAPHY Left 10/09/2017  ? Procedure: LEFT HEART CATH AND CORONARY ANGIOGRAPHY;  Surgeon: Dionisio [name], MD;  Location: [place] [facility];  Service: Cardiovascular;  Laterality: Left;  ? LEFT HEART CATH AND CORS/GRAFTS ANGIOGRAPHY N/A 09/24/2018  ? Procedure: LEFT HEART CATH AND CORS/GRAFTS ANGIOGRAPHY;  Surgeon: Jettie Booze, MD;  Location: [place] [facility];  Service: Cardiovascular;  Laterality: N/A;  ? TEE WITHOUT CARDIOVERSION N/A 10/23/2017  ? Procedure: TRANSESOPHAGEAL ECHOCARDIOGRAM (TEE);  Surgeon: Grace Isaac, MD;  Location: [place];  Service: Open Heart Surgery;  Laterality: N/A;  ? ? ?Family History  ?Problem Relation Age of Onset  ? Heart failure Mother   ? Pulmonary embolism Mother   ? Cirrhosis Father   ? Coronary artery disease Father   ? Diabetes type II Father   ? Hypertension Father   ? Kidney disease Father   ? Coronary artery disease Sister   ?  Stroke Sister   ? COPD Brother   ? Coronary artery disease Brother   ? Breast cancer Neg Hx   ? ? ?Social History:  reports that she quit smoking about 23 years ago. Her smoking use included cigarettes. She has never used smokeless tobacco. She reports that she does not drink alcohol and does not use drugs. ? ?Allergies:  ?Allergies  ?Allergen Reactions  ? Lisinopril Swelling  ? Tramadol Other (See Comments) and Nausea Only  ?  sleepiness ?Other reaction(s): Dizziness, Other, Other (See Comments) ?somnolence ?sleepiness ?Other reaction(s): Other  (See Comments), Other (See Comments) ?Would not wake up after taking this medication ?somnolence ?sleepiness ?sleepiness ?  ? Amoxicillin Rash  ?  Don't remember  ?Don't remember  ?Don't remember   ? Clavulanic Acid Rash  ? ? ?Medications reviewed. ? ? ? ?ROS ?Full ROS performed and is otherwise negative other than what is stated in HPI ? ? ?BP 120/71   Pulse (!) 52   Temp 98 ?F (36.7 ?C)   Ht '5\' 3"'$  (1.6 m)   Wt 125 lb (56.7 kg)   SpO2 97%   BMI 22.14 kg/m?  ? ?Physical Exam ?Vitals and nursing note reviewed. Exam conducted with a chaperone present.  ?Constitutional:   ?   General: She is not in acute distress. ?   Appearance: Normal appearance. She is not ill-appearing.  ?Cardiovascular:  ?   Rate and Rhythm: Normal rate and regular rhythm.  ?   Heart sounds: No murmur heard. ?  No friction rub.  ?Pulmonary:  ?   Effort: Pulmonary effort is normal. No respiratory distress.  ?   Breath sounds: Normal breath sounds. No stridor.  ?Abdominal:  ?   General: Abdomen is flat. There is no distension.  ?   Palpations: Abdomen is soft. There is no mass.  ?   Tenderness: There is no abdominal tenderness. There is no guarding or rebound.  ?   Hernia: No hernia is present.  ?   Comments: Inguinal regions w/o evidence of hernias or tenderness. Pain is evident when moving her right hip  ?Musculoskeletal:  ?   Cervical back: Normal range of motion and neck supple. No rigidity or tenderness.  ?   Comments: Unable to extend the hip due to hip pain  ?Skin: ?   General: Skin is warm and dry.  ?   Capillary Refill: Capillary refill takes less than 2 seconds.  ?Neurological:  ?   General: No focal deficit present.  ?   Mental Status: She is alert and oriented to person, place, and time.  ?Psychiatric:     ?   Mood and Affect: Mood normal.     ?   Behavior: Behavior normal.     ?   Thought Content: Thought content normal.     ?   Judgment: Judgment normal.  ? ? ?Assessment/Plan: ?61 year old female with right hip osteoarthritis  causing significant pain issues.  From a general surgery perspective there is no evidence of any inguinal hernias at this time both on physical exams or CT scans.  At this point I do not recommend any surgical intervention.  She will go back to the orthopedic surgeon in the next week or so to discuss options for potential replacement.  We will be happy to see her after she gets definitive treatment with the orthopedic surgeon.  Please note that I spent 30 minutes in this encounter including personally reviewing all records, placing orders, counseling the  patient and performing appropriate mentation. ? ?Caroleen Hamman, MD FACS ?General Surgeon  ?

## 2022-05-04 DIAGNOSIS — E119 Type 2 diabetes mellitus without complications: Secondary | ICD-10-CM | POA: Diagnosis not present

## 2022-05-04 DIAGNOSIS — M25551 Pain in right hip: Secondary | ICD-10-CM | POA: Diagnosis not present

## 2022-05-13 DIAGNOSIS — M19011 Primary osteoarthritis, right shoulder: Secondary | ICD-10-CM | POA: Diagnosis not present

## 2022-06-02 DIAGNOSIS — E1165 Type 2 diabetes mellitus with hyperglycemia: Secondary | ICD-10-CM | POA: Diagnosis not present

## 2022-06-02 DIAGNOSIS — R112 Nausea with vomiting, unspecified: Secondary | ICD-10-CM | POA: Diagnosis not present

## 2022-06-02 DIAGNOSIS — R109 Unspecified abdominal pain: Secondary | ICD-10-CM | POA: Diagnosis not present

## 2022-06-02 DIAGNOSIS — E782 Mixed hyperlipidemia: Secondary | ICD-10-CM | POA: Diagnosis not present

## 2022-06-10 DIAGNOSIS — R079 Chest pain, unspecified: Secondary | ICD-10-CM | POA: Diagnosis not present

## 2022-06-10 DIAGNOSIS — I25810 Atherosclerosis of coronary artery bypass graft(s) without angina pectoris: Secondary | ICD-10-CM | POA: Diagnosis not present

## 2022-06-10 DIAGNOSIS — I10 Essential (primary) hypertension: Secondary | ICD-10-CM | POA: Diagnosis not present

## 2022-06-10 DIAGNOSIS — E1165 Type 2 diabetes mellitus with hyperglycemia: Secondary | ICD-10-CM | POA: Diagnosis not present

## 2022-06-10 DIAGNOSIS — I739 Peripheral vascular disease, unspecified: Secondary | ICD-10-CM | POA: Diagnosis not present

## 2022-06-10 DIAGNOSIS — E1151 Type 2 diabetes mellitus with diabetic peripheral angiopathy without gangrene: Secondary | ICD-10-CM | POA: Diagnosis not present

## 2022-06-10 DIAGNOSIS — E782 Mixed hyperlipidemia: Secondary | ICD-10-CM | POA: Diagnosis not present

## 2022-06-10 DIAGNOSIS — E119 Type 2 diabetes mellitus without complications: Secondary | ICD-10-CM | POA: Diagnosis not present

## 2022-06-10 DIAGNOSIS — I2510 Atherosclerotic heart disease of native coronary artery without angina pectoris: Secondary | ICD-10-CM | POA: Diagnosis not present

## 2022-06-16 DIAGNOSIS — N390 Urinary tract infection, site not specified: Secondary | ICD-10-CM | POA: Diagnosis not present

## 2022-06-16 DIAGNOSIS — R079 Chest pain, unspecified: Secondary | ICD-10-CM | POA: Diagnosis not present

## 2022-06-21 DIAGNOSIS — R079 Chest pain, unspecified: Secondary | ICD-10-CM | POA: Diagnosis not present

## 2022-06-21 DIAGNOSIS — R0602 Shortness of breath: Secondary | ICD-10-CM | POA: Diagnosis not present

## 2022-06-23 DIAGNOSIS — R300 Dysuria: Secondary | ICD-10-CM | POA: Diagnosis not present

## 2022-06-23 DIAGNOSIS — E559 Vitamin D deficiency, unspecified: Secondary | ICD-10-CM | POA: Diagnosis not present

## 2022-06-23 DIAGNOSIS — E782 Mixed hyperlipidemia: Secondary | ICD-10-CM | POA: Diagnosis not present

## 2022-06-23 DIAGNOSIS — I10 Essential (primary) hypertension: Secondary | ICD-10-CM | POA: Diagnosis not present

## 2022-06-23 DIAGNOSIS — E041 Nontoxic single thyroid nodule: Secondary | ICD-10-CM | POA: Diagnosis not present

## 2022-06-23 DIAGNOSIS — E1165 Type 2 diabetes mellitus with hyperglycemia: Secondary | ICD-10-CM | POA: Diagnosis not present

## 2022-06-23 DIAGNOSIS — N3001 Acute cystitis with hematuria: Secondary | ICD-10-CM | POA: Diagnosis not present

## 2022-06-23 DIAGNOSIS — R310 Gross hematuria: Secondary | ICD-10-CM | POA: Diagnosis not present

## 2022-06-23 DIAGNOSIS — E876 Hypokalemia: Secondary | ICD-10-CM | POA: Diagnosis not present

## 2022-06-30 DIAGNOSIS — E1165 Type 2 diabetes mellitus with hyperglycemia: Secondary | ICD-10-CM | POA: Diagnosis not present

## 2022-06-30 DIAGNOSIS — E041 Nontoxic single thyroid nodule: Secondary | ICD-10-CM | POA: Diagnosis not present

## 2022-06-30 DIAGNOSIS — I2510 Atherosclerotic heart disease of native coronary artery without angina pectoris: Secondary | ICD-10-CM | POA: Diagnosis not present

## 2022-06-30 DIAGNOSIS — I739 Peripheral vascular disease, unspecified: Secondary | ICD-10-CM | POA: Diagnosis not present

## 2022-06-30 DIAGNOSIS — I25810 Atherosclerosis of coronary artery bypass graft(s) without angina pectoris: Secondary | ICD-10-CM | POA: Diagnosis not present

## 2022-06-30 DIAGNOSIS — E559 Vitamin D deficiency, unspecified: Secondary | ICD-10-CM | POA: Diagnosis not present

## 2022-06-30 DIAGNOSIS — R079 Chest pain, unspecified: Secondary | ICD-10-CM | POA: Diagnosis not present

## 2022-06-30 DIAGNOSIS — R0602 Shortness of breath: Secondary | ICD-10-CM | POA: Diagnosis not present

## 2022-06-30 DIAGNOSIS — E876 Hypokalemia: Secondary | ICD-10-CM | POA: Diagnosis not present

## 2022-06-30 DIAGNOSIS — E782 Mixed hyperlipidemia: Secondary | ICD-10-CM | POA: Diagnosis not present

## 2022-06-30 DIAGNOSIS — I10 Essential (primary) hypertension: Secondary | ICD-10-CM | POA: Diagnosis not present

## 2022-07-02 ENCOUNTER — Inpatient Hospital Stay
Admission: EM | Admit: 2022-07-02 | Discharge: 2022-07-04 | DRG: 880 | Disposition: A | Discharge status: Home / Self Care | Payer: Medicare HMO | Attending: Osteopathic Medicine | Admitting: Osteopathic Medicine

## 2022-07-02 ENCOUNTER — Other Ambulatory Visit: Payer: Self-pay

## 2022-07-02 ENCOUNTER — Emergency Department: Payer: Medicare HMO

## 2022-07-02 DIAGNOSIS — Z90710 Acquired absence of both cervix and uterus: Secondary | ICD-10-CM

## 2022-07-02 DIAGNOSIS — E785 Hyperlipidemia, unspecified: Secondary | ICD-10-CM | POA: Diagnosis present

## 2022-07-02 DIAGNOSIS — E782 Mixed hyperlipidemia: Secondary | ICD-10-CM | POA: Diagnosis present

## 2022-07-02 DIAGNOSIS — Z833 Family history of diabetes mellitus: Secondary | ICD-10-CM | POA: Diagnosis not present

## 2022-07-02 DIAGNOSIS — Z8249 Family history of ischemic heart disease and other diseases of the circulatory system: Secondary | ICD-10-CM | POA: Diagnosis not present

## 2022-07-02 DIAGNOSIS — I272 Pulmonary hypertension, unspecified: Secondary | ICD-10-CM | POA: Diagnosis not present

## 2022-07-02 DIAGNOSIS — I25810 Atherosclerosis of coronary artery bypass graft(s) without angina pectoris: Secondary | ICD-10-CM | POA: Diagnosis not present

## 2022-07-02 DIAGNOSIS — I214 Non-ST elevation (NSTEMI) myocardial infarction: Secondary | ICD-10-CM | POA: Diagnosis present

## 2022-07-02 DIAGNOSIS — K219 Gastro-esophageal reflux disease without esophagitis: Secondary | ICD-10-CM | POA: Diagnosis present

## 2022-07-02 DIAGNOSIS — Z8711 Personal history of peptic ulcer disease: Secondary | ICD-10-CM

## 2022-07-02 DIAGNOSIS — Z955 Presence of coronary angioplasty implant and graft: Secondary | ICD-10-CM

## 2022-07-02 DIAGNOSIS — Z823 Family history of stroke: Secondary | ICD-10-CM | POA: Diagnosis not present

## 2022-07-02 DIAGNOSIS — Z825 Family history of asthma and other chronic lower respiratory diseases: Secondary | ICD-10-CM | POA: Diagnosis not present

## 2022-07-02 DIAGNOSIS — K279 Peptic ulcer, site unspecified, unspecified as acute or chronic, without hemorrhage or perforation: Secondary | ICD-10-CM | POA: Insufficient documentation

## 2022-07-02 DIAGNOSIS — R079 Chest pain, unspecified: Principal | ICD-10-CM

## 2022-07-02 DIAGNOSIS — E119 Type 2 diabetes mellitus without complications: Secondary | ICD-10-CM | POA: Diagnosis not present

## 2022-07-02 DIAGNOSIS — Z87891 Personal history of nicotine dependence: Secondary | ICD-10-CM | POA: Diagnosis not present

## 2022-07-02 DIAGNOSIS — Z79899 Other long term (current) drug therapy: Secondary | ICD-10-CM | POA: Diagnosis not present

## 2022-07-02 DIAGNOSIS — I110 Hypertensive heart disease with heart failure: Secondary | ICD-10-CM | POA: Diagnosis not present

## 2022-07-02 DIAGNOSIS — E876 Hypokalemia: Secondary | ICD-10-CM | POA: Diagnosis not present

## 2022-07-02 DIAGNOSIS — R001 Bradycardia, unspecified: Secondary | ICD-10-CM | POA: Diagnosis present

## 2022-07-02 DIAGNOSIS — Z8673 Personal history of transient ischemic attack (TIA), and cerebral infarction without residual deficits: Secondary | ICD-10-CM

## 2022-07-02 DIAGNOSIS — I5032 Chronic diastolic (congestive) heart failure: Secondary | ICD-10-CM | POA: Diagnosis present

## 2022-07-02 DIAGNOSIS — Z634 Disappearance and death of family member: Secondary | ICD-10-CM

## 2022-07-02 DIAGNOSIS — E1165 Type 2 diabetes mellitus with hyperglycemia: Secondary | ICD-10-CM

## 2022-07-02 DIAGNOSIS — I10 Essential (primary) hypertension: Secondary | ICD-10-CM | POA: Diagnosis not present

## 2022-07-02 DIAGNOSIS — F430 Acute stress reaction: Principal | ICD-10-CM | POA: Diagnosis present

## 2022-07-02 DIAGNOSIS — Z951 Presence of aortocoronary bypass graft: Secondary | ICD-10-CM

## 2022-07-02 DIAGNOSIS — J45909 Unspecified asthma, uncomplicated: Secondary | ICD-10-CM | POA: Diagnosis not present

## 2022-07-02 DIAGNOSIS — Z841 Family history of disorders of kidney and ureter: Secondary | ICD-10-CM | POA: Diagnosis not present

## 2022-07-02 DIAGNOSIS — M1990 Unspecified osteoarthritis, unspecified site: Secondary | ICD-10-CM | POA: Diagnosis not present

## 2022-07-02 DIAGNOSIS — Z7902 Long term (current) use of antithrombotics/antiplatelets: Secondary | ICD-10-CM | POA: Diagnosis not present

## 2022-07-02 DIAGNOSIS — Z888 Allergy status to other drugs, medicaments and biological substances status: Secondary | ICD-10-CM

## 2022-07-02 DIAGNOSIS — I2510 Atherosclerotic heart disease of native coronary artery without angina pectoris: Secondary | ICD-10-CM | POA: Diagnosis present

## 2022-07-02 DIAGNOSIS — Z880 Allergy status to penicillin: Secondary | ICD-10-CM | POA: Diagnosis not present

## 2022-07-02 DIAGNOSIS — I21A1 Myocardial infarction type 2: Secondary | ICD-10-CM | POA: Diagnosis present

## 2022-07-02 DIAGNOSIS — R0789 Other chest pain: Secondary | ICD-10-CM | POA: Diagnosis not present

## 2022-07-02 HISTORY — DX: Hypomagnesemia: E83.42

## 2022-07-02 LAB — MAGNESIUM: Magnesium: 1.5 mg/dL — ABNORMAL LOW (ref 1.7–2.4)

## 2022-07-02 LAB — CBC WITH DIFFERENTIAL/PLATELET
Abs Immature Granulocytes: 0 10*3/uL (ref 0.00–0.07)
Basophils Absolute: 0 10*3/uL (ref 0.0–0.1)
Basophils Relative: 1 %
Eosinophils Absolute: 0 10*3/uL (ref 0.0–0.5)
Eosinophils Relative: 0 %
HCT: 37.1 % (ref 36.0–46.0)
Hemoglobin: 12.6 g/dL (ref 12.0–15.0)
Immature Granulocytes: 0 %
Lymphocytes Relative: 44 %
Lymphs Abs: 2.1 10*3/uL (ref 0.7–4.0)
MCH: 29.1 pg (ref 26.0–34.0)
MCHC: 34 g/dL (ref 30.0–36.0)
MCV: 85.7 fL (ref 80.0–100.0)
Monocytes Absolute: 0.4 10*3/uL (ref 0.1–1.0)
Monocytes Relative: 8 %
Neutro Abs: 2.2 10*3/uL (ref 1.7–7.7)
Neutrophils Relative %: 47 %
Platelets: 264 10*3/uL (ref 150–400)
RBC: 4.33 MIL/uL (ref 3.87–5.11)
RDW: 12.5 % (ref 11.5–15.5)
WBC: 4.8 10*3/uL (ref 4.0–10.5)
nRBC: 0 % (ref 0.0–0.2)

## 2022-07-02 LAB — TROPONIN I (HIGH SENSITIVITY)
Troponin I (High Sensitivity): 41 ng/L — ABNORMAL HIGH (ref <18)
Troponin I (High Sensitivity): 131 ng/L (ref <18)
Troponin I (High Sensitivity): 189 ng/L (ref <18)

## 2022-07-02 LAB — BASIC METABOLIC PANEL
Anion gap: 11 (ref 5–15)
BUN: 10 mg/dL (ref 8–23)
CO2: 26 mmol/L (ref 22–32)
Calcium: 9.1 mg/dL (ref 8.9–10.3)
Chloride: 101 mmol/L (ref 98–111)
Creatinine, Ser: 1.13 mg/dL — ABNORMAL HIGH (ref 0.44–1.00)
GFR, Estimated: 55 mL/min — ABNORMAL LOW (ref >60)
Glucose, Bld: 130 mg/dL — ABNORMAL HIGH (ref 70–99)
Potassium: 2.5 mmol/L — CL (ref 3.5–5.1)
Sodium: 138 mmol/L (ref 135–145)

## 2022-07-02 MED ORDER — HEPARIN (PORCINE) 25000 UT/250ML-% IV SOLN
650.0000 [IU]/h | INTRAVENOUS | Status: DC
  Administered 2022-07-02: 650 [IU]/h via INTRAVENOUS
  Filled 2022-07-02: qty 250

## 2022-07-02 MED ORDER — PROPRANOLOL HCL 20 MG PO TABS
10.0000 mg | ORAL_TABLET | Freq: Once | ORAL | Status: DC
  Filled 2022-07-02: qty 1

## 2022-07-02 MED ORDER — DIAZEPAM 5 MG PO TABS
5.0000 mg | ORAL_TABLET | Freq: Once | ORAL | Status: AC
  Administered 2022-07-02: 5 mg via ORAL
  Filled 2022-07-02: qty 1

## 2022-07-02 MED ORDER — NITROGLYCERIN 0.4 MG SL SUBL
0.4000 mg | SUBLINGUAL_TABLET | SUBLINGUAL | Status: DC | PRN
  Administered 2022-07-03 (×2): 0.4 mg via SUBLINGUAL
  Filled 2022-07-02: qty 1

## 2022-07-02 MED ORDER — ONDANSETRON HCL 4 MG/2ML IJ SOLN
4.0000 mg | Freq: Four times a day (QID) | INTRAMUSCULAR | Status: DC | PRN
  Administered 2022-07-02: 4 mg via INTRAVENOUS
  Filled 2022-07-02: qty 2

## 2022-07-02 MED ORDER — POTASSIUM CHLORIDE 10 MEQ/100ML IV SOLN
10.0000 meq | INTRAVENOUS | Status: DC
Start: 2022-07-02 — End: 2022-07-02

## 2022-07-02 MED ORDER — PANTOPRAZOLE SODIUM 40 MG PO TBEC
40.0000 mg | DELAYED_RELEASE_TABLET | Freq: Every day | ORAL | Status: DC
  Administered 2022-07-02 – 2022-07-04 (×3): 40 mg via ORAL
  Filled 2022-07-02 (×3): qty 1

## 2022-07-02 MED ORDER — POTASSIUM CHLORIDE 10 MEQ/100ML IV SOLN
10.0000 meq | INTRAVENOUS | Status: AC
  Administered 2022-07-02 (×4): 10 meq via INTRAVENOUS
  Filled 2022-07-02 (×2): qty 100

## 2022-07-02 MED ORDER — ATORVASTATIN CALCIUM 80 MG PO TABS
80.0000 mg | ORAL_TABLET | Freq: Every day | ORAL | Status: DC
  Administered 2022-07-02 – 2022-07-04 (×3): 80 mg via ORAL
  Filled 2022-07-02: qty 4
  Filled 2022-07-02: qty 1
  Filled 2022-07-02: qty 4

## 2022-07-02 MED ORDER — MAGNESIUM SULFATE 2 GM/50ML IV SOLN
2.0000 g | Freq: Once | INTRAVENOUS | Status: AC
  Administered 2022-07-02: 2 g via INTRAVENOUS
  Filled 2022-07-02: qty 50

## 2022-07-02 MED ORDER — ONDANSETRON HCL 4 MG PO TABS: 4.0000 mg | ORAL_TABLET | Freq: Four times a day (QID) | ORAL | Status: DC | PRN

## 2022-07-02 MED ORDER — LOSARTAN POTASSIUM 50 MG PO TABS
100.0000 mg | ORAL_TABLET | Freq: Every day | ORAL | Status: DC
  Administered 2022-07-02 – 2022-07-04 (×3): 100 mg via ORAL
  Filled 2022-07-02 (×3): qty 2

## 2022-07-02 MED ORDER — POTASSIUM CHLORIDE CRYS ER 20 MEQ PO TBCR
40.0000 meq | EXTENDED_RELEASE_TABLET | Freq: Once | ORAL | Status: AC
  Administered 2022-07-02: 40 meq via ORAL
  Filled 2022-07-02: qty 2

## 2022-07-02 MED ORDER — HEPARIN BOLUS VIA INFUSION
3400.0000 [IU] | Freq: Once | INTRAVENOUS | Status: AC
Start: 2022-07-02 — End: 2022-07-02
  Administered 2022-07-02: 3400 [IU] via INTRAVENOUS
  Filled 2022-07-02: qty 3400

## 2022-07-02 MED ORDER — ASPIRIN 81 MG PO CHEW
324.0000 mg | CHEWABLE_TABLET | Freq: Once | ORAL | Status: AC
  Administered 2022-07-02: 324 mg via ORAL
  Filled 2022-07-02: qty 4

## 2022-07-02 MED ORDER — CLOPIDOGREL BISULFATE 75 MG PO TABS
75.0000 mg | ORAL_TABLET | Freq: Every day | ORAL | Status: DC
  Administered 2022-07-02 – 2022-07-03 (×2): 75 mg via ORAL
  Filled 2022-07-02 (×3): qty 1

## 2022-07-02 NOTE — ED Triage Notes (Signed)
First Nurse Note;  Pt via EMS from home. States she found her sister dead, and then started having some mid-sternal chest pain. Pt states she has some blockage and has a follow-up at the end of August. Pt has a hx of triple bypass in 2018 and MI in 2019. Pt is A&Ox4 and NAD.   EMS gave 324 ASA 163/90 BP  67 HR  99% on RA 12-lead unremarkable per EMS

## 2022-07-02 NOTE — ED Provider Triage Note (Signed)
  Emergency Medicine Provider Triage Evaluation Note  Kayla Malone , a 61 y.o.female,  was evaluated in triage.  Pt complains of chest pain.  Patient states this started this morning after she found her sister dead.  Reports midsternal chest pain with some shortness of breath.  She is extremely anxious and upset at this time.  She was given 324 mg aspirin en route by EMS.   Review of Systems  Positive: Chest pain, shortness of breath Negative: Denies fever, abdominal pain, vomiting  Physical Exam   Vitals:   07/02/22 1206  BP: (!) 159/83  Pulse: (!) 53  Resp: 20  Temp: 98.8 F (37.1 C)  SpO2: 99%   Gen:   Awake, extremely distressed and tearful Resp:  Normal effort  MSK:   Moves extremities without difficulty  Other:    Medical Decision Making  Given the patient's initial medical screening exam, the following diagnostic evaluation has been ordered. The patient will be placed in the appropriate treatment space, once one is available, to complete the evaluation and treatment. I have discussed the plan of care with the patient and I have advised the patient that an ED physician or mid-level practitioner will reevaluate their condition after the test results have been received, as the results may give them additional insight into the type of treatment they may need.    Diagnostics: Labs, EKG, CXR  Treatments: none immediately   Teodoro Spray, [name] 07/02/22 1207

## 2022-07-02 NOTE — Progress Notes (Addendum)
[place] for heparin Indication: chest pain/ACS  Allergies  Allergen Reactions   Lisinopril Swelling   Tramadol Other (See Comments) and Nausea Only    sleepiness Other reaction(s): Dizziness, Other, Other (See Comments) somnolence sleepiness Other reaction(s): Other (See Comments), Other (See Comments) Would not wake up after taking this medication somnolence sleepiness sleepiness    Amoxicillin Rash    Don't remember  Don't remember  Don't remember    Clavulanic Acid Rash    Patient Measurements: Height: '5\' 3"'$  (160 cm) Weight: 56.7 kg (125 lb) IBW/kg (Calculated) : 52.4 Heparin Dosing Weight: 56.7 kg   Vital Signs: Temp: 98.8 F (37.1 C) (07/22 1206) BP: 168/84 (07/22 1630) Pulse Rate: 51 (07/22 1630)  Labs: Recent Labs    07/02/22 1209 07/02/22 1644  HGB 12.6  --   HCT 37.1  --   PLT 264  --   CREATININE 1.13*  --   TROPONINIHS 41* 131*    Estimated Creatinine Clearance: 43.2 mL/min (A) (by C-G formula based on SCr of 1.13 mg/dL (H)).   Medical History: Past Medical History:  Diagnosis Date   Asthma    Coronary artery disease    Depression    Diabetes mellitus without complication ([facility])    GERD (gastroesophageal reflux disease)    History of hiatal hernia    Hypertension    Pain    BACK   Stroke [facility])    x3   Thyroid cyst     Assessment: 61 y.o. female with h/o CAD and prior MI (2019) presenting with chest pain and elevated troponin that is trending up; found to have ACS. Pharmacy has been consulted for IV heparin dosing.   Hgb and platelets WNL. No overt bleeding noted. No prior to admission anticoagulation noted. Baseline PT/INR, CBC, and aPTT ordered.  Goal of Therapy:  Heparin level 0.3-0.7 units/ml Monitor platelets by anticoagulation protocol: Yes  Plan:  Heparin bolus 3400 units IV x 1 Then begin heparin infusion at 650 units/hr Check heparin level 6 hours after start of  infusion Monitor CBC, daily heparin level  Continue to monitor for signs/symptoms of bleeding   Brendolyn Patty, PharmD Clinical Pharmacist  02/28/2021   5:13 PM

## 2022-07-02 NOTE — ED Provider Notes (Signed)
[facility] Provider Note    Event Date/Time   First MD Initiated Contact with Patient 07/02/22 1522     (approximate)   History   Chest Pain   HPI  Kayla Malone is a 61 y.o. female presents the ER for evaluation of chest pain and tightness that started this morning.  Patient unfortunately found her sister dead this morning and has felt very anxious and started having chest pain immediately after finding her.  Patient does have a history of CAD.  No nausea or vomiting.  Denies any fevers or chills no cough or congestion.   He has had some intermittent clamminess and diaphoresis.     Physical Exam   Triage Vital Signs: ED Triage Vitals  Enc Vitals Group     BP 07/02/22 1206 (!) 159/83     Pulse Rate 07/02/22 1206 (!) 53     Resp 07/02/22 1206 20     Temp 07/02/22 1206 98.8 F (37.1 C)     Temp src --      SpO2 07/02/22 1206 99 %     Weight 07/02/22 1207 125 lb (56.7 kg)     Height 07/02/22 1207 '5\' 3"'$  (1.6 m)     Head Circumference --      Peak Flow --      Pain Score 07/02/22 1207 0     Pain Loc --      Pain Edu? --      Excl. in [place]? --     Most recent vital signs: Vitals:   07/02/22 1600 07/02/22 1630  BP: (!) 146/84 (!) 168/84  Pulse: (!) 51 (!) 51  Resp: 17 18  Temp:    SpO2: 99% 100%     Constitutional: Alert  Eyes: Conjunctivae are normal.  Head: Atraumatic. Nose: No congestion/rhinnorhea. Mouth/Throat: Mucous membranes are moist.   Neck: Painless ROM.  Cardiovascular:   Good peripheral circulation. Respiratory: Normal respiratory effort.  No retractions.  Gastrointestinal: Soft and nontender.  Musculoskeletal:  no deformity Neurologic:  MAE spontaneously. No gross focal neurologic deficits are appreciated.  Skin:  Skin is warm, dry and intact. No rash noted. Psychiatric: Mood and affect are normal. Speech and behavior are normal.    ED Results / Procedures / Treatments   Labs (all labs ordered are listed,  but only abnormal results are displayed) Labs Reviewed  BASIC METABOLIC PANEL - Abnormal; Notable for the following components:      Result Value   Potassium 2.5 (*)    Glucose, Bld 130 (*)    Creatinine, Ser 1.13 (*)    GFR, Estimated 55 (*)    All other components within normal limits  MAGNESIUM - Abnormal; Notable for the following components:   Magnesium 1.5 (*)    All other components within normal limits  TROPONIN I (HIGH SENSITIVITY) - Abnormal; Notable for the following components:   Troponin I (High Sensitivity) 41 (*)    All other components within normal limits  TROPONIN I (HIGH SENSITIVITY) - Abnormal; Notable for the following components:   Troponin I (High Sensitivity) 131 (*)    All other components within normal limits  CBC WITH DIFFERENTIAL/PLATELET     EKG  ED ECG REPORT I, Merlyn Lot, the attending physician, personally viewed and interpreted this ECG.   Date: 07/02/2022  EKG Time: 12:02  Rate: 60  Rhythm: sinus  Axis: normal  Intervals: normal  ST&T Change: no stemi, no depression    RADIOLOGY  Please see ED Course for my review and interpretation.  I personally reviewed all radiographic images ordered to evaluate for the above acute complaints and reviewed radiology reports and findings.  These findings were personally discussed with the patient.  Please see medical record for radiology report.    PROCEDURES:  Critical Care performed: Yes, see critical care procedure note(s)  .Critical Care  Performed by: Merlyn Lot, MD Authorized by: Merlyn Lot, MD   Critical care provider statement:    Critical care time (minutes):  35   Critical care was necessary to treat or prevent imminent or life-threatening deterioration of the following conditions:  Cardiac failure   Critical care was time spent personally by me on the following activities:  Ordering and performing treatments and interventions, ordering and review of laboratory  studies, ordering and review of radiographic studies, pulse oximetry, re-evaluation of patient's condition, review of old charts, obtaining history from patient or surrogate, examination of patient, evaluation of patient's response to treatment, discussions with primary provider, discussions with consultants and development of treatment plan with patient or surrogate    MEDICATIONS ORDERED IN ED: Medications  potassium chloride 10 mEq in 100 mL IVPB (10 mEq Intravenous New Bag/Given 07/02/22 1643)  propranolol (INDERAL) tablet 10 mg (10 mg Oral Not Given 07/02/22 1643)  magnesium sulfate IVPB 2 g 50 mL (2 g Intravenous New Bag/Given 07/02/22 1643)  potassium chloride SA (KLOR-CON M) CR tablet 40 mEq (40 mEq Oral Given 07/02/22 1620)  diazepam (VALIUM) tablet 5 mg (5 mg Oral Given 07/02/22 1620)  aspirin chewable tablet 324 mg (324 mg Oral Given 07/02/22 1620)     IMPRESSION / MDM / [place] / ED COURSE  I reviewed the triage vital signs and the nursing notes.                              Differential diagnosis includes, but is not limited to, ACS, pericarditis, esophagitis, boerhaaves, pe, dissection, pna, bronchitis, costochondritis  Patient presented to the for evaluation of symptoms as described above.  This presenting complaint could reflect a potentially life-threatening illness therefore the patient will be placed on continuous pulse oximetry and telemetry for monitoring.  Laboratory evaluation will be sent to evaluate for the above complaints.  I have a lower suspicion for PE or dissection lower suspicion for infectious process.  Possible stress cardiomyopathy given her history of CAD certainly concerning for ACS.  EKG currently is nonischemic initial troponin is borderline elevated we will follow-up remainder of labs keep on cardiac monitor and reassess.    Clinical Course as of 07/02/22 1731  Sat Jul 02, 2022  1600 Patient magnesium level also acutely low we will give IV  magnesium.  Chest x-ray on my review and interpretation does not show any evidence of pneumothorax. [PR]  1728 Patient's troponin is rising.  She is currently pain-free right now her blood pressure is 120/80.  I will heparinize.  I will consult hospitalist for admission [PR]    Clinical Course User Index [PR] Merlyn Lot, MD      FINAL CLINICAL IMPRESSION(S) / ED DIAGNOSES   Final diagnoses:  Nonspecific chest pain  Hypokalemia  Hypomagnesemia     Rx / DC Orders   ED Discharge Orders     None        Note:  This document was prepared using Dragon voice recognition software and may include unintentional dictation errors.  Merlyn Lot, MD 07/02/22 [phone]

## 2022-07-02 NOTE — H&P (Addendum)
History and Physical    Patient: Kayla Malone FGH:[id number] DOB: [birth year] DOA: 07/02/2022 DOS: the patient was seen and examined on 07/02/2022 PCP: Perrin Maltese, MD  Patient coming from: Home  Chief Complaint:  Chief Complaint  Patient presents with   Chest Pain   HPI: Kayla Malone is a 61 y.o. female with medical history significant of hypertension, hyperlipidemia, CAD s/p CABG (10/2017) and PCI (09/2018), arthritis, asthma, peptic ulcer disease who presents to the ED emergency department via EMS from home for chest tightness and chest pain.  This morning, patient found that her sister just died and she developed chest pain.  Chest pain/tightness was midsternal with radiation to the bilateral sides of neck and posterior part of neck.  Pain was of moderate to severe intensity with no known aggravating factor other than the news about deceased Sister received this morning.  She states that her cardiologist told her that she has some blockage and she has a follow-up at the end of August.  She denies fever, chills, headache, nausea, vomiting, diarrhea or constipation.   ED Course:  In the emergency department, she was bradycardic, BP was 146/84, other vital signs were within normal range.  Work-up in the ED showed normal CBC and BMP except for hypokalemia and creatinine 1.13 (creatinine is within baseline range).,  Troponin x2 - 41 > 131, magnesium 1.5. Chest x-ray showed no active cardiopulmonary disease Aspirin was given, Valium 5 mg p.o. x1 was given.  Patient was started on heparin drip, potassium and magnesium were replenished.  Hospitalist was asked to admit patient for further evaluation and management   Review of Systems: Review of systems as noted in the HPI. All other systems reviewed and are negative.   Past Medical History:  Diagnosis Date   Asthma    Coronary artery disease    Depression    Diabetes mellitus without complication (HCC)    GERD  (gastroesophageal reflux disease)    History of hiatal hernia    Hypertension    Pain    BACK   Stroke [facility])    x3   Thyroid cyst    Past Surgical History:  Procedure Laterality Date   ABDOMINAL HYSTERECTOMY     CARDIAC SURGERY     CATARACT EXTRACTION W/PHACO Left 08/15/2017   Procedure: CATARACT EXTRACTION PHACO AND INTRAOCULAR LENS PLACEMENT ([place]);  Surgeon: Birder Robson, MD;  Location: [facility] ORS;  Service: Ophthalmology;  Laterality: Left;  [place] 00:40.6 AP% 7.3 CDE 2.98 FLUID PACK LOT # [id number]   CORONARY ARTERY BYPASS GRAFT N/A 10/23/2017   Procedure: CORONARY ARTERY BYPASS GRAFTING (CABG) x two  , using left internal mammary artery and right leg greater saphenous vein harvested  endoscopically - LIMA to LAD, SVG to RCA;  Surgeon: Grace Isaac, MD;  Location: [place];  Service: Open Heart Surgery;  Laterality: N/A;   CORONARY STENT INTERVENTION N/A 09/24/2018   Procedure: CORONARY STENT INTERVENTION;  Surgeon: Jettie Booze, MD;  Location: [place] [facility];  Service: Cardiovascular;  Laterality: N/A;   ESOPHAGOGASTRODUODENOSCOPY N/A 05/30/2019   Procedure: ESOPHAGOGASTRODUODENOSCOPY (EGD);  Surgeon: Tol[place], Benay Pike, MD;  Location: [facility] ENDOSCOPY;  Service: Gastroenterology;  Laterality: N/A;   EYE SURGERY     LEFT HEART CATH AND CORONARY ANGIOGRAPHY Left 10/09/2017   Procedure: LEFT HEART CATH AND CORONARY ANGIOGRAPHY;  Surgeon: Dionisio [name], MD;  Location: [place] [facility];  Service: Cardiovascular;  Laterality: Left;   LEFT HEART CATH AND CORS/GRAFTS ANGIOGRAPHY  N/A 09/24/2018   Procedure: LEFT HEART CATH AND CORS/GRAFTS ANGIOGRAPHY;  Surgeon: Jettie Booze, MD;  Location: [place] [facility];  Service: Cardiovascular;  Laterality: N/A;   TEE WITHOUT CARDIOVERSION N/A 10/23/2017   Procedure: TRANSESOPHAGEAL ECHOCARDIOGRAM (TEE);  Surgeon: Grace Isaac, MD;  Location: [place];  Service: Open Heart Surgery;  Laterality: N/A;    Social History:   reports that she quit smoking about 23 years ago. Her smoking use included cigarettes. She has never used smokeless tobacco. She reports that she does not drink alcohol and does not use drugs.   Allergies  Allergen Reactions   Lisinopril Swelling   Tramadol Other (See Comments) and Nausea Only    sleepiness Other reaction(s): Dizziness, Other, Other (See Comments) somnolence sleepiness Other reaction(s): Other (See Comments), Other (See Comments) Would not wake up after taking this medication somnolence sleepiness sleepiness    Amoxicillin Rash    Don't remember  Don't remember  Don't remember    Clavulanic Acid Rash    Family History  Problem Relation Age of Onset   Heart failure Mother    Pulmonary embolism Mother    Cirrhosis Father    Coronary artery disease Father    Diabetes type II Father    Hypertension Father    Kidney disease Father    Coronary artery disease Sister    Stroke Sister    COPD Brother    Coronary artery disease Brother    Breast cancer Neg Hx      Prior to Admission medications   Medication Sig Start Date End Date Taking? Authorizing Provider  atorvastatin (LIPITOR) 80 MG tablet Take 80 mg by mouth daily. 06/18/22  Yes [provider]  chlorthalidone (HYGROTON) 25 MG tablet Chlorthalidone 25 MG Oral Tablet QTY: 90 tablet Days: 90 Refills: 2  Written: 04/09/20 Patient Instructions: once a day 04/09/20  Yes [provider]  clopidogrel (PLAVIX) 75 MG tablet Take by mouth. 10/07/19  Yes [provider]  fluticasone (FLONASE) 50 MCG/ACT nasal spray Place 2 sprays into both nostrils daily.   Yes [provider]  losartan (COZAAR) 100 MG tablet Take 100 mg by mouth daily. 06/29/18  Yes [provider]  pantoprazole (PROTONIX) 40 MG tablet Take 40 mg by mouth.    Yes [provider]  Accu-Chek Softclix Lancets lancets  09/23/20   [provider]  acetaminophen (TYLENOL) 500 MG tablet Take  1,000 mg by mouth every 6 (six) hours as needed for mild pain or headache.    [provider]  amLODipine (NORVASC) 5 MG tablet Take by mouth. Patient not taking: Reported on 07/02/2022    [provider]  aspirin EC 81 MG tablet Take 81 mg by mouth daily. Patient not taking: Reported on 07/02/2022    [provider]  Bempedoic Acid-Ezetimibe (NEXLIZET) 180-10 MG TABS Take 1 tablet by mouth daily. Patient not taking: Reported on 07/02/2022 08/21/20   [provider]  Blood Glucose Monitoring Suppl (ACCU-CHEK GUIDE ME) w/Device KIT  09/23/20   [provider]  Fluticasone-Salmeterol (ADVAIR) 500-50 MCG/DOSE AEPB Inhale 1 puff into the lungs daily as needed (SOB).  Patient not taking: Reported on 07/02/2022    [provider]  glucose blood (ACCU-CHEK GUIDE) test strip Blood Glucose Test In Vitro Strip QTY: 100 strip Days: 90 Refills: 3  Written: 06/07/19 Patient Instructions: E11.9 - to check blodd sugars once daily 06/07/19   [provider]  isosorbide mononitrate (IMDUR) 30 MG 24  hr tablet Take 30 mg by mouth daily. Patient not taking: Reported on 07/02/2022 01/31/19   [provider]  metoprolol succinate (TOPROL-XL) 50 MG 24 hr tablet Take 50 mg by mouth daily. Take with or immediately following a meal. Patient not taking: Reported on 07/02/2022    [provider]  Multiple Minerals-Vitamins (CITRACAL PLUS) TABS Magnesium 250 MG Oral Tablet QTY: 0 tablet Days: 0 Refills: 0  Written: 08/15/19 Patient Instructions: Patient not taking: Reported on 07/02/2022 08/15/19   [provider]  nitroGLYCERIN (NITROSTAT) 0.4 MG SL tablet Place 1 tablet (0.4 mg total) under the tongue every 5 (five) minutes x 3 doses as needed for chest pain. 09/25/18   Tommie Raymond, NP  prochlorperazine (COMPAZINE) 25 MG suppository Place 1 suppository (25 mg total) rectally every 8 (eight) hours as needed for nausea or vomiting. Patient not  taking: Reported on 07/02/2022 06/11/20   Nance Pear, MD  promethazine (PHENERGAN) 12.5 MG tablet  10/01/20   [provider]  tetracycline (SUMYCIN) 500 MG capsule Tetracycline HCl 500 MG Oral Capsule QTY: 56 capsule Days: 14 Refills: 0  Written: 06/05/20 Patient Instructions: four times a day Patient not taking: Reported on 07/02/2022 06/05/20   [provider]  traZODone (DESYREL) 50 MG tablet Take 50 mg by mouth at bedtime. Patient not taking: Reported on 04/11/2022 02/19/17   [provider]  VOLTAREN 1 % GEL Apply 2 g topically as needed (pain in shoulder).  Patient not taking: Reported on 07/02/2022 11/07/17   [provider]    Physical Exam: BP 130/65   Pulse (!) 50   Temp 98.8 F (37.1 C)   Resp 17   Ht $R'5\' 3"'Xz$  (1.6 m)   Wt 56.7 kg   SpO2 98%   BMI 22.14 kg/m   General: 61 y.o. year-old female well developed well nourished in no acute distress.  Alert and oriented x3. HEENT: NCAT, EOMI Neck: Supple, trachea medial Cardiovascular: Bradycardia.  Regular rate and rhythm with no rubs or gallops.  No thyromegaly or JVD noted.  No lower extremity edema. 2/4 pulses in all 4 extremities. Respiratory: Clear to auscultation with no wheezes or rales. Good inspiratory effort. Abdomen: Soft, nontender nondistended with normal bowel sounds x4 quadrants. Muskuloskeletal: No cyanosis, clubbing or edema noted bilaterally Neuro: CN II-XII intact, strength 5/5 x 4, sensation, reflexes intact Skin: No ulcerative lesions noted or rashes Psychiatry: Judgement and insight appear normal. Mood is appropriate for condition and setting          Labs on Admission:  Basic Metabolic Panel: Recent Labs  Lab 07/02/22 1209  NA 138  K 2.5*  CL 101  CO2 26  GLUCOSE 130*  BUN 10  CREATININE 1.13*  CALCIUM 9.1  MG 1.5*   Liver Function Tests: No results for input(s): "AST", "ALT", "ALKPHOS", "BILITOT", "PROT", "ALBUMIN" in the last 168 hours. No results for  input(s): "LIPASE", "AMYLASE" in the last 168 hours. No results for input(s): "AMMONIA" in the last 168 hours. CBC: Recent Labs  Lab 07/02/22 1209  WBC 4.8  NEUTROABS 2.2  HGB 12.6  HCT 37.1  MCV 85.7  PLT 264   Cardiac Enzymes: No results for input(s): "CKTOTAL", "CKMB", "CKMBINDEX", "TROPONINI" in the last 168 hours.  BNP (last 3 results) No results for input(s): "BNP" in the last 8760 hours.  ProBNP (last 3 results) No results for input(s): "PROBNP" in the last 8760 hours.  CBG: No results for input(s): "GLUCAP" in the last 168  hours.  Radiological Exams on Admission: DG Chest 2 View  Result Date: 07/02/2022 CLINICAL DATA:  Chest pain beginning this morning with some shortness-of-breath. EXAM: CHEST - 2 VIEW COMPARISON:  10/30/2020 FINDINGS: Lungs are adequately inflated without focal airspace consolidation or effusion. Cardiomediastinal silhouette and remainder of the exam is unchanged. IMPRESSION: No active cardiopulmonary disease. Electronically Signed   By: Marin Olp M.D.   On: 07/02/2022 12:27    EKG: I independently viewed the EKG done and my findings are as followed: Normal sinus rhythm at rate of 62 bpm  Assessment/Plan Present on Admission:  NSTEMI (non-ST elevated myocardial infarction) ([place])  Hypokalemia  GERD (gastroesophageal reflux disease)  Coronary artery disease  Hyperlipidemia, unspecified  Principal Problem:   NSTEMI (non-ST elevated myocardial infarction) ([place]) Active Problems:   Hyperlipidemia, unspecified   GERD (gastroesophageal reflux disease)   Coronary artery disease   Hypokalemia   Hypomagnesemia   Peptic ulcer disease   Arthritis   Atypical Chest Pain/NSTEMI Cardiovascular risk factors include pretension, hyperlipidemia, T2DM, CAD  Continue telemetry  Troponins x2 - 41 > 131 EKG showed normal sinus rhythm at rate of 62 bpm Aspirin 325 mg x 1 was given, continue aspirin Cardiology will be consulted to help decide if Stress  test is needed in am Versus other  diagnostic modalities.     Hypokalemia K+ is 2.5 K+ will be replenished Please monitor for AM K+ for further replenishmemnt  Hypomagnesemia Mg level is 1.5 This will be replenished Please continue to monitor Mg level and correct accordingly  Essential hypertension Continue losartan  Mixed hyperlipidemia Continue Lipitor  Arthritis Continue Tylenol, Voltaren  CAD s/p PCI/CABG Continue Plavix, Lipitor, nitroglycerin as needed  GERD/history of peptic ulcer disease Continue Protonix  DVT prophylaxis: Heparin drip  Code Status: Full code  Consults: Cardiology  Family Communication: None at bedside  Severity of Illness: The appropriate patient status for this patient is INPATIENT. Inpatient status is judged to be reasonable and necessary in order to provide the required intensity of service to ensure the patient's safety. The patient's presenting symptoms, physical exam findings, and initial radiographic and laboratory data in the context of their chronic comorbidities is felt to place them at high risk for further clinical deterioration. Furthermore, it is not anticipated that the patient will be medically stable for discharge from the hospital within 2 midnights of admission.   * I certify that at the point of admission it is my clinical judgment that the patient will require inpatient hospital care spanning beyond 2 midnights from the point of admission due to high intensity of service, high risk for further deterioration and high frequency of surveillance required.*  Author: Bernadette Hoit, DO 07/02/2022 9:03 PM  For on call review [email or web address].

## 2022-07-03 ENCOUNTER — Other Ambulatory Visit: Payer: Self-pay

## 2022-07-03 DIAGNOSIS — E876 Hypokalemia: Secondary | ICD-10-CM | POA: Diagnosis not present

## 2022-07-03 DIAGNOSIS — E782 Mixed hyperlipidemia: Secondary | ICD-10-CM | POA: Diagnosis not present

## 2022-07-03 DIAGNOSIS — I214 Non-ST elevation (NSTEMI) myocardial infarction: Secondary | ICD-10-CM | POA: Diagnosis not present

## 2022-07-03 LAB — CBC WITH DIFFERENTIAL/PLATELET
Abs Immature Granulocytes: 0.01 10*3/uL (ref 0.00–0.07)
Basophils Absolute: 0 10*3/uL (ref 0.0–0.1)
Basophils Relative: 1 %
Eosinophils Absolute: 0 10*3/uL (ref 0.0–0.5)
Eosinophils Relative: 1 %
HCT: 35.6 % — ABNORMAL LOW (ref 36.0–46.0)
Hemoglobin: 12 g/dL (ref 12.0–15.0)
Immature Granulocytes: 0 %
Lymphocytes Relative: 52 %
Lymphs Abs: 2.3 10*3/uL (ref 0.7–4.0)
MCH: 28.8 pg (ref 26.0–34.0)
MCHC: 33.7 g/dL (ref 30.0–36.0)
MCV: 85.6 fL (ref 80.0–100.0)
Monocytes Absolute: 0.4 10*3/uL (ref 0.1–1.0)
Monocytes Relative: 9 %
Neutro Abs: 1.5 10*3/uL — ABNORMAL LOW (ref 1.7–7.7)
Neutrophils Relative %: 37 %
Platelets: 239 10*3/uL (ref 150–400)
RBC: 4.16 MIL/uL (ref 3.87–5.11)
RDW: 12.6 % (ref 11.5–15.5)
WBC: 4.2 10*3/uL (ref 4.0–10.5)
nRBC: 0 % (ref 0.0–0.2)

## 2022-07-03 LAB — HEPARIN LEVEL (UNFRACTIONATED)
Heparin Unfractionated: 0.5 IU/mL (ref 0.30–0.70)
Heparin Unfractionated: 0.4 IU/mL (ref 0.30–0.70)

## 2022-07-03 LAB — BASIC METABOLIC PANEL
Anion gap: 9 (ref 5–15)
BUN: 10 mg/dL (ref 8–23)
CO2: 25 mmol/L (ref 22–32)
Calcium: 8.8 mg/dL — ABNORMAL LOW (ref 8.9–10.3)
Chloride: 107 mmol/L (ref 98–111)
Creatinine, Ser: 1.03 mg/dL — ABNORMAL HIGH (ref 0.44–1.00)
GFR, Estimated: >60 mL/min (ref >60)
Glucose, Bld: 93 mg/dL (ref 70–99)
Potassium: 3.2 mmol/L — ABNORMAL LOW (ref 3.5–5.1)
Sodium: 141 mmol/L (ref 135–145)

## 2022-07-03 LAB — PROTIME-INR
INR: 1.1 (ref 0.8–1.2)
Prothrombin Time: 13.9 seconds (ref 11.4–15.2)

## 2022-07-03 LAB — HIV ANTIBODY (ROUTINE TESTING W REFLEX): HIV Screen 4th Generation wRfx: NONREACTIVE

## 2022-07-03 LAB — MAGNESIUM: Magnesium: 2.2 mg/dL (ref 1.7–2.4)

## 2022-07-03 LAB — TROPONIN I (HIGH SENSITIVITY)
Troponin I (High Sensitivity): 152 ng/L (ref <18)
Troponin I (High Sensitivity): 193 ng/L (ref <18)

## 2022-07-03 LAB — BRAIN NATRIURETIC PEPTIDE: B Natriuretic Peptide: 70.8 pg/mL (ref 0.0–100.0)

## 2022-07-03 LAB — PHOSPHORUS: Phosphorus: 3.2 mg/dL (ref 2.5–4.6)

## 2022-07-03 MED ORDER — POTASSIUM CHLORIDE CRYS ER 20 MEQ PO TBCR
40.0000 meq | EXTENDED_RELEASE_TABLET | Freq: Once | ORAL | Status: AC
  Administered 2022-07-03: 40 meq via ORAL

## 2022-07-03 MED ORDER — SODIUM CHLORIDE 0.9% FLUSH
3.0000 mL | Freq: Two times a day (BID) | INTRAVENOUS | Status: DC
Start: 2022-07-03 — End: 2022-07-04
  Administered 2022-07-03 – 2022-07-04 (×3): 3 mL via INTRAVENOUS

## 2022-07-03 MED ORDER — MELATONIN 5 MG PO TABS
5.0000 mg | ORAL_TABLET | Freq: Every evening | ORAL | Status: DC | PRN
Start: 2022-07-03 — End: 2022-07-04
  Administered 2022-07-03: 5 mg via ORAL
  Filled 2022-07-03: qty 1

## 2022-07-03 NOTE — Assessment & Plan Note (Signed)
Replacing as needed 

## 2022-07-03 NOTE — Assessment & Plan Note (Signed)
Continue statin. 

## 2022-07-03 NOTE — Assessment & Plan Note (Addendum)
Persistently elevated troponins in the context of significant stressor event.  Patient had a recent stress test noting diffuse vessel disease. Cardiac cath 07/04/22 "Demand ischemia with mildly elevated troponin.  LIMA to the LAD is patent SVG to PDA is occluded but right coronary stent from mid to distal RCA is without any significant disease.  Left circumflex is normal.  Ejection fraction is normal.  Advise medical therapy.  With high-dose statin aspirin and Plavix."

## 2022-07-03 NOTE — Assessment & Plan Note (Signed)
Continue PPI ?

## 2022-07-03 NOTE — Progress Notes (Signed)
[place] for heparin Indication: chest pain/ACS  Allergies  Allergen Reactions   Lisinopril Swelling   Tramadol Other (See Comments) and Nausea Only    sleepiness Other reaction(s): Dizziness, Other, Other (See Comments) somnolence sleepiness Other reaction(s): Other (See Comments), Other (See Comments) Would not wake up after taking this medication somnolence sleepiness sleepiness    Amoxicillin Rash    Don't remember  Don't remember  Don't remember    Clavulanic Acid Rash    Patient Measurements: Height: '5\' 3"'$  (160 cm) Weight: 56.7 kg (125 lb) IBW/kg (Calculated) : 52.4 Heparin Dosing Weight: 56.7 kg   Vital Signs: Temp: 97.7 F (36.5 C) (07/23 0119) Temp Source: Axillary (07/23 0119) BP: 114/73 (07/23 0730) Pulse Rate: 57 (07/23 0730)  Labs: Recent Labs    07/02/22 1209 07/02/22 1644 07/02/22 2125 07/02/22 2340 07/03/22 0116 07/03/22 0505  HGB 12.6  --   --   --   --  12.0  HCT 37.1  --   --   --   --  35.6*  PLT 264  --   --   --   --  239  LABPROT  --   --   --   --   --  13.9  INR  --   --   --   --   --  1.1  HEPARINUNFRC  --   --   --   --  0.50  --   CREATININE 1.13*  --   --   --   --   --   TROPONINIHS 41* 131* 189* 193*  --   --      Estimated Creatinine Clearance: 43.2 mL/min (A) (by C-G formula based on SCr of 1.13 mg/dL (H)).   Medical History: Past Medical History:  Diagnosis Date   Asthma    Coronary artery disease    Depression    Diabetes mellitus without complication (HCC)    GERD (gastroesophageal reflux disease)    History of hiatal hernia    Hypertension    Pain    BACK   Stroke [facility])    x3   Thyroid cyst     Assessment: 61 y.o. female with h/o CAD and prior MI (2019) presenting with chest pain and elevated troponin that is trending up; found to have ACS. Pharmacy has been consulted for IV heparin dosing.   Hgb and platelets WNL. No overt bleeding noted. No prior to  admission anticoagulation noted. Baseline PT/INR, CBC, and aPTT ordered.  7/23 0116 HL 0.50 Therapeutic x1 7/23 0918 HL 0.40 Therapeutic x2; 650 un/hr  Goal of Therapy:  Heparin level 0.3-0.7 units/ml Monitor platelets by anticoagulation protocol: Yes  Plan:  7/23 0918 HL 0.40. Consecutively therapeutic, will switch to daily monitoring of HL.  - Heparin started 7/22 1745>> Continue heparin infusion at 650 units/hr Check confirmatory heparin level in AM Monitor CBC, daily heparin level  Continue to monitor for signs/symptoms of bleeding   Lorna Dibble, PharmD, [facility] Clinical Pharmacist 07/03/2022 10:26 AM

## 2022-07-03 NOTE — Progress Notes (Signed)
[place] for heparin Indication: chest pain/ACS  Allergies  Allergen Reactions   Lisinopril Swelling   Tramadol Other (See Comments) and Nausea Only    sleepiness Other reaction(s): Dizziness, Other, Other (See Comments) somnolence sleepiness Other reaction(s): Other (See Comments), Other (See Comments) Would not wake up after taking this medication somnolence sleepiness sleepiness    Amoxicillin Rash    Don't remember  Don't remember  Don't remember    Clavulanic Acid Rash    Patient Measurements: Height: '5\' 3"'$  (160 cm) Weight: 56.7 kg (125 lb) IBW/kg (Calculated) : 52.4 Heparin Dosing Weight: 56.7 kg   Vital Signs: Temp: 97.7 F (36.5 C) (07/23 0119) Temp Source: Axillary (07/23 0119) BP: 122/69 (07/22 2341) Pulse Rate: 51 (07/22 2341)  Labs: Recent Labs    07/02/22 1209 07/02/22 1644 07/02/22 2125 07/02/22 2340 07/03/22 0116  HGB 12.6  --   --   --   --   HCT 37.1  --   --   --   --   PLT 264  --   --   --   --   HEPARINUNFRC  --   --   --   --  0.50  CREATININE 1.13*  --   --   --   --   TROPONINIHS 41* 131* 189* 193*  --      Estimated Creatinine Clearance: 43.2 mL/min (A) (by C-G formula based on SCr of 1.13 mg/dL (H)).   Medical History: Past Medical History:  Diagnosis Date   Asthma    Coronary artery disease    Depression    Diabetes mellitus without complication (HCC)    GERD (gastroesophageal reflux disease)    History of hiatal hernia    Hypertension    Pain    BACK   Stroke [facility])    x3   Thyroid cyst     Assessment: 61 y.o. female with h/o CAD and prior MI (2019) presenting with chest pain and elevated troponin that is trending up; found to have ACS. Pharmacy has been consulted for IV heparin dosing.   Hgb and platelets WNL. No overt bleeding noted. No prior to admission anticoagulation noted. Baseline PT/INR, CBC, and aPTT ordered.  7/23 0116 HL 0.50 therapeutic  Goal of Therapy:   Heparin level 0.3-0.7 units/ml Monitor platelets by anticoagulation protocol: Yes  Plan:  7/23 0116 HL 0.50 therapeutic continue heparin infusion at 650 units/hr Check confirmatory heparin level in 6 hours  Monitor CBC, daily heparin level  Continue to monitor for signs/symptoms of bleeding   Chinita Greenland PharmD Clinical Pharmacist 07/03/2022

## 2022-07-03 NOTE — Assessment & Plan Note (Addendum)
Unclear if still active issue.  A1c 3 years ago at 6.8.  Not on any home medications. A1C in process, follow outpatient

## 2022-07-03 NOTE — Consult Note (Signed)
Kayla Malone is a 61 y.o. female  [id number]  Primary Cardiologist: [name], MD Reason for Consultation: chest pain  HPI: Kayla Malone is a 61 y.o. female presented to the ER for evaluation of chest pain and tightness that started this morning.  Patient unfortunately found her sister dead yesterday morning and has felt very anxious and started having chest pain immediately after finding her.  Patient does have a history of CAD.  No nausea or vomiting.  Denies any fevers or chills no cough or congestion.   He has had some intermittent clamminess and diaphoresis.     Review of Systems: reports chest tightness in the center and left chest, radiating to her abdomen   Past Medical History:  Diagnosis Date   Asthma    Coronary artery disease    Depression    Diabetes mellitus without complication (HCC)    GERD (gastroesophageal reflux disease)    History of hiatal hernia    Hypertension    Pain    BACK   Stroke ([place])    x3   Thyroid cyst     (Not in a hospital admission)     atorvastatin  80 mg Oral Daily   clopidogrel  75 mg Oral Daily   losartan  100 mg Oral Daily   pantoprazole  40 mg Oral Daily   potassium chloride  40 mEq Oral Once    Infusions:  heparin 650 Units/hr (07/02/22 1813)    Allergies  Allergen Reactions   Lisinopril Swelling   Tramadol Other (See Comments) and Nausea Only    sleepiness Other reaction(s): Dizziness, Other, Other (See Comments) somnolence sleepiness Other reaction(s): Other (See Comments), Other (See Comments) Would not wake up after taking this medication somnolence sleepiness sleepiness    Amoxicillin Rash    Don't remember  Don't remember  Don't remember    Clavulanic Acid Rash    Social History   Socioeconomic History   Marital status: Married    Spouse name: Not on file   Number of children: Not on file   Years of education: Not on file   Highest education level: Not on file  Occupational  History   Not on file  Tobacco Use   Smoking status: Former    Types: Cigarettes    Quit date: 09/13/1998    Years since quitting: 23.8   Smokeless tobacco: Never  Vaping Use   Vaping Use: Never used  Substance and Sexual Activity   Alcohol use: No   Drug use: No   Sexual activity: Yes    Birth control/protection: Post-menopausal  Other Topics Concern   Not on file  Social History Narrative   Not on file   Social Determinants of Health   Financial Resource Strain: Not on file  Food Insecurity: Not on file  Transportation Needs: Not on file  Physical Activity: Not on file  Stress: Not on file  Social Connections: Not on file  Intimate Partner Violence: Not on file    Family History  Problem Relation Age of Onset   Heart failure Mother    Pulmonary embolism Mother    Cirrhosis Father    Coronary artery disease Father    Diabetes type II Father    Hypertension Father    Kidney disease Father    Coronary artery disease Sister    Stroke Sister    COPD Brother    Coronary artery disease Brother    Breast cancer Neg Hx  PHYSICAL EXAM: Vitals:   07/03/22 1015 07/03/22 1030  BP: 131/83 123/74  Pulse: (!) 51 (!) 50  Resp:    Temp:    SpO2: 95% 99%     Intake/Output Summary (Last 24 hours) at 07/03/2022 1131 Last data filed at 07/02/2022 2228 Gross per 24 hour  Intake 200 ml  Output --  Net 200 ml    General:  Well appearing. No respiratory difficulty HEENT: normal Neck: supple. no JVD. Carotids 2+ bilat; no bruits. No lymphadenopathy or thryomegaly appreciated. Cor: PMI nondisplaced. Regular rate & rhythm. No rubs, gallops or murmurs. Lungs: clear Abdomen: soft, nontender, nondistended. No hepatosplenomegaly. No bruits or masses. Good bowel sounds. Extremities: no cyanosis, clubbing, rash, edema Neuro: alert & oriented x 3, cranial nerves grossly intact. moves all 4 extremities w/o difficulty. Affect pleasant.  ECG: NSR, HR 62 bpm, non-specific ST  changes  Results for orders placed or performed during the hospital encounter of 07/02/22 (from the past 24 hour(s))  Troponin I (High Sensitivity)     Status: Abnormal   Collection Time: 07/02/22 12:09 PM  Result Value Ref Range   Troponin I (High Sensitivity) 41 (H) <18 ng/L  CBC with Differential     Status: None   Collection Time: 07/02/22 12:09 PM  Result Value Ref Range   WBC 4.8 4.0 - 10.5 K/uL   RBC 4.33 3.87 - 5.11 MIL/uL   Hemoglobin 12.6 12.0 - 15.0 g/dL   HCT 37.1 36.0 - 46.0 %   MCV 85.7 80.0 - 100.0 fL   MCH 29.1 26.0 - 34.0 pg   MCHC 34.0 30.0 - 36.0 g/dL   RDW 12.5 11.5 - 15.5 %   Platelets 264 150 - 400 K/uL   nRBC 0.0 0.0 - 0.2 %   Neutrophils Relative % 47 %   Neutro Abs 2.2 1.7 - 7.7 K/uL   Lymphocytes Relative 44 %   Lymphs Abs 2.1 0.7 - 4.0 K/uL   Monocytes Relative 8 %   Monocytes Absolute 0.4 0.1 - 1.0 K/uL   Eosinophils Relative 0 %   Eosinophils Absolute 0.0 0.0 - 0.5 K/uL   Basophils Relative 1 %   Basophils Absolute 0.0 0.0 - 0.1 K/uL   Immature Granulocytes 0 %   Abs Immature Granulocytes 0.00 0.00 - 0.07 K/uL  Basic metabolic panel     Status: Abnormal   Collection Time: 07/02/22 12:09 PM  Result Value Ref Range   Sodium 138 135 - 145 mmol/L   Potassium 2.5 (LL) 3.5 - 5.1 mmol/L   Chloride 101 98 - 111 mmol/L   CO2 26 22 - 32 mmol/L   Glucose, Bld 130 (H) 70 - 99 mg/dL   BUN 10 8 - 23 mg/dL   Creatinine, Ser 1.13 (H) 0.44 - 1.00 mg/dL   Calcium 9.1 8.9 - 10.3 mg/dL   GFR, Estimated 55 (L) >60 mL/min   Anion gap 11 5 - 15  Magnesium     Status: Abnormal   Collection Time: 07/02/22 12:09 PM  Result Value Ref Range   Magnesium 1.5 (L) 1.7 - 2.4 mg/dL  Troponin I (High Sensitivity)     Status: Abnormal   Collection Time: 07/02/22  4:44 PM  Result Value Ref Range   Troponin I (High Sensitivity) 131 (HH) <18 ng/L  Troponin I (High Sensitivity)     Status: Abnormal   Collection Time: 07/02/22  9:25 PM  Result Value Ref Range   Troponin I  (High Sensitivity) 189 (HH) <18  ng/L  HIV Antibody (routine testing w rflx)     Status: None   Collection Time: 07/02/22  9:25 PM  Result Value Ref Range   HIV Screen 4th Generation wRfx Non Reactive Non Reactive  Troponin I (High Sensitivity)     Status: Abnormal   Collection Time: 07/02/22 11:40 PM  Result Value Ref Range   Troponin I (High Sensitivity) 193 (HH) <18 ng/L  Heparin level (unfractionated)     Status: None   Collection Time: 07/03/22  1:16 AM  Result Value Ref Range   Heparin Unfractionated 0.50 0.30 - 0.70 IU/mL  CBC with Differential/Platelet     Status: Abnormal   Collection Time: 07/03/22  5:05 AM  Result Value Ref Range   WBC 4.2 4.0 - 10.5 K/uL   RBC 4.16 3.87 - 5.11 MIL/uL   Hemoglobin 12.0 12.0 - 15.0 g/dL   HCT 35.6 (L) 36.0 - 46.0 %   MCV 85.6 80.0 - 100.0 fL   MCH 28.8 26.0 - 34.0 pg   MCHC 33.7 30.0 - 36.0 g/dL   RDW 12.6 11.5 - 15.5 %   Platelets 239 150 - 400 K/uL   nRBC 0.0 0.0 - 0.2 %   Neutrophils Relative % 37 %   Neutro Abs 1.5 (L) 1.7 - 7.7 K/uL   Lymphocytes Relative 52 %   Lymphs Abs 2.3 0.7 - 4.0 K/uL   Monocytes Relative 9 %   Monocytes Absolute 0.4 0.1 - 1.0 K/uL   Eosinophils Relative 1 %   Eosinophils Absolute 0.0 0.0 - 0.5 K/uL   Basophils Relative 1 %   Basophils Absolute 0.0 0.0 - 0.1 K/uL   Immature Granulocytes 0 %   Abs Immature Granulocytes 0.01 0.00 - 0.07 K/uL  Protime-INR     Status: None   Collection Time: 07/03/22  5:05 AM  Result Value Ref Range   Prothrombin Time 13.9 11.4 - 15.2 seconds   INR 1.1 0.8 - 1.2  Basic metabolic panel     Status: Abnormal   Collection Time: 07/03/22  9:18 AM  Result Value Ref Range   Sodium 141 135 - 145 mmol/L   Potassium 3.2 (L) 3.5 - 5.1 mmol/L   Chloride 107 98 - 111 mmol/L   CO2 25 22 - 32 mmol/L   Glucose, Bld 93 70 - 99 mg/dL   BUN 10 8 - 23 mg/dL   Creatinine, Ser 1.03 (H) 0.44 - 1.00 mg/dL   Calcium 8.8 (L) 8.9 - 10.3 mg/dL   GFR, Estimated >60 >60 mL/min   Anion gap 9  5 - 15  Magnesium     Status: None   Collection Time: 07/03/22  9:18 AM  Result Value Ref Range   Magnesium 2.2 1.7 - 2.4 mg/dL  Phosphorus     Status: None   Collection Time: 07/03/22  9:18 AM  Result Value Ref Range   Phosphorus 3.2 2.5 - 4.6 mg/dL  Heparin level (unfractionated)     Status: None   Collection Time: 07/03/22  9:18 AM  Result Value Ref Range   Heparin Unfractionated 0.40 0.30 - 0.70 IU/mL  Troponin I (High Sensitivity)     Status: Abnormal   Collection Time: 07/03/22  9:18 AM  Result Value Ref Range   Troponin I (High Sensitivity) 152 (HH) <18 ng/L  Brain natriuretic peptide     Status: None   Collection Time: 07/03/22  9:18 AM  Result Value Ref Range   B Natriuretic Peptide 70.8 0.0 -  100.0 pg/mL   DG Chest 2 View  Result Date: 07/02/2022 CLINICAL DATA:  Chest pain beginning this morning with some shortness-of-breath. EXAM: CHEST - 2 VIEW COMPARISON:  10/30/2020 FINDINGS: Lungs are adequately inflated without focal airspace consolidation or effusion. Cardiomediastinal silhouette and remainder of the exam is unchanged. IMPRESSION: No active cardiopulmonary disease. Electronically Signed   By: Marin Olp M.D.   On: 07/02/2022 12:27     ASSESSMENT AND PLAN: Patient presented to ED yesterday after finding sister deceased. Patient known to our office. Stress test 06/2022 revealed EF 73%, diffuse ischemia in all 3 vessels. Schedule for a CCTA in the coming weeks. Echo 06/2022 revealed EF 79.4%, mild MR, trace PR, trace TR, mod. LVH, borderline pulmonary HTN.  Troponin levels peaked at 193, this morning 152. Patient continues to experience chest pain, recent abnormal stress test. Patient agreeable to scheduling heart catheterization. Will scheduled for heart catheterization for tomorrow 07/04/22.     Engineer, drilling FNP-C

## 2022-07-03 NOTE — Assessment & Plan Note (Signed)
>>  ASSESSMENT AND PLAN FOR ENCOUNTER FOR DIABETIC FOOT EXAM (HCC) WRITTEN ON 07/04/2022  4:06 PM BY ALEXANDER, NATALIE, DO  Unclear if still active issue.  A1c 3 years ago at 6.8.  Not on any home medications. A1C in process, follow outpatient

## 2022-07-03 NOTE — Progress Notes (Signed)
[facility] Progress Note  Patient: Kayla Malone    RJJ:[id number]  DOA: 07/02/2022    Date of Service: the patient was seen and examined on 07/03/2022  Brief hospital course: Patient is a 61 year old female with past medical history of hypertension, hyperlipidemia, diastolic heart failure and CAD status post CABG in 2018 and PCI in 2019 who presented to the emergency room on 7/22 with chest tightness that started earlier that morning when she had found out that her sister had just died.  In the emergency room, initial troponin minimally elevated at 41 however subsequent sets continue to trend upward slightly to as high as 193. Seen by cardiology who plans to take for cardiac cath in the morning.  Assessment and Plan: Assessment and Plan: * NSTEMI (non-ST elevated myocardial infarction) ([place]) Persistently elevated troponins in the context of significant stressor event.  Patient had a recent stress test noting diffuse vessel disease.  A cardiac cath had been scheduled as outpatient in the next week or 2.  With the events of above and coming in and noting elevated troponins, cardiology has seen and plans to take patient for cardiac catheterization tomorrow.  Diabetes ([place]) Unclear if still active issue.  A1c 3 years ago at 6.8.  Not on any home medications.  We will recheck.  Hypomagnesemia Replacing as needed  Hypokalemia Replacing as needed  GERD (gastroesophageal reflux disease) Continue PPI  Hyperlipidemia, unspecified Continue statin       Body mass index is 22.14 kg/m.        Consultants: Cardiology  Procedures: Plan cardiac cath 7/24  Antimicrobials: None  Code Status: Full code   Subjective: Patient tired, sad.  Chest pain resolved.  Objective: Noted mild bradycardia Vitals:   07/03/22 1730 07/03/22 1807  BP: (!) 142/77 139/82  Pulse: (!) 50 (!) 51  Resp: 20 16  Temp:  98.7 F (37.1 C)  SpO2: 99% 100%    Intake/Output Summary (Last 24  hours) at 07/03/2022 2025 Last data filed at 07/02/2022 2228 Gross per 24 hour  Intake 150 ml  Output --  Net 150 ml   Filed Weights   07/02/22 1207  Weight: 56.7 kg   Body mass index is 22.14 kg/m.  Exam:  General: Alert and oriented x3, no acute distress HEENT: Normocephalic, atraumatic, mucous membranes are moist Cardiovascular: Regular rate and rhythm, S1-S2 Respiratory: Clear to auscultation bilaterally Abdomen: Soft, nontender, nondistended, positive bowel sounds Musculoskeletal: No clubbing or cyanosis or edema Skin: No skin breaks, tears or lesions Psychiatry: Appropriate, no evidence of psychoses Neurology: No focal deficits  Data Reviewed: CBC from today normal  Disposition:  Status is: Inpatient Remains inpatient appropriate because: Plans for cardiac catheterization 7/24    Anticipated discharge date: 7/25 Family Communication: Close friend at the bedside DVT Prophylaxis:   Heparin infusion    Author: Annita Brod ,MD 07/03/2022 8:25 PM  To reach On-call, see care teams to locate the attending and reach out via [email or web address]. Between 7PM-7AM, please contact night-coverage If you still have difficulty reaching the attending provider, please page the [facility] (Director on Call) for [facility] on amion for assistance.

## 2022-07-03 NOTE — Hospital Course (Addendum)
Patient is a 61 year old female with past medical history of hypertension, hyperlipidemia, diastolic heart failure and CAD status post CABG in 2018 and PCI in 2019 who presented to the emergency room on 7/22 with chest tightness that started earlier that morning when she had found out that her sister had just died.  In the emergency room, initial troponin minimally elevated at 41 however subsequent sets continue to trend upward slightly to as high as 193. Seen by cardiology who plans to take for cardiac cath in the morning.

## 2022-07-03 NOTE — ED Notes (Signed)
Advised nurse that patient has ready bed 

## 2022-07-04 ENCOUNTER — Encounter
Admission: EM | Disposition: A | Discharge status: Home / Self Care | Payer: Self-pay | Source: Home / Self Care | Attending: Internal Medicine

## 2022-07-04 ENCOUNTER — Encounter: Payer: Self-pay | Admitting: Cardiovascular Disease

## 2022-07-04 DIAGNOSIS — I214 Non-ST elevation (NSTEMI) myocardial infarction: Secondary | ICD-10-CM | POA: Diagnosis not present

## 2022-07-04 HISTORY — PX: LEFT HEART CATH AND CORS/GRAFTS ANGIOGRAPHY: CATH118250

## 2022-07-04 LAB — CBC
HCT: 34.8 % — ABNORMAL LOW (ref 36.0–46.0)
HCT: 36 % (ref 36.0–46.0)
Hemoglobin: 11.9 g/dL — ABNORMAL LOW (ref 12.0–15.0)
Hemoglobin: 12.4 g/dL (ref 12.0–15.0)
MCH: 28.5 pg (ref 26.0–34.0)
MCH: 28.9 pg (ref 26.0–34.0)
MCHC: 34.2 g/dL (ref 30.0–36.0)
MCHC: 34.4 g/dL (ref 30.0–36.0)
MCV: 83.3 fL (ref 80.0–100.0)
MCV: 83.9 fL (ref 80.0–100.0)
Platelets: 237 10*3/uL (ref 150–400)
Platelets: 223 10*3/uL (ref 150–400)
RBC: 4.18 MIL/uL (ref 3.87–5.11)
RBC: 4.29 MIL/uL (ref 3.87–5.11)
RDW: 12.5 % (ref 11.5–15.5)
RDW: 12.5 % (ref 11.5–15.5)
WBC: 4 10*3/uL (ref 4.0–10.5)
WBC: 3.4 10*3/uL — ABNORMAL LOW (ref 4.0–10.5)
nRBC: 0 % (ref 0.0–0.2)
nRBC: 0 % (ref 0.0–0.2)

## 2022-07-04 LAB — HEPARIN LEVEL (UNFRACTIONATED): Heparin Unfractionated: 0.29 IU/mL — ABNORMAL LOW (ref 0.30–0.70)

## 2022-07-04 LAB — LIPID PANEL
Cholesterol: 111 mg/dL (ref 0–200)
HDL: 28 mg/dL — ABNORMAL LOW (ref >40)
LDL Cholesterol: 69 mg/dL (ref 0–99)
Total CHOL/HDL Ratio: 4 RATIO
Triglycerides: 72 mg/dL (ref <150)
VLDL: 14 mg/dL (ref 0–40)

## 2022-07-04 LAB — MAGNESIUM: Magnesium: 1.8 mg/dL (ref 1.7–2.4)

## 2022-07-04 LAB — GLUCOSE, CAPILLARY
Glucose-Capillary: 73 mg/dL (ref 70–99)
Glucose-Capillary: 71 mg/dL (ref 70–99)

## 2022-07-04 LAB — APTT: aPTT: 32 seconds (ref 24–36)

## 2022-07-04 LAB — PHOSPHORUS: Phosphorus: 3 mg/dL (ref 2.5–4.6)

## 2022-07-04 LAB — TROPONIN I (HIGH SENSITIVITY)
Troponin I (High Sensitivity): 312 ng/L (ref <18)
Troponin I (High Sensitivity): 309 ng/L (ref <18)

## 2022-07-04 LAB — HEMOGLOBIN A1C
Hgb A1c MFr Bld: 6.4 % — ABNORMAL HIGH (ref 4.8–5.6)
Mean Plasma Glucose: 136.98 mg/dL

## 2022-07-04 SURGERY — LEFT HEART CATH AND CORS/GRAFTS ANGIOGRAPHY
Anesthesia: Moderate Sedation

## 2022-07-04 MED ORDER — SODIUM CHLORIDE 0.9% FLUSH: 3.0000 mL | INTRAVENOUS | Status: DC | PRN

## 2022-07-04 MED ORDER — HEPARIN (PORCINE) IN NACL 1000-0.9 UT/500ML-% IV SOLN
INTRAVENOUS | Status: DC | PRN
  Administered 2022-07-04 (×2): 500 mL

## 2022-07-04 MED ORDER — LORAZEPAM 0.5 MG PO TABS
0.5000 mg | ORAL_TABLET | Freq: Once | ORAL | Status: AC
  Administered 2022-07-04: 0.5 mg via ORAL
  Filled 2022-07-04 (×2): qty 1

## 2022-07-04 MED ORDER — ACETAMINOPHEN 650 MG RE SUPP: 650.0000 mg | Freq: Four times a day (QID) | RECTAL | Status: DC | PRN

## 2022-07-04 MED ORDER — ASPIRIN 81 MG PO CHEW: 81.0000 mg | CHEWABLE_TABLET | ORAL | Status: DC

## 2022-07-04 MED ORDER — SODIUM CHLORIDE 0.9 % WEIGHT BASED INFUSION
1.0000 mL/kg/h | INTRAVENOUS | Status: DC
Start: 2022-07-05 — End: 2022-07-04

## 2022-07-04 MED ORDER — SODIUM CHLORIDE 0.9 % WEIGHT BASED INFUSION
3.0000 mL/kg/h | INTRAVENOUS | Status: DC
Start: 2022-07-05 — End: 2022-07-04

## 2022-07-04 MED ORDER — LABETALOL HCL 5 MG/ML IV SOLN: 10.0000 mg | INTRAVENOUS | Status: AC | PRN

## 2022-07-04 MED ORDER — MIDAZOLAM HCL 2 MG/2ML IJ SOLN
INTRAMUSCULAR | Status: DC | PRN
  Administered 2022-07-04: .5 mg via INTRAVENOUS
  Administered 2022-07-04: 1 mg via INTRAVENOUS

## 2022-07-04 MED ORDER — MIDAZOLAM HCL 2 MG/2ML IJ SOLN
INTRAMUSCULAR | Status: AC
  Filled 2022-07-04: qty 2

## 2022-07-04 MED ORDER — ACETAMINOPHEN 325 MG PO TABS: 650.0000 mg | ORAL_TABLET | Freq: Four times a day (QID) | ORAL | Status: DC | PRN

## 2022-07-04 MED ORDER — SODIUM CHLORIDE 0.9 % IV SOLN: 250.0000 mL | INTRAVENOUS | Status: DC | PRN

## 2022-07-04 MED ORDER — SODIUM CHLORIDE 0.9 % WEIGHT BASED INFUSION: 3.0000 mL/kg/h | INTRAVENOUS | Status: DC

## 2022-07-04 MED ORDER — FENTANYL CITRATE (PF) 100 MCG/2ML IJ SOLN
INTRAMUSCULAR | Status: AC
  Filled 2022-07-04: qty 2

## 2022-07-04 MED ORDER — DICLOFENAC SODIUM 1 % EX GEL
2.0000 g | CUTANEOUS | Status: DC | PRN
Start: 2022-07-04 — End: 2022-07-04

## 2022-07-04 MED ORDER — LIDOCAINE HCL (PF) 1 % IJ SOLN
INTRAMUSCULAR | Status: DC | PRN
  Administered 2022-07-04: 5 mL

## 2022-07-04 MED ORDER — LIDOCAINE HCL 1 % IJ SOLN
INTRAMUSCULAR | Status: AC
  Filled 2022-07-04: qty 20

## 2022-07-04 MED ORDER — HEPARIN (PORCINE) IN NACL 1000-0.9 UT/500ML-% IV SOLN
INTRAVENOUS | Status: AC
  Filled 2022-07-04: qty 1000

## 2022-07-04 MED ORDER — ASPIRIN 81 MG PO CHEW
81.0000 mg | CHEWABLE_TABLET | ORAL | Status: AC
  Administered 2022-07-04: 81 mg via ORAL
  Filled 2022-07-04: qty 1

## 2022-07-04 MED ORDER — SODIUM CHLORIDE 0.9 % WEIGHT BASED INFUSION: 1.0000 mL/kg/h | INTRAVENOUS | Status: DC

## 2022-07-04 MED ORDER — HYDRALAZINE HCL 20 MG/ML IJ SOLN: 10.0000 mg | INTRAMUSCULAR | Status: AC | PRN

## 2022-07-04 MED ORDER — ACETAMINOPHEN 325 MG PO TABS: 650.0000 mg | ORAL_TABLET | ORAL | Status: DC | PRN

## 2022-07-04 MED ORDER — SODIUM CHLORIDE 0.9 % WEIGHT BASED INFUSION
1.0000 mL/kg/h | INTRAVENOUS | Status: DC
  Administered 2022-07-04: 1 mL/kg/h via INTRAVENOUS

## 2022-07-04 MED ORDER — ONDANSETRON HCL 4 MG/2ML IJ SOLN: 4.0000 mg | Freq: Four times a day (QID) | INTRAMUSCULAR | Status: DC | PRN

## 2022-07-04 MED ORDER — IOHEXOL 300 MG/ML  SOLN
INTRAMUSCULAR | Status: DC | PRN
  Administered 2022-07-04: 105 mL

## 2022-07-04 MED ORDER — SODIUM CHLORIDE 0.9% FLUSH
3.0000 mL | Freq: Two times a day (BID) | INTRAVENOUS | Status: DC
Start: 2022-07-04 — End: 2022-07-04

## 2022-07-04 MED ORDER — CLOPIDOGREL BISULFATE 75 MG PO TABS
75.0000 mg | ORAL_TABLET | Freq: Every day | ORAL | Status: DC
  Administered 2022-07-04: 75 mg via ORAL
  Filled 2022-07-04: qty 1

## 2022-07-04 MED ORDER — FENTANYL CITRATE (PF) 100 MCG/2ML IJ SOLN
INTRAMUSCULAR | Status: DC | PRN
  Administered 2022-07-04 (×2): 25 ug via INTRAVENOUS

## 2022-07-04 MED ORDER — SODIUM CHLORIDE 0.9 % WEIGHT BASED INFUSION
3.0000 mL/kg/h | INTRAVENOUS | Status: AC
  Administered 2022-07-04: 3 mL/kg/h via INTRAVENOUS

## 2022-07-04 SURGICAL SUPPLY — 16 items
CATH ANGIO 5F JB2 100CM (CATHETERS) ×1 IMPLANT
CATH INFINITI 5 FR IM (CATHETERS) ×1 IMPLANT
CATH INFINITI 5 FR MPA2 (CATHETERS) ×1 IMPLANT
CATH INFINITI 5FR MULTPACK ANG (CATHETERS) ×1 IMPLANT
DEVICE CLOSURE MYNXGRIP 5F (Vascular Products) ×1 IMPLANT
DRAPE BRACHIAL (DRAPES) ×1 IMPLANT
KIT SYRINGE INJ CVI SPIKEX1 (MISCELLANEOUS) ×1 IMPLANT
NDL PERC 18GX7CM (NEEDLE) IMPLANT
NEEDLE PERC 18GX7CM (NEEDLE) ×2 IMPLANT
PACK CARDIAC CATH (CUSTOM PROCEDURE TRAY) ×2 IMPLANT
PROTECTION STATION PRESSURIZED (MISCELLANEOUS) ×2
SET ATX SIMPLICITY (MISCELLANEOUS) ×1 IMPLANT
SHEATH AVANTI 5FR X 11CM (SHEATH) ×1 IMPLANT
STATION PROTECTION PRESSURIZED (MISCELLANEOUS) IMPLANT
WIRE EMERALD 3MM-J .035X260CM (WIRE) ×1 IMPLANT
WIRE GUIDERIGHT .035X150 (WIRE) ×1 IMPLANT

## 2022-07-04 NOTE — Progress Notes (Signed)
Discussed discharge instruction with patient and husband, including medications and follow up appointments.   Encouraged patient to monitor cath site for bleeding and infections.    Femoral site care information and AVS sent home with patient

## 2022-07-04 NOTE — Discharge Summary (Signed)
Physician Discharge Summary   Patient: Kayla Malone MRN: [id number]  DOB: 17-Dec-1960   Admit:     Date of Admission: 07/02/2022 Admitted from: home   Discharge: Date of discharge: 07/04/22 Disposition: Home Condition at discharge: good  CODE STATUS: FULL   Diet recommendation: Cardiac and Carb modified diet   Discharge Physician: Sunnie Nielsen, DO [facility]     PCP: Margaretann Loveless, MD  Recommendations for Outpatient Follow-up:  Follow up with PCP Margaretann Loveless, MD in 1-2 weeks and check pending A1C which was drawn inpatient  Follow as directed w/ cardiology this Friday  Please follow up on the following pending results: none   Discharge Instructions     AMB Referral to Cardiac Rehabilitation - Phase II   Complete by: As directed    Diagnosis: NSTEMI   After initial evaluation and assessments completed: Virtual Based Care may be provided alone or in conjunction with Phase 2 Cardiac Rehab based on patient barriers.: Yes   Diet - low sodium heart healthy   Complete by: As directed    Increase activity slowly   Complete by: As directed           Brief/Interim Summary: Patient is a 61 year old female with past medical history of hypertension, hyperlipidemia, diastolic heart failure and CAD status post CABG in 2018 and PCI in 2019 who presented to the emergency room on 7/22 with chest tightness that started earlier that morning when she had found out that her sister had just died.  In the emergency room, initial troponin minimally elevated at 41 however subsequent sets continue to trend upward slightly to as high as 193. Seen by cardiology and cardiac cath 07/04/22 w. Dr Welton Flakes. Per Dr Welton Flakes 07/24: "Demand ischemia with mildly elevated troponin.  LIMA to the LAD is patent SVG to PDA is occluded but right coronary stent from mid to distal RCA is without any significant disease.  Left circumflex is normal.  Ejection fraction is normal.  Advise medical  therapy.  With high-dose statin aspirin and Plavix.  Patient can be discharged today with follow-up Friday at 9:00 in my office."  Consultants:  Cardiology  Procedures:  Cardiac cath 07/04/22       Discharge Diagnoses: Principal Problem:   NSTEMI (non-ST elevated myocardial infarction) (HCC) Active Problems:   Diabetes (HCC)   Hypokalemia   Hypomagnesemia   GERD (gastroesophageal reflux disease)   Hyperlipidemia, unspecified    Assessment & Plan:  NSTEMI (non-ST elevated myocardial infarction) (HCC) Persistently elevated troponins in the context of significant stressor event.  Patient had a recent stress test noting diffuse vessel disease. Cardiac cath 07/04/22 "Demand ischemia with mildly elevated troponin.  LIMA to the LAD is patent SVG to PDA is occluded but right coronary stent from mid to distal RCA is without any significant disease.  Left circumflex is normal.  Ejection fraction is normal.  Advise medical therapy.  With high-dose statin aspirin and Plavix."  Hypokalemia Replacing as needed  Hypomagnesemia Replacing as needed  GERD (gastroesophageal reflux disease) Continue PPI  Hyperlipidemia, unspecified Continue statin  Diabetes (HCC) Unclear if still active issue.  A1c 3 years ago at 6.8.  Not on any home medications. A1C in process, follow outpatient      Discharge Instructions  Allergies as of 07/04/2022       Reactions   Lisinopril Swelling   Tramadol Other (See Comments), Nausea Only   sleepiness Other reaction(s): Dizziness, Other, Other (See Comments) somnolence  sleepiness Other reaction(s): Other (See Comments), Other (See Comments) Would not wake up after taking this medication somnolence sleepiness sleepiness   Amoxicillin Rash   Don't remember  Don't remember  Don't remember    Clavulanic Acid Rash        Medication List     STOP taking these medications    amLODipine 5 MG tablet Commonly known as: NORVASC   Citracal  Plus Tabs   Fluticasone-Salmeterol 500-50 MCG/DOSE Aepb Commonly known as: ADVAIR   Nexlizet 180-10 MG Tabs Generic drug: Bempedoic Acid-Ezetimibe   prochlorperazine 25 MG suppository Commonly known as: COMPAZINE   promethazine 12.5 MG tablet Commonly known as: PHENERGAN   tetracycline 500 MG capsule Commonly known as: SUMYCIN   traZODone 50 MG tablet Commonly known as: DESYREL   Voltaren 1 % Gel Generic drug: diclofenac Sodium       TAKE these medications    Accu-Chek Guide Me w/Device Kit   Accu-Chek Guide test strip Generic drug: glucose blood Blood Glucose Test In Vitro Strip QTY: 100 strip Days: 90 Refills: 3  Written: 06/07/19 Patient Instructions: E11.9 - to check blodd sugars once daily   Accu-Chek Softclix Lancets lancets   acetaminophen 500 MG tablet Commonly known as: TYLENOL Take 1,000 mg by mouth every 6 (six) hours as needed for mild pain or headache.   aspirin EC 81 MG tablet Take 81 mg by mouth daily.   atorvastatin 80 MG tablet Commonly known as: LIPITOR Take 80 mg by mouth daily.   chlorthalidone 25 MG tablet Commonly known as: HYGROTON Chlorthalidone 25 MG Oral Tablet QTY: 90 tablet Days: 90 Refills: 2  Written: 04/09/20 Patient Instructions: once a day   clopidogrel 75 MG tablet Commonly known as: PLAVIX Take by mouth.   fluticasone 50 MCG/ACT nasal spray Commonly known as: FLONASE Place 2 sprays into both nostrils daily.   isosorbide mononitrate 30 MG 24 hr tablet Commonly known as: IMDUR Take 30 mg by mouth daily.   losartan 100 MG tablet Commonly known as: COZAAR Take 100 mg by mouth daily.   metoprolol succinate 50 MG 24 hr tablet Commonly known as: TOPROL-XL Take 50 mg by mouth daily. Take with or immediately following a meal.   nitroGLYCERIN 0.4 MG SL tablet Commonly known as: NITROSTAT Place 1 tablet (0.4 mg total) under the tongue every 5 (five) minutes x 3 doses as needed for chest pain.   pantoprazole 40 MG  tablet Commonly known as: PROTONIX Take 40 mg by mouth.          Allergies  Allergen Reactions   Lisinopril Swelling   Tramadol Other (See Comments) and Nausea Only    sleepiness Other reaction(s): Dizziness, Other, Other (See Comments) somnolence sleepiness Other reaction(s): Other (See Comments), Other (See Comments) Would not wake up after taking this medication somnolence sleepiness sleepiness    Amoxicillin Rash    Don't remember  Don't remember  Don't remember    Clavulanic Acid Rash     Subjective: pt denies CP/SOB, reports feeling tired but no lightheadedness.dizziness,    Discharge Exam: Vitals:   07/04/22 1201 07/04/22 1540  BP: 130/63 124/76  Pulse: (!) 56 (!) 54  Resp: 17 18  Temp: 98.3 F (36.8 C) 98.7 F (37.1 C)  SpO2: 98%   General: Pt is alert, awake, not in acute distress Cardiovascular: RRR, S1/S2 +, no rubs, no gallops Respiratory: CTA bilaterally, no wheezing, no rhonchi Abdominal: Soft, NT, ND, bowel sounds + Extremities: no edema, no cyanosis  The results of significant diagnostics from this hospitalization (including imaging, microbiology, ancillary and laboratory) are listed below for reference.     Microbiology: No results found for this or any previous visit (from the past 240 hour(s)).   Labs: BNP (last 3 results) Recent Labs    07/03/22 0918  BNP 32.2   Basic Metabolic Panel: Recent Labs  Lab 07/02/22 1209 07/03/22 0918 07/04/22 1011  NA 138 141  --   K 2.5* 3.2*  --   CL 101 107  --   CO2 26 25  --   GLUCOSE 130* 93  --   BUN 10 10  --   CREATININE 1.13* 1.03*  --   CALCIUM 9.1 8.8*  --   MG 1.5* 2.2 1.8  PHOS  --  3.2 3.0   Liver Function Tests: No results for input(s): "AST", "ALT", "ALKPHOS", "BILITOT", "PROT", "ALBUMIN" in the last 168 hours. No results for input(s): "LIPASE", "AMYLASE" in the last 168 hours. No results for input(s): "AMMONIA" in the last 168 hours. CBC: Recent Labs  Lab  07/02/22 1209 07/03/22 0505 07/04/22 0623 07/04/22 1011  WBC 4.8 4.2 4.0 3.4*  NEUTROABS 2.2 1.5*  --   --   HGB 12.6 12.0 11.9* 12.4  HCT 37.1 35.6* 34.8* 36.0  MCV 85.7 85.6 83.3 83.9  PLT 264 239 237 223   Cardiac Enzymes: No results for input(s): "CKTOTAL", "CKMB", "CKMBINDEX", "TROPONINI" in the last 168 hours. BNP: Invalid input(s): "POCBNP" CBG: Recent Labs  Lab 07/04/22 0733 07/04/22 0857  GLUCAP 73 71   D-Dimer No results for input(s): "DDIMER" in the last 72 hours. Hgb A1c No results for input(s): "HGBA1C" in the last 72 hours. Lipid Profile Recent Labs    07/04/22 0623  CHOL 111  HDL 28*  LDLCALC 69  TRIG 72  CHOLHDL 4.0   Thyroid function studies No results for input(s): "TSH", "T4TOTAL", "T3FREE", "THYROIDAB" in the last 72 hours.  Invalid input(s): "FREET3" Anemia work up No results for input(s): "VITAMINB12", "FOLATE", "FERRITIN", "TIBC", "IRON", "RETICCTPCT" in the last 72 hours. Urinalysis    Component Value Date/Time   COLORURINE YELLOW (A) 10/30/2020 1005   APPEARANCEUR CLEAR (A) 10/30/2020 1005   APPEARANCEUR Cloudy 06/10/2012 0226   LABSPEC 1.015 10/30/2020 1005   LABSPEC 1.004 06/10/2012 0226   PHURINE 6.0 10/30/2020 1005   GLUCOSEU NEGATIVE 10/30/2020 1005   GLUCOSEU Negative 06/10/2012 0226   HGBUR NEGATIVE 10/30/2020 1005   BILIRUBINUR NEGATIVE 10/30/2020 1005   BILIRUBINUR Negative 06/10/2012 0226   KETONESUR NEGATIVE 10/30/2020 1005   PROTEINUR NEGATIVE 10/30/2020 1005   NITRITE NEGATIVE 10/30/2020 1005   LEUKOCYTESUR SMALL (A) 10/30/2020 1005   LEUKOCYTESUR 3+ 06/10/2012 0226   Sepsis Labs Recent Labs  Lab 07/02/22 1209 07/03/22 0505 07/04/22 0623 07/04/22 1011  WBC 4.8 4.2 4.0 3.4*   Microbiology No results found for this or any previous visit (from the past 240 hour(s)). Imaging DG Chest 2 View  Result Date: 07/02/2022 CLINICAL DATA:  Chest pain beginning this morning with some shortness-of-breath. EXAM: CHEST  - 2 VIEW COMPARISON:  10/30/2020 FINDINGS: Lungs are adequately inflated without focal airspace consolidation or effusion. Cardiomediastinal silhouette and remainder of the exam is unchanged. IMPRESSION: No active cardiopulmonary disease. Electronically Signed   By: Marin Olp M.D.   On: 07/02/2022 12:27      Time coordinating discharge: Over 30 minutes  SIGNED:  Emeterio Reeve DO [facility]

## 2022-07-04 NOTE — Progress Notes (Signed)
SUBJECTIVE: No further chest pain   Vitals:   07/04/22 0556 07/04/22 0700 07/04/22 0731 07/04/22 0802  BP:   (!) 155/81   Pulse:   (!) 53   Resp:   17   Temp:   98.3 F (36.8 C)   TempSrc:   Oral   SpO2:   98% 98%  Weight: 58 kg 55.9 kg 55.3 kg   Height:   '5\' 3"'$  (1.6 m)     Intake/Output Summary (Last 24 hours) at 07/04/2022 0848 Last data filed at 07/04/2022 0500 Gross per 24 hour  Intake 218.86 ml  Output --  Net 218.86 ml    LABS: Basic Metabolic Panel: Recent Labs    07/02/22 1209 07/03/22 0918  NA 138 141  K 2.5* 3.2*  CL 101 107  CO2 26 25  GLUCOSE 130* 93  BUN 10 10  CREATININE 1.13* 1.03*  CALCIUM 9.1 8.8*  MG 1.5* 2.2  PHOS  --  3.2   Liver Function Tests: No results for input(s): "AST", "ALT", "ALKPHOS", "BILITOT", "PROT", "ALBUMIN" in the last 72 hours. No results for input(s): "LIPASE", "AMYLASE" in the last 72 hours. CBC: Recent Labs    07/02/22 1209 07/03/22 0505 07/04/22 0623  WBC 4.8 4.2 4.0  NEUTROABS 2.2 1.5*  --   HGB 12.6 12.0 11.9*  HCT 37.1 35.6* 34.8*  MCV 85.7 85.6 83.3  PLT 264 239 237   Cardiac Enzymes: No results for input(s): "CKTOTAL", "CKMB", "CKMBINDEX", "TROPONINI" in the last 72 hours. BNP: Invalid input(s): "POCBNP" D-Dimer: No results for input(s): "DDIMER" in the last 72 hours. Hemoglobin A1C: No results for input(s): "HGBA1C" in the last 72 hours. Fasting Lipid Panel: Recent Labs    07/04/22 0623  CHOL 111  HDL 28*  LDLCALC 69  TRIG 72  CHOLHDL 4.0   Thyroid Function Tests: No results for input(s): "TSH", "T4TOTAL", "T3FREE", "THYROIDAB" in the last 72 hours.  Invalid input(s): "FREET3" Anemia Panel: No results for input(s): "VITAMINB12", "FOLATE", "FERRITIN", "TIBC", "IRON", "RETICCTPCT" in the last 72 hours.   PHYSICAL EXAM General: Well developed, well nourished, in no acute distress HEENT:  Normocephalic and atramatic Neck:  No JVD.  Lungs: Clear bilaterally to auscultation and  percussion. Heart: HRRR . Normal S1 and S2 without gallops or murmurs.  Abdomen: Bowel sounds are positive, abdomen soft and non-tender  Msk:  Back normal, normal gait. Normal strength and tone for age. Extremities: No clubbing, cyanosis or edema.   Neuro: Alert and oriented X 3. Psych:  Good affect, responds appropriately  TELEMETRY: Sinus rhythm  ASSESSMENT AND PLAN: Demand ischemia with mildly elevated troponin.  LIMA to the LAD is patent SVG to PDA is occluded but right coronary stent from mid to distal RCA is without any significant disease.  Left circumflex is normal.  Ejection fraction is normal.  Advise medical therapy.  With high-dose statin aspirin and Plavix.  Patient can be discharged today with follow-up Friday at 9:00 in my office.  Principal Problem:   NSTEMI (non-ST elevated myocardial infarction) [facility]) Active Problems:   Diabetes ([place])   Hyperlipidemia, unspecified   GERD (gastroesophageal reflux disease)   Hypokalemia   Hypomagnesemia    Kayla Schnoebelen A, MD, [facility] 07/04/2022 8:48 AM

## 2022-07-12 DIAGNOSIS — I340 Nonrheumatic mitral (valve) insufficiency: Secondary | ICD-10-CM | POA: Diagnosis not present

## 2022-07-12 DIAGNOSIS — I2510 Atherosclerotic heart disease of native coronary artery without angina pectoris: Secondary | ICD-10-CM | POA: Diagnosis not present

## 2022-07-12 DIAGNOSIS — I6389 Other cerebral infarction: Secondary | ICD-10-CM | POA: Diagnosis not present

## 2022-07-12 DIAGNOSIS — E119 Type 2 diabetes mellitus without complications: Secondary | ICD-10-CM | POA: Diagnosis not present

## 2022-07-12 DIAGNOSIS — I10 Essential (primary) hypertension: Secondary | ICD-10-CM | POA: Diagnosis not present

## 2022-07-12 DIAGNOSIS — I739 Peripheral vascular disease, unspecified: Secondary | ICD-10-CM | POA: Diagnosis not present

## 2022-07-12 DIAGNOSIS — I25810 Atherosclerosis of coronary artery bypass graft(s) without angina pectoris: Secondary | ICD-10-CM | POA: Diagnosis not present

## 2022-07-12 DIAGNOSIS — E782 Mixed hyperlipidemia: Secondary | ICD-10-CM | POA: Diagnosis not present

## 2022-08-29 DIAGNOSIS — M5416 Radiculopathy, lumbar region: Secondary | ICD-10-CM | POA: Diagnosis not present

## 2022-08-29 DIAGNOSIS — M25551 Pain in right hip: Secondary | ICD-10-CM | POA: Diagnosis not present

## 2022-08-29 DIAGNOSIS — M19011 Primary osteoarthritis, right shoulder: Secondary | ICD-10-CM | POA: Diagnosis not present

## 2022-08-31 DIAGNOSIS — M7061 Trochanteric bursitis, right hip: Secondary | ICD-10-CM | POA: Diagnosis not present

## 2022-08-31 DIAGNOSIS — E119 Type 2 diabetes mellitus without complications: Secondary | ICD-10-CM | POA: Diagnosis not present

## 2022-09-10 DIAGNOSIS — I10 Essential (primary) hypertension: Secondary | ICD-10-CM | POA: Diagnosis not present

## 2022-09-10 DIAGNOSIS — E782 Mixed hyperlipidemia: Secondary | ICD-10-CM | POA: Diagnosis not present

## 2022-09-13 DIAGNOSIS — I2510 Atherosclerotic heart disease of native coronary artery without angina pectoris: Secondary | ICD-10-CM | POA: Diagnosis not present

## 2022-09-13 DIAGNOSIS — R55 Syncope and collapse: Secondary | ICD-10-CM | POA: Diagnosis not present

## 2022-09-13 DIAGNOSIS — I10 Essential (primary) hypertension: Secondary | ICD-10-CM | POA: Diagnosis not present

## 2022-09-13 DIAGNOSIS — E782 Mixed hyperlipidemia: Secondary | ICD-10-CM | POA: Diagnosis not present

## 2022-09-13 DIAGNOSIS — I739 Peripheral vascular disease, unspecified: Secondary | ICD-10-CM | POA: Diagnosis not present

## 2022-09-13 DIAGNOSIS — I25810 Atherosclerosis of coronary artery bypass graft(s) without angina pectoris: Secondary | ICD-10-CM | POA: Diagnosis not present

## 2022-09-13 DIAGNOSIS — I340 Nonrheumatic mitral (valve) insufficiency: Secondary | ICD-10-CM | POA: Diagnosis not present

## 2022-09-13 DIAGNOSIS — I6389 Other cerebral infarction: Secondary | ICD-10-CM | POA: Diagnosis not present

## 2022-10-07 ENCOUNTER — Other Ambulatory Visit: Payer: Self-pay | Admitting: Internal Medicine

## 2022-10-07 DIAGNOSIS — Z1231 Encounter for screening mammogram for malignant neoplasm of breast: Secondary | ICD-10-CM

## 2022-10-10 ENCOUNTER — Encounter (INDEPENDENT_AMBULATORY_CARE_PROVIDER_SITE_OTHER): Payer: Self-pay

## 2022-10-14 DIAGNOSIS — I739 Peripheral vascular disease, unspecified: Secondary | ICD-10-CM | POA: Diagnosis not present

## 2022-10-14 DIAGNOSIS — I10 Essential (primary) hypertension: Secondary | ICD-10-CM | POA: Diagnosis not present

## 2022-10-14 DIAGNOSIS — E782 Mixed hyperlipidemia: Secondary | ICD-10-CM | POA: Diagnosis not present

## 2022-10-14 DIAGNOSIS — I25810 Atherosclerosis of coronary artery bypass graft(s) without angina pectoris: Secondary | ICD-10-CM | POA: Diagnosis not present

## 2022-10-14 DIAGNOSIS — I2510 Atherosclerotic heart disease of native coronary artery without angina pectoris: Secondary | ICD-10-CM | POA: Diagnosis not present

## 2022-10-14 DIAGNOSIS — E119 Type 2 diabetes mellitus without complications: Secondary | ICD-10-CM | POA: Diagnosis not present

## 2022-10-14 DIAGNOSIS — I340 Nonrheumatic mitral (valve) insufficiency: Secondary | ICD-10-CM | POA: Diagnosis not present

## 2022-10-17 DIAGNOSIS — M5416 Radiculopathy, lumbar region: Secondary | ICD-10-CM | POA: Diagnosis not present

## 2022-10-17 DIAGNOSIS — M25551 Pain in right hip: Secondary | ICD-10-CM | POA: Diagnosis not present

## 2022-10-24 ENCOUNTER — Other Ambulatory Visit: Payer: Self-pay | Admitting: Family

## 2022-10-24 DIAGNOSIS — I10 Essential (primary) hypertension: Secondary | ICD-10-CM | POA: Diagnosis not present

## 2022-10-24 DIAGNOSIS — I2510 Atherosclerotic heart disease of native coronary artery without angina pectoris: Secondary | ICD-10-CM | POA: Diagnosis not present

## 2022-10-24 DIAGNOSIS — E559 Vitamin D deficiency, unspecified: Secondary | ICD-10-CM | POA: Diagnosis not present

## 2022-10-24 DIAGNOSIS — N390 Urinary tract infection, site not specified: Secondary | ICD-10-CM | POA: Diagnosis not present

## 2022-10-24 DIAGNOSIS — N951 Menopausal and female climacteric states: Secondary | ICD-10-CM

## 2022-10-24 DIAGNOSIS — E119 Type 2 diabetes mellitus without complications: Secondary | ICD-10-CM | POA: Diagnosis not present

## 2022-10-24 DIAGNOSIS — E782 Mixed hyperlipidemia: Secondary | ICD-10-CM | POA: Diagnosis not present

## 2022-11-02 DIAGNOSIS — E119 Type 2 diabetes mellitus without complications: Secondary | ICD-10-CM | POA: Diagnosis not present

## 2022-11-02 DIAGNOSIS — H524 Presbyopia: Secondary | ICD-10-CM | POA: Diagnosis not present

## 2022-11-10 DIAGNOSIS — Z0000 Encounter for general adult medical examination without abnormal findings: Secondary | ICD-10-CM | POA: Diagnosis not present

## 2022-11-10 DIAGNOSIS — E041 Nontoxic single thyroid nodule: Secondary | ICD-10-CM | POA: Diagnosis not present

## 2022-11-10 DIAGNOSIS — E1165 Type 2 diabetes mellitus with hyperglycemia: Secondary | ICD-10-CM | POA: Diagnosis not present

## 2022-11-10 DIAGNOSIS — R112 Nausea with vomiting, unspecified: Secondary | ICD-10-CM | POA: Diagnosis not present

## 2022-11-10 DIAGNOSIS — E559 Vitamin D deficiency, unspecified: Secondary | ICD-10-CM | POA: Diagnosis not present

## 2022-11-10 DIAGNOSIS — Z1231 Encounter for screening mammogram for malignant neoplasm of breast: Secondary | ICD-10-CM | POA: Diagnosis not present

## 2022-11-10 DIAGNOSIS — Z6821 Body mass index (BMI) 21.0-21.9, adult: Secondary | ICD-10-CM | POA: Diagnosis not present

## 2022-11-10 DIAGNOSIS — E782 Mixed hyperlipidemia: Secondary | ICD-10-CM | POA: Diagnosis not present

## 2022-11-10 DIAGNOSIS — I10 Essential (primary) hypertension: Secondary | ICD-10-CM | POA: Diagnosis not present

## 2022-11-15 ENCOUNTER — Ambulatory Visit
Admission: RE | Admit: 2022-11-15 | Discharge: 2022-11-15 | Disposition: A | Discharge status: Home / Self Care | Payer: Medicare HMO | Source: Ambulatory Visit | Attending: Internal Medicine | Admitting: Internal Medicine

## 2022-11-15 DIAGNOSIS — Z1231 Encounter for screening mammogram for malignant neoplasm of breast: Secondary | ICD-10-CM | POA: Diagnosis not present

## 2022-12-19 DIAGNOSIS — J029 Acute pharyngitis, unspecified: Secondary | ICD-10-CM | POA: Diagnosis not present

## 2022-12-27 DIAGNOSIS — I739 Peripheral vascular disease, unspecified: Secondary | ICD-10-CM | POA: Diagnosis not present

## 2022-12-27 DIAGNOSIS — I340 Nonrheumatic mitral (valve) insufficiency: Secondary | ICD-10-CM | POA: Diagnosis not present

## 2022-12-27 DIAGNOSIS — G5603 Carpal tunnel syndrome, bilateral upper limbs: Secondary | ICD-10-CM | POA: Diagnosis not present

## 2022-12-27 DIAGNOSIS — M5416 Radiculopathy, lumbar region: Secondary | ICD-10-CM | POA: Diagnosis not present

## 2022-12-27 DIAGNOSIS — E119 Type 2 diabetes mellitus without complications: Secondary | ICD-10-CM | POA: Diagnosis not present

## 2022-12-27 DIAGNOSIS — I2510 Atherosclerotic heart disease of native coronary artery without angina pectoris: Secondary | ICD-10-CM | POA: Diagnosis not present

## 2022-12-27 DIAGNOSIS — I25810 Atherosclerosis of coronary artery bypass graft(s) without angina pectoris: Secondary | ICD-10-CM | POA: Diagnosis not present

## 2022-12-27 DIAGNOSIS — I10 Essential (primary) hypertension: Secondary | ICD-10-CM | POA: Diagnosis not present

## 2022-12-27 DIAGNOSIS — R079 Chest pain, unspecified: Secondary | ICD-10-CM | POA: Diagnosis not present

## 2022-12-27 DIAGNOSIS — E782 Mixed hyperlipidemia: Secondary | ICD-10-CM | POA: Diagnosis not present

## 2023-01-03 DIAGNOSIS — E782 Mixed hyperlipidemia: Secondary | ICD-10-CM | POA: Diagnosis not present

## 2023-01-03 DIAGNOSIS — I10 Essential (primary) hypertension: Secondary | ICD-10-CM | POA: Diagnosis not present

## 2023-01-03 DIAGNOSIS — R079 Chest pain, unspecified: Secondary | ICD-10-CM | POA: Diagnosis not present

## 2023-01-03 DIAGNOSIS — I25810 Atherosclerosis of coronary artery bypass graft(s) without angina pectoris: Secondary | ICD-10-CM | POA: Diagnosis not present

## 2023-01-03 DIAGNOSIS — E119 Type 2 diabetes mellitus without complications: Secondary | ICD-10-CM | POA: Diagnosis not present

## 2023-01-03 DIAGNOSIS — I2510 Atherosclerotic heart disease of native coronary artery without angina pectoris: Secondary | ICD-10-CM | POA: Diagnosis not present

## 2023-01-03 DIAGNOSIS — I739 Peripheral vascular disease, unspecified: Secondary | ICD-10-CM | POA: Diagnosis not present

## 2023-01-10 DIAGNOSIS — E782 Mixed hyperlipidemia: Secondary | ICD-10-CM | POA: Diagnosis not present

## 2023-01-10 DIAGNOSIS — I10 Essential (primary) hypertension: Secondary | ICD-10-CM | POA: Diagnosis not present

## 2023-01-10 DIAGNOSIS — E1165 Type 2 diabetes mellitus with hyperglycemia: Secondary | ICD-10-CM | POA: Diagnosis not present

## 2023-01-26 ENCOUNTER — Other Ambulatory Visit: Payer: Self-pay | Admitting: Cardiovascular Disease

## 2023-01-26 ENCOUNTER — Other Ambulatory Visit: Payer: Self-pay | Admitting: Family

## 2023-01-30 ENCOUNTER — Telehealth: Payer: Self-pay

## 2023-01-30 NOTE — Telephone Encounter (Signed)
General Review Call  [name]  44 years, Female  DOB: 27-May-1961  Kayla: [phone]  __________________________________________________ General Review Kayla Malone) Chart Review Have there been any documented new, changed, or discontinued medications since last visit?  (Include name, dose, frequency, date): Consults: 12/19/22 Urgent Care Kayla Malone, Kayla Malone, Kayla Malone For sore throat . No medication changes. 12/27/22 Kayla Malone - [facility] - Kayla Malone, Kayla Rack  PA-C. For Lumbar radiculopathy and Bilateral carpal tunnel syndrome 12/27/22  Kayla Laming, Kayla Malone For chest pain. STARTED  Indomethacin 25 Malone three times a day. 01/03/23 Kayla Laming, Kayla Malone. For follow-up. STOPPED Indomethacin 25 Malone . Has there been any documented recent hospitalizations or ED visits since last visit with Clinical Lead?: No Adherence Review Does the Kayla Malone have access to medication refill data?: Yes Adherence rates for Kayla Malone medications: Kayla Malone - 12/01/22 90 DS Kayla Malone -11/14/22 90 DS Adherence rates for medications indicated for disease state being reviewed: Kayla Malone - 12/01/22 90 DS Kayla Malone -11/14/22 90 DS Does the patient have >5 day gap between last estimated fill dates for any of the above medications or other medication gaps?: No Disease State Questions Able to connect with the Patient?: Yes Did patient have any problems with their health recently?: No Have you had any admissions or emergency room visits or worsening of your condition(s) since last visit?: No Have you had any visits with new specialists or providers since your last visit?: No Have you had any new health care problem(s) since your last visit?: No Have you run out of any of your medications since you last spoke with your clinical lead?: No Are there any medications you are not taking as prescribed?: No Are you having any issues or side effects with your medications?: No Do you have any other health concerns or questions you want to  discuss with your Clinical lead  before your next visit?: No Are there any health concerns that you feel we can do a better job addressing?: No Any falls since last visit?: No Any increased or uncontrolled pain since last visit?: No Next visit Type:: Phone Visit with:: CP Date:: 07/17/2023 Time:: 2:00 PM  Additional Details: Patient was inquiring about having a nurse while she is recovering from her hand surgery , gave her information and what to tell/ask the doctor at her next appointment on 02/09/23 , Kayla Malone Kayla Malone on 01/30/2023 06:35 PM CP Reviewed - 57mns 36secs Kayla Malone on 01/27/2023 02:39 PM [facility]: 10 min 01/27/23 [facility] time spent: 10 min 01/27/23  CPP Office Visit Documentation: CPP Coordination of Care: CPP Care Plan Review: Kayla Malone on 01/27/2023 02:31 PM R/S appointment to 07/17/23 at 2:00 PM. HBurman Malone 01/13/2023 12:12 PM pt was scheduled, please r/s to june/july Clinical Lead Review Review Adherence gaps identified?: No Drug Therapy Problems identified?: No Assessment: No new needs identified. Follow-up as scheduled

## 2023-02-02 DIAGNOSIS — M791 Myalgia, unspecified site: Secondary | ICD-10-CM | POA: Diagnosis not present

## 2023-02-02 DIAGNOSIS — M5416 Radiculopathy, lumbar region: Secondary | ICD-10-CM | POA: Diagnosis not present

## 2023-02-02 DIAGNOSIS — M5136 Other intervertebral disc degeneration, lumbar region: Secondary | ICD-10-CM | POA: Insufficient documentation

## 2023-02-02 DIAGNOSIS — M545 Low back pain, unspecified: Secondary | ICD-10-CM | POA: Insufficient documentation

## 2023-02-02 DIAGNOSIS — M48062 Spinal stenosis, lumbar region with neurogenic claudication: Secondary | ICD-10-CM | POA: Diagnosis not present

## 2023-02-09 ENCOUNTER — Ambulatory Visit (INDEPENDENT_AMBULATORY_CARE_PROVIDER_SITE_OTHER): Payer: Medicare HMO | Admitting: Family

## 2023-02-09 VITALS — BP 112/60 | HR 58 | Ht 63.0 in | Wt 135.0 lb

## 2023-02-09 DIAGNOSIS — E039 Hypothyroidism, unspecified: Secondary | ICD-10-CM

## 2023-02-09 DIAGNOSIS — E538 Deficiency of other specified B group vitamins: Secondary | ICD-10-CM

## 2023-02-09 DIAGNOSIS — E782 Mixed hyperlipidemia: Secondary | ICD-10-CM

## 2023-02-09 DIAGNOSIS — E1165 Type 2 diabetes mellitus with hyperglycemia: Secondary | ICD-10-CM | POA: Diagnosis not present

## 2023-02-09 DIAGNOSIS — E1159 Type 2 diabetes mellitus with other circulatory complications: Secondary | ICD-10-CM

## 2023-02-09 DIAGNOSIS — I10 Essential (primary) hypertension: Secondary | ICD-10-CM

## 2023-02-09 DIAGNOSIS — E559 Vitamin D deficiency, unspecified: Secondary | ICD-10-CM | POA: Diagnosis not present

## 2023-02-09 LAB — POC CREATINE & ALBUMIN,URINE
Albumin/Creatinine Ratio, Urine, POC: <30
Creatinine, POC: 200 mg/dL
Microalbumin Ur, POC: 10 mg/L

## 2023-02-09 LAB — POCT CBG (FASTING - GLUCOSE)-MANUAL ENTRY: Glucose Fasting, POC: 131 mg/dL — AB (ref 70–99)

## 2023-02-10 LAB — CMP14+EGFR
ALT: 5 IU/L (ref 0–32)
AST: 13 IU/L (ref 0–40)
Albumin/Globulin Ratio: 2 (ref 1.2–2.2)
Albumin: 4.4 g/dL (ref 3.9–4.9)
Alkaline Phosphatase: 91 IU/L (ref 44–121)
BUN/Creatinine Ratio: 9 — ABNORMAL LOW (ref 12–28)
BUN: 10 mg/dL (ref 8–27)
Bilirubin Total: 0.4 mg/dL (ref 0.0–1.2)
CO2: 28 mmol/L (ref 20–29)
Calcium: 9.7 mg/dL (ref 8.7–10.3)
Chloride: 100 mmol/L (ref 96–106)
Creatinine, Ser: 1.08 mg/dL — ABNORMAL HIGH (ref 0.57–1.00)
Globulin, Total: 2.2 g/dL (ref 1.5–4.5)
Glucose: 120 mg/dL — ABNORMAL HIGH (ref 70–99)
Potassium: 3.9 mmol/L (ref 3.5–5.2)
Sodium: 142 mmol/L (ref 134–144)
Total Protein: 6.6 g/dL (ref 6.0–8.5)
eGFR: 58 mL/min/{1.73_m2} — ABNORMAL LOW (ref >59)

## 2023-02-10 LAB — CBC WITH DIFFERENTIAL
Basophils Absolute: 0.1 10*3/uL (ref 0.0–0.2)
Basos: 2 %
EOS (ABSOLUTE): 0 10*3/uL (ref 0.0–0.4)
Eos: 1 %
Hematocrit: 39.5 % (ref 34.0–46.6)
Hemoglobin: 13.2 g/dL (ref 11.1–15.9)
Immature Grans (Abs): 0 10*3/uL (ref 0.0–0.1)
Immature Granulocytes: 0 %
Lymphocytes Absolute: 1.7 10*3/uL (ref 0.7–3.1)
Lymphs: 42 %
MCH: 29.4 pg (ref 26.6–33.0)
MCHC: 33.4 g/dL (ref 31.5–35.7)
MCV: 88 fL (ref 79–97)
Monocytes Absolute: 0.3 10*3/uL (ref 0.1–0.9)
Monocytes: 7 %
Neutrophils Absolute: 2 10*3/uL (ref 1.4–7.0)
Neutrophils: 48 %
RBC: 4.49 x10E6/uL (ref 3.77–5.28)
RDW: 12.6 % (ref 11.7–15.4)
WBC: 4 10*3/uL (ref 3.4–10.8)

## 2023-02-10 LAB — LIPID PANEL
Chol/HDL Ratio: 3 ratio (ref 0.0–4.4)
Cholesterol, Total: 154 mg/dL (ref 100–199)
HDL: 52 mg/dL (ref >39)
LDL Chol Calc (NIH): 87 mg/dL (ref 0–99)
Triglycerides: 81 mg/dL (ref 0–149)
VLDL Cholesterol Cal: 15 mg/dL (ref 5–40)

## 2023-02-10 LAB — VITAMIN B12: Vitamin B-12: 350 pg/mL (ref 232–1245)

## 2023-02-10 LAB — HEMOGLOBIN A1C
Est. average glucose Bld gHb Est-mCnc: 134 mg/dL
Hgb A1c MFr Bld: 6.3 % — ABNORMAL HIGH (ref 4.8–5.6)

## 2023-02-10 LAB — VITAMIN D 25 HYDROXY (VIT D DEFICIENCY, FRACTURES): Vit D, 25-Hydroxy: 14.6 ng/mL — ABNORMAL LOW (ref 30.0–100.0)

## 2023-02-10 LAB — TSH: TSH: 1.43 u[IU]/mL (ref 0.450–4.500)

## 2023-02-13 ENCOUNTER — Encounter: Payer: Self-pay | Admitting: Family

## 2023-02-13 NOTE — Progress Notes (Signed)
Established Patient Office Visit  Subjective:  Patient ID: Kayla Malone, female    DOB: May 04, 1961  Age: 62 y.o. MRN: [id number]  Chief Complaint  Patient presents with   Follow-up    3 month follow up    Patient is here for her 3 month f/u.   She has been feeling well since her last appointment.    Due for labs No other concerns at this time  HM Mammogram         MAMMOGRAM (Every 2 Years) Next due on 11/15/2024   11/15/2022  MM 3D SCREEN BREAST BILATERAL   Only the first 1 history entries have been loaded, but more history  exists.      Past Medical History:  Diagnosis Date   Asthma    Coronary artery disease    Depression    Diabetes mellitus without complication (HCC)    GERD (gastroesophageal reflux disease)    History of hiatal hernia    Hypertension    Pain    BACK   Stroke ([place])    x3   Thyroid cyst     Social History   Socioeconomic History   Marital status: Married    Spouse name: Not on file   Number of children: Not on file   Years of education: Not on file   Highest education level: Not on file  Occupational History   Not on file  Tobacco Use   Smoking status: Former    Types: Cigarettes    Quit date: 09/13/1998    Years since quitting: 24.4   Smokeless tobacco: Never  Vaping Use   Vaping Use: Never used  Substance and Sexual Activity   Alcohol use: No   Drug use: No   Sexual activity: Yes    Birth control/protection: Post-menopausal  Other Topics Concern   Not on file  Social History Narrative   Not on file   Social Determinants of Health   Financial Resource Strain: Not on file  Food Insecurity: Not on file  Transportation Needs: Not on file  Physical Activity: Not on file  Stress: Not on file  Social Connections: Not on file  Intimate Partner Violence: Not on file    Family History  Problem Relation Age of Onset   Heart failure Mother    Pulmonary embolism Mother    Cirrhosis Father    Coronary artery  disease Father    Diabetes type II Father    Hypertension Father    Kidney disease Father    Coronary artery disease Sister    Stroke Sister    COPD Brother    Coronary artery disease Brother    Breast cancer Neg Hx     Allergies  Allergen Reactions   Lisinopril Swelling   Tramadol Other (See Comments) and Nausea Only    sleepiness Other reaction(s): Dizziness, Other, Other (See Comments) somnolence sleepiness Other reaction(s): Other (See Comments), Other (See Comments) Would not wake up after taking this medication somnolence sleepiness sleepiness    Amoxicillin Rash    Don't remember  Don't remember  Don't remember    Clavulanic Acid Rash    Review of Systems  Neurological:  Positive for weakness.  All other systems reviewed and are negative.      Objective:   BP 112/60   Pulse (!) 58   Wt 135 lb (61.2 kg)   SpO2 99%   BMI 23.91 kg/m   Vitals:   02/09/23 0940  BP: 112/60  Pulse: (!) 58  Weight: 135 lb (61.2 kg)  SpO2: 99%    Physical Exam Vitals and nursing note reviewed.  Constitutional:      Appearance: Normal appearance. She is normal weight.  HENT:     Head: Normocephalic.  Eyes:     Pupils: Pupils are equal, round, and reactive to light.  Cardiovascular:     Rate and Rhythm: Normal rate and regular rhythm.     Pulses: Normal pulses.     Heart sounds: Normal heart sounds.  Pulmonary:     Effort: Pulmonary effort is normal.     Breath sounds: Normal breath sounds.  Neurological:     General: No focal deficit present.     Mental Status: She is alert and oriented to person, place, and time.  Psychiatric:        Mood and Affect: Mood normal.        Behavior: Behavior normal.        Thought Content: Thought content normal.        Judgment: Judgment normal.      Results for orders placed or performed in visit on 02/09/23  Lipid panel  Result Value Ref Range   Cholesterol, Total 154 100 - 199 mg/dL   Triglycerides 81 0 - 149 mg/dL    HDL 52 >39 mg/dL   VLDL Cholesterol Cal 15 5 - 40 mg/dL   LDL Chol Calc (NIH) 87 0 - 99 mg/dL   Chol/HDL Ratio 3.0 0.0 - 4.4 ratio  VITAMIN D 25 Hydroxy (Vit-D Deficiency, Fractures)  Result Value Ref Range   Vit D, 25-Hydroxy 14.6 (L) 30.0 - 100.0 ng/mL  CBC With Differential  Result Value Ref Range   WBC 4.0 3.4 - 10.8 x10E3/uL   RBC 4.49 3.77 - 5.28 x10E6/uL   Hemoglobin 13.2 11.1 - 15.9 g/dL   Hematocrit 39.5 34.0 - 46.6 %   MCV 88 79 - 97 fL   MCH 29.4 26.6 - 33.0 pg   MCHC 33.4 31.5 - 35.7 g/dL   RDW 12.6 11.7 - 15.4 %   Neutrophils 48 Not Estab. %   Lymphs 42 Not Estab. %   Monocytes 7 Not Estab. %   Eos 1 Not Estab. %   Basos 2 Not Estab. %   Neutrophils Absolute 2.0 1.4 - 7.0 x10E3/uL   Lymphocytes Absolute 1.7 0.7 - 3.1 x10E3/uL   Monocytes Absolute 0.3 0.1 - 0.9 x10E3/uL   EOS (ABSOLUTE) 0.0 0.0 - 0.4 x10E3/uL   Basophils Absolute 0.1 0.0 - 0.2 x10E3/uL   Immature Granulocytes 0 Not Estab. %   Immature Grans (Abs) 0.0 0.0 - 0.1 x10E3/uL  CMP14+EGFR  Result Value Ref Range   Glucose 120 (H) 70 - 99 mg/dL   BUN 10 8 - 27 mg/dL   Creatinine, Ser 1.08 (H) 0.57 - 1.00 mg/dL   eGFR 58 (L) >59 mL/min/1.73   BUN/Creatinine Ratio 9 (L) 12 - 28   Sodium 142 134 - 144 mmol/L   Potassium 3.9 3.5 - 5.2 mmol/L   Chloride 100 96 - 106 mmol/L   CO2 28 20 - 29 mmol/L   Calcium 9.7 8.7 - 10.3 mg/dL   Total Protein 6.6 6.0 - 8.5 g/dL   Albumin 4.4 3.9 - 4.9 g/dL   Globulin, Total 2.2 1.5 - 4.5 g/dL   Albumin/Globulin Ratio 2.0 1.2 - 2.2   Bilirubin Total 0.4 0.0 - 1.2 mg/dL   Alkaline Phosphatase 91 44 - 121 IU/L   AST 13  0 - 40 IU/L   ALT 5 0 - 32 IU/L  TSH  Result Value Ref Range   TSH 1.430 0.450 - 4.500 uIU/mL  Hemoglobin A1c  Result Value Ref Range   Hgb A1c MFr Bld 6.3 (H) 4.8 - 5.6 %   Est. average glucose Bld gHb Est-mCnc 134 mg/dL  Vitamin B12  Result Value Ref Range   Vitamin B-12 350 232 - 1,245 pg/mL  POCT CBG (Fasting - Glucose)  Result Value Ref  Range   Glucose Fasting, POC 131 (A) 70 - 99 mg/dL  POC CREATINE & ALBUMIN,URINE  Result Value Ref Range   Microalbumin Ur, POC 10 mg/L   Creatinine, POC 200 mg/dL   Albumin/Creatinine Ratio, Urine, POC <30     Recent Results (from the past 2160 hour(s))  POCT CBG (Fasting - Glucose)     Status: Abnormal   Collection Time: 02/09/23  9:44 AM  Result Value Ref Range   Glucose Fasting, POC 131 (A) 70 - 99 mg/dL  POC CREATINE & ALBUMIN,URINE     Status: Normal   Collection Time: 02/09/23 10:14 AM  Result Value Ref Range   Microalbumin Ur, POC 10 mg/L   Creatinine, POC 200 mg/dL   Albumin/Creatinine Ratio, Urine, POC <30   Lipid panel     Status: None   Collection Time: 02/09/23 10:27 AM  Result Value Ref Range   Cholesterol, Total 154 100 - 199 mg/dL   Triglycerides 81 0 - 149 mg/dL   HDL 52 >39 mg/dL   VLDL Cholesterol Cal 15 5 - 40 mg/dL   LDL Chol Calc (NIH) 87 0 - 99 mg/dL   Chol/HDL Ratio 3.0 0.0 - 4.4 ratio    Comment:                                   T. Chol/HDL Ratio                                             Men  Women                               1/2 Avg.Risk  3.4    3.3                                   Avg.Risk  5.0    4.4                                2X Avg.Risk  9.6    7.1                                3X Avg.Risk 23.4   11.0   VITAMIN D 25 Hydroxy (Vit-D Deficiency, Fractures)     Status: Abnormal   Collection Time: 02/09/23 10:27 AM  Result Value Ref Range   Vit D, 25-Hydroxy 14.6 (L) 30.0 - 100.0 ng/mL    Comment: Vitamin D deficiency has been defined by the [place] practice guideline as a level of serum 25-OH  vitamin D less than 20 ng/mL (1,2). The Endocrine Society went on to further define vitamin D insufficiency as a level between 21 and 29 ng/mL (2). 1. IOM (Institute of Medicine). 2010. Dietary reference    intakes for calcium and D. [place]: The    [employer]. 2. Holick MF, [place],  Bischoff-Ferrari HA, et al.    Evaluation, treatment, and prevention of vitamin D    deficiency: an Endocrine Society clinical practice    guideline. JCEM. 2011 Jul; 96(7):1911-30.   CBC With Differential     Status: None   Collection Time: 02/09/23 10:27 AM  Result Value Ref Range   WBC 4.0 3.4 - 10.8 x10E3/uL   RBC 4.49 3.77 - 5.28 x10E6/uL   Hemoglobin 13.2 11.1 - 15.9 g/dL   Hematocrit 39.5 34.0 - 46.6 %   MCV 88 79 - 97 fL   MCH 29.4 26.6 - 33.0 pg   MCHC 33.4 31.5 - 35.7 g/dL   RDW 12.6 11.7 - 15.4 %   Neutrophils 48 Not Estab. %   Lymphs 42 Not Estab. %   Monocytes 7 Not Estab. %   Eos 1 Not Estab. %   Basos 2 Not Estab. %   Neutrophils Absolute 2.0 1.4 - 7.0 x10E3/uL   Lymphocytes Absolute 1.7 0.7 - 3.1 x10E3/uL   Monocytes Absolute 0.3 0.1 - 0.9 x10E3/uL   EOS (ABSOLUTE) 0.0 0.0 - 0.4 x10E3/uL   Basophils Absolute 0.1 0.0 - 0.2 x10E3/uL   Immature Granulocytes 0 Not Estab. %   Immature Grans (Abs) 0.0 0.0 - 0.1 x10E3/uL  CMP14+EGFR     Status: Abnormal   Collection Time: 02/09/23 10:27 AM  Result Value Ref Range   Glucose 120 (H) 70 - 99 mg/dL   BUN 10 8 - 27 mg/dL   Creatinine, Ser 1.08 (H) 0.57 - 1.00 mg/dL   eGFR 58 (L) >59 mL/min/1.73   BUN/Creatinine Ratio 9 (L) 12 - 28   Sodium 142 134 - 144 mmol/L   Potassium 3.9 3.5 - 5.2 mmol/L   Chloride 100 96 - 106 mmol/L   CO2 28 20 - 29 mmol/L   Calcium 9.7 8.7 - 10.3 mg/dL   Total Protein 6.6 6.0 - 8.5 g/dL   Albumin 4.4 3.9 - 4.9 g/dL   Globulin, Total 2.2 1.5 - 4.5 g/dL   Albumin/Globulin Ratio 2.0 1.2 - 2.2   Bilirubin Total 0.4 0.0 - 1.2 mg/dL   Alkaline Phosphatase 91 44 - 121 IU/L   AST 13 0 - 40 IU/L   ALT 5 0 - 32 IU/L  TSH     Status: None   Collection Time: 02/09/23 10:27 AM  Result Value Ref Range   TSH 1.430 0.450 - 4.500 uIU/mL  Hemoglobin A1c     Status: Abnormal   Collection Time: 02/09/23 10:27 AM  Result Value Ref Range   Hgb A1c MFr Bld 6.3 (H) 4.8 - 5.6 %    Comment:           Prediabetes: 5.7 - 6.4          Diabetes: >6.4          Glycemic control for adults with diabetes: <7.0    Est. average glucose Bld gHb Est-mCnc 134 mg/dL  Vitamin B12     Status: None   Collection Time: 02/09/23 10:27 AM  Result Value Ref Range   Vitamin B-12 350 232 - 1,245 pg/mL      Assessment & Plan:  Problem List Items Addressed This Visit     Diabetes ([place]) - Primary   Relevant Orders   POCT CBG (Fasting - Glucose) (Completed)   CBC With Differential (Completed)   CMP14+EGFR (Completed)   Hemoglobin A1c (Completed)   POC CREATINE & ALBUMIN,URINE (Completed)   Hyperlipidemia, unspecified   Relevant Orders   Lipid panel (Completed)   CBC With Differential (Completed)   CMP14+EGFR (Completed)   HTN (hypertension)   Relevant Orders   CBC With Differential (Completed)   CMP14+EGFR (Completed)   Other Visit Diagnoses     Vitamin D deficiency, unspecified       Relevant Orders   VITAMIN D 25 Hydroxy (Vit-D Deficiency, Fractures) (Completed)   CBC With Differential (Completed)   CMP14+EGFR (Completed)   Hypothyroidism (acquired)       Relevant Orders   CBC With Differential (Completed)   CMP14+EGFR (Completed)   TSH (Completed)   B12 deficiency due to diet       Relevant Orders   CBC With Differential (Completed)   CMP14+EGFR (Completed)   Vitamin B12 (Completed)       Return in about 3 months (around 05/10/2023).   Total time spent: 30 minutes  Mechele Claude, FNP  02/09/2023

## 2023-02-16 ENCOUNTER — Telehealth: Payer: Self-pay

## 2023-02-16 DIAGNOSIS — G5601 Carpal tunnel syndrome, right upper limb: Secondary | ICD-10-CM | POA: Diagnosis not present

## 2023-02-16 DIAGNOSIS — E1165 Type 2 diabetes mellitus with hyperglycemia: Secondary | ICD-10-CM

## 2023-02-16 NOTE — Telephone Encounter (Signed)
Called pt to inform her I faxed paperwork, she asked about a referral to podiatry- about a diabetic foot exam? Mentioned feet tingling, said she mentioned at appt but wanted me to remind you please advise

## 2023-02-17 ENCOUNTER — Encounter: Payer: Self-pay | Admitting: Family

## 2023-02-17 MED ORDER — VITAMIN D (ERGOCALCIFEROL) 1.25 MG (50000 UNIT) PO CAPS: 50000.0000 [IU] | ORAL_CAPSULE | ORAL | 1 refills | Status: DC

## 2023-02-17 NOTE — Addendum Note (Signed)
Addended by: Georgian Co on: 02/17/2023 09:53 AM   Modules accepted: Orders

## 2023-02-18 ENCOUNTER — Emergency Department
Admission: EM | Admit: 2023-02-18 | Discharge: 2023-02-19 | Disposition: A | Discharge status: Home / Self Care | Payer: Medicare HMO | Attending: Emergency Medicine | Admitting: Emergency Medicine

## 2023-02-18 ENCOUNTER — Other Ambulatory Visit: Payer: Self-pay

## 2023-02-18 DIAGNOSIS — I2510 Atherosclerotic heart disease of native coronary artery without angina pectoris: Secondary | ICD-10-CM | POA: Insufficient documentation

## 2023-02-18 DIAGNOSIS — Z20822 Contact with and (suspected) exposure to covid-19: Secondary | ICD-10-CM | POA: Diagnosis not present

## 2023-02-18 DIAGNOSIS — Z951 Presence of aortocoronary bypass graft: Secondary | ICD-10-CM | POA: Diagnosis not present

## 2023-02-18 DIAGNOSIS — E876 Hypokalemia: Secondary | ICD-10-CM | POA: Insufficient documentation

## 2023-02-18 DIAGNOSIS — R0789 Other chest pain: Secondary | ICD-10-CM | POA: Insufficient documentation

## 2023-02-18 DIAGNOSIS — R079 Chest pain, unspecified: Secondary | ICD-10-CM | POA: Diagnosis not present

## 2023-02-18 DIAGNOSIS — E119 Type 2 diabetes mellitus without complications: Secondary | ICD-10-CM | POA: Insufficient documentation

## 2023-02-18 NOTE — ED Provider Notes (Signed)
[facility] Provider Note    Event Date/Time   First MD Initiated Contact with Patient 02/18/23 2355     (approximate)   History   Chest Pain   HPI  Kayla Malone is a 62 y.o. female who presents to the ED for evaluation of Chest Pain   I reviewed PCP visit from 2/29.  History of CAD s/p CABG, GERD, DM, stroke.  I reviewed left heart cath from July 2023.  Patent LIMA to LAD, but occluded graft to PDA.  Patent stents to RCA.  Normal EF.  Patient presents to the ED for evaluation of a pressure and tingling sensation to her chest and bilateral neck.  Symptoms started tonight as she was laying in bed shortly after taking her evening medications.  Remote history of GERD but reports the sensations are not consistent with reflux that she has had in the past.  Symptoms were prominent at home for a few minutes and have waned but persisted over the past couple hours since then.  No recurrence of anything more severe.  No dyspnea, cough, fever, syncope, abdominal pain, emesis   Physical Exam   Triage Vital Signs: ED Triage Vitals  Enc Vitals Group     BP 02/18/23 2353 109/70     Pulse Rate 02/18/23 2353 60     Resp 02/18/23 2353 18     Temp 02/18/23 2353 97.9 F (36.6 C)     Temp Source 02/18/23 2353 Oral     SpO2 02/18/23 2353 96 %     Weight 02/18/23 2350 130 lb (59 kg)     Height --      Head Circumference --      Peak Flow --      Pain Score 02/18/23 2350 3     Pain Loc --      Pain Edu? --      Excl. in [place]? --     Most recent vital signs: Vitals:   02/19/23 0400 02/19/23 0422  BP: (!) 146/78   Pulse: (!) 57   Resp: 17   Temp:  98.1 F (36.7 C)  SpO2: 97%     General: Awake, no distress.  CV:  Good peripheral perfusion.  Resp:  Normal effort.  Abd:  No distention.  MSK:  No deformity noted.  Neuro:  No focal deficits appreciated. Other:     ED Results / Procedures / Treatments   Labs (all labs ordered are listed, but  only abnormal results are displayed) Labs Reviewed  COMPREHENSIVE METABOLIC PANEL - Abnormal; Notable for the following components:      Result Value   Sodium 133 (*)    Potassium 2.7 (*)    Chloride 96 (*)    Glucose, Bld 151 (*)    Creatinine, Ser 1.10 (*)    GFR, Estimated 57 (*)    All other components within normal limits  TROPONIN I (HIGH SENSITIVITY) - Abnormal; Notable for the following components:   Troponin I (High Sensitivity) 18 (*)    All other components within normal limits  RESP PANEL BY RT-PCR (RSV, FLU A&B, COVID)  [id number]  CBC WITH DIFFERENTIAL/PLATELET  LIPASE, BLOOD  BRAIN NATRIURETIC PEPTIDE  MAGNESIUM  TROPONIN I (HIGH SENSITIVITY)    EKG Sinus rhythm with a rate of 60 bpm.  Rightward axis and normal intervals.  Nonspecific ST changes with slightly biphasic T waves inferior leads.  No clear STEMI.  RADIOLOGY CXR interpreted by me without evidence  of acute cardiopulmonary pathology.  Official radiology report(s): DG Chest 2 View  Result Date: 02/19/2023 CLINICAL DATA:  Chest pain. EXAM: CHEST - 2 VIEW COMPARISON:  Chest radiograph dated 07/02/2022. FINDINGS: No focal consolidation, pleural effusion, or pneumothorax. The cardiac silhouette is within limits. Median sternotomy wires. Atherosclerotic calcification of the aorta. No acute osseous pathology. Degenerative changes of the spine. IMPRESSION: No active cardiopulmonary disease. Electronically Signed   By: Anner Crete M.D.   On: 02/19/2023 00:26    PROCEDURES and INTERVENTIONS:  .1-3 Lead EKG Interpretation  Performed by: Vladimir Crofts, MD Authorized by: Vladimir Crofts, MD     Interpretation: normal     ECG rate:  61   ECG rate assessment: normal     Rhythm: sinus rhythm     Ectopy: none     Conduction: normal     Medications  nitroGLYCERIN (NITROSTAT) SL tablet 0.4 mg (0.4 mg Sublingual Given 02/19/23 0050)  potassium chloride 10 mEq in 100 mL IVPB (10 mEq Intravenous New Bag/Given 02/19/23  0302)  aspirin chewable tablet 324 mg (324 mg Oral Given 02/19/23 0050)  potassium chloride SA (KLOR-CON M) CR tablet 40 mEq (40 mEq Oral Given 02/19/23 0144)  potassium chloride SA (KLOR-CON M) CR tablet 40 mEq (40 mEq Oral Given 02/19/23 0347)     IMPRESSION / MDM / ASSESSMENT AND PLAN / ED COURSE  I reviewed the triage vital signs and the nursing notes.  Differential diagnosis includes, but is not limited to, ACS, PTX, PNA, muscle strain/spasm, PE, dissection, electrolyte derangement  {Patient presents with symptoms of an acute illness or injury that is potentially life-threatening.  62 year old woman with history of CAD and CABG presents with atypical chest discomfort and tingling, found to have evidence of hypokalemia.  EKG is nonischemic and first troponin is just marginally elevated.  Metabolic panel with hypokalemia that we will replete orally and IV.  Normal CBC, lipase, BNP and magnesium.  CXR is clear.  We will keep her on the monitor, check a second troponin as we replete her potassium and assess response to treatment.  She is fairly high risk from a cardiac perspective and unless her symptoms resolved completely and has a benign workup, she may require observation admission.  Her second troponin is downtrending/essentially flat.  She has resolution of symptoms with potassium repletion.  With this reassuring workup, being asymptomatic, symptoms not improving with nitroglycerin I am more doubtful of cardiac etiology of her symptoms.  She would like to go home and I think this is reasonable.  We discussed close outpatient follow-up with her PCP for potassium recheck and we discussed return precautions for the ED.  Clinical Course as of 02/19/23 0439  Nancy Fetter Feb 19, 2023  0101 Reassessed. No change with the NTG [DS]  0114 Update patient of hypokalemia [DS]  0436 Reassessed.  Resolution of symptoms and feeling well.  No complaints.  No chest or neck discomfort, pressure or other symptoms.   She is appreciative. [DS]    Clinical Course User Index [DS] Vladimir Crofts, MD     FINAL CLINICAL IMPRESSION(S) / ED DIAGNOSES   Final diagnoses:  Hypokalemia  Other chest pain  Hx of CABG     Rx / DC Orders   ED Discharge Orders     None        Note:  This document was prepared using Dragon voice recognition software and may include unintentional dictation errors.   Vladimir Crofts, MD 02/19/23 [phone]

## 2023-02-18 NOTE — ED Triage Notes (Signed)
Pt presents to ER with c/o central chest pain that started around 1 hr ago.  Pt states she was laying down when pain started.  Pt states pain radiates from her central chest to her lower jaw area and is intermittent in nature.  Pt has hx of CABG x3 and 2 strokes in past.  Pt states she does take plavix at home.  Pt is otherwise A&O x4 and in NAD at this time.

## 2023-02-19 ENCOUNTER — Other Ambulatory Visit: Payer: Medicare HMO

## 2023-02-19 ENCOUNTER — Emergency Department: Payer: Medicare HMO

## 2023-02-19 DIAGNOSIS — R0789 Other chest pain: Secondary | ICD-10-CM | POA: Diagnosis not present

## 2023-02-19 DIAGNOSIS — R079 Chest pain, unspecified: Secondary | ICD-10-CM | POA: Diagnosis not present

## 2023-02-19 LAB — RESP PANEL BY RT-PCR (RSV, FLU A&B, COVID)  RVPGX2
Influenza A by PCR: NEGATIVE
Influenza B by PCR: NEGATIVE
Resp Syncytial Virus by PCR: NEGATIVE
SARS Coronavirus 2 by RT PCR: NEGATIVE

## 2023-02-19 LAB — LIPASE, BLOOD: Lipase: 47 U/L (ref 11–51)

## 2023-02-19 LAB — MAGNESIUM: Magnesium: 1.7 mg/dL (ref 1.7–2.4)

## 2023-02-19 LAB — CBC WITH DIFFERENTIAL/PLATELET
Abs Immature Granulocytes: 0.01 10*3/uL (ref 0.00–0.07)
Basophils Absolute: 0.1 10*3/uL (ref 0.0–0.1)
Basophils Relative: 1 %
Eosinophils Absolute: 0.1 10*3/uL (ref 0.0–0.5)
Eosinophils Relative: 1 %
HCT: 37.3 % (ref 36.0–46.0)
Hemoglobin: 12.5 g/dL (ref 12.0–15.0)
Immature Granulocytes: 0 %
Lymphocytes Relative: 44 %
Lymphs Abs: 2.5 10*3/uL (ref 0.7–4.0)
MCH: 28.5 pg (ref 26.0–34.0)
MCHC: 33.5 g/dL (ref 30.0–36.0)
MCV: 85 fL (ref 80.0–100.0)
Monocytes Absolute: 0.4 10*3/uL (ref 0.1–1.0)
Monocytes Relative: 8 %
Neutro Abs: 2.8 10*3/uL (ref 1.7–7.7)
Neutrophils Relative %: 46 %
Platelets: 298 10*3/uL (ref 150–400)
RBC: 4.39 MIL/uL (ref 3.87–5.11)
RDW: 12.6 % (ref 11.5–15.5)
WBC: 5.8 10*3/uL (ref 4.0–10.5)
nRBC: 0 % (ref 0.0–0.2)

## 2023-02-19 LAB — COMPREHENSIVE METABOLIC PANEL
ALT: 9 U/L (ref 0–44)
AST: 16 U/L (ref 15–41)
Albumin: 4.1 g/dL (ref 3.5–5.0)
Alkaline Phosphatase: 77 U/L (ref 38–126)
Anion gap: 10 (ref 5–15)
BUN: 12 mg/dL (ref 8–23)
CO2: 27 mmol/L (ref 22–32)
Calcium: 9.2 mg/dL (ref 8.9–10.3)
Chloride: 96 mmol/L — ABNORMAL LOW (ref 98–111)
Creatinine, Ser: 1.1 mg/dL — ABNORMAL HIGH (ref 0.44–1.00)
GFR, Estimated: 57 mL/min — ABNORMAL LOW (ref >60)
Glucose, Bld: 151 mg/dL — ABNORMAL HIGH (ref 70–99)
Potassium: 2.7 mmol/L — CL (ref 3.5–5.1)
Sodium: 133 mmol/L — ABNORMAL LOW (ref 135–145)
Total Bilirubin: 0.8 mg/dL (ref 0.3–1.2)
Total Protein: 7.4 g/dL (ref 6.5–8.1)

## 2023-02-19 LAB — TROPONIN I (HIGH SENSITIVITY)
Troponin I (High Sensitivity): 16 ng/L (ref <18)
Troponin I (High Sensitivity): 18 ng/L — ABNORMAL HIGH (ref <18)

## 2023-02-19 LAB — BRAIN NATRIURETIC PEPTIDE: B Natriuretic Peptide: 16 pg/mL (ref 0.0–100.0)

## 2023-02-19 MED ORDER — POTASSIUM CHLORIDE CRYS ER 20 MEQ PO TBCR
40.0000 meq | EXTENDED_RELEASE_TABLET | Freq: Once | ORAL | Status: AC
  Administered 2023-02-19: 40 meq via ORAL
  Filled 2023-02-19: qty 2

## 2023-02-19 MED ORDER — POTASSIUM CHLORIDE 10 MEQ/100ML IV SOLN
10.0000 meq | INTRAVENOUS | Status: AC
  Administered 2023-02-19: 10 meq via INTRAVENOUS
  Filled 2023-02-19: qty 100

## 2023-02-19 MED ORDER — ASPIRIN 81 MG PO CHEW
324.0000 mg | CHEWABLE_TABLET | Freq: Once | ORAL | Status: AC
  Administered 2023-02-19: 324 mg via ORAL
  Filled 2023-02-19: qty 4

## 2023-02-19 MED ORDER — NITROGLYCERIN 0.4 MG SL SUBL
0.4000 mg | SUBLINGUAL_TABLET | SUBLINGUAL | Status: DC | PRN
  Administered 2023-02-19: 0.4 mg via SUBLINGUAL
  Filled 2023-02-19: qty 1

## 2023-02-20 ENCOUNTER — Ambulatory Visit (INDEPENDENT_AMBULATORY_CARE_PROVIDER_SITE_OTHER): Payer: Medicare HMO | Admitting: Family

## 2023-02-20 ENCOUNTER — Encounter: Payer: Self-pay | Admitting: Family

## 2023-02-20 ENCOUNTER — Ambulatory Visit (INDEPENDENT_AMBULATORY_CARE_PROVIDER_SITE_OTHER): Payer: Medicare HMO | Admitting: Cardiovascular Disease

## 2023-02-20 ENCOUNTER — Encounter: Payer: Self-pay | Admitting: Cardiovascular Disease

## 2023-02-20 VITALS — BP 112/60 | HR 68 | Ht 64.0 in | Wt 130.0 lb

## 2023-02-20 VITALS — BP 120/70 | HR 69 | Ht 63.0 in | Wt 130.6 lb

## 2023-02-20 DIAGNOSIS — I10 Essential (primary) hypertension: Secondary | ICD-10-CM | POA: Diagnosis not present

## 2023-02-20 DIAGNOSIS — R079 Chest pain, unspecified: Secondary | ICD-10-CM | POA: Diagnosis not present

## 2023-02-20 DIAGNOSIS — E876 Hypokalemia: Secondary | ICD-10-CM | POA: Diagnosis not present

## 2023-02-20 DIAGNOSIS — I739 Peripheral vascular disease, unspecified: Secondary | ICD-10-CM | POA: Diagnosis not present

## 2023-02-20 DIAGNOSIS — E1165 Type 2 diabetes mellitus with hyperglycemia: Secondary | ICD-10-CM | POA: Diagnosis not present

## 2023-02-20 DIAGNOSIS — I25811 Atherosclerosis of native coronary artery of transplanted heart without angina pectoris: Secondary | ICD-10-CM

## 2023-02-20 DIAGNOSIS — I69359 Hemiplegia and hemiparesis following cerebral infarction affecting unspecified side: Secondary | ICD-10-CM | POA: Diagnosis not present

## 2023-02-20 DIAGNOSIS — T82857D Stenosis of cardiac prosthetic devices, implants and grafts, subsequent encounter: Secondary | ICD-10-CM

## 2023-02-20 DIAGNOSIS — R0789 Other chest pain: Secondary | ICD-10-CM

## 2023-02-20 LAB — POCT CBG (FASTING - GLUCOSE)-MANUAL ENTRY: Glucose Fasting, POC: 98 mg/dL (ref 70–99)

## 2023-02-20 NOTE — Patient Instructions (Signed)
Continue taking pottasium 20 eq daily

## 2023-02-20 NOTE — Progress Notes (Signed)
Established Patient Office Visit  Subjective:  Patient ID: Kayla Malone, female    DOB: 1961/08/20  Age: 62 y.o. MRN: [id number]  No chief complaint on file.   Hospital follow up.   Patient went to the hospital last week with issues with her heart.  She had lain down for bed, and was having chest pressure/pain and ended up going to the ED.   Potassium was extremely low despite it having been normal when I checked it only a week earlier.   They asked her to follow up with cardiology additionally, to be checked and make sure her heart is okay.   No other concerns today.      Past Medical History:  Diagnosis Date   Adenomatous colon polyp 02/20/2014   Asthma    Benign neoplasm of colon 02/20/2014   Chest pain 09/13/2013   Coronary angioplasty status 11/13/2018   Coronary artery disease    Depression    Diabetes mellitus without complication (HCC)    GERD (gastroesophageal reflux disease)    History of hiatal hernia    Hypertension    Hypokalemia 05/29/2019   Hypomagnesemia 07/02/2022   Nausea with vomiting, unspecified 06/07/2019   NSTEMI (non-ST elevated myocardial infarction) ([place]) 09/23/2018   Pain    BACK   Stroke ([place])    x3   Thyroid cyst    TIA (transient ischemic attack) 08/06/2017   Unstable angina ([place]) 10/20/2017   Weight loss 06/07/2019    Past Surgical History:  Procedure Laterality Date   ABDOMINAL HYSTERECTOMY     CARDIAC SURGERY     CATARACT EXTRACTION W/PHACO Left 08/15/2017   Procedure: CATARACT EXTRACTION PHACO AND INTRAOCULAR LENS PLACEMENT ([place]);  Surgeon: Birder Robson, MD;  Location: [facility];  Service: Ophthalmology;  Laterality: Left;  [place] 00:40.6 AP% 7.3 CDE 2.98 FLUID PACK LOT # [id number]   CORONARY ARTERY BYPASS GRAFT N/A 10/23/2017   Procedure: CORONARY ARTERY BYPASS GRAFTING (CABG) x two  , using left internal mammary artery and right leg greater saphenous vein harvested  endoscopically - LIMA to LAD, SVG to RCA;   Surgeon: Grace Isaac, MD;  Location: [facility];  Service: Open Heart Surgery;  Laterality: N/A;   CORONARY STENT INTERVENTION N/A 09/24/2018   Procedure: CORONARY STENT INTERVENTION;  Surgeon: Jettie Booze, MD;  Location: [facility];  Service: Cardiovascular;  Laterality: N/A;   ESOPHAGOGASTRODUODENOSCOPY N/A 05/30/2019   Procedure: ESOPHAGOGASTRODUODENOSCOPY (EGD);  Surgeon: Toledo, Benay Pike, MD;  Location: [facility];  Service: Gastroenterology;  Laterality: N/A;   EYE SURGERY     LEFT HEART CATH AND CORONARY ANGIOGRAPHY Left 10/09/2017   Procedure: LEFT HEART CATH AND CORONARY ANGIOGRAPHY;  Surgeon: Dionisio [name], MD;  Location: [facility];  Service: Cardiovascular;  Laterality: Left;   LEFT HEART CATH AND CORS/GRAFTS ANGIOGRAPHY N/A 09/24/2018   Procedure: LEFT HEART CATH AND CORS/GRAFTS ANGIOGRAPHY;  Surgeon: Jettie Booze, MD;  Location: [facility];  Service: Cardiovascular;  Laterality: N/A;   LEFT HEART CATH AND CORS/GRAFTS ANGIOGRAPHY N/A 07/04/2022   Procedure: LEFT HEART CATH AND CORS/GRAFTS ANGIOGRAPHY;  Surgeon: Dionisio [name], MD;  Location: [facility];  Service: Cardiovascular;  Laterality: N/A;   TEE WITHOUT CARDIOVERSION N/A 10/23/2017   Procedure: TRANSESOPHAGEAL ECHOCARDIOGRAM (TEE);  Surgeon: Grace Isaac, MD;  Location: [facility];  Service: Open Heart Surgery;  Laterality: N/A;    Social History   Socioeconomic History   Marital status: Married  Spouse name: Not on file   Number of children: Not on file   Years of education: Not on file   Highest education level: Not on file  Occupational History   Not on file  Tobacco Use   Smoking status: Former    Types: Cigarettes    Quit date: 09/13/1998    Years since quitting: 24.4   Smokeless tobacco: Never  Vaping Use   Vaping Use: Never used  Substance and Sexual Activity   Alcohol use: No   Drug use: No   Sexual activity: Yes    Birth  control/protection: Post-menopausal  Other Topics Concern   Not on file  Social History Narrative   Not on file   Social Determinants of Health   Financial Resource Strain: Not on file  Food Insecurity: Not on file  Transportation Needs: Not on file  Physical Activity: Not on file  Stress: Not on file  Social Connections: Not on file  Intimate Partner Violence: Not on file    Family History  Problem Relation Age of Onset   Heart failure Mother    Pulmonary embolism Mother    Cirrhosis Father    Coronary artery disease Father    Diabetes type II Father    Hypertension Father    Kidney disease Father    Coronary artery disease Sister    Stroke Sister    COPD Brother    Coronary artery disease Brother    Breast cancer Neg Hx     Allergies  Allergen Reactions   Lisinopril Swelling and Shortness Of Breath   Meloxicam Nausea Only and Nausea And Vomiting   Tramadol Nausea Only and Other (See Comments)    sleepiness  Other reaction(s): Dizziness, Other, Other (See Comments)  somnolence  Other reaction(s): Other (See Comments), Other (See Comments)  Would not wake up after taking this medication   Amoxicillin Rash    Don't remember  Don't remember  Don't remember    Amoxicillin-Pot Clavulanate Hives and Rash   Clavulanic Acid Rash    Review of Systems  Cardiovascular:  Positive for chest pain and palpitations.  All other systems reviewed and are negative.      Objective:   BP 112/60   Pulse 68   Ht '5\' 4"'$  (1.626 m)   Wt 130 lb (59 kg)   SpO2 99%   BMI 22.31 kg/m   Vitals:   02/20/23 1402  BP: 112/60  Pulse: 68  Height: '5\' 4"'$  (1.626 m)  Weight: 130 lb (59 kg)  SpO2: 99%  BMI (Calculated): 22.3    Physical Exam Vitals and nursing note reviewed.  Constitutional:      Appearance: Normal appearance. She is normal weight.  HENT:     Head: Normocephalic.  Eyes:     Pupils: Pupils are equal, round, and reactive to light.  Neurological:      Mental Status: She is alert.      Results for orders placed or performed in visit on 02/20/23  POCT CBG (Fasting - Glucose)  Result Value Ref Range   Glucose Fasting, POC 98 70 - 99 mg/dL    Recent Results (from the past 2160 hour(s))  POCT CBG (Fasting - Glucose)     Status: Abnormal   Collection Time: 02/09/23  9:44 AM  Result Value Ref Range   Glucose Fasting, POC 131 (A) 70 - 99 mg/dL  POC CREATINE & ALBUMIN,URINE     Status: Normal   Collection Time: 02/09/23 10:14 AM  Result Value Ref Range   Microalbumin Ur, POC 10 mg/L   Creatinine, POC 200 mg/dL   Albumin/Creatinine Ratio, Urine, POC <30   Lipid panel     Status: None   Collection Time: 02/09/23 10:27 AM  Result Value Ref Range   Cholesterol, Total 154 100 - 199 mg/dL   Triglycerides 81 0 - 149 mg/dL   HDL 52 >39 mg/dL   VLDL Cholesterol Cal 15 5 - 40 mg/dL   LDL Chol Calc (NIH) 87 0 - 99 mg/dL   Chol/HDL Ratio 3.0 0.0 - 4.4 ratio    Comment:                                   T. Chol/HDL Ratio                                             Men  Women                               1/2 Avg.Risk  3.4    3.3                                   Avg.Risk  5.0    4.4                                2X Avg.Risk  9.6    7.1                                3X Avg.Risk 23.4   11.0   VITAMIN D 25 Hydroxy (Vit-D Deficiency, Fractures)     Status: Abnormal   Collection Time: 02/09/23 10:27 AM  Result Value Ref Range   Vit D, 25-Hydroxy 14.6 (L) 30.0 - 100.0 ng/mL    Comment: Vitamin D deficiency has been defined by the [place] practice guideline as a level of serum 25-OH vitamin D less than 20 ng/mL (1,2). The Endocrine Society went on to further define vitamin D insufficiency as a level between 21 and 29 ng/mL (2). 1. IOM (Institute of Medicine). 2010. Dietary reference    intakes for calcium and D. [place]: The    [employer]. 2. Holick MF, Binkley Hunter, Bischoff-Ferrari  HA, et al.    Evaluation, treatment, and prevention of vitamin D    deficiency: an Endocrine Society clinical practice    guideline. JCEM. 2011 Jul; 96(7):1911-30.   CBC With Differential     Status: None   Collection Time: 02/09/23 10:27 AM  Result Value Ref Range   WBC 4.0 3.4 - 10.8 x10E3/uL   RBC 4.49 3.77 - 5.28 x10E6/uL   Hemoglobin 13.2 11.1 - 15.9 g/dL   Hematocrit 39.5 34.0 - 46.6 %   MCV 88 79 - 97 fL   MCH 29.4 26.6 - 33.0 pg   MCHC 33.4 31.5 - 35.7 g/dL   RDW 12.6 11.7 - 15.4 %   Neutrophils 48 Not Estab. %   Lymphs 42 Not Estab. %   Monocytes  7 Not Estab. %   Eos 1 Not Estab. %   Basos 2 Not Estab. %   Neutrophils Absolute 2.0 1.4 - 7.0 x10E3/uL   Lymphocytes Absolute 1.7 0.7 - 3.1 x10E3/uL   Monocytes Absolute 0.3 0.1 - 0.9 x10E3/uL   EOS (ABSOLUTE) 0.0 0.0 - 0.4 x10E3/uL   Basophils Absolute 0.1 0.0 - 0.2 x10E3/uL   Immature Granulocytes 0 Not Estab. %   Immature Grans (Abs) 0.0 0.0 - 0.1 x10E3/uL  CMP14+EGFR     Status: Abnormal   Collection Time: 02/09/23 10:27 AM  Result Value Ref Range   Glucose 120 (H) 70 - 99 mg/dL   BUN 10 8 - 27 mg/dL   Creatinine, Ser 1.08 (H) 0.57 - 1.00 mg/dL   eGFR 58 (L) >59 mL/min/1.73   BUN/Creatinine Ratio 9 (L) 12 - 28   Sodium 142 134 - 144 mmol/L   Potassium 3.9 3.5 - 5.2 mmol/L   Chloride 100 96 - 106 mmol/L   CO2 28 20 - 29 mmol/L   Calcium 9.7 8.7 - 10.3 mg/dL   Total Protein 6.6 6.0 - 8.5 g/dL   Albumin 4.4 3.9 - 4.9 g/dL   Globulin, Total 2.2 1.5 - 4.5 g/dL   Albumin/Globulin Ratio 2.0 1.2 - 2.2   Bilirubin Total 0.4 0.0 - 1.2 mg/dL   Alkaline Phosphatase 91 44 - 121 IU/L   AST 13 0 - 40 IU/L   ALT 5 0 - 32 IU/L  TSH     Status: None   Collection Time: 02/09/23 10:27 AM  Result Value Ref Range   TSH 1.430 0.450 - 4.500 uIU/mL  Hemoglobin A1c     Status: Abnormal   Collection Time: 02/09/23 10:27 AM  Result Value Ref Range   Hgb A1c MFr Bld 6.3 (H) 4.8 - 5.6 %    Comment:          Prediabetes: 5.7 - 6.4           Diabetes: >6.4          Glycemic control for adults with diabetes: <7.0    Est. average glucose Bld gHb Est-mCnc 134 mg/dL  Vitamin B12     Status: None   Collection Time: 02/09/23 10:27 AM  Result Value Ref Range   Vitamin B-12 350 232 - 1,245 pg/mL  CBC with Differential     Status: None   Collection Time: 02/18/23 11:51 PM  Result Value Ref Range   WBC 5.8 4.0 - 10.5 K/uL   RBC 4.39 3.87 - 5.11 MIL/uL   Hemoglobin 12.5 12.0 - 15.0 g/dL   HCT 37.3 36.0 - 46.0 %   MCV 85.0 80.0 - 100.0 fL   MCH 28.5 26.0 - 34.0 pg   MCHC 33.5 30.0 - 36.0 g/dL   RDW 12.6 11.5 - 15.5 %   Platelets 298 150 - 400 K/uL   nRBC 0.0 0.0 - 0.2 %   Neutrophils Relative % 46 %   Neutro Abs 2.8 1.7 - 7.7 K/uL   Lymphocytes Relative 44 %   Lymphs Abs 2.5 0.7 - 4.0 K/uL   Monocytes Relative 8 %   Monocytes Absolute 0.4 0.1 - 1.0 K/uL   Eosinophils Relative 1 %   Eosinophils Absolute 0.1 0.0 - 0.5 K/uL   Basophils Relative 1 %   Basophils Absolute 0.1 0.0 - 0.1 K/uL   Immature Granulocytes 0 %   Abs Immature Granulocytes 0.01 0.00 - 0.07 K/uL    Comment: Performed at [facility], [address] [place], [place] [address]  Comprehensive metabolic panel     Status: Abnormal   Collection Time: 02/18/23 11:51 PM  Result Value Ref Range   Sodium 133 (L) 135 - 145 mmol/L   Potassium 2.7 (LL) 3.5 - 5.1 mmol/L    Comment: CRITICAL RESULT CALLED TO, READ BACK BY AND VERIFIED WITH JOSH EARHART AT 0058 02/19/2023 [facility]    Chloride 96 (L) 98 - 111 mmol/L   CO2 27 22 - 32 mmol/L   Glucose, Bld 151 (H) 70 - 99 mg/dL    Comment: Glucose reference range applies only to samples taken after fasting for at least 8 hours.   BUN 12 8 - 23 mg/dL   Creatinine, Ser 1.10 (H) 0.44 - 1.00 mg/dL   Calcium 9.2 8.9 - 10.3 mg/dL   Total Protein 7.4 6.5 - 8.1 g/dL   Albumin 4.1 3.5 - 5.0 g/dL   AST 16 15 - 41 U/L   ALT 9 0 - 44 U/L   Alkaline Phosphatase 77 38 - 126 U/L   Total Bilirubin 0.8 0.3 - 1.2 mg/dL    GFR, Estimated 57 (L) >60 mL/min    Comment: (NOTE) Calculated using the CKD-EPI Creatinine Equation (2021)    Anion gap 10 5 - 15    Comment: Performed at [facility], [place], [place] [address]  Troponin I (High Sensitivity)     Status: Abnormal   Collection Time: 02/18/23 11:51 PM  Result Value Ref Range   Troponin I (High Sensitivity) 18 (H) <18 ng/L    Comment: (NOTE) Elevated high sensitivity troponin I (hsTnI) values and significant  changes across serial measurements may suggest ACS but many other  chronic and acute conditions are known to elevate hsTnI results.  Refer to the "Links" section for chest pain algorithms and additional  guidance. Performed at [facility], [address] [place], [place] [address]   Lipase, blood     Status: None   Collection Time: 02/18/23 11:51 PM  Result Value Ref Range   Lipase 47 11 - 51 U/L    Comment: Performed at [facility], [address] [place], [place] [address]  Brain natriuretic peptide     Status: None   Collection Time: 02/18/23 11:54 PM  Result Value Ref Range   B Natriuretic Peptide 16.0 0.0 - 100.0 pg/mL    Comment: Performed at [facility], [address] [place], [place] [address]  Magnesium     Status: None   Collection Time: 02/18/23 11:54 PM  Result Value Ref Range   Magnesium 1.7 1.7 - 2.4 mg/dL    Comment: Performed at [facility], [address] [place], [place] [address]  Resp panel by RT-PCR (RSV, Flu A&B, Covid) Anterior Nasal Swab     Status: None   Collection Time: 02/19/23 12:49 AM   Specimen: Anterior Nasal Swab  Result Value Ref Range   SARS Coronavirus 2 by RT PCR NEGATIVE NEGATIVE    Comment: (NOTE) SARS-CoV-2 target nucleic acids are NOT DETECTED.  The SARS-CoV-2 RNA is generally detectable in upper respiratory specimens during the acute phase of infection. The lowest concentration of SARS-CoV-2 viral copies this assay  can detect is 138 copies/mL. A negative result does not preclude SARS-Cov-2 infection and should not be used as the sole basis for treatment or other patient management decisions. A negative result may occur with  improper specimen collection/handling, submission of specimen other  than nasopharyngeal swab, presence of viral mutation(s) within the areas targeted by this assay, and inadequate number of viral copies(<138 copies/mL). A negative result must be combined with clinical observations, patient history, and epidemiological information. The expected result is Negative.  Fact Sheet for Patients:  EntrepreneurPulse.com.au  Fact Sheet for Healthcare Providers:  IncredibleEmployment.be  This test is no t yet approved or cleared by the [place] FDA and  has been authorized for detection and/or diagnosis of SARS-CoV-2 by FDA under an Emergency Use Authorization (EUA). This EUA will remain  in effect (meaning this test can be used) for the duration of the COVID-19 declaration under Section 564(b)(1) of the Act, 21 U.S.C.section 360bbb-3(b)(1), unless the authorization is terminated  or revoked sooner.       Influenza A by PCR NEGATIVE NEGATIVE   Influenza B by PCR NEGATIVE NEGATIVE    Comment: (NOTE) The Xpert Xpress SARS-CoV-2/FLU/RSV plus assay is intended as an aid in the diagnosis of influenza from Nasopharyngeal swab specimens and should not be used as a sole basis for treatment. Nasal washings and aspirates are unacceptable for Xpert Xpress SARS-CoV-2/FLU/RSV testing.  Fact Sheet for Patients: EntrepreneurPulse.com.au  Fact Sheet for Healthcare Providers: IncredibleEmployment.be  This test is not yet approved or cleared by the [place] FDA and has been authorized for detection and/or diagnosis of SARS-CoV-2 by FDA under an Emergency Use Authorization (EUA). This EUA will remain in effect  (meaning this test can be used) for the duration of the COVID-19 declaration under Section 564(b)(1) of the Act, 21 U.S.C. section 360bbb-3(b)(1), unless the authorization is terminated or revoked.     Resp Syncytial Virus by PCR NEGATIVE NEGATIVE    Comment: (NOTE) Fact Sheet for Patients: EntrepreneurPulse.com.au  Fact Sheet for Healthcare Providers: IncredibleEmployment.be  This test is not yet approved or cleared by the [place] FDA and has been authorized for detection and/or diagnosis of SARS-CoV-2 by FDA under an Emergency Use Authorization (EUA). This EUA will remain in effect (meaning this test can be used) for the duration of the COVID-19 declaration under Section 564(b)(1) of the Act, 21 U.S.C. section 360bbb-3(b)(1), unless the authorization is terminated or revoked.  Performed at [facility], [place], [place] [address]   Troponin I (High Sensitivity)     Status: None   Collection Time: 02/19/23  3:22 AM  Result Value Ref Range   Troponin I (High Sensitivity) 16 <18 ng/L    Comment: (NOTE) Elevated high sensitivity troponin I (hsTnI) values and significant  changes across serial measurements may suggest ACS but many other  chronic and acute conditions are known to elevate hsTnI results.  Refer to the "Links" section for chest pain algorithms and additional  guidance. Performed at [facility], [place], [place] [address]   POCT CBG (Fasting - Glucose)     Status: Normal   Collection Time: 02/20/23  2:07 PM  Result Value Ref Range   Glucose Fasting, POC 98 70 - 99 mg/dL      Assessment & Plan:   Problem List Items Addressed This Visit     Type 2 diabetes mellitus with hyperglycemia (HCC) - Primary   Relevant Orders   POCT CBG (Fasting - Glucose) (Completed)    No follow-ups on file.   Total time spent: 20 minutes  Mechele Claude, FNP  02/20/2023

## 2023-02-20 NOTE — Progress Notes (Signed)
Cardiology Office Note   Date:  02/20/2023   ID:  Kayla Malone, DOB 1961/11/03, MRN [id number]  PCP:  Mechele Claude, FNP  Cardiologist:  [name], MD      History of Present Illness: Kayla Malone is a 62 y.o. female who presents for  Chief Complaint  Patient presents with   Acute Visit    Chest pains ED follow up    Went to ER 02/18/23,  with chest pain and K was 2.7 and was replaced but not checked since then  Chest Pain  This is a new problem. The current episode started yesterday. The onset quality is gradual. The problem occurs 2 to 4 times per day. The problem has been rapidly worsening. The pain is present in the substernal region. The pain is at a severity of 3/10. The quality of the pain is described as tightness. Associated symptoms include dizziness, exertional chest pressure, malaise/fatigue, nausea and numbness.      Past Medical History:  Diagnosis Date   Adenomatous colon polyp 02/20/2014   Asthma    Benign neoplasm of colon 02/20/2014   Chest pain 09/13/2013   Coronary angioplasty status 11/13/2018   Coronary artery disease    Depression    Diabetes mellitus without complication (HCC)    GERD (gastroesophageal reflux disease)    History of hiatal hernia    Hypertension    Hypokalemia 05/29/2019   Hypomagnesemia 07/02/2022   Nausea with vomiting, unspecified 06/07/2019   NSTEMI (non-ST elevated myocardial infarction) ([place]) 09/23/2018   Pain    BACK   Stroke ([place])    x3   Thyroid cyst    TIA (transient ischemic attack) 08/06/2017   Unstable angina ([place]) 10/20/2017   Weight loss 06/07/2019     Past Surgical History:  Procedure Laterality Date   ABDOMINAL HYSTERECTOMY     CARDIAC SURGERY     CATARACT EXTRACTION W/PHACO Left 08/15/2017   Procedure: CATARACT EXTRACTION PHACO AND INTRAOCULAR LENS PLACEMENT ([place]);  Surgeon: Birder Robson, MD;  Location: [facility];  Service: Ophthalmology;  Laterality: Left;  [place] 00:40.6 AP%  7.3 CDE 2.98 FLUID PACK LOT # [id number]   CORONARY ARTERY BYPASS GRAFT N/A 10/23/2017   Procedure: CORONARY ARTERY BYPASS GRAFTING (CABG) x two  , using left internal mammary artery and right leg greater saphenous vein harvested  endoscopically - LIMA to LAD, SVG to RCA;  Surgeon: Grace Isaac, MD;  Location: [facility];  Service: Open Heart Surgery;  Laterality: N/A;   CORONARY STENT INTERVENTION N/A 09/24/2018   Procedure: CORONARY STENT INTERVENTION;  Surgeon: Jettie Booze, MD;  Location: [facility];  Service: Cardiovascular;  Laterality: N/A;   ESOPHAGOGASTRODUODENOSCOPY N/A 05/30/2019   Procedure: ESOPHAGOGASTRODUODENOSCOPY (EGD);  Surgeon: Toledo, Benay Pike, MD;  Location: [facility];  Service: Gastroenterology;  Laterality: N/A;   EYE SURGERY     LEFT HEART CATH AND CORONARY ANGIOGRAPHY Left 10/09/2017   Procedure: LEFT HEART CATH AND CORONARY ANGIOGRAPHY;  Surgeon: Dionisio [name], MD;  Location: [facility];  Service: Cardiovascular;  Laterality: Left;   LEFT HEART CATH AND CORS/GRAFTS ANGIOGRAPHY N/A 09/24/2018   Procedure: LEFT HEART CATH AND CORS/GRAFTS ANGIOGRAPHY;  Surgeon: Jettie Booze, MD;  Location: [facility];  Service: Cardiovascular;  Laterality: N/A;   LEFT HEART CATH AND CORS/GRAFTS ANGIOGRAPHY N/A 07/04/2022   Procedure: LEFT HEART CATH AND CORS/GRAFTS ANGIOGRAPHY;  Surgeon: Dionisio [name], MD;  Location: [facility];  Service: Cardiovascular;  Laterality: N/A;   TEE WITHOUT CARDIOVERSION N/A 10/23/2017   Procedure: TRANSESOPHAGEAL ECHOCARDIOGRAM (TEE);  Surgeon: Grace Isaac, MD;  Location: [facility];  Service: Open Heart Surgery;  Laterality: N/A;     Current Outpatient Medications  Medication Sig Dispense Refill   Accu-Chek Softclix Lancets lancets      acetaminophen (TYLENOL) 500 MG tablet Take 1,000 mg by mouth every 6 (six) hours as needed for mild pain or headache.     aspirin EC 81 MG tablet Take 81 mg by  mouth daily.     atorvastatin (LIPITOR) 80 MG tablet TAKE 1 TABLET EVERY DAY 90 tablet 3   Blood Glucose Monitoring Suppl (ACCU-CHEK GUIDE ME) w/Device KIT      chlorthalidone (HYGROTON) 25 MG tablet Chlorthalidone 25 MG Oral Tablet QTY: 90 tablet Days: 90 Refills: 2  Written: 04/09/20 Patient Instructions: once a day     clopidogrel (PLAVIX) 75 MG tablet Take by mouth.     fluticasone (FLONASE) 50 MCG/ACT nasal spray Place 2 sprays into both nostrils daily.     glucose blood (ACCU-CHEK GUIDE) test strip Blood Glucose Test In Vitro Strip QTY: 100 strip Days: 90 Refills: 3  Written: 06/07/19 Patient Instructions: E11.9 - to check blodd sugars once daily     isosorbide mononitrate (IMDUR) 30 MG 24 hr tablet Take 30 mg by mouth daily.     losartan (COZAAR) 100 MG tablet Take 100 mg by mouth daily.  2   methocarbamol (ROBAXIN) 500 MG tablet Take 500 mg by mouth at bedtime as needed for muscle spasms.     metoprolol succinate (TOPROL-XL) 50 MG 24 hr tablet Take 50 mg by mouth daily. Take with or immediately following a meal.     nitroGLYCERIN (NITROSTAT) 0.4 MG SL tablet Place 1 tablet (0.4 mg total) under the tongue every 5 (five) minutes x 3 doses as needed for chest pain. 25 tablet 0   ondansetron (ZOFRAN-ODT) 4 MG disintegrating tablet Take 4 mg by mouth every 8 (eight) hours as needed for nausea or vomiting.     pantoprazole (PROTONIX) 40 MG tablet Take 40 mg by mouth.      potassium chloride SA (KLOR-CON M) 20 MEQ tablet Take 20 mEq by mouth daily.     Vitamin D, Ergocalciferol, (DRISDOL) 1.25 MG (50000 UNIT) CAPS capsule Take 1 capsule (50,000 Units total) by mouth every 7 (seven) days. 12 capsule 1   indomethacin (INDOCIN) 25 MG capsule Take 25 mg by mouth 3 (three) times daily as needed for mild pain or moderate pain. (Patient not taking: Reported on 02/20/2023)     nitroGLYCERIN (NITROSTAT) 0.6 MG SL tablet USE 1 TAB UNDER TONGUE AS NEEDED FOR CHEST PAIN. REPEAT IN 5 MIN IF NO RELIEF. IF MORE  THAN 2 TABS NEEDED, CALL 911 (Patient not taking: Reported on 02/20/2023) 100 tablet 3   ranolazine (RANEXA) 500 MG 12 hr tablet TAKE 1 TABLET TWICE DAILY (Patient not taking: Reported on 02/20/2023) 180 tablet 1   RYBELSUS 3 MG TABS Take 3 mg by mouth daily. (Patient not taking: Reported on 02/20/2023)     No current facility-administered medications for this visit.    Allergies:   Lisinopril, Meloxicam, Tramadol, Amoxicillin, Amoxicillin-pot clavulanate, and Clavulanic acid    Social History:   reports that she quit smoking about 24 years ago. Her smoking use included cigarettes. She has never used smokeless tobacco. She reports that she does not drink alcohol and does not use drugs.  Family History:  family history includes COPD in her brother; Cirrhosis in her father; Coronary artery disease in her brother, father, and sister; Diabetes type II in her father; Heart failure in her mother; Hypertension in her father; Kidney disease in her father; Pulmonary embolism in her mother; Stroke in her sister.    ROS:     Review of Systems  Constitutional:  Positive for malaise/fatigue.  HENT: Negative.    Eyes: Negative.   Respiratory: Negative.    Cardiovascular:  Positive for chest pain.  Gastrointestinal:  Positive for nausea.  Genitourinary: Negative.   Musculoskeletal: Negative.   Skin: Negative.   Neurological:  Positive for dizziness and numbness.  Endo/Heme/Allergies: Negative.   Psychiatric/Behavioral: Negative.    All other systems reviewed and are negative.     All other systems are reviewed and negative.    PHYSICAL EXAM: VS:  BP 120/70   Pulse 69   Ht '5\' 3"'$  (1.6 m)   Wt 130 lb 9.6 oz (59.2 kg)   SpO2 97%   BMI 23.13 kg/m  , BMI Body mass index is 23.13 kg/m. Last weight:  Wt Readings from Last 3 Encounters:  02/20/23 130 lb 9.6 oz (59.2 kg)  02/20/23 130 lb (59 kg)  02/18/23 130 lb (59 kg)     Physical Exam Constitutional:      Appearance: Normal appearance.   Cardiovascular:     Rate and Rhythm: Normal rate and regular rhythm.     Heart sounds: Normal heart sounds.  Pulmonary:     Effort: Pulmonary effort is normal.     Breath sounds: Normal breath sounds.  Musculoskeletal:     Right lower leg: No edema.     Left lower leg: No edema.  Neurological:     Mental Status: She is alert.       EKG: NSR 60/min no acute changes on 02/18/23 at [facility]: 02/09/2023: TSH 1.430 02/18/2023: ALT 9; B Natriuretic Peptide 16.0; BUN 12; Creatinine, Ser 1.10; Hemoglobin 12.5; Magnesium 1.7; Platelets 298; Potassium 2.7; Sodium 133    Lipid Panel    Component Value Date/Time   CHOL 154 02/09/2023 1027   TRIG 81 02/09/2023 1027   HDL 52 02/09/2023 1027   CHOLHDL 3.0 02/09/2023 1027   CHOLHDL 4.0 07/04/2022 0623   VLDL 14 07/04/2022 0623   LDLCALC 87 02/09/2023 1027      REASON FOR VISIT  Visit for: Echocardiogram/ R 07.9  Sex:   Female   wt=127    lbs.  BP=126/60  Height= 63   inches.        TESTS  Imaging: Echocardiogram:  An echocardiogram in (2-d) mode was performed and in Doppler mode with color flow velocity mapping was performed. The aortic valve cusps are abnormal 0.9   cm, flow velocity 1.17   m/s, and systolic calculated mean flow gradient 4  mmHg. Mitral valve diastolic peak flow velocity E .721   m/s and E/A ratio 2.4. Aortic root diameter 2.9  cm. The LVOT internal diameter 2.5 cm and flow velocity was abnormal .707   m/s. LV systolic dimension [id number]   cm, diastolic 0000000   cm, posterior wall thickness 1.2   cm, fractional shortening 47.2  %, and EF 79.4  %. IVS thickness 1.04   cm. LA dimension 4.0 cm. Mitral Valve has Mild Regurgitation. Pulmonic Valve has Trace Regurgitation. Tricuspid Valve has Trace Regurgitation.     ASSESSMENT  Technically adequate study.  Normal chamber sizes.  Normal left ventricular systolic function.  Normal right ventricular systolic function.  Normal right ventricular diastolic  function.  Normal left ventricular wall motion.  Normal right ventricular wall motion.  Trace pulmonary regurgitation.  Trace tricuspid regurgitation.  Borderline pulmonary hypertension.  Mild mitral regurgitation.  No pericardial effusion.  Moderate LVH.     THERAPY   Referring physician: Dionisio [name]  Sonographer: [name].      [name] MD  Electronically signed by: [name]     Date: 06/22/2022 09:30 TESTS                                                                                          [facility] [address] [place], [place] [address] [phone] STUDY:  Gated Stress / Rest Myocardial Perfusion Imaging Tomographic (SPECT) Including attenuation correction Wall Motion, Left Ventricular Ejection Fraction By Gated Technique.Treadmill Stress Test. SEX: Female   WEIGHT: 120 lbs   HEIGHT: 63 in          ARMS UP: YES/NO                                                                        REFERRING PHYSICIAN: Dr.Shar Paez Humphrey Rolls                                                                                                                                                                                                                       INDICATION FOR STUDY: CP  TECHNIQUE:  Approximately 20 minutes following the intravenous administration of 9.7 mCi of Tc-21mSestamibi after stress testing in a reclined supine position with arms above their head if able to do so, gated SPECT imaging of the heart was performed. After about a 2hr break, the patient was injected intravenously with 30.5 mCi of Tc-962mestamibi.  Approximately 45 minutes later in the same position as stress imaging SPECT rest imaging of the heart was performed.  STRESS BY:   ShNeoma LamingMD PROTOCOL: Modified Bruce                                                                                         MAX PRED HR: 160                     85%: 136               75%: 120                                                                                                                   RESTING BP: 128/70   RESTING HR: 74  PEAK BP: 168/88   PEAK HR: 134 (83%)                                                                    EXERCISE DURATION: 5:00                                             METS:  4.2    REASON FOR TEST TERMINATION: Fatigue. Leg pain.  SYMPTOMS: Fatigue. Leg pain. [facility] TREADMILL SCORE: -5                                       RISK:  Moderate                                                                                                                                                                                                           EKG RESULTS:  NSR. 69/min. 75m horizontal ST depression in inferolateral leads.                                                            IMAGE QUALITY:  Fair  PERFUSION/WALL MOTION FINDINGS: EF = 80%. Reverse redistribution, no perfusion defects, normal wall motion.                                                                          IMPRESSION: No evidence of ischemia with normal LVEF, but some ST depression at peak exercise.                                                                                                                                                                                                                                                                                         [name], MD Stress Interpreting Physician / Nuclear Interpreting Physician                  [name] MD  Electronically signed by: [name]     Date: 07/02/2021 14:48 Other studies Reviewed: Additional studies/ records that were reviewed today include:  Review of the above records demonstrates:       No data to display           [facility] [address] [place], [place] [address] [phone] STUDY:  Gated Stress / Rest Myocardial Perfusion Imaging Tomographic (SPECT) Including attenuation correction Wall Motion, Left Ventricular Ejection Fraction By Gated Technique.Persantine Stress Test. SEX: Female  WEIGHT: 118 lbs  HEIGHT: 63 in  ARMS UP: YES/NO  REFERRING PHYSICIAN: Dr.Xiamara Hulet Humphrey Rolls                                                                                                                                                                                                                       INDICATION FOR STUDY:  CP                                                                                                                                                                                                                   TECHNIQUE:  Approximately 20 minutes following the intravenous administration of 9.7 mCi of Tc-61mSestamibi after stress testing in a reclined supine position with arms above their head if able to do so, gated SPECT imaging of the heart was performed. After about a 2hr break, the patient was injected intravenously with 31.8 mCi of Tc-922mestamibi.  Approximately 45 minutes later in the same position as stress imaging SPECT rest  imaging of the heart was performed.  STRESS BY:  ShNeoma LamingMD PROTOCOL:  Persantine   DOSE ADMIN: 6.0 CC   ROUTE OF ADMINISTRATION: IV  MAX PRED HR: 159                     85%:  135              75%: 119                                                                                                                   RESTING BP: 120/74  RESTING HR: 69  PEAK BP: 132/66  PEAK HR: 72                                                                    EXERCISE DURATION:    4 min injection                                            REASON FOR TEST TERMINATION:    Protocol end                                                                                                                              SYMPTOMS:   None  EKG RESULTS: Baseline. NSR. 70/min. No significant ST changes with persantine.                                                              IMAGE QUALITY: Fair                                                                                                                                                                                                                                                                                                                                  PERFUSION/WALL MOTION FINDINGS: EF = 73%. Diffuse ischemia throughout all 3 vessels, normal wall motion.                                                                           IMPRESSION:  Diffuse ischemia in all 3 vessels, normal LVEF, advise CCTA.  [name], MD Stress Interpreting Physician / Nuclear Interpreting Physician               REASON FOR VISIT  Referred by Dr.Wendell Nicoson Humphrey Rolls.        TESTS  Imaging: Computed Tomographic Angiography:  Cardiac multidetector CT was performed paying particular attention to the coronary arteries for the diagnosis of: Chest Pain. Diagnostic Drugs:  Administered iohexol (Omnipaque) through an antecubital vein and images from the examination were analyzed for the presence and extent of coronary artery disease, using 3D image processing software. 100 mL of non-ionic contrast (Omnipaque) was used.        ASSESSMENT   The proximal right coronary artery (RCA) was abnormal:2  The mid right coronary artery (RCA) was abnormal:1  The distal right coronary artery (RCA) was abnormal:1, Stent  The posterior descending coronary arteries were abnormal:1  The LIMA origin was visualized Patent     TEST CONCLUSIONS  Quality of study: Good.  1-Calcium score: 1436.8  2-Right dominanat system.  3-LIMA to LAD patent. Stent in mid RCA has no significant restenosis. Treat medically.      [name] MD  Electronically signed by: [name]     Date: 01/20/2020 14:28  [name] MD  Electronically signed by: [name]     Date: 06/20/2022 09:25  ASSESSMENT AND PLAN:    ICD-10-CM   1. Other chest pain  R07.89 MYOCARDIAL PERFUSION IMAGING    PCV ECHOCARDIOGRAM COMPLETE    Basic metabolic panel   will repeat stress test echo    2. Atherosclerosis of native coronary artery of transplanted heart without angina pectoris  I25.811 MYOCARDIAL PERFUSION IMAGING    PCV ECHOCARDIOGRAM COMPLETE    Basic metabolic panel    3. Benign essential hypertension  I10 MYOCARDIAL PERFUSION IMAGING    PCV ECHOCARDIOGRAM COMPLETE    Basic metabolic panel    4. Coronary graft stenosis, subsequent encounter  T82.857D MYOCARDIAL  PERFUSION IMAGING    PCV ECHOCARDIOGRAM COMPLETE    Basic metabolic panel    5. Peripheral vascular disease (HCC)  I73.9 MYOCARDIAL PERFUSION IMAGING    PCV ECHOCARDIOGRAM COMPLETE    Basic metabolic panel    6. Type 2 diabetes mellitus with hyperglycemia, without long-term current use of insulin (HCC)  E11.65 MYOCARDIAL PERFUSION IMAGING    PCV ECHOCARDIOGRAM COMPLETE    Basic metabolic panel    7. CVA, old, hemiparesis ([place])  I69.359 MYOCARDIAL PERFUSION IMAGING    PCV ECHOCARDIOGRAM COMPLETE    Basic metabolic panel    8. Hypokalemia  E87.6 MYOCARDIAL PERFUSION IMAGING    PCV ECHOCARDIOGRAM COMPLETE    Basic metabolic panel   K was 2.7 but was  replaced in ER with omprovement symptoms, advise labs checked       Problem List Items Addressed This Visit       Cardiovascular and Mediastinum   Benign essential hypertension   Relevant Orders   MYOCARDIAL PERFUSION IMAGING   PCV ECHOCARDIOGRAM COMPLETE   Basic metabolic panel   Atherosclerotic heart disease of native coronary artery without angina pectoris   Relevant Orders   MYOCARDIAL PERFUSION IMAGING   PCV ECHOCARDIOGRAM COMPLETE   Basic metabolic panel   Coronary graft stenosis   Relevant Orders   MYOCARDIAL PERFUSION IMAGING   PCV ECHOCARDIOGRAM COMPLETE   Basic metabolic panel   Peripheral vascular disease (HCC)   Relevant Orders   MYOCARDIAL PERFUSION IMAGING   PCV ECHOCARDIOGRAM COMPLETE   Basic metabolic panel  Endocrine   Type 2 diabetes mellitus with hyperglycemia (HCC)   Relevant Orders   MYOCARDIAL PERFUSION IMAGING   PCV ECHOCARDIOGRAM COMPLETE   Basic metabolic panel     Nervous and Auditory   CVA, old, hemiparesis ([place])   Relevant Orders   MYOCARDIAL PERFUSION IMAGING   PCV ECHOCARDIOGRAM COMPLETE   Basic metabolic panel   Other Visit Diagnoses     Other chest pain    -  Primary   will repeat stress test echo   Relevant Orders   MYOCARDIAL PERFUSION IMAGING   PCV ECHOCARDIOGRAM  COMPLETE   Basic metabolic panel   Hypokalemia       K was 2.7 but was  replaced in ER with omprovement symptoms, advise labs checked   Relevant Orders   MYOCARDIAL PERFUSION IMAGING   PCV ECHOCARDIOGRAM COMPLETE   Basic metabolic panel          Disposition:   No follow-ups on file.    Total time spent: 40 minutes  Signed,  [name], MD  02/20/2023 3:00 PM    [facility]

## 2023-02-21 LAB — BASIC METABOLIC PANEL
BUN/Creatinine Ratio: 12 (ref 12–28)
BUN: 14 mg/dL (ref 8–27)
CO2: 23 mmol/L (ref 20–29)
Calcium: 10.3 mg/dL (ref 8.7–10.3)
Chloride: 97 mmol/L (ref 96–106)
Creatinine, Ser: 1.18 mg/dL — ABNORMAL HIGH (ref 0.57–1.00)
Glucose: 97 mg/dL (ref 70–99)
Potassium: 3.8 mmol/L (ref 3.5–5.2)
Sodium: 137 mmol/L (ref 134–144)
eGFR: 52 mL/min/{1.73_m2} — ABNORMAL LOW (ref >59)

## 2023-02-21 NOTE — Addendum Note (Signed)
Addended by: Georgian Co on: 02/21/2023 09:19 AM   Modules accepted: Orders

## 2023-02-22 ENCOUNTER — Telehealth: Payer: Self-pay

## 2023-02-22 NOTE — Telephone Encounter (Signed)
ED Discharge  Kayla Malone,Kayla Malone 62 years, Female  DOB: 1961-09-16  M: [phone]  __________________________________________________ ED Details Attempt #2 Date:: 02/21/2023 Time: AM Outcome:: Successful Patient Details What led you to go to the Emergency Room?: Patient stated she felt weird , sweating a lot to the point her clothes were wet. Was it trauma event or true emergency? (Chest pain, loss of consciousness, stroke, motor vehicle accident, fracture, fall, confusion, etc.): No What were your symptoms?: Patient was not feeling well and stated she stated sweating so bad her clothes were dripping wet. Duration of symptoms?: Hours If you had symptoms, what things did you try to treat them?: Other Details: Patient changed clothes  Did you call your provider first (before going to the Emergency Room)?: No Why?: Patient stated she felt really bad so she felt like she needed medical care sooner then later. Are you aware that your doctor's office has afterhours / on call service?: No Did you consider any alternatives to the Emergency Room like an urgent care center?: No What outcome(s) were you expecting? (for example: Medications, Labs, Imaging, etc.): Patient stated to feel better and to stop sweating . Was this outcome(s) met?: Yes Was it recommended that you follow up with your health care provider or another provider?: Yes Do you already have a follow up with your Provider or specialist?: Yes Do you feel comfortable and do you understand the instructions you received from the Emergency Room on warning signs or symptoms that would indicate a need to call the doctor(s), or be seen before your next appointment?: Yes What would you do if you had any of the warning signs we just discussed and unable to reach your PCP?: Call 911 Med review completed - Med list from Emergency Room discharge reviewed with patient and changes updated in clinic EMR: Yes Were any of the following done  while you were in the Emergency Room?: X-ray, Labs Do you have someone at home to help you since you've been discharged from the hospital/ER/Urgent Care?: Yes Who/Relationship: Family  Do you have any barriers to receiving the healthcare recommended for you?: Other Details: None.  Are there any questions or concerns you have regarding your Emergency Room visit?: Patient started potassium 20 MEQ daily Engagement Notes Charlann Lange on 02/21/2023 11:06 AM HC Chart Review: HC Assessment call time spent: 15 min 02/21/23   Clinical Lead Review Clinical Lead Follow Up ED discharge protocol reviewed and: Clinical Lead Follow-up indicated. Clinical Lead Follow Up Plan: scheduled in august,2024 [name], pharmD  Reviewed : 85mns

## 2023-02-23 ENCOUNTER — Ambulatory Visit: Payer: Medicare HMO | Admitting: Cardiovascular Disease

## 2023-02-27 ENCOUNTER — Ambulatory Visit (INDEPENDENT_AMBULATORY_CARE_PROVIDER_SITE_OTHER): Payer: Medicare HMO

## 2023-02-27 DIAGNOSIS — I69359 Hemiplegia and hemiparesis following cerebral infarction affecting unspecified side: Secondary | ICD-10-CM

## 2023-02-27 DIAGNOSIS — R0789 Other chest pain: Secondary | ICD-10-CM | POA: Diagnosis not present

## 2023-02-27 DIAGNOSIS — I25811 Atherosclerosis of native coronary artery of transplanted heart without angina pectoris: Secondary | ICD-10-CM

## 2023-02-27 DIAGNOSIS — I739 Peripheral vascular disease, unspecified: Secondary | ICD-10-CM

## 2023-02-27 DIAGNOSIS — T82857D Stenosis of cardiac prosthetic devices, implants and grafts, subsequent encounter: Secondary | ICD-10-CM

## 2023-02-27 DIAGNOSIS — I10 Essential (primary) hypertension: Secondary | ICD-10-CM

## 2023-02-27 DIAGNOSIS — E1165 Type 2 diabetes mellitus with hyperglycemia: Secondary | ICD-10-CM

## 2023-02-27 DIAGNOSIS — E876 Hypokalemia: Secondary | ICD-10-CM

## 2023-02-27 MED ORDER — TECHNETIUM TC 99M SESTAMIBI GENERIC - CARDIOLITE
33.3000 | Freq: Once | INTRAVENOUS | Status: AC | PRN
  Administered 2023-02-27: 33.3 via INTRAVENOUS

## 2023-02-27 MED ORDER — TECHNETIUM TC 99M SESTAMIBI GENERIC - CARDIOLITE
10.3000 | Freq: Once | INTRAVENOUS | Status: AC | PRN
  Administered 2023-02-27: 10.3 via INTRAVENOUS

## 2023-02-28 ENCOUNTER — Telehealth: Payer: Self-pay

## 2023-02-28 NOTE — Telephone Encounter (Signed)
     Patient  visit on 02/19/2023  at [facility] was for chest pain.  Have you been able to follow up with your primary care physician? Yes  The patient was or was not able to obtain any needed medicine or equipment. No medication prescribed.  Are there diet recommendations that you are having difficulty following? No  Patient expresses understanding of discharge instructions and education provided has no other needs at this time. Yes   [facility] Care Guide   [email or web address]  ?? [phone]   Website: [email or web address]  [email or web address]

## 2023-03-02 ENCOUNTER — Ambulatory Visit (INDEPENDENT_AMBULATORY_CARE_PROVIDER_SITE_OTHER): Payer: Medicare HMO

## 2023-03-02 DIAGNOSIS — R0789 Other chest pain: Secondary | ICD-10-CM

## 2023-03-02 DIAGNOSIS — I361 Nonrheumatic tricuspid (valve) insufficiency: Secondary | ICD-10-CM

## 2023-03-02 DIAGNOSIS — E876 Hypokalemia: Secondary | ICD-10-CM

## 2023-03-02 DIAGNOSIS — I69359 Hemiplegia and hemiparesis following cerebral infarction affecting unspecified side: Secondary | ICD-10-CM

## 2023-03-02 DIAGNOSIS — I371 Nonrheumatic pulmonary valve insufficiency: Secondary | ICD-10-CM

## 2023-03-02 DIAGNOSIS — I25811 Atherosclerosis of native coronary artery of transplanted heart without angina pectoris: Secondary | ICD-10-CM

## 2023-03-02 DIAGNOSIS — I340 Nonrheumatic mitral (valve) insufficiency: Secondary | ICD-10-CM | POA: Diagnosis not present

## 2023-03-02 DIAGNOSIS — E1165 Type 2 diabetes mellitus with hyperglycemia: Secondary | ICD-10-CM

## 2023-03-02 DIAGNOSIS — I10 Essential (primary) hypertension: Secondary | ICD-10-CM

## 2023-03-02 DIAGNOSIS — I739 Peripheral vascular disease, unspecified: Secondary | ICD-10-CM

## 2023-03-02 DIAGNOSIS — T82857D Stenosis of cardiac prosthetic devices, implants and grafts, subsequent encounter: Secondary | ICD-10-CM

## 2023-03-03 ENCOUNTER — Ambulatory Visit: Payer: Medicare HMO | Admitting: Cardiovascular Disease

## 2023-03-06 ENCOUNTER — Ambulatory Visit (INDEPENDENT_AMBULATORY_CARE_PROVIDER_SITE_OTHER): Payer: Medicare HMO | Admitting: Cardiovascular Disease

## 2023-03-06 ENCOUNTER — Encounter: Payer: Self-pay | Admitting: Cardiovascular Disease

## 2023-03-06 VITALS — BP 105/60 | HR 64 | Ht 63.0 in | Wt 141.2 lb

## 2023-03-06 DIAGNOSIS — I10 Essential (primary) hypertension: Secondary | ICD-10-CM

## 2023-03-06 DIAGNOSIS — I739 Peripheral vascular disease, unspecified: Secondary | ICD-10-CM

## 2023-03-06 DIAGNOSIS — E782 Mixed hyperlipidemia: Secondary | ICD-10-CM | POA: Diagnosis not present

## 2023-03-06 DIAGNOSIS — I2510 Atherosclerotic heart disease of native coronary artery without angina pectoris: Secondary | ICD-10-CM | POA: Diagnosis not present

## 2023-03-06 NOTE — Assessment & Plan Note (Signed)
LDL 87 on 02/09/23. Work on diet and exercise. If LDL continues to be above 50, may need to add another lipid lowering agent.

## 2023-03-06 NOTE — Assessment & Plan Note (Signed)
Stress test normal. Denies chest pain, shortness of breath.

## 2023-03-06 NOTE — Assessment & Plan Note (Signed)
Well controlled. Continue same medications. EF normal on echocardiogram.

## 2023-03-06 NOTE — Progress Notes (Signed)
Cardiology Office Note   Date:  03/06/2023   ID:  Kayla Malone, DOB May 24, 1961, MRN [id number]  PCP:  Mechele Claude, FNP  Cardiologist:  [name], MD      History of Present Illness: Surenity Shaya Malone is a 62 y.o. female who presents for  Chief Complaint  Patient presents with   Follow-up    Echo/NST results    Patient in office to discuss results of echo and stress test. Denies chest pain, shortness of breath.     Past Medical History:  Diagnosis Date   Adenomatous colon polyp 02/20/2014   Asthma    Benign neoplasm of colon 02/20/2014   Chest pain 09/13/2013   Coronary angioplasty status 11/13/2018   Coronary artery disease    Depression    Diabetes mellitus without complication (HCC)    GERD (gastroesophageal reflux disease)    History of hiatal hernia    Hypertension    Hypokalemia 05/29/2019   Hypomagnesemia 07/02/2022   Nausea with vomiting, unspecified 06/07/2019   NSTEMI (non-ST elevated myocardial infarction) ([place]) 09/23/2018   Pain    BACK   Stroke ([place])    x3   Thyroid cyst    TIA (transient ischemic attack) 08/06/2017   Unstable angina ([place]) 10/20/2017   Weight loss 06/07/2019     Past Surgical History:  Procedure Laterality Date   ABDOMINAL HYSTERECTOMY     CARDIAC SURGERY     CATARACT EXTRACTION W/PHACO Left 08/15/2017   Procedure: CATARACT EXTRACTION PHACO AND INTRAOCULAR LENS PLACEMENT ([place]);  Surgeon: Birder Robson, MD;  Location: [facility];  Service: Ophthalmology;  Laterality: Left;  [place] 00:40.6 AP% 7.3 CDE 2.98 FLUID PACK LOT # [id number] h   CORONARY ARTERY BYPASS GRAFT N/A 10/23/2017   Procedure: CORONARY ARTERY BYPASS GRAFTING (CABG) x two  , using left internal mammary artery and right leg greater saphenous vein harvested  endoscopically - LIMA to LAD, SVG to RCA;  Surgeon: Grace Isaac, MD;  Location: [place];  Service: Open Heart Surgery;  Laterality: N/A;   CORONARY STENT INTERVENTION N/A 09/24/2018    Procedure: CORONARY STENT INTERVENTION;  Surgeon: Jettie Booze, MD;  Location: [place] [facility];  Service: Cardiovascular;  Laterality: N/A;   ESOPHAGOGASTRODUODENOSCOPY N/A 05/30/2019   Procedure: ESOPHAGOGASTRODUODENOSCOPY (EGD);  Surgeon: Toledo, Benay Pike, MD;  Location: [facility];  Service: Gastroenterology;  Laterality: N/A;   EYE SURGERY     LEFT HEART CATH AND CORONARY ANGIOGRAPHY Left 10/09/2017   Procedure: LEFT HEART CATH AND CORONARY ANGIOGRAPHY;  Surgeon: Dionisio [name], MD;  Location: [place] [facility];  Service: Cardiovascular;  Laterality: Left;   LEFT HEART CATH AND CORS/GRAFTS ANGIOGRAPHY N/A 09/24/2018   Procedure: LEFT HEART CATH AND CORS/GRAFTS ANGIOGRAPHY;  Surgeon: Jettie Booze, MD;  Location: [place] [facility];  Service: Cardiovascular;  Laterality: N/A;   LEFT HEART CATH AND CORS/GRAFTS ANGIOGRAPHY N/A 07/04/2022   Procedure: LEFT HEART CATH AND CORS/GRAFTS ANGIOGRAPHY;  Surgeon: Dionisio [name], MD;  Location: [place] [facility];  Service: Cardiovascular;  Laterality: N/A;   TEE WITHOUT CARDIOVERSION N/A 10/23/2017   Procedure: TRANSESOPHAGEAL ECHOCARDIOGRAM (TEE);  Surgeon: Grace Isaac, MD;  Location: [place];  Service: Open Heart Surgery;  Laterality: N/A;     Current Outpatient Medications  Medication Sig Dispense Refill   Accu-Chek Softclix Lancets lancets      acetaminophen (TYLENOL) 500 MG tablet Take 1,000 mg by mouth every 6 (six) hours as needed for mild pain  or headache.     aspirin EC 81 MG tablet Take 81 mg by mouth daily.     atorvastatin (LIPITOR) 80 MG tablet TAKE 1 TABLET EVERY DAY 90 tablet 3   Blood Glucose Monitoring Suppl (ACCU-CHEK GUIDE ME) w/Device KIT      chlorthalidone (HYGROTON) 25 MG tablet Chlorthalidone 25 MG Oral Tablet QTY: 90 tablet Days: 90 Refills: 2  Written: 04/09/20 Patient Instructions: once a day     clopidogrel (PLAVIX) 75 MG tablet Take by mouth.     fluticasone (FLONASE) 50 MCG/ACT nasal  spray Place 2 sprays into both nostrils daily.     glucose blood (ACCU-CHEK GUIDE) test strip Blood Glucose Test In Vitro Strip QTY: 100 strip Days: 90 Refills: 3  Written: 06/07/19 Patient Instructions: E11.9 - to check blodd sugars once daily     isosorbide mononitrate (IMDUR) 30 MG 24 hr tablet Take 30 mg by mouth daily.     losartan (COZAAR) 100 MG tablet Take 100 mg by mouth daily.  2   methocarbamol (ROBAXIN) 500 MG tablet Take 500 mg by mouth at bedtime as needed for muscle spasms.     metoprolol succinate (TOPROL-XL) 50 MG 24 hr tablet Take 50 mg by mouth daily. Take with or immediately following a meal.     nitroGLYCERIN (NITROSTAT) 0.4 MG SL tablet Place 1 tablet (0.4 mg total) under the tongue every 5 (five) minutes x 3 doses as needed for chest pain. 25 tablet 0   ondansetron (ZOFRAN-ODT) 4 MG disintegrating tablet Take 4 mg by mouth every 8 (eight) hours as needed for nausea or vomiting.     pantoprazole (PROTONIX) 40 MG tablet Take 40 mg by mouth.      potassium chloride SA (KLOR-CON M) 20 MEQ tablet Take 20 mEq by mouth daily.     Vitamin D, Ergocalciferol, (DRISDOL) 1.25 MG (50000 UNIT) CAPS capsule Take 1 capsule (50,000 Units total) by mouth every 7 (seven) days. 12 capsule 1   No current facility-administered medications for this visit.    Allergies:   Lisinopril, Meloxicam, Tramadol, Amoxicillin, Amoxicillin-pot clavulanate, and Clavulanic acid    Social History:   reports that she quit smoking about 24 years ago. Her smoking use included cigarettes. She has never used smokeless tobacco. She reports that she does not drink alcohol and does not use drugs.   Family History:  family history includes COPD in her brother; Cirrhosis in her father; Coronary artery disease in her brother, father, and sister; Diabetes type II in her father; Heart failure in her mother; Hypertension in her father; Kidney disease in her father; Pulmonary embolism in her mother; Stroke in her sister.     ROS:     Review of Systems  Constitutional: Negative.   HENT: Negative.    Eyes: Negative.   Respiratory: Negative.    Cardiovascular: Negative.   Gastrointestinal: Negative.   Genitourinary: Negative.   Musculoskeletal: Negative.   Skin: Negative.   Neurological: Negative.   Endo/Heme/Allergies: Negative.   Psychiatric/Behavioral: Negative.    All other systems reviewed and are negative.   All other systems are reviewed and negative.   PHYSICAL EXAM: VS:  BP 105/60   Pulse 64   Ht 5\' 3"  (1.6 m)   Wt 141 lb 3.2 oz (64 kg)   SpO2 100%   BMI 25.01 kg/m  , BMI Body mass index is 25.01 kg/m. Last weight:  Wt Readings from Last 3 Encounters:  03/06/23 141 lb 3.2 oz (64 kg)  02/20/23 130 lb 9.6 oz (59.2 kg)  02/20/23 130 lb (59 kg)    Physical Exam Constitutional:      Appearance: Normal appearance.  Cardiovascular:     Rate and Rhythm: Normal rate and regular rhythm.     Heart sounds: Normal heart sounds.  Pulmonary:     Effort: Pulmonary effort is normal.     Breath sounds: Normal breath sounds.  Musculoskeletal:     Right lower leg: No edema.     Left lower leg: No edema.  Neurological:     Mental Status: She is alert.     EKG: none today  Recent Labs: 02/09/2023: TSH 1.430 02/18/2023: ALT 9; B Natriuretic Peptide 16.0; Hemoglobin 12.5; Magnesium 1.7; Platelets 298 02/20/2023: BUN 14; Creatinine, Ser 1.18; Potassium 3.8; Sodium 137    Lipid Panel    Component Value Date/Time   CHOL 154 02/09/2023 1027   TRIG 81 02/09/2023 1027   HDL 52 02/09/2023 1027   CHOLHDL 3.0 02/09/2023 1027   CHOLHDL 4.0 07/04/2022 0623   VLDL 14 07/04/2022 0623   LDLCALC 87 02/09/2023 1027      Other studies Reviewed: stress test, echocardiogram   ASSESSMENT AND PLAN:    ICD-10-CM   1. Atherosclerosis of native coronary artery of native heart without angina pectoris  I25.10     2. Benign essential hypertension  I10     3. Peripheral vascular disease (HCC)  I73.9      4. Mixed hyperlipidemia  E78.2        Problem List Items Addressed This Visit       Cardiovascular and Mediastinum   Benign essential hypertension    Well controlled. Continue same medications. EF normal on echocardiogram.       Atherosclerotic heart disease of native coronary artery without angina pectoris - Primary    Stress test normal. Denies chest pain, shortness of breath.       Peripheral vascular disease ([place])     Other   Mixed hyperlipidemia    LDL 87 on 02/09/23. Work on diet and exercise. If LDL continues to be above 50, may need to add another lipid lowering agent.          Disposition:   Return in about 4 months (around 07/06/2023).    Total time spent: 30 minutes  Signed,  [name], MD  03/06/2023 1:28 [place]

## 2023-03-12 ENCOUNTER — Other Ambulatory Visit: Payer: Self-pay | Admitting: Internal Medicine

## 2023-03-12 DIAGNOSIS — E782 Mixed hyperlipidemia: Secondary | ICD-10-CM | POA: Diagnosis not present

## 2023-03-12 DIAGNOSIS — I10 Essential (primary) hypertension: Secondary | ICD-10-CM | POA: Diagnosis not present

## 2023-03-12 DIAGNOSIS — E1165 Type 2 diabetes mellitus with hyperglycemia: Secondary | ICD-10-CM | POA: Diagnosis not present

## 2023-03-16 DIAGNOSIS — Z01818 Encounter for other preprocedural examination: Secondary | ICD-10-CM | POA: Diagnosis not present

## 2023-03-21 DIAGNOSIS — M25551 Pain in right hip: Secondary | ICD-10-CM | POA: Diagnosis not present

## 2023-03-24 DIAGNOSIS — E785 Hyperlipidemia, unspecified: Secondary | ICD-10-CM | POA: Diagnosis not present

## 2023-03-24 DIAGNOSIS — E119 Type 2 diabetes mellitus without complications: Secondary | ICD-10-CM | POA: Diagnosis not present

## 2023-03-24 DIAGNOSIS — Z87891 Personal history of nicotine dependence: Secondary | ICD-10-CM | POA: Diagnosis not present

## 2023-03-24 DIAGNOSIS — J45909 Unspecified asthma, uncomplicated: Secondary | ICD-10-CM | POA: Diagnosis not present

## 2023-03-24 DIAGNOSIS — Z951 Presence of aortocoronary bypass graft: Secondary | ICD-10-CM | POA: Diagnosis not present

## 2023-03-24 DIAGNOSIS — I10 Essential (primary) hypertension: Secondary | ICD-10-CM | POA: Diagnosis not present

## 2023-03-24 DIAGNOSIS — I252 Old myocardial infarction: Secondary | ICD-10-CM | POA: Diagnosis not present

## 2023-03-24 DIAGNOSIS — G5601 Carpal tunnel syndrome, right upper limb: Secondary | ICD-10-CM | POA: Diagnosis not present

## 2023-04-06 DIAGNOSIS — M791 Myalgia, unspecified site: Secondary | ICD-10-CM | POA: Diagnosis not present

## 2023-04-06 DIAGNOSIS — M545 Low back pain, unspecified: Secondary | ICD-10-CM | POA: Diagnosis not present

## 2023-04-06 DIAGNOSIS — M5416 Radiculopathy, lumbar region: Secondary | ICD-10-CM | POA: Diagnosis not present

## 2023-04-06 DIAGNOSIS — M5136 Other intervertebral disc degeneration, lumbar region: Secondary | ICD-10-CM | POA: Diagnosis not present

## 2023-04-06 DIAGNOSIS — M48062 Spinal stenosis, lumbar region with neurogenic claudication: Secondary | ICD-10-CM | POA: Diagnosis not present

## 2023-04-24 ENCOUNTER — Encounter: Payer: Self-pay | Admitting: Cardiovascular Disease

## 2023-04-24 ENCOUNTER — Ambulatory Visit (INDEPENDENT_AMBULATORY_CARE_PROVIDER_SITE_OTHER): Payer: Medicare HMO | Admitting: Cardiovascular Disease

## 2023-04-24 VITALS — BP 120/70 | HR 50 | Ht 63.0 in | Wt 139.0 lb

## 2023-04-24 DIAGNOSIS — I10 Essential (primary) hypertension: Secondary | ICD-10-CM | POA: Diagnosis not present

## 2023-04-24 DIAGNOSIS — R001 Bradycardia, unspecified: Secondary | ICD-10-CM | POA: Diagnosis not present

## 2023-04-24 DIAGNOSIS — R42 Dizziness and giddiness: Secondary | ICD-10-CM | POA: Diagnosis not present

## 2023-04-24 MED ORDER — LOSARTAN POTASSIUM 25 MG PO TABS: 25.0000 mg | ORAL_TABLET | Freq: Every day | ORAL | 4 refills | Status: DC

## 2023-04-24 NOTE — Progress Notes (Signed)
Cardiology Office Note   Date:  04/24/2023   ID:  SAMA CIALLELLA, DOB 09/16/1961, MRN [id number]  PCP:  Miki Kins, FNP  Cardiologist:  Adrian Blackwater, MD      History of Present Illness: Kayla Malone is a 62 y.o. female who presents for  Chief Complaint  Patient presents with   Acute Visit    Light headed, BP low    Patient in office complaining of dizziness, low blood pressure.     Past Medical History:  Diagnosis Date   Adenomatous colon polyp 02/20/2014   Asthma    Benign neoplasm of colon 02/20/2014   Chest pain 09/13/2013   Coronary angioplasty status 11/13/2018   Coronary artery disease    Depression    Diabetes mellitus without complication (HCC)    GERD (gastroesophageal reflux disease)    History of hiatal hernia    Hypertension    Hypokalemia 05/29/2019   Hypomagnesemia 07/02/2022   Nausea with vomiting, unspecified 06/07/2019   NSTEMI (non-ST elevated myocardial infarction) (HCC) 09/23/2018   Pain    BACK   Stroke (HCC)    x3   Thyroid cyst    TIA (transient ischemic attack) 08/06/2017   Unstable angina (HCC) 10/20/2017   Weight loss 06/07/2019     Past Surgical History:  Procedure Laterality Date   ABDOMINAL HYSTERECTOMY     CARDIAC SURGERY     CATARACT EXTRACTION W/PHACO Left 08/15/2017   Procedure: CATARACT EXTRACTION PHACO AND INTRAOCULAR LENS PLACEMENT ([facility]);  Surgeon: Galen Manila, MD;  Location: [facility];  Service: Ophthalmology;  Laterality: Left;  [place] 00:40.6 AP% 7.3 CDE 2.98 FLUID PACK LOT # [id number] h   CORONARY ARTERY BYPASS GRAFT N/A 10/23/2017   Procedure: CORONARY ARTERY BYPASS GRAFTING (CABG) x two  , using left internal mammary artery and right leg greater saphenous vein harvested  endoscopically - LIMA to LAD, SVG to RCA;  Surgeon: Delight Ovens, MD;  Location: [facility];  Service: Open Heart Surgery;  Laterality: N/A;   CORONARY STENT INTERVENTION N/A 09/24/2018   Procedure: CORONARY STENT  INTERVENTION;  Surgeon: Corky Crafts, MD;  Location: [facility];  Service: Cardiovascular;  Laterality: N/A;   ESOPHAGOGASTRODUODENOSCOPY N/A 05/30/2019   Procedure: ESOPHAGOGASTRODUODENOSCOPY (EGD);  Surgeon: Toledo, Boykin Nearing, MD;  Location: [facility];  Service: Gastroenterology;  Laterality: N/A;   EYE SURGERY     LEFT HEART CATH AND CORONARY ANGIOGRAPHY Left 10/09/2017   Procedure: LEFT HEART CATH AND CORONARY ANGIOGRAPHY;  Surgeon: Laurier Nancy, MD;  Location: [facility];  Service: Cardiovascular;  Laterality: Left;   LEFT HEART CATH AND CORS/GRAFTS ANGIOGRAPHY N/A 09/24/2018   Procedure: LEFT HEART CATH AND CORS/GRAFTS ANGIOGRAPHY;  Surgeon: Corky Crafts, MD;  Location: [facility];  Service: Cardiovascular;  Laterality: N/A;   LEFT HEART CATH AND CORS/GRAFTS ANGIOGRAPHY N/A 07/04/2022   Procedure: LEFT HEART CATH AND CORS/GRAFTS ANGIOGRAPHY;  Surgeon: Laurier Nancy, MD;  Location: [facility];  Service: Cardiovascular;  Laterality: N/A;   TEE WITHOUT CARDIOVERSION N/A 10/23/2017   Procedure: TRANSESOPHAGEAL ECHOCARDIOGRAM (TEE);  Surgeon: Delight Ovens, MD;  Location: [facility];  Service: Open Heart Surgery;  Laterality: N/A;     Current Outpatient Medications  Medication Sig Dispense Refill   Accu-Chek Softclix Lancets lancets      acetaminophen (TYLENOL) 500 MG tablet Take 1,000 mg by mouth every 6 (six) hours as needed for mild pain or headache.  aspirin EC 81 MG tablet Take 81 mg by mouth daily.     atorvastatin (LIPITOR) 80 MG tablet TAKE 1 TABLET EVERY DAY 90 tablet 3   Blood Glucose Monitoring Suppl (ACCU-CHEK GUIDE ME) w/Device KIT      chlorthalidone (HYGROTON) 25 MG tablet Chlorthalidone 25 MG Oral Tablet QTY: 90 tablet Days: 90 Refills: 2  Written: 04/09/20 Patient Instructions: once a day     clopidogrel (PLAVIX) 75 MG tablet TAKE 1 TABLET EVERY DAY 90 tablet 3   fluticasone (FLONASE) 50 MCG/ACT nasal spray Place  2 sprays into both nostrils daily.     glucose blood (ACCU-CHEK GUIDE) test strip Blood Glucose Test In Vitro Strip QTY: 100 strip Days: 90 Refills: 3  Written: 06/07/19 Patient Instructions: E11.9 - to check blodd sugars once daily     isosorbide mononitrate (IMDUR) 30 MG 24 hr tablet Take 30 mg by mouth daily.     losartan (COZAAR) 25 MG tablet Take 1 tablet (25 mg total) by mouth daily. 30 tablet 4   methocarbamol (ROBAXIN) 500 MG tablet Take 500 mg by mouth at bedtime as needed for muscle spasms.     metoprolol succinate (TOPROL-XL) 50 MG 24 hr tablet Take 25 mg by mouth daily. Take with or immediately following a meal.     nitroGLYCERIN (NITROSTAT) 0.4 MG SL tablet Place 1 tablet (0.4 mg total) under the tongue every 5 (five) minutes x 3 doses as needed for chest pain. 25 tablet 0   ondansetron (ZOFRAN-ODT) 4 MG disintegrating tablet Take 4 mg by mouth every 8 (eight) hours as needed for nausea or vomiting.     pantoprazole (PROTONIX) 40 MG tablet Take 40 mg by mouth.      potassium chloride SA (KLOR-CON M) 20 MEQ tablet Take 20 mEq by mouth daily.     Vitamin D, Ergocalciferol, (DRISDOL) 1.25 MG (50000 UNIT) CAPS capsule Take 1 capsule (50,000 Units total) by mouth every 7 (seven) days. 12 capsule 1   No current facility-administered medications for this visit.    Allergies:   Lisinopril, Meloxicam, Tramadol, Amoxicillin, Amoxicillin-pot clavulanate, and Clavulanic acid    Social History:   reports that she quit smoking about 24 years ago. Her smoking use included cigarettes. She has never used smokeless tobacco. She reports that she does not drink alcohol and does not use drugs.   Family History:  family history includes COPD in her brother; Cirrhosis in her father; Coronary artery disease in her brother, father, and sister; Diabetes type II in her father; Heart failure in her mother; Hypertension in her father; Kidney disease in her father; Pulmonary embolism in her mother; Stroke in her  sister.    ROS:     Review of Systems  Constitutional: Negative.   HENT: Negative.    Eyes: Negative.   Respiratory: Negative.    Cardiovascular: Negative.   Gastrointestinal: Negative.   Genitourinary: Negative.   Musculoskeletal: Negative.   Skin: Negative.   Neurological:  Positive for dizziness.  Endo/Heme/Allergies: Negative.   Psychiatric/Behavioral: Negative.    All other systems reviewed and are negative.   All other systems are reviewed and negative.   PHYSICAL EXAM: VS:  BP 120/70   Pulse (!) 50   Ht 5\' 3"  (1.6 m)   Wt 139 lb (63 kg)   SpO2 98%   BMI 24.62 kg/m  , BMI Body mass index is 24.62 kg/m. Last weight:  Wt Readings from Last 3 Encounters:  04/24/23 139 lb (63 kg)  03/06/23 141 lb 3.2 oz (64 kg)  02/20/23 130 lb 9.6 oz (59.2 kg)   Physical Exam Constitutional:      Appearance: Normal appearance.  Cardiovascular:     Rate and Rhythm: Normal rate and regular rhythm.     Heart sounds: Normal heart sounds.  Pulmonary:     Effort: Pulmonary effort is normal.     Breath sounds: Normal breath sounds.  Musculoskeletal:     Right lower leg: No edema.     Left lower leg: No edema.  Neurological:     Mental Status: She is alert.     EKG: none today  Recent Labs: 02/09/2023: TSH 1.430 02/18/2023: ALT 9; B Natriuretic Peptide 16.0; Hemoglobin 12.5; Magnesium 1.7; Platelets 298 02/20/2023: BUN 14; Creatinine, Ser 1.18; Potassium 3.8; Sodium 137    Lipid Panel    Component Value Date/Time   CHOL 154 02/09/2023 1027   TRIG 81 02/09/2023 1027   HDL 52 02/09/2023 1027   CHOLHDL 3.0 02/09/2023 1027   CHOLHDL 4.0 07/04/2022 0623   VLDL 14 07/04/2022 0623   LDLCALC 87 02/09/2023 1027     ASSESSMENT AND PLAN:    ICD-10-CM   1. Dizziness  R42     2. Benign essential hypertension  I10     3. Bradycardia  R00.1        Problem List Items Addressed This Visit       Cardiovascular and Mediastinum   Benign essential hypertension   Relevant  Medications   losartan (COZAAR) 25 MG tablet     Other   Dizziness - Primary    Patient in office complaining of dizziness that started yesterday evening. Denies diarrhea, vomiting. HR low, decrease metoprolol to 25 mg daily. Decrease losartan to 25 mg. Patient admits to not drinking any fluids this morning. Patient to push fluids for the remainder of the day.       Bradycardia     Disposition:   Return in about 2 weeks (around 05/08/2023).    Total time spent: 30 minutes  Signed,  Adrian Blackwater, MD  04/24/2023 11:23 AM    [facility]

## 2023-04-24 NOTE — Patient Instructions (Signed)
Decrease losartan to 25 mg daily starting tomorrow.  Decrease metoprolol to 25 mg (1/2 of 50 mg) daily starti g tomorrow.

## 2023-04-24 NOTE — Assessment & Plan Note (Addendum)
Patient in office complaining of dizziness that started yesterday evening. Denies diarrhea, vomiting. HR low, decrease metoprolol to 25 mg daily. Decrease losartan to 25 mg. Patient admits to not drinking any fluids this morning. Patient to push fluids for the remainder of the day.

## 2023-05-04 ENCOUNTER — Ambulatory Visit: Payer: Medicare HMO | Admitting: Cardiovascular Disease

## 2023-05-08 ENCOUNTER — Ambulatory Visit: Payer: Medicare HMO | Admitting: Cardiovascular Disease

## 2023-05-09 ENCOUNTER — Encounter: Payer: Self-pay | Admitting: Cardiovascular Disease

## 2023-05-09 ENCOUNTER — Ambulatory Visit (INDEPENDENT_AMBULATORY_CARE_PROVIDER_SITE_OTHER): Payer: Medicare HMO | Admitting: Cardiovascular Disease

## 2023-05-09 VITALS — BP 116/64 | HR 59 | Ht 63.0 in | Wt 138.4 lb

## 2023-05-09 DIAGNOSIS — I739 Peripheral vascular disease, unspecified: Secondary | ICD-10-CM

## 2023-05-09 DIAGNOSIS — T82857A Stenosis of cardiac prosthetic devices, implants and grafts, initial encounter: Secondary | ICD-10-CM | POA: Diagnosis not present

## 2023-05-09 DIAGNOSIS — R001 Bradycardia, unspecified: Secondary | ICD-10-CM

## 2023-05-09 DIAGNOSIS — I2510 Atherosclerotic heart disease of native coronary artery without angina pectoris: Secondary | ICD-10-CM | POA: Diagnosis not present

## 2023-05-09 DIAGNOSIS — E782 Mixed hyperlipidemia: Secondary | ICD-10-CM

## 2023-05-09 DIAGNOSIS — I10 Essential (primary) hypertension: Secondary | ICD-10-CM

## 2023-05-09 DIAGNOSIS — R0789 Other chest pain: Secondary | ICD-10-CM | POA: Diagnosis not present

## 2023-05-09 NOTE — Progress Notes (Signed)
Cardiology Office Note   Date:  05/09/2023   ID:  MEGA HULME, DOB 10/23/1961, MRN [id number]  PCP:  Miki Kins, FNP  Cardiologist:  Adrian Blackwater, MD      History of Present Illness: Kayla Malone is a 62 y.o. female who presents for  Chief Complaint  Patient presents with   Follow-up    2 week follow up    Feels sore in chest      Past Medical History:  Diagnosis Date   Adenomatous colon polyp 02/20/2014   Asthma    Benign neoplasm of colon 02/20/2014   Chest pain 09/13/2013   Coronary angioplasty status 11/13/2018   Coronary artery disease    Depression    Diabetes mellitus without complication (HCC)    GERD (gastroesophageal reflux disease)    History of hiatal hernia    Hypertension    Hypokalemia 05/29/2019   Hypomagnesemia 07/02/2022   Nausea with vomiting, unspecified 06/07/2019   NSTEMI (non-ST elevated myocardial infarction) (HCC) 09/23/2018   Pain    BACK   Stroke (HCC)    x3   Thyroid cyst    TIA (transient ischemic attack) 08/06/2017   Unstable angina (HCC) 10/20/2017   Weight loss 06/07/2019     Past Surgical History:  Procedure Laterality Date   ABDOMINAL HYSTERECTOMY     CARDIAC SURGERY     CATARACT EXTRACTION W/PHACO Left 08/15/2017   Procedure: CATARACT EXTRACTION PHACO AND INTRAOCULAR LENS PLACEMENT (IOC);  Surgeon: Galen Manila, MD;  Location: [facility] ORS;  Service: Ophthalmology;  Laterality: Left;  [place] 00:40.6 AP% 7.3 CDE 2.98 FLUID PACK LOT # [id number]   CORONARY ARTERY BYPASS GRAFT N/A 10/23/2017   Procedure: CORONARY ARTERY BYPASS GRAFTING (CABG) x two  , using left internal mammary artery and right leg greater saphenous vein harvested  endoscopically - LIMA to LAD, SVG to RCA;  Surgeon: Delight Ovens, MD;  Location: [facility];  Service: Open Heart Surgery;  Laterality: N/A;   CORONARY STENT INTERVENTION N/A 09/24/2018   Procedure: CORONARY STENT INTERVENTION;  Surgeon: Corky Crafts, MD;   Location: [facility];  Service: Cardiovascular;  Laterality: N/A;   ESOPHAGOGASTRODUODENOSCOPY N/A 05/30/2019   Procedure: ESOPHAGOGASTRODUODENOSCOPY (EGD);  Surgeon: Toledo, Boykin Nearing, MD;  Location: [facility];  Service: Gastroenterology;  Laterality: N/A;   EYE SURGERY     LEFT HEART CATH AND CORONARY ANGIOGRAPHY Left 10/09/2017   Procedure: LEFT HEART CATH AND CORONARY ANGIOGRAPHY;  Surgeon: Laurier Nancy, MD;  Location: [facility] CV [facility];  Service: Cardiovascular;  Laterality: Left;   LEFT HEART CATH AND CORS/GRAFTS ANGIOGRAPHY N/A 09/24/2018   Procedure: LEFT HEART CATH AND CORS/GRAFTS ANGIOGRAPHY;  Surgeon: Corky Crafts, MD;  Location: [facility] CV [facility];  Service: Cardiovascular;  Laterality: N/A;   LEFT HEART CATH AND CORS/GRAFTS ANGIOGRAPHY N/A 07/04/2022   Procedure: LEFT HEART CATH AND CORS/GRAFTS ANGIOGRAPHY;  Surgeon: Laurier Nancy, MD;  Location: [facility] IN[facility] CV [facility];  Service: Cardiovascular;  Laterality: N/A;   TEE WITHOUT CARDIOVERSION N/A 10/23/2017   Procedure: TRANSESOPHAGEAL ECHOCARDIOGRAM (TEE);  Surgeon: Delight Ovens, MD;  Location: [facility] OR;  Service: Open Heart Surgery;  Laterality: N/A;     Current Outpatient Medications  Medication Sig Dispense Refill   Accu-Chek Softclix Lancets lancets      acetaminophen (TYLENOL) 500 MG tablet Take 1,000 mg by mouth every 6 (six) hours as needed for mild pain or headache.     aspirin EC 81 MG  tablet Take 81 mg by mouth daily.     atorvastatin (LIPITOR) 80 MG tablet TAKE 1 TABLET EVERY DAY 90 tablet 3   Blood Glucose Monitoring Suppl (ACCU-CHEK GUIDE ME) w/Device KIT      chlorthalidone (HYGROTON) 25 MG tablet Chlorthalidone 25 MG Oral Tablet QTY: 90 tablet Days: 90 Refills: 2  Written: 04/09/20 Patient Instructions: once a day     clopidogrel (PLAVIX) 75 MG tablet TAKE 1 TABLET EVERY DAY 90 tablet 3   fluticasone (FLONASE) 50 MCG/ACT nasal spray Place 2 sprays into both nostrils daily.     glucose  blood (ACCU-CHEK GUIDE) test strip Blood Glucose Test In Vitro Strip QTY: 100 strip Days: 90 Refills: 3  Written: 06/07/19 Patient Instructions: E11.9 - to check blodd sugars once daily     isosorbide mononitrate (IMDUR) 30 MG 24 hr tablet Take 30 mg by mouth daily.     losartan (COZAAR) 25 MG tablet Take 1 tablet (25 mg total) by mouth daily. 30 tablet 4   methocarbamol (ROBAXIN) 500 MG tablet Take 500 mg by mouth at bedtime as needed for muscle spasms.     metoprolol succinate (TOPROL-XL) 50 MG 24 hr tablet Take 25 mg by mouth daily. Take with or immediately following a meal.     nitroGLYCERIN (NITROSTAT) 0.4 MG SL tablet Place 1 tablet (0.4 mg total) under the tongue every 5 (five) minutes x 3 doses as needed for chest pain. 25 tablet 0   ondansetron (ZOFRAN-ODT) 4 MG disintegrating tablet Take 4 mg by mouth every 8 (eight) hours as needed for nausea or vomiting.     pantoprazole (PROTONIX) 40 MG tablet Take 40 mg by mouth.      potassium chloride SA (KLOR-CON M) 20 MEQ tablet Take 20 mEq by mouth daily.     Vitamin D, Ergocalciferol, (DRISDOL) 1.25 MG (50000 UNIT) CAPS capsule Take 1 capsule (50,000 Units total) by mouth every 7 (seven) days. 12 capsule 1   No current facility-administered medications for this visit.    Allergies:   Lisinopril, Meloxicam, Tramadol, Amoxicillin, Amoxicillin-pot clavulanate, and Clavulanic acid    Social History:   reports that she quit smoking about 24 years ago. Her smoking use included cigarettes. She has never used smokeless tobacco. She reports that she does not drink alcohol and does not use drugs.   Family History:  family history includes COPD in her brother; Cirrhosis in her father; Coronary artery disease in her brother, father, and sister; Diabetes type II in her father; Heart failure in her mother; Hypertension in her father; Kidney disease in her father; Pulmonary embolism in her mother; Stroke in her sister.    ROS:     Review of Systems   Constitutional: Negative.   HENT: Negative.    Eyes: Negative.   Respiratory: Negative.    Gastrointestinal: Negative.   Genitourinary: Negative.   Musculoskeletal: Negative.   Skin: Negative.   Neurological: Negative.   Endo/Heme/Allergies: Negative.   Psychiatric/Behavioral: Negative.    All other systems reviewed and are negative.     All other systems are reviewed and negative.    PHYSICAL EXAM: VS:  BP 116/64   Pulse (!) 59   Ht 5\' 3"  (1.6 m)   Wt 138 lb 6.4 oz (62.8 kg)   SpO2 98%   BMI 24.52 kg/m  , BMI Body mass index is 24.52 kg/m. Last weight:  Wt Readings from Last 3 Encounters:  05/09/23 138 lb 6.4 oz (62.8 kg)  04/24/23 139  lb (63 kg)  03/06/23 141 lb 3.2 oz (64 kg)     Physical Exam Constitutional:      Appearance: Normal appearance.  Cardiovascular:     Rate and Rhythm: Normal rate and regular rhythm.     Heart sounds: Normal heart sounds.  Pulmonary:     Effort: Pulmonary effort is normal.     Breath sounds: Normal breath sounds.  Musculoskeletal:     Right lower leg: No edema.     Left lower leg: No edema.  Neurological:     Mental Status: She is alert.       EKG:   Recent Labs: 02/09/2023: TSH 1.430 02/18/2023: ALT 9; B Natriuretic Peptide 16.0; Hemoglobin 12.5; Magnesium 1.7; Platelets 298 02/20/2023: BUN 14; Creatinine, Ser 1.18; Potassium 3.8; Sodium 137    Lipid Panel    Component Value Date/Time   CHOL 154 02/09/2023 1027   TRIG 81 02/09/2023 1027   HDL 52 02/09/2023 1027   CHOLHDL 3.0 02/09/2023 1027   CHOLHDL 4.0 07/04/2022 0623   VLDL 14 07/04/2022 0623   LDLCALC 87 02/09/2023 1027      Other studies Reviewed: Additional studies/ records that were reviewed today include:  Review of the above records demonstrates:       No data to display            ASSESSMENT AND PLAN:    ICD-10-CM   1. Atherosclerosis of native coronary artery of native heart without angina pectoris  I25.10     2. Benign essential  hypertension  I10     3. Coronary graft stenosis, initial encounter  T82.857A    stress test normal    4. Peripheral vascular disease (HCC)  I73.9     5. Bradycardia  R00.1    Ideal HR for CAD    6. Mixed hyperlipidemia  E78.2     7. Chest pain, non-cardiac  R07.89    soreness not related to CAD       Problem List Items Addressed This Visit       Cardiovascular and Mediastinum   Benign essential hypertension   Atherosclerotic heart disease of native coronary artery without angina pectoris - Primary   Coronary graft stenosis   Peripheral vascular disease (HCC)     Other   Mixed hyperlipidemia   Bradycardia   Other Visit Diagnoses     Chest pain, non-cardiac       soreness not related to CAD          Disposition:   Return in about 3 months (around 08/09/2023).    Total time spent: 30 minutes  Signed,  Adrian Blackwater, MD  05/09/2023 2:09 PM    [facility]

## 2023-05-10 ENCOUNTER — Ambulatory Visit
Admission: RE | Admit: 2023-05-10 | Discharge: 2023-05-10 | Disposition: A | Discharge status: Home / Self Care | Payer: Medicare HMO | Source: Ambulatory Visit | Attending: Family | Admitting: Family

## 2023-05-10 DIAGNOSIS — M85852 Other specified disorders of bone density and structure, left thigh: Secondary | ICD-10-CM | POA: Diagnosis not present

## 2023-05-10 DIAGNOSIS — N951 Menopausal and female climacteric states: Secondary | ICD-10-CM | POA: Diagnosis present

## 2023-05-10 DIAGNOSIS — Z780 Asymptomatic menopausal state: Secondary | ICD-10-CM | POA: Insufficient documentation

## 2023-05-11 ENCOUNTER — Ambulatory Visit (INDEPENDENT_AMBULATORY_CARE_PROVIDER_SITE_OTHER): Payer: Medicare HMO | Admitting: Family

## 2023-05-11 ENCOUNTER — Encounter: Payer: Self-pay | Admitting: Family

## 2023-05-11 VITALS — BP 118/68 | HR 54 | Ht 63.0 in | Wt 138.6 lb

## 2023-05-11 DIAGNOSIS — M85852 Other specified disorders of bone density and structure, left thigh: Secondary | ICD-10-CM

## 2023-05-11 DIAGNOSIS — R001 Bradycardia, unspecified: Secondary | ICD-10-CM

## 2023-05-11 DIAGNOSIS — E1165 Type 2 diabetes mellitus with hyperglycemia: Secondary | ICD-10-CM

## 2023-05-11 DIAGNOSIS — I25118 Atherosclerotic heart disease of native coronary artery with other forms of angina pectoris: Secondary | ICD-10-CM

## 2023-05-11 DIAGNOSIS — E559 Vitamin D deficiency, unspecified: Secondary | ICD-10-CM

## 2023-05-11 DIAGNOSIS — E039 Hypothyroidism, unspecified: Secondary | ICD-10-CM

## 2023-05-11 DIAGNOSIS — E538 Deficiency of other specified B group vitamins: Secondary | ICD-10-CM

## 2023-05-11 DIAGNOSIS — I10 Essential (primary) hypertension: Secondary | ICD-10-CM

## 2023-05-11 DIAGNOSIS — E782 Mixed hyperlipidemia: Secondary | ICD-10-CM

## 2023-05-11 LAB — POCT CBG (FASTING - GLUCOSE)-MANUAL ENTRY: Glucose Fasting, POC: 99 mg/dL (ref 70–99)

## 2023-05-11 NOTE — Assessment & Plan Note (Signed)
Checking labs today.  Will continue supplements as needed.  

## 2023-05-11 NOTE — Assessment & Plan Note (Signed)
We discussed that her Bone Density scan does show some osteopenia, which is a precursor to osteoporosis.  She should pick up Caltrate +D and take them twice daily, and do weight bearing exercises like walking.   We will repeat in 1 year.

## 2023-05-11 NOTE — Assessment & Plan Note (Signed)
Patient had recent medication changes by cardiology (2 days ago).  Will message to let them know that HR is still low despite changes in meds and even prior to medications today.   Defer to them for further changes. She has follow up next week.

## 2023-05-11 NOTE — Progress Notes (Signed)
Established Patient Office Visit  Subjective:  Patient ID: Kayla Malone, female    DOB: [birth year]  Age: 62 y.o. MRN: [id number]  Chief Complaint  Patient presents with   Follow-up    3 month follow up    Patient is here today for her 3 months follow up.  She has been feeling fairly well since last appointment.   She does have additional concerns to discuss today.   1) Patient is working with cardiology regarding her HR.  It is low today, despite medication regimen changes and the fact that she has not yet taken her meds today.   Labs are due today. She needs refills.   I have reviewed her active problem list, medication list, allergies, notes from last encounter, and lab results for her appointment today.      No other concerns at this time.   Past Medical History:  Diagnosis Date   Adenomatous colon polyp 02/20/2014   Asthma    Benign neoplasm of colon 02/20/2014   Chest pain 09/13/2013   Coronary angioplasty status 11/13/2018   Coronary artery disease    Depression    Diabetes mellitus without complication (HCC)    GERD (gastroesophageal reflux disease)    History of hiatal hernia    Hypertension    Hypokalemia 05/29/2019   Hypomagnesemia 07/02/2022   Nausea with vomiting, unspecified 06/07/2019   NSTEMI (non-ST elevated myocardial infarction) (HCC) 09/23/2018   Pain    BACK   Stroke (HCC)    x3   Thyroid cyst    TIA (transient ischemic attack) 08/06/2017   Unstable angina (HCC) 10/20/2017   Weight loss 06/07/2019    Past Surgical History:  Procedure Laterality Date   ABDOMINAL HYSTERECTOMY     CARDIAC SURGERY     CATARACT EXTRACTION W/PHACO Left 08/15/2017   Procedure: CATARACT EXTRACTION PHACO AND INTRAOCULAR LENS PLACEMENT (IOC);  Surgeon: Galen Manila, MD;  Location: [facility] ORS;  Service: Ophthalmology;  Laterality: Left;  [place] 00:40.6 AP% 7.3 CDE 2.98 FLUID PACK LOT # [id number]   CORONARY ARTERY BYPASS GRAFT N/A 10/23/2017   Procedure:  CORONARY ARTERY BYPASS GRAFTING (CABG) x two  , using left internal mammary artery and right leg greater saphenous vein harvested  endoscopically - LIMA to LAD, SVG to RCA;  Surgeon: Delight Ovens, MD;  Location: [facility] OR;  Service: Open Heart Surgery;  Laterality: N/A;   CORONARY STENT INTERVENTION N/A 09/24/2018   Procedure: CORONARY STENT INTERVENTION;  Surgeon: Corky Crafts, MD;  Location: [facility] INVASIVE CV LAB;  Service: Cardiovascular;  Laterality: N/A;   ESOPHAGOGASTRODUODENOSCOPY N/A 05/30/2019   Procedure: ESOPHAGOGASTRODUODENOSCOPY (EGD);  Surgeon: Toledo, Boykin Nearing, MD;  Location: [facility] ENDOSCOPY;  Service: Gastroenterology;  Laterality: N/A;   EYE SURGERY     LEFT HEART CATH AND CORONARY ANGIOGRAPHY Left 10/09/2017   Procedure: LEFT HEART CATH AND CORONARY ANGIOGRAPHY;  Surgeon: Laurier Nancy, MD;  Location: [facility] INVASIVE CV LAB;  Service: Cardiovascular;  Laterality: Left;   LEFT HEART CATH AND CORS/GRAFTS ANGIOGRAPHY N/A 09/24/2018   Procedure: LEFT HEART CATH AND CORS/GRAFTS ANGIOGRAPHY;  Surgeon: Corky Crafts, MD;  Location: [facility] INVASIVE CV LAB;  Service: Cardiovascular;  Laterality: N/A;   LEFT HEART CATH AND CORS/GRAFTS ANGIOGRAPHY N/A 07/04/2022   Procedure: LEFT HEART CATH AND CORS/GRAFTS ANGIOGRAPHY;  Surgeon: Laurier Nancy, MD;  Location: [facility] INVASIVE CV LAB;  Service: Cardiovascular;  Laterality: N/A;   TEE WITHOUT CARDIOVERSION N/A 10/23/2017   Procedure: TRANSESOPHAGEAL ECHOCARDIOGRAM (TEE);  Surgeon:  Delight Ovens, MD;  Location: [facility] OR;  Service: Open Heart Surgery;  Laterality: N/A;    Social History   Socioeconomic History   Marital status: Married    Spouse name: Not on file   Number of children: Not on file   Years of education: Not on file   Highest education level: Not on file  Occupational History   Not on file  Tobacco Use   Smoking status: Former    Types: Cigarettes    Quit date: 09/13/1998    Years since quitting: 24.6    Smokeless tobacco: Never  Vaping Use   Vaping Use: Never used  Substance and Sexual Activity   Alcohol use: No   Drug use: No   Sexual activity: Yes    Birth control/protection: Post-menopausal  Other Topics Concern   Not on file  Social History Narrative   Not on file   Social Determinants of Health   Financial Resource Strain: Not on file  Food Insecurity: Not on file  Transportation Needs: Not on file  Physical Activity: Not on file  Stress: Not on file  Social Connections: Not on file  Intimate Partner Violence: Not on file    Family History  Problem Relation Age of Onset   Heart failure Mother    Pulmonary embolism Mother    Cirrhosis Father    Coronary artery disease Father    Diabetes type II Father    Hypertension Father    Kidney disease Father    Coronary artery disease Sister    Stroke Sister    COPD Brother    Coronary artery disease Brother    Breast cancer Neg Hx     Allergies  Allergen Reactions   Lisinopril Swelling and Shortness Of Breath   Meloxicam Nausea Only and Nausea And Vomiting   Tramadol Nausea Only and Other (See Comments)    sleepiness  Other reaction(s): Dizziness, Other, Other (See Comments)  somnolence  Other reaction(s): Other (See Comments), Other (See Comments)  Would not wake up after taking this medication   Amoxicillin Rash    Don't remember  Don't remember  Don't remember    Amoxicillin-Pot Clavulanate Hives and Rash   Clavulanic Acid Rash    Review of Systems  Musculoskeletal:  Positive for joint pain (right hip).  All other systems reviewed and are negative.      Objective:   BP 118/68   Pulse (!) 54   Ht 5\' 3"  (1.6 m)   Wt 138 lb 9.6 oz (62.9 kg)   SpO2 99%   BMI 24.55 kg/m   Vitals:   05/11/23 0922  BP: 118/68  Pulse: (!) 54  Height: 5\' 3"  (1.6 m)  Weight: 138 lb 9.6 oz (62.9 kg)  SpO2: 99%  BMI (Calculated): 24.56    Physical Exam Vitals and nursing note reviewed.  Constitutional:       General: She is awake.     Appearance: Normal appearance. She is well-developed, well-groomed and normal weight.  HENT:     Head: Normocephalic.  Eyes:     Pupils: Pupils are equal, round, and reactive to light.  Cardiovascular:     Rate and Rhythm: Normal rate.  Pulmonary:     Effort: Pulmonary effort is normal.  Neurological:     General: No focal deficit present.     Mental Status: She is alert and oriented to person, place, and time. Mental status is at baseline.     Gait: Gait abnormal (  limping on right due to pain in right hip and knee.).  Psychiatric:        Mood and Affect: Mood normal.        Behavior: Behavior normal. Behavior is cooperative.        Thought Content: Thought content normal.        Judgment: Judgment normal.      Results for orders placed or performed in visit on 05/11/23  POCT CBG (Fasting - Glucose)  Result Value Ref Range   Glucose Fasting, POC 99 70 - 99 mg/dL    Recent Results (from the past 2160 hour(s))  CBC with Differential     Status: None   Collection Time: 02/18/23 11:51 PM  Result Value Ref Range   WBC 5.8 4.0 - 10.5 K/uL   RBC 4.39 3.87 - 5.11 MIL/uL   Hemoglobin 12.5 12.0 - 15.0 g/dL   HCT 40.9 81.1 - 91.4 %   MCV 85.0 80.0 - 100.0 fL   MCH 28.5 26.0 - 34.0 pg   MCHC 33.5 30.0 - 36.0 g/dL   RDW 78.2 95.6 - 21.3 %   Platelets 298 150 - 400 K/uL   nRBC 0.0 0.0 - 0.2 %   Neutrophils Relative % 46 %   Neutro Abs 2.8 1.7 - 7.7 K/uL   Lymphocytes Relative 44 %   Lymphs Abs 2.5 0.7 - 4.0 K/uL   Monocytes Relative 8 %   Monocytes Absolute 0.4 0.1 - 1.0 K/uL   Eosinophils Relative 1 %   Eosinophils Absolute 0.1 0.0 - 0.5 K/uL   Basophils Relative 1 %   Basophils Absolute 0.1 0.0 - 0.1 K/uL   Immature Granulocytes 0 %   Abs Immature Granulocytes 0.01 0.00 - 0.07 K/uL    Comment: Performed at [facility], [address] [place], [place] [address]  Comprehensive metabolic panel     Status: Abnormal   Collection Time:  02/18/23 11:51 PM  Result Value Ref Range   Sodium 133 (L) 135 - 145 mmol/L   Potassium 2.7 (LL) 3.5 - 5.1 mmol/L    Comment: CRITICAL RESULT CALLED TO, READ BACK BY AND VERIFIED WITH JOSH EARHART AT 0058 02/19/2023 DLB    Chloride 96 (L) 98 - 111 mmol/L   CO2 27 22 - 32 mmol/L   Glucose, Bld 151 (H) 70 - 99 mg/dL    Comment: Glucose reference range applies only to samples taken after fasting for at least 8 hours.   BUN 12 8 - 23 mg/dL   Creatinine, Ser 8.46 (H) 0.44 - 1.00 mg/dL   Calcium 9.2 8.9 - 96.2 mg/dL   Total Protein 7.4 6.5 - 8.1 g/dL   Albumin 4.1 3.5 - 5.0 g/dL   AST 16 15 - 41 U/L   ALT 9 0 - 44 U/L   Alkaline Phosphatase 77 38 - 126 U/L   Total Bilirubin 0.8 0.3 - 1.2 mg/dL   GFR, Estimated 57 (L) >60 mL/min    Comment: (NOTE) Calculated using the CKD-EPI Creatinine Equation (2021)    Anion gap 10 5 - 15    Comment: Performed at [facility], [address] [place], [place] [address]  Troponin I (High Sensitivity)     Status: Abnormal   Collection Time: 02/18/23 11:51 PM  Result Value Ref Range   Troponin I (High Sensitivity) 18 (H) <18 ng/L    Comment: (NOTE) Elevated high sensitivity troponin I (hsTnI) values and significant  changes across serial measurements may suggest  ACS but many other  chronic and acute conditions are known to elevate hsTnI results.  Refer to the "Links" section for chest pain algorithms and additional  guidance. Performed at [facility], [address] [place], [place] [address]   Lipase, blood     Status: None   Collection Time: 02/18/23 11:51 PM  Result Value Ref Range   Lipase 47 11 - 51 U/L    Comment: Performed at [facility], [address] [place], [place] [address]  Brain natriuretic peptide     Status: None   Collection Time: 02/18/23 11:54 PM  Result Value Ref Range   B Natriuretic Peptide 16.0 0.0 - 100.0 pg/mL    Comment: Performed at [facility], [address]  [place], [place] [address]  Magnesium     Status: None   Collection Time: 02/18/23 11:54 PM  Result Value Ref Range   Magnesium 1.7 1.7 - 2.4 mg/dL    Comment: Performed at [facility], [address] [place], [place] [address]  Resp panel by RT-PCR (RSV, Flu A&B, Covid) Anterior Nasal Swab     Status: None   Collection Time: 02/19/23 12:49 AM   Specimen: Anterior Nasal Swab  Result Value Ref Range   SARS Coronavirus 2 by RT PCR NEGATIVE NEGATIVE    Comment: (NOTE) SARS-CoV-2 target nucleic acids are NOT DETECTED.  The SARS-CoV-2 RNA is generally detectable in upper respiratory specimens during the acute phase of infection. The lowest concentration of SARS-CoV-2 viral copies this assay can detect is 138 copies/mL. A negative result does not preclude SARS-Cov-2 infection and should not be used as the sole basis for treatment or other patient management decisions. A negative result may occur with  improper specimen collection/handling, submission of specimen other than nasopharyngeal swab, presence of viral mutation(s) within the areas targeted by this assay, and inadequate number of viral copies(<138 copies/mL). A negative result must be combined with clinical observations, patient history, and epidemiological information. The expected result is Negative.  Fact Sheet for Patients:  BloggerCourse.com  Fact Sheet for Healthcare Providers:  SeriousBroker.it  This test is no t yet approved or cleared by the [place] FDA and  has been authorized for detection and/or diagnosis of SARS-CoV-2 by FDA under an Emergency Use Authorization (EUA). This EUA will remain  in effect (meaning this test can be used) for the duration of the COVID-19 declaration under Section 564(b)(1) of the Act, 21 U.S.C.section 360bbb-3(b)(1), unless the authorization is terminated  or revoked sooner.       Influenza A by PCR NEGATIVE NEGATIVE    Influenza B by PCR NEGATIVE NEGATIVE    Comment: (NOTE) The Xpert Xpress SARS-CoV-2/FLU/RSV plus assay is intended as an aid in the diagnosis of influenza from Nasopharyngeal swab specimens and should not be used as a sole basis for treatment. Nasal washings and aspirates are unacceptable for Xpert Xpress SARS-CoV-2/FLU/RSV testing.  Fact Sheet for Patients: BloggerCourse.com  Fact Sheet for Healthcare Providers: SeriousBroker.it  This test is not yet approved or cleared by the [place] FDA and has been authorized for detection and/or diagnosis of SARS-CoV-2 by FDA under an Emergency Use Authorization (EUA). This EUA will remain in effect (meaning this test can be used) for the duration of the COVID-19 declaration under Section 564(b)(1) of the Act, 21 U.S.C. section 360bbb-3(b)(1), unless the authorization is terminated or revoked.     Resp Syncytial Virus by PCR NEGATIVE NEGATIVE    Comment: (NOTE) Fact  Sheet for Patients: BloggerCourse.com  Fact Sheet for Healthcare Providers: SeriousBroker.it  This test is not yet approved or cleared by the [place] FDA and has been authorized for detection and/or diagnosis of SARS-CoV-2 by FDA under an Emergency Use Authorization (EUA). This EUA will remain in effect (meaning this test can be used) for the duration of the COVID-19 declaration under Section 564(b)(1) of the Act, 21 U.S.C. section 360bbb-3(b)(1), unless the authorization is terminated or revoked.  Performed at [facility], [address] [place], [place] [address]   Troponin I (High Sensitivity)     Status: None   Collection Time: 02/19/23  3:22 AM  Result Value Ref Range   Troponin I (High Sensitivity) 16 <18 ng/L    Comment: (NOTE) Elevated high sensitivity troponin I (hsTnI) values and significant  changes across serial measurements may suggest  ACS but many other  chronic and acute conditions are known to elevate hsTnI results.  Refer to the "Links" section for chest pain algorithms and additional  guidance. Performed at [facility], [address] [place], [place] [address]   POCT CBG (Fasting - Glucose)     Status: Normal   Collection Time: 02/20/23  2:07 PM  Result Value Ref Range   Glucose Fasting, POC 98 70 - 99 mg/dL  Basic metabolic panel     Status: Abnormal   Collection Time: 02/20/23  3:19 PM  Result Value Ref Range   Glucose 97 70 - 99 mg/dL   BUN 14 8 - 27 mg/dL   Creatinine, Ser 0.10 (H) 0.57 - 1.00 mg/dL   eGFR 52 (L) >27 OZ/DGU/4.40   BUN/Creatinine Ratio 12 12 - 28   Sodium 137 134 - 144 mmol/L   Potassium 3.8 3.5 - 5.2 mmol/L   Chloride 97 96 - 106 mmol/L   CO2 23 20 - 29 mmol/L   Calcium 10.3 8.7 - 10.3 mg/dL  POCT CBG (Fasting - Glucose)     Status: Normal   Collection Time: 05/11/23  9:28 AM  Result Value Ref Range   Glucose Fasting, POC 99 70 - 99 mg/dL       Assessment & Plan:   Problem List Items Addressed This Visit       Active Problems   Mixed hyperlipidemia    Checking labs today.  Continue current therapy for lipid control. Will modify as needed based on labwork results.       Relevant Orders   Lipid panel   CBC With Differential   CMP14+EGFR   Benign essential hypertension    Blood pressure well controlled with current medications.  Continue current therapy.  Will reassess at follow up.       Relevant Orders   CBC With Differential   CMP14+EGFR   Type 2 diabetes mellitus with hyperglycemia (HCC) - Primary    Checking labs today. Will call pt. With results  Continue current diabetes POC, as patient has been well controlled on current regimen.  Will adjust meds if needed based on labs.       Relevant Orders   POCT CBG (Fasting - Glucose) (Completed)   CBC With Differential   CMP14+EGFR   Hemoglobin A1c   Coronary artery disease of native artery of  native heart with stable angina pectoris [facility])    Patient is under the care of cardiology.  Had recent stress test, was WNL.   Will defer to them for further changes.      Vitamin D deficiency, unspecified  Checking labs today.  Will continue supplements as needed.       Relevant Orders   VITAMIN D 25 Hydroxy (Vit-D Deficiency, Fractures)   CBC With Differential   CMP14+EGFR   Bradycardia    Patient had recent medication changes by cardiology (2 days ago).  Will message to let them know that HR is still low despite changes in meds and even prior to medications today.   Defer to them for further changes. She has follow up next week.       Osteopenia of neck of left femur    We discussed that her Bone Density scan does show some osteopenia, which is a precursor to osteoporosis.  She should pick up Caltrate +D and take them twice daily, and do weight bearing exercises like walking.   We will repeat in 1 year.        Other Visit Diagnoses     B12 deficiency due to diet       Checking labs today Will supplement as needed.   Relevant Orders   CBC With Differential   CMP14+EGFR   Vitamin B12   Hypothyroidism (acquired)       Checking labs today Will adjust meds as needed based on results.  Continue current dosing for the time being.  Will send message with any needed changes.   Relevant Orders   CBC With Differential   CMP14+EGFR   TSH       Return in about 3 months (around 08/11/2023) for F/U.   Total time spent: 30 minutes  Miki Kins, FNP  05/11/2023   This document may have been prepared by [facility] Voice Recognition software and as such may include unintentional dictation errors.

## 2023-05-11 NOTE — Addendum Note (Signed)
Addended by: Adrian Blackwater A on: 05/11/2023 11:16 AM   Modules accepted: Orders

## 2023-05-11 NOTE — Assessment & Plan Note (Signed)
Checking labs today. Will call pt. With results  Continue current diabetes POC, as patient has been well controlled on current regimen.  Will adjust meds if needed based on labs.  

## 2023-05-11 NOTE — Assessment & Plan Note (Signed)
Patient is under the care of cardiology.  Had recent stress test, was WNL.   Will defer to them for further changes.

## 2023-05-11 NOTE — Assessment & Plan Note (Signed)
Checking labs today.  Continue current therapy for lipid control. Will modify as needed based on labwork results.  

## 2023-05-11 NOTE — Assessment & Plan Note (Signed)
Blood pressure well controlled with current medications.  Continue current therapy.  Will reassess at follow up.  

## 2023-05-12 LAB — CMP14+EGFR
ALT: 7 IU/L (ref 0–32)
AST: 13 IU/L (ref 0–40)
Albumin/Globulin Ratio: 1.6 (ref 1.2–2.2)
Albumin: 4 g/dL (ref 3.9–4.9)
Alkaline Phosphatase: 103 IU/L (ref 44–121)
BUN/Creatinine Ratio: 14 (ref 12–28)
BUN: 14 mg/dL (ref 8–27)
Bilirubin Total: 0.3 mg/dL (ref 0.0–1.2)
CO2: 24 mmol/L (ref 20–29)
Calcium: 9.4 mg/dL (ref 8.7–10.3)
Chloride: 100 mmol/L (ref 96–106)
Creatinine, Ser: 1.03 mg/dL — ABNORMAL HIGH (ref 0.57–1.00)
Globulin, Total: 2.5 g/dL (ref 1.5–4.5)
Glucose: 111 mg/dL — ABNORMAL HIGH (ref 70–99)
Potassium: 4.5 mmol/L (ref 3.5–5.2)
Sodium: 140 mmol/L (ref 134–144)
Total Protein: 6.5 g/dL (ref 6.0–8.5)
eGFR: 61 mL/min/{1.73_m2} (ref >59)

## 2023-05-12 LAB — LIPID PANEL
Chol/HDL Ratio: 2.8 ratio (ref 0.0–4.4)
Cholesterol, Total: 138 mg/dL (ref 100–199)
HDL: 49 mg/dL (ref >39)
LDL Chol Calc (NIH): 77 mg/dL (ref 0–99)
Triglycerides: 55 mg/dL (ref 0–149)
VLDL Cholesterol Cal: 12 mg/dL (ref 5–40)

## 2023-05-12 LAB — CBC WITH DIFFERENTIAL
Basophils Absolute: 0.1 10*3/uL (ref 0.0–0.2)
Basos: 1 %
EOS (ABSOLUTE): 0.1 10*3/uL (ref 0.0–0.4)
Eos: 2 %
Hematocrit: 36.7 % (ref 34.0–46.6)
Hemoglobin: 11.9 g/dL (ref 11.1–15.9)
Immature Grans (Abs): 0 10*3/uL (ref 0.0–0.1)
Immature Granulocytes: 0 %
Lymphocytes Absolute: 1.7 10*3/uL (ref 0.7–3.1)
Lymphs: 39 %
MCH: 28.6 pg (ref 26.6–33.0)
MCHC: 32.4 g/dL (ref 31.5–35.7)
MCV: 88 fL (ref 79–97)
Monocytes Absolute: 0.3 10*3/uL (ref 0.1–0.9)
Monocytes: 6 %
Neutrophils Absolute: 2.4 10*3/uL (ref 1.4–7.0)
Neutrophils: 52 %
RBC: 4.16 x10E6/uL (ref 3.77–5.28)
RDW: 12.8 % (ref 11.7–15.4)
WBC: 4.5 10*3/uL (ref 3.4–10.8)

## 2023-05-12 LAB — TSH: TSH: 1.46 u[IU]/mL (ref 0.450–4.500)

## 2023-05-12 LAB — HEMOGLOBIN A1C
Est. average glucose Bld gHb Est-mCnc: 134 mg/dL
Hgb A1c MFr Bld: 6.3 % — ABNORMAL HIGH (ref 4.8–5.6)

## 2023-05-12 LAB — VITAMIN B12: Vitamin B-12: 339 pg/mL (ref 232–1245)

## 2023-05-12 LAB — VITAMIN D 25 HYDROXY (VIT D DEFICIENCY, FRACTURES): Vit D, 25-Hydroxy: 30 ng/mL (ref 30.0–100.0)

## 2023-05-25 DIAGNOSIS — M48062 Spinal stenosis, lumbar region with neurogenic claudication: Secondary | ICD-10-CM | POA: Diagnosis not present

## 2023-05-25 DIAGNOSIS — M1611 Unilateral primary osteoarthritis, right hip: Secondary | ICD-10-CM | POA: Diagnosis not present

## 2023-05-25 DIAGNOSIS — M25551 Pain in right hip: Secondary | ICD-10-CM | POA: Diagnosis not present

## 2023-05-25 DIAGNOSIS — M5136 Other intervertebral disc degeneration, lumbar region: Secondary | ICD-10-CM | POA: Diagnosis not present

## 2023-05-25 DIAGNOSIS — M791 Myalgia, unspecified site: Secondary | ICD-10-CM | POA: Diagnosis not present

## 2023-05-25 DIAGNOSIS — M5416 Radiculopathy, lumbar region: Secondary | ICD-10-CM | POA: Diagnosis not present

## 2023-05-25 DIAGNOSIS — M545 Low back pain, unspecified: Secondary | ICD-10-CM | POA: Diagnosis not present

## 2023-06-01 ENCOUNTER — Other Ambulatory Visit: Payer: Self-pay | Admitting: Cardiovascular Disease

## 2023-06-01 ENCOUNTER — Other Ambulatory Visit: Payer: Self-pay | Admitting: Family

## 2023-06-06 ENCOUNTER — Other Ambulatory Visit: Payer: Self-pay

## 2023-06-06 MED ORDER — ACCU-CHEK SOFTCLIX LANCETS MISC: 3 refills | Status: DC

## 2023-06-13 ENCOUNTER — Other Ambulatory Visit: Payer: Self-pay | Admitting: Family

## 2023-06-26 ENCOUNTER — Other Ambulatory Visit: Payer: Self-pay | Admitting: Cardiovascular Disease

## 2023-06-28 DIAGNOSIS — M25551 Pain in right hip: Secondary | ICD-10-CM | POA: Diagnosis not present

## 2023-06-28 DIAGNOSIS — E119 Type 2 diabetes mellitus without complications: Secondary | ICD-10-CM | POA: Diagnosis not present

## 2023-07-13 ENCOUNTER — Encounter: Payer: Self-pay | Admitting: Family

## 2023-07-17 ENCOUNTER — Telehealth: Payer: Medicare HMO

## 2023-07-21 ENCOUNTER — Other Ambulatory Visit: Payer: Self-pay | Admitting: Cardiovascular Disease

## 2023-07-29 ENCOUNTER — Emergency Department
Admission: EM | Admit: 2023-07-29 | Discharge: 2023-07-29 | Disposition: A | Discharge status: Home / Self Care | Payer: Medicare HMO | Attending: Emergency Medicine | Admitting: Emergency Medicine

## 2023-07-29 ENCOUNTER — Encounter: Payer: Self-pay | Admitting: Emergency Medicine

## 2023-07-29 ENCOUNTER — Other Ambulatory Visit: Payer: Self-pay

## 2023-07-29 ENCOUNTER — Emergency Department: Payer: Medicare HMO

## 2023-07-29 DIAGNOSIS — R0789 Other chest pain: Secondary | ICD-10-CM | POA: Diagnosis not present

## 2023-07-29 DIAGNOSIS — R002 Palpitations: Secondary | ICD-10-CM | POA: Diagnosis not present

## 2023-07-29 DIAGNOSIS — R079 Chest pain, unspecified: Secondary | ICD-10-CM | POA: Diagnosis not present

## 2023-07-29 DIAGNOSIS — Z951 Presence of aortocoronary bypass graft: Secondary | ICD-10-CM | POA: Insufficient documentation

## 2023-07-29 LAB — CBC
HCT: 37.8 % (ref 36.0–46.0)
Hemoglobin: 12.6 g/dL (ref 12.0–15.0)
MCH: 28.5 pg (ref 26.0–34.0)
MCHC: 33.3 g/dL (ref 30.0–36.0)
MCV: 85.5 fL (ref 80.0–100.0)
Platelets: 341 10*3/uL (ref 150–400)
RBC: 4.42 MIL/uL (ref 3.87–5.11)
RDW: 12.6 % (ref 11.5–15.5)
WBC: 5.8 10*3/uL (ref 4.0–10.5)
nRBC: 0 % (ref 0.0–0.2)

## 2023-07-29 LAB — TROPONIN I (HIGH SENSITIVITY)
Troponin I (High Sensitivity): 19 ng/L — ABNORMAL HIGH (ref <18)
Troponin I (High Sensitivity): 18 ng/L — ABNORMAL HIGH (ref <18)

## 2023-07-29 LAB — BASIC METABOLIC PANEL
Anion gap: 9 (ref 5–15)
BUN: 13 mg/dL (ref 8–23)
CO2: 27 mmol/L (ref 22–32)
Calcium: 8.9 mg/dL (ref 8.9–10.3)
Chloride: 97 mmol/L — ABNORMAL LOW (ref 98–111)
Creatinine, Ser: 1.13 mg/dL — ABNORMAL HIGH (ref 0.44–1.00)
GFR, Estimated: 55 mL/min — ABNORMAL LOW (ref >60)
Glucose, Bld: 128 mg/dL — ABNORMAL HIGH (ref 70–99)
Potassium: 3.2 mmol/L — ABNORMAL LOW (ref 3.5–5.1)
Sodium: 133 mmol/L — ABNORMAL LOW (ref 135–145)

## 2023-07-29 NOTE — Discharge Instructions (Signed)
Your workup in the Emergency Department today was reassuring.  We did not find any specific abnormalities.  We recommend you drink plenty of fluids, take your regular medications and/or any new ones prescribed today, and follow up with the doctor(s) listed in these documents as recommended.  Return to the Emergency Department if you develop new or worsening symptoms that concern you.  

## 2023-07-29 NOTE — ED Provider Notes (Signed)
[facility] Provider Note    Event Date/Time   First MD Initiated Contact with Patient 07/29/23 [phone]     (approximate)   History   Palpitations   HPI Kayla Malone is a 62 y.o. female who presents for evaluation of palpitations and high blood pressure.  The patient said that she is just not felt quite right over the course of the evening.  She checked her blood pressure and it was high.  She checked it several more times and it kept going higher.  She says she is compliant with her medications and that recently, in fact, her cardiologist, Dr. Welton Flakes, has cut 2 of her blood pressure medications in half from what she was previously taking.  She states that while her blood pressure was high, she was also feeling some "fluttering" or palpitations in her chest.  She was little bit worried because she has a history of 2 heart attacks and a CABG.  She currently feels better.  She is not short of breath and has had no recent fever, cough, or chills.     Physical Exam   Triage Vital Signs: ED Triage Vitals  Encounter Vitals Group     BP 07/29/23 0403 137/73     Systolic BP Percentile --      Diastolic BP Percentile --      Pulse Rate 07/29/23 0403 64     Resp 07/29/23 0403 17     Temp 07/29/23 0403 98.1 F (36.7 C)     Temp Source 07/29/23 0403 Oral     SpO2 07/29/23 0403 100 %     Weight 07/29/23 0403 61.2 kg (135 lb)     Height 07/29/23 0403 1.6 m (5\' 3" )     Head Circumference --      Peak Flow --      Pain Score 07/29/23 0403 6     Pain Loc --      Pain Education --      Exclude from Growth Chart --     Most recent vital signs: Vitals:   07/29/23 0630 07/29/23 0645  BP:  126/77  Pulse: (!) 52 75  Resp: 15 (!) 25  Temp:  98.2 F (36.8 C)  SpO2: 100% 100%    General: Awake, no distress.  CV:  Good peripheral perfusion.  Borderline bradycardia, regular rhythm, normal heart sounds. Resp:  Normal effort. Speaking easily and comfortably, no  accessory muscle usage nor intercostal retractions.  Lungs are clear to auscultation. Abd:  No distention.  No tenderness to palpation of the abdomen.   ED Results / Procedures / Treatments   Labs (all labs ordered are listed, but only abnormal results are displayed) Labs Reviewed  BASIC METABOLIC PANEL - Abnormal; Notable for the following components:      Result Value   Sodium 133 (*)    Potassium 3.2 (*)    Chloride 97 (*)    Glucose, Bld 128 (*)    Creatinine, Ser 1.13 (*)    GFR, Estimated 55 (*)    All other components within normal limits  TROPONIN I (HIGH SENSITIVITY) - Abnormal; Notable for the following components:   Troponin I (High Sensitivity) 19 (*)    All other components within normal limits  TROPONIN I (HIGH SENSITIVITY) - Abnormal; Notable for the following components:   Troponin I (High Sensitivity) 18 (*)    All other components within normal limits  CBC     EKG  ED ECG REPORT I, Loleta Rose, the attending physician, personally viewed and interpreted this ECG.  Date: 07/29/2023 EKG Time: 4 AM Rate: 57 Rhythm: Borderline bradycardia QRS Axis: normal Intervals: normal ST/T Wave abnormalities: normal Narrative Interpretation: no evidence of acute ischemia    RADIOLOGY I viewed and interpreted the patient's two-view chest x-ray there is no evidence of pneumonia or pneumothorax or other acute abnormality.  I also read the radiologist's report, which confirmed no acute findings.   PROCEDURES:  Critical Care performed: No  Procedures    IMPRESSION / MDM / ASSESSMENT AND PLAN / ED COURSE  I reviewed the triage vital signs and the nursing notes.                              Differential diagnosis includes, but is not limited to, uncontrolled hypertension, nonspecific palpitations, cardiac arrhythmia, ACS, PE, pneumonia.  Patient's presentation is most consistent with acute presentation with potential threat to life or bodily  function.  Labs/studies ordered: 2 view chest x-ray, EKG, CBC, BMP, high-sensitivity troponin x 2  Interventions/Medications given:  Medications - No data to display  (Note:  hospital course my include additional interventions and/or labs/studies not listed above.)   Suspect musculoskeletal strain.  Patient notes that the symptoms started after lifting some boxes and the chest tenderness is somewhat reproducible to the anterior chest wall.  Vital signs stable and within normal limits, no hypoxia.  Her labs and radiology and EKG are reassuring.  Will repeat second high-sensitivity troponin since the first 1 was 18.  However, the patient also believes this is likely musculoskeletal and is very comfortable following up.  Initially I considered hospitalization given her cardiac history, but I think this is an appropriate outcome and she agrees and does not want to stay in the hospital.  The patient is on the cardiac monitor to evaluate for evidence of arrhythmia and/or significant heart rate changes.   Clinical Course as of 07/29/23 0753  Sat Jul 29, 2023  6213 Patient's repeat high-sensitivity troponin is unchanged.  Will discharge as previously recommended for outpatient follow-up.  Gave usual and customary return precautions. [CF]    Clinical Course User Index [CF] Loleta Rose, MD     FINAL CLINICAL IMPRESSION(S) / ED DIAGNOSES   Final diagnoses:  Palpitations  Atypical chest pain     Rx / DC Orders   ED Discharge Orders     None        Note:  This document was prepared using Dragon voice recognition software and may include unintentional dictation errors.   Loleta Rose, MD 07/29/23 [phone]

## 2023-07-29 NOTE — ED Triage Notes (Signed)
Pt presents ambulatory to triage via POV with complaints of Palpitations and HTN that started yesterday evening. Hx of HTN and is compliant with medications. The patient states she had taken 2 Tylenol around 1700 yesterday and had minimal improvement in sx. A&Ox4 at this time. Denies fevers, cough, chills, or SOB.

## 2023-07-29 NOTE — ED Notes (Signed)
 Provided pt with discharge instructions and education. All of pt questions answered. Pt in possession of all belongings. Pt AAOX4 and stable at time of discharge.Pt ambulated w/ steady gait towards ED exit. Pt accompanied by family member.

## 2023-07-29 NOTE — ED Notes (Signed)
ED Provider at bedside. 

## 2023-07-29 NOTE — ED Notes (Deleted)
Repeat troponin sent

## 2023-08-03 ENCOUNTER — Encounter: Payer: Self-pay | Admitting: Cardiovascular Disease

## 2023-08-03 ENCOUNTER — Ambulatory Visit (INDEPENDENT_AMBULATORY_CARE_PROVIDER_SITE_OTHER): Payer: Medicare HMO | Admitting: Cardiovascular Disease

## 2023-08-03 VITALS — BP 148/70 | HR 53 | Ht 63.0 in | Wt 133.6 lb

## 2023-08-03 DIAGNOSIS — M533 Sacrococcygeal disorders, not elsewhere classified: Secondary | ICD-10-CM | POA: Diagnosis not present

## 2023-08-03 DIAGNOSIS — M25551 Pain in right hip: Secondary | ICD-10-CM | POA: Diagnosis not present

## 2023-08-03 DIAGNOSIS — M1611 Unilateral primary osteoarthritis, right hip: Secondary | ICD-10-CM | POA: Diagnosis not present

## 2023-08-03 DIAGNOSIS — I25118 Atherosclerotic heart disease of native coronary artery with other forms of angina pectoris: Secondary | ICD-10-CM

## 2023-08-03 DIAGNOSIS — M545 Low back pain, unspecified: Secondary | ICD-10-CM | POA: Diagnosis not present

## 2023-08-03 DIAGNOSIS — I10 Essential (primary) hypertension: Secondary | ICD-10-CM | POA: Diagnosis not present

## 2023-08-03 DIAGNOSIS — M48062 Spinal stenosis, lumbar region with neurogenic claudication: Secondary | ICD-10-CM | POA: Diagnosis not present

## 2023-08-03 DIAGNOSIS — M5136 Other intervertebral disc degeneration, lumbar region: Secondary | ICD-10-CM | POA: Diagnosis not present

## 2023-08-03 DIAGNOSIS — M791 Myalgia, unspecified site: Secondary | ICD-10-CM | POA: Diagnosis not present

## 2023-08-03 DIAGNOSIS — M5416 Radiculopathy, lumbar region: Secondary | ICD-10-CM | POA: Diagnosis not present

## 2023-08-03 DIAGNOSIS — R001 Bradycardia, unspecified: Secondary | ICD-10-CM | POA: Diagnosis not present

## 2023-08-03 DIAGNOSIS — T82857D Stenosis of cardiac prosthetic devices, implants and grafts, subsequent encounter: Secondary | ICD-10-CM

## 2023-08-03 DIAGNOSIS — E782 Mixed hyperlipidemia: Secondary | ICD-10-CM

## 2023-08-03 MED ORDER — METOPROLOL SUCCINATE ER 25 MG PO TB24: 25.0000 mg | ORAL_TABLET | Freq: Every day | ORAL | 11 refills | Status: DC

## 2023-08-03 NOTE — Assessment & Plan Note (Signed)
Patient denies any chest pain and shortness of breath.  Heart rate was low in the 40s and low 50s thus we will decrease the metoprolol to 25 mg once a day.  Blood pressure repeat is not elevated this 120/70.

## 2023-08-03 NOTE — Progress Notes (Signed)
Cardiology Office Note   Date:  08/03/2023   ID:  Kayla Malone, DOB 1961/05/27, MRN [id number]  PCP:  Miki Kins, FNP  Cardiologist:  Adrian Blackwater, MD      History of Present Illness: Kayla Malone is a 62 y.o. female who presents for  Chief Complaint  Patient presents with   Follow-up    HR was low, was in ER      Past Medical History:  Diagnosis Date   Adenomatous colon polyp 02/20/2014   Asthma    Benign neoplasm of colon 02/20/2014   Chest pain 09/13/2013   Coronary angioplasty status 11/13/2018   Coronary artery disease    Depression    Diabetes mellitus without complication (HCC)    GERD (gastroesophageal reflux disease)    History of hiatal hernia    Hypertension    Hypokalemia 05/29/2019   Hypomagnesemia 07/02/2022   Nausea with vomiting, unspecified 06/07/2019   NSTEMI (non-ST elevated myocardial infarction) (HCC) 09/23/2018   Pain    BACK   Stroke (HCC)    x3   Thyroid cyst    TIA (transient ischemic attack) 08/06/2017   Unstable angina (HCC) 10/20/2017   Weight loss 06/07/2019     Past Surgical History:  Procedure Laterality Date   ABDOMINAL HYSTERECTOMY     CARDIAC SURGERY     CATARACT EXTRACTION W/PHACO Left 08/15/2017   Procedure: CATARACT EXTRACTION PHACO AND INTRAOCULAR LENS PLACEMENT (IOC);  Surgeon: Galen Manila, MD;  Location: [facility] ORS;  Service: Ophthalmology;  Laterality: Left;  [place] 00:40.6 AP% 7.3 CDE 2.98 FLUID PACK LOT # [id number] h   CORONARY ARTERY BYPASS GRAFT N/A 10/23/2017   Procedure: CORONARY ARTERY BYPASS GRAFTING (CABG) x two  , using left internal mammary artery and right leg greater saphenous vein harvested  endoscopically - LIMA to LAD, SVG to RCA;  Surgeon: Delight Ovens, MD;  Location: [facility] OR;  Service: Open Heart Surgery;  Laterality: N/A;   CORONARY STENT INTERVENTION N/A 09/24/2018   Procedure: CORONARY STENT INTERVENTION;  Surgeon: Corky Crafts, MD;  Location: [facility] INVASIVE CV  LAB;  Service: Cardiovascular;  Laterality: N/A;   ESOPHAGOGASTRODUODENOSCOPY N/A 05/30/2019   Procedure: ESOPHAGOGASTRODUODENOSCOPY (EGD);  Surgeon: Toledo, Boykin Nearing, MD;  Location: [facility] ENDOSCOPY;  Service: Gastroenterology;  Laterality: N/A;   EYE SURGERY     LEFT HEART CATH AND CORONARY ANGIOGRAPHY Left 10/09/2017   Procedure: LEFT HEART CATH AND CORONARY ANGIOGRAPHY;  Surgeon: Laurier Nancy, MD;  Location: [facility] INVASIVE CV LAB;  Service: Cardiovascular;  Laterality: Left;   LEFT HEART CATH AND CORS/GRAFTS ANGIOGRAPHY N/A 09/24/2018   Procedure: LEFT HEART CATH AND CORS/GRAFTS ANGIOGRAPHY;  Surgeon: Corky Crafts, MD;  Location: [facility] INVASIVE CV LAB;  Service: Cardiovascular;  Laterality: N/A;   LEFT HEART CATH AND CORS/GRAFTS ANGIOGRAPHY N/A 07/04/2022   Procedure: LEFT HEART CATH AND CORS/GRAFTS ANGIOGRAPHY;  Surgeon: Laurier Nancy, MD;  Location: [facility] INVASIVE CV LAB;  Service: Cardiovascular;  Laterality: N/A;   TEE WITHOUT CARDIOVERSION N/A 10/23/2017   Procedure: TRANSESOPHAGEAL ECHOCARDIOGRAM (TEE);  Surgeon: Delight Ovens, MD;  Location: [facility] OR;  Service: Open Heart Surgery;  Laterality: N/A;     Current Outpatient Medications  Medication Sig Dispense Refill   metoprolol succinate (TOPROL XL) 25 MG 24 hr tablet Take 1 tablet (25 mg total) by mouth daily. 30 tablet 11   Accu-Chek Softclix Lancets lancets Use to test sugars twice daily 100 each 3   acetaminophen (TYLENOL) 500 MG tablet  Take 1,000 mg by mouth every 6 (six) hours as needed for mild pain or headache.     aspirin EC 81 MG tablet Take 81 mg by mouth daily.     atorvastatin (LIPITOR) 80 MG tablet TAKE 1 TABLET EVERY DAY 90 tablet 3   Blood Glucose Monitoring Suppl (ACCU-CHEK GUIDE ME) w/Device KIT      chlorthalidone (HYGROTON) 25 MG tablet TAKE 1 TABLET EVERY DAY 90 tablet 3   clopidogrel (PLAVIX) 75 MG tablet TAKE 1 TABLET EVERY DAY 90 tablet 3   fluticasone (FLONASE) 50 MCG/ACT nasal spray Place 2 sprays  into both nostrils daily.     glucose blood (ACCU-CHEK GUIDE) test strip Blood Glucose Test In Vitro Strip QTY: 100 strip Days: 90 Refills: 3  Written: 06/07/19 Patient Instructions: E11.9 - to check blodd sugars once daily     isosorbide mononitrate (IMDUR) 30 MG 24 hr tablet Take 30 mg by mouth daily.     losartan (COZAAR) 25 MG tablet TAKE 1 TABLET (25 MG TOTAL) BY MOUTH DAILY. 90 tablet 2   methocarbamol (ROBAXIN) 500 MG tablet Take 500 mg by mouth at bedtime as needed for muscle spasms.     nitroGLYCERIN (NITROSTAT) 0.4 MG SL tablet Place 1 tablet (0.4 mg total) under the tongue every 5 (five) minutes x 3 doses as needed for chest pain. 25 tablet 0   ondansetron (ZOFRAN-ODT) 4 MG disintegrating tablet Take 4 mg by mouth every 8 (eight) hours as needed for nausea or vomiting.     pantoprazole (PROTONIX) 40 MG tablet Take 40 mg by mouth.      potassium chloride SA (KLOR-CON M) 20 MEQ tablet Take 20 mEq by mouth daily.     ranolazine (RANEXA) 500 MG 12 hr tablet TAKE 1 TABLET TWICE DAILY 180 tablet 3   RYBELSUS 3 MG TABS TAKE 1 TABLET EVERY DAY 90 tablet 3   Vitamin D, Ergocalciferol, (DRISDOL) 1.25 MG (50000 UNIT) CAPS capsule Take 1 capsule (50,000 Units total) by mouth every 7 (seven) days. 12 capsule 1   No current facility-administered medications for this visit.    Allergies:   Lisinopril, Meloxicam, Tramadol, Amoxicillin, Amoxicillin-pot clavulanate, and Clavulanic acid    Social History:   reports that she quit smoking about 24 years ago. Her smoking use included cigarettes. She has never used smokeless tobacco. She reports that she does not drink alcohol and does not use drugs.   Family History:  family history includes COPD in her brother; Cirrhosis in her father; Coronary artery disease in her brother, father, and sister; Diabetes type II in her father; Heart failure in her mother; Hypertension in her father; Kidney disease in her father; Pulmonary embolism in her mother; Stroke in  her sister.    ROS:     Review of Systems  Constitutional: Negative.   HENT: Negative.    Eyes: Negative.   Respiratory: Negative.    Gastrointestinal: Negative.   Genitourinary: Negative.   Musculoskeletal: Negative.   Skin: Negative.   Neurological: Negative.   Endo/Heme/Allergies: Negative.   Psychiatric/Behavioral: Negative.    All other systems reviewed and are negative.     All other systems are reviewed and negative.    PHYSICAL EXAM: VS:  BP (!) 148/70   Pulse (!) 53   Ht 5\' 3"  (1.6 m)   Wt 133 lb 9.6 oz (60.6 kg)   SpO2 99%   BMI 23.67 kg/m  , BMI Body mass index is 23.67 kg/m. Last weight:  Wt Readings from Last 3 Encounters:  08/03/23 133 lb 9.6 oz (60.6 kg)  07/29/23 135 lb (61.2 kg)  05/11/23 138 lb 9.6 oz (62.9 kg)     Physical Exam Constitutional:      Appearance: Normal appearance.  Cardiovascular:     Rate and Rhythm: Normal rate and regular rhythm.     Heart sounds: Normal heart sounds.  Pulmonary:     Effort: Pulmonary effort is normal.     Breath sounds: Normal breath sounds.  Musculoskeletal:     Right lower leg: No edema.     Left lower leg: No edema.  Neurological:     Mental Status: She is alert.       EKG:   Recent Labs: 02/18/2023: B Natriuretic Peptide 16.0; Magnesium 1.7 05/11/2023: ALT 7; TSH 1.460 07/29/2023: BUN 13; Creatinine, Ser 1.13; Hemoglobin 12.6; Platelets 341; Potassium 3.2; Sodium 133    Lipid Panel    Component Value Date/Time   CHOL 138 05/11/2023 1536   TRIG 55 05/11/2023 1536   HDL 49 05/11/2023 1536   CHOLHDL 2.8 05/11/2023 1536   CHOLHDL 4.0 07/04/2022 0623   VLDL 14 07/04/2022 0623   LDLCALC 77 05/11/2023 1536      Other studies Reviewed: Additional studies/ records that were reviewed today include:  Review of the above records demonstrates:       No data to display            ASSESSMENT AND PLAN:    ICD-10-CM   1. Benign essential hypertension  I10     2. Coronary artery  disease of native artery of native heart with stable angina pectoris (HCC)  I25.118     3. Coronary graft stenosis, subsequent encounter  T82.857D     4. Bradycardia  R00.1    Will decrease metoprolol to 25 mg once a day.    5. Mixed hyperlipidemia  E78.2     6. Primary hypertension  I10    Repeat blood pressure was 120/70       Problem List Items Addressed This Visit       Cardiovascular and Mediastinum   Benign essential hypertension - Primary   Relevant Medications   metoprolol succinate (TOPROL XL) 25 MG 24 hr tablet   Coronary artery disease of native artery of native heart with stable angina pectoris (HCC)   Relevant Medications   metoprolol succinate (TOPROL XL) 25 MG 24 hr tablet   Coronary graft stenosis    Patient denies any chest pain and shortness of breath.  Heart rate was low in the 40s and low 50s thus we will decrease the metoprolol to 25 mg once a day.  Blood pressure repeat is not elevated this 120/70.      Relevant Medications   metoprolol succinate (TOPROL XL) 25 MG 24 hr tablet     Other   Mixed hyperlipidemia   Relevant Medications   metoprolol succinate (TOPROL XL) 25 MG 24 hr tablet   Bradycardia   Other Visit Diagnoses     Primary hypertension       Repeat blood pressure was 120/70   Relevant Medications   metoprolol succinate (TOPROL XL) 25 MG 24 hr tablet          Disposition:   Return in about 4 weeks (around 08/31/2023).    Total time spent: 45 minutes  Signed,  Adrian Blackwater, MD  08/03/2023 11:32 AM    [facility]

## 2023-08-04 ENCOUNTER — Telehealth: Payer: Self-pay

## 2023-08-04 NOTE — Telephone Encounter (Signed)
Transition Care Management Follow-up Telephone Call Date of discharge and from where: 07/29/2023 [facility] How have you been since you were released from the hospital? Patient stated she is feeling better, but still has some chest discomfort. Any questions or concerns? No  Items Reviewed: Did the pt receive and understand the discharge instructions provided? Yes  Medications obtained and verified?  No medication prescribed. Other? No  Any new allergies since your discharge? No  Dietary orders reviewed? Yes Do you have support at home? Yes   Follow up appointments reviewed:  PCP Hospital f/u appt confirmed? Yes  Scheduled to see Maryanna Shape. Talbert Forest, FNP on 08/15/2023 @ [facility]. Specialist Hospital f/u appt confirmed? No  Scheduled to see  on  @ . Are transportation arrangements needed? No  If their condition worsens, is the pt aware to call PCP or go to the Emergency Dept.? Yes Was the patient provided with contact information for the PCP's office or ED? Yes Was to pt encouraged to call back with questions or concerns? Yes  Adiya Selmer [facility]  [facility] Population Health Community Resource Care Guide   [email or web address]  ?? [phone]   Website: [email or web address]  [email or web address]

## 2023-08-11 ENCOUNTER — Ambulatory Visit: Payer: Medicare HMO | Admitting: Cardiovascular Disease

## 2023-08-15 ENCOUNTER — Ambulatory Visit (INDEPENDENT_AMBULATORY_CARE_PROVIDER_SITE_OTHER): Payer: Medicare HMO | Admitting: Family

## 2023-08-15 ENCOUNTER — Encounter: Payer: Self-pay | Admitting: Family

## 2023-08-15 VITALS — BP 122/61 | HR 56 | Ht 63.0 in | Wt 134.8 lb

## 2023-08-15 DIAGNOSIS — E782 Mixed hyperlipidemia: Secondary | ICD-10-CM | POA: Diagnosis not present

## 2023-08-15 DIAGNOSIS — E559 Vitamin D deficiency, unspecified: Secondary | ICD-10-CM | POA: Diagnosis not present

## 2023-08-15 DIAGNOSIS — K279 Peptic ulcer, site unspecified, unspecified as acute or chronic, without hemorrhage or perforation: Secondary | ICD-10-CM | POA: Diagnosis not present

## 2023-08-15 DIAGNOSIS — E039 Hypothyroidism, unspecified: Secondary | ICD-10-CM | POA: Diagnosis not present

## 2023-08-15 DIAGNOSIS — E1165 Type 2 diabetes mellitus with hyperglycemia: Secondary | ICD-10-CM

## 2023-08-15 DIAGNOSIS — K219 Gastro-esophageal reflux disease without esophagitis: Secondary | ICD-10-CM

## 2023-08-15 DIAGNOSIS — R200 Anesthesia of skin: Secondary | ICD-10-CM

## 2023-08-15 DIAGNOSIS — I10 Essential (primary) hypertension: Secondary | ICD-10-CM | POA: Diagnosis not present

## 2023-08-15 DIAGNOSIS — E538 Deficiency of other specified B group vitamins: Secondary | ICD-10-CM

## 2023-08-15 DIAGNOSIS — R202 Paresthesia of skin: Secondary | ICD-10-CM

## 2023-08-15 NOTE — Progress Notes (Signed)
Established Patient Office Visit  Subjective:  Patient ID: Kayla Malone, female    DOB: March 28, 1961  Age: 62 y.o. MRN: [id number]  Chief Complaint  Patient presents with   Follow-up    Patient is here today for her 3 months follow up.  She has been feeling fairly well since last appointment.   She does have additional concerns to discuss today.  Feet are numb.  Asks for referral to be sent regarding this.  Also says that her heart rate is still low, Dr. Welton Flakes is still adjusting her heart medications.   Labs are due today. She needs refills.   I have reviewed her active problem list, medication list, allergies, notes from last encounter, lab results for her appointment today.   No other concerns at this time.   Past Medical History:  Diagnosis Date   Adenomatous colon polyp 02/20/2014   Asthma    Benign neoplasm of colon 02/20/2014   Chest pain 09/13/2013   Coronary angioplasty status 11/13/2018   Coronary artery disease    Depression    Diabetes mellitus without complication (HCC)    GERD (gastroesophageal reflux disease)    History of hiatal hernia    Hypertension    Hypokalemia 05/29/2019   Hypomagnesemia 07/02/2022   Nausea with vomiting, unspecified 06/07/2019   NSTEMI (non-ST elevated myocardial infarction) (HCC) 09/23/2018   Pain    BACK   Stroke (HCC)    x3   Thyroid cyst    TIA (transient ischemic attack) 08/06/2017   Unstable angina (HCC) 10/20/2017   Weight loss 06/07/2019    Past Surgical History:  Procedure Laterality Date   ABDOMINAL HYSTERECTOMY     CARDIAC SURGERY     CATARACT EXTRACTION W/PHACO Left 08/15/2017   Procedure: CATARACT EXTRACTION PHACO AND INTRAOCULAR LENS PLACEMENT (IOC);  Surgeon: Galen Manila, MD;  Location: [facility] ORS;  Service: Ophthalmology;  Laterality: Left;  [place] 00:40.6 AP% 7.3 CDE 2.98 FLUID PACK LOT # [id number] h   CORONARY ARTERY BYPASS GRAFT N/A 10/23/2017   Procedure: CORONARY ARTERY BYPASS GRAFTING (CABG) x  two  , using left internal mammary artery and right leg greater saphenous vein harvested  endoscopically - LIMA to LAD, SVG to RCA;  Surgeon: Delight Ovens, MD;  Location: [facility] OR;  Service: Open Heart Surgery;  Laterality: N/A;   CORONARY STENT INTERVENTION N/A 09/24/2018   Procedure: CORONARY STENT INTERVENTION;  Surgeon: Corky Crafts, MD;  Location[facility] INVASIVE CV LAB;  Service: Cardiovascular;  Laterality: N/A;   ESOPHAGOGASTRODUODENOSCOPY N/A 05/30/2019   Procedure: ESOPHAGOGASTRODUODENOSCOPY (EGD);  Surgeon: Toledo, Boykin Nearing, MD;  Location: [facility] ENDOSCOPY;  Service: Gastroenterology;  Laterality: N/A;   EYE SURGERY     LEFT HEART CATH AND CORONARY ANGIOGRAPHY Left 10/09/2017   Procedure: LEFT HEART CATH AND CORONARY ANGIOGRAPHY;  Surgeon: Laurier Nancy, MD;  Location: [facility] INVASIVE CV LAB;  Service: Cardiovascular;  Laterality: Left;   LEFT HEART CATH AND CORS/GRAFTS ANGIOGRAPHY N/A 09/24/2018   Procedure: LEFT HEART CATH AND CORS/GRAFTS ANGIOGRAPHY;  Surgeon: Corky Crafts, MD;  Location: [facility] INVASIVE CV LAB;  Service: Cardiovascular;  Laterality: N/A;   LEFT HEART CATH AND CORS/GRAFTS ANGIOGRAPHY N/A 07/04/2022   Procedure: LEFT HEART CATH AND CORS/GRAFTS ANGIOGRAPHY;  Surgeon: Laurier Nancy, MD;  Location: [facility] INVASIVE CV LAB;  Service: Cardiovascular;  Laterality: N/A;   TEE WITHOUT CARDIOVERSION N/A 10/23/2017   Procedure: TRANSESOPHAGEAL ECHOCARDIOGRAM (TEE);  Surgeon: Delight Ovens, MD;  Location: [facility] OR;  Service: Open Heart  Surgery;  Laterality: N/A;    Social History   Socioeconomic History   Marital status: Married    Spouse name: Not on file   Number of children: Not on file   Years of education: Not on file   Highest education level: Not on file  Occupational History   Not on file  Tobacco Use   Smoking status: Former    Current packs/day: 0.00    Types: Cigarettes    Quit date: 09/13/1998    Years since quitting: 24.9   Smokeless tobacco:  Never  Vaping Use   Vaping status: Never Used  Substance and Sexual Activity   Alcohol use: No   Drug use: No   Sexual activity: Yes    Birth control/protection: Post-menopausal  Other Topics Concern   Not on file  Social History Narrative   Not on file   Social Determinants of Health   Financial Resource Strain: Not on file  Food Insecurity: No Food Insecurity (10/11/2019)   Received from [facility], [facility]   Hunger Vital Sign    Worried About Running Out of Food in the Last Year: Never true    Ran Out of Food in the Last Year: Never true  Transportation Needs: Not on file  Physical Activity: Not on file  Stress: Not on file  Social Connections: Not on file  Intimate Partner Violence: Not on file    Family History  Problem Relation Age of Onset   Heart failure Mother    Pulmonary embolism Mother    Cirrhosis Father    Coronary artery disease Father    Diabetes type II Father    Hypertension Father    Kidney disease Father    Coronary artery disease Sister    Stroke Sister    COPD Brother    Coronary artery disease Brother    Breast cancer Neg Hx     Allergies  Allergen Reactions   Lisinopril Swelling and Shortness Of Breath   Meloxicam Nausea Only and Nausea And Vomiting   Tramadol Nausea Only and Other (See Comments)    sleepiness  Other reaction(s): Dizziness, Other, Other (See Comments)  somnolence  Other reaction(s): Other (See Comments), Other (See Comments)  Would not wake up after taking this medication   Amoxicillin Rash    Don't remember  Don't remember  Don't remember    Amoxicillin-Pot Clavulanate Hives and Rash   Clavulanic Acid Rash    Review of Systems  Cardiovascular:  Positive for palpitations.  All other systems reviewed and are negative.      Objective:   BP 122/61   Pulse (!) 56   Ht 5\' 3"  (1.6 m)   Wt 134 lb 12.8 oz (61.1 kg)   SpO2 99%   BMI 23.88 kg/m   Vitals:   08/15/23 0907  BP: 122/61   Pulse: (!) 56  Height: 5\' 3"  (1.6 m)  Weight: 134 lb 12.8 oz (61.1 kg)  SpO2: 99%  BMI (Calculated): 23.88    Physical Exam   No results found for any visits on 08/15/23.  Recent Results (from the past 2160 hour(s))  Basic metabolic panel     Status: Abnormal   Collection Time: 07/29/23  4:03 AM  Result Value Ref Range   Sodium 133 (L) 135 - 145 mmol/L   Potassium 3.2 (L) 3.5 - 5.1 mmol/L   Chloride 97 (L) 98 - 111 mmol/L   CO2 27 22 - 32 mmol/L   Glucose,  Bld 128 (H) 70 - 99 mg/dL    Comment: Glucose reference range applies only to samples taken after fasting for at least 8 hours.   BUN 13 8 - 23 mg/dL   Creatinine, Ser 1.61 (H) 0.44 - 1.00 mg/dL   Calcium 8.9 8.9 - 09.6 mg/dL   GFR, Estimated 55 (L) >60 mL/min    Comment: (NOTE) Calculated using the CKD-EPI Creatinine Equation (2021)    Anion gap 9 5 - 15    Comment: Performed at [facility], [address] [place], [place] [address]  CBC     Status: None   Collection Time: 07/29/23  4:03 AM  Result Value Ref Range   WBC 5.8 4.0 - 10.5 K/uL   RBC 4.42 3.87 - 5.11 MIL/uL   Hemoglobin 12.6 12.0 - 15.0 g/dL   HCT 98.1 19.1 - 47.8 %   MCV 85.5 80.0 - 100.0 fL   MCH 28.5 26.0 - 34.0 pg   MCHC 33.3 30.0 - 36.0 g/dL   RDW 29.5 62.1 - 30.8 %   Platelets 341 150 - 400 K/uL   nRBC 0.0 0.0 - 0.2 %    Comment: Performed at [facility], [address], [place], [place] [address]  Troponin I (High Sensitivity)     Status: Abnormal   Collection Time: 07/29/23  4:03 AM  Result Value Ref Range   Troponin I (High Sensitivity) 19 (H) <18 ng/L    Comment: (NOTE) Elevated high sensitivity troponin I (hsTnI) values and significant  changes across serial measurements may suggest ACS but many other  chronic and acute conditions are known to elevate hsTnI results.  Refer to the "Links" section for chest pain algorithms and additional  guidance. Performed at [facility], [address]  [place], [place] [address]   Troponin I (High Sensitivity)     Status: Abnormal   Collection Time: 07/29/23  5:56 AM  Result Value Ref Range   Troponin I (High Sensitivity) 18 (H) <18 ng/L    Comment: (NOTE) Elevated high sensitivity troponin I (hsTnI) values and significant  changes across serial measurements may suggest ACS but many other  chronic and acute conditions are known to elevate hsTnI results.  Refer to the "Links" section for chest pain algorithms and additional  guidance. Performed at [facility], [address], [place], [place] [address]        Assessment & Plan:   Problem List Items Addressed This Visit       Active Problems   Mixed hyperlipidemia    Checking labs today.  Continue current therapy for lipid control. Will modify as needed based on labwork results.       Relevant Orders   Lipid panel   CMP14+EGFR   CBC with Differential/Platelet   GERD (gastroesophageal reflux disease)    Resending referral to GI  for recheck of her stomach issues.       Relevant Orders   Ambulatory referral to Gastroenterology   CMP14+EGFR   CBC with Differential/Platelet   Peptic ulcer disease   Relevant Orders   Ambulatory referral to Gastroenterology   CMP14+EGFR   CBC with Differential/Platelet   Benign essential hypertension    Blood pressure well controlled with current medications.  Continue current therapy.  Will reassess at follow up.       Relevant Orders   CMP14+EGFR   CBC with Differential/Platelet   Type 2 diabetes mellitus with hyperglycemia (HCC)    Checking labs today.  Will call pt. With results  Continue current diabetes POC, as patient has been well controlled on current regimen.  Will adjust meds if needed based on labs.       Relevant Orders   CMP14+EGFR   Hemoglobin A1c   CBC with Differential/Platelet   Vitamin D deficiency, unspecified    Checking labs today.  Will continue supplements as needed.       Relevant Orders    VITAMIN D 25 Hydroxy (Vit-D Deficiency, Fractures)   CMP14+EGFR   CBC with Differential/Platelet   Other Visit Diagnoses     Numbness and tingling of foot    -  Primary   Setting up referral to podiatry.   Relevant Orders   Ambulatory referral to Podiatry   CMP14+EGFR   CBC with Differential/Platelet   B12 deficiency due to diet       Checking labs today.  Continue supplements as needed.   Relevant Orders   CMP14+EGFR   Vitamin B12   CBC with Differential/Platelet   Hypothyroidism (acquired)       Relevant Orders   CMP14+EGFR   TSH   CBC with Differential/Platelet       Return in about 3 months (around 11/14/2023) for F/U.   Total time spent: 30 minutes  Miki Kins, FNP  08/15/2023   This document may have been prepared by [facility] Voice Recognition software and as such may include unintentional dictation errors.

## 2023-08-15 NOTE — Assessment & Plan Note (Signed)
Resending referral to GI  for recheck of her stomach issues.

## 2023-08-15 NOTE — Assessment & Plan Note (Signed)
Blood pressure well controlled with current medications.  Continue current therapy.  Will reassess at follow up.  

## 2023-08-15 NOTE — Assessment & Plan Note (Signed)
Checking labs today.  Will continue supplements as needed.  

## 2023-08-15 NOTE — Assessment & Plan Note (Signed)
Checking labs today. Will call pt. With results  Continue current diabetes POC, as patient has been well controlled on current regimen.  Will adjust meds if needed based on labs.  

## 2023-08-15 NOTE — Assessment & Plan Note (Signed)
Checking labs today.  Continue current therapy for lipid control. Will modify as needed based on labwork results.  

## 2023-08-16 LAB — CMP14+EGFR
ALT: 12 IU/L (ref 0–32)
AST: 13 IU/L (ref 0–40)
Albumin: 4.2 g/dL (ref 3.9–4.9)
Alkaline Phosphatase: 94 IU/L (ref 44–121)
BUN/Creatinine Ratio: 10 — ABNORMAL LOW (ref 12–28)
BUN: 11 mg/dL (ref 8–27)
Bilirubin Total: 0.4 mg/dL (ref 0.0–1.2)
CO2: 28 mmol/L (ref 20–29)
Calcium: 9.5 mg/dL (ref 8.7–10.3)
Chloride: 98 mmol/L (ref 96–106)
Creatinine, Ser: 1.09 mg/dL — ABNORMAL HIGH (ref 0.57–1.00)
Globulin, Total: 2.5 g/dL (ref 1.5–4.5)
Glucose: 122 mg/dL — ABNORMAL HIGH (ref 70–99)
Potassium: 3.3 mmol/L — ABNORMAL LOW (ref 3.5–5.2)
Sodium: 140 mmol/L (ref 134–144)
Total Protein: 6.7 g/dL (ref 6.0–8.5)
eGFR: 57 mL/min/{1.73_m2} — ABNORMAL LOW (ref >59)

## 2023-08-16 LAB — LIPID PANEL
Chol/HDL Ratio: 3 ratio (ref 0.0–4.4)
Cholesterol, Total: 152 mg/dL (ref 100–199)
HDL: 50 mg/dL (ref >39)
LDL Chol Calc (NIH): 88 mg/dL (ref 0–99)
Triglycerides: 73 mg/dL (ref 0–149)
VLDL Cholesterol Cal: 14 mg/dL (ref 5–40)

## 2023-08-16 LAB — VITAMIN B12: Vitamin B-12: 353 pg/mL (ref 232–1245)

## 2023-08-16 LAB — HEMOGLOBIN A1C
Est. average glucose Bld gHb Est-mCnc: 146 mg/dL
Hgb A1c MFr Bld: 6.7 % — ABNORMAL HIGH (ref 4.8–5.6)

## 2023-08-16 LAB — TSH: TSH: 1.26 u[IU]/mL (ref 0.450–4.500)

## 2023-08-16 LAB — VITAMIN D 25 HYDROXY (VIT D DEFICIENCY, FRACTURES): Vit D, 25-Hydroxy: 26.8 ng/mL — ABNORMAL LOW (ref 30.0–100.0)

## 2023-08-25 DIAGNOSIS — M461 Sacroiliitis, not elsewhere classified: Secondary | ICD-10-CM | POA: Diagnosis not present

## 2023-08-25 DIAGNOSIS — E119 Type 2 diabetes mellitus without complications: Secondary | ICD-10-CM | POA: Diagnosis not present

## 2023-08-29 DIAGNOSIS — M25551 Pain in right hip: Secondary | ICD-10-CM | POA: Diagnosis not present

## 2023-08-29 DIAGNOSIS — S29012A Strain of muscle and tendon of back wall of thorax, initial encounter: Secondary | ICD-10-CM | POA: Diagnosis not present

## 2023-09-04 ENCOUNTER — Ambulatory Visit (INDEPENDENT_AMBULATORY_CARE_PROVIDER_SITE_OTHER): Payer: Medicare HMO | Admitting: Cardiovascular Disease

## 2023-09-04 ENCOUNTER — Ambulatory Visit: Payer: Medicare HMO | Admitting: Cardiovascular Disease

## 2023-09-04 ENCOUNTER — Encounter: Payer: Self-pay | Admitting: Cardiovascular Disease

## 2023-09-04 ENCOUNTER — Other Ambulatory Visit: Payer: Self-pay

## 2023-09-04 VITALS — BP 143/71 | HR 62 | Ht 63.0 in | Wt 129.4 lb

## 2023-09-04 DIAGNOSIS — I10 Essential (primary) hypertension: Secondary | ICD-10-CM

## 2023-09-04 DIAGNOSIS — K219 Gastro-esophageal reflux disease without esophagitis: Secondary | ICD-10-CM

## 2023-09-04 DIAGNOSIS — I739 Peripheral vascular disease, unspecified: Secondary | ICD-10-CM | POA: Diagnosis not present

## 2023-09-04 DIAGNOSIS — E782 Mixed hyperlipidemia: Secondary | ICD-10-CM

## 2023-09-04 DIAGNOSIS — T82857D Stenosis of cardiac prosthetic devices, implants and grafts, subsequent encounter: Secondary | ICD-10-CM

## 2023-09-04 DIAGNOSIS — R002 Palpitations: Secondary | ICD-10-CM

## 2023-09-04 MED ORDER — SUCRALFATE 1 G PO TABS
1.0000 g | ORAL_TABLET | Freq: Four times a day (QID) | ORAL | 1 refills | Status: DC
Start: 2023-09-04 — End: 2023-09-28

## 2023-09-04 NOTE — Progress Notes (Signed)
Cardiology Office Note   Date:  09/04/2023   ID:  [name], DOB 1961/01/15, MRN [id number]  PCP:  Miki Kins, FNP  Cardiologist:  Adrian Blackwater, MD      History of Present Illness: Kayla Malone is a 62 y.o. female who presents for  Chief Complaint  Patient presents with  . Follow-up    Food stuck, in chest   Palpitations  This is a new problem. The current episode started 1 to 4 weeks ago. The problem has been waxing and waning. Associated symptoms include diaphoresis, nausea, shortness of breath and vomiting. Pertinent negatives include no chest pain.      Past Medical History:  Diagnosis Date  . Adenomatous colon polyp 02/20/2014  . Asthma   . Benign neoplasm of colon 02/20/2014  . Chest pain 09/13/2013  . Coronary angioplasty status 11/13/2018  . Coronary artery disease   . Depression   . Diabetes mellitus without complication (HCC)   . GERD (gastroesophageal reflux disease)   . History of hiatal hernia   . Hypertension   . Hypokalemia 05/29/2019  . Hypomagnesemia 07/02/2022  . Nausea with vomiting, unspecified 06/07/2019  . NSTEMI (non-ST elevated myocardial infarction) (HCC) 09/23/2018  . Pain    BACK  . Stroke (HCC)    x3  . Thyroid cyst   . TIA (transient ischemic attack) 08/06/2017  . Unstable angina (HCC) 10/20/2017  . Weight loss 06/07/2019     Past Surgical History:  Procedure Laterality Date  . ABDOMINAL HYSTERECTOMY    . CARDIAC SURGERY    . CATARACT EXTRACTION W/PHACO Left 08/15/2017   Procedure: CATARACT EXTRACTION PHACO AND INTRAOCULAR LENS PLACEMENT (IOC);  Surgeon: Galen Manila, MD;  Location: [facility] ORS;  Service: Ophthalmology;  Laterality: Left;  [place] 00:40.6 AP% 7.3 CDE 2.98 FLUID PACK LOT # [id number]  . CORONARY ARTERY BYPASS GRAFT N/A 10/23/2017   Procedure: CORONARY ARTERY BYPASS GRAFTING (CABG) x two  , using left internal mammary artery and right leg greater saphenous vein harvested  endoscopically -  LIMA to LAD, SVG to RCA;  Surgeon: Delight Ovens, MD;  Location: [facility] OR;  Service: Open Heart Surgery;  Laterality: N/A;  . CORONARY STENT INTERVENTION N/A 09/24/2018   Procedure: CORONARY STENT INTERVENTION;  Surgeon: Corky Crafts, MD;  Location: [facility];  Service: Cardiovascular;  Laterality: N/A;  . ESOPHAGOGASTRODUODENOSCOPY N/A 05/30/2019   Procedure: ESOPHAGOGASTRODUODENOSCOPY (EGD);  Surgeon: Toledo, Boykin Nearing, MD;  Location: [facility] ENDOSCOPY;  Service: Gastroenterology;  Laterality: N/A;  . EYE SURGERY    . LEFT HEART CATH AND CORONARY ANGIOGRAPHY Left 10/09/2017   Procedure: LEFT HEART CATH AND CORONARY ANGIOGRAPHY;  Surgeon: Laurier Nancy, MD;  Location: [facility] INVASIVE CV LAB;  Service: Cardiovascular;  Laterality: Left;  . LEFT HEART CATH AND CORS/GRAFTS ANGIOGRAPHY N/A 09/24/2018   Procedure: LEFT HEART CATH AND CORS/GRAFTS ANGIOGRAPHY;  Surgeon: Corky Crafts, MD;  Location: [facility] INVASIVE CV LAB;  Service: Cardiovascular;  Laterality: N/A;  . LEFT HEART CATH AND CORS/GRAFTS ANGIOGRAPHY N/A 07/04/2022   Procedure: LEFT HEART CATH AND CORS/GRAFTS ANGIOGRAPHY;  Surgeon: Laurier Nancy, MD;  Location: [facility] INVASIVE CV LAB;  Service: Cardiovascular;  Laterality: N/A;  . TEE WITHOUT CARDIOVERSION N/A 10/23/2017   Procedure: TRANSESOPHAGEAL ECHOCARDIOGRAM (TEE);  Surgeon: Delight Ovens, MD;  Location: [facility] OR;  Service: Open Heart Surgery;  Laterality: N/A;     Current Outpatient Medications  Medication Sig Dispense Refill  . sucralfate (CARAFATE) 1 g  tablet Take 1 tablet (1 g total) by mouth 4 (four) times daily. 120 tablet 1  . Accu-Chek Softclix Lancets lancets Use to test sugars twice daily 100 each 3  . acetaminophen (TYLENOL) 500 MG tablet Take 1,000 mg by mouth every 6 (six) hours as needed for mild pain or headache.    Marland Kitchen aspirin EC 81 MG tablet Take 81 mg by mouth daily.    Marland Kitchen atorvastatin (LIPITOR) 80 MG tablet TAKE 1 TABLET EVERY DAY 90 tablet 3  .  Blood Glucose Monitoring Suppl (ACCU-CHEK GUIDE ME) w/Device KIT     . chlorthalidone (HYGROTON) 25 MG tablet TAKE 1 TABLET EVERY DAY 90 tablet 3  . clopidogrel (PLAVIX) 75 MG tablet TAKE 1 TABLET EVERY DAY 90 tablet 3  . fluticasone (FLONASE) 50 MCG/ACT nasal spray Place 2 sprays into both nostrils daily.    Marland Kitchen glucose blood (ACCU-CHEK GUIDE) test strip Blood Glucose Test In Vitro Strip QTY: 100 strip Days: 90 Refills: 3  Written: 06/07/19 Patient Instructions: E11.9 - to check blodd sugars once daily    . isosorbide mononitrate (IMDUR) 30 MG 24 hr tablet Take 30 mg by mouth daily.    Marland Kitchen losartan (COZAAR) 25 MG tablet TAKE 1 TABLET (25 MG TOTAL) BY MOUTH DAILY. 90 tablet 2  . methocarbamol (ROBAXIN) 500 MG tablet Take 500 mg by mouth at bedtime as needed for muscle spasms.    . metoprolol succinate (TOPROL XL) 25 MG 24 hr tablet Take 1 tablet (25 mg total) by mouth daily. 30 tablet 11  . nitroGLYCERIN (NITROSTAT) 0.4 MG SL tablet Place 1 tablet (0.4 mg total) under the tongue every 5 (five) minutes x 3 doses as needed for chest pain. 25 tablet 0  . ondansetron (ZOFRAN-ODT) 4 MG disintegrating tablet Take 4 mg by mouth every 8 (eight) hours as needed for nausea or vomiting.    . pantoprazole (PROTONIX) 40 MG tablet Take 40 mg by mouth.     . potassium chloride SA (KLOR-CON M) 20 MEQ tablet Take 20 mEq by mouth daily.    . ranolazine (RANEXA) 500 MG 12 hr tablet TAKE 1 TABLET TWICE DAILY 180 tablet 3  . RYBELSUS 3 MG TABS TAKE 1 TABLET EVERY DAY 90 tablet 3  . Vitamin D, Ergocalciferol, (DRISDOL) 1.25 MG (50000 UNIT) CAPS capsule Take 1 capsule (50,000 Units total) by mouth every 7 (seven) days. 12 capsule 1   No current facility-administered medications for this visit.    Allergies:   Lisinopril, Meloxicam, Tramadol, Amoxicillin, Amoxicillin-pot clavulanate, and Clavulanic acid    Social History:   reports that she quit smoking about 24 years ago. Her smoking use included cigarettes. She has  never used smokeless tobacco. She reports that she does not drink alcohol and does not use drugs.   Family History:  family history includes COPD in her brother; Cirrhosis in her father; Coronary artery disease in her brother, father, and sister; Diabetes type II in her father; Heart failure in her mother; Hypertension in her father; Kidney disease in her father; Pulmonary embolism in her mother; Stroke in her sister.    ROS:     Review of Systems  Constitutional:  Positive for diaphoresis.  HENT: Negative.    Eyes: Negative.   Respiratory:  Positive for shortness of breath.   Cardiovascular:  Positive for palpitations. Negative for chest pain.  Gastrointestinal:  Positive for nausea and vomiting.  Genitourinary: Negative.   Musculoskeletal: Negative.   Skin: Negative.   Neurological: Negative.  Endo/Heme/Allergies: Negative.   Psychiatric/Behavioral: Negative.    All other systems reviewed and are negative.     All other systems are reviewed and negative.    PHYSICAL EXAM: VS:  BP (!) 143/71   Pulse 62   Ht 5\' 3"  (1.6 m)   Wt 129 lb 6.4 oz (58.7 kg)   SpO2 99%   BMI 22.92 kg/m  , BMI Body mass index is 22.92 kg/m. Last weight:  Wt Readings from Last 3 Encounters:  09/04/23 129 lb 6.4 oz (58.7 kg)  08/15/23 134 lb 12.8 oz (61.1 kg)  08/03/23 133 lb 9.6 oz (60.6 kg)     Physical Exam Constitutional:      Appearance: Normal appearance.  Cardiovascular:     Rate and Rhythm: Normal rate and regular rhythm.     Heart sounds: Normal heart sounds.  Pulmonary:     Effort: Pulmonary effort is normal.     Breath sounds: Normal breath sounds.  Musculoskeletal:     Right lower leg: No edema.     Left lower leg: No edema.  Neurological:     Mental Status: She is alert.      EKG:   Recent Labs: 02/18/2023: B Natriuretic Peptide 16.0; Magnesium 1.7 07/29/2023: Hemoglobin 12.6; Platelets 341 08/15/2023: ALT 12; BUN 11; Creatinine, Ser 1.09; Potassium 3.3; Sodium 140; TSH  1.260    Lipid Panel    Component Value Date/Time   CHOL 152 08/15/2023 0944   TRIG 73 08/15/2023 0944   HDL 50 08/15/2023 0944   CHOLHDL 3.0 08/15/2023 0944   CHOLHDL 4.0 07/04/2022 0623   VLDL 14 07/04/2022 0623   LDLCALC 88 08/15/2023 0944      Other studies Reviewed: Additional studies/ records that were reviewed today include:  Review of the above records demonstrates:       No data to display            ASSESSMENT AND PLAN:    ICD-10-CM   1. Palpitation  R00.2 sucralfate (CARAFATE) 1 g tablet   has GERD, getting worse    2. Benign essential hypertension  I10 sucralfate (CARAFATE) 1 g tablet    3. Gastroesophageal reflux disease without esophagitis  K21.9 sucralfate (CARAFATE) 1 g tablet    4. Mixed hyperlipidemia  E78.2 sucralfate (CARAFATE) 1 g tablet    5. Coronary graft stenosis, subsequent encounter  T82.857D sucralfate (CARAFATE) 1 g tablet   stress test 3/24 normal    6. Peripheral vascular disease (HCC)  I73.9 sucralfate (CARAFATE) 1 g tablet       Problem List Items Addressed This Visit       Cardiovascular and Mediastinum   Benign essential hypertension   Relevant Medications   sucralfate (CARAFATE) 1 g tablet   Coronary graft stenosis   Relevant Medications   sucralfate (CARAFATE) 1 g tablet   Peripheral vascular disease (HCC)   Relevant Medications   sucralfate (CARAFATE) 1 g tablet     Digestive   GERD (gastroesophageal reflux disease)   Relevant Medications   sucralfate (CARAFATE) 1 g tablet     Other   Mixed hyperlipidemia   Relevant Medications   sucralfate (CARAFATE) 1 g tablet   Other Visit Diagnoses     Palpitation    -  Primary   has GERD, getting worse   Relevant Medications   sucralfate (CARAFATE) 1 g tablet          Disposition:   No follow-ups on file.    Total  time spent: 30 minutes  Signed,  Adrian Blackwater, MD  09/04/2023 11:29 AM    [facility]

## 2023-09-07 ENCOUNTER — Other Ambulatory Visit: Payer: Self-pay | Admitting: Internal Medicine

## 2023-09-15 ENCOUNTER — Encounter: Payer: Self-pay | Admitting: Family

## 2023-09-15 ENCOUNTER — Ambulatory Visit: Payer: Medicare HMO | Admitting: Family

## 2023-09-15 VITALS — BP 118/82 | HR 87 | Ht 63.0 in | Wt 128.6 lb

## 2023-09-15 DIAGNOSIS — Z23 Encounter for immunization: Secondary | ICD-10-CM

## 2023-09-15 DIAGNOSIS — R197 Diarrhea, unspecified: Secondary | ICD-10-CM | POA: Diagnosis not present

## 2023-09-15 DIAGNOSIS — E1165 Type 2 diabetes mellitus with hyperglycemia: Secondary | ICD-10-CM | POA: Diagnosis not present

## 2023-09-15 DIAGNOSIS — R21 Rash and other nonspecific skin eruption: Secondary | ICD-10-CM

## 2023-09-15 LAB — POCT CBG (FASTING - GLUCOSE)-MANUAL ENTRY: Glucose Fasting, POC: 186 mg/dL — AB (ref 70–99)

## 2023-09-15 MED ORDER — ZINC OXIDE 16 % EX OINT: 1.0000 | TOPICAL_OINTMENT | Freq: Four times a day (QID) | CUTANEOUS | 3 refills | Status: DC | PRN

## 2023-09-16 ENCOUNTER — Encounter: Payer: Self-pay | Admitting: Family

## 2023-09-16 LAB — CMP14+EGFR
ALT: 16 [IU]/L (ref 0–32)
AST: 17 [IU]/L (ref 0–40)
Albumin: 4.2 g/dL (ref 3.9–4.9)
Alkaline Phosphatase: 99 [IU]/L (ref 44–121)
BUN/Creatinine Ratio: 10 — ABNORMAL LOW (ref 12–28)
BUN: 12 mg/dL (ref 8–27)
Bilirubin Total: 0.6 mg/dL (ref 0.0–1.2)
CO2: 24 mmol/L (ref 20–29)
Calcium: 9.1 mg/dL (ref 8.7–10.3)
Chloride: 97 mmol/L (ref 96–106)
Creatinine, Ser: 1.21 mg/dL — ABNORMAL HIGH (ref 0.57–1.00)
Globulin, Total: 2.2 g/dL (ref 1.5–4.5)
Glucose: 131 mg/dL — ABNORMAL HIGH (ref 70–99)
Potassium: 3.3 mmol/L — ABNORMAL LOW (ref 3.5–5.2)
Sodium: 138 mmol/L (ref 134–144)
Total Protein: 6.4 g/dL (ref 6.0–8.5)
eGFR: 51 mL/min/{1.73_m2} — ABNORMAL LOW (ref >59)

## 2023-09-16 NOTE — Progress Notes (Signed)
Acute Office Visit  Subjective:     Patient ID: Kayla Malone, female    DOB: [birth year], 62 y.o.   MRN: [id number]  Patient is in today for  Chief Complaint  Patient presents with   Follow-up    Loose stools skin rash    Patient here today with concerns as her stomach issues have started again.  She is having significant diarrhea that occurs with very little warning.  She also has been having issues with a rash in her groin as a result, says it is very itchy and hurts a lot when she's going to the bathroom.   No other concerns today.      Review of Systems  Gastrointestinal:  Positive for abdominal pain, diarrhea and nausea.  Skin:  Positive for itching and rash.  All other systems reviewed and are negative.       Objective:    BP 118/82   Pulse 87   Ht 5\' 3"  (1.6 m)   Wt 128 lb 9.6 oz (58.3 kg)   SpO2 98%   BMI 22.78 kg/m   Physical Exam Vitals and nursing note reviewed.  Constitutional:      Appearance: Normal appearance. She is normal weight.  HENT:     Head: Normocephalic.  Eyes:     Extraocular Movements: Extraocular movements intact.     Conjunctiva/sclera: Conjunctivae normal.     Pupils: Pupils are equal, round, and reactive to light.  Cardiovascular:     Rate and Rhythm: Normal rate.  Pulmonary:     Effort: Pulmonary effort is normal.  Neurological:     General: No focal deficit present.     Mental Status: She is alert and oriented to person, place, and time. Mental status is at baseline.  Psychiatric:        Mood and Affect: Mood normal.        Behavior: Behavior normal.        Thought Content: Thought content normal.        Judgment: Judgment normal.     Results for orders placed or performed in visit on 09/15/23  CMP14+EGFR  Result Value Ref Range   Glucose 131 (H) 70 - 99 mg/dL   BUN 12 8 - 27 mg/dL   Creatinine, Ser 4.03 (H) 0.57 - 1.00 mg/dL   eGFR 51 (L) >47 QQ/VZD/6.38   BUN/Creatinine Ratio 10 (L) 12 - 28   Sodium  138 134 - 144 mmol/L   Potassium 3.3 (L) 3.5 - 5.2 mmol/L   Chloride 97 96 - 106 mmol/L   CO2 24 20 - 29 mmol/L   Calcium 9.1 8.7 - 10.3 mg/dL   Total Protein 6.4 6.0 - 8.5 g/dL   Albumin 4.2 3.9 - 4.9 g/dL   Globulin, Total 2.2 1.5 - 4.5 g/dL   Bilirubin Total 0.6 0.0 - 1.2 mg/dL   Alkaline Phosphatase 99 44 - 121 IU/L   AST 17 0 - 40 IU/L   ALT 16 0 - 32 IU/L  POCT CBG (Fasting - Glucose)  Result Value Ref Range   Glucose Fasting, POC 186 (A) 70 - 99 mg/dL    Recent Results (from the past 2160 hour(s))  Basic metabolic panel     Status: Abnormal   Collection Time: 07/29/23  4:03 AM  Result Value Ref Range   Sodium 133 (L) 135 - 145 mmol/L   Potassium 3.2 (L) 3.5 - 5.1 mmol/L   Chloride 97 (L) 98 - 111 mmol/L  CO2 27 22 - 32 mmol/L   Glucose, Bld 128 (H) 70 - 99 mg/dL    Comment: Glucose reference range applies only to samples taken after fasting for at least 8 hours.   BUN 13 8 - 23 mg/dL   Creatinine, Ser 1.61 (H) 0.44 - 1.00 mg/dL   Calcium 8.9 8.9 - 09.6 mg/dL   GFR, Estimated 55 (L) >60 mL/min    Comment: (NOTE) Calculated using the CKD-EPI Creatinine Equation (2021)    Anion gap 9 5 - 15    Comment: Performed at [facility], [address] [place], [place] [address]  CBC     Status: None   Collection Time: 07/29/23  4:03 AM  Result Value Ref Range   WBC 5.8 4.0 - 10.5 K/uL   RBC 4.42 3.87 - 5.11 MIL/uL   Hemoglobin 12.6 12.0 - 15.0 g/dL   HCT 98.1 19.1 - 47.8 %   MCV 85.5 80.0 - 100.0 fL   MCH 28.5 26.0 - 34.0 pg   MCHC 33.3 30.0 - 36.0 g/dL   RDW 29.5 62.1 - 30.8 %   Platelets 341 150 - 400 K/uL   nRBC 0.0 0.0 - 0.2 %    Comment: Performed at [facility], [address], [place], [place] [address]  Troponin I (High Sensitivity)     Status: Abnormal   Collection Time: 07/29/23  4:03 AM  Result Value Ref Range   Troponin I (High Sensitivity) 19 (H) <18 ng/L    Comment: (NOTE) Elevated high sensitivity troponin I (hsTnI) values and  significant  changes across serial measurements may suggest ACS but many other  chronic and acute conditions are known to elevate hsTnI results.  Refer to the "Links" section for chest pain algorithms and additional  guidance. Performed at [facility], [address] [place], [place] [address]   Troponin I (High Sensitivity)     Status: Abnormal   Collection Time: 07/29/23  5:56 AM  Result Value Ref Range   Troponin I (High Sensitivity) 18 (H) <18 ng/L    Comment: (NOTE) Elevated high sensitivity troponin I (hsTnI) values and significant  changes across serial measurements may suggest ACS but many other  chronic and acute conditions are known to elevate hsTnI results.  Refer to the "Links" section for chest pain algorithms and additional  guidance. Performed at [facility], [address] [place], [place] [address]   Lipid panel     Status: None   Collection Time: 08/15/23  9:44 AM  Result Value Ref Range   Cholesterol, Total 152 100 - 199 mg/dL   Triglycerides 73 0 - 149 mg/dL   HDL 50 >32 mg/dL   VLDL Cholesterol Cal 14 5 - 40 mg/dL   LDL Chol Calc (NIH) 88 0 - 99 mg/dL   Chol/HDL Ratio 3.0 0.0 - 4.4 ratio    Comment:                                   T. Chol/HDL Ratio                                             Men  Women  1/2 Avg.Risk  3.4    3.3                                   Avg.Risk  5.0    4.4                                2X Avg.Risk  9.6    7.1                                3X Avg.Risk 23.4   11.0   VITAMIN D 25 Hydroxy (Vit-D Deficiency, Fractures)     Status: Abnormal   Collection Time: 08/15/23  9:44 AM  Result Value Ref Range   Vit D, 25-Hydroxy 26.8 (L) 30.0 - 100.0 ng/mL    Comment: Vitamin D deficiency has been defined by the Institute of Medicine and an Endocrine Society practice guideline as a level of serum 25-OH vitamin D less than 20 ng/mL (1,2). The Endocrine Society went on to further define  vitamin D insufficiency as a level between 21 and 29 ng/mL (2). 1. IOM (Institute of Medicine). 2010. Dietary reference    intakes for calcium and D. [place]: The    [employer]. 2. Holick MF, Binkley Anderson, Bischoff-Ferrari HA, et al.    Evaluation, treatment, and prevention of vitamin D    deficiency: an Endocrine Society clinical practice    guideline. JCEM. 2011 Jul; 96(7):1911-30.   CMP14+EGFR     Status: Abnormal   Collection Time: 08/15/23  9:44 AM  Result Value Ref Range   Glucose 122 (H) 70 - 99 mg/dL   BUN 11 8 - 27 mg/dL   Creatinine, Ser 1.61 (H) 0.57 - 1.00 mg/dL   eGFR 57 (L) >09 UE/AVW/0.98   BUN/Creatinine Ratio 10 (L) 12 - 28   Sodium 140 134 - 144 mmol/L   Potassium 3.3 (L) 3.5 - 5.2 mmol/L   Chloride 98 96 - 106 mmol/L   CO2 28 20 - 29 mmol/L   Calcium 9.5 8.7 - 10.3 mg/dL   Total Protein 6.7 6.0 - 8.5 g/dL   Albumin 4.2 3.9 - 4.9 g/dL   Globulin, Total 2.5 1.5 - 4.5 g/dL   Bilirubin Total 0.4 0.0 - 1.2 mg/dL   Alkaline Phosphatase 94 44 - 121 IU/L   AST 13 0 - 40 IU/L   ALT 12 0 - 32 IU/L  TSH     Status: None   Collection Time: 08/15/23  9:44 AM  Result Value Ref Range   TSH 1.260 0.450 - 4.500 uIU/mL  Hemoglobin A1c     Status: Abnormal   Collection Time: 08/15/23  9:44 AM  Result Value Ref Range   Hgb A1c MFr Bld 6.7 (H) 4.8 - 5.6 %    Comment:          Prediabetes: 5.7 - 6.4          Diabetes: >6.4          Glycemic control for adults with diabetes: <7.0    Est. average glucose Bld gHb Est-mCnc 146 mg/dL  Vitamin J19     Status: None   Collection Time: 08/15/23  9:44 AM  Result Value Ref Range   Vitamin B-12 353 232 - 1,245 pg/mL  POCT CBG (Fasting -  Glucose)     Status: Abnormal   Collection Time: 09/15/23 11:36 AM  Result Value Ref Range   Glucose Fasting, POC 186 (A) 70 - 99 mg/dL  [id number]     Status: Abnormal   Collection Time: 09/15/23 12:13 PM  Result Value Ref Range   Glucose 131 (H) 70 - 99 mg/dL   BUN 12 8 - 27  mg/dL   Creatinine, Ser 4.09 (H) 0.57 - 1.00 mg/dL   eGFR 51 (L) >81 XB/JYN/8.29   BUN/Creatinine Ratio 10 (L) 12 - 28   Sodium 138 134 - 144 mmol/L   Potassium 3.3 (L) 3.5 - 5.2 mmol/L   Chloride 97 96 - 106 mmol/L   CO2 24 20 - 29 mmol/L   Calcium 9.1 8.7 - 10.3 mg/dL   Total Protein 6.4 6.0 - 8.5 g/dL   Albumin 4.2 3.9 - 4.9 g/dL   Globulin, Total 2.2 1.5 - 4.5 g/dL   Bilirubin Total 0.6 0.0 - 1.2 mg/dL   Alkaline Phosphatase 99 44 - 121 IU/L   AST 17 0 - 40 IU/L   ALT 16 0 - 32 IU/L       Assessment & Plan:   Problem List Items Addressed This Visit       Active Problems   Type 2 diabetes mellitus with hyperglycemia (HCC)    Continue current diabetes POC, as patient has been well controlled on current regimen.  Will adjust meds if needed based on labs.        Relevant Orders   POCT CBG (Fasting - Glucose) (Completed)   CMP14+EGFR (Completed)   Influenza, MDCK, trivalent, PF(Flucelvax egg-free) (Completed)   Other Visit Diagnoses     Diarrhea, unspecified type    -  Primary   Doing blood work and stool testing.  Patient given supplies to bring sample back to the office.  Given current skin breakdown, I have sent rx for zinc oxide.   Relevant Orders   Culture, Stool   H. pylori antigen, stool   Cdiff NAA+O+P+Stool Culture   Pancreatic Elastase, Fecal   CMP14+EGFR (Completed)   Rash of perineum       Sending zinc oxide ointment for her.        Return to be determined based on results..  Total time spent: 20 minutes  Miki Kins, FNP  09/15/2023   This document may have been prepared by [facility] Voice Recognition software and as such may include unintentional dictation errors.

## 2023-09-16 NOTE — Assessment & Plan Note (Signed)
Continue current diabetes POC, as patient has been well controlled on current regimen.  Will adjust meds if needed based on labs.  

## 2023-09-18 DIAGNOSIS — M25551 Pain in right hip: Secondary | ICD-10-CM | POA: Diagnosis not present

## 2023-09-18 DIAGNOSIS — R1084 Generalized abdominal pain: Secondary | ICD-10-CM | POA: Diagnosis not present

## 2023-09-18 DIAGNOSIS — R197 Diarrhea, unspecified: Secondary | ICD-10-CM | POA: Diagnosis not present

## 2023-09-18 DIAGNOSIS — S29012A Strain of muscle and tendon of back wall of thorax, initial encounter: Secondary | ICD-10-CM | POA: Diagnosis not present

## 2023-09-20 LAB — PANCREATIC ELASTASE, FECAL: Pancreatic Elastase, Fecal: >800 ug Elast./g (ref >200)

## 2023-09-21 DIAGNOSIS — R112 Nausea with vomiting, unspecified: Secondary | ICD-10-CM | POA: Diagnosis not present

## 2023-09-21 DIAGNOSIS — I10 Essential (primary) hypertension: Secondary | ICD-10-CM | POA: Diagnosis not present

## 2023-09-21 DIAGNOSIS — R1314 Dysphagia, pharyngoesophageal phase: Secondary | ICD-10-CM | POA: Diagnosis not present

## 2023-09-21 DIAGNOSIS — E1159 Type 2 diabetes mellitus with other circulatory complications: Secondary | ICD-10-CM | POA: Diagnosis not present

## 2023-09-21 DIAGNOSIS — K219 Gastro-esophageal reflux disease without esophagitis: Secondary | ICD-10-CM | POA: Diagnosis not present

## 2023-09-22 ENCOUNTER — Ambulatory Visit: Payer: Medicare HMO

## 2023-09-22 DIAGNOSIS — R1314 Dysphagia, pharyngoesophageal phase: Secondary | ICD-10-CM | POA: Diagnosis not present

## 2023-09-22 DIAGNOSIS — R1313 Dysphagia, pharyngeal phase: Secondary | ICD-10-CM | POA: Diagnosis not present

## 2023-09-22 DIAGNOSIS — K219 Gastro-esophageal reflux disease without esophagitis: Secondary | ICD-10-CM | POA: Diagnosis not present

## 2023-09-22 LAB — CDIFF NAA+O+P+STOOL CULTURE
E coli, Shiga toxin Assay: NEGATIVE
Toxigenic C. Difficile by PCR: NEGATIVE

## 2023-09-22 LAB — STOOL CULTURE: E coli, Shiga toxin Assay: NEGATIVE

## 2023-09-22 LAB — H. PYLORI ANTIGEN, STOOL: H pylori Ag, Stl: NEGATIVE

## 2023-09-25 ENCOUNTER — Telehealth: Payer: Self-pay | Admitting: Family

## 2023-09-25 NOTE — Telephone Encounter (Signed)
Patient left VM inquiring about her lab results. Please advise.

## 2023-09-27 ENCOUNTER — Other Ambulatory Visit: Payer: Self-pay | Admitting: Cardiovascular Disease

## 2023-09-27 DIAGNOSIS — K219 Gastro-esophageal reflux disease without esophagitis: Secondary | ICD-10-CM

## 2023-09-27 DIAGNOSIS — I10 Essential (primary) hypertension: Secondary | ICD-10-CM

## 2023-09-27 DIAGNOSIS — T82857D Stenosis of cardiac prosthetic devices, implants and grafts, subsequent encounter: Secondary | ICD-10-CM

## 2023-09-27 DIAGNOSIS — E782 Mixed hyperlipidemia: Secondary | ICD-10-CM

## 2023-09-27 DIAGNOSIS — I739 Peripheral vascular disease, unspecified: Secondary | ICD-10-CM

## 2023-09-27 DIAGNOSIS — R002 Palpitations: Secondary | ICD-10-CM

## 2023-09-28 ENCOUNTER — Other Ambulatory Visit: Payer: Self-pay | Admitting: Family

## 2023-09-28 DIAGNOSIS — R112 Nausea with vomiting, unspecified: Secondary | ICD-10-CM

## 2023-10-03 ENCOUNTER — Ambulatory Visit (INDEPENDENT_AMBULATORY_CARE_PROVIDER_SITE_OTHER): Payer: Medicare HMO | Admitting: Podiatry

## 2023-10-03 ENCOUNTER — Encounter: Payer: Self-pay | Admitting: Podiatry

## 2023-10-03 VITALS — BP 141/76 | HR 64

## 2023-10-03 DIAGNOSIS — E1165 Type 2 diabetes mellitus with hyperglycemia: Secondary | ICD-10-CM | POA: Diagnosis not present

## 2023-10-03 DIAGNOSIS — E119 Type 2 diabetes mellitus without complications: Secondary | ICD-10-CM

## 2023-10-03 NOTE — Progress Notes (Signed)
Subjective: Kayla Malone presents today referred by Miki Kins, FNP for diabetic foot evaluation.  Patient relates many year history of diabetes.  Patient denies any history of foot wounds.  Patient has history of numbness, tingling, burning, pins/needles sensations.  Past Medical History:  Diagnosis Date   Adenomatous colon polyp 02/20/2014   Asthma    Benign neoplasm of colon 02/20/2014   Chest pain 09/13/2013   Coronary angioplasty status 11/13/2018   Coronary artery disease    Depression    Diabetes mellitus without complication (HCC)    GERD (gastroesophageal reflux disease)    History of hiatal hernia    Hypertension    Hypokalemia 05/29/2019   Hypomagnesemia 07/02/2022   Nausea with vomiting, unspecified 06/07/2019   NSTEMI (non-ST elevated myocardial infarction) (HCC) 09/23/2018   Pain    BACK   Stroke (HCC)    x3   Thyroid cyst    TIA (transient ischemic attack) 08/06/2017   Unstable angina (HCC) 10/20/2017   Weight loss 06/07/2019    Patient Active Problem List   Diagnosis Date Noted   Osteopenia of neck of left femur 05/11/2023   Dizziness 04/24/2023   Bradycardia 04/24/2023   Degeneration of lumbar intervertebral disc 02/02/2023   Low back pain 02/02/2023   Peptic ulcer disease 07/02/2022   Arthritis 07/02/2022   Osteoarthritis of right hip 04/01/2022   Claustrophobia 03/28/2022   Localized, primary osteoarthritis of shoulder region 03/17/2022   Lymphedema 03/04/2021   Nontoxic uninodular goiter 11/13/2018   Coronary graft stenosis 11/13/2018   Peripheral vascular disease (HCC) 11/13/2018   Postsurgical aortocoronary bypass status 10/30/2017   Coronary artery disease of native artery of native heart with stable angina pectoris (HCC) 10/23/2017   Type 2 diabetes mellitus with hyperglycemia (HCC) 09/15/2017   Vitamin D deficiency, unspecified 09/15/2017   Spinal stenosis of lumbar region 02/15/2017   Benign essential hypertension  09/23/2014   Mixed hyperlipidemia 09/13/2013   Asthma 09/13/2013   GERD (gastroesophageal reflux disease) 09/13/2013   CVA, old, hemiparesis (HCC) 09/13/2013    Past Surgical History:  Procedure Laterality Date   ABDOMINAL HYSTERECTOMY     CARDIAC SURGERY     CATARACT EXTRACTION W/PHACO Left 08/15/2017   Procedure: CATARACT EXTRACTION PHACO AND INTRAOCULAR LENS PLACEMENT (IOC);  Surgeon: Galen Manila, MD;  Location: [facility] ORS;  Service: Ophthalmology;  Laterality: Left;  [place] 00:40.6 AP% 7.3 CDE 2.98 FLUID PACK LOT # [id number] h   CORONARY ARTERY BYPASS GRAFT N/A 10/23/2017   Procedure: CORONARY ARTERY BYPASS GRAFTING (CABG) x two  , using left internal mammary artery and right leg greater saphenous vein harvested  endoscopically - LIMA to LAD, SVG to RCA;  Surgeon: Delight Ovens, MD;  Location: [facility] OR;  Service: Open Heart Surgery;  Laterality: N/A;   CORONARY STENT INTERVENTION N/A 09/24/2018   Procedure: CORONARY STENT INTERVENTION;  Surgeon: Corky Crafts, MD;  Location: [facility] INVASIVE CV LAB;  Service: Cardiovascular;  Laterality: N/A;   ESOPHAGOGASTRODUODENOSCOPY N/A 05/30/2019   Procedure: ESOPHAGOGASTRODUODENOSCOPY (EGD);  Surgeon: Toledo, Boykin Nearing, MD;  Location: [facility] ENDOSCOPY;  Service: Gastroenterology;  Laterality: N/A;   EYE SURGERY     LEFT HEART CATH AND CORONARY ANGIOGRAPHY Left 10/09/2017   Procedure: LEFT HEART CATH AND CORONARY ANGIOGRAPHY;  Surgeon: Laurier Nancy, MD;  Location: [facility] INVASIVE CV LAB;  Service: Cardiovascular;  Laterality: Left;   LEFT HEART CATH AND CORS/GRAFTS ANGIOGRAPHY N/A 09/24/2018   Procedure: LEFT HEART CATH AND CORS/GRAFTS ANGIOGRAPHY;  Surgeon: Corky Crafts,  MD;  Location: [facility] INVASIVE CV LAB;  Service: Cardiovascular;  Laterality: N/A;   LEFT HEART CATH AND CORS/GRAFTS ANGIOGRAPHY N/A 07/04/2022   Procedure: LEFT HEART CATH AND CORS/GRAFTS ANGIOGRAPHY;  Surgeon: Laurier Nancy, MD;  Location: [facility] INVASIVE CV LAB;  Service:  Cardiovascular;  Laterality: N/A;   TEE WITHOUT CARDIOVERSION N/A 10/23/2017   Procedure: TRANSESOPHAGEAL ECHOCARDIOGRAM (TEE);  Surgeon: Delight Ovens, MD;  Location: [facility] OR;  Service: Open Heart Surgery;  Laterality: N/A;    Current Outpatient Medications on File Prior to Visit  Medication Sig Dispense Refill   Accu-Chek Softclix Lancets lancets Use to test sugars twice daily 100 each 3   acetaminophen (TYLENOL) 500 MG tablet Take 1,000 mg by mouth every 6 (six) hours as needed for mild pain or headache.     aspirin EC 81 MG tablet Take 81 mg by mouth daily.     atorvastatin (LIPITOR) 80 MG tablet TAKE 1 TABLET EVERY DAY 90 tablet 3   Blood Glucose Monitoring Suppl (ACCU-CHEK GUIDE ME) w/Device KIT      chlorthalidone (HYGROTON) 25 MG tablet TAKE 1 TABLET EVERY DAY 90 tablet 3   clopidogrel (PLAVIX) 75 MG tablet TAKE 1 TABLET EVERY DAY 90 tablet 3   fluticasone (FLONASE) 50 MCG/ACT nasal spray Place 2 sprays into both nostrils daily.     glucose blood (ACCU-CHEK GUIDE) test strip Blood Glucose Test In Vitro Strip QTY: 100 strip Days: 90 Refills: 3  Written: 06/07/19 Patient Instructions: E11.9 - to check blodd sugars once daily     isosorbide mononitrate (IMDUR) 30 MG 24 hr tablet TAKE 1 TABLET EVERY DAY 90 tablet 3   losartan (COZAAR) 25 MG tablet TAKE 1 TABLET (25 MG TOTAL) BY MOUTH DAILY. 90 tablet 2   methocarbamol (ROBAXIN) 500 MG tablet Take 500 mg by mouth at bedtime as needed for muscle spasms.     metoprolol succinate (TOPROL XL) 25 MG 24 hr tablet Take 1 tablet (25 mg total) by mouth daily. 30 tablet 11   nitroGLYCERIN (NITROSTAT) 0.4 MG SL tablet Place 1 tablet (0.4 mg total) under the tongue every 5 (five) minutes x 3 doses as needed for chest pain. 25 tablet 0   ondansetron (ZOFRAN-ODT) 4 MG disintegrating tablet DISSOLVE 1 TABLET IN MOUTH UP TO EVERY 8 HOURS AS NEEDED FOR NAUSEA 90 tablet 3   pantoprazole (PROTONIX) 40 MG tablet Take 40 mg by mouth.      potassium chloride  SA (KLOR-CON M) 20 MEQ tablet Take 20 mEq by mouth daily.     ranolazine (RANEXA) 500 MG 12 hr tablet TAKE 1 TABLET TWICE DAILY 180 tablet 3   RYBELSUS 3 MG TABS TAKE 1 TABLET EVERY DAY 90 tablet 3   sucralfate (CARAFATE) 1 g tablet TAKE 1 TABLET BY MOUTH 4 TIMES DAILY. 360 tablet 1   Vitamin D, Ergocalciferol, (DRISDOL) 1.25 MG (50000 UNIT) CAPS capsule Take 1 capsule (50,000 Units total) by mouth every 7 (seven) days. 12 capsule 1   Zinc Oxide 16 % OINT Apply 1 Application topically 4 (four) times daily as needed. 454 g 3   No current facility-administered medications on file prior to visit.     Allergies  Allergen Reactions   Lisinopril Swelling and Shortness Of Breath   Meloxicam Nausea Only and Nausea And Vomiting   Tramadol Nausea Only and Other (See Comments)    sleepiness  Other reaction(s): Dizziness, Other, Other (See Comments)  somnolence  Other reaction(s): Other (See Comments), Other (See  Comments)  Would not wake up after taking this medication   Amoxicillin Rash    Don't remember  Don't remember  Don't remember    Amoxicillin-Pot Clavulanate Hives and Rash   Clavulanic Acid Rash    Social History   Occupational History   Not on file  Tobacco Use   Smoking status: Former    Current packs/day: 0.00    Types: Cigarettes    Quit date: 09/13/1998    Years since quitting: 25.0   Smokeless tobacco: Never  Vaping Use   Vaping status: Never Used  Substance and Sexual Activity   Alcohol use: No   Drug use: No   Sexual activity: Yes    Birth control/protection: Post-menopausal    Family History  Problem Relation Age of Onset   Heart failure Mother    Pulmonary embolism Mother    Cirrhosis Father    Coronary artery disease Father    Diabetes type II Father    Hypertension Father    Kidney disease Father    Coronary artery disease Sister    Stroke Sister    COPD Brother    Coronary artery disease Brother    Breast cancer Neg Hx     Immunization  History  Administered Date(s) Administered   Influenza, Mdck, Trivalent,PF 6+ MOS(egg free) 09/15/2023   Influenza,inj,quad, With Preservative 01/12/2017   Influenza-Unspecified 06/17/2019   PFIZER(Purple Top)SARS-COV-2 Vaccination 04/05/2020, 04/26/2020   Pneumococcal Polysaccharide-23 09/29/2017   Tdap 06/18/2020    Review of systems: Positive Findings in bold print.  Constitutional:  chills, fatigue, fever, sweats, weight change Communication: Nurse, learning disability, sign Presenter, broadcasting, hand writing, iPad/Android device Head: headaches, head injury Eyes: changes in vision, eye pain, glaucoma, cataracts, macular degeneration, diplopia, glare,  light sensitivity, eyeglasses or contacts, blindness Ears nose mouth throat: hearing impaired, hearing aids,  ringing in ears, deaf, sign language,  vertigo, nosebleeds,  rhinitis,  cold sores, snoring, swollen glands Cardiovascular: HTN, edema, arrhythmia, pacemaker in place, defibrillator in place, chest pain/tightness, chronic anticoagulation, blood clot, heart failure, MI Peripheral Vascular: leg cramps, varicose veins, blood clots, lymphedema, varicosities Respiratory:  asthma, difficulty breathing, denies congestion, SOB, wheezing, cough, emphysema Gastrointestinal: change in appetite or weight, abdominal pain, constipation, diarrhea, nausea, vomiting, vomiting blood, change in bowel habits, abdominal pain, jaundice, rectal bleeding, hemorrhoids, GERD Genitourinary:  nocturia,  pain on urination, polyuria,  blood in urine, Foley catheter, urinary urgency, ESRD on hemodialysis Musculoskeletal: amputation, cramping, stiff joints, painful joints, decreased joint motion, fractures, OA, gout, hemiplegia, paraplegia, uses cane, wheelchair bound, uses walker, uses rollator Skin: +changes in toenails, color change, dryness, itching, mole changes,  rash, wound(s) Neurological: headaches, numbness in feet, paresthesias in feet, burning in feet, fainting,   seizures, change in speech, migraines, memory problems/poor historian, cerebral palsy, weakness, paralysis, CVA, TIA Endocrine: diabetes, hypothyroidism, hyperthyroidism,  goiter, dry mouth, flushing, heat intolerance, cold intolerance,  excessive thirst, denies polyuria,  nocturia Hematological:  easy bleeding, excessive bleeding, easy bruising, enlarged lymph nodes, on long term blood thinner, history of past transusions Allergy/immunological:  hives, eczema, frequent infections, multiple drug allergies, seasonal allergies, transplant recipient, multiple food allergies Psychiatric:  anxiety, depression, mood disorder, suicidal ideations, hallucinations, insomnia  Objective: Vitals:   10/03/23 0912  BP: (!) 141/76  Pulse: 64   Vascular Examination: Capillary refill time less than 3 seconds x 10 digits.  Dorsalis pedis pulses palpable 2 out of 4.  Posterior tibial pulses palpable 2 out of 4.  Digital hair not x 10 digits.  Skin  temperature gradient WNL b/l.  Dermatological Examination: Skin with normal turgor, texture and tone b/l  Toenails 1-5 b/l discolored, thick, dystrophic with subungual debris and pain with palpation to nailbeds due to thickness of nails.  Musculoskeletal: Muscle strength 5/5 to all LE muscle groups.  Neurological: Sensation diminished with 10 gram monofilament.  Vibratory sensation diminished  Assessment: NIDDM Encounter for diabetic foot examination  Plan: Discussed diabetic foot care principles. Literature dispensed on today. Patient to continue soft, supportive shoe gear daily. Patient to report any pedal injuries to medical professional immediately. Follow up one year. Patient/POA to call should there be a concern in the interim.

## 2023-10-26 ENCOUNTER — Other Ambulatory Visit: Payer: Self-pay | Admitting: Family

## 2023-10-31 ENCOUNTER — Other Ambulatory Visit: Payer: Self-pay | Admitting: Cardiovascular Disease

## 2023-11-02 DIAGNOSIS — M5416 Radiculopathy, lumbar region: Secondary | ICD-10-CM | POA: Diagnosis not present

## 2023-11-02 DIAGNOSIS — M545 Low back pain, unspecified: Secondary | ICD-10-CM | POA: Diagnosis not present

## 2023-11-02 DIAGNOSIS — M1611 Unilateral primary osteoarthritis, right hip: Secondary | ICD-10-CM | POA: Diagnosis not present

## 2023-11-02 DIAGNOSIS — M533 Sacrococcygeal disorders, not elsewhere classified: Secondary | ICD-10-CM | POA: Diagnosis not present

## 2023-11-02 DIAGNOSIS — M25551 Pain in right hip: Secondary | ICD-10-CM | POA: Diagnosis not present

## 2023-11-02 DIAGNOSIS — M51369 Other intervertebral disc degeneration, lumbar region without mention of lumbar back pain or lower extremity pain: Secondary | ICD-10-CM | POA: Diagnosis not present

## 2023-11-02 DIAGNOSIS — M48062 Spinal stenosis, lumbar region with neurogenic claudication: Secondary | ICD-10-CM | POA: Diagnosis not present

## 2023-11-02 DIAGNOSIS — M791 Myalgia, unspecified site: Secondary | ICD-10-CM | POA: Diagnosis not present

## 2023-11-03 ENCOUNTER — Ambulatory Visit: Payer: Medicare HMO | Admitting: Family

## 2023-11-03 ENCOUNTER — Encounter: Payer: Self-pay | Admitting: Cardiovascular Disease

## 2023-11-03 ENCOUNTER — Other Ambulatory Visit: Payer: Self-pay

## 2023-11-03 ENCOUNTER — Ambulatory Visit: Payer: Medicare HMO | Admitting: Cardiovascular Disease

## 2023-11-03 ENCOUNTER — Ambulatory Visit (INDEPENDENT_AMBULATORY_CARE_PROVIDER_SITE_OTHER): Payer: Medicare HMO | Admitting: Cardiovascular Disease

## 2023-11-03 VITALS — BP 144/78 | HR 58 | Ht 63.0 in | Wt 130.4 lb

## 2023-11-03 DIAGNOSIS — R1010 Upper abdominal pain, unspecified: Secondary | ICD-10-CM | POA: Diagnosis not present

## 2023-11-03 DIAGNOSIS — I739 Peripheral vascular disease, unspecified: Secondary | ICD-10-CM | POA: Diagnosis not present

## 2023-11-03 DIAGNOSIS — T82857D Stenosis of cardiac prosthetic devices, implants and grafts, subsequent encounter: Secondary | ICD-10-CM

## 2023-11-03 DIAGNOSIS — I10 Essential (primary) hypertension: Secondary | ICD-10-CM | POA: Diagnosis not present

## 2023-11-03 DIAGNOSIS — E782 Mixed hyperlipidemia: Secondary | ICD-10-CM

## 2023-11-03 NOTE — Progress Notes (Signed)
Cardiology Office Note   Date:  11/03/2023   ID:  Kayla Malone, DOB [birth year], MRN [id number]  PCP:  Miki Kins, FNP  Cardiologist:  Adrian Blackwater, MD      History of Present Illness: Kayla Malone is a 62 y.o. female who presents for  Chief Complaint  Patient presents with   Follow-up    2 month follow up    Has abdominal pain, and wants evaluation of it by U/A, and pain comes and go  Abdominal Pain This is a recurrent problem. The current episode started in the past 7 days. The problem occurs 2 to 4 times per day. The problem has been rapidly worsening. The pain is located in the right flank. The pain is at a severity of 6/10. The pain is moderate.      Past Medical History:  Diagnosis Date   Adenomatous colon polyp 02/20/2014   Asthma    Benign neoplasm of colon 02/20/2014   Chest pain 09/13/2013   Coronary angioplasty status 11/13/2018   Coronary artery disease    Depression    Diabetes mellitus without complication (HCC)    GERD (gastroesophageal reflux disease)    History of hiatal hernia    Hypertension    Hypokalemia 05/29/2019   Hypomagnesemia 07/02/2022   Nausea with vomiting, unspecified 06/07/2019   NSTEMI (non-ST elevated myocardial infarction) (HCC) 09/23/2018   Pain    BACK   Stroke (HCC)    x3   Thyroid cyst    TIA (transient ischemic attack) 08/06/2017   Unstable angina (HCC) 10/20/2017   Weight loss 06/07/2019     Past Surgical History:  Procedure Laterality Date   ABDOMINAL HYSTERECTOMY     CARDIAC SURGERY     CATARACT EXTRACTION W/PHACO Left 08/15/2017   Procedure: CATARACT EXTRACTION PHACO AND INTRAOCULAR LENS PLACEMENT (IOC);  Surgeon: Galen Manila, MD;  Location: [facility] ORS;  Service: Ophthalmology;  Laterality: Left;  [place] 00:40.6 AP% 7.3 CDE 2.98 FLUID PACK LOT # [id number] h   CORONARY ARTERY BYPASS GRAFT N/A 10/23/2017   Procedure: CORONARY ARTERY BYPASS GRAFTING (CABG) x two  , using left internal  mammary artery and right leg greater saphenous vein harvested  endoscopically - LIMA to LAD, SVG to RCA;  Surgeon: Delight Ovens, MD;  Location: [facility] OR;  Service: Open Heart Surgery;  Laterality: N/A;   CORONARY STENT INTERVENTION N/A 09/24/2018   Procedure: CORONARY STENT INTERVENTION;  Surgeon: Corky Crafts, MD;  Location: [facility] INVASIVE CV LAB;  Service: Cardiovascular;  Laterality: N/A;   ESOPHAGOGASTRODUODENOSCOPY N/A 05/30/2019   Procedure: ESOPHAGOGASTRODUODENOSCOPY (EGD);  Surgeon: Toledo, Boykin Nearing, MD;  Location: [facility] ENDOSCOPY;  Service: Gastroenterology;  Laterality: N/A;   EYE SURGERY     LEFT HEART CATH AND CORONARY ANGIOGRAPHY Left 10/09/2017   Procedure: LEFT HEART CATH AND CORONARY ANGIOGRAPHY;  Surgeon: Laurier Nancy, MD;  Location: [facility] INVASIVE CV LAB;  Service: Cardiovascular;  Laterality: Left;   LEFT HEART CATH AND CORS/GRAFTS ANGIOGRAPHY N/A 09/24/2018   Procedure: LEFT HEART CATH AND CORS/GRAFTS ANGIOGRAPHY;  Surgeon: Corky Crafts, MD;  Location: [facility] INVASIVE CV LAB;  Service: Cardiovascular;  Laterality: N/A;   LEFT HEART CATH AND CORS/GRAFTS ANGIOGRAPHY N/A 07/04/2022   Procedure: LEFT HEART CATH AND CORS/GRAFTS ANGIOGRAPHY;  Surgeon: Laurier Nancy, MD;  Location: [facility] INVASIVE CV LAB;  Service: Cardiovascular;  Laterality: N/A;   TEE WITHOUT CARDIOVERSION N/A 10/23/2017   Procedure: TRANSESOPHAGEAL ECHOCARDIOGRAM (TEE);  Surgeon: Delight Ovens, MD;  Location: [facility] OR;  Service: Open Heart Surgery;  Laterality: N/A;     Current Outpatient Medications  Medication Sig Dispense Refill   Accu-Chek Softclix Lancets lancets Use to test sugars twice daily 100 each 3   acetaminophen (TYLENOL) 500 MG tablet Take 1,000 mg by mouth every 6 (six) hours as needed for mild pain or headache.     aspirin EC 81 MG tablet Take 81 mg by mouth daily.     atorvastatin (LIPITOR) 80 MG tablet TAKE 1 TABLET EVERY DAY 90 tablet 3   Blood Glucose Monitoring Suppl  (ACCU-CHEK GUIDE ME) w/Device KIT      chlorthalidone (HYGROTON) 25 MG tablet TAKE 1 TABLET EVERY DAY 90 tablet 3   clopidogrel (PLAVIX) 75 MG tablet TAKE 1 TABLET EVERY DAY 90 tablet 3   fluticasone (FLONASE) 50 MCG/ACT nasal spray USE 2 SPRAYS IN EACH NOSTRIL EVERY DAY 48 g 3   glucose blood (ACCU-CHEK GUIDE) test strip Blood Glucose Test In Vitro Strip QTY: 100 strip Days: 90 Refills: 3  Written: 06/07/19 Patient Instructions: E11.9 - to check blodd sugars once daily     isosorbide mononitrate (IMDUR) 30 MG 24 hr tablet TAKE 1 TABLET EVERY DAY 90 tablet 3   losartan (COZAAR) 25 MG tablet TAKE 1 TABLET (25 MG TOTAL) BY MOUTH DAILY. 90 tablet 2   methocarbamol (ROBAXIN) 500 MG tablet Take 500 mg by mouth at bedtime as needed for muscle spasms.     metoprolol succinate (TOPROL XL) 25 MG 24 hr tablet Take 1 tablet (25 mg total) by mouth daily. 30 tablet 11   nitroGLYCERIN (NITROSTAT) 0.4 MG SL tablet Place 1 tablet (0.4 mg total) under the tongue every 5 (five) minutes x 3 doses as needed for chest pain. 25 tablet 0   ondansetron (ZOFRAN-ODT) 4 MG disintegrating tablet DISSOLVE 1 TABLET IN MOUTH UP TO EVERY 8 HOURS AS NEEDED FOR NAUSEA 90 tablet 3   pantoprazole (PROTONIX) 40 MG tablet Take 40 mg by mouth.      potassium chloride SA (KLOR-CON M) 20 MEQ tablet Take 20 mEq by mouth daily.     ranolazine (RANEXA) 500 MG 12 hr tablet TAKE 1 TABLET TWICE DAILY 180 tablet 3   RYBELSUS 3 MG TABS TAKE 1 TABLET EVERY DAY 90 tablet 3   sucralfate (CARAFATE) 1 g tablet TAKE 1 TABLET BY MOUTH 4 TIMES DAILY. 360 tablet 1   Vitamin D, Ergocalciferol, (DRISDOL) 1.25 MG (50000 UNIT) CAPS capsule Take 1 capsule (50,000 Units total) by mouth every 7 (seven) days. 12 capsule 1   Zinc Oxide 16 % OINT Apply 1 Application topically 4 (four) times daily as needed. 454 g 3   No current facility-administered medications for this visit.    Allergies:   Lisinopril, Meloxicam, Tramadol, Amoxicillin, Amoxicillin-pot  clavulanate, and Clavulanic acid    Social History:   reports that she quit smoking about 25 years ago. Her smoking use included cigarettes. She has never used smokeless tobacco. She reports that she does not drink alcohol and does not use drugs.   Family History:  family history includes COPD in her brother; Cirrhosis in her father; Coronary artery disease in her brother, father, and sister; Diabetes type II in her father; Heart failure in her mother; Hypertension in her father; Kidney disease in her father; Pulmonary embolism in her mother; Stroke in her sister.    ROS:     Review of Systems  Constitutional: Negative.   HENT: Negative.  Eyes: Negative.   Respiratory: Negative.    Gastrointestinal:  Positive for abdominal pain.  Genitourinary: Negative.   Musculoskeletal: Negative.   Skin: Negative.   Neurological: Negative.   Endo/Heme/Allergies: Negative.   Psychiatric/Behavioral: Negative.    All other systems reviewed and are negative.     All other systems are reviewed and negative.    PHYSICAL EXAM: VS:  BP (!) 144/78   Pulse (!) 58   Ht 5\' 3"  (1.6 m)   Wt 130 lb 6.4 oz (59.1 kg)   SpO2 95%   BMI 23.10 kg/m  , BMI Body mass index is 23.1 kg/m. Last weight:  Wt Readings from Last 3 Encounters:  11/03/23 130 lb 6.4 oz (59.1 kg)  09/15/23 128 lb 9.6 oz (58.3 kg)  09/04/23 129 lb 6.4 oz (58.7 kg)     Physical Exam Constitutional:      Appearance: Normal appearance.  Cardiovascular:     Rate and Rhythm: Normal rate and regular rhythm.     Heart sounds: Normal heart sounds.  Pulmonary:     Effort: Pulmonary effort is normal.     Breath sounds: Normal breath sounds.  Musculoskeletal:     Right lower leg: No edema.     Left lower leg: No edema.  Neurological:     Mental Status: She is alert.       EKG:   Recent Labs: 02/18/2023: B Natriuretic Peptide 16.0; Magnesium 1.7 07/29/2023: Hemoglobin 12.6; Platelets 341 08/15/2023: TSH 1.260 09/15/2023: ALT 16;  BUN 12; Creatinine, Ser 1.21; Potassium 3.3; Sodium 138    Lipid Panel    Component Value Date/Time   CHOL 152 08/15/2023 0944   TRIG 73 08/15/2023 0944   HDL 50 08/15/2023 0944   CHOLHDL 3.0 08/15/2023 0944   CHOLHDL 4.0 07/04/2022 0623   VLDL 14 07/04/2022 0623   LDLCALC 88 08/15/2023 0944      Other studies Reviewed: Additional studies/ records that were reviewed today include:  Review of the above records demonstrates:       No data to display            ASSESSMENT AND PLAN:    ICD-10-CM   1. Pain of upper abdomen  R10.10 Urinalysis, Routine w reflex microscopic    Urine Culture    CT ABDOMEN PELVIS WO CONTRAST    2. Benign essential hypertension  I10 Urinalysis, Routine w reflex microscopic    Urine Culture    CT ABDOMEN PELVIS WO CONTRAST    3. Coronary graft stenosis, subsequent encounter  T82.857D Urinalysis, Routine w reflex microscopic    Urine Culture    CT ABDOMEN PELVIS WO CONTRAST    4. Mixed hyperlipidemia  E78.2 Urinalysis, Routine w reflex microscopic    Urine Culture    CT ABDOMEN PELVIS WO CONTRAST    5. Peripheral vascular disease (HCC)  I73.9 Urinalysis, Routine w reflex microscopic    Urine Culture    CT ABDOMEN PELVIS WO CONTRAST       Problem List Items Addressed This Visit       Cardiovascular and Mediastinum   Benign essential hypertension   Relevant Orders   Urinalysis, Routine w reflex microscopic   Urine Culture   CT ABDOMEN PELVIS WO CONTRAST   Coronary graft stenosis   Relevant Orders   Urinalysis, Routine w reflex microscopic   Urine Culture   CT ABDOMEN PELVIS WO CONTRAST   Peripheral vascular disease (HCC)   Relevant Orders   Urinalysis, Routine w  reflex microscopic   Urine Culture   CT ABDOMEN PELVIS WO CONTRAST     Other   Mixed hyperlipidemia   Relevant Orders   Urinalysis, Routine w reflex microscopic   Urine Culture   CT ABDOMEN PELVIS WO CONTRAST   Other Visit Diagnoses     Pain of upper abdomen     -  Primary   Relevant Orders   Urinalysis, Routine w reflex microscopic   Urine Culture   CT ABDOMEN PELVIS WO CONTRAST          Disposition:   Return in about 4 weeks (around 12/01/2023) for ct abdome, and f/u .    Total time spent: 30 minutes  Signed,  Adrian Blackwater, MD  11/03/2023 9:30 AM    [facility]

## 2023-11-04 LAB — URINALYSIS, ROUTINE W REFLEX MICROSCOPIC
Bilirubin, UA: NEGATIVE
Glucose, UA: NEGATIVE
Ketones, UA: NEGATIVE
Nitrite, UA: NEGATIVE
Protein,UA: NEGATIVE
RBC, UA: NEGATIVE
Specific Gravity, UA: 1.012 (ref 1.005–1.030)
Urobilinogen, Ur: 0.2 mg/dL (ref 0.2–1.0)
pH, UA: 7.5 (ref 5.0–7.5)

## 2023-11-04 LAB — MICROSCOPIC EXAMINATION
Bacteria, UA: NONE SEEN
Casts: NONE SEEN /[LPF]
RBC, Urine: NONE SEEN /[HPF] (ref 0–2)
WBC, UA: NONE SEEN /[HPF] (ref 0–5)

## 2023-11-07 ENCOUNTER — Encounter: Payer: Self-pay | Admitting: Cardiovascular Disease

## 2023-11-07 ENCOUNTER — Other Ambulatory Visit: Payer: Self-pay | Admitting: Cardiovascular Disease

## 2023-11-07 DIAGNOSIS — N3000 Acute cystitis without hematuria: Secondary | ICD-10-CM

## 2023-11-07 LAB — URINE CULTURE

## 2023-11-07 MED ORDER — CIPROFLOXACIN HCL 500 MG PO TABS
500.0000 mg | ORAL_TABLET | Freq: Two times a day (BID) | ORAL | 0 refills | Status: AC
Start: 2023-11-07 — End: 2023-11-14

## 2023-11-13 ENCOUNTER — Ambulatory Visit (INDEPENDENT_AMBULATORY_CARE_PROVIDER_SITE_OTHER): Payer: Medicare HMO | Admitting: Family

## 2023-11-13 ENCOUNTER — Encounter: Payer: Self-pay | Admitting: Family

## 2023-11-13 VITALS — BP 124/74 | HR 79 | Ht 63.0 in | Wt 127.0 lb

## 2023-11-13 DIAGNOSIS — E1165 Type 2 diabetes mellitus with hyperglycemia: Secondary | ICD-10-CM | POA: Diagnosis not present

## 2023-11-13 DIAGNOSIS — E559 Vitamin D deficiency, unspecified: Secondary | ICD-10-CM | POA: Diagnosis not present

## 2023-11-13 DIAGNOSIS — E538 Deficiency of other specified B group vitamins: Secondary | ICD-10-CM | POA: Diagnosis not present

## 2023-11-13 DIAGNOSIS — E782 Mixed hyperlipidemia: Secondary | ICD-10-CM | POA: Diagnosis not present

## 2023-11-13 DIAGNOSIS — R5383 Other fatigue: Secondary | ICD-10-CM

## 2023-11-13 DIAGNOSIS — R112 Nausea with vomiting, unspecified: Secondary | ICD-10-CM

## 2023-11-13 DIAGNOSIS — E039 Hypothyroidism, unspecified: Secondary | ICD-10-CM | POA: Diagnosis not present

## 2023-11-13 DIAGNOSIS — R42 Dizziness and giddiness: Secondary | ICD-10-CM

## 2023-11-13 DIAGNOSIS — R1010 Upper abdominal pain, unspecified: Secondary | ICD-10-CM | POA: Diagnosis not present

## 2023-11-13 DIAGNOSIS — I10 Essential (primary) hypertension: Secondary | ICD-10-CM

## 2023-11-13 NOTE — Progress Notes (Unsigned)
Established Patient Office Visit  Subjective:  Patient ID: Kayla Malone, female    DOB: 08/21/1961  Age: 62 y.o. MRN: [id number]  Chief Complaint  Patient presents with   Follow-up    3 month follow up    Patient is here today for her 3 months follow up.  She has been feeling poorly since last appointment.   She does have additional concerns to discuss today.  She has continued to have significant issues with nausea and vomiting.  Says that as soon as she takes her meds, she has nausea and needs to vomit. It does not matter which med she takes first, She will immediately need to throw it up.   She did have her EGD and it was completely normal.   Labs are due today. She needs refills.   I have reviewed her active problem list, medication list, allergies, health maintenance, notes from last encounter, lab results for her appointment today.      No other concerns at this time.   Past Medical History:  Diagnosis Date   Adenomatous colon polyp 02/20/2014   Asthma    Benign neoplasm of colon 02/20/2014   Chest pain 09/13/2013   Coronary angioplasty status 11/13/2018   Coronary artery disease    Depression    Diabetes mellitus without complication (HCC)    GERD (gastroesophageal reflux disease)    History of hiatal hernia    Hypertension    Hypokalemia 05/29/2019   Hypomagnesemia 07/02/2022   Nausea with vomiting, unspecified 06/07/2019   NSTEMI (non-ST elevated myocardial infarction) (HCC) 09/23/2018   Pain    BACK   Stroke (HCC)    x3   Thyroid cyst    TIA (transient ischemic attack) 08/06/2017   Unstable angina (HCC) 10/20/2017   Weight loss 06/07/2019    Past Surgical History:  Procedure Laterality Date   ABDOMINAL HYSTERECTOMY     CARDIAC SURGERY     CATARACT EXTRACTION W/PHACO Left 08/15/2017   Procedure: CATARACT EXTRACTION PHACO AND INTRAOCULAR LENS PLACEMENT (IOC);  Surgeon: Galen Manila, MD;  Location: [facility] ORS;  Service: Ophthalmology;   Laterality: Left;  [place] 00:40.6 AP% 7.3 CDE 2.98 FLUID PACK LOT # [id number]   CORONARY ARTERY BYPASS GRAFT N/A 10/23/2017   Procedure: CORONARY ARTERY BYPASS GRAFTING (CABG) x two  , using left internal mammary artery and right leg greater saphenous vein harvested  endoscopically - LIMA to LAD, SVG to RCA;  Surgeon: Delight Ovens, MD;  Location: [facility] OR;  Service: Open Heart Surgery;  Laterality: N/A;   CORONARY STENT INTERVENTION N/A 09/24/2018   Procedure: CORONARY STENT INTERVENTION;  Surgeon: Corky Crafts, MD;  Location: [facility] INVASIVE CV LAB;  Service: Cardiovascular;  Laterality: N/A;   ESOPHAGOGASTRODUODENOSCOPY N/A 05/30/2019   Procedure: ESOPHAGOGASTRODUODENOSCOPY (EGD);  Surgeon: Toledo, Boykin Nearing, MD;  Location: [facility] ENDOSCOPY;  Service: Gastroenterology;  Laterality: N/A;   EYE SURGERY     LEFT HEART CATH AND CORONARY ANGIOGRAPHY Left 10/09/2017   Procedure: LEFT HEART CATH AND CORONARY ANGIOGRAPHY;  Surgeon: Laurier Nancy, MD;  Location: [facility] INVASIVE CV LAB;  Service: Cardiovascular;  Laterality: Left;   LEFT HEART CATH AND CORS/GRAFTS ANGIOGRAPHY N/A 09/24/2018   Procedure: LEFT HEART CATH AND CORS/GRAFTS ANGIOGRAPHY;  Surgeon: Corky Crafts, MD;  Location: [facility] INVASIVE CV LAB;  Service: Cardiovascular;  Laterality: N/A;   LEFT HEART CATH AND CORS/GRAFTS ANGIOGRAPHY N/A 07/04/2022   Procedure: LEFT HEART CATH AND CORS/GRAFTS ANGIOGRAPHY;  Surgeon: Laurier Nancy, MD;  Location: [facility] INVASIVE CV LAB;  Service: Cardiovascular;  Laterality: N/A;   TEE WITHOUT CARDIOVERSION N/A 10/23/2017   Procedure: TRANSESOPHAGEAL ECHOCARDIOGRAM (TEE);  Surgeon: Delight Ovens, MD;  Location: [facility] OR;  Service: Open Heart Surgery;  Laterality: N/A;    Social History   Socioeconomic History   Marital status: Married    Spouse name: Not on file   Number of children: Not on file   Years of education: Not on file   Highest education level: Not on file  Occupational History   Not  on file  Tobacco Use   Smoking status: Former    Current packs/day: 0.00    Types: Cigarettes    Quit date: 09/13/1998    Years since quitting: 25.1   Smokeless tobacco: Never  Vaping Use   Vaping status: Never Used  Substance and Sexual Activity   Alcohol use: No   Drug use: No   Sexual activity: Yes    Birth control/protection: Post-menopausal  Other Topics Concern   Not on file  Social History Narrative   Not on file   Social Determinants of Health   Financial Resource Strain: Not on file  Food Insecurity: No Food Insecurity (10/11/2019)   Received [facility], [facility]   Hunger Vital Sign    Worried About Running Out of Food in the Last Year: Never true    Ran Out of Food in the Last Year: Never true  Transportation Needs: Not on file  Physical Activity: Not on file  Stress: Not on file  Social Connections: Not on file  Intimate Partner Violence: Not on file    Family History  Problem Relation Age of Onset   Heart failure Mother    Pulmonary embolism Mother    Cirrhosis Father    Coronary artery disease Father    Diabetes type II Father    Hypertension Father    Kidney disease Father    Coronary artery disease Sister    Stroke Sister    COPD Brother    Coronary artery disease Brother    Breast cancer Neg Hx     Allergies  Allergen Reactions   Lisinopril Swelling and Shortness Of Breath   Meloxicam Nausea Only and Nausea And Vomiting   Tramadol Nausea Only and Other (See Comments)    sleepiness  Other reaction(s): Dizziness, Other, Other (See Comments)  somnolence  Other reaction(s): Other (See Comments), Other (See Comments)  Would not wake up after taking this medication   Amoxicillin Rash    Don't remember  Don't remember  Don't remember    Amoxicillin-Pot Clavulanate Hives and Rash   Clavulanic Acid Rash    Review of Systems  Gastrointestinal:  Positive for abdominal pain, nausea and vomiting.  All other systems reviewed  and are negative.      Objective:   BP 124/74   Pulse 79   Ht 5\' 3"  (1.6 m)   Wt 127 lb (57.6 kg)   SpO2 97%   BMI 22.50 kg/m   Vitals:   11/13/23 0912  BP: 124/74  Pulse: 79  Height: 5\' 3"  (1.6 m)  Weight: 127 lb (57.6 kg)  SpO2: 97%  BMI (Calculated): 22.5    Physical Exam Vitals and nursing note reviewed.  Constitutional:      Appearance: Normal appearance. She is normal weight.  HENT:     Head: Normocephalic and atraumatic.  Eyes:     Extraocular Movements: Extraocular movements intact.  Conjunctiva/sclera: Conjunctivae normal.     Pupils: Pupils are equal, round, and reactive to light.  Cardiovascular:     Rate and Rhythm: Normal rate. Rhythm irregular.     Pulses: Normal pulses.  Pulmonary:     Effort: Pulmonary effort is normal.  Abdominal:     Tenderness: There is abdominal tenderness.  Neurological:     General: No focal deficit present.     Mental Status: She is alert and oriented to person, place, and time. Mental status is at baseline.  Psychiatric:        Mood and Affect: Mood normal.        Behavior: Behavior normal.        Thought Content: Thought content normal.        Judgment: Judgment normal.      No results found for any visits on 11/13/23.  Recent Results (from the past 2160 hour(s))  POCT CBG (Fasting - Glucose)     Status: Abnormal   Collection Time: 09/15/23 11:36 AM  Result Value Ref Range   Glucose Fasting, POC 186 (A) 70 - 99 mg/dL  [id number]     Status: Abnormal   Collection Time: 09/15/23 12:13 PM  Result Value Ref Range   Glucose 131 (H) 70 - 99 mg/dL   BUN 12 8 - 27 mg/dL   Creatinine, Ser 4.09 (H) 0.57 - 1.00 mg/dL   eGFR 51 (L) >81 XB/JYN/8.29   BUN/Creatinine Ratio 10 (L) 12 - 28   Sodium 138 134 - 144 mmol/L   Potassium 3.3 (L) 3.5 - 5.2 mmol/L   Chloride 97 96 - 106 mmol/L   CO2 24 20 - 29 mmol/L   Calcium 9.1 8.7 - 10.3 mg/dL   Total Protein 6.4 6.0 - 8.5 g/dL   Albumin 4.2 3.9 - 4.9 g/dL   Globulin,  Total 2.2 1.5 - 4.5 g/dL   Bilirubin Total 0.6 0.0 - 1.2 mg/dL   Alkaline Phosphatase 99 44 - 121 IU/L   AST 17 0 - 40 IU/L   ALT 16 0 - 32 IU/L  Pancreatic Elastase, Fecal     Status: None   Collection Time: 09/18/23 10:24 AM  Result Value Ref Range   Pancreatic Elastase, Fecal >800 >200 ug Elast./g    Comment:        Severe Pancreatic Insufficiency:          <100        Moderate Pancreatic Insufficiency:   100 - 200        Normal:                                   >200   Cdiff NAA+O+P+Stool Culture     Status: None   Collection Time: 09/18/23 10:39 AM   Specimen: Stool   ST  Result Value Ref Range   Salmonella/Shigella Screen Final report    Stool Culture result 1 (RSASHR) Comment     Comment: No Salmonella or Shigella recovered.   Campylobacter Culture Final report    Stool Culture result 1 (CMPCXR) Comment     Comment: No Campylobacter species isolated.   E coli, Shiga toxin Assay Negative Negative   OVA + PARASITE EXAM Final report     Comment: These results were obtained using wet preparation(s) and trichrome stained smear. This test does not include testing for Cryptosporidium parvum, Cyclospora, or Microsporidia.    O&P result 1  Comment     Comment: No ova, cysts, or parasites seen. One negative specimen does not rule out the possibility of a parasitic infection.    Toxigenic C. Difficile by PCR Negative Negative  Culture, Stool     Status: None   Collection Time: 09/18/23 10:40 AM   Specimen: Stool   ST     CD- [id number]  Result Value Ref Range   Salmonella/Shigella Screen Final report    Stool Culture result 1 (RSASHR) Comment     Comment: No Salmonella or Shigella recovered.   Campylobacter Culture Final report    Stool Culture result 1 (CMPCXR) Comment     Comment: No Campylobacter species isolated.   E coli, Shiga toxin Assay Negative Negative  H. pylori antigen, stool     Status: None   Collection Time: 09/18/23 10:40 AM  Result Value Ref Range   H  pylori Ag, Stl Negative Negative  Urinalysis, Routine w reflex microscopic     Status: Abnormal   Collection Time: 11/03/23  9:44 AM  Result Value Ref Range   Specific Gravity, UA 1.012 1.005 - 1.030   pH, UA 7.5 5.0 - 7.5   Color, UA Yellow Yellow   Appearance Ur Clear Clear   Leukocytes,UA Trace (A) Negative   Protein,UA Negative Negative/Trace   Glucose, UA Negative Negative   Ketones, UA Negative Negative   RBC, UA Negative Negative   Bilirubin, UA Negative Negative   Urobilinogen, Ur 0.2 0.2 - 1.0 mg/dL   Nitrite, UA Negative Negative   Microscopic Examination See below:     Comment: Microscopic was indicated and was performed.  Microscopic Examination     Status: None   Collection Time: 11/03/23  9:44 AM  Result Value Ref Range   WBC, UA None seen 0 - 5 /hpf   RBC, Urine None seen 0 - 2 /hpf   Epithelial Cells (non renal) 0-10 0 - 10 /hpf   Casts None seen None seen /lpf   Bacteria, UA None seen None seen/Few  Urine Culture     Status: Abnormal   Collection Time: 11/03/23 10:49 AM   Specimen: Urine, Clean Catch   UC  Result Value Ref Range   Urine Culture, Routine Final report (A)    Organism ID, Bacteria Enterococcus faecalis (A)     Comment: For Enterococcus species, aminoglycosides (except for high-level resistance screening), cephalosporins, clindamycin, and trimethoprim-sulfamethoxazole are not effective clinically. (CLSI, M100-S26, 2016) 10,000-25,000 colony forming units per mL    Antimicrobial Susceptibility Comment     Comment:       ** S = Susceptible; I = Intermediate; R = Resistant **                    P = Positive; N = Negative             MICS are expressed in micrograms per mL    Antibiotic                 RSLT#1    RSLT#2    RSLT#3    RSLT#4 Ciprofloxacin                  S Levofloxacin                   S Nitrofurantoin                 S Penicillin  S Tetracycline                   S Vancomycin                     S         Assessment & Plan:   Problem List Items Addressed This Visit       Active Problems   Mixed hyperlipidemia    Checking labs today.  Continue current therapy for lipid control. Will modify as needed based on labwork results.       Relevant Orders   Lipid panel   CMP14+EGFR   CBC with Diff   Benign essential hypertension - Primary    BP well controlled on current regimen.  Will send refills to pharmacy.   Labs checked in office today.       Relevant Orders   CMP14+EGFR   CBC with Diff   Type 2 diabetes mellitus with hyperglycemia (HCC)    Checking labs today. Will call pt. With results  Continue current diabetes POC, as patient has been well controlled on current regimen.  Will adjust meds if needed based on labs.       Relevant Orders   VITAMIN D 25 Hydroxy (Vit-D Deficiency, Fractures)   CMP14+EGFR   Hemoglobin A1c   CBC with Diff   Vitamin D deficiency, unspecified    Checking labs today.  Will continue supplements as needed.       Relevant Orders   CMP14+EGFR   CBC with Diff   Dizziness    Patient stable.  Well controlled with current therapy.   Continue current meds.       Relevant Orders   CMP14+EGFR   CBC with Diff   Other Visit Diagnoses     B12 deficiency due to diet       Checking labs today.  Will continue supplements as needed   Relevant Orders   CMP14+EGFR   Vitamin B12   CBC with Diff   Hypothyroidism (acquired)       Patient stable.  Well controlled with current therapy.  Continue current meds.   Relevant Orders   CMP14+EGFR   TSH   CBC with Diff   Other fatigue       Relevant Orders   CMP14+EGFR   CBC with Diff   Iron, TIBC and Ferritin Panel   Pain of upper abdomen       Nausea and vomiting, unspecified vomiting type       CT scan scheduled for tomorrow Awaiting results for further decisions on treatment plan. Will discuss at follow-up appointment       Return in about 2 weeks (around 11/27/2023) for AWV.    Total time spent: 30 minutes  Miki Kins, FNP  11/13/2023   This document may have been prepared by [facility] Voice Recognition software and as such may include unintentional dictation errors.

## 2023-11-14 ENCOUNTER — Other Ambulatory Visit: Payer: Medicare HMO

## 2023-11-14 ENCOUNTER — Ambulatory Visit: Payer: Medicare HMO

## 2023-11-14 ENCOUNTER — Encounter: Payer: Self-pay | Admitting: Family

## 2023-11-14 ENCOUNTER — Other Ambulatory Visit: Payer: Self-pay | Admitting: Family

## 2023-11-14 DIAGNOSIS — R1010 Upper abdominal pain, unspecified: Secondary | ICD-10-CM

## 2023-11-14 DIAGNOSIS — E559 Vitamin D deficiency, unspecified: Secondary | ICD-10-CM | POA: Diagnosis not present

## 2023-11-14 DIAGNOSIS — R5383 Other fatigue: Secondary | ICD-10-CM | POA: Diagnosis not present

## 2023-11-14 DIAGNOSIS — R42 Dizziness and giddiness: Secondary | ICD-10-CM | POA: Diagnosis not present

## 2023-11-14 DIAGNOSIS — E039 Hypothyroidism, unspecified: Secondary | ICD-10-CM | POA: Diagnosis not present

## 2023-11-14 DIAGNOSIS — E782 Mixed hyperlipidemia: Secondary | ICD-10-CM

## 2023-11-14 DIAGNOSIS — E1165 Type 2 diabetes mellitus with hyperglycemia: Secondary | ICD-10-CM | POA: Diagnosis not present

## 2023-11-14 DIAGNOSIS — E538 Deficiency of other specified B group vitamins: Secondary | ICD-10-CM | POA: Diagnosis not present

## 2023-11-14 DIAGNOSIS — I10 Essential (primary) hypertension: Secondary | ICD-10-CM

## 2023-11-14 DIAGNOSIS — T82857D Stenosis of cardiac prosthetic devices, implants and grafts, subsequent encounter: Secondary | ICD-10-CM

## 2023-11-14 DIAGNOSIS — I739 Peripheral vascular disease, unspecified: Secondary | ICD-10-CM

## 2023-11-14 NOTE — Assessment & Plan Note (Addendum)
Checking labs today.  Continue current therapy for lipid control. Will modify as needed based on labwork results.  

## 2023-11-14 NOTE — Assessment & Plan Note (Addendum)
Checking labs today. Will call pt. With results  Continue current diabetes POC, as patient has been well controlled on current regimen.  Will adjust meds if needed based on labs.  

## 2023-11-14 NOTE — Assessment & Plan Note (Signed)
Patient stable.  Well controlled with current therapy.   Continue current meds.  

## 2023-11-14 NOTE — Assessment & Plan Note (Addendum)
BP well controlled on current regimen.  Will send refills to pharmacy.   Labs checked in office today.  

## 2023-11-14 NOTE — Assessment & Plan Note (Addendum)
Checking labs today.  Will continue supplements as needed.  

## 2023-11-15 LAB — LIPID PANEL
Chol/HDL Ratio: 2.8 {ratio} (ref 0.0–4.4)
Cholesterol, Total: 160 mg/dL (ref 100–199)
HDL: 58 mg/dL (ref >39)
LDL Chol Calc (NIH): 90 mg/dL (ref 0–99)
Triglycerides: 57 mg/dL (ref 0–149)
VLDL Cholesterol Cal: 12 mg/dL (ref 5–40)

## 2023-11-15 LAB — CBC WITH DIFFERENTIAL/PLATELET
Basophils Absolute: 0 10*3/uL (ref 0.0–0.2)
Basos: 1 %
EOS (ABSOLUTE): 0 10*3/uL (ref 0.0–0.4)
Eos: 1 %
Hematocrit: 41.3 % (ref 34.0–46.6)
Hemoglobin: 13.4 g/dL (ref 11.1–15.9)
Immature Grans (Abs): 0 10*3/uL (ref 0.0–0.1)
Immature Granulocytes: 0 %
Lymphocytes Absolute: 1.1 10*3/uL (ref 0.7–3.1)
Lymphs: 31 %
MCH: 28.8 pg (ref 26.6–33.0)
MCHC: 32.4 g/dL (ref 31.5–35.7)
MCV: 89 fL (ref 79–97)
Monocytes Absolute: 0.3 10*3/uL (ref 0.1–0.9)
Monocytes: 7 %
Neutrophils Absolute: 2.1 10*3/uL (ref 1.4–7.0)
Neutrophils: 60 %
Platelets: 351 10*3/uL (ref 150–450)
RBC: 4.66 x10E6/uL (ref 3.77–5.28)
RDW: 12.5 % (ref 11.7–15.4)
WBC: 3.6 10*3/uL (ref 3.4–10.8)

## 2023-11-15 LAB — CMP14+EGFR
ALT: 10 [IU]/L (ref 0–32)
AST: 16 [IU]/L (ref 0–40)
Albumin: 4.2 g/dL (ref 3.9–4.9)
Alkaline Phosphatase: 75 [IU]/L (ref 44–121)
BUN/Creatinine Ratio: 8 — ABNORMAL LOW (ref 12–28)
BUN: 9 mg/dL (ref 8–27)
Bilirubin Total: 0.6 mg/dL (ref 0.0–1.2)
CO2: 29 mmol/L (ref 20–29)
Calcium: 9.7 mg/dL (ref 8.7–10.3)
Chloride: 97 mmol/L (ref 96–106)
Creatinine, Ser: 1.08 mg/dL — ABNORMAL HIGH (ref 0.57–1.00)
Globulin, Total: 2.6 g/dL (ref 1.5–4.5)
Glucose: 103 mg/dL — ABNORMAL HIGH (ref 70–99)
Potassium: 3.3 mmol/L — ABNORMAL LOW (ref 3.5–5.2)
Sodium: 141 mmol/L (ref 134–144)
Total Protein: 6.8 g/dL (ref 6.0–8.5)
eGFR: 58 mL/min/{1.73_m2} — ABNORMAL LOW (ref >59)

## 2023-11-15 LAB — IRON,TIBC AND FERRITIN PANEL
Ferritin: 57 ng/mL (ref 15–150)
Iron Saturation: 23 % (ref 15–55)
Iron: 71 ug/dL (ref 27–139)
Total Iron Binding Capacity: 308 ug/dL (ref 250–450)
UIBC: 237 ug/dL (ref 118–369)

## 2023-11-15 LAB — VITAMIN B12: Vitamin B-12: >2000 pg/mL — ABNORMAL HIGH (ref 232–1245)

## 2023-11-15 LAB — HEMOGLOBIN A1C
Est. average glucose Bld gHb Est-mCnc: 137 mg/dL
Hgb A1c MFr Bld: 6.4 % — ABNORMAL HIGH (ref 4.8–5.6)

## 2023-11-15 LAB — TSH: TSH: 0.738 u[IU]/mL (ref 0.450–4.500)

## 2023-11-15 LAB — VITAMIN D 25 HYDROXY (VIT D DEFICIENCY, FRACTURES): Vit D, 25-Hydroxy: 43.3 ng/mL (ref 30.0–100.0)

## 2023-11-17 ENCOUNTER — Other Ambulatory Visit: Payer: Self-pay | Admitting: Family

## 2023-11-20 ENCOUNTER — Encounter: Payer: Self-pay | Admitting: Cardiovascular Disease

## 2023-11-27 ENCOUNTER — Other Ambulatory Visit: Payer: Self-pay | Admitting: Internal Medicine

## 2023-11-27 DIAGNOSIS — E1165 Type 2 diabetes mellitus with hyperglycemia: Secondary | ICD-10-CM

## 2023-11-28 ENCOUNTER — Ambulatory Visit: Payer: Medicare HMO | Admitting: Cardiovascular Disease

## 2023-11-30 ENCOUNTER — Ambulatory Visit (INDEPENDENT_AMBULATORY_CARE_PROVIDER_SITE_OTHER): Payer: Medicare HMO | Admitting: Family

## 2023-11-30 ENCOUNTER — Other Ambulatory Visit: Payer: Self-pay | Admitting: Family

## 2023-11-30 ENCOUNTER — Encounter: Payer: Self-pay | Admitting: Family

## 2023-11-30 VITALS — BP 122/83 | HR 65 | Ht 63.0 in | Wt 129.0 lb

## 2023-11-30 DIAGNOSIS — E1165 Type 2 diabetes mellitus with hyperglycemia: Secondary | ICD-10-CM

## 2023-11-30 DIAGNOSIS — Z0000 Encounter for general adult medical examination without abnormal findings: Secondary | ICD-10-CM

## 2023-11-30 DIAGNOSIS — M79621 Pain in right upper arm: Secondary | ICD-10-CM | POA: Diagnosis not present

## 2023-11-30 DIAGNOSIS — Z0130 Encounter for examination of blood pressure without abnormal findings: Secondary | ICD-10-CM

## 2023-12-05 ENCOUNTER — Ambulatory Visit: Payer: Medicare HMO | Admitting: Family

## 2023-12-10 ENCOUNTER — Encounter: Payer: Self-pay | Admitting: Family

## 2023-12-10 NOTE — Progress Notes (Signed)
Annual Wellness Visit  Patient: Kayla Malone, Female    DOB: 1961/06/22, 62 y.o.   MRN: [id number] Visit Date: 12/10/2023  Today's Provider: Miki Kins, FNP  Subjective:    Chief Complaint  Patient presents with   Follow-up    Under arms towards her back very sore right side worse then left   Kayla Malone is a 62 y.o. female who presents today for her Annual Wellness Visit.  She is also having pain in her axillae, right > left.  She asks if we can get her an ultrasound to evaluate.   Due for her eye exam.   No other concerns today.    Past Medical History:  Diagnosis Date   Adenomatous colon polyp 02/20/2014   Asthma    Benign neoplasm of colon 02/20/2014   Chest pain 09/13/2013   Coronary angioplasty status 11/13/2018   Coronary artery disease    Depression    Diabetes mellitus without complication (HCC)    GERD (gastroesophageal reflux disease)    History of hiatal hernia    Hypertension    Hypokalemia 05/29/2019   Hypomagnesemia 07/02/2022   Nausea with vomiting, unspecified 06/07/2019   NSTEMI (non-ST elevated myocardial infarction) (HCC) 09/23/2018   Pain    BACK   Stroke (HCC)    x3   Thyroid cyst    TIA (transient ischemic attack) 08/06/2017   Unstable angina (HCC) 10/20/2017   Weight loss 06/07/2019   Past Surgical History:  Procedure Laterality Date   ABDOMINAL HYSTERECTOMY     CARDIAC SURGERY     CATARACT EXTRACTION W/PHACO Left 08/15/2017   Procedure: CATARACT EXTRACTION PHACO AND INTRAOCULAR LENS PLACEMENT (IOC);  Surgeon: Galen Manila, MD;  Location: [facility] ORS;  Service: Ophthalmology;  Laterality: Left;  [place] 00:40.6 AP% 7.3 CDE 2.98 FLUID PACK LOT # [id number]   CORONARY ARTERY BYPASS GRAFT N/A 10/23/2017   Procedure: CORONARY ARTERY BYPASS GRAFTING (CABG) x two  , using left internal mammary artery and right leg greater saphenous vein harvested  endoscopically - LIMA to LAD, SVG to RCA;  Surgeon: Delight Ovens,  MD;  Location: [facility] OR;  Service: Open Heart Surgery;  Laterality: N/A;   CORONARY STENT INTERVENTION N/A 09/24/2018   Procedure: CORONARY STENT INTERVENTION;  Surgeon: Corky Crafts, MD;  Location: [facility] INVASIVE CV LAB;  Service: Cardiovascular;  Laterality: N/A;   ESOPHAGOGASTRODUODENOSCOPY N/A 05/30/2019   Procedure: ESOPHAGOGASTRODUODENOSCOPY (EGD);  Surgeon: Toledo, Boykin Nearing, MD;  Location: [facility] ENDOSCOPY;  Service: Gastroenterology;  Laterality: N/A;   EYE SURGERY     LEFT HEART CATH AND CORONARY ANGIOGRAPHY Left 10/09/2017   Procedure: LEFT HEART CATH AND CORONARY ANGIOGRAPHY;  Surgeon: Laurier Nancy, MD;  Location: [facility] INVASIVE CV LAB;  Service: Cardiovascular;  Laterality: Left;   LEFT HEART CATH AND CORS/GRAFTS ANGIOGRAPHY N/A 09/24/2018   Procedure: LEFT HEART CATH AND CORS/GRAFTS ANGIOGRAPHY;  Surgeon: Corky Crafts, MD;  Location: [facility] INVASIVE CV LAB;  Service: Cardiovascular;  Laterality: N/A;   LEFT HEART CATH AND CORS/GRAFTS ANGIOGRAPHY N/A 07/04/2022   Procedure: LEFT HEART CATH AND CORS/GRAFTS ANGIOGRAPHY;  Surgeon: Laurier Nancy, MD;  Location: [facility] INVASIVE CV LAB;  Service: Cardiovascular;  Laterality: N/A;   TEE WITHOUT CARDIOVERSION N/A 10/23/2017   Procedure: TRANSESOPHAGEAL ECHOCARDIOGRAM (TEE);  Surgeon: Delight Ovens, MD;  Location: [facility] OR;  Service: Open Heart Surgery;  Laterality: N/A;   Family History  Problem Relation Age of Onset   Heart failure Mother  Pulmonary embolism Mother    Cirrhosis Father    Coronary artery disease Father    Diabetes type II Father    Hypertension Father    Kidney disease Father    Coronary artery disease Sister    Stroke Sister    COPD Brother    Coronary artery disease Brother    Breast cancer Neg Hx    Social History   Socioeconomic History   Marital status: Married    Spouse name: Not on file   Number of children: Not on file   Years of education: Not on file   Highest education level: Not on file   Occupational History   Not on file  Tobacco Use   Smoking status: Former    Current packs/day: 0.00    Types: Cigarettes    Quit date: 09/13/1998    Years since quitting: 25.2   Smokeless tobacco: Never  Vaping Use   Vaping status: Never Used  Substance and Sexual Activity   Alcohol use: No   Drug use: No   Sexual activity: Yes    Birth control/protection: Post-menopausal  Other Topics Concern   Not on file  Social History Narrative   Not on file   Social Drivers of Health   Financial Resource Strain: Low Risk  (12/10/2023)   Overall Financial Resource Strain (CARDIA)    Difficulty of Paying Living Expenses: Not hard at all  Food Insecurity: No Food Insecurity (11/30/2023)   Hunger Vital Sign    Worried About Running Out of Food in the Last Year: Never true    Ran Out of Food in the Last Year: Never true  Transportation Needs: No Transportation Needs (12/10/2023)   PRAPARE - Administrator, Civil Service (Medical): No    Lack of Transportation (Non-Medical): No  Physical Activity: Insufficiently Active (11/30/2023)   Exercise Vital Sign    Days of Exercise per Week: 5 days    Minutes of Exercise per Session: 10 min  Stress: No Stress Concern Present (11/30/2023)   [employer] of Occupational Health - Occupational Stress Questionnaire    Feeling of Stress : Not at all  Social Connections: Moderately Isolated (12/10/2023)   Social Connection and Isolation Panel [NHANES]    Frequency of Communication with Friends and Family: More than three times a week    Frequency of Social Gatherings with Friends and Family: More than three times a week    Attends Religious Services: More than 4 times per year    Active Member of [employer] or Organizations: No    Attends Banker Meetings: Never    Marital Status: Widowed  Intimate Partner Violence: Not At Risk (11/30/2023)   Humiliation, Afraid, Rape, and Kick questionnaire    Fear of Current or  Ex-Partner: No    Emotionally Abused: No    Physically Abused: No    Sexually Abused: No    Medications: Outpatient Medications Prior to Visit  Medication Sig   Accu-Chek Softclix Lancets lancets USE TO TEST SUGARS TWICE DAILY   acetaminophen (TYLENOL) 500 MG tablet Take 1,000 mg by mouth every 6 (six) hours as needed for mild pain or headache.   aspirin EC 81 MG tablet Take 81 mg by mouth daily.   atorvastatin (LIPITOR) 80 MG tablet TAKE 1 TABLET EVERY DAY   Blood Glucose Monitoring Suppl (ACCU-CHEK GUIDE ME) w/Device KIT    chlorthalidone (HYGROTON) 25 MG tablet TAKE 1 TABLET EVERY DAY   clopidogrel (PLAVIX) 75 MG  tablet TAKE 1 TABLET EVERY DAY   fluticasone (FLONASE) 50 MCG/ACT nasal spray USE 2 SPRAYS IN EACH NOSTRIL EVERY DAY   glucose blood (ACCU-CHEK GUIDE) test strip Blood Glucose Test In Vitro Strip QTY: 100 strip Days: 90 Refills: 3  Written: 06/07/19 Patient Instructions: E11.9 - to check blodd sugars once daily   isosorbide mononitrate (IMDUR) 30 MG 24 hr tablet TAKE 1 TABLET EVERY DAY   losartan (COZAAR) 25 MG tablet TAKE 1 TABLET (25 MG TOTAL) BY MOUTH DAILY.   methocarbamol (ROBAXIN) 500 MG tablet Take 500 mg by mouth at bedtime as needed for muscle spasms.   metoprolol succinate (TOPROL XL) 25 MG 24 hr tablet Take 1 tablet (25 mg total) by mouth daily.   nitroGLYCERIN (NITROSTAT) 0.4 MG SL tablet Place 1 tablet (0.4 mg total) under the tongue every 5 (five) minutes x 3 doses as needed for chest pain. (Patient not taking: Reported on 11/13/2023)   nitroGLYCERIN (NITROSTAT) 0.6 MG SL tablet USE 1 TABLET UNDER TONGUE AS NEEDED FOR CHEST PAIN. REPEAT IN 5 MIN IF NO RELIEF. IF MORE THAN 2 TABS NEEDED, CALL 911   ondansetron (ZOFRAN-ODT) 4 MG disintegrating tablet DISSOLVE 1 TABLET IN MOUTH UP TO EVERY 8 HOURS AS NEEDED FOR NAUSEA   pantoprazole (PROTONIX) 40 MG tablet Take 40 mg by mouth.    potassium chloride SA (KLOR-CON M) 20 MEQ tablet TAKE 1 TABLET EVERY DAY   ranolazine  (RANEXA) 500 MG 12 hr tablet TAKE 1 TABLET TWICE DAILY   RYBELSUS 3 MG TABS TAKE 1 TABLET EVERY DAY   sucralfate (CARAFATE) 1 g tablet TAKE 1 TABLET BY MOUTH 4 TIMES DAILY.   Zinc Oxide 16 % OINT Apply 1 Application topically 4 (four) times daily as needed.   No facility-administered medications prior to visit.    Allergies  Allergen Reactions   Lisinopril Swelling and Shortness Of Breath   Meloxicam Nausea Only and Nausea And Vomiting   Tramadol Nausea Only and Other (See Comments)    sleepiness  Other reaction(s): Dizziness, Other, Other (See Comments)  somnolence  Other reaction(s): Other (See Comments), Other (See Comments)  Would not wake up after taking this medication   Amoxicillin Rash    Don't remember  Don't remember  Don't remember    Amoxicillin-Pot Clavulanate Hives and Rash   Clavulanic Acid Rash    Patient Care Team: Miki Kins, FNP as PCP - General (Family Medicine) Laurier Nancy, MD as PCP - Cardiology (Cardiology) Laurier Nancy, MD as Consulting Physician (Cardiology)  Review of Systems  All other systems reviewed and are negative.      Objective:    Vitals: BP 122/83   Pulse 65   Ht 5\' 3"  (1.6 m)   Wt 129 lb (58.5 kg)   SpO2 98%   BMI 22.85 kg/m    Physical Exam Vitals and nursing note reviewed.  Constitutional:      Appearance: Normal appearance. She is normal weight.  HENT:     Head: Normocephalic.  Eyes:     Extraocular Movements: Extraocular movements intact.     Conjunctiva/sclera: Conjunctivae normal.     Pupils: Pupils are equal, round, and reactive to light.  Cardiovascular:     Rate and Rhythm: Normal rate.  Pulmonary:     Effort: Pulmonary effort is normal.  Neurological:     General: No focal deficit present.     Mental Status: She is alert and oriented to person, place, and time. Mental  status is at baseline.  Psychiatric:        Mood and Affect: Mood normal.        Behavior: Behavior normal.         Thought Content: Thought content normal.        Judgment: Judgment normal.     Most recent functional status assessment:     No data to display          Most recent fall risk assessment:    12/10/2023    4:57 PM  Fall Risk   Falls in the past year? 1  Number falls in past yr: 1  Injury with Fall? 0  Risk for fall due to : History of fall(s);Impaired mobility;Impaired balance/gait;Orthopedic patient  Follow up Falls evaluation completed;Education provided;Falls prevention discussed;Follow up appointment     Most recent depression screenings:    11/13/2023   10:42 AM 10/11/2018    1:43 PM  PHQ 2/9 Scores  PHQ - 2 Score 4 6  PHQ- 9 Score 17 17    Most recent cognitive screening:    12/10/2023    4:57 PM  6CIT Screen  What Year? 0 points  What month? 0 points  What time? 0 points  Count back from 20 0 points  Months in reverse 0 points  Repeat phrase 0 points  Total Score 0 points    No results found for any visits on 11/30/23.     Assessment & Plan:      Annual wellness visit done today including the all of the following: Reviewed patient's Family Medical History Reviewed and updated list of patient's medical providers Assessment of cognitive impairment was done Assessed patient's functional ability Established a written schedule for health screening services Health Risk Assessent Completed and Reviewed  Exercise Activities and Dietary recommendations  Goals      Activity and Exercise Increased     Evidence-based guidance:  Review current exercise levels.  Assess patient perspective on exercise or activity level, barriers to increasing activity, motivation and readiness for change.  Recommend or set healthy exercise goal based on individual tolerance.  Encourage small steps toward making change in amount of exercise or activity.  Urge reduction of sedentary activities or screen time.  Promote group activities within the community or with family or  support person.  Consider referral to rehabiliation therapist for assessment and exercise/activity plan.   Notes:      Healthy Nutrition Achieved     Evidence-based guidance:  Assess patient perspective on healthy weight, weight loss or weight gain, motivation and readiness for change.  Recommend or set healthy weight goal based on body mass index.  Review current dietary intake and exercise levels.  Encourage small steps toward making change to eating and exercising.  Provide individualized medical nutrition therapy.   Notes:         Immunization History  Administered Date(s) Administered   Influenza, Mdck, Trivalent,PF 6+ MOS(egg free) 09/15/2023   Influenza,inj,quad, With Preservative 01/12/2017   Influenza-Unspecified 06/17/2019   PFIZER(Purple Top)SARS-COV-2 Vaccination 04/05/2020, 04/26/2020   Pneumococcal Polysaccharide-23 09/29/2017   Tdap 06/18/2020    Health Maintenance  Topic Date Due   Zoster Vaccines- Shingrix (1 of 2) Never done   COVID-19 Vaccine (3 - 2024-25 season) 08/13/2023   OPHTHALMOLOGY EXAM  11/03/2023   Diabetic kidney evaluation - Urine ACR  02/09/2024   HEMOGLOBIN A1C  05/14/2024   FOOT EXAM  10/02/2024   Diabetic kidney evaluation - eGFR measurement  11/13/2024  MAMMOGRAM  11/15/2024   Medicare Annual Wellness (AWV)  11/29/2024   Colonoscopy  07/11/2028   DTaP/Tdap/Td (2 - Td or Tdap) 06/18/2030   INFLUENZA VACCINE  Completed   Hepatitis C Screening  Completed   HIV Screening  Completed   HPV VACCINES  Aged Out     Discussed health benefits of physical activity, and encouraged her to engage in regular exercise appropriate for her age and condition.         Miki Kins, FNP   11/30/2023  This document may have been prepared by Dragon Voice Recognition software and as such may include unintentional dictation errors.

## 2023-12-22 ENCOUNTER — Ambulatory Visit
Admission: RE | Admit: 2023-12-22 | Discharge: 2023-12-22 | Disposition: A | Discharge status: Home / Self Care | Payer: Medicare HMO | Source: Ambulatory Visit | Attending: Family | Admitting: Family

## 2023-12-22 DIAGNOSIS — M79621 Pain in right upper arm: Secondary | ICD-10-CM

## 2023-12-22 DIAGNOSIS — N6331 Unspecified lump in axillary tail of the right breast: Secondary | ICD-10-CM | POA: Diagnosis not present

## 2023-12-23 ENCOUNTER — Other Ambulatory Visit: Payer: Self-pay | Admitting: Family

## 2024-01-08 ENCOUNTER — Other Ambulatory Visit: Payer: Self-pay | Admitting: Family

## 2024-01-08 DIAGNOSIS — T82857D Stenosis of cardiac prosthetic devices, implants and grafts, subsequent encounter: Secondary | ICD-10-CM

## 2024-02-20 ENCOUNTER — Ambulatory Visit (INDEPENDENT_AMBULATORY_CARE_PROVIDER_SITE_OTHER): Admitting: Podiatry

## 2024-02-20 DIAGNOSIS — M722 Plantar fascial fibromatosis: Secondary | ICD-10-CM

## 2024-02-20 DIAGNOSIS — Q666 Other congenital valgus deformities of feet: Secondary | ICD-10-CM

## 2024-02-20 NOTE — Progress Notes (Signed)
 Subjective:  Patient ID: Kayla Malone, female    DOB: May 11, 1961,  MRN: [id number]  Chief Complaint  Patient presents with   Numbness    Pt stated that she is starting to have a lot of numbness and burning in her feet she stated that they get real cold     63 y.o. female presents with the above complaint.  Patient presents with bilateral heel pain that has gotten progressively worse worse with ambulation.  She would like to discuss treatment options for she has not seen anyone else prior to seeing me denies any other acute complaints hurts with ambulation worse with pressure   Review of Systems: Negative except as noted in the HPI. Denies N/V/F/Ch.  Past Medical History:  Diagnosis Date   Adenomatous colon polyp 02/20/2014   Asthma    Benign neoplasm of colon 02/20/2014   Chest pain 09/13/2013   Coronary angioplasty status 11/13/2018   Coronary artery disease    Depression    Diabetes mellitus without complication (HCC)    GERD (gastroesophageal reflux disease)    History of hiatal hernia    Hypertension    Hypokalemia 05/29/2019   Hypomagnesemia 07/02/2022   Nausea with vomiting, unspecified 06/07/2019   NSTEMI (non-ST elevated myocardial infarction) (HCC) 09/23/2018   Pain    BACK   Stroke (HCC)    x3   Thyroid cyst    TIA (transient ischemic attack) 08/06/2017   Unstable angina (HCC) 10/20/2017   Weight loss 06/07/2019    Current Outpatient Medications:    Accu-Chek Softclix Lancets lancets, USE TO TEST SUGARS TWICE DAILY, Disp: 200 each, Rfl: 3   acetaminophen (TYLENOL) 500 MG tablet, Take 1,000 mg by mouth every 6 (six) hours as needed for mild pain or headache., Disp: , Rfl:    aspirin EC 81 MG tablet, Take 81 mg by mouth daily., Disp: , Rfl:    atorvastatin (LIPITOR) 80 MG tablet, TAKE 1 TABLET EVERY DAY, Disp: 90 tablet, Rfl: 3   Blood Glucose Monitoring Suppl (ACCU-CHEK GUIDE ME) w/Device KIT, , Disp: , Rfl:    chlorthalidone (HYGROTON) 25 MG tablet,  TAKE 1 TABLET EVERY DAY, Disp: 90 tablet, Rfl: 3   clopidogrel (PLAVIX) 75 MG tablet, TAKE 1 TABLET EVERY DAY, Disp: 90 tablet, Rfl: 3   fluticasone (FLONASE) 50 MCG/ACT nasal spray, USE 2 SPRAYS IN EACH NOSTRIL EVERY DAY, Disp: 48 g, Rfl: 3   glucose blood (ACCU-CHEK GUIDE) test strip, Blood Glucose Test In Vitro Strip QTY: 100 strip Days: 90 Refills: 3  Written: 06/07/19 Patient Instructions: E11.9 - to check blodd sugars once daily, Disp: , Rfl:    isosorbide mononitrate (IMDUR) 30 MG 24 hr tablet, TAKE 1 TABLET EVERY DAY, Disp: 90 tablet, Rfl: 3   losartan (COZAAR) 25 MG tablet, TAKE 1 TABLET (25 MG TOTAL) BY MOUTH DAILY., Disp: 90 tablet, Rfl: 2   methocarbamol (ROBAXIN) 500 MG tablet, Take 500 mg by mouth at bedtime as needed for muscle spasms., Disp: , Rfl:    metoprolol succinate (TOPROL XL) 25 MG 24 hr tablet, Take 1 tablet (25 mg total) by mouth daily., Disp: 30 tablet, Rfl: 11   nitroGLYCERIN (NITROSTAT) 0.4 MG SL tablet, Place 1 tablet (0.4 mg total) under the tongue every 5 (five) minutes x 3 doses as needed for chest pain. (Patient not taking: Reported on 11/13/2023), Disp: 25 tablet, Rfl: 0   nitroGLYCERIN (NITROSTAT) 0.6 MG SL tablet, USE 1 TABLET UNDER TONGUE AS NEEDED FOR CHEST PAIN. REPEAT  IN 5 MIN IF NO RELIEF. IF MORE THAN 2 TABS NEEDED, CALL 911, Disp: 100 tablet, Rfl: 3   ondansetron (ZOFRAN-ODT) 4 MG disintegrating tablet, DISSOLVE 1 TABLET IN MOUTH UP TO EVERY 8 HOURS AS NEEDED FOR NAUSEA, Disp: 90 tablet, Rfl: 3   pantoprazole (PROTONIX) 40 MG tablet, TAKE 1 TABLET BY MOUTH EVERY MORNING 30 MINS BEFORE FOOD, Disp: 90 tablet, Rfl: 3   potassium chloride SA (KLOR-CON M) 20 MEQ tablet, TAKE 1 TABLET EVERY DAY, Disp: 90 tablet, Rfl: 3   ranolazine (RANEXA) 500 MG 12 hr tablet, TAKE 1 TABLET TWICE DAILY, Disp: 180 tablet, Rfl: 3   RYBELSUS 3 MG TABS, TAKE 1 TABLET EVERY DAY, Disp: 90 tablet, Rfl: 3   sucralfate (CARAFATE) 1 g tablet, TAKE 1 TABLET BY MOUTH 4 TIMES DAILY., Disp: 360  tablet, Rfl: 1   Zinc Oxide 16 % OINT, Apply 1 Application topically 4 (four) times daily as needed., Disp: 454 g, Rfl: 3  Social History   Tobacco Use  Smoking Status Former   Current packs/day: 0.00   Types: Cigarettes   Quit date: 09/13/1998   Years since quitting: 25.4  Smokeless Tobacco Never    Allergies  Allergen Reactions   Lisinopril Swelling and Shortness Of Breath   Meloxicam Nausea Only and Nausea And Vomiting   Tramadol Nausea Only and Other (See Comments)    sleepiness  Other reaction(s): Dizziness, Other, Other (See Comments)  somnolence  Other reaction(s): Other (See Comments), Other (See Comments)  Would not wake up after taking this medication   Amoxicillin Rash    Don't remember  Don't remember  Don't remember    Amoxicillin-Pot Clavulanate Hives and Rash   Clavulanic Acid Rash   Objective:  There were no vitals filed for this visit. There is no height or weight on file to calculate BMI. Constitutional Well developed. Well nourished.  Vascular Dorsalis pedis pulses palpable bilaterally. Posterior tibial pulses palpable bilaterally. Capillary refill normal to all digits.  No cyanosis or clubbing noted. Pedal hair growth normal.  Neurologic Normal speech. Oriented to person, place, and time. Epicritic sensation to light touch grossly present bilaterally.  Dermatologic Nails well groomed and normal in appearance. No open wounds. No skin lesions.  Orthopedic: Normal joint ROM without pain or crepitus bilaterally. No visible deformities. Tender to palpation at the calcaneal tuber bilaterally. No pain with calcaneal squeeze bilaterally. Ankle ROM diminished range of motion bilaterally. Silfverskiold Test: positive bilaterally.   Radiographs: None  Assessment:   1. Plantar fasciitis, right   2. Plantar fasciitis, left   3. Pes planovalgus    Plan:  Patient was evaluated and treated and all questions answered.  Plantar Fasciitis,  bilaterally - XR reviewed as above.  - Educated on icing and stretching. Instructions given.  - Injection delivered to the plantar fascia as below. - DME: Plantar fascial brace dispensed to support the medial longitudinal arch of the foot and offload pressure from the heel and prevent arch collapse during weightbearing - Pharmacologic management: None  Pes planovalgus -I explained to patient the etiology of pes planovalgus and relationship with Planter fasciitis and various treatment options were discussed.  Given patient foot structure in the setting of Planter fasciitis I believe patient will benefit from custom-made orthotics to help control the hindfoot motion support the arch of the foot and take the stress away from plantar fascial.  Patient agrees with the plan like to proceed with orthotics -Patient would benefit from power step insoles power  step was discussed   Procedure: Injection Tendon/Ligament Location: Bilateral plantar fascia at the glabrous junction; medial approach. Skin Prep: alcohol Injectate: 0.5 cc 0.5% marcaine plain, 0.5 cc of 1% Lidocaine, 0.5 cc kenalog 10. Disposition: Patient tolerated procedure well. Injection site dressed with a band-aid.  No follow-ups on file.

## 2024-02-21 ENCOUNTER — Other Ambulatory Visit: Payer: Self-pay | Admitting: Cardiovascular Disease

## 2024-02-22 DIAGNOSIS — M791 Myalgia, unspecified site: Secondary | ICD-10-CM | POA: Diagnosis not present

## 2024-02-22 DIAGNOSIS — M1611 Unilateral primary osteoarthritis, right hip: Secondary | ICD-10-CM | POA: Diagnosis not present

## 2024-02-22 DIAGNOSIS — M533 Sacrococcygeal disorders, not elsewhere classified: Secondary | ICD-10-CM | POA: Diagnosis not present

## 2024-02-22 DIAGNOSIS — M51362 Other intervertebral disc degeneration, lumbar region with discogenic back pain and lower extremity pain: Secondary | ICD-10-CM | POA: Diagnosis not present

## 2024-02-26 ENCOUNTER — Ambulatory Visit (INDEPENDENT_AMBULATORY_CARE_PROVIDER_SITE_OTHER): Admitting: Cardiology

## 2024-02-26 ENCOUNTER — Encounter: Payer: Self-pay | Admitting: Cardiology

## 2024-02-26 VITALS — BP 108/78 | HR 67 | Ht 63.0 in | Wt 128.0 lb

## 2024-02-26 DIAGNOSIS — R079 Chest pain, unspecified: Secondary | ICD-10-CM

## 2024-02-26 DIAGNOSIS — I25118 Atherosclerotic heart disease of native coronary artery with other forms of angina pectoris: Secondary | ICD-10-CM | POA: Diagnosis not present

## 2024-02-26 DIAGNOSIS — I10 Essential (primary) hypertension: Secondary | ICD-10-CM | POA: Diagnosis not present

## 2024-02-26 NOTE — Assessment & Plan Note (Signed)
 Stress test March 2024 normal. Patient having chest pain today. LHC 06/2022 Patient has ostial high-grade lesion in the LAD but LIMA to the LAD is patent. Patient also has occluded graft to PDA but stent in the mid to distal RCA has no significant restenosis. Left circumflex has no significant disease.  Will order a stress test and echocardiogram.

## 2024-02-26 NOTE — Progress Notes (Signed)
 Cardiology Office Note   Date:  02/26/2024   ID:  SANJNA HASKEW, DOB 1961-03-09, MRN [id number]  PCP:  Miki Kins, FNP  Cardiologist:  Marisue Ivan, NP      History of Present Illness: Kayla Malone is a 63 y.o. female who presents for  Chief Complaint  Patient presents with   Acute Visit    Chest Pain & Tightness     Patient in office for an acute visit, complaining of chest pain, tightness in shoulders and neck. States she did not take any sublingual NTG. Started at 9 am this morning. Some nausea, took Zofran this am, with relief. EKG in office today showed no acute changes.       Past Medical History:  Diagnosis Date   Adenomatous colon polyp 02/20/2014   Asthma    Benign neoplasm of colon 02/20/2014   Chest pain 09/13/2013   Coronary angioplasty status 11/13/2018   Coronary artery disease    Depression    Diabetes mellitus without complication (HCC)    GERD (gastroesophageal reflux disease)    History of hiatal hernia    Hypertension    Hypokalemia 05/29/2019   Hypomagnesemia 07/02/2022   Nausea with vomiting, unspecified 06/07/2019   NSTEMI (non-ST elevated myocardial infarction) (HCC) 09/23/2018   Pain    BACK   Stroke (HCC)    x3   Thyroid cyst    TIA (transient ischemic attack) 08/06/2017   Unstable angina (HCC) 10/20/2017   Weight loss 06/07/2019     Past Surgical History:  Procedure Laterality Date   ABDOMINAL HYSTERECTOMY     CARDIAC SURGERY     CATARACT EXTRACTION W/PHACO Left 08/15/2017   Procedure: CATARACT EXTRACTION PHACO AND INTRAOCULAR LENS PLACEMENT (IOC);  Surgeon: Galen Manila, MD;  Location: [facility] ORS;  Service: Ophthalmology;  Laterality: Left;  [place] 00:40.6 AP% 7.3 CDE 2.98 FLUID PACK LOT # [id number]   CORONARY ARTERY BYPASS GRAFT N/A 10/23/2017   Procedure: CORONARY ARTERY BYPASS GRAFTING (CABG) x two  , using left internal mammary artery and right leg greater saphenous vein harvested  endoscopically -  LIMA to LAD, SVG to RCA;  Surgeon: Delight Ovens, MD;  Location: [facility] OR;  Service: Open Heart Surgery;  Laterality: N/A;   CORONARY STENT INTERVENTION N/A 09/24/2018   Procedure: CORONARY STENT INTERVENTION;  Surgeon: Corky Crafts, MD;  Location: [facility] INVASIVE CV LAB;  Service: Cardiovascular;  Laterality: N/A;   ESOPHAGOGASTRODUODENOSCOPY N/A 05/30/2019   Procedure: ESOPHAGOGASTRODUODENOSCOPY (EGD);  Surgeon: Toledo, Boykin Nearing, MD;  Location: [facility] ENDOSCOPY;  Service: Gastroenterology;  Laterality: N/A;   EYE SURGERY     LEFT HEART CATH AND CORONARY ANGIOGRAPHY Left 10/09/2017   Procedure: LEFT HEART CATH AND CORONARY ANGIOGRAPHY;  Surgeon: Laurier Nancy, MD;  Location: [facility] INVASIVE CV LAB;  Service: Cardiovascular;  Laterality: Left;   LEFT HEART CATH AND CORS/GRAFTS ANGIOGRAPHY N/A 09/24/2018   Procedure: LEFT HEART CATH AND CORS/GRAFTS ANGIOGRAPHY;  Surgeon: Corky Crafts, MD;  Location: [facility] INVASIVE CV LAB;  Service: Cardiovascular;  Laterality: N/A;   LEFT HEART CATH AND CORS/GRAFTS ANGIOGRAPHY N/A 07/04/2022   Procedure: LEFT HEART CATH AND CORS/GRAFTS ANGIOGRAPHY;  Surgeon: Laurier Nancy, MD;  Location: [facility] INVASIVE CV LAB;  Service: Cardiovascular;  Laterality: N/A;   TEE WITHOUT CARDIOVERSION N/A 10/23/2017   Procedure: TRANSESOPHAGEAL ECHOCARDIOGRAM (TEE);  Surgeon: Delight Ovens, MD;  Location: [facility] OR;  Service: Open Heart Surgery;  Laterality: N/A;     Current Outpatient Medications  Medication Sig Dispense Refill   Accu-Chek Softclix Lancets lancets USE TO TEST SUGARS TWICE DAILY 200 each 3   acetaminophen (TYLENOL) 500 MG tablet Take 1,000 mg by mouth every 6 (six) hours as needed for mild pain or headache.     aspirin EC 81 MG tablet Take 81 mg by mouth daily.     atorvastatin (LIPITOR) 80 MG tablet TAKE 1 TABLET EVERY DAY 90 tablet 3   Blood Glucose Monitoring Suppl (ACCU-CHEK GUIDE ME) w/Device KIT      chlorthalidone (HYGROTON) 25 MG tablet TAKE 1  TABLET EVERY DAY 90 tablet 3   clopidogrel (PLAVIX) 75 MG tablet TAKE 1 TABLET EVERY DAY 90 tablet 3   fluticasone (FLONASE) 50 MCG/ACT nasal spray USE 2 SPRAYS IN EACH NOSTRIL EVERY DAY 48 g 3   glucose blood (ACCU-CHEK GUIDE) test strip Blood Glucose Test In Vitro Strip QTY: 100 strip Days: 90 Refills: 3  Written: 06/07/19 Patient Instructions: E11.9 - to check blodd sugars once daily     isosorbide mononitrate (IMDUR) 30 MG 24 hr tablet TAKE 1 TABLET EVERY DAY 90 tablet 3   losartan (COZAAR) 25 MG tablet TAKE 1 TABLET EVERY DAY 90 tablet 3   methocarbamol (ROBAXIN) 500 MG tablet Take 500 mg by mouth at bedtime as needed for muscle spasms.     metoprolol succinate (TOPROL XL) 25 MG 24 hr tablet Take 1 tablet (25 mg total) by mouth daily. 30 tablet 11   nitroGLYCERIN (NITROSTAT) 0.4 MG SL tablet Place 1 tablet (0.4 mg total) under the tongue every 5 (five) minutes x 3 doses as needed for chest pain. (Patient not taking: Reported on 11/13/2023) 25 tablet 0   nitroGLYCERIN (NITROSTAT) 0.6 MG SL tablet USE 1 TABLET UNDER TONGUE AS NEEDED FOR CHEST PAIN. REPEAT IN 5 MIN IF NO RELIEF. IF MORE THAN 2 TABS NEEDED, CALL 911 100 tablet 3   ondansetron (ZOFRAN-ODT) 4 MG disintegrating tablet DISSOLVE 1 TABLET IN MOUTH UP TO EVERY 8 HOURS AS NEEDED FOR NAUSEA 90 tablet 3   pantoprazole (PROTONIX) 40 MG tablet TAKE 1 TABLET BY MOUTH EVERY MORNING 30 MINS BEFORE FOOD 90 tablet 3   potassium chloride SA (KLOR-CON M) 20 MEQ tablet TAKE 1 TABLET EVERY DAY 90 tablet 3   ranolazine (RANEXA) 500 MG 12 hr tablet TAKE 1 TABLET TWICE DAILY 180 tablet 3   RYBELSUS 3 MG TABS TAKE 1 TABLET EVERY DAY 90 tablet 3   sucralfate (CARAFATE) 1 g tablet TAKE 1 TABLET BY MOUTH 4 TIMES DAILY. 360 tablet 1   Zinc Oxide 16 % OINT Apply 1 Application topically 4 (four) times daily as needed. 454 g 3   No current facility-administered medications for this visit.    Allergies:   Lisinopril, Meloxicam, Tramadol, Amoxicillin,  Amoxicillin-pot clavulanate, and Clavulanic acid    Social History:   reports that she quit smoking about 25 years ago. Her smoking use included cigarettes. She has never used smokeless tobacco. She reports that she does not drink alcohol and does not use drugs.   Family History:  family history includes COPD in her brother; Cirrhosis in her father; Coronary artery disease in her brother, father, and sister; Diabetes type II in her father; Heart failure in her mother; Hypertension in her father; Kidney disease in her father; Pulmonary embolism in her mother; Stroke in her sister.    ROS:     Review of Systems  Constitutional: Negative.   HENT: Negative.    Eyes: Negative.  Respiratory: Negative.  Negative for shortness of breath.   Cardiovascular:  Positive for chest pain.  Gastrointestinal:  Positive for nausea. Negative for vomiting.  Genitourinary: Negative.   Musculoskeletal: Negative.   Skin: Negative.   Neurological: Negative.   Endo/Heme/Allergies: Negative.   Psychiatric/Behavioral: Negative.    All other systems reviewed and are negative.     All other systems are reviewed and negative.    PHYSICAL EXAM: VS:  BP 108/78 (BP Location: Right Arm, Patient Position: Sitting, Cuff Size: Small)   Pulse 67   Ht 5\' 3"  (1.6 m)   Wt 128 lb (58.1 kg)   SpO2 99%   BMI 22.67 kg/m  , BMI Body mass index is 22.67 kg/m. Last weight:  Wt Readings from Last 3 Encounters:  02/26/24 128 lb (58.1 kg)  11/30/23 129 lb (58.5 kg)  11/13/23 127 lb (57.6 kg)     Physical Exam Vitals and nursing note reviewed.  Constitutional:      Appearance: Normal appearance. She is normal weight.  HENT:     Head: Normocephalic and atraumatic.     Nose: Nose normal.     Mouth/Throat:     Mouth: Mucous membranes are moist.     Pharynx: Oropharynx is clear.  Eyes:     Conjunctiva/sclera: Conjunctivae normal.     Pupils: Pupils are equal, round, and reactive to light.  Cardiovascular:      Rate and Rhythm: Normal rate and regular rhythm.     Pulses: Normal pulses.     Heart sounds: Normal heart sounds.  Pulmonary:     Effort: Pulmonary effort is normal.     Breath sounds: Normal breath sounds.  Abdominal:     General: Abdomen is flat. Bowel sounds are normal.     Palpations: Abdomen is soft.  Musculoskeletal:        General: Normal range of motion.     Cervical back: Normal range of motion.  Skin:    General: Skin is warm and dry.  Neurological:     General: No focal deficit present.     Mental Status: She is alert and oriented to person, place, and time. Mental status is at baseline.  Psychiatric:        Mood and Affect: Mood normal.        Behavior: Behavior normal.      EKG: sinus bradycardia, HR 59  Recent Labs: 11/14/2023: ALT 10; BUN 9; Creatinine, Ser 1.08; Hemoglobin 13.4; Platelets 351; Potassium 3.3; Sodium 141; TSH 0.738    Lipid Panel    Component Value Date/Time   CHOL 160 11/14/2023 0833   TRIG 57 11/14/2023 0833   HDL 58 11/14/2023 0833   CHOLHDL 2.8 11/14/2023 0833   CHOLHDL 4.0 07/04/2022 0623   VLDL 14 07/04/2022 0623   LDLCALC 90 11/14/2023 0833     ASSESSMENT AND PLAN:    ICD-10-CM   1. Chest pain, unspecified type  R07.9 PCV ECHOCARDIOGRAM COMPLETE    MYOCARDIAL PERFUSION IMAGING    2. Coronary artery disease of native artery of native heart with stable angina pectoris (HCC)  I25.118 PCV ECHOCARDIOGRAM COMPLETE    MYOCARDIAL PERFUSION IMAGING    3. Benign essential hypertension  I10 Basic metabolic panel    PCV ECHOCARDIOGRAM COMPLETE    MYOCARDIAL PERFUSION IMAGING       Problem List Items Addressed This Visit       Cardiovascular and Mediastinum   Benign essential hypertension   Controlled on recheck. Continue same  medications. Will check potassium level today.      Relevant Orders   Basic metabolic panel   PCV ECHOCARDIOGRAM COMPLETE   MYOCARDIAL PERFUSION IMAGING   Coronary artery disease of native artery of  native heart with stable angina pectoris [facility])   Stress test March 2024 normal. Patient having chest pain today. LHC 06/2022 Patient has ostial high-grade lesion in the LAD but LIMA to the LAD is patent. Patient also has occluded graft to PDA but stent in the mid to distal RCA has no significant restenosis. Left circumflex has no significant disease.  Will order a stress test and echocardiogram.       Relevant Orders   PCV ECHOCARDIOGRAM COMPLETE   MYOCARDIAL PERFUSION IMAGING     Other   Chest pain - Primary   Relevant Orders   PCV ECHOCARDIOGRAM COMPLETE   MYOCARDIAL PERFUSION IMAGING     Disposition:   Return in about 1 week (around 03/04/2024) for with SK.    Total time spent: 25 minutes  Signed,  Marisue Ivan, NP  02/26/2024 2:33 PM    [facility]

## 2024-02-26 NOTE — Assessment & Plan Note (Addendum)
 Controlled on recheck. Continue same medications. Will check potassium level today.

## 2024-02-27 ENCOUNTER — Encounter: Payer: Self-pay | Admitting: Cardiovascular Disease

## 2024-02-27 LAB — BASIC METABOLIC PANEL
BUN/Creatinine Ratio: 15 (ref 12–28)
BUN: 17 mg/dL (ref 8–27)
CO2: 25 mmol/L (ref 20–29)
Calcium: 9.9 mg/dL (ref 8.7–10.3)
Chloride: 98 mmol/L (ref 96–106)
Creatinine, Ser: 1.16 mg/dL — ABNORMAL HIGH (ref 0.57–1.00)
Glucose: 134 mg/dL — ABNORMAL HIGH (ref 70–99)
Potassium: 4.1 mmol/L (ref 3.5–5.2)
Sodium: 137 mmol/L (ref 134–144)
eGFR: 53 mL/min/{1.73_m2} — ABNORMAL LOW (ref >59)

## 2024-02-28 ENCOUNTER — Ambulatory Visit (INDEPENDENT_AMBULATORY_CARE_PROVIDER_SITE_OTHER): Payer: Medicare HMO | Admitting: Family

## 2024-02-28 ENCOUNTER — Encounter: Payer: Self-pay | Admitting: Family

## 2024-02-28 VITALS — BP 140/94 | HR 66 | Ht 63.0 in | Wt 120.0 lb

## 2024-02-28 DIAGNOSIS — E782 Mixed hyperlipidemia: Secondary | ICD-10-CM | POA: Diagnosis not present

## 2024-02-28 DIAGNOSIS — T82857D Stenosis of cardiac prosthetic devices, implants and grafts, subsequent encounter: Secondary | ICD-10-CM | POA: Diagnosis not present

## 2024-02-28 DIAGNOSIS — E1165 Type 2 diabetes mellitus with hyperglycemia: Secondary | ICD-10-CM | POA: Diagnosis not present

## 2024-02-28 DIAGNOSIS — E860 Dehydration: Secondary | ICD-10-CM

## 2024-02-28 DIAGNOSIS — E538 Deficiency of other specified B group vitamins: Secondary | ICD-10-CM

## 2024-02-28 DIAGNOSIS — E041 Nontoxic single thyroid nodule: Secondary | ICD-10-CM

## 2024-02-28 DIAGNOSIS — R5383 Other fatigue: Secondary | ICD-10-CM

## 2024-02-28 DIAGNOSIS — I10 Essential (primary) hypertension: Secondary | ICD-10-CM

## 2024-02-28 DIAGNOSIS — E559 Vitamin D deficiency, unspecified: Secondary | ICD-10-CM

## 2024-02-28 LAB — POCT URINALYSIS DIPSTICK
Bilirubin, UA: NEGATIVE
Glucose, UA: NEGATIVE
Ketones, UA: NEGATIVE
Nitrite, UA: NEGATIVE
Protein, UA: NEGATIVE
Spec Grav, UA: 1.015 (ref 1.010–1.025)
Urobilinogen, UA: 0.2 U/dL
pH, UA: 6 (ref 5.0–8.0)

## 2024-02-28 LAB — POC CREATINE & ALBUMIN,URINE
Albumin/Creatinine Ratio, Urine, POC: <30
Creatinine, POC: 200 mg/dL
Microalbumin Ur, POC: 30 mg/L

## 2024-02-28 MED ORDER — CLONAZEPAM 0.5 MG PO TABS: 0.5000 mg | ORAL_TABLET | Freq: Two times a day (BID) | ORAL | 0 refills | Status: DC | PRN

## 2024-02-29 LAB — CBC WITH DIFFERENTIAL/PLATELET
Basophils Absolute: 0 10*3/uL (ref 0.0–0.2)
Basos: 0 %
EOS (ABSOLUTE): 0.1 10*3/uL (ref 0.0–0.4)
Eos: 1 %
Hematocrit: 48.2 % — ABNORMAL HIGH (ref 34.0–46.6)
Hemoglobin: 15.7 g/dL (ref 11.1–15.9)
Immature Grans (Abs): 0 10*3/uL (ref 0.0–0.1)
Immature Granulocytes: 0 %
Lymphocytes Absolute: 3.5 10*3/uL — ABNORMAL HIGH (ref 0.7–3.1)
Lymphs: 42 %
MCH: 28.6 pg (ref 26.6–33.0)
MCHC: 32.6 g/dL (ref 31.5–35.7)
MCV: 88 fL (ref 79–97)
Monocytes Absolute: 0.5 10*3/uL (ref 0.1–0.9)
Monocytes: 6 %
Neutrophils Absolute: 4.3 10*3/uL (ref 1.4–7.0)
Neutrophils: 51 %
Platelets: 351 10*3/uL (ref 150–450)
RBC: 5.49 x10E6/uL — ABNORMAL HIGH (ref 3.77–5.28)
RDW: 12.9 % (ref 11.7–15.4)
WBC: 8.4 10*3/uL (ref 3.4–10.8)

## 2024-02-29 LAB — CMP14+EGFR
ALT: 13 IU/L (ref 0–32)
AST: 14 IU/L (ref 0–40)
Albumin: 4.8 g/dL (ref 3.9–4.9)
Alkaline Phosphatase: 103 IU/L (ref 44–121)
BUN/Creatinine Ratio: 12 (ref 12–28)
BUN: 16 mg/dL (ref 8–27)
Bilirubin Total: 0.9 mg/dL (ref 0.0–1.2)
CO2: 27 mmol/L (ref 20–29)
Calcium: 9.8 mg/dL (ref 8.7–10.3)
Chloride: 95 mmol/L — ABNORMAL LOW (ref 96–106)
Creatinine, Ser: 1.3 mg/dL — ABNORMAL HIGH (ref 0.57–1.00)
Globulin, Total: 3 g/dL (ref 1.5–4.5)
Glucose: 103 mg/dL — ABNORMAL HIGH (ref 70–99)
Potassium: 3.5 mmol/L (ref 3.5–5.2)
Sodium: 137 mmol/L (ref 134–144)
Total Protein: 7.8 g/dL (ref 6.0–8.5)
eGFR: 46 mL/min/{1.73_m2} — ABNORMAL LOW (ref >59)

## 2024-02-29 LAB — IRON,TIBC AND FERRITIN PANEL
Ferritin: 90 ng/mL (ref 15–150)
Iron Saturation: 34 % (ref 15–55)
Iron: 133 ug/dL (ref 27–139)
Total Iron Binding Capacity: 386 ug/dL (ref 250–450)
UIBC: 253 ug/dL (ref 118–369)

## 2024-02-29 LAB — LIPID PANEL
Chol/HDL Ratio: 3.6 ratio (ref 0.0–4.4)
Cholesterol, Total: 183 mg/dL (ref 100–199)
HDL: 51 mg/dL (ref >39)
LDL Chol Calc (NIH): 116 mg/dL — ABNORMAL HIGH (ref 0–99)
Triglycerides: 88 mg/dL (ref 0–149)
VLDL Cholesterol Cal: 16 mg/dL (ref 5–40)

## 2024-02-29 LAB — HEMOGLOBIN A1C
Est. average glucose Bld gHb Est-mCnc: 143 mg/dL
Hgb A1c MFr Bld: 6.6 % — ABNORMAL HIGH (ref 4.8–5.6)

## 2024-02-29 LAB — VITAMIN D 25 HYDROXY (VIT D DEFICIENCY, FRACTURES): Vit D, 25-Hydroxy: 41.5 ng/mL (ref 30.0–100.0)

## 2024-02-29 LAB — TSH: TSH: 1.03 u[IU]/mL (ref 0.450–4.500)

## 2024-02-29 LAB — VITAMIN B12: Vitamin B-12: >2000 pg/mL — ABNORMAL HIGH (ref 232–1245)

## 2024-03-01 ENCOUNTER — Other Ambulatory Visit: Payer: Self-pay | Admitting: Family

## 2024-03-01 MED ORDER — CIPROFLOXACIN HCL 500 MG PO TABS
500.0000 mg | ORAL_TABLET | Freq: Every day | ORAL | 0 refills | Status: AC
Start: 2024-03-01 — End: 2024-03-08

## 2024-03-04 ENCOUNTER — Encounter

## 2024-03-07 ENCOUNTER — Ambulatory Visit

## 2024-03-07 ENCOUNTER — Ambulatory Visit: Admitting: Cardiovascular Disease

## 2024-03-07 DIAGNOSIS — I25118 Atherosclerotic heart disease of native coronary artery with other forms of angina pectoris: Secondary | ICD-10-CM

## 2024-03-07 DIAGNOSIS — I422 Other hypertrophic cardiomyopathy: Secondary | ICD-10-CM | POA: Diagnosis not present

## 2024-03-07 DIAGNOSIS — R079 Chest pain, unspecified: Secondary | ICD-10-CM

## 2024-03-07 DIAGNOSIS — I10 Essential (primary) hypertension: Secondary | ICD-10-CM

## 2024-03-08 ENCOUNTER — Ambulatory Visit: Admitting: Cardiovascular Disease

## 2024-03-11 ENCOUNTER — Ambulatory Visit (INDEPENDENT_AMBULATORY_CARE_PROVIDER_SITE_OTHER)

## 2024-03-11 DIAGNOSIS — R079 Chest pain, unspecified: Secondary | ICD-10-CM | POA: Diagnosis not present

## 2024-03-11 DIAGNOSIS — I10 Essential (primary) hypertension: Secondary | ICD-10-CM

## 2024-03-11 DIAGNOSIS — I25118 Atherosclerotic heart disease of native coronary artery with other forms of angina pectoris: Secondary | ICD-10-CM

## 2024-03-13 ENCOUNTER — Ambulatory Visit (INDEPENDENT_AMBULATORY_CARE_PROVIDER_SITE_OTHER): Admitting: Family

## 2024-03-13 ENCOUNTER — Encounter: Payer: Self-pay | Admitting: Family

## 2024-03-13 VITALS — BP 108/70 | HR 67 | Ht 63.0 in | Wt 125.0 lb

## 2024-03-13 DIAGNOSIS — I69359 Hemiplegia and hemiparesis following cerebral infarction affecting unspecified side: Secondary | ICD-10-CM

## 2024-03-13 DIAGNOSIS — I10 Essential (primary) hypertension: Secondary | ICD-10-CM

## 2024-03-13 DIAGNOSIS — R944 Abnormal results of kidney function studies: Secondary | ICD-10-CM | POA: Diagnosis not present

## 2024-03-13 DIAGNOSIS — E1165 Type 2 diabetes mellitus with hyperglycemia: Secondary | ICD-10-CM

## 2024-03-13 NOTE — Progress Notes (Signed)
 Established Patient Office Visit  Subjective:  Patient ID: Kayla Malone, female    DOB: 1961-06-19  Age: 63 y.o. MRN: [id number]  Chief Complaint  Patient presents with   Follow-up    2 week follow up    Patient is here today for her 2 week follow up.  She is feeling much better after her recent appointment, says that she feels back to normal.  We did labs at that time and her kidney function was low, so we adjusted and she says that her symptoms have improved significantly.    No other concerns at this time.   Past Medical History:  Diagnosis Date   Adenomatous colon polyp 02/20/2014   Asthma    Benign neoplasm of colon 02/20/2014   Chest pain 09/13/2013   Coronary angioplasty status 11/13/2018   Coronary artery disease    Depression    Diabetes mellitus without complication (HCC)    GERD (gastroesophageal reflux disease)    History of hiatal hernia    Hypertension    Hypokalemia 05/29/2019   Hypomagnesemia 07/02/2022   Nausea with vomiting, unspecified 06/07/2019   NSTEMI (non-ST elevated myocardial infarction) (HCC) 09/23/2018   Pain    BACK   Stroke (HCC)    x3   Thyroid cyst    TIA (transient ischemic attack) 08/06/2017   Unstable angina (HCC) 10/20/2017   Weight loss 06/07/2019    Past Surgical History:  Procedure Laterality Date   ABDOMINAL HYSTERECTOMY     CARDIAC SURGERY     CATARACT EXTRACTION W/PHACO Left 08/15/2017   Procedure: CATARACT EXTRACTION PHACO AND INTRAOCULAR LENS PLACEMENT (IOC);  Surgeon: Galen Manila, MD;  Location: [facility] ORS;  Service: Ophthalmology;  Laterality: Left;  [place] 00:40.6 AP% 7.3 CDE 2.98 FLUID PACK LOT # [id number] h   CORONARY ARTERY BYPASS GRAFT N/A 10/23/2017   Procedure: CORONARY ARTERY BYPASS GRAFTING (CABG) x two  , using left internal mammary artery and right leg greater saphenous vein harvested  endoscopically - LIMA to LAD, SVG to RCA;  Surgeon: Delight Ovens, MD;  Location: [facility] OR;  Service: Open Heart  Surgery;  Laterality: N/A;   CORONARY STENT INTERVENTION N/A 09/24/2018   Procedure: CORONARY STENT INTERVENTION;  Surgeon: Corky Crafts, MD;  Location: [facility] INVASIVE CV LAB;  Service: Cardiovascular;  Laterality: N/A;   ESOPHAGOGASTRODUODENOSCOPY N/A 05/30/2019   Procedure: ESOPHAGOGASTRODUODENOSCOPY (EGD);  Surgeon: Toledo, Boykin Nearing, MD;  Location: [facility] ENDOSCOPY;  Service: Gastroenterology;  Laterality: N/A;   EYE SURGERY     LEFT HEART CATH AND CORONARY ANGIOGRAPHY Left 10/09/2017   Procedure: LEFT HEART CATH AND CORONARY ANGIOGRAPHY;  Surgeon: Laurier Nancy, MD;  Location: [facility] INVASIVE CV LAB;  Service: Cardiovascular;  Laterality: Left;   LEFT HEART CATH AND CORS/GRAFTS ANGIOGRAPHY N/A 09/24/2018   Procedure: LEFT HEART CATH AND CORS/GRAFTS ANGIOGRAPHY;  Surgeon: Corky Crafts, MD;  Location: [facility] INVASIVE CV LAB;  Service: Cardiovascular;  Laterality: N/A;   LEFT HEART CATH AND CORS/GRAFTS ANGIOGRAPHY N/A 07/04/2022   Procedure: LEFT HEART CATH AND CORS/GRAFTS ANGIOGRAPHY;  Surgeon: Laurier Nancy, MD;  Location: [facility] INVASIVE CV LAB;  Service: Cardiovascular;  Laterality: N/A;   TEE WITHOUT CARDIOVERSION N/A 10/23/2017   Procedure: TRANSESOPHAGEAL ECHOCARDIOGRAM (TEE);  Surgeon: Delight Ovens, MD;  Location: [facility] OR;  Service: Open Heart Surgery;  Laterality: N/A;    Social History   Socioeconomic History   Marital status: Married    Spouse name: Not on file   Number of children: Not  on file   Years of education: Not on file   Highest education level: Not on file  Occupational History   Not on file  Tobacco Use   Smoking status: Former    Current packs/day: 0.00    Types: Cigarettes    Quit date: 09/13/1998    Years since quitting: 25.5   Smokeless tobacco: Never  Vaping Use   Vaping status: Never Used  Substance and Sexual Activity   Alcohol use: No   Drug use: No   Sexual activity: Yes    Birth control/protection: Post-menopausal  Other Topics Concern    Not on file  Social History Narrative   Not on file   Social Drivers of Health   Financial Resource Strain: Low Risk  (12/10/2023)   Overall Financial Resource Strain (CARDIA)    Difficulty of Paying Living Expenses: Not hard at all  Food Insecurity: No Food Insecurity (11/30/2023)   Hunger Vital Sign    Worried About Running Out of Food in the Last Year: Never true    Ran Out of Food in the Last Year: Never true  Transportation Needs: No Transportation Needs (12/10/2023)   PRAPARE - Administrator, Civil Service (Medical): No    Lack of Transportation (Non-Medical): No  Physical Activity: Insufficiently Active (11/30/2023)   Exercise Vital Sign    Days of Exercise per Week: 5 days    Minutes of Exercise per Session: 10 min  Stress: No Stress Concern Present (11/30/2023)   [employer] of Occupational Health - Occupational Stress Questionnaire    Feeling of Stress : Not at all  Social Connections: Moderately Isolated (12/10/2023)   Social Connection and Isolation Panel [NHANES]    Frequency of Communication with Friends and Family: More than three times a week    Frequency of Social Gatherings with Friends and Family: More than three times a week    Attends Religious Services: More than 4 times per year    Active Member of [employer] or Organizations: No    Attends Banker Meetings: Never    Marital Status: Widowed  Intimate Partner Violence: Not At Risk (11/30/2023)   Humiliation, Afraid, Rape, and Kick questionnaire    Fear of Current or Ex-Partner: No    Emotionally Abused: No    Physically Abused: No    Sexually Abused: No    Family History  Problem Relation Age of Onset   Heart failure Mother    Pulmonary embolism Mother    Cirrhosis Father    Coronary artery disease Father    Diabetes type II Father    Hypertension Father    Kidney disease Father    Coronary artery disease Sister    Stroke Sister    COPD Brother    Coronary artery  disease Brother    Breast cancer Neg Hx     Allergies  Allergen Reactions   Lisinopril Swelling and Shortness Of Breath   Meloxicam Nausea Only and Nausea And Vomiting   Tramadol Nausea Only and Other (See Comments)    sleepiness  Other reaction(s): Dizziness, Other, Other (See Comments)  somnolence  Other reaction(s): Other (See Comments), Other (See Comments)  Would not wake up after taking this medication   Amoxicillin Rash    Don't remember  Don't remember  Don't remember    Amoxicillin-Pot Clavulanate Hives and Rash   Clavulanic Acid Rash    Review of Systems  All other systems reviewed and are negative.  Objective:   BP 108/70   Pulse 67   Ht 5\' 3"  (1.6 m)   Wt 125 lb (56.7 kg)   SpO2 98%   BMI 22.14 kg/m   Vitals:   03/13/24 1019  BP: 108/70  Pulse: 67  Height: 5\' 3"  (1.6 m)  Weight: 125 lb (56.7 kg)  SpO2: 98%  BMI (Calculated): 22.15    Physical Exam Vitals and nursing note reviewed.  Constitutional:      Appearance: Normal appearance. She is normal weight.  HENT:     Head: Normocephalic.  Eyes:     Extraocular Movements: Extraocular movements intact.     Conjunctiva/sclera: Conjunctivae normal.     Pupils: Pupils are equal, round, and reactive to light.  Cardiovascular:     Rate and Rhythm: Normal rate.  Pulmonary:     Effort: Pulmonary effort is normal.  Neurological:     General: No focal deficit present.     Mental Status: She is alert and oriented to person, place, and time. Mental status is at baseline.  Psychiatric:        Mood and Affect: Mood normal.        Behavior: Behavior normal.        Thought Content: Thought content normal.        Judgment: Judgment normal.      Results for orders placed or performed in visit on 03/13/24  CMP14+EGFR  Result Value Ref Range   Glucose 90 70 - 99 mg/dL   BUN 9 8 - 27 mg/dL   Creatinine, Ser 1.61 0.57 - 1.00 mg/dL   eGFR 64 >09 UE/AVW/0.98   BUN/Creatinine Ratio 9 (L) 12 -  28   Sodium 139 134 - 144 mmol/L   Potassium 3.5 3.5 - 5.2 mmol/L   Chloride 97 96 - 106 mmol/L   CO2 27 20 - 29 mmol/L   Calcium 9.3 8.7 - 10.3 mg/dL   Total Protein 6.5 6.0 - 8.5 g/dL   Albumin 4.2 3.9 - 4.9 g/dL   Globulin, Total 2.3 1.5 - 4.5 g/dL   Bilirubin Total 0.4 0.0 - 1.2 mg/dL   Alkaline Phosphatase 82 44 - 121 IU/L   AST 15 0 - 40 IU/L   ALT 13 0 - 32 IU/L    Recent Results (from the past 2160 hours)  Basic metabolic panel     Status: Abnormal   Collection Time: 02/26/24  2:29 PM  Result Value Ref Range   Glucose 134 (H) 70 - 99 mg/dL   BUN 17 8 - 27 mg/dL   Creatinine, Ser 1.19 (H) 0.57 - 1.00 mg/dL   eGFR 53 (L) >14 NW/GNF/6.21   BUN/Creatinine Ratio 15 12 - 28   Sodium 137 134 - 144 mmol/L   Potassium 4.1 3.5 - 5.2 mmol/L   Chloride 98 96 - 106 mmol/L   CO2 25 20 - 29 mmol/L   Calcium 9.9 8.7 - 10.3 mg/dL  POCT Urinalysis Dipstick (30865)     Status: Abnormal   Collection Time: 02/28/24 10:36 AM  Result Value Ref Range   Color, UA yellow    Clarity, UA clear    Glucose, UA Negative Negative   Bilirubin, UA Negative    Ketones, UA Negative    Spec Grav, UA 1.015 1.010 - 1.025   Blood, UA 2+    pH, UA 6.0 5.0 - 8.0   Protein, UA Negative Negative   Urobilinogen, UA 0.2 0.2 or 1.0 E.U./dL   Nitrite, UA  Negative    Leukocytes, UA Trace (A) Negative   Appearance     Odor    Lipid panel     Status: Abnormal   Collection Time: 02/28/24 10:36 AM  Result Value Ref Range   Cholesterol, Total 183 100 - 199 mg/dL   Triglycerides 88 0 - 149 mg/dL   HDL 51 >16 mg/dL   VLDL Cholesterol Cal 16 5 - 40 mg/dL   LDL Chol Calc (NIH) 109 (H) 0 - 99 mg/dL   Chol/HDL Ratio 3.6 0.0 - 4.4 ratio    Comment:                                   T. Chol/HDL Ratio                                             Men  Women                               1/2 Avg.Risk  3.4    3.3                                   Avg.Risk  5.0    4.4                                2X Avg.Risk  9.6     7.1                                3X Avg.Risk 23.4   11.0   VITAMIN D 25 Hydroxy (Vit-D Deficiency, Fractures)     Status: None   Collection Time: 02/28/24 10:36 AM  Result Value Ref Range   Vit D, 25-Hydroxy 41.5 30.0 - 100.0 ng/mL    Comment: Vitamin D deficiency has been defined by the Institute of Medicine and an Endocrine Society practice guideline as a level of serum 25-OH vitamin D less than 20 ng/mL (1,2). The Endocrine Society went on to further define vitamin D insufficiency as a level between 21 and 29 ng/mL (2). 1. IOM (Institute of Medicine). 2010. Dietary reference    intakes for calcium and D. [place]: The    [employer]. 2. Holick MF, Binkley Benton, Bischoff-Ferrari HA, et al.    Evaluation, treatment, and prevention of vitamin D    deficiency: an Endocrine Society clinical practice    guideline. JCEM. 2011 Jul; 96(7):1911-30.   CMP14+EGFR     Status: Abnormal   Collection Time: 02/28/24 10:36 AM  Result Value Ref Range   Glucose 103 (H) 70 - 99 mg/dL   BUN 16 8 - 27 mg/dL   Creatinine, Ser 6.04 (H) 0.57 - 1.00 mg/dL   eGFR 46 (L) >54 UJ/WJX/9.14   BUN/Creatinine Ratio 12 12 - 28   Sodium 137 134 - 144 mmol/L   Potassium 3.5 3.5 - 5.2 mmol/L   Chloride 95 (L) 96 - 106 mmol/L   CO2 27 20 - 29 mmol/L   Calcium 9.8 8.7 - 10.3 mg/dL   Total Protein 7.8 6.0 -  8.5 g/dL   Albumin 4.8 3.9 - 4.9 g/dL   Globulin, Total 3.0 1.5 - 4.5 g/dL   Bilirubin Total 0.9 0.0 - 1.2 mg/dL   Alkaline Phosphatase 103 44 - 121 IU/L   AST 14 0 - 40 IU/L   ALT 13 0 - 32 IU/L  TSH     Status: None   Collection Time: 02/28/24 10:36 AM  Result Value Ref Range   TSH 1.030 0.450 - 4.500 uIU/mL  Hemoglobin A1c     Status: Abnormal   Collection Time: 02/28/24 10:36 AM  Result Value Ref Range   Hgb A1c MFr Bld 6.6 (H) 4.8 - 5.6 %    Comment:          Prediabetes: 5.7 - 6.4          Diabetes: >6.4          Glycemic control for adults with diabetes: <7.0    Est. average  glucose Bld gHb Est-mCnc 143 mg/dL  Vitamin J19     Status: Abnormal   Collection Time: 02/28/24 10:36 AM  Result Value Ref Range   Vitamin B-12 >2000 (H) 232 - 1245 pg/mL  CBC with Diff     Status: Abnormal   Collection Time: 02/28/24 10:36 AM  Result Value Ref Range   WBC 8.4 3.4 - 10.8 x10E3/uL   RBC 5.49 (H) 3.77 - 5.28 x10E6/uL   Hemoglobin 15.7 11.1 - 15.9 g/dL   Hematocrit 14.7 (H) 82.9 - 46.6 %   MCV 88 79 - 97 fL   MCH 28.6 26.6 - 33.0 pg   MCHC 32.6 31.5 - 35.7 g/dL   RDW 56.2 13.0 - 86.5 %   Platelets 351 150 - 450 x10E3/uL   Neutrophils 51 Not Estab. %   Lymphs 42 Not Estab. %   Monocytes 6 Not Estab. %   Eos 1 Not Estab. %   Basos 0 Not Estab. %   Neutrophils Absolute 4.3 1.4 - 7.0 x10E3/uL   Lymphocytes Absolute 3.5 (H) 0.7 - 3.1 x10E3/uL   Monocytes Absolute 0.5 0.1 - 0.9 x10E3/uL   EOS (ABSOLUTE) 0.1 0.0 - 0.4 x10E3/uL   Basophils Absolute 0.0 0.0 - 0.2 x10E3/uL   Immature Granulocytes 0 Not Estab. %   Immature Grans (Abs) 0.0 0.0 - 0.1 x10E3/uL  Iron, TIBC and Ferritin Panel     Status: None   Collection Time: 02/28/24 10:36 AM  Result Value Ref Range   Total Iron Binding Capacity 386 250 - 450 ug/dL   UIBC 784 696 - 295 ug/dL   Iron 284 27 - 132 ug/dL   Iron Saturation 34 15 - 55 %   Ferritin 90 15 - 150 ng/mL  POC CREATINE & ALBUMIN,URINE     Status: None   Collection Time: 02/28/24 10:38 AM  Result Value Ref Range   Microalbumin Ur, POC 30 mg/L   Creatinine, POC 200 mg/dL   Albumin/Creatinine Ratio, Urine, POC <30   CMP14+EGFR     Status: Abnormal   Collection Time: 03/13/24 10:45 AM  Result Value Ref Range   Glucose 90 70 - 99 mg/dL   BUN 9 8 - 27 mg/dL   Creatinine, Ser 4.40 0.57 - 1.00 mg/dL   eGFR 64 >10 UV/OZD/6.64   BUN/Creatinine Ratio 9 (L) 12 - 28   Sodium 139 134 - 144 mmol/L   Potassium 3.5 3.5 - 5.2 mmol/L   Chloride 97 96 - 106 mmol/L   CO2 27 20 - 29 mmol/L  Calcium 9.3 8.7 - 10.3 mg/dL   Total Protein 6.5 6.0 - 8.5 g/dL    Albumin 4.2 3.9 - 4.9 g/dL   Globulin, Total 2.3 1.5 - 4.5 g/dL   Bilirubin Total 0.4 0.0 - 1.2 mg/dL   Alkaline Phosphatase 82 44 - 121 IU/L   AST 15 0 - 40 IU/L   ALT 13 0 - 32 IU/L       Assessment & Plan:   Problem List Items Addressed This Visit       Nervous and Auditory   CVA, old, hemiparesis (HCC)   Patient stable.  Well controlled with current therapy.   Continue current meds.        Other Visit Diagnoses       Abnormal results of kidney function studies    -  Primary   Rechecking CMP today. Will call pt. with results.   Relevant Orders   CMP14+EGFR (Completed)       Return in about 3 months (around 06/12/2024).   Total time spent: 20 minutes  Miki Kins, FNP  03/13/2024   This document may have been prepared by [facility] Voice Recognition software and as such may include unintentional dictation errors.

## 2024-03-14 LAB — CMP14+EGFR
ALT: 13 IU/L (ref 0–32)
AST: 15 IU/L (ref 0–40)
Albumin: 4.2 g/dL (ref 3.9–4.9)
Alkaline Phosphatase: 82 IU/L (ref 44–121)
BUN/Creatinine Ratio: 9 — ABNORMAL LOW (ref 12–28)
BUN: 9 mg/dL (ref 8–27)
Bilirubin Total: 0.4 mg/dL (ref 0.0–1.2)
CO2: 27 mmol/L (ref 20–29)
Calcium: 9.3 mg/dL (ref 8.7–10.3)
Chloride: 97 mmol/L (ref 96–106)
Creatinine, Ser: 0.99 mg/dL (ref 0.57–1.00)
Globulin, Total: 2.3 g/dL (ref 1.5–4.5)
Glucose: 90 mg/dL (ref 70–99)
Potassium: 3.5 mmol/L (ref 3.5–5.2)
Sodium: 139 mmol/L (ref 134–144)
Total Protein: 6.5 g/dL (ref 6.0–8.5)
eGFR: 64 mL/min/{1.73_m2} (ref >59)

## 2024-03-15 ENCOUNTER — Ambulatory Visit (INDEPENDENT_AMBULATORY_CARE_PROVIDER_SITE_OTHER): Admitting: Cardiovascular Disease

## 2024-03-15 ENCOUNTER — Encounter: Payer: Self-pay | Admitting: Cardiovascular Disease

## 2024-03-15 VITALS — BP 128/72 | HR 75 | Ht 63.0 in | Wt 124.0 lb

## 2024-03-15 DIAGNOSIS — I10 Essential (primary) hypertension: Secondary | ICD-10-CM | POA: Diagnosis not present

## 2024-03-15 DIAGNOSIS — I25118 Atherosclerotic heart disease of native coronary artery with other forms of angina pectoris: Secondary | ICD-10-CM

## 2024-03-15 DIAGNOSIS — R079 Chest pain, unspecified: Secondary | ICD-10-CM

## 2024-03-15 DIAGNOSIS — E782 Mixed hyperlipidemia: Secondary | ICD-10-CM

## 2024-03-15 NOTE — Progress Notes (Signed)
 Cardiology Office Note   Date:  03/15/2024   ID:  [name], DOB [birth year], MRN [id number]  PCP:  Miki Kins, FNP  Cardiologist:  Adrian Blackwater, MD      History of Present Illness: Kayla Malone is a 63 y.o. female who presents for  Chief Complaint  Patient presents with   Follow-up    NST/ECHO results     No chest pain or SOB      Past Medical History:  Diagnosis Date   Adenomatous colon polyp 02/20/2014   Asthma    Benign neoplasm of colon 02/20/2014   Chest pain 09/13/2013   Coronary angioplasty status 11/13/2018   Coronary artery disease    Depression    Diabetes mellitus without complication (HCC)    GERD (gastroesophageal reflux disease)    History of hiatal hernia    Hypertension    Hypokalemia 05/29/2019   Hypomagnesemia 07/02/2022   Nausea with vomiting, unspecified 06/07/2019   NSTEMI (non-ST elevated myocardial infarction) (HCC) 09/23/2018   Pain    BACK   Stroke (HCC)    x3   Thyroid cyst    TIA (transient ischemic attack) 08/06/2017   Unstable angina (HCC) 10/20/2017   Weight loss 06/07/2019     Past Surgical History:  Procedure Laterality Date   ABDOMINAL HYSTERECTOMY     CARDIAC SURGERY     CATARACT EXTRACTION W/PHACO Left 08/15/2017   Procedure: CATARACT EXTRACTION PHACO AND INTRAOCULAR LENS PLACEMENT (IOC);  Surgeon: Galen Manila, MD;  Location: [facility] ORS;  Service: Ophthalmology;  Laterality: Left;  [place] 00:40.6 AP% 7.3 CDE 2.98 FLUID PACK LOT # [id number] h   CORONARY ARTERY BYPASS GRAFT N/A 10/23/2017   Procedure: CORONARY ARTERY BYPASS GRAFTING (CABG) x two  , using left internal mammary artery and right leg greater saphenous vein harvested  endoscopically - LIMA to LAD, SVG to RCA;  Surgeon: Delight Ovens, MD;  Location: [facility] OR;  Service: Open Heart Surgery;  Laterality: N/A;   CORONARY STENT INTERVENTION N/A 09/24/2018   Procedure: CORONARY STENT INTERVENTION;  Surgeon: Corky Crafts, MD;   Location: [facility] INVASIVE CV LAB;  Service: Cardiovascular;  Laterality: N/A;   ESOPHAGOGASTRODUODENOSCOPY N/A 05/30/2019   Procedure: ESOPHAGOGASTRODUODENOSCOPY (EGD);  Surgeon: Toledo, Boykin Nearing, MD;  Location: [facility] ENDOSCOPY;  Service: Gastroenterology;  Laterality: N/A;   EYE SURGERY     LEFT HEART CATH AND CORONARY ANGIOGRAPHY Left 10/09/2017   Procedure: LEFT HEART CATH AND CORONARY ANGIOGRAPHY;  Surgeon: Laurier Nancy, MD;  Location: [facility] INVASIVE CV LAB;  Service: Cardiovascular;  Laterality: Left;   LEFT HEART CATH AND CORS/GRAFTS ANGIOGRAPHY N/A 09/24/2018   Procedure: LEFT HEART CATH AND CORS/GRAFTS ANGIOGRAPHY;  Surgeon: Corky Crafts, MD;  Location: [facility] INVASIVE CV LAB;  Service: Cardiovascular;  Laterality: N/A;   LEFT HEART CATH AND CORS/GRAFTS ANGIOGRAPHY N/A 07/04/2022   Procedure: LEFT HEART CATH AND CORS/GRAFTS ANGIOGRAPHY;  Surgeon: Laurier Nancy, MD;  Location: [facility] INVASIVE CV LAB;  Service: Cardiovascular;  Laterality: N/A;   TEE WITHOUT CARDIOVERSION N/A 10/23/2017   Procedure: TRANSESOPHAGEAL ECHOCARDIOGRAM (TEE);  Surgeon: Delight Ovens, MD;  Location: [facility] OR;  Service: Open Heart Surgery;  Laterality: N/A;     Current Outpatient Medications  Medication Sig Dispense Refill   Accu-Chek Softclix Lancets lancets USE TO TEST SUGARS TWICE DAILY 200 each 3   acetaminophen (TYLENOL) 500 MG tablet Take 1,000 mg by mouth every 6 (six) hours as needed for mild pain or headache.  aspirin EC 81 MG tablet Take 81 mg by mouth daily.     atorvastatin (LIPITOR) 80 MG tablet TAKE 1 TABLET EVERY DAY 90 tablet 3   Blood Glucose Monitoring Suppl (ACCU-CHEK GUIDE ME) w/Device KIT      chlorthalidone (HYGROTON) 25 MG tablet TAKE 1 TABLET EVERY DAY 90 tablet 3   clonazePAM (KLONOPIN) 0.5 MG tablet Take 1 tablet (0.5 mg total) by mouth 2 (two) times daily as needed for anxiety. 60 tablet 0   clopidogrel (PLAVIX) 75 MG tablet TAKE 1 TABLET EVERY DAY 90 tablet 3   fluticasone  (FLONASE) 50 MCG/ACT nasal spray USE 2 SPRAYS IN EACH NOSTRIL EVERY DAY 48 g 3   glucose blood (ACCU-CHEK GUIDE) test strip Blood Glucose Test In Vitro Strip QTY: 100 strip Days: 90 Refills: 3  Written: 06/07/19 Patient Instructions: E11.9 - to check blodd sugars once daily     isosorbide mononitrate (IMDUR) 30 MG 24 hr tablet TAKE 1 TABLET EVERY DAY 90 tablet 3   losartan (COZAAR) 25 MG tablet TAKE 1 TABLET EVERY DAY 90 tablet 3   methocarbamol (ROBAXIN) 500 MG tablet Take 500 mg by mouth at bedtime as needed for muscle spasms.     metoprolol succinate (TOPROL XL) 25 MG 24 hr tablet Take 1 tablet (25 mg total) by mouth daily. 30 tablet 11   nitroGLYCERIN (NITROSTAT) 0.4 MG SL tablet Place 1 tablet (0.4 mg total) under the tongue every 5 (five) minutes x 3 doses as needed for chest pain. (Patient not taking: Reported on 03/13/2024) 25 tablet 0   nitroGLYCERIN (NITROSTAT) 0.6 MG SL tablet USE 1 TABLET UNDER TONGUE AS NEEDED FOR CHEST PAIN. REPEAT IN 5 MIN IF NO RELIEF. IF MORE THAN 2 TABS NEEDED, CALL 911 100 tablet 3   ondansetron (ZOFRAN-ODT) 4 MG disintegrating tablet DISSOLVE 1 TABLET IN MOUTH UP TO EVERY 8 HOURS AS NEEDED FOR NAUSEA 90 tablet 3   pantoprazole (PROTONIX) 40 MG tablet TAKE 1 TABLET BY MOUTH EVERY MORNING 30 MINS BEFORE FOOD 90 tablet 3   potassium chloride SA (KLOR-CON M) 20 MEQ tablet TAKE 1 TABLET EVERY DAY 90 tablet 3   ranolazine (RANEXA) 500 MG 12 hr tablet TAKE 1 TABLET TWICE DAILY 180 tablet 3   RYBELSUS 3 MG TABS TAKE 1 TABLET EVERY DAY 90 tablet 3   sucralfate (CARAFATE) 1 g tablet TAKE 1 TABLET BY MOUTH 4 TIMES DAILY. 360 tablet 1   Zinc Oxide 16 % OINT Apply 1 Application topically 4 (four) times daily as needed. 454 g 3   No current facility-administered medications for this visit.    Allergies:   Lisinopril, Meloxicam, Tramadol, Amoxicillin, Amoxicillin-pot clavulanate, and Clavulanic acid    Social History:   reports that she quit smoking about 25 years ago. Her  smoking use included cigarettes. She has never used smokeless tobacco. She reports that she does not drink alcohol and does not use drugs.   Family History:  family history includes COPD in her brother; Cirrhosis in her father; Coronary artery disease in her brother, father, and sister; Diabetes type II in her father; Heart failure in her mother; Hypertension in her father; Kidney disease in her father; Pulmonary embolism in her mother; Stroke in her sister.    ROS:     Review of Systems  Constitutional: Negative.   HENT: Negative.    Eyes: Negative.   Respiratory: Negative.    Gastrointestinal: Negative.   Genitourinary: Negative.   Musculoskeletal: Negative.   Skin:  Negative.   Neurological: Negative.   Endo/Heme/Allergies: Negative.   Psychiatric/Behavioral: Negative.    All other systems reviewed and are negative.     All other systems are reviewed and negative.    PHYSICAL EXAM: VS:  BP 128/72   Pulse 75   Ht 5\' 3"  (1.6 m)   Wt 124 lb (56.2 kg)   SpO2 99%   BMI 21.97 kg/m  , BMI Body mass index is 21.97 kg/m. Last weight:  Wt Readings from Last 3 Encounters:  03/15/24 124 lb (56.2 kg)  03/13/24 125 lb (56.7 kg)  02/28/24 120 lb (54.4 kg)     Physical Exam Constitutional:      Appearance: Normal appearance.  Cardiovascular:     Rate and Rhythm: Normal rate and regular rhythm.     Heart sounds: Normal heart sounds.  Pulmonary:     Effort: Pulmonary effort is normal.     Breath sounds: Normal breath sounds.  Musculoskeletal:     Right lower leg: No edema.     Left lower leg: No edema.  Neurological:     Mental Status: She is alert.       EKG:   Recent Labs: 02/28/2024: Hemoglobin 15.7; Platelets 351; TSH 1.030 03/13/2024: ALT 13; BUN 9; Creatinine, Ser 0.99; Potassium 3.5; Sodium 139    Lipid Panel    Component Value Date/Time   CHOL 183 02/28/2024 1036   TRIG 88 02/28/2024 1036   HDL 51 02/28/2024 1036   CHOLHDL 3.6 02/28/2024 1036   CHOLHDL  4.0 07/04/2022 0623   VLDL 14 07/04/2022 0623   LDLCALC 116 (H) 02/28/2024 1036      Other studies Reviewed: Additional studies/ records that were reviewed today include:  Review of the above records demonstrates:       No data to display            ASSESSMENT AND PLAN:    ICD-10-CM   1. Chest pain, unspecified type  R07.9    No furthe chest pain BU, creat normal, as may have had UTI. Stress test was normal with LVEF normal. ECHO unchanged.    2. Coronary artery disease of native artery of native heart with stable angina pectoris (HCC)  I25.118     3. Benign essential hypertension  I10     4. Mixed hyperlipidemia  E78.2        Problem List Items Addressed This Visit       Cardiovascular and Mediastinum   Benign essential hypertension   Coronary artery disease of native artery of native heart with stable angina pectoris (HCC)     Other   Chest pain - Primary   Mixed hyperlipidemia       Disposition:   Return in about 3 months (around 06/14/2024).    Total time spent: 30 minutes  Signed,  Adrian Blackwater, MD  03/15/2024 10:17 AM    [facility]

## 2024-03-17 ENCOUNTER — Encounter: Payer: Self-pay | Admitting: Family

## 2024-03-17 NOTE — Assessment & Plan Note (Signed)
 Patient stable.  Well controlled with current therapy.   Continue current meds.

## 2024-03-19 ENCOUNTER — Ambulatory Visit (INDEPENDENT_AMBULATORY_CARE_PROVIDER_SITE_OTHER): Admitting: Podiatry

## 2024-03-19 DIAGNOSIS — M722 Plantar fascial fibromatosis: Secondary | ICD-10-CM

## 2024-03-19 DIAGNOSIS — M62461 Contracture of muscle, right lower leg: Secondary | ICD-10-CM | POA: Diagnosis not present

## 2024-03-19 DIAGNOSIS — M62462 Contracture of muscle, left lower leg: Secondary | ICD-10-CM

## 2024-03-21 ENCOUNTER — Other Ambulatory Visit: Payer: Self-pay | Admitting: Family

## 2024-03-21 ENCOUNTER — Other Ambulatory Visit: Payer: Self-pay | Admitting: Cardiovascular Disease

## 2024-03-22 NOTE — Progress Notes (Signed)
 Patient notified

## 2024-03-26 NOTE — Progress Notes (Signed)
 Subjective:  Patient ID: Kayla Malone, female    DOB: October 24, 1961,  MRN: [id number]  Chief Complaint  Patient presents with   Plantar Fasciitis    Pt stated that she is doing much better she stated that her pain and everything is gone she stated that the braces and injection helped a lot     63 y.o. female presents with the above complaint.  Patient presents with bilateral heel pain that has gotten progressively worse worse with ambulation.  She states most of her pain is gone she still has some residual pain would like to discuss 1 steroid injection denies any other acute complaints   Review of Systems: Negative except as noted in the HPI. Denies N/V/F/Ch.  Past Medical History:  Diagnosis Date   Adenomatous colon polyp 02/20/2014   Asthma    Benign neoplasm of colon 02/20/2014   Chest pain 09/13/2013   Coronary angioplasty status 11/13/2018   Coronary artery disease    Depression    Diabetes mellitus without complication (HCC)    GERD (gastroesophageal reflux disease)    History of hiatal hernia    Hypertension    Hypokalemia 05/29/2019   Hypomagnesemia 07/02/2022   Nausea with vomiting, unspecified 06/07/2019   NSTEMI (non-ST elevated myocardial infarction) (HCC) 09/23/2018   Pain    BACK   Stroke (HCC)    x3   Thyroid cyst    TIA (transient ischemic attack) 08/06/2017   Unstable angina (HCC) 10/20/2017   Weight loss 06/07/2019    Current Outpatient Medications:    Accu-Chek Softclix Lancets lancets, USE TO TEST SUGARS TWICE DAILY, Disp: 200 each, Rfl: 3   acetaminophen (TYLENOL) 500 MG tablet, Take 1,000 mg by mouth every 6 (six) hours as needed for mild pain or headache., Disp: , Rfl:    aspirin EC 81 MG tablet, Take 81 mg by mouth daily., Disp: , Rfl:    atorvastatin (LIPITOR) 80 MG tablet, TAKE 1 TABLET EVERY DAY, Disp: 90 tablet, Rfl: 3   Blood Glucose Monitoring Suppl (ACCU-CHEK GUIDE ME) w/Device KIT, , Disp: , Rfl:    chlorthalidone (HYGROTON) 25 MG  tablet, TAKE 1 TABLET EVERY DAY, Disp: 90 tablet, Rfl: 3   clonazePAM (KLONOPIN) 0.5 MG tablet, Take 1 tablet (0.5 mg total) by mouth 2 (two) times daily as needed for anxiety., Disp: 60 tablet, Rfl: 0   clopidogrel (PLAVIX) 75 MG tablet, TAKE 1 TABLET EVERY DAY, Disp: 90 tablet, Rfl: 3   fluticasone (FLONASE) 50 MCG/ACT nasal spray, USE 2 SPRAYS IN EACH NOSTRIL EVERY DAY, Disp: 48 g, Rfl: 3   glucose blood (ACCU-CHEK GUIDE) test strip, Blood Glucose Test In Vitro Strip QTY: 100 strip Days: 90 Refills: 3  Written: 06/07/19 Patient Instructions: E11.9 - to check blodd sugars once daily, Disp: , Rfl:    isosorbide mononitrate (IMDUR) 30 MG 24 hr tablet, TAKE 1 TABLET EVERY DAY, Disp: 90 tablet, Rfl: 3   losartan (COZAAR) 25 MG tablet, TAKE 1 TABLET EVERY DAY, Disp: 90 tablet, Rfl: 3   methocarbamol (ROBAXIN) 500 MG tablet, Take 500 mg by mouth at bedtime as needed for muscle spasms., Disp: , Rfl:    metoprolol succinate (TOPROL XL) 25 MG 24 hr tablet, Take 1 tablet (25 mg total) by mouth daily., Disp: 30 tablet, Rfl: 11   metoprolol succinate (TOPROL-XL) 50 MG 24 hr tablet, TAKE 1 TABLET EVERY DAY, Disp: 90 tablet, Rfl: 3   nitroGLYCERIN (NITROSTAT) 0.4 MG SL tablet, Place 1 tablet (0.4 mg total)  under the tongue every 5 (five) minutes x 3 doses as needed for chest pain. (Patient not taking: Reported on 03/13/2024), Disp: 25 tablet, Rfl: 0   nitroGLYCERIN (NITROSTAT) 0.6 MG SL tablet, USE 1 TABLET UNDER TONGUE AS NEEDED FOR CHEST PAIN. REPEAT IN 5 MIN IF NO RELIEF. IF MORE THAN 2 TABS NEEDED, CALL 911, Disp: 100 tablet, Rfl: 3   ondansetron (ZOFRAN-ODT) 4 MG disintegrating tablet, DISSOLVE 1 TABLET IN MOUTH UP TO EVERY 8 HOURS AS NEEDED FOR NAUSEA, Disp: 90 tablet, Rfl: 3   pantoprazole (PROTONIX) 40 MG tablet, TAKE 1 TABLET BY MOUTH EVERY MORNING 30 MINS BEFORE FOOD, Disp: 90 tablet, Rfl: 3   potassium chloride SA (KLOR-CON M) 20 MEQ tablet, TAKE 1 TABLET EVERY DAY, Disp: 90 tablet, Rfl: 3   ranolazine  (RANEXA) 500 MG 12 hr tablet, TAKE 1 TABLET TWICE DAILY, Disp: 180 tablet, Rfl: 3   RYBELSUS 3 MG TABS, TAKE 1 TABLET EVERY DAY, Disp: 90 tablet, Rfl: 3   sucralfate (CARAFATE) 1 g tablet, TAKE 1 TABLET BY MOUTH 4 TIMES DAILY., Disp: 360 tablet, Rfl: 1   Zinc Oxide 16 % OINT, Apply 1 Application topically 4 (four) times daily as needed., Disp: 454 g, Rfl: 3  Social History   Tobacco Use  Smoking Status Former   Current packs/day: 0.00   Types: Cigarettes   Quit date: 09/13/1998   Years since quitting: 25.5  Smokeless Tobacco Never    Allergies  Allergen Reactions   Lisinopril Swelling and Shortness Of Breath   Meloxicam Nausea Only and Nausea And Vomiting   Tramadol Nausea Only and Other (See Comments)    sleepiness  Other reaction(s): Dizziness, Other, Other (See Comments)  somnolence  Other reaction(s): Other (See Comments), Other (See Comments)  Would not wake up after taking this medication   Amoxicillin Rash    Don't remember  Don't remember  Don't remember    Amoxicillin-Pot Clavulanate Hives and Rash   Clavulanic Acid Rash   Objective:  There were no vitals filed for this visit. There is no height or weight on file to calculate BMI. Constitutional Well developed. Well nourished.  Vascular Dorsalis pedis pulses palpable bilaterally. Posterior tibial pulses palpable bilaterally. Capillary refill normal to all digits.  No cyanosis or clubbing noted. Pedal hair growth normal.  Neurologic Normal speech. Oriented to person, place, and time. Epicritic sensation to light touch grossly present bilaterally.  Dermatologic Nails well groomed and normal in appearance. No open wounds. No skin lesions.  Orthopedic: Normal joint ROM without pain or crepitus bilaterally. No visible deformities. Tender to palpation at the calcaneal tuber bilaterally. No pain with calcaneal squeeze bilaterally. Ankle ROM diminished range of motion bilaterally. Silfverskiold Test: positive  bilaterally.   Radiographs: None  Assessment:   No diagnosis found.  Plan:  Patient was evaluated and treated and all questions answered.  Plantar Fasciitis, bilaterally with underlying gastrocnemius equinus bilateral - XR reviewed as above.  - Educated on icing and stretching. Instructions given.  - Second injection delivered to the plantar fascia as below. - DME: Plantar fascial brace dispensed to support the medial longitudinal arch of the foot and offload pressure from the heel and prevent arch collapse during weightbearing - Pharmacologic management: None  Pes planovalgus -I explained to patient the etiology of pes planovalgus and relationship with Planter fasciitis and various treatment options were discussed.  Given patient foot structure in the setting of Planter fasciitis I believe patient will benefit from custom-made orthotics to help  control the hindfoot motion support the arch of the foot and take the stress away from plantar fascial.  Patient agrees with the plan like to proceed with orthotics -Patient would benefit from power step insoles power step was discussed   Procedure: Injection Tendon/Ligament Location: Bilateral plantar fascia at the glabrous junction; medial approach. Skin Prep: alcohol Injectate: 0.5 cc 0.5% marcaine plain, 0.5 cc of 1% Lidocaine, 0.5 cc kenalog 10. Disposition: Patient tolerated procedure well. Injection site dressed with a band-aid.  No follow-ups on file.

## 2024-04-15 ENCOUNTER — Other Ambulatory Visit: Payer: Self-pay | Admitting: Cardiovascular Disease

## 2024-06-10 ENCOUNTER — Other Ambulatory Visit: Payer: Self-pay

## 2024-06-10 ENCOUNTER — Emergency Department
Admission: EM | Admit: 2024-06-10 | Discharge: 2024-06-10 | Disposition: A | Discharge status: Home / Self Care | Attending: Emergency Medicine | Admitting: Emergency Medicine

## 2024-06-10 ENCOUNTER — Emergency Department

## 2024-06-10 DIAGNOSIS — Z23 Encounter for immunization: Secondary | ICD-10-CM | POA: Insufficient documentation

## 2024-06-10 DIAGNOSIS — W208XXA Other cause of strike by thrown, projected or falling object, initial encounter: Secondary | ICD-10-CM | POA: Insufficient documentation

## 2024-06-10 DIAGNOSIS — E119 Type 2 diabetes mellitus without complications: Secondary | ICD-10-CM | POA: Insufficient documentation

## 2024-06-10 DIAGNOSIS — S0990XA Unspecified injury of head, initial encounter: Secondary | ICD-10-CM

## 2024-06-10 DIAGNOSIS — S0181XA Laceration without foreign body of other part of head, initial encounter: Secondary | ICD-10-CM | POA: Insufficient documentation

## 2024-06-10 DIAGNOSIS — Z043 Encounter for examination and observation following other accident: Secondary | ICD-10-CM | POA: Diagnosis not present

## 2024-06-10 DIAGNOSIS — I2510 Atherosclerotic heart disease of native coronary artery without angina pectoris: Secondary | ICD-10-CM | POA: Diagnosis not present

## 2024-06-10 DIAGNOSIS — H26493 Other secondary cataract, bilateral: Secondary | ICD-10-CM | POA: Diagnosis not present

## 2024-06-10 DIAGNOSIS — S0081XA Abrasion of other part of head, initial encounter: Secondary | ICD-10-CM | POA: Diagnosis not present

## 2024-06-10 MED ORDER — ACETAMINOPHEN 325 MG PO TABS
650.0000 mg | ORAL_TABLET | Freq: Once | ORAL | Status: AC
  Administered 2024-06-10: 650 mg via ORAL
  Filled 2024-06-10: qty 2

## 2024-06-10 MED ORDER — TETANUS-DIPHTH-ACELL PERTUSSIS 5-2.5-18.5 LF-MCG/0.5 IM SUSY
0.5000 mL | PREFILLED_SYRINGE | Freq: Once | INTRAMUSCULAR | Status: AC
  Administered 2024-06-10: 0.5 mL via INTRAMUSCULAR
  Filled 2024-06-10: qty 0.5

## 2024-06-10 MED ORDER — LIDOCAINE-EPINEPHRINE-TETRACAINE (LET) TOPICAL GEL
3.0000 mL | Freq: Once | TOPICAL | Status: AC
Start: 2024-06-10 — End: 2024-06-10
  Administered 2024-06-10: 3 mL via TOPICAL
  Filled 2024-06-10: qty 3

## 2024-06-10 NOTE — Discharge Instructions (Signed)
 Keep the areas dry as possible. Return emergency department if worsening headache, vomiting etc. The Dermabond will peel off on its own in about 5 to 7 days.

## 2024-06-10 NOTE — ED Provider Notes (Signed)
 [facility] Provider Note    Event Date/Time   First MD Initiated Contact with Patient 06/10/24 2057     (approximate)   History   Laceration   HPI  Kayla Malone is a 63 y.o. female history of diabetes, CVA, CAD, lung other chronic health problems, see past medical history presents to the emergency department with complaints of a head injury.  Patient was cleaning a window when the bracket came loose and the window fell hitting her on the forehead.  Patient is on Plavix .  No LOC.  The glass did not break.  Unsure of her last Tdap.      Physical Exam   Triage Vital Signs: ED Triage Vitals [06/10/24 2045]  Encounter Vitals Group     BP 136/77     Girls Systolic BP Percentile      Girls Diastolic BP Percentile      Boys Systolic BP Percentile      Boys Diastolic BP Percentile      Pulse Rate 64     Resp 18     Temp 98 F (36.7 C)     Temp src      SpO2 100 %     Weight      Height      Head Circumference      Peak Flow      Pain Score      Pain Loc      Pain Education      Exclude from Growth Chart     Most recent vital signs: Vitals:   06/10/24 2045  BP: 136/77  Pulse: 64  Resp: 18  Temp: 98 F (36.7 C)  SpO2: 100%     General: Awake, no distress.   CV:  Good peripheral perfusion.  Resp:  Normal effort.  Abd:  No distention.   Other:  Forehead with 2 cm laceration, bleeding is controlled, area is superficial, cranial nerves II through XII grossly intact   ED Results / Procedures / Treatments   Labs (all labs ordered are listed, but only abnormal results are displayed) Labs Reviewed - No data to display   EKG     RADIOLOGY CT of the head    PROCEDURES:   .Laceration Repair  Date/Time: 06/10/2024 9:58 PM  Performed by: Gasper Devere ORN, PA-C Authorized by: Gasper Devere ORN, PA-C   Consent:    Consent obtained:  Verbal   Consent given by:  Patient   Risks, benefits, and alternatives were discussed:  yes     Risks discussed:  Infection, pain, retained foreign body, tendon damage, poor cosmetic result, need for additional repair, nerve damage, poor wound healing and vascular damage   Alternatives discussed:  No treatment Universal protocol:    Procedure explained and questions answered to patient or proxy's satisfaction: yes     Immediately prior to procedure, a time out was called: yes     Patient identity confirmed:  Verbally with patient Anesthesia:    Anesthesia method:  Topical application   Topical anesthetic:  LET Laceration details:    Location:  Face   Face location:  Forehead   Length (cm):  2 Pre-procedure details:    Preparation:  Patient was prepped and draped in usual sterile fashion Exploration:    Limited defect created (wound extended): no     Hemostasis achieved with:  LET   Imaging outcome: foreign body not noted     Wound exploration: entire depth of  wound visualized     Wound extent: areolar tissue not violated, fascia not violated, no foreign body, no signs of injury, no nerve damage, no tendon damage, no underlying fracture and no vascular damage     Contaminated: no   Treatment:    Area cleansed with:  Saline   Amount of cleaning:  Standard   Irrigation solution:  Sterile saline   Irrigation method:  Tap   Debridement:  None   Undermining:  None Skin repair:    Repair method:  Tissue adhesive Approximation:    Approximation:  Close Repair type:    Repair type:  Simple Post-procedure details:    Dressing:  Open (no dressing)   Procedure completion:  Tolerated well, no immediate complications   Critical Care:  no Chief Complaint  Patient presents with   Laceration      MEDICATIONS ORDERED IN ED: Medications  lidocaine -EPINEPHrine -tetracaine (LET) topical gel (3 mLs Topical Given 06/10/24 2133)  acetaminophen  (TYLENOL ) tablet 650 mg (650 mg Oral Given 06/10/24 2137)  Tdap (BOOSTRIX) injection 0.5 mL (0.5 mLs Intramuscular Given 06/10/24 2138)      IMPRESSION / MDM / ASSESSMENT AND PLAN / ED COURSE  I reviewed the triage vital signs and the nursing notes.                              Differential diagnosis includes, but is not limited to, contusion, subdural, SAH, laceration, avulsion  Patient's presentation is most consistent with acute illness / injury with system symptoms.    Medications given: Tdap updated, L ET for anesthetic  See procedure note for laceration repair  CT of the head was independently reviewed interpreted by me as being negative for any acute abnormality   Patient tolerated procedure well.  Dermabond instructions were given.  She is to follow-up with her regular doctor as needed.  Return emergency department if worsening.  In agreement treatment plan.  Discharged stable condition.   FINAL CLINICAL IMPRESSION(S) / ED DIAGNOSES   Final diagnoses:  Minor head injury, initial encounter  Laceration of forehead, initial encounter     Rx / DC Orders   ED Discharge Orders     None        Note:  This document was prepared using Dragon voice recognition software and may include unintentional dictation errors.    Gasper Devere ORN, PA-C 06/10/24 2200    Jacolyn Pae, MD 06/10/24 [phone]

## 2024-06-10 NOTE — ED Triage Notes (Signed)
 Pt reports she was cleaning windows and one fell on her head, pt has lac to forehead, bleeding controled. Pt reports she is taking plavix .

## 2024-06-12 ENCOUNTER — Encounter: Payer: Self-pay | Admitting: Family

## 2024-06-12 ENCOUNTER — Ambulatory Visit: Admitting: Family

## 2024-06-12 ENCOUNTER — Ambulatory Visit: Payer: Self-pay | Admitting: Family

## 2024-06-12 VITALS — BP 117/81 | HR 63 | Ht 63.0 in | Wt 129.8 lb

## 2024-06-12 DIAGNOSIS — E559 Vitamin D deficiency, unspecified: Secondary | ICD-10-CM

## 2024-06-12 DIAGNOSIS — E1165 Type 2 diabetes mellitus with hyperglycemia: Secondary | ICD-10-CM | POA: Diagnosis not present

## 2024-06-12 DIAGNOSIS — E782 Mixed hyperlipidemia: Secondary | ICD-10-CM | POA: Diagnosis not present

## 2024-06-12 DIAGNOSIS — I69359 Hemiplegia and hemiparesis following cerebral infarction affecting unspecified side: Secondary | ICD-10-CM | POA: Diagnosis not present

## 2024-06-12 DIAGNOSIS — E538 Deficiency of other specified B group vitamins: Secondary | ICD-10-CM

## 2024-06-12 DIAGNOSIS — T82857D Stenosis of cardiac prosthetic devices, implants and grafts, subsequent encounter: Secondary | ICD-10-CM | POA: Diagnosis not present

## 2024-06-12 DIAGNOSIS — I10 Essential (primary) hypertension: Secondary | ICD-10-CM | POA: Diagnosis not present

## 2024-06-12 DIAGNOSIS — R5383 Other fatigue: Secondary | ICD-10-CM | POA: Diagnosis not present

## 2024-06-12 DIAGNOSIS — E041 Nontoxic single thyroid nodule: Secondary | ICD-10-CM | POA: Diagnosis not present

## 2024-06-12 DIAGNOSIS — E860 Dehydration: Secondary | ICD-10-CM | POA: Diagnosis not present

## 2024-06-12 LAB — POC CREATINE & ALBUMIN,URINE
Albumin/Creatinine Ratio, Urine, POC: <30
Creatinine, POC: 50 mg/dL
Microalbumin Ur, POC: 10 mg/L

## 2024-06-12 LAB — POCT CBG (FASTING - GLUCOSE)-MANUAL ENTRY: Glucose Fasting, POC: 122 mg/dL — AB (ref 70–99)

## 2024-06-12 NOTE — Assessment & Plan Note (Signed)
 Checking labs today.  Continue current therapy for lipid control. Will modify as needed based on labwork results.   -CMP w/eGFR -Lipid Panel

## 2024-06-12 NOTE — Assessment & Plan Note (Signed)
 Checking labs today.  Will continue supplements as needed.   - Vitamin D  - Vitamin B12 - TSH

## 2024-06-12 NOTE — Assessment & Plan Note (Signed)
 Blood pressure well controlled with current medications.  Continue current therapy.  Will reassess at follow up.   - CBC w/Diff - CMP w/eGFR

## 2024-06-12 NOTE — Assessment & Plan Note (Signed)
 Patient is seen by Cardiology, who manage this condition.  She is well controlled with current therapy.   Will defer to them for further changes to plan of care.

## 2024-06-12 NOTE — Assessment & Plan Note (Signed)
 Patient stable.  Well controlled with current therapy.   Continue current meds.

## 2024-06-12 NOTE — Progress Notes (Signed)
 Established Patient Office Visit  Subjective:  Patient ID: Kayla Malone, female    DOB: [birth year]  Age: 63 y.o. MRN: [id number]  Chief Complaint  Patient presents with   Follow-up    3 month follow up    Patient is here today for her 3 months follow up.  She has been feeling fairly well since last appointment.   She does not have additional concerns to discuss today.  Labs are due today. She needs refills.   I have reviewed her active problem list, medication list, allergies, health maintenance, notes from last encounter, lab results for her appointment today.      No other concerns at this time.   Past Medical History:  Diagnosis Date   Adenomatous colon polyp 02/20/2014   Asthma    Benign neoplasm of colon 02/20/2014   Chest pain 09/13/2013   Coronary angioplasty status 11/13/2018   Coronary artery disease    Depression    Diabetes mellitus without complication (HCC)    GERD (gastroesophageal reflux disease)    History of hiatal hernia    Hypertension    Hypokalemia 05/29/2019   Hypomagnesemia 07/02/2022   Nausea with vomiting, unspecified 06/07/2019   NSTEMI (non-ST elevated myocardial infarction) (HCC) 09/23/2018   Pain    BACK   Stroke (HCC)    x3   Thyroid  cyst    TIA (transient ischemic attack) 08/06/2017   Unstable angina (HCC) 10/20/2017   Weight loss 06/07/2019    Past Surgical History:  Procedure Laterality Date   ABDOMINAL HYSTERECTOMY     CARDIAC SURGERY     CATARACT EXTRACTION W/PHACO Left 08/15/2017   Procedure: CATARACT EXTRACTION PHACO AND INTRAOCULAR LENS PLACEMENT (IOC);  Surgeon: Jaye Fallow, MD;  Location: [facility] ORS;  Service: Ophthalmology;  Laterality: Left;  US  00:40.6 AP% 7.3 CDE 2.98 FLUID PACK LOT # [id number]   CORONARY ARTERY BYPASS GRAFT N/A 10/23/2017   Procedure: CORONARY ARTERY BYPASS GRAFTING (CABG) x two  , using left internal mammary artery and right leg greater saphenous vein harvested  endoscopically -  LIMA to LAD, SVG to RCA;  Surgeon: Army Dallas NOVAK, MD;  Location: [facility] OR;  Service: Open Heart Surgery;  Laterality: N/A;   CORONARY STENT INTERVENTION N/A 09/24/2018   Procedure: CORONARY STENT INTERVENTION;  Surgeon: Dann Candyce RAMAN, MD;  Location: [facility] INVASIVE CV LAB;  Service: Cardiovascular;  Laterality: N/A;   ESOPHAGOGASTRODUODENOSCOPY N/A 05/30/2019   Procedure: ESOPHAGOGASTRODUODENOSCOPY (EGD);  Surgeon: Toledo, Ladell POUR, MD;  Location: [facility] ENDOSCOPY;  Service: Gastroenterology;  Laterality: N/A;   EYE SURGERY     LEFT HEART CATH AND CORONARY ANGIOGRAPHY Left 10/09/2017   Procedure: LEFT HEART CATH AND CORONARY ANGIOGRAPHY;  Surgeon: Fernand Denyse LABOR, MD;  Location: [facility] INVASIVE CV LAB;  Service: Cardiovascular;  Laterality: Left;   LEFT HEART CATH AND CORS/GRAFTS ANGIOGRAPHY N/A 09/24/2018   Procedure: LEFT HEART CATH AND CORS/GRAFTS ANGIOGRAPHY;  Surgeon: Dann Candyce RAMAN, MD;  Location: [facility] INVASIVE CV LAB;  Service: Cardiovascular;  Laterality: N/A;   LEFT HEART CATH AND CORS/GRAFTS ANGIOGRAPHY N/A 07/04/2022   Procedure: LEFT HEART CATH AND CORS/GRAFTS ANGIOGRAPHY;  Surgeon: Fernand Denyse LABOR, MD;  Location: [facility] INVASIVE CV LAB;  Service: Cardiovascular;  Laterality: N/A;   TEE WITHOUT CARDIOVERSION N/A 10/23/2017   Procedure: TRANSESOPHAGEAL ECHOCARDIOGRAM (TEE);  Surgeon: Army Dallas NOVAK, MD;  Location: [facility] OR;  Service: Open Heart Surgery;  Laterality: N/A;    Social History   Socioeconomic History   Marital status: Married  Spouse name: Not on file   Number of children: Not on file   Years of education: Not on file   Highest education level: Not on file  Occupational History   Not on file  Tobacco Use   Smoking status: Former    Current packs/day: 0.00    Types: Cigarettes    Quit date: 09/13/1998    Years since quitting: 25.7   Smokeless tobacco: Never  Vaping Use   Vaping status: Never Used  Substance and Sexual Activity   Alcohol use: No   Drug  use: No   Sexual activity: Yes    Birth control/protection: Post-menopausal  Other Topics Concern   Not on file  Social History Narrative   Not on file   Social Drivers of Health   Financial Resource Strain: Low Risk  (12/10/2023)   Overall Financial Resource Strain (CARDIA)    Difficulty of Paying Living Expenses: Not hard at all  Food Insecurity: No Food Insecurity (11/30/2023)   Hunger Vital Sign    Worried About Running Out of Food in the Last Year: Never true    Ran Out of Food in the Last Year: Never true  Transportation Needs: No Transportation Needs (12/10/2023)   PRAPARE - Administrator, Civil Service (Medical): No    Lack of Transportation (Non-Medical): No  Physical Activity: Insufficiently Active (11/30/2023)   Exercise Vital Sign    Days of Exercise per Week: 5 days    Minutes of Exercise per Session: 10 min  Stress: No Stress Concern Present (11/30/2023)   [employer] of Occupational Health - Occupational Stress Questionnaire    Feeling of Stress : Not at all  Social Connections: Moderately Isolated (12/10/2023)   Social Connection and Isolation Panel    Frequency of Communication with Friends and Family: More than three times a week    Frequency of Social Gatherings with Friends and Family: More than three times a week    Attends Religious Services: More than 4 times per year    Active Member of [employer] or Organizations: No    Attends Banker Meetings: Never    Marital Status: Widowed  Intimate Partner Violence: Not At Risk (11/30/2023)   Humiliation, Afraid, Rape, and Kick questionnaire    Fear of Current or Ex-Partner: No    Emotionally Abused: No    Physically Abused: No    Sexually Abused: No    Family History  Problem Relation Age of Onset   Heart failure Mother    Pulmonary embolism Mother    Cirrhosis Father    Coronary artery disease Father    Diabetes type II Father    Hypertension Father    Kidney disease Father     Coronary artery disease Sister    Stroke Sister    COPD Brother    Coronary artery disease Brother    Breast cancer Neg Hx     Allergies  Allergen Reactions   Lisinopril Swelling and Shortness Of Breath   Meloxicam  Nausea Only and Nausea And Vomiting   Tramadol Nausea Only and Other (See Comments)    sleepiness  Other reaction(s): Dizziness, Other, Other (See Comments)  somnolence  Other reaction(s): Other (See Comments), Other (See Comments)  Would not wake up after taking this medication   Amoxicillin Rash and Dermatitis    Don't remember   Don't remember   Amoxicillin-Pot Clavulanate Hives, Rash and Dermatitis   Clavulanic Acid Rash    Review of Systems  Skin:        Healing laceration to forehead.   All other systems reviewed and are negative.      Objective:   BP 117/81   Pulse 63   Ht 5' 3 (1.6 m)   Wt 129 lb 12.8 oz (58.9 kg)   SpO2 99%   BMI 22.99 kg/m   Vitals:   06/12/24 0902  BP: 117/81  Pulse: 63  Height: 5' 3 (1.6 m)  Weight: 129 lb 12.8 oz (58.9 kg)  SpO2: 99%  BMI (Calculated): 23    Physical Exam Vitals and nursing note reviewed.  Constitutional:      Appearance: Normal appearance. She is normal weight.  HENT:     Head: Normocephalic.  Eyes:     Extraocular Movements: Extraocular movements intact.     Conjunctiva/sclera: Conjunctivae normal.     Pupils: Pupils are equal, round, and reactive to light.  Cardiovascular:     Rate and Rhythm: Normal rate.  Pulmonary:     Effort: Pulmonary effort is normal.  Neurological:     General: No focal deficit present.     Mental Status: She is alert and oriented to person, place, and time. Mental status is at baseline.  Psychiatric:        Mood and Affect: Mood normal.        Behavior: Behavior normal.        Thought Content: Thought content normal.        Judgment: Judgment normal.      Results for orders placed or performed in visit on 06/12/24  POCT CBG (Fasting - Glucose)   Result Value Ref Range   Glucose Fasting, POC 122 (A) 70 - 99 mg/dL    Recent Results (from the past 2160 hours)  POCT CBG (Fasting - Glucose)     Status: Abnormal   Collection Time: 06/12/24  9:13 AM  Result Value Ref Range   Glucose Fasting, POC 122 (A) 70 - 99 mg/dL       Assessment & Plan Type 2 diabetes mellitus with hyperglycemia, without long-term current use of insulin  (HCC) Checking labs today. Will call pt. With results  Continue current diabetes POC, as patient has been well controlled on current regimen.  Will adjust meds if needed based on labs.   -CBC w/Diff -CMP w/eGFR -Hemoglobin A1C  CVA, old, hemiparesis (HCC) Patient stable.  Well controlled with current therapy.   Continue current meds.   Benign essential hypertension Blood pressure well controlled with current medications.  Continue current therapy.  Will reassess at follow up.   - CBC w/Diff - CMP w/eGFR  Mixed hyperlipidemia Checking labs today.  Continue current therapy for lipid control. Will modify as needed based on labwork results.   -CMP w/eGFR -Lipid Panel  B12 deficiency due to diet Vitamin D  deficiency, unspecified Other fatigue Nontoxic uninodular goiter Checking labs today.  Will continue supplements as needed.   - Vitamin D  - Vitamin B12 - TSH  Coronary graft stenosis, subsequent encounter Patient is seen by Cardiology, who manage this condition.  She is well controlled with current therapy.   Will defer to them for further changes to plan of care.     Return in about 3 months (around 09/12/2024).   Total time spent: 20 minutes  ALAN CHRISTELLA ARRANT, FNP  06/12/2024   This document may have been prepared by [facility] Voice Recognition software and as such may include unintentional dictation errors.

## 2024-06-12 NOTE — Assessment & Plan Note (Signed)
 Checking labs today. Will call pt. With results  Continue current diabetes POC, as patient has been well controlled on current regimen.  Will adjust meds if needed based on labs.   -CBC w/Diff -CMP w/eGFR -Hemoglobin A1C

## 2024-06-13 DIAGNOSIS — M778 Other enthesopathies, not elsewhere classified: Secondary | ICD-10-CM | POA: Diagnosis not present

## 2024-06-13 DIAGNOSIS — Z8639 Personal history of other endocrine, nutritional and metabolic disease: Secondary | ICD-10-CM | POA: Diagnosis not present

## 2024-06-13 DIAGNOSIS — Z7901 Long term (current) use of anticoagulants: Secondary | ICD-10-CM | POA: Diagnosis not present

## 2024-06-13 LAB — CBC WITH DIFFERENTIAL/PLATELET
Basophils Absolute: 0.1 10*3/uL (ref 0.0–0.2)
Basos: 1 %
EOS (ABSOLUTE): 0.1 10*3/uL (ref 0.0–0.4)
Eos: 2 %
Hematocrit: 36.9 % (ref 34.0–46.6)
Hemoglobin: 12.1 g/dL (ref 11.1–15.9)
Immature Grans (Abs): 0 10*3/uL (ref 0.0–0.1)
Immature Granulocytes: 0 %
Lymphocytes Absolute: 1.8 10*3/uL (ref 0.7–3.1)
Lymphs: 45 %
MCH: 28.5 pg (ref 26.6–33.0)
MCHC: 32.8 g/dL (ref 31.5–35.7)
MCV: 87 fL (ref 79–97)
Monocytes Absolute: 0.3 10*3/uL (ref 0.1–0.9)
Monocytes: 8 %
Neutrophils Absolute: 1.8 10*3/uL (ref 1.4–7.0)
Neutrophils: 44 %
Platelets: 337 10*3/uL (ref 150–450)
RBC: 4.24 x10E6/uL (ref 3.77–5.28)
RDW: 12.2 % (ref 11.7–15.4)
WBC: 4 10*3/uL (ref 3.4–10.8)

## 2024-06-13 LAB — CMP14+EGFR
ALT: 7 IU/L (ref 0–32)
AST: 12 IU/L (ref 0–40)
Albumin: 4.2 g/dL (ref 3.9–4.9)
Alkaline Phosphatase: 80 IU/L (ref 44–121)
BUN/Creatinine Ratio: 12 (ref 12–28)
BUN: 13 mg/dL (ref 8–27)
Bilirubin Total: 0.5 mg/dL (ref 0.0–1.2)
CO2: 26 mmol/L (ref 20–29)
Calcium: 9.3 mg/dL (ref 8.7–10.3)
Chloride: 100 mmol/L (ref 96–106)
Creatinine, Ser: 1.06 mg/dL — ABNORMAL HIGH (ref 0.57–1.00)
Globulin, Total: 2.6 g/dL (ref 1.5–4.5)
Glucose: 123 mg/dL — ABNORMAL HIGH (ref 70–99)
Potassium: 3.8 mmol/L (ref 3.5–5.2)
Sodium: 140 mmol/L (ref 134–144)
Total Protein: 6.8 g/dL (ref 6.0–8.5)
eGFR: 59 mL/min/{1.73_m2} — ABNORMAL LOW (ref >59)

## 2024-06-13 LAB — IRON,TIBC AND FERRITIN PANEL
Ferritin: 40 ng/mL (ref 15–150)
Iron Saturation: 22 % (ref 15–55)
Iron: 71 ug/dL (ref 27–139)
Total Iron Binding Capacity: 318 ug/dL (ref 250–450)
UIBC: 247 ug/dL (ref 118–369)

## 2024-06-13 LAB — VITAMIN D 25 HYDROXY (VIT D DEFICIENCY, FRACTURES): Vit D, 25-Hydroxy: 35.5 ng/mL (ref 30.0–100.0)

## 2024-06-13 LAB — LIPID PANEL
Chol/HDL Ratio: 2.7 ratio (ref 0.0–4.4)
Cholesterol, Total: 155 mg/dL (ref 100–199)
HDL: 58 mg/dL (ref >39)
LDL Chol Calc (NIH): 85 mg/dL (ref 0–99)
Triglycerides: 58 mg/dL (ref 0–149)
VLDL Cholesterol Cal: 12 mg/dL (ref 5–40)

## 2024-06-13 LAB — HEMOGLOBIN A1C
Est. average glucose Bld gHb Est-mCnc: 140 mg/dL
Hgb A1c MFr Bld: 6.5 % — ABNORMAL HIGH (ref 4.8–5.6)

## 2024-06-13 LAB — VITAMIN B12: Vitamin B-12: >2000 pg/mL — ABNORMAL HIGH (ref 232–1245)

## 2024-06-13 LAB — TSH: TSH: 1.93 u[IU]/mL (ref 0.450–4.500)

## 2024-06-17 ENCOUNTER — Encounter: Payer: Self-pay | Admitting: Family

## 2024-06-18 ENCOUNTER — Encounter: Payer: Self-pay | Admitting: Cardiovascular Disease

## 2024-06-18 ENCOUNTER — Ambulatory Visit (INDEPENDENT_AMBULATORY_CARE_PROVIDER_SITE_OTHER): Admitting: Cardiovascular Disease

## 2024-06-18 VITALS — BP 115/70 | HR 52 | Ht 63.0 in | Wt 129.2 lb

## 2024-06-18 DIAGNOSIS — I69359 Hemiplegia and hemiparesis following cerebral infarction affecting unspecified side: Secondary | ICD-10-CM

## 2024-06-18 DIAGNOSIS — R001 Bradycardia, unspecified: Secondary | ICD-10-CM | POA: Diagnosis not present

## 2024-06-18 DIAGNOSIS — I25118 Atherosclerotic heart disease of native coronary artery with other forms of angina pectoris: Secondary | ICD-10-CM | POA: Diagnosis not present

## 2024-06-18 DIAGNOSIS — I10 Essential (primary) hypertension: Secondary | ICD-10-CM

## 2024-06-18 DIAGNOSIS — E782 Mixed hyperlipidemia: Secondary | ICD-10-CM | POA: Diagnosis not present

## 2024-06-18 MED ORDER — METOPROLOL SUCCINATE ER 25 MG PO TB24: 25.0000 mg | ORAL_TABLET | Freq: Every day | ORAL | 11 refills | Status: AC

## 2024-06-18 NOTE — Telephone Encounter (Signed)
 Patient informed per Kayla Malone verbal she does not want to do anything at the moment since her number are trending downward. Just keep watching what she's eating

## 2024-06-18 NOTE — Progress Notes (Signed)
 Cardiology Office Note   Date:  06/18/2024   ID:  Kayla Malone, DOB 02-25-1961, MRN [id number]  PCP:  Orlean Alan HERO, FNP  Cardiologist:  Denyse Bathe, MD      History of Present Illness: Kayla Malone is a 63 y.o. female who presents for  Chief Complaint  Patient presents with   Follow-up    3 months    Feeling fine, no chest pains/sOB.      Past Medical History:  Diagnosis Date   Adenomatous colon polyp 02/20/2014   Asthma    Benign neoplasm of colon 02/20/2014   Chest pain 09/13/2013   Coronary angioplasty status 11/13/2018   Coronary artery disease    Depression    Diabetes mellitus without complication (HCC)    GERD (gastroesophageal reflux disease)    History of hiatal hernia    Hypertension    Hypokalemia 05/29/2019   Hypomagnesemia 07/02/2022   Nausea with vomiting, unspecified 06/07/2019   NSTEMI (non-ST elevated myocardial infarction) (HCC) 09/23/2018   Pain    BACK   Stroke (HCC)    x3   Thyroid  cyst    TIA (transient ischemic attack) 08/06/2017   Unstable angina (HCC) 10/20/2017   Weight loss 06/07/2019     Past Surgical History:  Procedure Laterality Date   ABDOMINAL HYSTERECTOMY     CARDIAC SURGERY     CATARACT EXTRACTION W/PHACO Left 08/15/2017   Procedure: CATARACT EXTRACTION PHACO AND INTRAOCULAR LENS PLACEMENT (IOC);  Surgeon: Jaye Fallow, MD;  Location: [facility] ORS;  Service: Ophthalmology;  Laterality: Left;  US  00:40.6 AP% 7.3 CDE 2.98 FLUID PACK LOT # [id number]   CORONARY ARTERY BYPASS GRAFT N/A 10/23/2017   Procedure: CORONARY ARTERY BYPASS GRAFTING (CABG) x two  , using left internal mammary artery and right leg greater saphenous vein harvested  endoscopically - LIMA to LAD, SVG to RCA;  Surgeon: Army Dallas NOVAK, MD;  Location: [facility] OR;  Service: Open Heart Surgery;  Laterality: N/A;   CORONARY STENT INTERVENTION N/A 09/24/2018   Procedure: CORONARY STENT INTERVENTION;  Surgeon: Dann Candyce RAMAN, MD;   Location: [facility] INVASIVE CV LAB;  Service: Cardiovascular;  Laterality: N/A;   ESOPHAGOGASTRODUODENOSCOPY N/A 05/30/2019   Procedure: ESOPHAGOGASTRODUODENOSCOPY (EGD);  Surgeon: Toledo, Ladell POUR, MD;  Location: [facility] ENDOSCOPY;  Service: Gastroenterology;  Laterality: N/A;   EYE SURGERY     LEFT HEART CATH AND CORONARY ANGIOGRAPHY Left 10/09/2017   Procedure: LEFT HEART CATH AND CORONARY ANGIOGRAPHY;  Surgeon: Bathe Denyse LABOR, MD;  Location: [facility] INVASIVE CV LAB;  Service: Cardiovascular;  Laterality: Left;   LEFT HEART CATH AND CORS/GRAFTS ANGIOGRAPHY N/A 09/24/2018   Procedure: LEFT HEART CATH AND CORS/GRAFTS ANGIOGRAPHY;  Surgeon: Dann Candyce RAMAN, MD;  Location: [facility] INVASIVE CV LAB;  Service: Cardiovascular;  Laterality: N/A;   LEFT HEART CATH AND CORS/GRAFTS ANGIOGRAPHY N/A 07/04/2022   Procedure: LEFT HEART CATH AND CORS/GRAFTS ANGIOGRAPHY;  Surgeon: Bathe Denyse LABOR, MD;  Location: [facility] INVASIVE CV LAB;  Service: Cardiovascular;  Laterality: N/A;   TEE WITHOUT CARDIOVERSION N/A 10/23/2017   Procedure: TRANSESOPHAGEAL ECHOCARDIOGRAM (TEE);  Surgeon: Army Dallas NOVAK, MD;  Location: [facility] OR;  Service: Open Heart Surgery;  Laterality: N/A;     Current Outpatient Medications  Medication Sig Dispense Refill   Accu-Chek Softclix Lancets lancets USE TO TEST SUGARS TWICE DAILY 200 each 3   acetaminophen  (TYLENOL ) 500 MG tablet Take 1,000 mg by mouth every 6 (six) hours as needed for mild pain or headache.  aspirin  EC 81 MG tablet Take 81 mg by mouth daily.     atorvastatin  (LIPITOR ) 80 MG tablet TAKE 1 TABLET EVERY DAY 90 tablet 3   Blood Glucose Monitoring Suppl (ACCU-CHEK GUIDE ME) w/Device KIT      chlorthalidone (HYGROTON) 25 MG tablet TAKE 1 TABLET EVERY DAY 90 tablet 3   clopidogrel  (PLAVIX ) 75 MG tablet TAKE 1 TABLET EVERY DAY 90 tablet 3   fluticasone  (FLONASE ) 50 MCG/ACT nasal spray USE 2 SPRAYS IN EACH NOSTRIL EVERY DAY 48 g 3   glucose blood (ACCU-CHEK GUIDE) test strip Blood  Glucose Test In Vitro Strip QTY: 100 strip Days: 90 Refills: 3  Written: 06/07/19 Patient Instructions: E11.9 - to check blodd sugars once daily     isosorbide  mononitrate (IMDUR ) 30 MG 24 hr tablet TAKE 1 TABLET EVERY DAY 90 tablet 3   losartan  (COZAAR ) 25 MG tablet TAKE 1 TABLET EVERY DAY 90 tablet 3   methocarbamol  (ROBAXIN ) 500 MG tablet Take 500 mg by mouth at bedtime as needed for muscle spasms.     metoprolol  succinate (TOPROL  XL) 25 MG 24 hr tablet Take 1 tablet (25 mg total) by mouth daily. 30 tablet 11   metoprolol  succinate (TOPROL  XL) 25 MG 24 hr tablet Take 1 tablet (25 mg total) by mouth daily. 30 tablet 11   nitroGLYCERIN  (NITROSTAT ) 0.4 MG SL tablet Place 1 tablet (0.4 mg total) under the tongue every 5 (five) minutes x 3 doses as needed for chest pain. 25 tablet 0   ondansetron  (ZOFRAN -ODT) 4 MG disintegrating tablet DISSOLVE 1 TABLET IN MOUTH UP TO EVERY 8 HOURS AS NEEDED FOR NAUSEA 90 tablet 3   pantoprazole  (PROTONIX ) 40 MG tablet TAKE 1 TABLET BY MOUTH EVERY MORNING 30 MINS BEFORE FOOD 90 tablet 3   potassium chloride  SA (KLOR-CON  M) 20 MEQ tablet TAKE 1 TABLET EVERY DAY 90 tablet 3   ranolazine (RANEXA) 500 MG 12 hr tablet TAKE 1 TABLET TWICE DAILY 180 tablet 3   sucralfate  (CARAFATE ) 1 g tablet TAKE 1 TABLET BY MOUTH 4 TIMES DAILY. 360 tablet 1   Zinc  Oxide 16 % OINT Apply 1 Application topically 4 (four) times daily as needed. 454 g 3   nitroGLYCERIN  (NITROSTAT ) 0.6 MG SL tablet USE 1 TABLET UNDER TONGUE AS NEEDED FOR CHEST PAIN. REPEAT IN 5 MIN IF NO RELIEF. IF MORE THAN 2 TABS NEEDED, CALL 911 (Patient not taking: Reported on 06/18/2024) 100 tablet 3   RYBELSUS 3 MG TABS TAKE 1 TABLET EVERY DAY (Patient not taking: Reported on 06/18/2024) 90 tablet 3   No current facility-administered medications for this visit.    Allergies:   Lisinopril, Meloxicam , Tramadol, Amoxicillin, Amoxicillin-pot clavulanate, and Clavulanic acid    Social History:   reports that she quit smoking  about 25 years ago. Her smoking use included cigarettes. She has never used smokeless tobacco. She reports that she does not drink alcohol and does not use drugs.   Family History:  family history includes COPD in her brother; Cirrhosis in her father; Coronary artery disease in her brother, father, and sister; Diabetes type II in her father; Heart failure in her mother; Hypertension in her father; Kidney disease in her father; Pulmonary embolism in her mother; Stroke in her sister.    ROS:     Review of Systems  Constitutional: Negative.   HENT: Negative.    Eyes: Negative.   Respiratory: Negative.    Gastrointestinal: Negative.   Genitourinary: Negative.   Musculoskeletal: Negative.  Skin: Negative.   Neurological: Negative.   Endo/Heme/Allergies: Negative.   Psychiatric/Behavioral: Negative.    All other systems reviewed and are negative.     All other systems are reviewed and negative.    PHYSICAL EXAM: VS:  BP 115/70   Pulse (!) 52   Ht 5' 3 (1.6 m)   Wt 129 lb 3.2 oz (58.6 kg)   SpO2 100%   BMI 22.89 kg/m  , BMI Body mass index is 22.89 kg/m. Last weight:  Wt Readings from Last 3 Encounters:  06/18/24 129 lb 3.2 oz (58.6 kg)  06/12/24 129 lb 12.8 oz (58.9 kg)  03/15/24 124 lb (56.2 kg)     Physical Exam Constitutional:      Appearance: Normal appearance.  Cardiovascular:     Rate and Rhythm: Normal rate and regular rhythm.     Heart sounds: Normal heart sounds.  Pulmonary:     Effort: Pulmonary effort is normal.     Breath sounds: Normal breath sounds.  Musculoskeletal:     Right lower leg: No edema.     Left lower leg: No edema.  Neurological:     Mental Status: She is alert.       EKG:   Recent Labs: 06/12/2024: ALT 7; BUN 13; Creatinine, Ser 1.06; Hemoglobin 12.1; Platelets 337; Potassium 3.8; Sodium 140; TSH 1.930    Lipid Panel    Component Value Date/Time   CHOL 155 06/12/2024 0939   TRIG 58 06/12/2024 0939   HDL 58 06/12/2024 0939    CHOLHDL 2.7 06/12/2024 0939   CHOLHDL 4.0 07/04/2022 0623   VLDL 14 07/04/2022 0623   LDLCALC 85 06/12/2024 0939      Other studies Reviewed: Additional studies/ records that were reviewed today include:  Review of the above records demonstrates:       No data to display            ASSESSMENT AND PLAN:    ICD-10-CM   1. Sinus bradycardia  R00.1    change metoprolol  from 50 to 25 mg daily.    2. Mixed hyperlipidemia  E78.2 metoprolol  succinate (TOPROL  XL) 25 MG 24 hr tablet    3. Benign essential hypertension  I10 metoprolol  succinate (TOPROL  XL) 25 MG 24 hr tablet    4. CVA, old, hemiparesis (HCC)  I69.359 metoprolol  succinate (TOPROL  XL) 25 MG 24 hr tablet    5. Coronary artery disease of native artery of native heart with stable angina pectoris (HCC)  I25.118 metoprolol  succinate (TOPROL  XL) 25 MG 24 hr tablet   No chest pains.       Problem List Items Addressed This Visit       Cardiovascular and Mediastinum   Benign essential hypertension   Relevant Medications   metoprolol  succinate (TOPROL  XL) 25 MG 24 hr tablet   Coronary artery disease of native artery of native heart with stable angina pectoris (HCC)   Relevant Medications   metoprolol  succinate (TOPROL  XL) 25 MG 24 hr tablet     Nervous and Auditory   CVA, old, hemiparesis (HCC)   Relevant Medications   metoprolol  succinate (TOPROL  XL) 25 MG 24 hr tablet     Other   Mixed hyperlipidemia   Relevant Medications   metoprolol  succinate (TOPROL  XL) 25 MG 24 hr tablet   Other Visit Diagnoses       Sinus bradycardia    -  Primary   change metoprolol  from 50 to 25 mg daily.   Relevant Medications  metoprolol  succinate (TOPROL  XL) 25 MG 24 hr tablet          Disposition:   Return in about 3 months (around 09/18/2024).    Total time spent: 35 minutes  Signed,  Denyse Bathe, MD  06/18/2024 9:18 AM    [facility]

## 2024-06-21 ENCOUNTER — Ambulatory Visit: Admitting: Cardiovascular Disease

## 2024-06-27 NOTE — Progress Notes (Signed)
   06/27/2024  Patient ID: Kayla Malone, female   DOB: [birth year], 63 y.o.   MRN: [id number]  Pharmacy Quality Measure Review  This patient is appearing on a report for being at risk of failing the adherence measure for diabetes medications this calendar year.   Medication: Rybelsus Last fill date: 01/03/24 for 90 day supply  Medication has been stopped. No further action needed at this time.  Jon VEAR Lindau, PharmD Clinical Pharmacist [phone]

## 2024-06-28 ENCOUNTER — Other Ambulatory Visit: Payer: Self-pay | Admitting: Internal Medicine

## 2024-07-07 ENCOUNTER — Encounter: Payer: Self-pay | Admitting: Family

## 2024-07-07 NOTE — Progress Notes (Signed)
 Established Patient Office Visit  Subjective:  Patient ID: Kayla Malone, female    DOB: 03/03/1961  Age: 63 y.o. MRN: [id number]  Chief Complaint  Patient presents with   Follow-up    3 month follow up    Patient is here today for her 3 months follow up.  She has been feeling fairly well since last appointment.   She does not have additional concerns to discuss today.  Labs are due today.  She needs refills.   I have reviewed her active problem list, medication list, allergies, notes from last encounter, lab results for her appointment today.      No other concerns at this time.   Past Medical History:  Diagnosis Date   Adenomatous colon polyp 02/20/2014   Asthma    Benign neoplasm of colon 02/20/2014   Chest pain 09/13/2013   Coronary angioplasty status 11/13/2018   Coronary artery disease    Depression    Diabetes mellitus without complication (HCC)    GERD (gastroesophageal reflux disease)    History of hiatal hernia    Hypertension    Hypokalemia 05/29/2019   Hypomagnesemia 07/02/2022   Nausea with vomiting, unspecified 06/07/2019   NSTEMI (non-ST elevated myocardial infarction) (HCC) 09/23/2018   Pain    BACK   Stroke (HCC)    x3   Thyroid  cyst    TIA (transient ischemic attack) 08/06/2017   Unstable angina (HCC) 10/20/2017   Weight loss 06/07/2019    Past Surgical History:  Procedure Laterality Date   ABDOMINAL HYSTERECTOMY     CARDIAC SURGERY     CATARACT EXTRACTION W/PHACO Left 08/15/2017   Procedure: CATARACT EXTRACTION PHACO AND INTRAOCULAR LENS PLACEMENT (IOC);  Surgeon: Jaye Fallow, MD;  Location: [facility] ORS;  Service: Ophthalmology;  Laterality: Left;  US  00:40.6 AP% 7.3 CDE 2.98 FLUID PACK LOT # [id number] h   CORONARY ARTERY BYPASS GRAFT N/A 10/23/2017   Procedure: CORONARY ARTERY BYPASS GRAFTING (CABG) x two  , using left internal mammary artery and right leg greater saphenous vein harvested  endoscopically - LIMA to LAD, SVG to  RCA;  Surgeon: Army Dallas NOVAK, MD;  Location: [facility] OR;  Service: Open Heart Surgery;  Laterality: N/A;   CORONARY STENT INTERVENTION N/A 09/24/2018   Procedure: CORONARY STENT INTERVENTION;  Surgeon: Dann Candyce RAMAN, MD;  Location: [facility] INVASIVE CV LAB;  Service: Cardiovascular;  Laterality: N/A;   ESOPHAGOGASTRODUODENOSCOPY N/A 05/30/2019   Procedure: ESOPHAGOGASTRODUODENOSCOPY (EGD);  Surgeon: Toledo, Ladell POUR, MD;  Location: [facility] ENDOSCOPY;  Service: Gastroenterology;  Laterality: N/A;   EYE SURGERY     LEFT HEART CATH AND CORONARY ANGIOGRAPHY Left 10/09/2017   Procedure: LEFT HEART CATH AND CORONARY ANGIOGRAPHY;  Surgeon: Fernand Denyse LABOR, MD;  Location: [facility] INVASIVE CV LAB;  Service: Cardiovascular;  Laterality: Left;   LEFT HEART CATH AND CORS/GRAFTS ANGIOGRAPHY N/A 09/24/2018   Procedure: LEFT HEART CATH AND CORS/GRAFTS ANGIOGRAPHY;  Surgeon: Dann Candyce RAMAN, MD;  Location: [facility] INVASIVE CV LAB;  Service: Cardiovascular;  Laterality: N/A;   LEFT HEART CATH AND CORS/GRAFTS ANGIOGRAPHY N/A 07/04/2022   Procedure: LEFT HEART CATH AND CORS/GRAFTS ANGIOGRAPHY;  Surgeon: Fernand Denyse LABOR, MD;  Location: [facility] INVASIVE CV LAB;  Service: Cardiovascular;  Laterality: N/A;   TEE WITHOUT CARDIOVERSION N/A 10/23/2017   Procedure: TRANSESOPHAGEAL ECHOCARDIOGRAM (TEE);  Surgeon: Army Dallas NOVAK, MD;  Location: [facility] OR;  Service: Open Heart Surgery;  Laterality: N/A;    Social History   Socioeconomic History   Marital status: Married  Spouse name: Not on file   Number of children: Not on file   Years of education: Not on file   Highest education level: Not on file  Occupational History   Not on file  Tobacco Use   Smoking status: Former    Current packs/day: 0.00    Types: Cigarettes    Quit date: 09/13/1998    Years since quitting: 25.8   Smokeless tobacco: Never  Vaping Use   Vaping status: Never Used  Substance and Sexual Activity   Alcohol use: No   Drug use: No   Sexual  activity: Yes    Birth control/protection: Post-menopausal  Other Topics Concern   Not on file  Social History Narrative   Not on file   Social Drivers of Health   Financial Resource Strain: Low Risk  (12/10/2023)   Overall Financial Resource Strain (CARDIA)    Difficulty of Paying Living Expenses: Not hard at all  Food Insecurity: No Food Insecurity (11/30/2023)   Hunger Vital Sign    Worried About Running Out of Food in the Last Year: Never true    Ran Out of Food in the Last Year: Never true  Transportation Needs: No Transportation Needs (12/10/2023)   PRAPARE - Administrator, Civil Service (Medical): No    Lack of Transportation (Non-Medical): No  Physical Activity: Insufficiently Active (11/30/2023)   Exercise Vital Sign    Days of Exercise per Week: 5 days    Minutes of Exercise per Session: 10 min  Stress: No Stress Concern Present (11/30/2023)   [employer] of Occupational Health - Occupational Stress Questionnaire    Feeling of Stress : Not at all  Social Connections: Moderately Isolated (12/10/2023)   Social Connection and Isolation Panel    Frequency of Communication with Friends and Family: More than three times a week    Frequency of Social Gatherings with Friends and Family: More than three times a week    Attends Religious Services: More than 4 times per year    Active Member of [employer] or Organizations: No    Attends Banker Meetings: Never    Marital Status: Widowed  Intimate Partner Violence: Not At Risk (11/30/2023)   Humiliation, Afraid, Rape, and Kick questionnaire    Fear of Current or Ex-Partner: No    Emotionally Abused: No    Physically Abused: No    Sexually Abused: No    Family History  Problem Relation Age of Onset   Heart failure Mother    Pulmonary embolism Mother    Cirrhosis Father    Coronary artery disease Father    Diabetes type II Father    Hypertension Father    Kidney disease Father    Coronary  artery disease Sister    Stroke Sister    COPD Brother    Coronary artery disease Brother    Breast cancer Neg Hx     Allergies  Allergen Reactions   Lisinopril Swelling and Shortness Of Breath   Meloxicam  Nausea Only and Nausea And Vomiting   Tramadol Nausea Only and Other (See Comments)    sleepiness  Other reaction(s): Dizziness, Other, Other (See Comments)  somnolence  Other reaction(s): Other (See Comments), Other (See Comments)  Would not wake up after taking this medication   Amoxicillin Rash and Dermatitis    Don't remember   Don't remember   Amoxicillin-Pot Clavulanate Hives, Rash and Dermatitis   Clavulanic Acid Rash    Review of Systems  All other systems reviewed and are negative.      Objective:   BP (!) 140/94   Pulse 66   Ht 5' 3 (1.6 m)   Wt 120 lb (54.4 kg)   SpO2 99%   BMI 21.26 kg/m   Vitals:   02/28/24 0953  BP: (!) 140/94  Pulse: 66  Height: 5' 3 (1.6 m)  Weight: 120 lb (54.4 kg)  SpO2: 99%  BMI (Calculated): 21.26    Physical Exam Vitals and nursing note reviewed.  Constitutional:      Appearance: Normal appearance. She is normal weight.  HENT:     Head: Normocephalic.  Eyes:     Extraocular Movements: Extraocular movements intact.     Conjunctiva/sclera: Conjunctivae normal.     Pupils: Pupils are equal, round, and reactive to light.  Cardiovascular:     Rate and Rhythm: Normal rate.  Pulmonary:     Effort: Pulmonary effort is normal.  Neurological:     General: No focal deficit present.     Mental Status: She is alert and oriented to person, place, and time. Mental status is at baseline.  Psychiatric:        Mood and Affect: Mood normal.        Behavior: Behavior normal.        Thought Content: Thought content normal.        Judgment: Judgment normal.      Results for orders placed or performed in visit on 02/28/24  Lipid panel  Result Value Ref Range   Cholesterol, Total 183 100 - 199 mg/dL   Triglycerides  88 0 - 149 mg/dL   HDL 51 >60 mg/dL   VLDL Cholesterol Cal 16 5 - 40 mg/dL   LDL Chol Calc (NIH) 883 (H) 0 - 99 mg/dL   Chol/HDL Ratio 3.6 0.0 - 4.4 ratio  VITAMIN D  25 Hydroxy (Vit-D Deficiency, Fractures)  Result Value Ref Range   Vit D, 25-Hydroxy 41.5 30.0 - 100.0 ng/mL  CMP14+EGFR  Result Value Ref Range   Glucose 103 (H) 70 - 99 mg/dL   BUN 16 8 - 27 mg/dL   Creatinine, Ser 8.69 (H) 0.57 - 1.00 mg/dL   eGFR 46 (L) >40 fO/fpw/8.26   BUN/Creatinine Ratio 12 12 - 28   Sodium 137 134 - 144 mmol/L   Potassium 3.5 3.5 - 5.2 mmol/L   Chloride 95 (L) 96 - 106 mmol/L   CO2 27 20 - 29 mmol/L   Calcium  9.8 8.7 - 10.3 mg/dL   Total Protein 7.8 6.0 - 8.5 g/dL   Albumin  4.8 3.9 - 4.9 g/dL   Globulin, Total 3.0 1.5 - 4.5 g/dL   Bilirubin Total 0.9 0.0 - 1.2 mg/dL   Alkaline Phosphatase 103 44 - 121 IU/L   AST 14 0 - 40 IU/L   ALT 13 0 - 32 IU/L  TSH  Result Value Ref Range   TSH 1.030 0.450 - 4.500 uIU/mL  Hemoglobin A1c  Result Value Ref Range   Hgb A1c MFr Bld 6.6 (H) 4.8 - 5.6 %   Est. average glucose Bld gHb Est-mCnc 143 mg/dL  Vitamin B12  Result Value Ref Range   Vitamin B-12 >2000 (H) 232 - 1245 pg/mL  CBC with Diff  Result Value Ref Range   WBC 8.4 3.4 - 10.8 x10E3/uL   RBC 5.49 (H) 3.77 - 5.28 x10E6/uL   Hemoglobin 15.7 11.1 - 15.9 g/dL   Hematocrit 51.7 (H) 65.9 - 46.6 %  MCV 88 79 - 97 fL   MCH 28.6 26.6 - 33.0 pg   MCHC 32.6 31.5 - 35.7 g/dL   RDW 87.0 88.2 - 84.5 %   Platelets 351 150 - 450 x10E3/uL   Neutrophils 51 Not Estab. %   Lymphs 42 Not Estab. %   Monocytes 6 Not Estab. %   Eos 1 Not Estab. %   Basos 0 Not Estab. %   Neutrophils Absolute 4.3 1.4 - 7.0 x10E3/uL   Lymphocytes Absolute 3.5 (H) 0.7 - 3.1 x10E3/uL   Monocytes Absolute 0.5 0.1 - 0.9 x10E3/uL   EOS (ABSOLUTE) 0.1 0.0 - 0.4 x10E3/uL   Basophils Absolute 0.0 0.0 - 0.2 x10E3/uL   Immature Granulocytes 0 Not Estab. %   Immature Grans (Abs) 0.0 0.0 - 0.1 x10E3/uL  Iron, TIBC and Ferritin  Panel  Result Value Ref Range   Total Iron Binding Capacity 386 250 - 450 ug/dL   UIBC 746 881 - 630 ug/dL   Iron 866 27 - 860 ug/dL   Iron Saturation 34 15 - 55 %   Ferritin 90 15 - 150 ng/mL  POC CREATINE & ALBUMIN ,URINE  Result Value Ref Range   Microalbumin Ur, POC 30 mg/L   Creatinine, POC 200 mg/dL   Albumin /Creatinine Ratio, Urine, POC <30   POCT Urinalysis Dipstick (81002)  Result Value Ref Range   Color, UA yellow    Clarity, UA clear    Glucose, UA Negative Negative   Bilirubin, UA Negative    Ketones, UA Negative    Spec Grav, UA 1.015 1.010 - 1.025   Blood, UA 2+    pH, UA 6.0 5.0 - 8.0   Protein, UA Negative Negative   Urobilinogen, UA 0.2 0.2 or 1.0 E.U./dL   Nitrite, UA Negative    Leukocytes, UA Trace (A) Negative   Appearance     Odor      Recent Results (from the past 2160 hours)  POCT CBG (Fasting - Glucose)     Status: Abnormal   Collection Time: 06/12/24  9:13 AM  Result Value Ref Range   Glucose Fasting, POC 122 (A) 70 - 99 mg/dL  [id number]     Status: Abnormal   Collection Time: 06/12/24  9:39 AM  Result Value Ref Range   Glucose 123 (H) 70 - 99 mg/dL   BUN 13 8 - 27 mg/dL   Creatinine, Ser 8.93 (H) 0.57 - 1.00 mg/dL   eGFR 59 (L) >40 fO/fpw/8.26   BUN/Creatinine Ratio 12 12 - 28   Sodium 140 134 - 144 mmol/L   Potassium 3.8 3.5 - 5.2 mmol/L   Chloride 100 96 - 106 mmol/L   CO2 26 20 - 29 mmol/L   Calcium  9.3 8.7 - 10.3 mg/dL   Total Protein 6.8 6.0 - 8.5 g/dL   Albumin  4.2 3.9 - 4.9 g/dL   Globulin, Total 2.6 1.5 - 4.5 g/dL   Bilirubin Total 0.5 0.0 - 1.2 mg/dL   Alkaline Phosphatase 80 44 - 121 IU/L   AST 12 0 - 40 IU/L   ALT 7 0 - 32 IU/L  Lipid panel     Status: None   Collection Time: 06/12/24  9:39 AM  Result Value Ref Range   Cholesterol, Total 155 100 - 199 mg/dL   Triglycerides 58 0 - 149 mg/dL   HDL 58 >60 mg/dL   VLDL Cholesterol Cal 12 5 - 40 mg/dL   LDL Chol Calc (NIH) 85 0 - 99 mg/dL  Chol/HDL Ratio 2.7 0.0 - 4.4  ratio    Comment:                                   T. Chol/HDL Ratio                                             Men  Women                               1/2 Avg.Risk  3.4    3.3                                   Avg.Risk  5.0    4.4                                2X Avg.Risk  9.6    7.1                                3X Avg.Risk 23.4   11.0   Iron, TIBC and Ferritin Panel     Status: None   Collection Time: 06/12/24  9:39 AM  Result Value Ref Range   Total Iron Binding Capacity 318 250 - 450 ug/dL   UIBC 752 881 - 630 ug/dL   Iron 71 27 - 860 ug/dL   Iron Saturation 22 15 - 55 %   Ferritin 40 15 - 150 ng/mL  VITAMIN D  25 Hydroxy (Vit-D Deficiency, Fractures)     Status: None   Collection Time: 06/12/24  9:39 AM  Result Value Ref Range   Vit D, 25-Hydroxy 35.5 30.0 - 100.0 ng/mL    Comment: Vitamin D  deficiency has been defined by the Institute of Medicine and an Endocrine Society practice guideline as a level of serum 25-OH vitamin D  less than 20 ng/mL (1,2). The Endocrine Society went on to further define vitamin D  insufficiency as a level between 21 and 29 ng/mL (2). 1. IOM (Institute of Medicine). 2010. Dietary reference    intakes for calcium  and D. [place]: The    [employer]. 2. Holick MF, [place], Bischoff-Ferrari HA, et al.    Evaluation, treatment, and prevention of vitamin D     deficiency: an Endocrine Society clinical practice    guideline. JCEM. 2011 Jul; 96(7):1911-30.   Vitamin B12     Status: Abnormal   Collection Time: 06/12/24  9:39 AM  Result Value Ref Range   Vitamin B-12 >2000 (H) 232 - 1245 pg/mL  CBC with Diff     Status: None   Collection Time: 06/12/24  9:39 AM  Result Value Ref Range   WBC 4.0 3.4 - 10.8 x10E3/uL   RBC 4.24 3.77 - 5.28 x10E6/uL   Hemoglobin 12.1 11.1 - 15.9 g/dL   Hematocrit 63.0 65.9 - 46.6 %   MCV 87 79 - 97 fL   MCH 28.5 26.6 - 33.0 pg   MCHC 32.8 31.5 - 35.7 g/dL   RDW 87.7 88.2 - 84.5 %   Platelets  337 150 -  450 x10E3/uL   Neutrophils 44 Not Estab. %   Lymphs 45 Not Estab. %   Monocytes 8 Not Estab. %   Eos 2 Not Estab. %   Basos 1 Not Estab. %   Neutrophils Absolute 1.8 1.4 - 7.0 x10E3/uL   Lymphocytes Absolute 1.8 0.7 - 3.1 x10E3/uL   Monocytes Absolute 0.3 0.1 - 0.9 x10E3/uL   EOS (ABSOLUTE) 0.1 0.0 - 0.4 x10E3/uL   Basophils Absolute 0.1 0.0 - 0.2 x10E3/uL   Immature Granulocytes 0 Not Estab. %   Immature Grans (Abs) 0.0 0.0 - 0.1 x10E3/uL  Hemoglobin A1c     Status: Abnormal   Collection Time: 06/12/24  9:39 AM  Result Value Ref Range   Hgb A1c MFr Bld 6.5 (H) 4.8 - 5.6 %    Comment:          Prediabetes: 5.7 - 6.4          Diabetes: >6.4          Glycemic control for adults with diabetes: <7.0    Est. average glucose Bld gHb Est-mCnc 140 mg/dL  TSH     Status: None   Collection Time: 06/12/24  9:39 AM  Result Value Ref Range   TSH 1.930 0.450 - 4.500 uIU/mL  POC CREATINE & ALBUMIN ,URINE     Status: Normal   Collection Time: 06/12/24 10:17 AM  Result Value Ref Range   Microalbumin Ur, POC 10 mg/L   Creatinine, POC 50 mg/dL   Albumin /Creatinine Ratio, Urine, POC <30        Assessment & Plan Type 2 diabetes mellitus with hyperglycemia, without long-term current use of insulin  (HCC) Checking labs today. Will call pt. With results  Continue current diabetes POC, as patient has been well controlled on current regimen.  Will adjust meds if needed based on labs.   -CBC w/Diff -CMP w/eGFR -Hemoglobin A1C  Benign essential hypertension Blood pressure well controlled with current medications.  Continue current therapy.  Will reassess at follow up.   - CBC w/Diff - CMP w/eGFR  Mixed hyperlipidemia Checking labs today.  Continue current therapy for lipid control. Will modify as needed based on labwork results.   -CMP w/eGFR -Lipid Panel  Vitamin D  deficiency, unspecified B12 deficiency due to diet Dehydration Other fatigue Nontoxic uninodular  goiter Checking labs today.  Will continue supplements as needed.   - Vitamin D  - Vitamin B12 - TSH  Coronary graft stenosis, subsequent encounter Patient is seen by cardiology, who manage this condition.  She is well controlled with current therapy.   Will defer to them for further changes to plan of care.     Return in about 3 months (around 05/30/2024).   Total time spent: 30 minutes  ALAN CHRISTELLA ARRANT, FNP  02/28/2024   This document may have been prepared by [facility] Voice Recognition software and as such may include unintentional dictation errors.

## 2024-07-07 NOTE — Assessment & Plan Note (Signed)
 Checking labs today.  Will continue supplements as needed.   - Vitamin D  - Vitamin B12 - TSH

## 2024-07-07 NOTE — Assessment & Plan Note (Signed)
Patient is seen by cardiology, who manage this condition.  She is well controlled with current therapy.   Will defer to them for further changes to plan of care.

## 2024-07-07 NOTE — Assessment & Plan Note (Signed)
 Blood pressure well controlled with current medications.  Continue current therapy.  Will reassess at follow up.   - CBC w/Diff - CMP w/eGFR

## 2024-07-07 NOTE — Assessment & Plan Note (Signed)
 Checking labs today. Will call pt. With results  Continue current diabetes POC, as patient has been well controlled on current regimen.  Will adjust meds if needed based on labs.   -CBC w/Diff -CMP w/eGFR -Hemoglobin A1C

## 2024-07-07 NOTE — Assessment & Plan Note (Signed)
 Checking labs today.  Continue current therapy for lipid control. Will modify as needed based on labwork results.   -CMP w/eGFR -Lipid Panel

## 2024-07-16 DIAGNOSIS — M50323 Other cervical disc degeneration at C6-C7 level: Secondary | ICD-10-CM | POA: Diagnosis not present

## 2024-07-16 DIAGNOSIS — M542 Cervicalgia: Secondary | ICD-10-CM | POA: Diagnosis not present

## 2024-07-16 DIAGNOSIS — M25649 Stiffness of unspecified hand, not elsewhere classified: Secondary | ICD-10-CM | POA: Diagnosis not present

## 2024-07-16 DIAGNOSIS — R202 Paresthesia of skin: Secondary | ICD-10-CM | POA: Diagnosis not present

## 2024-07-16 DIAGNOSIS — S098XXS Other specified injuries of head, sequela: Secondary | ICD-10-CM | POA: Diagnosis not present

## 2024-07-16 DIAGNOSIS — M50322 Other cervical disc degeneration at C5-C6 level: Secondary | ICD-10-CM | POA: Diagnosis not present

## 2024-07-16 DIAGNOSIS — E119 Type 2 diabetes mellitus without complications: Secondary | ICD-10-CM | POA: Diagnosis not present

## 2024-07-17 DIAGNOSIS — M542 Cervicalgia: Secondary | ICD-10-CM | POA: Diagnosis not present

## 2024-07-17 DIAGNOSIS — M25511 Pain in right shoulder: Secondary | ICD-10-CM | POA: Diagnosis not present

## 2024-07-19 DIAGNOSIS — M542 Cervicalgia: Secondary | ICD-10-CM | POA: Diagnosis not present

## 2024-07-22 DIAGNOSIS — M542 Cervicalgia: Secondary | ICD-10-CM | POA: Diagnosis not present

## 2024-07-22 DIAGNOSIS — M25511 Pain in right shoulder: Secondary | ICD-10-CM | POA: Diagnosis not present

## 2024-07-29 DIAGNOSIS — M542 Cervicalgia: Secondary | ICD-10-CM | POA: Diagnosis not present

## 2024-07-29 DIAGNOSIS — M25511 Pain in right shoulder: Secondary | ICD-10-CM | POA: Diagnosis not present

## 2024-07-31 DIAGNOSIS — Z20822 Contact with and (suspected) exposure to covid-19: Secondary | ICD-10-CM | POA: Diagnosis not present

## 2024-08-07 ENCOUNTER — Other Ambulatory Visit: Payer: Self-pay

## 2024-08-07 ENCOUNTER — Emergency Department
Admission: EM | Admit: 2024-08-07 | Discharge: 2024-08-07 | Disposition: A | Discharge status: Home / Self Care | Attending: Emergency Medicine | Admitting: Emergency Medicine

## 2024-08-07 ENCOUNTER — Emergency Department

## 2024-08-07 DIAGNOSIS — R1031 Right lower quadrant pain: Secondary | ICD-10-CM | POA: Insufficient documentation

## 2024-08-07 DIAGNOSIS — Z85038 Personal history of other malignant neoplasm of large intestine: Secondary | ICD-10-CM | POA: Diagnosis not present

## 2024-08-07 DIAGNOSIS — E876 Hypokalemia: Secondary | ICD-10-CM | POA: Insufficient documentation

## 2024-08-07 DIAGNOSIS — I2510 Atherosclerotic heart disease of native coronary artery without angina pectoris: Secondary | ICD-10-CM | POA: Diagnosis not present

## 2024-08-07 DIAGNOSIS — I10 Essential (primary) hypertension: Secondary | ICD-10-CM | POA: Diagnosis not present

## 2024-08-07 DIAGNOSIS — E119 Type 2 diabetes mellitus without complications: Secondary | ICD-10-CM | POA: Insufficient documentation

## 2024-08-07 DIAGNOSIS — Z951 Presence of aortocoronary bypass graft: Secondary | ICD-10-CM | POA: Insufficient documentation

## 2024-08-07 DIAGNOSIS — K760 Fatty (change of) liver, not elsewhere classified: Secondary | ICD-10-CM | POA: Diagnosis not present

## 2024-08-07 DIAGNOSIS — J45909 Unspecified asthma, uncomplicated: Secondary | ICD-10-CM | POA: Insufficient documentation

## 2024-08-07 DIAGNOSIS — R1030 Lower abdominal pain, unspecified: Secondary | ICD-10-CM | POA: Diagnosis present

## 2024-08-07 LAB — URINALYSIS, ROUTINE W REFLEX MICROSCOPIC
Bacteria, UA: NONE SEEN
Bilirubin Urine: NEGATIVE
Glucose, UA: 50 mg/dL — AB
Ketones, ur: NEGATIVE mg/dL
Nitrite: NEGATIVE
Protein, ur: NEGATIVE mg/dL
Specific Gravity, Urine: 1.014 (ref 1.005–1.030)
pH: 5 (ref 5.0–8.0)

## 2024-08-07 LAB — COMPREHENSIVE METABOLIC PANEL WITH GFR
ALT: 10 U/L (ref 0–44)
AST: 16 U/L (ref 15–41)
Albumin: 3.6 g/dL (ref 3.5–5.0)
Alkaline Phosphatase: 71 U/L (ref 38–126)
Anion gap: 10 (ref 5–15)
BUN: 15 mg/dL (ref 8–23)
CO2: 25 mmol/L (ref 22–32)
Calcium: 8.4 mg/dL — ABNORMAL LOW (ref 8.9–10.3)
Chloride: 101 mmol/L (ref 98–111)
Creatinine, Ser: 1.1 mg/dL — ABNORMAL HIGH (ref 0.44–1.00)
GFR, Estimated: 56 mL/min — ABNORMAL LOW (ref >60)
Glucose, Bld: 220 mg/dL — ABNORMAL HIGH (ref 70–99)
Potassium: 2.7 mmol/L — CL (ref 3.5–5.1)
Sodium: 136 mmol/L (ref 135–145)
Total Bilirubin: 0.7 mg/dL (ref 0.0–1.2)
Total Protein: 6.6 g/dL (ref 6.5–8.1)

## 2024-08-07 LAB — CBC
HCT: 38.2 % (ref 36.0–46.0)
Hemoglobin: 12.8 g/dL (ref 12.0–15.0)
MCH: 28.2 pg (ref 26.0–34.0)
MCHC: 33.5 g/dL (ref 30.0–36.0)
MCV: 84.1 fL (ref 80.0–100.0)
Platelets: 295 K/uL (ref 150–400)
RBC: 4.54 MIL/uL (ref 3.87–5.11)
RDW: 12.6 % (ref 11.5–15.5)
WBC: 7.5 K/uL (ref 4.0–10.5)
nRBC: 0 % (ref 0.0–0.2)

## 2024-08-07 LAB — LIPASE, BLOOD: Lipase: 51 U/L (ref 11–51)

## 2024-08-07 MED ORDER — KETOROLAC TROMETHAMINE 15 MG/ML IJ SOLN
15.0000 mg | Freq: Once | INTRAMUSCULAR | Status: AC
  Administered 2024-08-07: 15 mg via INTRAVENOUS
  Filled 2024-08-07: qty 1

## 2024-08-07 MED ORDER — POTASSIUM CHLORIDE CRYS ER 20 MEQ PO TBCR
60.0000 meq | EXTENDED_RELEASE_TABLET | Freq: Once | ORAL | Status: AC
  Administered 2024-08-07: 60 meq via ORAL
  Filled 2024-08-07: qty 3

## 2024-08-07 MED ORDER — IOHEXOL 300 MG/ML  SOLN
100.0000 mL | Freq: Once | INTRAMUSCULAR | Status: AC | PRN
  Administered 2024-08-07: 100 mL via INTRAVENOUS

## 2024-08-07 NOTE — ED Provider Notes (Signed)
 [facility] Provider Note    Event Date/Time   First MD Initiated Contact with Patient 08/07/24 1902     (approximate)   History   Chief Complaint: Abdominal Pain   HPI  Kayla Malone is a 63 y.o. female with a history of GERD diabetes hypertension who comes ED complaining of lower abdominal pain that started last night, constant, radiating around to the lower back.  Worse with movement.  No dysuria vomiting or diarrhea.  Normal oral intake.  Took Tylenol  without relief.        Past Medical History:  Diagnosis Date   Adenomatous colon polyp 02/20/2014   Asthma    Benign neoplasm of colon 02/20/2014   Chest pain 09/13/2013   Coronary angioplasty status 11/13/2018   Coronary artery disease    Depression    Diabetes mellitus without complication (HCC)    GERD (gastroesophageal reflux disease)    History of hiatal hernia    Hypertension    Hypokalemia 05/29/2019   Hypomagnesemia 07/02/2022   Nausea with vomiting, unspecified 06/07/2019   NSTEMI (non-ST elevated myocardial infarction) (HCC) 09/23/2018   Pain    BACK   Stroke (HCC)    x3   Thyroid  cyst    TIA (transient ischemic attack) 08/06/2017   Unstable angina (HCC) 10/20/2017   Weight loss 06/07/2019    Current Outpatient Rx   Order #: [id number] Class: Historical Med   Order #: [id number] Class: Normal   Order #: [id number] Class: Historical Med   Order #: [id number] Class: Historical Med   Order #: [id number] Class: Normal   Order #: [id number] Class: Historical Med   Order #: [id number] Class: Normal   Order #: [id number] Class: Normal   Order #: [id number] Class: Normal   Order #: [id number] Class: Historical Med   Order #: [id number] Class: Normal   Order #: [id number] Class: Normal   Order #: [id number] Class: Historical Med   Order #: [id number] Class: Normal   Order #: [id number] Class: Normal   Order #: [id number] Class: Normal   Order #: [id number] Class: Normal   Order #: [id number] Class:  Normal   Order #: [id number] Class: Normal   Order #: [id number] Class: Normal   Order #: [id number] Class: Normal    Past Surgical History:  Procedure Laterality Date   ABDOMINAL HYSTERECTOMY     CARDIAC SURGERY     CATARACT EXTRACTION W/PHACO Left 08/15/2017   Procedure: CATARACT EXTRACTION PHACO AND INTRAOCULAR LENS PLACEMENT (IOC);  Surgeon: Jaye Fallow, MD;  Location: [facility] ORS;  Service: Ophthalmology;  Laterality: Left;  US  00:40.6 AP% 7.3 CDE 2.98 FLUID PACK LOT # [id number] h   CORONARY ARTERY BYPASS GRAFT N/A 10/23/2017   Procedure: CORONARY ARTERY BYPASS GRAFTING (CABG) x two  , using left internal mammary artery and right leg greater saphenous vein harvested  endoscopically - LIMA to LAD, SVG to RCA;  Surgeon: Army Dallas NOVAK, MD;  Location: [facility] OR;  Service: Open Heart Surgery;  Laterality: N/A;   CORONARY STENT INTERVENTION N/A 09/24/2018   Procedure: CORONARY STENT INTERVENTION;  Surgeon: Dann Candyce RAMAN, MD;  Location: [facility] INVASIVE CV LAB;  Service: Cardiovascular;  Laterality: N/A;   ESOPHAGOGASTRODUODENOSCOPY N/A 05/30/2019   Procedure: ESOPHAGOGASTRODUODENOSCOPY (EGD);  Surgeon: Toledo, Ladell POUR, MD;  Location: [facility] ENDOSCOPY;  Service: Gastroenterology;  Laterality: N/A;   EYE SURGERY     LEFT HEART CATH AND CORONARY ANGIOGRAPHY Left 10/09/2017   Procedure: LEFT HEART CATH AND CORONARY ANGIOGRAPHY;  Surgeon: Fernand Denyse LABOR, MD;  Location: [facility] INVASIVE CV LAB;  Service: Cardiovascular;  Laterality: Left;   LEFT  HEART CATH AND CORS/GRAFTS ANGIOGRAPHY N/A 09/24/2018   Procedure: LEFT HEART CATH AND CORS/GRAFTS ANGIOGRAPHY;  Surgeon: Dann Candyce RAMAN, MD;  Location: [facility] INVASIVE CV LAB;  Service: Cardiovascular;  Laterality: N/A;   LEFT HEART CATH AND CORS/GRAFTS ANGIOGRAPHY N/A 07/04/2022   Procedure: LEFT HEART CATH AND CORS/GRAFTS ANGIOGRAPHY;  Surgeon: Fernand Denyse LABOR, MD;  Location: [facility] INVASIVE CV LAB;  Service: Cardiovascular;  Laterality: N/A;   TEE WITHOUT  CARDIOVERSION N/A 10/23/2017   Procedure: TRANSESOPHAGEAL ECHOCARDIOGRAM (TEE);  Surgeon: Army Dallas NOVAK, MD;  Location: [facility] OR;  Service: Open Heart Surgery;  Laterality: N/A;    Physical Exam   Triage Vital Signs: ED Triage Vitals  Encounter Vitals Group     BP 08/07/24 1851 108/61     Girls Systolic BP Percentile --      Girls Diastolic BP Percentile --      Boys Systolic BP Percentile --      Boys Diastolic BP Percentile --      Pulse Rate 08/07/24 1851 66     Resp 08/07/24 1851 18     Temp 08/07/24 1851 98.2 F (36.8 C)     Temp Source 08/07/24 1851 Oral     SpO2 08/07/24 1851 99 %     Weight 08/07/24 1849 129 lb (58.5 kg)     Height 08/07/24 1849 5' 3 (1.6 m)     Head Circumference --      Peak Flow --      Pain Score 08/07/24 1850 9     Pain Loc --      Pain Education --      Exclude from Growth Chart --     Most recent vital signs: Vitals:   08/07/24 1851  BP: 108/61  Pulse: 66  Resp: 18  Temp: 98.2 F (36.8 C)  SpO2: 99%    General: Awake, no distress.  CV:  Good peripheral perfusion.  Regular rate rhythm Resp:  Normal effort.  Abd:  No distention.  Soft with right lower quadrant tenderness Other:     ED Results / Procedures / Treatments   Labs (all labs ordered are listed, but only abnormal results are displayed) Labs Reviewed  COMPREHENSIVE METABOLIC PANEL WITH GFR - Abnormal; Notable for the following components:      Result Value   Potassium 2.7 (*)    Glucose, Bld 220 (*)    Creatinine, Ser 1.10 (*)    Calcium  8.4 (*)    GFR, Estimated 56 (*)    All other components within normal limits  URINALYSIS, ROUTINE W REFLEX MICROSCOPIC - Abnormal; Notable for the following components:   Color, Urine YELLOW (*)    APPearance HAZY (*)    Glucose, UA 50 (*)    Hgb urine dipstick SMALL (*)    Leukocytes,Ua TRACE (*)    All other components within normal limits  LIPASE, BLOOD  CBC     EKG    RADIOLOGY CT abdomen pelvis interpreted by  me, no evidence of bowel obstruction or perforation.  Radiology report reviewed, normal appendix   PROCEDURES:  Procedures   MEDICATIONS ORDERED IN ED: Medications  ketorolac  (TORADOL ) 15 MG/ML injection 15 mg (15 mg Intravenous Given 08/07/24 1934)  iohexol  (OMNIPAQUE ) 300 MG/ML solution 100 mL (100 mLs Intravenous Contrast Given 08/07/24 2019)  potassium chloride  SA (KLOR-CON  M) CR tablet 60 mEq (60 mEq Oral Given 08/07/24 2113)     IMPRESSION / MDM / ASSESSMENT AND PLAN / ED COURSE  I  reviewed the triage vital signs and the nursing notes.  DDx: UTI, ovarian cyst, appendicitis, tumor  Patient's presentation is most consistent with acute presentation with potential threat to life or bodily function.  Patient presents with lower abdominal pain, RLQ tenderness.  Will obtain CT and labs.    ----------------------------------------- 9:27 PM on 08/07/2024 ----------------------------------------- Symptoms resolved.  Tolerating oral intake.  She will continue her home potassium supplement, follow-up with her doctor.  She is scheduled for outpatient labs next week.  Other than hypokalemia, CT and labs today are reassuring.  Stable for discharge     FINAL CLINICAL IMPRESSION(S) / ED DIAGNOSES   Final diagnoses:  RLQ abdominal pain  Hypokalemia     Rx / DC Orders   ED Discharge Orders     None        Note:  This document was prepared using Dragon voice recognition software and may include unintentional dictation errors.   Viviann Pastor, MD 08/07/24 2128

## 2024-08-07 NOTE — ED Notes (Signed)
Pt provided w/ crackers and ginger ale for PO challenge.

## 2024-08-07 NOTE — ED Notes (Signed)
 Passed po challenge

## 2024-08-07 NOTE — ED Triage Notes (Signed)
 Patient reports lower abdominal pain since last night; denies N/V/D and urinary symptoms.

## 2024-08-08 ENCOUNTER — Encounter: Payer: Self-pay | Admitting: Cardiology

## 2024-08-08 ENCOUNTER — Ambulatory Visit (INDEPENDENT_AMBULATORY_CARE_PROVIDER_SITE_OTHER): Admitting: Cardiology

## 2024-08-08 VITALS — BP 122/72 | HR 67 | Ht 63.0 in | Wt 138.2 lb

## 2024-08-08 DIAGNOSIS — E876 Hypokalemia: Secondary | ICD-10-CM

## 2024-08-08 DIAGNOSIS — N3000 Acute cystitis without hematuria: Secondary | ICD-10-CM | POA: Insufficient documentation

## 2024-08-08 DIAGNOSIS — Z0130 Encounter for examination of blood pressure without abnormal findings: Secondary | ICD-10-CM

## 2024-08-08 MED ORDER — POTASSIUM CHLORIDE CRYS ER 20 MEQ PO TBCR: 40.0000 meq | EXTENDED_RELEASE_TABLET | Freq: Every day | ORAL | 0 refills | Status: DC

## 2024-08-08 MED ORDER — NITROFURANTOIN MONOHYD MACRO 100 MG PO CAPS: 100.0000 mg | ORAL_CAPSULE | Freq: Two times a day (BID) | ORAL | 0 refills | Status: AC

## 2024-08-08 NOTE — Progress Notes (Signed)
 Established Patient Office Visit  Subjective:  Patient ID: Kayla Malone, female    DOB: 07/03/1961  Age: 63 y.o. MRN: [id number]  Chief Complaint  Patient presents with   Follow-up    Follow up from ER, discuss labs     Patient in office for an acute visit, went to ED yesterday for abdominal pain. Patient found to have hypokalemia despite taking daily potassium supplement. Patient was instructed to take potassium 40 meq until seen today. Will continue 40 meq and recheck level on Tuesday.  UA in ED showed trace leukocytes, will send in Macrobid .     No other concerns at this time.   Past Medical History:  Diagnosis Date   Adenomatous colon polyp 02/20/2014   Asthma    Benign neoplasm of colon 02/20/2014   Chest pain 09/13/2013   Coronary angioplasty status 11/13/2018   Coronary artery disease    Depression    Diabetes mellitus without complication (HCC)    GERD (gastroesophageal reflux disease)    History of hiatal hernia    Hypertension    Hypokalemia 05/29/2019   Hypomagnesemia 07/02/2022   Nausea with vomiting, unspecified 06/07/2019   NSTEMI (non-ST elevated myocardial infarction) (HCC) 09/23/2018   Pain    BACK   Stroke (HCC)    x3   Thyroid  cyst    TIA (transient ischemic attack) 08/06/2017   Unstable angina (HCC) 10/20/2017   Weight loss 06/07/2019    Past Surgical History:  Procedure Laterality Date   ABDOMINAL HYSTERECTOMY     CARDIAC SURGERY     CATARACT EXTRACTION W/PHACO Left 08/15/2017   Procedure: CATARACT EXTRACTION PHACO AND INTRAOCULAR LENS PLACEMENT (IOC);  Surgeon: Jaye Fallow, MD;  Location: [facility] ORS;  Service: Ophthalmology;  Laterality: Left;  US  00:40.6 AP% 7.3 CDE 2.98 FLUID PACK LOT # [id number]   CORONARY ARTERY BYPASS GRAFT N/A 10/23/2017   Procedure: CORONARY ARTERY BYPASS GRAFTING (CABG) x two  , using left internal mammary artery and right leg greater saphenous vein harvested  endoscopically - LIMA to LAD, SVG to RCA;   Surgeon: Army Dallas NOVAK, MD;  Location: [facility] OR;  Service: Open Heart Surgery;  Laterality: N/A;   CORONARY STENT INTERVENTION N/A 09/24/2018   Procedure: CORONARY STENT INTERVENTION;  Surgeon: Dann Candyce RAMAN, MD;  Location: [facility] INVASIVE CV LAB;  Service: Cardiovascular;  Laterality: N/A;   ESOPHAGOGASTRODUODENOSCOPY N/A 05/30/2019   Procedure: ESOPHAGOGASTRODUODENOSCOPY (EGD);  Surgeon: Toledo, Ladell POUR, MD;  Location: [facility] ENDOSCOPY;  Service: Gastroenterology;  Laterality: N/A;   EYE SURGERY     LEFT HEART CATH AND CORONARY ANGIOGRAPHY Left 10/09/2017   Procedure: LEFT HEART CATH AND CORONARY ANGIOGRAPHY;  Surgeon: Fernand Denyse LABOR, MD;  Location: [facility] INVASIVE CV LAB;  Service: Cardiovascular;  Laterality: Left;   LEFT HEART CATH AND CORS/GRAFTS ANGIOGRAPHY N/A 09/24/2018   Procedure: LEFT HEART CATH AND CORS/GRAFTS ANGIOGRAPHY;  Surgeon: Dann Candyce RAMAN, MD;  Location: [facility] INVASIVE CV LAB;  Service: Cardiovascular;  Laterality: N/A;   LEFT HEART CATH AND CORS/GRAFTS ANGIOGRAPHY N/A 07/04/2022   Procedure: LEFT HEART CATH AND CORS/GRAFTS ANGIOGRAPHY;  Surgeon: Fernand Denyse LABOR, MD;  Location: [facility] INVASIVE CV LAB;  Service: Cardiovascular;  Laterality: N/A;   TEE WITHOUT CARDIOVERSION N/A 10/23/2017   Procedure: TRANSESOPHAGEAL ECHOCARDIOGRAM (TEE);  Surgeon: Army Dallas NOVAK, MD;  Location: [facility] OR;  Service: Open Heart Surgery;  Laterality: N/A;    Social History   Socioeconomic History   Marital status: Married    Spouse name: Not on  file   Number of children: Not on file   Years of education: Not on file   Highest education level: Not on file  Occupational History   Not on file  Tobacco Use   Smoking status: Former    Current packs/day: 0.00    Types: Cigarettes    Quit date: 09/13/1998    Years since quitting: 25.9   Smokeless tobacco: Never  Vaping Use   Vaping status: Never Used  Substance and Sexual Activity   Alcohol use: No   Drug use: No   Sexual activity:  Yes    Birth control/protection: Post-menopausal  Other Topics Concern   Not on file  Social History Narrative   Not on file   Social Drivers of Health   Financial Resource Strain: Low Risk  (12/10/2023)   Overall Financial Resource Strain (CARDIA)    Difficulty of Paying Living Expenses: Not hard at all  Food Insecurity: No Food Insecurity (11/30/2023)   Hunger Vital Sign    Worried About Running Out of Food in the Last Year: Never true    Ran Out of Food in the Last Year: Never true  Transportation Needs: No Transportation Needs (12/10/2023)   PRAPARE - Administrator, Civil Service (Medical): No    Lack of Transportation (Non-Medical): No  Physical Activity: Insufficiently Active (11/30/2023)   Exercise Vital Sign    Days of Exercise per Week: 5 days    Minutes of Exercise per Session: 10 min  Stress: No Stress Concern Present (11/30/2023)   [employer] of Occupational Health - Occupational Stress Questionnaire    Feeling of Stress : Not at all  Social Connections: Moderately Isolated (12/10/2023)   Social Connection and Isolation Panel    Frequency of Communication with Friends and Family: More than three times a week    Frequency of Social Gatherings with Friends and Family: More than three times a week    Attends Religious Services: More than 4 times per year    Active Member of [employer] or Organizations: No    Attends Banker Meetings: Never    Marital Status: Widowed  Intimate Partner Violence: Not At Risk (11/30/2023)   Humiliation, Afraid, Rape, and Kick questionnaire    Fear of Current or Ex-Partner: No    Emotionally Abused: No    Physically Abused: No    Sexually Abused: No    Family History  Problem Relation Age of Onset   Heart failure Mother    Pulmonary embolism Mother    Cirrhosis Father    Coronary artery disease Father    Diabetes type II Father    Hypertension Father    Kidney disease Father    Coronary artery disease  Sister    Stroke Sister    COPD Brother    Coronary artery disease Brother    Breast cancer Neg Hx     Allergies  Allergen Reactions   Lisinopril Swelling and Shortness Of Breath   Meloxicam  Nausea Only and Nausea And Vomiting   Tramadol Nausea Only and Other (See Comments)    sleepiness  Other reaction(s): Dizziness, Other, Other (See Comments)  somnolence  Other reaction(s): Other (See Comments), Other (See Comments)  Would not wake up after taking this medication   Amoxicillin Rash and Dermatitis    Don't remember   Don't remember   Amoxicillin-Pot Clavulanate Hives, Rash and Dermatitis   Clavulanic Acid Rash    Outpatient Medications Prior to Visit  Medication Sig  Accu-Chek Softclix Lancets lancets USE TO TEST SUGARS TWICE DAILY   acetaminophen  (TYLENOL ) 500 MG tablet Take 1,000 mg by mouth every 6 (six) hours as needed for mild pain or headache.   aspirin  EC 81 MG tablet Take 81 mg by mouth daily.   atorvastatin  (LIPITOR ) 80 MG tablet TAKE 1 TABLET EVERY DAY   Blood Glucose Monitoring Suppl (ACCU-CHEK GUIDE ME) w/Device KIT    chlorthalidone (HYGROTON) 25 MG tablet TAKE 1 TABLET EVERY DAY   clopidogrel  (PLAVIX ) 75 MG tablet TAKE 1 TABLET EVERY DAY   fluticasone  (FLONASE ) 50 MCG/ACT nasal spray USE 2 SPRAYS IN EACH NOSTRIL EVERY DAY   glucose blood (ACCU-CHEK GUIDE) test strip Blood Glucose Test In Vitro Strip QTY: 100 strip Days: 90 Refills: 3  Written: 06/07/19 Patient Instructions: E11.9 - to check blodd sugars once daily   isosorbide  mononitrate (IMDUR ) 30 MG 24 hr tablet TAKE 1 TABLET EVERY DAY   losartan  (COZAAR ) 25 MG tablet TAKE 1 TABLET EVERY DAY   methocarbamol  (ROBAXIN ) 500 MG tablet Take 500 mg by mouth at bedtime as needed for muscle spasms.   metoprolol  succinate (TOPROL  XL) 25 MG 24 hr tablet Take 1 tablet (25 mg total) by mouth daily.   nitroGLYCERIN  (NITROSTAT ) 0.4 MG SL tablet Place 1 tablet (0.4 mg total) under the tongue every 5 (five) minutes x 3  doses as needed for chest pain.   ondansetron  (ZOFRAN -ODT) 4 MG disintegrating tablet DISSOLVE 1 TABLET IN MOUTH UP TO EVERY 8 HOURS AS NEEDED FOR NAUSEA   pantoprazole  (PROTONIX ) 40 MG tablet TAKE 1 TABLET BY MOUTH EVERY MORNING 30 MINS BEFORE FOOD   ranolazine (RANEXA) 500 MG 12 hr tablet TAKE 1 TABLET TWICE DAILY   sucralfate  (CARAFATE ) 1 g tablet TAKE 1 TABLET BY MOUTH 4 TIMES DAILY.   tiZANidine (ZANAFLEX) 4 MG tablet Take 4 mg by mouth every 6 (six) hours.   [DISCONTINUED] potassium chloride  SA (KLOR-CON  M) 20 MEQ tablet TAKE 1 TABLET EVERY DAY   Zinc  Oxide 16 % OINT Apply 1 Application topically 4 (four) times daily as needed. (Patient not taking: Reported on 08/08/2024)   No facility-administered medications prior to visit.    Review of Systems  Constitutional: Negative.   HENT: Negative.    Eyes: Negative.   Respiratory: Negative.  Negative for shortness of breath.   Cardiovascular: Negative.  Negative for chest pain.  Gastrointestinal: Negative.  Negative for abdominal pain, constipation and diarrhea.  Genitourinary: Negative.   Musculoskeletal:  Negative for joint pain and myalgias.  Skin: Negative.   Neurological: Negative.  Negative for dizziness and headaches.  Endo/Heme/Allergies: Negative.   All other systems reviewed and are negative.      Objective:   BP 122/72   Pulse 67   Ht 5' 3 (1.6 m)   Wt 138 lb 3.2 oz (62.7 kg)   SpO2 98%   BMI 24.48 kg/m   Vitals:   08/08/24 1354  BP: 122/72  Pulse: 67  Height: 5' 3 (1.6 m)  Weight: 138 lb 3.2 oz (62.7 kg)  SpO2: 98%  BMI (Calculated): 24.49    Physical Exam Vitals and nursing note reviewed.  Constitutional:      Appearance: Normal appearance. She is normal weight.  HENT:     Head: Normocephalic and atraumatic.     Nose: Nose normal.     Mouth/Throat:     Mouth: Mucous membranes are moist.  Eyes:     Extraocular Movements: Extraocular movements intact.  Conjunctiva/sclera: Conjunctivae normal.      Pupils: Pupils are equal, round, and reactive to light.  Cardiovascular:     Rate and Rhythm: Normal rate and regular rhythm.     Pulses: Normal pulses.     Heart sounds: Normal heart sounds.  Pulmonary:     Effort: Pulmonary effort is normal.     Breath sounds: Normal breath sounds.  Abdominal:     General: Abdomen is flat. Bowel sounds are normal.     Palpations: Abdomen is soft.  Musculoskeletal:        General: Normal range of motion.     Cervical back: Normal range of motion.  Skin:    General: Skin is warm and dry.  Neurological:     General: No focal deficit present.     Mental Status: She is alert and oriented to person, place, and time.  Psychiatric:        Mood and Affect: Mood normal.        Behavior: Behavior normal.        Thought Content: Thought content normal.        Judgment: Judgment normal.      No results found for any visits on 08/08/24.  Recent Results (from the past 2160 hours)  POCT CBG (Fasting - Glucose)     Status: Abnormal   Collection Time: 06/12/24  9:13 AM  Result Value Ref Range   Glucose Fasting, POC 122 (A) 70 - 99 mg/dL  [id number]     Status: Abnormal   Collection Time: 06/12/24  9:39 AM  Result Value Ref Range   Glucose 123 (H) 70 - 99 mg/dL   BUN 13 8 - 27 mg/dL   Creatinine, Ser 8.93 (H) 0.57 - 1.00 mg/dL   eGFR 59 (L) >40 fO/fpw/8.26   BUN/Creatinine Ratio 12 12 - 28   Sodium 140 134 - 144 mmol/L   Potassium 3.8 3.5 - 5.2 mmol/L   Chloride 100 96 - 106 mmol/L   CO2 26 20 - 29 mmol/L   Calcium  9.3 8.7 - 10.3 mg/dL   Total Protein 6.8 6.0 - 8.5 g/dL   Albumin  4.2 3.9 - 4.9 g/dL   Globulin, Total 2.6 1.5 - 4.5 g/dL   Bilirubin Total 0.5 0.0 - 1.2 mg/dL   Alkaline Phosphatase 80 44 - 121 IU/L   AST 12 0 - 40 IU/L   ALT 7 0 - 32 IU/L  Lipid panel     Status: None   Collection Time: 06/12/24  9:39 AM  Result Value Ref Range   Cholesterol, Total 155 100 - 199 mg/dL   Triglycerides 58 0 - 149 mg/dL   HDL 58 >60 mg/dL    VLDL Cholesterol Cal 12 5 - 40 mg/dL   LDL Chol Calc (NIH) 85 0 - 99 mg/dL   Chol/HDL Ratio 2.7 0.0 - 4.4 ratio    Comment:                                   T. Chol/HDL Ratio                                             Men  Women  1/2 Avg.Risk  3.4    3.3                                   Avg.Risk  5.0    4.4                                2X Avg.Risk  9.6    7.1                                3X Avg.Risk 23.4   11.0   Iron, TIBC and Ferritin Panel     Status: None   Collection Time: 06/12/24  9:39 AM  Result Value Ref Range   Total Iron Binding Capacity 318 250 - 450 ug/dL   UIBC 752 881 - 630 ug/dL   Iron 71 27 - 860 ug/dL   Iron Saturation 22 15 - 55 %   Ferritin 40 15 - 150 ng/mL  VITAMIN D  25 Hydroxy (Vit-D Deficiency, Fractures)     Status: None   Collection Time: 06/12/24  9:39 AM  Result Value Ref Range   Vit D, 25-Hydroxy 35.5 30.0 - 100.0 ng/mL    Comment: Vitamin D  deficiency has been defined by the Institute of Medicine and an Endocrine Society practice guideline as a level of serum 25-OH vitamin D  less than 20 ng/mL (1,2). The Endocrine Society went on to further define vitamin D  insufficiency as a level between 21 and 29 ng/mL (2). 1. IOM (Institute of Medicine). 2010. Dietary reference    intakes for calcium  and D. [place]: The    [employer]. 2. Holick MF, Binkley Waves, Bischoff-Ferrari HA, et al.    Evaluation, treatment, and prevention of vitamin D     deficiency: an Endocrine Society clinical practice    guideline. JCEM. 2011 Jul; 96(7):1911-30.   Vitamin B12     Status: Abnormal   Collection Time: 06/12/24  9:39 AM  Result Value Ref Range   Vitamin B-12 >2000 (H) 232 - 1245 pg/mL  CBC with Diff     Status: None   Collection Time: 06/12/24  9:39 AM  Result Value Ref Range   WBC 4.0 3.4 - 10.8 x10E3/uL   RBC 4.24 3.77 - 5.28 x10E6/uL   Hemoglobin 12.1 11.1 - 15.9 g/dL   Hematocrit 63.0 65.9 - 46.6 %   MCV 87  79 - 97 fL   MCH 28.5 26.6 - 33.0 pg   MCHC 32.8 31.5 - 35.7 g/dL   RDW 87.7 88.2 - 84.5 %   Platelets 337 150 - 450 x10E3/uL   Neutrophils 44 Not Estab. %   Lymphs 45 Not Estab. %   Monocytes 8 Not Estab. %   Eos 2 Not Estab. %   Basos 1 Not Estab. %   Neutrophils Absolute 1.8 1.4 - 7.0 x10E3/uL   Lymphocytes Absolute 1.8 0.7 - 3.1 x10E3/uL   Monocytes Absolute 0.3 0.1 - 0.9 x10E3/uL   EOS (ABSOLUTE) 0.1 0.0 - 0.4 x10E3/uL   Basophils Absolute 0.1 0.0 - 0.2 x10E3/uL   Immature Granulocytes 0 Not Estab. %   Immature Grans (Abs) 0.0 0.0 - 0.1 x10E3/uL  Hemoglobin A1c     Status: Abnormal   Collection Time: 06/12/24  9:39 AM  Result Value Ref Range   Hgb A1c  MFr Bld 6.5 (H) 4.8 - 5.6 %    Comment:          Prediabetes: 5.7 - 6.4          Diabetes: >6.4          Glycemic control for adults with diabetes: <7.0    Est. average glucose Bld gHb Est-mCnc 140 mg/dL  TSH     Status: None   Collection Time: 06/12/24  9:39 AM  Result Value Ref Range   TSH 1.930 0.450 - 4.500 uIU/mL  POC CREATINE & ALBUMIN ,URINE     Status: Normal   Collection Time: 06/12/24 10:17 AM  Result Value Ref Range   Microalbumin Ur, POC 10 mg/L   Creatinine, POC 50 mg/dL   Albumin /Creatinine Ratio, Urine, POC <30   Urinalysis, Routine w reflex microscopic -Urine, Clean Catch     Status: Abnormal   Collection Time: 08/07/24  6:50 PM  Result Value Ref Range   Color, Urine YELLOW (A) YELLOW   APPearance HAZY (A) CLEAR   Specific Gravity, Urine 1.014 1.005 - 1.030   pH 5.0 5.0 - 8.0   Glucose, UA 50 (A) NEGATIVE mg/dL   Hgb urine dipstick SMALL (A) NEGATIVE   Bilirubin Urine NEGATIVE NEGATIVE   Ketones, ur NEGATIVE NEGATIVE mg/dL   Protein, ur NEGATIVE NEGATIVE mg/dL   Nitrite NEGATIVE NEGATIVE   Leukocytes,Ua TRACE (A) NEGATIVE   RBC / HPF 6-10 0 - 5 RBC/hpf   WBC, UA 0-5 0 - 5 WBC/hpf   Bacteria, UA NONE SEEN NONE SEEN   Squamous Epithelial / HPF 6-10 0 - 5 /HPF   Mucus PRESENT     Comment: Performed  at [facility], [address] [place], [place] [address]  Lipase, blood     Status: None   Collection Time: 08/07/24  6:52 PM  Result Value Ref Range   Lipase 51 11 - 51 U/L    Comment: Performed at [facility], [address] [place], [place] [address]  Comprehensive metabolic panel     Status: Abnormal   Collection Time: 08/07/24  6:52 PM  Result Value Ref Range   Sodium 136 135 - 145 mmol/L   Potassium 2.7 (LL) 3.5 - 5.1 mmol/L    Comment: CRITICAL RESULT CALLED TO, READ BACK BY AND VERIFIED WITH PAYTON MAHAN @2013  ON 08/07/24 SKL CORRECTED ON 08/27 AT 2015: PREVIOUSLY REPORTED AS 2.7 CRITICAL RESULT CALLED TO, READ BACK BY AND VERIFIED WITH ERIKA TRUJILLO @2007  ON 08/07/24 SKL    Chloride 101 98 - 111 mmol/L   CO2 25 22 - 32 mmol/L   Glucose, Bld 220 (H) 70 - 99 mg/dL    Comment: Glucose reference range applies only to samples taken after fasting for at least 8 hours.   BUN 15 8 - 23 mg/dL   Creatinine, Ser 8.89 (H) 0.44 - 1.00 mg/dL   Calcium  8.4 (L) 8.9 - 10.3 mg/dL   Total Protein 6.6 6.5 - 8.1 g/dL   Albumin  3.6 3.5 - 5.0 g/dL   AST 16 15 - 41 U/L   ALT 10 0 - 44 U/L   Alkaline Phosphatase 71 38 - 126 U/L   Total Bilirubin 0.7 0.0 - 1.2 mg/dL   GFR, Estimated 56 (L) >60 mL/min    Comment: (NOTE) Calculated using the CKD-EPI Creatinine Equation (2021)    Anion gap 10 5 - 15    Comment: Performed at [facility], [address] [place], [place] [address]  CBC     Status: None   Collection Time: 08/07/24  6:52 PM  Result Value Ref Range   WBC 7.5 4.0 - 10.5 K/uL   RBC 4.54 3.87 - 5.11 MIL/uL   Hemoglobin 12.8 12.0 - 15.0 g/dL   HCT 61.7 63.9 - 53.9 %   MCV 84.1 80.0 - 100.0 fL   MCH 28.2 26.0 - 34.0 pg   MCHC 33.5 30.0 - 36.0 g/dL   RDW 87.3 88.4 - 84.4 %   Platelets 295 150 - 400 K/uL   nRBC 0.0 0.0 - 0.2 %    Comment: Performed at [facility], [address], [place], [place] [address]      Assessment & Plan:   Potassium 40 meq daily Recheck BMP Tuesday Macrobid  for UTI  Problem List Items Addressed This Visit       Genitourinary   Acute cystitis without hematuria     Other   Hypokalemia - Primary   Relevant Orders   Basic metabolic panel with GFR    Return if symptoms worsen or fail to improve, for as scheduled with Alan.   Total time spent: 25 minutes  [employer], NP  08/08/2024   This document may have been prepared by Dragon Voice Recognition software and as such may include unintentional dictation errors.

## 2024-08-16 ENCOUNTER — Other Ambulatory Visit

## 2024-08-16 DIAGNOSIS — M542 Cervicalgia: Secondary | ICD-10-CM | POA: Diagnosis not present

## 2024-08-16 DIAGNOSIS — M25511 Pain in right shoulder: Secondary | ICD-10-CM | POA: Diagnosis not present

## 2024-08-16 DIAGNOSIS — E876 Hypokalemia: Secondary | ICD-10-CM | POA: Diagnosis not present

## 2024-08-17 LAB — BASIC METABOLIC PANEL WITH GFR
BUN/Creatinine Ratio: 9 — ABNORMAL LOW (ref 12–28)
BUN: 10 mg/dL (ref 8–27)
CO2: 24 mmol/L (ref 20–29)
Calcium: 9 mg/dL (ref 8.7–10.3)
Chloride: 100 mmol/L (ref 96–106)
Creatinine, Ser: 1.15 mg/dL — ABNORMAL HIGH (ref 0.57–1.00)
Glucose: 175 mg/dL — ABNORMAL HIGH (ref 70–99)
Potassium: 4 mmol/L (ref 3.5–5.2)
Sodium: 137 mmol/L (ref 134–144)
eGFR: 54 mL/min/1.73 — ABNORMAL LOW (ref >59)

## 2024-08-18 ENCOUNTER — Other Ambulatory Visit: Payer: Self-pay | Admitting: Family

## 2024-08-19 ENCOUNTER — Ambulatory Visit: Payer: Self-pay | Admitting: Cardiology

## 2024-08-19 ENCOUNTER — Encounter: Payer: Self-pay | Admitting: Cardiology

## 2024-08-19 ENCOUNTER — Ambulatory Visit (INDEPENDENT_AMBULATORY_CARE_PROVIDER_SITE_OTHER): Admitting: Cardiology

## 2024-08-19 VITALS — BP 112/68 | HR 60 | Ht 63.0 in | Wt 136.6 lb

## 2024-08-19 DIAGNOSIS — R8290 Unspecified abnormal findings in urine: Secondary | ICD-10-CM

## 2024-08-19 DIAGNOSIS — J069 Acute upper respiratory infection, unspecified: Secondary | ICD-10-CM | POA: Diagnosis not present

## 2024-08-19 DIAGNOSIS — N3000 Acute cystitis without hematuria: Secondary | ICD-10-CM

## 2024-08-19 LAB — POCT URINALYSIS DIPSTICK
Bilirubin, UA: NEGATIVE
Blood, UA: POSITIVE
Glucose, UA: NEGATIVE
Ketones, UA: NEGATIVE
Nitrite, UA: NEGATIVE
Protein, UA: NEGATIVE
Spec Grav, UA: 1.015 (ref 1.010–1.025)
Urobilinogen, UA: 0.2 U/dL
pH, UA: 7 (ref 5.0–8.0)

## 2024-08-19 MED ORDER — LEVOFLOXACIN 750 MG PO TABS: 750.0000 mg | ORAL_TABLET | Freq: Every day | ORAL | 0 refills | Status: AC

## 2024-08-19 NOTE — Progress Notes (Signed)
 Established Patient Office Visit  Subjective:  Patient ID: Kayla Malone, female    DOB: Oct 13, 1961  Age: 63 y.o. MRN: [id number]  Chief Complaint  Patient presents with   Acute Visit    Cough x 2 weeks     Patient in office for an acute visit, complaining of a cough for two weeks. Patient also experiencing a runny nose, PND, sore throat, and nasal congestion. Cough productive of yellow sputum. Lungs clear on exam.  Patient finishing a course of Macrobid  for a UTI, continues to complain of dark urine. UA abnormal, will send for culture. Will send in Levaquin . Recommend taking a probitoic or eating yogurt while on an antibitioic.   Cough This is a new problem. The current episode started 1 to 4 weeks ago. The problem has been gradually worsening. The cough is Productive of purulent sputum. Associated symptoms include nasal congestion, postnasal drip, rhinorrhea and a sore throat. Pertinent negatives include no chest pain, fever, headaches, myalgias or shortness of breath. Nothing aggravates the symptoms. The treatment provided mild relief. Her past medical history is significant for asthma.    No other concerns at this time.   Past Medical History:  Diagnosis Date   Adenomatous colon polyp 02/20/2014   Asthma    Benign neoplasm of colon 02/20/2014   Chest pain 09/13/2013   Coronary angioplasty status 11/13/2018   Coronary artery disease    Depression    Diabetes mellitus without complication (HCC)    GERD (gastroesophageal reflux disease)    History of hiatal hernia    Hypertension    Hypokalemia 05/29/2019   Hypomagnesemia 07/02/2022   Nausea with vomiting, unspecified 06/07/2019   NSTEMI (non-ST elevated myocardial infarction) (HCC) 09/23/2018   Pain    BACK   Stroke (HCC)    x3   Thyroid  cyst    TIA (transient ischemic attack) 08/06/2017   Unstable angina (HCC) 10/20/2017   Weight loss 06/07/2019    Past Surgical History:  Procedure Laterality Date    ABDOMINAL HYSTERECTOMY     CARDIAC SURGERY     CATARACT EXTRACTION W/PHACO Left 08/15/2017   Procedure: CATARACT EXTRACTION PHACO AND INTRAOCULAR LENS PLACEMENT (IOC);  Surgeon: Jaye Fallow, MD;  Location: [facility] ORS;  Service: Ophthalmology;  Laterality: Left;  US  00:40.6 AP% 7.3 CDE 2.98 FLUID PACK LOT # [id number]   CORONARY ARTERY BYPASS GRAFT N/A 10/23/2017   Procedure: CORONARY ARTERY BYPASS GRAFTING (CABG) x two  , using left internal mammary artery and right leg greater saphenous vein harvested  endoscopically - LIMA to LAD, SVG to RCA;  Surgeon: Army Dallas NOVAK, MD;  Location: [facility] OR;  Service: Open Heart Surgery;  Laterality: N/A;   CORONARY STENT INTERVENTION N/A 09/24/2018   Procedure: CORONARY STENT INTERVENTION;  Surgeon: Dann Candyce RAMAN, MD;  Location: [facility] INVASIVE CV LAB;  Service: Cardiovascular;  Laterality: N/A;   ESOPHAGOGASTRODUODENOSCOPY N/A 05/30/2019   Procedure: ESOPHAGOGASTRODUODENOSCOPY (EGD);  Surgeon: Toledo, Ladell POUR, MD;  Location: [facility] ENDOSCOPY;  Service: Gastroenterology;  Laterality: N/A;   EYE SURGERY     LEFT HEART CATH AND CORONARY ANGIOGRAPHY Left 10/09/2017   Procedure: LEFT HEART CATH AND CORONARY ANGIOGRAPHY;  Surgeon: Fernand Denyse LABOR, MD;  Location: [facility] INVASIVE CV LAB;  Service: Cardiovascular;  Laterality: Left;   LEFT HEART CATH AND CORS/GRAFTS ANGIOGRAPHY N/A 09/24/2018   Procedure: LEFT HEART CATH AND CORS/GRAFTS ANGIOGRAPHY;  Surgeon: Dann Candyce RAMAN, MD;  Location: [facility] INVASIVE CV LAB;  Service: Cardiovascular;  Laterality: N/A;  LEFT HEART CATH AND CORS/GRAFTS ANGIOGRAPHY N/A 07/04/2022   Procedure: LEFT HEART CATH AND CORS/GRAFTS ANGIOGRAPHY;  Surgeon: Fernand Denyse LABOR, MD;  Location: [facility] INVASIVE CV LAB;  Service: Cardiovascular;  Laterality: N/A;   TEE WITHOUT CARDIOVERSION N/A 10/23/2017   Procedure: TRANSESOPHAGEAL ECHOCARDIOGRAM (TEE);  Surgeon: Army Dallas NOVAK, MD;  Location: [facility] OR;  Service: Open Heart Surgery;  Laterality:  N/A;    Social History   Socioeconomic History   Marital status: Married    Spouse name: Not on file   Number of children: Not on file   Years of education: Not on file   Highest education level: Not on file  Occupational History   Not on file  Tobacco Use   Smoking status: Former    Current packs/day: 0.00    Types: Cigarettes    Quit date: 09/13/1998    Years since quitting: 25.9   Smokeless tobacco: Never  Vaping Use   Vaping status: Never Used  Substance and Sexual Activity   Alcohol use: No   Drug use: No   Sexual activity: Yes    Birth control/protection: Post-menopausal  Other Topics Concern   Not on file  Social History Narrative   Not on file   Social Drivers of Health   Financial Resource Strain: Low Risk  (12/10/2023)   Overall Financial Resource Strain (CARDIA)    Difficulty of Paying Living Expenses: Not hard at all  Food Insecurity: No Food Insecurity (11/30/2023)   Hunger Vital Sign    Worried About Running Out of Food in the Last Year: Never true    Ran Out of Food in the Last Year: Never true  Transportation Needs: No Transportation Needs (12/10/2023)   PRAPARE - Administrator, Civil Service (Medical): No    Lack of Transportation (Non-Medical): No  Physical Activity: Insufficiently Active (11/30/2023)   Exercise Vital Sign    Days of Exercise per Week: 5 days    Minutes of Exercise per Session: 10 min  Stress: No Stress Concern Present (11/30/2023)   [employer] of Occupational Health - Occupational Stress Questionnaire    Feeling of Stress : Not at all  Social Connections: Moderately Isolated (12/10/2023)   Social Connection and Isolation Panel    Frequency of Communication with Friends and Family: More than three times a week    Frequency of Social Gatherings with Friends and Family: More than three times a week    Attends Religious Services: More than 4 times per year    Active Member of [employer] or Organizations: No     Attends Banker Meetings: Never    Marital Status: Widowed  Intimate Partner Violence: Not At Risk (11/30/2023)   Humiliation, Afraid, Rape, and Kick questionnaire    Fear of Current or Ex-Partner: No    Emotionally Abused: No    Physically Abused: No    Sexually Abused: No    Family History  Problem Relation Age of Onset   Heart failure Mother    Pulmonary embolism Mother    Cirrhosis Father    Coronary artery disease Father    Diabetes type II Father    Hypertension Father    Kidney disease Father    Coronary artery disease Sister    Stroke Sister    COPD Brother    Coronary artery disease Brother    Breast cancer Neg Hx     Allergies  Allergen Reactions   Lisinopril Swelling and Shortness Of Breath  Meloxicam  Nausea Only and Nausea And Vomiting   Tramadol Nausea Only and Other (See Comments)    sleepiness  Other reaction(s): Dizziness, Other, Other (See Comments)  somnolence  Other reaction(s): Other (See Comments), Other (See Comments)  Would not wake up after taking this medication   Amoxicillin Rash and Dermatitis    Don't remember   Don't remember   Amoxicillin-Pot Clavulanate Hives, Rash and Dermatitis   Clavulanic Acid Rash    Outpatient Medications Prior to Visit  Medication Sig   Accu-Chek Softclix Lancets lancets USE TO TEST SUGARS TWICE DAILY   acetaminophen  (TYLENOL ) 500 MG tablet Take 1,000 mg by mouth every 6 (six) hours as needed for mild pain or headache.   aspirin  EC 81 MG tablet Take 81 mg by mouth daily.   atorvastatin  (LIPITOR ) 80 MG tablet TAKE 1 TABLET EVERY DAY   Blood Glucose Monitoring Suppl (ACCU-CHEK GUIDE ME) w/Device KIT    chlorthalidone (HYGROTON) 25 MG tablet TAKE 1 TABLET EVERY DAY   clopidogrel  (PLAVIX ) 75 MG tablet TAKE 1 TABLET EVERY DAY   fluticasone  (FLONASE ) 50 MCG/ACT nasal spray USE 2 SPRAYS IN EACH NOSTRIL EVERY DAY   glucose blood (ACCU-CHEK GUIDE) test strip Blood Glucose Test In Vitro Strip QTY: 100  strip Days: 90 Refills: 3  Written: 06/07/19 Patient Instructions: E11.9 - to check blodd sugars once daily   isosorbide  mononitrate (IMDUR ) 30 MG 24 hr tablet TAKE 1 TABLET EVERY DAY   losartan  (COZAAR ) 25 MG tablet TAKE 1 TABLET EVERY DAY   methocarbamol  (ROBAXIN ) 500 MG tablet Take 500 mg by mouth at bedtime as needed for muscle spasms.   metoprolol  succinate (TOPROL  XL) 25 MG 24 hr tablet Take 1 tablet (25 mg total) by mouth daily.   nitroGLYCERIN  (NITROSTAT ) 0.4 MG SL tablet Place 1 tablet (0.4 mg total) under the tongue every 5 (five) minutes x 3 doses as needed for chest pain.   ondansetron  (ZOFRAN -ODT) 4 MG disintegrating tablet DISSOLVE 1 TABLET IN MOUTH UP TO EVERY 8 HOURS AS NEEDED FOR NAUSEA   pantoprazole  (PROTONIX ) 40 MG tablet TAKE 1 TABLET BY MOUTH EVERY MORNING 30 MINS BEFORE FOOD   potassium chloride  SA (KLOR-CON  M) 20 MEQ tablet Take 2 tablets (40 mEq total) by mouth daily.   ranolazine (RANEXA) 500 MG 12 hr tablet TAKE 1 TABLET TWICE DAILY   sucralfate  (CARAFATE ) 1 g tablet TAKE 1 TABLET BY MOUTH 4 TIMES DAILY.   tiZANidine (ZANAFLEX) 4 MG tablet Take 4 mg by mouth every 6 (six) hours.   [DISCONTINUED] Zinc  Oxide 16 % OINT Apply 1 Application topically 4 (four) times daily as needed.   No facility-administered medications prior to visit.    Review of Systems  Constitutional: Negative.  Negative for fever.  HENT:  Positive for congestion, postnasal drip, rhinorrhea and sore throat. Negative for sinus pain.   Eyes: Negative.   Respiratory:  Positive for cough and sputum production. Negative for shortness of breath.   Cardiovascular: Negative.  Negative for chest pain.  Gastrointestinal: Negative.  Negative for abdominal pain, constipation and diarrhea.  Genitourinary: Negative.   Musculoskeletal:  Negative for joint pain and myalgias.  Skin: Negative.   Neurological: Negative.  Negative for dizziness and headaches.  Endo/Heme/Allergies: Negative.   All other systems  reviewed and are negative.      Objective:   BP 112/68   Pulse 60   Ht 5' 3 (1.6 m)   Wt 136 lb 9.6 oz (62 kg)   SpO2 97%  BMI 24.20 kg/m   Vitals:   08/19/24 1045  BP: 112/68  Pulse: 60  Height: 5' 3 (1.6 m)  Weight: 136 lb 9.6 oz (62 kg)  SpO2: 97%  BMI (Calculated): 24.2    Physical Exam Vitals and nursing note reviewed.  Constitutional:      Appearance: Normal appearance. She is normal weight.  HENT:     Head: Normocephalic and atraumatic.     Nose: Nose normal.     Mouth/Throat:     Mouth: Mucous membranes are moist.  Eyes:     Extraocular Movements: Extraocular movements intact.     Conjunctiva/sclera: Conjunctivae normal.     Pupils: Pupils are equal, round, and reactive to light.  Cardiovascular:     Rate and Rhythm: Normal rate and regular rhythm.     Pulses: Normal pulses.     Heart sounds: Normal heart sounds.  Pulmonary:     Effort: Pulmonary effort is normal.     Breath sounds: Normal breath sounds.  Abdominal:     General: Abdomen is flat. Bowel sounds are normal.     Palpations: Abdomen is soft.  Musculoskeletal:        General: Normal range of motion.     Cervical back: Normal range of motion.  Skin:    General: Skin is warm and dry.  Neurological:     General: No focal deficit present.     Mental Status: She is alert and oriented to person, place, and time.  Psychiatric:        Mood and Affect: Mood normal.        Behavior: Behavior normal.        Thought Content: Thought content normal.        Judgment: Judgment normal.      Results for orders placed or performed in visit on 08/19/24  POCT Urinalysis Dipstick (81002)  Result Value Ref Range   Color, UA     Clarity, UA     Glucose, UA Negative Negative   Bilirubin, UA Negative    Ketones, UA Negative    Spec Grav, UA 1.015 1.010 - 1.025   Blood, UA Positive    pH, UA 7.0 5.0 - 8.0   Protein, UA Negative Negative   Urobilinogen, UA 0.2 0.2 or 1.0 E.U./dL   Nitrite, UA  Negative    Leukocytes, UA Trace (A) Negative   Appearance     Odor      Recent Results (from the past 2160 hours)  POCT CBG (Fasting - Glucose)     Status: Abnormal   Collection Time: 06/12/24  9:13 AM  Result Value Ref Range   Glucose Fasting, POC 122 (A) 70 - 99 mg/dL  [id number]     Status: Abnormal   Collection Time: 06/12/24  9:39 AM  Result Value Ref Range   Glucose 123 (H) 70 - 99 mg/dL   BUN 13 8 - 27 mg/dL   Creatinine, Ser 8.93 (H) 0.57 - 1.00 mg/dL   eGFR 59 (L) >40 fO/fpw/8.26   BUN/Creatinine Ratio 12 12 - 28   Sodium 140 134 - 144 mmol/L   Potassium 3.8 3.5 - 5.2 mmol/L   Chloride 100 96 - 106 mmol/L   CO2 26 20 - 29 mmol/L   Calcium  9.3 8.7 - 10.3 mg/dL   Total Protein 6.8 6.0 - 8.5 g/dL   Albumin  4.2 3.9 - 4.9 g/dL   Globulin, Total 2.6 1.5 - 4.5 g/dL   Bilirubin Total 0.5  0.0 - 1.2 mg/dL   Alkaline Phosphatase 80 44 - 121 IU/L   AST 12 0 - 40 IU/L   ALT 7 0 - 32 IU/L  Lipid panel     Status: None   Collection Time: 06/12/24  9:39 AM  Result Value Ref Range   Cholesterol, Total 155 100 - 199 mg/dL   Triglycerides 58 0 - 149 mg/dL   HDL 58 >60 mg/dL   VLDL Cholesterol Cal 12 5 - 40 mg/dL   LDL Chol Calc (NIH) 85 0 - 99 mg/dL   Chol/HDL Ratio 2.7 0.0 - 4.4 ratio    Comment:                                   T. Chol/HDL Ratio                                             Men  Women                               1/2 Avg.Risk  3.4    3.3                                   Avg.Risk  5.0    4.4                                2X Avg.Risk  9.6    7.1                                3X Avg.Risk 23.4   11.0   Iron, TIBC and Ferritin Panel     Status: None   Collection Time: 06/12/24  9:39 AM  Result Value Ref Range   Total Iron Binding Capacity 318 250 - 450 ug/dL   UIBC 752 881 - 630 ug/dL   Iron 71 27 - 860 ug/dL   Iron Saturation 22 15 - 55 %   Ferritin 40 15 - 150 ng/mL  VITAMIN D  25 Hydroxy (Vit-D Deficiency, Fractures)     Status: None   Collection  Time: 06/12/24  9:39 AM  Result Value Ref Range   Vit D, 25-Hydroxy 35.5 30.0 - 100.0 ng/mL    Comment: Vitamin D  deficiency has been defined by the Institute of Medicine and an Endocrine Society practice guideline as a level of serum 25-OH vitamin D  less than 20 ng/mL (1,2). The Endocrine Society went on to further define vitamin D  insufficiency as a level between 21 and 29 ng/mL (2). 1. IOM (Institute of Medicine). 2010. Dietary reference    intakes for calcium  and D. [place]: The    [employer]. 2. Holick MF, Binkley [place], Bischoff-Ferrari HA, et al.    Evaluation, treatment, and prevention of vitamin D     deficiency: an Endocrine Society clinical practice    guideline. JCEM. 2011 Jul; 96(7):1911-30.   Vitamin B12     Status: Abnormal   Collection Time: 06/12/24  9:39 AM  Result Value Ref Range   Vitamin B-12 >2000 (H) 232 -  1245 pg/mL  CBC with Diff     Status: None   Collection Time: 06/12/24  9:39 AM  Result Value Ref Range   WBC 4.0 3.4 - 10.8 x10E3/uL   RBC 4.24 3.77 - 5.28 x10E6/uL   Hemoglobin 12.1 11.1 - 15.9 g/dL   Hematocrit 63.0 65.9 - 46.6 %   MCV 87 79 - 97 fL   MCH 28.5 26.6 - 33.0 pg   MCHC 32.8 31.5 - 35.7 g/dL   RDW 87.7 88.2 - 84.5 %   Platelets 337 150 - 450 x10E3/uL   Neutrophils 44 Not Estab. %   Lymphs 45 Not Estab. %   Monocytes 8 Not Estab. %   Eos 2 Not Estab. %   Basos 1 Not Estab. %   Neutrophils Absolute 1.8 1.4 - 7.0 x10E3/uL   Lymphocytes Absolute 1.8 0.7 - 3.1 x10E3/uL   Monocytes Absolute 0.3 0.1 - 0.9 x10E3/uL   EOS (ABSOLUTE) 0.1 0.0 - 0.4 x10E3/uL   Basophils Absolute 0.1 0.0 - 0.2 x10E3/uL   Immature Granulocytes 0 Not Estab. %   Immature Grans (Abs) 0.0 0.0 - 0.1 x10E3/uL  Hemoglobin A1c     Status: Abnormal   Collection Time: 06/12/24  9:39 AM  Result Value Ref Range   Hgb A1c MFr Bld 6.5 (H) 4.8 - 5.6 %    Comment:          Prediabetes: 5.7 - 6.4          Diabetes: >6.4          Glycemic control for adults  with diabetes: <7.0    Est. average glucose Bld gHb Est-mCnc 140 mg/dL  TSH     Status: None   Collection Time: 06/12/24  9:39 AM  Result Value Ref Range   TSH 1.930 0.450 - 4.500 uIU/mL  POC CREATINE & ALBUMIN ,URINE     Status: Normal   Collection Time: 06/12/24 10:17 AM  Result Value Ref Range   Microalbumin Ur, POC 10 mg/L   Creatinine, POC 50 mg/dL   Albumin /Creatinine Ratio, Urine, POC <30   Urinalysis, Routine w reflex microscopic -Urine, Clean Catch     Status: Abnormal   Collection Time: 08/07/24  6:50 PM  Result Value Ref Range   Color, Urine YELLOW (A) YELLOW   APPearance HAZY (A) CLEAR   Specific Gravity, Urine 1.014 1.005 - 1.030   pH 5.0 5.0 - 8.0   Glucose, UA 50 (A) NEGATIVE mg/dL   Hgb urine dipstick SMALL (A) NEGATIVE   Bilirubin Urine NEGATIVE NEGATIVE   Ketones, ur NEGATIVE NEGATIVE mg/dL   Protein, ur NEGATIVE NEGATIVE mg/dL   Nitrite NEGATIVE NEGATIVE   Leukocytes,Ua TRACE (A) NEGATIVE   RBC / HPF 6-10 0 - 5 RBC/hpf   WBC, UA 0-5 0 - 5 WBC/hpf   Bacteria, UA NONE SEEN NONE SEEN   Squamous Epithelial / HPF 6-10 0 - 5 /HPF   Mucus PRESENT     Comment: Performed at [facility], [address] [place], [place] [address]  Lipase, blood     Status: None   Collection Time: 08/07/24  6:52 PM  Result Value Ref Range   Lipase 51 11 - 51 U/L    Comment: Performed at [facility], [address] [place], [place] [address]  Comprehensive metabolic panel     Status: Abnormal   Collection Time: 08/07/24  6:52 PM  Result Value Ref Range   Sodium 136 135 - 145 mmol/L  Potassium 2.7 (LL) 3.5 - 5.1 mmol/L    Comment: CRITICAL RESULT CALLED TO, READ BACK BY AND VERIFIED WITH PAYTON MAHAN @2013  ON 08/07/24 SKL CORRECTED ON 08/27 AT 2015: PREVIOUSLY REPORTED AS 2.7 CRITICAL RESULT CALLED TO, READ BACK BY AND VERIFIED WITH ERIKA TRUJILLO @2007  ON 08/07/24 SKL    Chloride 101 98 - 111 mmol/L   CO2 25 22 - 32 mmol/L   Glucose, Bld 220 (H) 70 - 99  mg/dL    Comment: Glucose reference range applies only to samples taken after fasting for at least 8 hours.   BUN 15 8 - 23 mg/dL   Creatinine, Ser 8.89 (H) 0.44 - 1.00 mg/dL   Calcium  8.4 (L) 8.9 - 10.3 mg/dL   Total Protein 6.6 6.5 - 8.1 g/dL   Albumin  3.6 3.5 - 5.0 g/dL   AST 16 15 - 41 U/L   ALT 10 0 - 44 U/L   Alkaline Phosphatase 71 38 - 126 U/L   Total Bilirubin 0.7 0.0 - 1.2 mg/dL   GFR, Estimated 56 (L) >60 mL/min    Comment: (NOTE) Calculated using the CKD-EPI Creatinine Equation (2021)    Anion gap 10 5 - 15    Comment: Performed at [facility], [address] [place], [place] [address]  CBC     Status: None   Collection Time: 08/07/24  6:52 PM  Result Value Ref Range   WBC 7.5 4.0 - 10.5 K/uL   RBC 4.54 3.87 - 5.11 MIL/uL   Hemoglobin 12.8 12.0 - 15.0 g/dL   HCT 61.7 63.9 - 53.9 %   MCV 84.1 80.0 - 100.0 fL   MCH 28.2 26.0 - 34.0 pg   MCHC 33.5 30.0 - 36.0 g/dL   RDW 87.3 88.4 - 84.4 %   Platelets 295 150 - 400 K/uL   nRBC 0.0 0.0 - 0.2 %    Comment: Performed at [facility], [address], [place], [place] [address]  Basic metabolic panel with GFR     Status: Abnormal   Collection Time: 08/16/24  1:25 PM  Result Value Ref Range   Glucose 175 (H) 70 - 99 mg/dL   BUN 10 8 - 27 mg/dL   Creatinine, Ser 8.84 (H) 0.57 - 1.00 mg/dL   eGFR 54 (L) >40 fO/fpw/8.26   BUN/Creatinine Ratio 9 (L) 12 - 28   Sodium 137 134 - 144 mmol/L   Potassium 4.0 3.5 - 5.2 mmol/L   Chloride 100 96 - 106 mmol/L   CO2 24 20 - 29 mmol/L   Calcium  9.0 8.7 - 10.3 mg/dL  POCT Urinalysis Dipstick (18997)     Status: Abnormal   Collection Time: 08/19/24 11:09 AM  Result Value Ref Range   Color, UA     Clarity, UA     Glucose, UA Negative Negative   Bilirubin, UA Negative    Ketones, UA Negative    Spec Grav, UA 1.015 1.010 - 1.025   Blood, UA Positive    pH, UA 7.0 5.0 - 8.0   Protein, UA Negative Negative   Urobilinogen, UA 0.2 0.2 or 1.0 E.U./dL   Nitrite,  UA Negative    Leukocytes, UA Trace (A) Negative   Appearance     Odor        Assessment & Plan:  Urine culture Levofloxacin  Probiotic or yogurt  Problem List Items Addressed This Visit       Respiratory   Acute URI - Primary  Genitourinary   Acute cystitis without hematuria   Other Visit Diagnoses       Abnormal urine       Relevant Orders   POCT Urinalysis Dipstick (18997) (Completed)   Urine Culture       Return if symptoms worsen or fail to improve, for as scheduled.   Total time spent: 25 minutes  [employer], NP  08/19/2024   This document may have been prepared by Dragon Voice Recognition software and as such may include unintentional dictation errors.

## 2024-08-27 DIAGNOSIS — M542 Cervicalgia: Secondary | ICD-10-CM | POA: Diagnosis not present

## 2024-08-27 DIAGNOSIS — M25511 Pain in right shoulder: Secondary | ICD-10-CM | POA: Diagnosis not present

## 2024-08-29 DIAGNOSIS — M533 Sacrococcygeal disorders, not elsewhere classified: Secondary | ICD-10-CM | POA: Diagnosis not present

## 2024-08-29 DIAGNOSIS — M791 Myalgia, unspecified site: Secondary | ICD-10-CM | POA: Diagnosis not present

## 2024-08-29 DIAGNOSIS — M51369 Other intervertebral disc degeneration, lumbar region without mention of lumbar back pain or lower extremity pain: Secondary | ICD-10-CM | POA: Diagnosis not present

## 2024-08-29 DIAGNOSIS — M1611 Unilateral primary osteoarthritis, right hip: Secondary | ICD-10-CM | POA: Diagnosis not present

## 2024-08-29 DIAGNOSIS — M5416 Radiculopathy, lumbar region: Secondary | ICD-10-CM | POA: Diagnosis not present

## 2024-09-09 ENCOUNTER — Other Ambulatory Visit: Payer: Self-pay | Admitting: Family

## 2024-09-11 DIAGNOSIS — E119 Type 2 diabetes mellitus without complications: Secondary | ICD-10-CM | POA: Diagnosis not present

## 2024-09-12 ENCOUNTER — Encounter: Payer: Self-pay | Admitting: Family

## 2024-09-12 ENCOUNTER — Ambulatory Visit (INDEPENDENT_AMBULATORY_CARE_PROVIDER_SITE_OTHER): Admitting: Family

## 2024-09-12 VITALS — BP 110/72 | HR 60 | Ht 63.0 in | Wt 138.2 lb

## 2024-09-12 DIAGNOSIS — I10 Essential (primary) hypertension: Secondary | ICD-10-CM

## 2024-09-12 DIAGNOSIS — E782 Mixed hyperlipidemia: Secondary | ICD-10-CM

## 2024-09-12 DIAGNOSIS — T82857D Stenosis of cardiac prosthetic devices, implants and grafts, subsequent encounter: Secondary | ICD-10-CM | POA: Diagnosis not present

## 2024-09-12 DIAGNOSIS — E1159 Type 2 diabetes mellitus with other circulatory complications: Secondary | ICD-10-CM | POA: Diagnosis not present

## 2024-09-12 DIAGNOSIS — E559 Vitamin D deficiency, unspecified: Secondary | ICD-10-CM

## 2024-09-12 DIAGNOSIS — E1165 Type 2 diabetes mellitus with hyperglycemia: Secondary | ICD-10-CM | POA: Diagnosis not present

## 2024-09-12 DIAGNOSIS — E538 Deficiency of other specified B group vitamins: Secondary | ICD-10-CM | POA: Diagnosis not present

## 2024-09-12 DIAGNOSIS — Z23 Encounter for immunization: Secondary | ICD-10-CM | POA: Diagnosis not present

## 2024-09-12 DIAGNOSIS — R5383 Other fatigue: Secondary | ICD-10-CM

## 2024-09-12 DIAGNOSIS — E041 Nontoxic single thyroid nodule: Secondary | ICD-10-CM | POA: Diagnosis not present

## 2024-09-12 DIAGNOSIS — I5032 Chronic diastolic (congestive) heart failure: Secondary | ICD-10-CM | POA: Diagnosis not present

## 2024-09-12 NOTE — Assessment & Plan Note (Signed)
 Checking labs today.  Continue current therapy for lipid control. Will modify as needed based on labwork results.   -CMP w/eGFR -Lipid Panel

## 2024-09-12 NOTE — Assessment & Plan Note (Signed)
 Checking labs today.  Will continue supplements as needed.   - Vitamin D  - Vitamin B12 - TSH

## 2024-09-12 NOTE — Progress Notes (Signed)
 Established Patient Office Visit  Subjective:  Patient ID: Kayla Malone, female    DOB: 10-26-1961  Age: 63 y.o. MRN: [id number]  Chief Complaint  Patient presents with   Follow-up    3 month follow up    Patient is here today for her 3 months follow up.  She has been feeling fairly well since last appointment.   She does not have additional concerns to discuss today.  Labs are due today.  She needs refills.   I have reviewed her active problem list, medication list, allergies, notes from last encounter, lab results for her appointment today.      No other concerns at this time.   Past Medical History:  Diagnosis Date   Adenomatous colon polyp 02/20/2014   Asthma    Benign neoplasm of colon 02/20/2014   Chest pain 09/13/2013   Coronary angioplasty status 11/13/2018   Coronary artery disease    Depression    Diabetes mellitus without complication (HCC)    GERD (gastroesophageal reflux disease)    History of hiatal hernia    Hypertension    Hypokalemia 05/29/2019   Hypomagnesemia 07/02/2022   Nausea with vomiting, unspecified 06/07/2019   NSTEMI (non-ST elevated myocardial infarction) (HCC) 09/23/2018   Pain    BACK   Stroke (HCC)    x3   Thyroid  cyst    TIA (transient ischemic attack) 08/06/2017   Unstable angina (HCC) 10/20/2017   Weight loss 06/07/2019    Past Surgical History:  Procedure Laterality Date   ABDOMINAL HYSTERECTOMY     CARDIAC SURGERY     CATARACT EXTRACTION W/PHACO Left 08/15/2017   Procedure: CATARACT EXTRACTION PHACO AND INTRAOCULAR LENS PLACEMENT (IOC);  Surgeon: Jaye Fallow, MD;  Location: [facility] ORS;  Service: Ophthalmology;  Laterality: Left;  US  00:40.6 AP% 7.3 CDE 2.98 FLUID PACK LOT # [id number] h   CORONARY ARTERY BYPASS GRAFT N/A 10/23/2017   Procedure: CORONARY ARTERY BYPASS GRAFTING (CABG) x two  , using left internal mammary artery and right leg greater saphenous vein harvested  endoscopically - LIMA to LAD, SVG to  RCA;  Surgeon: Army Dallas NOVAK, MD;  Location: [facility] OR;  Service: Open Heart Surgery;  Laterality: N/A;   CORONARY STENT INTERVENTION N/A 09/24/2018   Procedure: CORONARY STENT INTERVENTION;  Surgeon: Dann Candyce RAMAN, MD;  Location: [facility] INVASIVE CV LAB;  Service: Cardiovascular;  Laterality: N/A;   ESOPHAGOGASTRODUODENOSCOPY N/A 05/30/2019   Procedure: ESOPHAGOGASTRODUODENOSCOPY (EGD);  Surgeon: Toledo, Ladell POUR, MD;  Location: [facility] ENDOSCOPY;  Service: Gastroenterology;  Laterality: N/A;   EYE SURGERY     LEFT HEART CATH AND CORONARY ANGIOGRAPHY Left 10/09/2017   Procedure: LEFT HEART CATH AND CORONARY ANGIOGRAPHY;  Surgeon: Fernand Denyse LABOR, MD;  Location: [facility] INVASIVE CV LAB;  Service: Cardiovascular;  Laterality: Left;   LEFT HEART CATH AND CORS/GRAFTS ANGIOGRAPHY N/A 09/24/2018   Procedure: LEFT HEART CATH AND CORS/GRAFTS ANGIOGRAPHY;  Surgeon: Dann Candyce RAMAN, MD;  Location: [facility] INVASIVE CV LAB;  Service: Cardiovascular;  Laterality: N/A;   LEFT HEART CATH AND CORS/GRAFTS ANGIOGRAPHY N/A 07/04/2022   Procedure: LEFT HEART CATH AND CORS/GRAFTS ANGIOGRAPHY;  Surgeon: Fernand Denyse LABOR, MD;  Location: [facility] INVASIVE CV LAB;  Service: Cardiovascular;  Laterality: N/A;   TEE WITHOUT CARDIOVERSION N/A 10/23/2017   Procedure: TRANSESOPHAGEAL ECHOCARDIOGRAM (TEE);  Surgeon: Army Dallas NOVAK, MD;  Location: [facility] OR;  Service: Open Heart Surgery;  Laterality: N/A;    Social History   Socioeconomic History   Marital status: Married  Spouse name: Not on file   Number of children: Not on file   Years of education: Not on file   Highest education level: Not on file  Occupational History   Not on file  Tobacco Use   Smoking status: Former    Current packs/day: 0.00    Types: Cigarettes    Quit date: 09/13/1998    Years since quitting: 26.0   Smokeless tobacco: Never  Vaping Use   Vaping status: Never Used  Substance and Sexual Activity   Alcohol use: No   Drug use: No   Sexual  activity: Yes    Birth control/protection: Post-menopausal  Other Topics Concern   Not on file  Social History Narrative   Not on file   Social Drivers of Health   Financial Resource Strain: Low Risk  (12/10/2023)   Overall Financial Resource Strain (CARDIA)    Difficulty of Paying Living Expenses: Not hard at all  Food Insecurity: No Food Insecurity (11/30/2023)   Hunger Vital Sign    Worried About Running Out of Food in the Last Year: Never true    Ran Out of Food in the Last Year: Never true  Transportation Needs: No Transportation Needs (12/10/2023)   PRAPARE - Administrator, Civil Service (Medical): No    Lack of Transportation (Non-Medical): No  Physical Activity: Insufficiently Active (11/30/2023)   Exercise Vital Sign    Days of Exercise per Week: 5 days    Minutes of Exercise per Session: 10 min  Stress: No Stress Concern Present (11/30/2023)   [employer] of Occupational Health - Occupational Stress Questionnaire    Feeling of Stress : Not at all  Social Connections: Moderately Isolated (12/10/2023)   Social Connection and Isolation Panel    Frequency of Communication with Friends and Family: More than three times a week    Frequency of Social Gatherings with Friends and Family: More than three times a week    Attends Religious Services: More than 4 times per year    Active Member of [employer] or Organizations: No    Attends Banker Meetings: Never    Marital Status: Widowed  Intimate Partner Violence: Not At Risk (11/30/2023)   Humiliation, Afraid, Rape, and Kick questionnaire    Fear of Current or Ex-Partner: No    Emotionally Abused: No    Physically Abused: No    Sexually Abused: No    Family History  Problem Relation Age of Onset   Heart failure Mother    Pulmonary embolism Mother    Cirrhosis Father    Coronary artery disease Father    Diabetes type II Father    Hypertension Father    Kidney disease Father    Coronary  artery disease Sister    Stroke Sister    COPD Brother    Coronary artery disease Brother    Breast cancer Neg Hx     Allergies  Allergen Reactions   Lisinopril Swelling and Shortness Of Breath   Meloxicam  Nausea Only and Nausea And Vomiting   Tramadol Nausea Only and Other (See Comments)    sleepiness  Other reaction(s): Dizziness, Other, Other (See Comments)  somnolence  Other reaction(s): Other (See Comments), Other (See Comments)  Would not wake up after taking this medication   Amoxicillin Rash and Dermatitis    Don't remember   Don't remember   Amoxicillin-Pot Clavulanate Hives, Rash and Dermatitis   Clavulanic Acid Rash    Review of Systems  All other systems reviewed and are negative.      Objective:   BP 110/72   Pulse 60   Ht 5' 3 (1.6 m)   Wt 138 lb 3.2 oz (62.7 kg)   SpO2 99%   BMI 24.48 kg/m   Vitals:   09/12/24 0927  BP: 110/72  Pulse: 60  Height: 5' 3 (1.6 m)  Weight: 138 lb 3.2 oz (62.7 kg)  SpO2: 99%  BMI (Calculated): 24.49    Physical Exam Vitals and nursing note reviewed.  Constitutional:      Appearance: Normal appearance. She is normal weight.  HENT:     Head: Normocephalic.  Eyes:     Extraocular Movements: Extraocular movements intact.     Conjunctiva/sclera: Conjunctivae normal.     Pupils: Pupils are equal, round, and reactive to light.  Cardiovascular:     Rate and Rhythm: Normal rate and regular rhythm.  Pulmonary:     Effort: Pulmonary effort is normal.  Musculoskeletal:     Cervical back: Normal range of motion.  Neurological:     General: No focal deficit present.     Mental Status: She is alert and oriented to person, place, and time. Mental status is at baseline.  Psychiatric:        Mood and Affect: Mood normal.        Behavior: Behavior normal.        Thought Content: Thought content normal.        Judgment: Judgment normal.      No results found for any visits on 09/12/24.  Recent Results (from  the past 2160 hours)  Urinalysis, Routine w reflex microscopic -Urine, Clean Catch     Status: Abnormal   Collection Time: 08/07/24  6:50 PM  Result Value Ref Range   Color, Urine YELLOW (A) YELLOW   APPearance HAZY (A) CLEAR   Specific Gravity, Urine 1.014 1.005 - 1.030   pH 5.0 5.0 - 8.0   Glucose, UA 50 (A) NEGATIVE mg/dL   Hgb urine dipstick SMALL (A) NEGATIVE   Bilirubin Urine NEGATIVE NEGATIVE   Ketones, ur NEGATIVE NEGATIVE mg/dL   Protein, ur NEGATIVE NEGATIVE mg/dL   Nitrite NEGATIVE NEGATIVE   Leukocytes,Ua TRACE (A) NEGATIVE   RBC / HPF 6-10 0 - 5 RBC/hpf   WBC, UA 0-5 0 - 5 WBC/hpf   Bacteria, UA NONE SEEN NONE SEEN   Squamous Epithelial / HPF 6-10 0 - 5 /HPF   Mucus PRESENT     Comment: Performed at [facility], [address] [place], [place] [address]  Lipase, blood     Status: None   Collection Time: 08/07/24  6:52 PM  Result Value Ref Range   Lipase 51 11 - 51 U/L    Comment: Performed at [facility], [address] [place], [place] [address]  Comprehensive metabolic panel     Status: Abnormal   Collection Time: 08/07/24  6:52 PM  Result Value Ref Range   Sodium 136 135 - 145 mmol/L   Potassium 2.7 (LL) 3.5 - 5.1 mmol/L    Comment: CRITICAL RESULT CALLED TO, READ BACK BY AND VERIFIED WITH PAYTON MAHAN @2013  ON 08/07/24 SKL CORRECTED ON 08/27 AT 2015: PREVIOUSLY REPORTED AS 2.7 CRITICAL RESULT CALLED TO, READ BACK BY AND VERIFIED WITH ERIKA TRUJILLO @2007  ON 08/07/24 SKL    Chloride 101 98 - 111 mmol/L   CO2 25 22 - 32 mmol/L   Glucose, Bld 220 (H) 70 - 99  mg/dL    Comment: Glucose reference range applies only to samples taken after fasting for at least 8 hours.   BUN 15 8 - 23 mg/dL   Creatinine, Ser 8.89 (H) 0.44 - 1.00 mg/dL   Calcium  8.4 (L) 8.9 - 10.3 mg/dL   Total Protein 6.6 6.5 - 8.1 g/dL   Albumin  3.6 3.5 - 5.0 g/dL   AST 16 15 - 41 U/L   ALT 10 0 - 44 U/L   Alkaline Phosphatase 71 38 - 126 U/L   Total Bilirubin 0.7 0.0 -  1.2 mg/dL   GFR, Estimated 56 (L) >60 mL/min    Comment: (NOTE) Calculated using the CKD-EPI Creatinine Equation (2021)    Anion gap 10 5 - 15    Comment: Performed at [facility], [address] [place], [place] [address]  CBC     Status: None   Collection Time: 08/07/24  6:52 PM  Result Value Ref Range   WBC 7.5 4.0 - 10.5 K/uL   RBC 4.54 3.87 - 5.11 MIL/uL   Hemoglobin 12.8 12.0 - 15.0 g/dL   HCT 61.7 63.9 - 53.9 %   MCV 84.1 80.0 - 100.0 fL   MCH 28.2 26.0 - 34.0 pg   MCHC 33.5 30.0 - 36.0 g/dL   RDW 87.3 88.4 - 84.4 %   Platelets 295 150 - 400 K/uL   nRBC 0.0 0.0 - 0.2 %    Comment: Performed at [facility], [address], [place], [place] [address]  Basic metabolic panel with GFR     Status: Abnormal   Collection Time: 08/16/24  1:25 PM  Result Value Ref Range   Glucose 175 (H) 70 - 99 mg/dL   BUN 10 8 - 27 mg/dL   Creatinine, Ser 8.84 (H) 0.57 - 1.00 mg/dL   eGFR 54 (L) >40 fO/fpw/8.26   BUN/Creatinine Ratio 9 (L) 12 - 28   Sodium 137 134 - 144 mmol/L   Potassium 4.0 3.5 - 5.2 mmol/L   Chloride 100 96 - 106 mmol/L   CO2 24 20 - 29 mmol/L   Calcium  9.0 8.7 - 10.3 mg/dL  POCT Urinalysis Dipstick (18997)     Status: Abnormal   Collection Time: 08/19/24 11:09 AM  Result Value Ref Range   Color, UA     Clarity, UA     Glucose, UA Negative Negative   Bilirubin, UA Negative    Ketones, UA Negative    Spec Grav, UA 1.015 1.010 - 1.025   Blood, UA Positive    pH, UA 7.0 5.0 - 8.0   Protein, UA Negative Negative   Urobilinogen, UA 0.2 0.2 or 1.0 E.U./dL   Nitrite, UA Negative    Leukocytes, UA Trace (A) Negative   Appearance     Odor         Assessment & Plan Flu vaccine need Flu Vaccine given in office today.  Patient encouraged to use supportive measures for possible side effects if needed.   Chronic diastolic (congestive) heart failure [facility]) Patient is seen by cardiology, who manage this condition.  She is well controlled with  current therapy.   Will defer to them for further changes to plan of care.  Type 2 diabetes mellitus with other circulatory complication, without long-term current use of insulin  (HCC) Checking labs today. Will call pt. With results  Continue current diabetes POC, as patient has been well controlled on current regimen.  Will adjust meds if needed based on labs.   -  CBC w/Diff -CMP w/eGFR -Hemoglobin A1C  Benign essential hypertension Blood pressure well controlled with current medications.  Continue current therapy.  Will reassess at follow up.   - CBC w/Diff - CMP w/eGFR  Mixed hyperlipidemia Checking labs today.  Continue current therapy for lipid control. Will modify as needed based on labwork results.   -CMP w/eGFR -Lipid Panel  Vitamin D  deficiency, unspecified Nontoxic uninodular goiter B12 deficiency due to diet Other fatigue Checking labs today.  Will continue supplements as needed.   - Vitamin D  - Vitamin B12 - TSH     Return in about 3 months (around 12/13/2024).   Total time spent: 20 minutes  ALAN CHRISTELLA ARRANT, FNP  09/12/2024   This document may have been prepared by [facility] Voice Recognition software and as such may include unintentional dictation errors.

## 2024-09-12 NOTE — Assessment & Plan Note (Signed)
Patient is seen by cardiology, who manage this condition.  She is well controlled with current therapy.   Will defer to them for further changes to plan of care.

## 2024-09-12 NOTE — Assessment & Plan Note (Signed)
 Blood pressure well controlled with current medications.  Continue current therapy.  Will reassess at follow up.   - CBC w/Diff - CMP w/eGFR

## 2024-09-12 NOTE — Patient Instructions (Addendum)
 [email or web address]  Pupillary Distance - make sure they give you this on the prescription, you'll need it for the website.

## 2024-09-12 NOTE — Assessment & Plan Note (Signed)
 Checking labs today. Will call pt. With results  Continue current diabetes POC, as patient has been well controlled on current regimen.  Will adjust meds if needed based on labs.   -CBC w/Diff -CMP w/eGFR -Hemoglobin A1C

## 2024-09-13 LAB — CBC WITH DIFFERENTIAL/PLATELET
Basophils Absolute: 0 x10E3/uL (ref 0.0–0.2)
Basos: 1 %
EOS (ABSOLUTE): 0.1 x10E3/uL (ref 0.0–0.4)
Eos: 1 %
Hematocrit: 38.8 % (ref 34.0–46.6)
Hemoglobin: 12.9 g/dL (ref 11.1–15.9)
Immature Grans (Abs): 0 x10E3/uL (ref 0.0–0.1)
Immature Granulocytes: 0 %
Lymphocytes Absolute: 1.8 x10E3/uL (ref 0.7–3.1)
Lymphs: 45 %
MCH: 28.4 pg (ref 26.6–33.0)
MCHC: 33.2 g/dL (ref 31.5–35.7)
MCV: 86 fL (ref 79–97)
Monocytes Absolute: 0.3 x10E3/uL (ref 0.1–0.9)
Monocytes: 7 %
Neutrophils Absolute: 1.8 x10E3/uL (ref 1.4–7.0)
Neutrophils: 46 %
Platelets: 279 x10E3/uL (ref 150–450)
RBC: 4.54 x10E6/uL (ref 3.77–5.28)
RDW: 12.9 % (ref 11.7–15.4)
WBC: 3.9 x10E3/uL (ref 3.4–10.8)

## 2024-09-13 LAB — CMP14+EGFR
ALT: 9 IU/L (ref 0–32)
AST: 15 IU/L (ref 0–40)
Albumin: 4.4 g/dL (ref 3.9–4.9)
Alkaline Phosphatase: 98 IU/L (ref 49–135)
BUN/Creatinine Ratio: 12 (ref 12–28)
BUN: 13 mg/dL (ref 8–27)
Bilirubin Total: 0.5 mg/dL (ref 0.0–1.2)
CO2: 27 mmol/L (ref 20–29)
Calcium: 9.6 mg/dL (ref 8.7–10.3)
Chloride: 100 mmol/L (ref 96–106)
Creatinine, Ser: 1.1 mg/dL — ABNORMAL HIGH (ref 0.57–1.00)
Globulin, Total: 2.4 g/dL (ref 1.5–4.5)
Glucose: 138 mg/dL — ABNORMAL HIGH (ref 70–99)
Potassium: 3.9 mmol/L (ref 3.5–5.2)
Sodium: 140 mmol/L (ref 134–144)
Total Protein: 6.8 g/dL (ref 6.0–8.5)
eGFR: 56 mL/min/1.73 — ABNORMAL LOW (ref >59)

## 2024-09-13 LAB — IRON,TIBC AND FERRITIN PANEL
Ferritin: 50 ng/mL (ref 15–150)
Iron Saturation: 19 % (ref 15–55)
Iron: 64 ug/dL (ref 27–139)
Total Iron Binding Capacity: 335 ug/dL (ref 250–450)
UIBC: 271 ug/dL (ref 118–369)

## 2024-09-13 LAB — LIPID PANEL
Chol/HDL Ratio: 2.6 ratio (ref 0.0–4.4)
Cholesterol, Total: 145 mg/dL (ref 100–199)
HDL: 55 mg/dL (ref >39)
LDL Chol Calc (NIH): 79 mg/dL (ref 0–99)
Triglycerides: 53 mg/dL (ref 0–149)
VLDL Cholesterol Cal: 11 mg/dL (ref 5–40)

## 2024-09-13 LAB — VITAMIN D 25 HYDROXY (VIT D DEFICIENCY, FRACTURES): Vit D, 25-Hydroxy: 30.9 ng/mL (ref 30.0–100.0)

## 2024-09-13 LAB — VITAMIN B12: Vitamin B-12: 1126 pg/mL (ref 232–1245)

## 2024-09-13 LAB — HEMOGLOBIN A1C
Est. average glucose Bld gHb Est-mCnc: 163 mg/dL
Hgb A1c MFr Bld: 7.3 % — ABNORMAL HIGH (ref 4.8–5.6)

## 2024-09-13 LAB — TSH: TSH: 0.898 u[IU]/mL (ref 0.450–4.500)

## 2024-09-17 DIAGNOSIS — G5603 Carpal tunnel syndrome, bilateral upper limbs: Secondary | ICD-10-CM | POA: Diagnosis not present

## 2024-09-17 DIAGNOSIS — M25642 Stiffness of left hand, not elsewhere classified: Secondary | ICD-10-CM | POA: Diagnosis not present

## 2024-09-17 DIAGNOSIS — R2233 Localized swelling, mass and lump, upper limb, bilateral: Secondary | ICD-10-CM | POA: Diagnosis not present

## 2024-09-17 DIAGNOSIS — M180 Bilateral primary osteoarthritis of first carpometacarpal joints: Secondary | ICD-10-CM | POA: Diagnosis not present

## 2024-09-17 DIAGNOSIS — M25641 Stiffness of right hand, not elsewhere classified: Secondary | ICD-10-CM | POA: Diagnosis not present

## 2024-09-17 DIAGNOSIS — M65341 Trigger finger, right ring finger: Secondary | ICD-10-CM | POA: Diagnosis not present

## 2024-09-19 ENCOUNTER — Ambulatory Visit (INDEPENDENT_AMBULATORY_CARE_PROVIDER_SITE_OTHER): Admitting: Cardiovascular Disease

## 2024-09-19 ENCOUNTER — Encounter: Payer: Self-pay | Admitting: Cardiovascular Disease

## 2024-09-19 VITALS — BP 119/77 | HR 64 | Ht 63.0 in | Wt 139.2 lb

## 2024-09-19 DIAGNOSIS — I69359 Hemiplegia and hemiparesis following cerebral infarction affecting unspecified side: Secondary | ICD-10-CM

## 2024-09-19 DIAGNOSIS — I739 Peripheral vascular disease, unspecified: Secondary | ICD-10-CM | POA: Diagnosis not present

## 2024-09-19 DIAGNOSIS — T82857D Stenosis of cardiac prosthetic devices, implants and grafts, subsequent encounter: Secondary | ICD-10-CM

## 2024-09-19 DIAGNOSIS — I10 Essential (primary) hypertension: Secondary | ICD-10-CM | POA: Diagnosis not present

## 2024-09-19 DIAGNOSIS — K279 Peptic ulcer, site unspecified, unspecified as acute or chronic, without hemorrhage or perforation: Secondary | ICD-10-CM

## 2024-09-19 DIAGNOSIS — E782 Mixed hyperlipidemia: Secondary | ICD-10-CM

## 2024-09-19 DIAGNOSIS — I25118 Atherosclerotic heart disease of native coronary artery with other forms of angina pectoris: Secondary | ICD-10-CM

## 2024-09-19 DIAGNOSIS — I5032 Chronic diastolic (congestive) heart failure: Secondary | ICD-10-CM | POA: Diagnosis not present

## 2024-09-19 DIAGNOSIS — Z131 Encounter for screening for diabetes mellitus: Secondary | ICD-10-CM

## 2024-09-19 MED ORDER — REPATHA SURECLICK 140 MG/ML ~~LOC~~ SOAJ: 140.0000 mg | SUBCUTANEOUS | 2 refills | Status: DC

## 2024-09-19 NOTE — Progress Notes (Signed)
 Cardiology Office Note   Date:  09/19/2024   ID:  BITANIA Malone, DOB 17-Jun-1961, MRN [id number]  PCP:  Orlean Alan HERO, FNP  Cardiologist:  Denyse Bathe, MD      History of Present Illness: Kayla Malone is a 63 y.o. female who presents for  Chief Complaint  Patient presents with   Follow-up    3 month follow up    Has unconformable feeling like tightness, lasting only few minutes.      Past Medical History:  Diagnosis Date   Adenomatous colon polyp 02/20/2014   Asthma    Benign neoplasm of colon 02/20/2014   Chest pain 09/13/2013   Coronary angioplasty status 11/13/2018   Coronary artery disease    Depression    Diabetes mellitus without complication (HCC)    GERD (gastroesophageal reflux disease)    History of hiatal hernia    Hypertension    Hypokalemia 05/29/2019   Hypomagnesemia 07/02/2022   Nausea with vomiting, unspecified 06/07/2019   NSTEMI (non-ST elevated myocardial infarction) (HCC) 09/23/2018   Pain    BACK   Stroke (HCC)    x3   Thyroid  cyst    TIA (transient ischemic attack) 08/06/2017   Unstable angina (HCC) 10/20/2017   Weight loss 06/07/2019     Past Surgical History:  Procedure Laterality Date   ABDOMINAL HYSTERECTOMY     CARDIAC SURGERY     CATARACT EXTRACTION W/PHACO Left 08/15/2017   Procedure: CATARACT EXTRACTION PHACO AND INTRAOCULAR LENS PLACEMENT (IOC);  Surgeon: Jaye Fallow, MD;  Location: [facility] ORS;  Service: Ophthalmology;  Laterality: Left;  US  00:40.6 AP% 7.3 CDE 2.98 FLUID PACK LOT # [id number] h   CORONARY ARTERY BYPASS GRAFT N/A 10/23/2017   Procedure: CORONARY ARTERY BYPASS GRAFTING (CABG) x two  , using left internal mammary artery and right leg greater saphenous vein harvested  endoscopically - LIMA to LAD, SVG to RCA;  Surgeon: Army Dallas NOVAK, MD;  Location: [facility] OR;  Service: Open Heart Surgery;  Laterality: N/A;   CORONARY STENT INTERVENTION N/A 09/24/2018   Procedure: CORONARY STENT  INTERVENTION;  Surgeon: Dann Candyce RAMAN, MD;  Location: [facility] INVASIVE CV LAB;  Service: Cardiovascular;  Laterality: N/A;   ESOPHAGOGASTRODUODENOSCOPY N/A 05/30/2019   Procedure: ESOPHAGOGASTRODUODENOSCOPY (EGD);  Surgeon: Toledo, Ladell POUR, MD;  Location: [facility] ENDOSCOPY;  Service: Gastroenterology;  Laterality: N/A;   EYE SURGERY     LEFT HEART CATH AND CORONARY ANGIOGRAPHY Left 10/09/2017   Procedure: LEFT HEART CATH AND CORONARY ANGIOGRAPHY;  Surgeon: Bathe Denyse LABOR, MD;  Location: [facility] INVASIVE CV LAB;  Service: Cardiovascular;  Laterality: Left;   LEFT HEART CATH AND CORS/GRAFTS ANGIOGRAPHY N/A 09/24/2018   Procedure: LEFT HEART CATH AND CORS/GRAFTS ANGIOGRAPHY;  Surgeon: Dann Candyce RAMAN, MD;  Location: [facility] INVASIVE CV LAB;  Service: Cardiovascular;  Laterality: N/A;   LEFT HEART CATH AND CORS/GRAFTS ANGIOGRAPHY N/A 07/04/2022   Procedure: LEFT HEART CATH AND CORS/GRAFTS ANGIOGRAPHY;  Surgeon: Bathe Denyse LABOR, MD;  Location: [facility] INVASIVE CV LAB;  Service: Cardiovascular;  Laterality: N/A;   TEE WITHOUT CARDIOVERSION N/A 10/23/2017   Procedure: TRANSESOPHAGEAL ECHOCARDIOGRAM (TEE);  Surgeon: Army Dallas NOVAK, MD;  Location: [facility] OR;  Service: Open Heart Surgery;  Laterality: N/A;     Current Outpatient Medications  Medication Sig Dispense Refill   Accu-Chek Softclix Lancets lancets USE TO TEST SUGARS TWICE DAILY 200 each 3   acetaminophen  (TYLENOL ) 500 MG tablet Take 1,000 mg by mouth every 6 (six) hours as needed for mild  pain or headache.     aspirin  EC 81 MG tablet Take 81 mg by mouth daily.     atorvastatin  (LIPITOR ) 80 MG tablet TAKE 1 TABLET EVERY DAY 90 tablet 3   Blood Glucose Monitoring Suppl (ACCU-CHEK GUIDE ME) w/Device KIT      chlorthalidone (HYGROTON) 25 MG tablet TAKE 1 TABLET EVERY DAY 90 tablet 3   clopidogrel  (PLAVIX ) 75 MG tablet TAKE 1 TABLET EVERY DAY 90 tablet 3   Evolocumab (REPATHA SURECLICK) 140 MG/ML SOAJ Inject 140 mg into the skin every 14 (fourteen)  days. 2 mL 2   fluticasone  (FLONASE ) 50 MCG/ACT nasal spray USE 2 SPRAYS IN EACH NOSTRIL EVERY DAY 48 g 3   glucose blood (ACCU-CHEK GUIDE) test strip Blood Glucose Test In Vitro Strip QTY: 100 strip Days: 90 Refills: 3  Written: 06/07/19 Patient Instructions: E11.9 - to check blodd sugars once daily     isosorbide  mononitrate (IMDUR ) 30 MG 24 hr tablet TAKE 1 TABLET EVERY DAY 90 tablet 3   losartan  (COZAAR ) 25 MG tablet TAKE 1 TABLET EVERY DAY 90 tablet 3   methocarbamol  (ROBAXIN ) 500 MG tablet Take 500 mg by mouth at bedtime as needed for muscle spasms.     metoprolol  succinate (TOPROL  XL) 25 MG 24 hr tablet Take 1 tablet (25 mg total) by mouth daily. 30 tablet 11   nitroGLYCERIN  (NITROSTAT ) 0.4 MG SL tablet Place 1 tablet (0.4 mg total) under the tongue every 5 (five) minutes x 3 doses as needed for chest pain. 25 tablet 0   ondansetron  (ZOFRAN -ODT) 4 MG disintegrating tablet DISSOLVE 1 TABLET IN MOUTH UP TO EVERY 8 HOURS AS NEEDED FOR NAUSEA 90 tablet 3   pantoprazole  (PROTONIX ) 40 MG tablet TAKE 1 TABLET BY MOUTH EVERY MORNING 30 MINS BEFORE FOOD 90 tablet 3   potassium chloride  SA (KLOR-CON  M) 20 MEQ tablet Take 2 tablets (40 mEq total) by mouth daily. 180 tablet 0   ranolazine (RANEXA) 500 MG 12 hr tablet TAKE 1 TABLET TWICE DAILY 180 tablet 3   sucralfate  (CARAFATE ) 1 g tablet TAKE 1 TABLET BY MOUTH 4 TIMES DAILY. 360 tablet 1   No current facility-administered medications for this visit.    Allergies:   Lisinopril, Meloxicam , Tramadol, Amoxicillin, Amoxicillin-pot clavulanate, and Clavulanic acid    Social History:   reports that she quit smoking about 26 years ago. Her smoking use included cigarettes. She has never used smokeless tobacco. She reports that she does not drink alcohol and does not use drugs.   Family History:  family history includes COPD in her brother; Cirrhosis in her father; Coronary artery disease in her brother, father, and sister; Diabetes type II in her father;  Heart failure in her mother; Hypertension in her father; Kidney disease in her father; Pulmonary embolism in her mother; Stroke in her sister.    ROS:     Review of Systems  Constitutional: Negative.   HENT: Negative.    Eyes: Negative.   Respiratory: Negative.    Gastrointestinal: Negative.   Genitourinary: Negative.   Musculoskeletal: Negative.   Skin: Negative.   Neurological: Negative.   Endo/Heme/Allergies: Negative.   Psychiatric/Behavioral: Negative.    All other systems reviewed and are negative.     All other systems are reviewed and negative.    PHYSICAL EXAM: VS:  BP 119/77   Pulse 64   Ht 5' 3 (1.6 m)   Wt 139 lb 3.2 oz (63.1 kg)   SpO2 99%   BMI  24.66 kg/m  , BMI Body mass index is 24.66 kg/m. Last weight:  Wt Readings from Last 3 Encounters:  09/19/24 139 lb 3.2 oz (63.1 kg)  09/12/24 138 lb 3.2 oz (62.7 kg)  08/19/24 136 lb 9.6 oz (62 kg)     Physical Exam Constitutional:      Appearance: Normal appearance.  Cardiovascular:     Rate and Rhythm: Normal rate and regular rhythm.     Heart sounds: Normal heart sounds.  Pulmonary:     Effort: Pulmonary effort is normal.     Breath sounds: Normal breath sounds.  Musculoskeletal:     Right lower leg: No edema.     Left lower leg: No edema.  Neurological:     Mental Status: She is alert.       EKG:   Recent Labs: 09/12/2024: ALT 9; BUN 13; Creatinine, Ser 1.10; Hemoglobin 12.9; Platelets 279; Potassium 3.9; Sodium 140; TSH 0.898    Lipid Panel    Component Value Date/Time   CHOL 145 09/12/2024 1022   TRIG 53 09/12/2024 1022   HDL 55 09/12/2024 1022   CHOLHDL 2.6 09/12/2024 1022   CHOLHDL 4.0 07/04/2022 0623   VLDL 14 07/04/2022 0623   LDLCALC 79 09/12/2024 1022      Other studies Reviewed: Additional studies/ records that were reviewed today include:  Review of the above records demonstrates:       No data to display            ASSESSMENT AND PLAN:    ICD-10-CM   1.  Benign essential hypertension  I10 Evolocumab (REPATHA SURECLICK) 140 MG/ML SOAJ    2. Chronic diastolic (congestive) heart failure (HCC)  I50.32 Evolocumab (REPATHA SURECLICK) 140 MG/ML SOAJ    3. Coronary artery disease of native artery of native heart with stable angina pectoris  I25.118 Evolocumab (REPATHA SURECLICK) 140 MG/ML SOAJ    4. Coronary graft stenosis, subsequent encounter  T82.857D Evolocumab (REPATHA SURECLICK) 140 MG/ML SOAJ    5. Peripheral vascular disease  I73.9 Evolocumab (REPATHA SURECLICK) 140 MG/ML SOAJ    6. Peptic ulcer disease  K27.9 Evolocumab (REPATHA SURECLICK) 140 MG/ML SOAJ    7. CVA, old, hemiparesis (HCC)  I69.359 Evolocumab (REPATHA SURECLICK) 140 MG/ML SOAJ    8. Mixed hyperlipidemia  E78.2 Evolocumab (REPATHA SURECLICK) 140 MG/ML SOAJ   Had CABG and stroke at young age with LDL still 79, and high risk as on lipitor  80 already, and unable to take crestor. Need repatha , guidline is LDL less 55.       Problem List Items Addressed This Visit       Cardiovascular and Mediastinum   Benign essential hypertension - Primary   Relevant Medications   Evolocumab (REPATHA SURECLICK) 140 MG/ML SOAJ   Coronary artery disease of native artery of native heart with stable angina pectoris   Relevant Medications   Evolocumab (REPATHA SURECLICK) 140 MG/ML SOAJ   Coronary graft stenosis   Relevant Medications   Evolocumab (REPATHA SURECLICK) 140 MG/ML SOAJ   Peripheral vascular disease   Relevant Medications   Evolocumab (REPATHA SURECLICK) 140 MG/ML SOAJ   Chronic diastolic (congestive) heart failure (HCC)   Relevant Medications   Evolocumab (REPATHA SURECLICK) 140 MG/ML SOAJ     Digestive   Peptic ulcer disease   Relevant Medications   Evolocumab (REPATHA SURECLICK) 140 MG/ML SOAJ     Nervous and Auditory   CVA, old, hemiparesis (HCC)   Relevant Medications   Evolocumab (REPATHA SURECLICK)  140 MG/ML SOAJ     Other   Mixed hyperlipidemia   Relevant  Medications   Evolocumab (REPATHA SURECLICK) 140 MG/ML SOAJ       Disposition:   Return in about 4 weeks (around 10/17/2024).    Total time spent: 35 minutes  Signed,  Denyse Bathe, MD  09/19/2024 9:24 AM    [facility]

## 2024-09-23 ENCOUNTER — Telehealth: Payer: Self-pay | Admitting: Family

## 2024-09-23 NOTE — Telephone Encounter (Signed)
 Patient left VM stating that [employer] told her that her kidney function is low but Alan told her everything looks fine. She wants to know what her kidney function levels are and how are they looking? Please advise.

## 2024-09-25 ENCOUNTER — Telehealth: Payer: Self-pay

## 2024-09-25 NOTE — Telephone Encounter (Signed)
 Pt states her lymph nodes around her neck are all swollen. Pt is asking what she should do.

## 2024-09-27 ENCOUNTER — Other Ambulatory Visit: Payer: Self-pay

## 2024-09-27 ENCOUNTER — Emergency Department

## 2024-09-27 ENCOUNTER — Emergency Department
Admission: EM | Admit: 2024-09-27 | Discharge: 2024-09-27 | Disposition: A | Discharge status: Home / Self Care | Attending: Emergency Medicine | Admitting: Emergency Medicine

## 2024-09-27 DIAGNOSIS — R0789 Other chest pain: Secondary | ICD-10-CM | POA: Insufficient documentation

## 2024-09-27 DIAGNOSIS — Z951 Presence of aortocoronary bypass graft: Secondary | ICD-10-CM | POA: Diagnosis not present

## 2024-09-27 DIAGNOSIS — E119 Type 2 diabetes mellitus without complications: Secondary | ICD-10-CM | POA: Diagnosis not present

## 2024-09-27 DIAGNOSIS — I5030 Unspecified diastolic (congestive) heart failure: Secondary | ICD-10-CM | POA: Diagnosis not present

## 2024-09-27 DIAGNOSIS — M79602 Pain in left arm: Secondary | ICD-10-CM | POA: Diagnosis not present

## 2024-09-27 DIAGNOSIS — I110 Hypertensive heart disease with heart failure: Secondary | ICD-10-CM | POA: Diagnosis not present

## 2024-09-27 DIAGNOSIS — R42 Dizziness and giddiness: Secondary | ICD-10-CM | POA: Diagnosis not present

## 2024-09-27 DIAGNOSIS — I2510 Atherosclerotic heart disease of native coronary artery without angina pectoris: Secondary | ICD-10-CM | POA: Insufficient documentation

## 2024-09-27 DIAGNOSIS — R072 Precordial pain: Secondary | ICD-10-CM | POA: Diagnosis not present

## 2024-09-27 DIAGNOSIS — R079 Chest pain, unspecified: Secondary | ICD-10-CM

## 2024-09-27 DIAGNOSIS — M47812 Spondylosis without myelopathy or radiculopathy, cervical region: Secondary | ICD-10-CM | POA: Diagnosis not present

## 2024-09-27 LAB — BASIC METABOLIC PANEL WITH GFR
Anion gap: 13 (ref 5–15)
BUN: 18 mg/dL (ref 8–23)
CO2: 25 mmol/L (ref 22–32)
Calcium: 9.2 mg/dL (ref 8.9–10.3)
Chloride: 100 mmol/L (ref 98–111)
Creatinine, Ser: 1.04 mg/dL — ABNORMAL HIGH (ref 0.44–1.00)
GFR, Estimated: >60 mL/min (ref >60)
Glucose, Bld: 113 mg/dL — ABNORMAL HIGH (ref 70–99)
Potassium: 3.7 mmol/L (ref 3.5–5.1)
Sodium: 138 mmol/L (ref 135–145)

## 2024-09-27 LAB — CBC
HCT: 36.9 % (ref 36.0–46.0)
Hemoglobin: 12.4 g/dL (ref 12.0–15.0)
MCH: 28.4 pg (ref 26.0–34.0)
MCHC: 33.6 g/dL (ref 30.0–36.0)
MCV: 84.6 fL (ref 80.0–100.0)
Platelets: 318 K/uL (ref 150–400)
RBC: 4.36 MIL/uL (ref 3.87–5.11)
RDW: 13.2 % (ref 11.5–15.5)
WBC: 3.9 K/uL — ABNORMAL LOW (ref 4.0–10.5)
nRBC: 0 % (ref 0.0–0.2)

## 2024-09-27 LAB — TROPONIN I (HIGH SENSITIVITY): Troponin I (High Sensitivity): 15 ng/L (ref <18)

## 2024-09-27 MED ORDER — LACTATED RINGERS IV BOLUS
1000.0000 mL | Freq: Once | INTRAVENOUS | Status: AC
  Administered 2024-09-27: 1000 mL via INTRAVENOUS

## 2024-09-27 NOTE — ED Triage Notes (Addendum)
 Pt comes with mid sternal cp and pain radiates down her left  arm. Pt states stiffness in neck. Pt states pain is getting worse. Pt states she is on thinner. Pt states dizziness also.   Pt has hx of bypass

## 2024-09-27 NOTE — ED Provider Notes (Signed)
 [facility] Provider Note    Event Date/Time   First MD Initiated Contact with Patient 09/27/24 1415     (approximate)   History   Chief Complaint Chest Pain   HPI  Kayla Malone is a 63 y.o. female with past medical history of hypertension, diabetes, CAD status post CABG, HFpEF, and stroke who presents to the ED complaining of chest pain.  Patient reports that she has had 3 days of pain that started on the left side of her neck anteriorly and has started to move down into her chest as well as her right arm.  Pain is described as constant and a dull ache.  She has been feeling dizzy and lightheaded at times, but denies any sensation of the room spinning around her and has not had any numbness or weakness.  She denies any fevers, cough, or difficulty breathing.  She has not noticed any pain or swelling in her legs.     Physical Exam   Triage Vital Signs: ED Triage Vitals  Encounter Vitals Group     BP 09/27/24 1331 130/74     Girls Systolic BP Percentile --      Girls Diastolic BP Percentile --      Boys Systolic BP Percentile --      Boys Diastolic BP Percentile --      Pulse Rate 09/27/24 1331 60     Resp 09/27/24 1331 18     Temp 09/27/24 1331 98.9 F (37.2 C)     Temp src --      SpO2 09/27/24 1331 100 %     Weight 09/27/24 1329 129 lb (58.5 kg)     Height 09/27/24 1329 5' 3 (1.6 m)     Head Circumference --      Peak Flow --      Pain Score 09/27/24 1329 6     Pain Loc --      Pain Education --      Exclude from Growth Chart --     Most recent vital signs: Vitals:   09/27/24 1331  BP: 130/74  Pulse: 60  Resp: 18  Temp: 98.9 F (37.2 C)  SpO2: 100%    Constitutional: Alert and oriented. Eyes: Conjunctivae are normal. Head: Atraumatic. Nose: No congestion/rhinnorhea. Mouth/Throat: Mucous membranes are moist.  Neck: No midline cervical spine tenderness to palpation or anterior neck tenderness to  palpation. Cardiovascular: Normal rate, regular rhythm. Grossly normal heart sounds.  2+ radial pulses bilaterally. Respiratory: Normal respiratory effort.  No retractions. Lungs CTAB.  No chest wall tenderness to palpation. Gastrointestinal: Soft and nontender. No distention. Musculoskeletal: No lower extremity tenderness nor edema.  Neurologic:  Normal speech and language. No gross focal neurologic deficits are appreciated.    ED Results / Procedures / Treatments   Labs (all labs ordered are listed, but only abnormal results are displayed) Labs Reviewed  BASIC METABOLIC PANEL WITH GFR - Abnormal; Notable for the following components:      Result Value   Glucose, Bld 113 (*)    Creatinine, Ser 1.04 (*)    All other components within normal limits  CBC - Abnormal; Notable for the following components:   WBC 3.9 (*)    All other components within normal limits  TROPONIN I (HIGH SENSITIVITY)     EKG  ED ECG REPORT I, Carlin Palin, the attending physician, personally viewed and interpreted this ECG.   Date: 09/27/2024  EKG Time: 13:34  Rate:  55  Rhythm: sinus bradycardia  Axis: Normal  Intervals:none  ST&T Change: None  RADIOLOGY Chest x-ray reviewed and interpreted by me with no infiltrate, edema, or effusion.  PROCEDURES:  Critical Care performed: No  Procedures   MEDICATIONS ORDERED IN ED: Medications  lactated ringers  bolus 1,000 mL (1,000 mLs Intravenous New Bag/Given 09/27/24 1445)     IMPRESSION / MDM / ASSESSMENT AND PLAN / ED COURSE  I reviewed the triage vital signs and the nursing notes.                              63 y.o. female with past medical history of hypertension, diabetes, CAD status post CABG, HFpEF, and stroke who presents to the ED complaining of 3 days of worsening pain starting in the left side of her neck and radiating across her chest and into her right arm.  Patient's presentation is most consistent with acute presentation with  potential threat to life or bodily function.  Differential diagnosis includes, but is not limited to, ACS, PE, pneumonia, pneumothorax, musculoskeletal pain, radiculopathy, carotid or vertebral artery dissection.  Patient nontoxic-appearing and in no acute distress, vital signs are unremarkable.  EKG shows no evidence of arrhythmia or ischemia and troponin within normal limits, doubt ACS.  Additional labs without significant anemia, leukocytosis, electrolyte abnormality, or AKI.  Chest x-ray results are pending, will also check CT of cervical spine given symptoms consistent with a radiculopathy.  Dizziness described as a lightheadedness, no other neurologic complaints or focal findings on exam to suggest vertebral or carotid dissection.  We will hydrate with IV fluids and reassess following imaging results.  Chest x-ray is unremarkable, dizziness improving following IV fluids.  CT cervical spine is pending at this time, if this is unremarkable then patient would be appropriate for discharge home.  Patient turned over to oncoming provider pending results.      FINAL CLINICAL IMPRESSION(S) / ED DIAGNOSES   Final diagnoses:  Nonspecific chest pain     Rx / DC Orders   ED Discharge Orders     None        Note:  This document was prepared using Dragon voice recognition software and may include unintentional dictation errors.   [name], MD 09/27/24 [phone]

## 2024-09-27 NOTE — ED Provider Notes (Signed)
-----------------------------------------   3:36 PM on 09/27/2024 -----------------------------------------  CT cervical spine is resulted and shows no acute findings.  The patient is stable for discharge.  Return precautions have been provided.   Jacolyn Pae, MD 09/27/24 1536

## 2024-10-01 DIAGNOSIS — I422 Other hypertrophic cardiomyopathy: Secondary | ICD-10-CM | POA: Diagnosis not present

## 2024-10-01 DIAGNOSIS — Z79899 Other long term (current) drug therapy: Secondary | ICD-10-CM | POA: Diagnosis not present

## 2024-10-01 DIAGNOSIS — M519 Unspecified thoracic, thoracolumbar and lumbosacral intervertebral disc disorder: Secondary | ICD-10-CM | POA: Diagnosis not present

## 2024-10-01 DIAGNOSIS — J45909 Unspecified asthma, uncomplicated: Secondary | ICD-10-CM | POA: Diagnosis not present

## 2024-10-01 DIAGNOSIS — E876 Hypokalemia: Secondary | ICD-10-CM | POA: Diagnosis not present

## 2024-10-01 DIAGNOSIS — R32 Unspecified urinary incontinence: Secondary | ICD-10-CM | POA: Diagnosis not present

## 2024-10-01 DIAGNOSIS — Z87891 Personal history of nicotine dependence: Secondary | ICD-10-CM | POA: Diagnosis not present

## 2024-10-01 DIAGNOSIS — I69354 Hemiplegia and hemiparesis following cerebral infarction affecting left non-dominant side: Secondary | ICD-10-CM | POA: Diagnosis not present

## 2024-10-01 DIAGNOSIS — Z7982 Long term (current) use of aspirin: Secondary | ICD-10-CM | POA: Diagnosis not present

## 2024-10-01 DIAGNOSIS — I272 Pulmonary hypertension, unspecified: Secondary | ICD-10-CM | POA: Diagnosis not present

## 2024-10-01 DIAGNOSIS — I252 Old myocardial infarction: Secondary | ICD-10-CM | POA: Diagnosis not present

## 2024-10-01 DIAGNOSIS — Z9989 Dependence on other enabling machines and devices: Secondary | ICD-10-CM | POA: Diagnosis not present

## 2024-10-01 DIAGNOSIS — M545 Low back pain, unspecified: Secondary | ICD-10-CM | POA: Diagnosis not present

## 2024-10-01 DIAGNOSIS — Z833 Family history of diabetes mellitus: Secondary | ICD-10-CM | POA: Diagnosis not present

## 2024-10-01 DIAGNOSIS — I509 Heart failure, unspecified: Secondary | ICD-10-CM | POA: Diagnosis not present

## 2024-10-01 DIAGNOSIS — E1122 Type 2 diabetes mellitus with diabetic chronic kidney disease: Secondary | ICD-10-CM | POA: Diagnosis not present

## 2024-10-01 DIAGNOSIS — K760 Fatty (change of) liver, not elsewhere classified: Secondary | ICD-10-CM | POA: Diagnosis not present

## 2024-10-01 DIAGNOSIS — M8580 Other specified disorders of bone density and structure, unspecified site: Secondary | ICD-10-CM | POA: Diagnosis not present

## 2024-10-01 DIAGNOSIS — I69328 Other speech and language deficits following cerebral infarction: Secondary | ICD-10-CM | POA: Diagnosis not present

## 2024-10-01 DIAGNOSIS — E049 Nontoxic goiter, unspecified: Secondary | ICD-10-CM | POA: Diagnosis not present

## 2024-10-01 DIAGNOSIS — M4800 Spinal stenosis, site unspecified: Secondary | ICD-10-CM | POA: Diagnosis not present

## 2024-10-01 DIAGNOSIS — F324 Major depressive disorder, single episode, in partial remission: Secondary | ICD-10-CM | POA: Diagnosis not present

## 2024-10-01 DIAGNOSIS — K219 Gastro-esophageal reflux disease without esophagitis: Secondary | ICD-10-CM | POA: Diagnosis not present

## 2024-10-01 DIAGNOSIS — I25119 Atherosclerotic heart disease of native coronary artery with unspecified angina pectoris: Secondary | ICD-10-CM | POA: Diagnosis not present

## 2024-10-01 DIAGNOSIS — Z8249 Family history of ischemic heart disease and other diseases of the circulatory system: Secondary | ICD-10-CM | POA: Diagnosis not present

## 2024-10-01 DIAGNOSIS — I349 Nonrheumatic mitral valve disorder, unspecified: Secondary | ICD-10-CM | POA: Diagnosis not present

## 2024-10-01 DIAGNOSIS — N1831 Chronic kidney disease, stage 3a: Secondary | ICD-10-CM | POA: Diagnosis not present

## 2024-10-01 DIAGNOSIS — I379 Nonrheumatic pulmonary valve disorder, unspecified: Secondary | ICD-10-CM | POA: Diagnosis not present

## 2024-10-01 DIAGNOSIS — I130 Hypertensive heart and chronic kidney disease with heart failure and stage 1 through stage 4 chronic kidney disease, or unspecified chronic kidney disease: Secondary | ICD-10-CM | POA: Diagnosis not present

## 2024-10-01 DIAGNOSIS — E1151 Type 2 diabetes mellitus with diabetic peripheral angiopathy without gangrene: Secondary | ICD-10-CM | POA: Diagnosis not present

## 2024-10-10 DIAGNOSIS — G5603 Carpal tunnel syndrome, bilateral upper limbs: Secondary | ICD-10-CM | POA: Diagnosis not present

## 2024-10-15 DIAGNOSIS — M25441 Effusion, right hand: Secondary | ICD-10-CM | POA: Diagnosis not present

## 2024-10-15 DIAGNOSIS — M65341 Trigger finger, right ring finger: Secondary | ICD-10-CM | POA: Diagnosis not present

## 2024-10-21 ENCOUNTER — Encounter: Payer: Self-pay | Admitting: Cardiovascular Disease

## 2024-10-21 ENCOUNTER — Ambulatory Visit: Admitting: Cardiovascular Disease

## 2024-10-21 VITALS — BP 106/54 | HR 66 | Ht 63.0 in | Wt 140.6 lb

## 2024-10-21 DIAGNOSIS — K279 Peptic ulcer, site unspecified, unspecified as acute or chronic, without hemorrhage or perforation: Secondary | ICD-10-CM

## 2024-10-21 DIAGNOSIS — I10 Essential (primary) hypertension: Secondary | ICD-10-CM | POA: Diagnosis not present

## 2024-10-21 DIAGNOSIS — T82857D Stenosis of cardiac prosthetic devices, implants and grafts, subsequent encounter: Secondary | ICD-10-CM

## 2024-10-21 DIAGNOSIS — I25118 Atherosclerotic heart disease of native coronary artery with other forms of angina pectoris: Secondary | ICD-10-CM | POA: Diagnosis not present

## 2024-10-21 DIAGNOSIS — I739 Peripheral vascular disease, unspecified: Secondary | ICD-10-CM

## 2024-10-21 DIAGNOSIS — I69359 Hemiplegia and hemiparesis following cerebral infarction affecting unspecified side: Secondary | ICD-10-CM | POA: Diagnosis not present

## 2024-10-21 DIAGNOSIS — E782 Mixed hyperlipidemia: Secondary | ICD-10-CM | POA: Diagnosis not present

## 2024-10-21 DIAGNOSIS — I5032 Chronic diastolic (congestive) heart failure: Secondary | ICD-10-CM

## 2024-10-21 MED ORDER — REPATHA SURECLICK 140 MG/ML ~~LOC~~ SOAJ: 140.0000 mg | SUBCUTANEOUS | 2 refills | Status: AC

## 2024-10-21 NOTE — Progress Notes (Signed)
 Cardiology Office Note   Date:  10/21/2024   ID:  Kayla Malone, DOB 04-11-1961, MRN [id number]  PCP:  Orlean Alan HERO, FNP  Cardiologist:  Denyse Bathe, MD      History of Present Illness: Kayla Malone is a 63 y.o. female who presents for  Chief Complaint  Patient presents with   Follow-up    4 week follow up    Coughing, and SOB. Says has allergy.      Past Medical History:  Diagnosis Date   Adenomatous colon polyp 02/20/2014   Asthma    Benign neoplasm of colon 02/20/2014   Chest pain 09/13/2013   Coronary angioplasty status 11/13/2018   Coronary artery disease    Depression    Diabetes mellitus without complication (HCC)    GERD (gastroesophageal reflux disease)    History of hiatal hernia    Hypertension    Hypokalemia 05/29/2019   Hypomagnesemia 07/02/2022   Nausea with vomiting, unspecified 06/07/2019   NSTEMI (non-ST elevated myocardial infarction) (HCC) 09/23/2018   Pain    BACK   Stroke (HCC)    x3   Thyroid  cyst    TIA (transient ischemic attack) 08/06/2017   Unstable angina (HCC) 10/20/2017   Weight loss 06/07/2019     Past Surgical History:  Procedure Laterality Date   ABDOMINAL HYSTERECTOMY     CARDIAC SURGERY     CATARACT EXTRACTION W/PHACO Left 08/15/2017   Procedure: CATARACT EXTRACTION PHACO AND INTRAOCULAR LENS PLACEMENT (IOC);  Surgeon: Jaye Fallow, MD;  Location: [facility] ORS;  Service: Ophthalmology;  Laterality: Left;  US  00:40.6 AP% 7.3 CDE 2.98 FLUID PACK LOT # [id number]   CORONARY ARTERY BYPASS GRAFT N/A 10/23/2017   Procedure: CORONARY ARTERY BYPASS GRAFTING (CABG) x two  , using left internal mammary artery and right leg greater saphenous vein harvested  endoscopically - LIMA to LAD, SVG to RCA;  Surgeon: Army Dallas NOVAK, MD;  Location: [facility] OR;  Service: Open Heart Surgery;  Laterality: N/A;   CORONARY STENT INTERVENTION N/A 09/24/2018   Procedure: CORONARY STENT INTERVENTION;  Surgeon: Dann Candyce RAMAN, MD;  Location: [facility] INVASIVE CV LAB;  Service: Cardiovascular;  Laterality: N/A;   ESOPHAGOGASTRODUODENOSCOPY N/A 05/30/2019   Procedure: ESOPHAGOGASTRODUODENOSCOPY (EGD);  Surgeon: Toledo, Ladell POUR, MD;  Location: [facility] ENDOSCOPY;  Service: Gastroenterology;  Laterality: N/A;   EYE SURGERY     LEFT HEART CATH AND CORONARY ANGIOGRAPHY Left 10/09/2017   Procedure: LEFT HEART CATH AND CORONARY ANGIOGRAPHY;  Surgeon: Bathe Denyse LABOR, MD;  Location: [facility] INVASIVE CV LAB;  Service: Cardiovascular;  Laterality: Left;   LEFT HEART CATH AND CORS/GRAFTS ANGIOGRAPHY N/A 09/24/2018   Procedure: LEFT HEART CATH AND CORS/GRAFTS ANGIOGRAPHY;  Surgeon: Dann Candyce RAMAN, MD;  Location: [facility] INVASIVE CV LAB;  Service: Cardiovascular;  Laterality: N/A;   LEFT HEART CATH AND CORS/GRAFTS ANGIOGRAPHY N/A 07/04/2022   Procedure: LEFT HEART CATH AND CORS/GRAFTS ANGIOGRAPHY;  Surgeon: Bathe Denyse LABOR, MD;  Location: [facility] INVASIVE CV LAB;  Service: Cardiovascular;  Laterality: N/A;   TEE WITHOUT CARDIOVERSION N/A 10/23/2017   Procedure: TRANSESOPHAGEAL ECHOCARDIOGRAM (TEE);  Surgeon: Army Dallas NOVAK, MD;  Location: [facility] OR;  Service: Open Heart Surgery;  Laterality: N/A;     Current Outpatient Medications  Medication Sig Dispense Refill   Accu-Chek Softclix Lancets lancets USE TO TEST SUGARS TWICE DAILY 200 each 3   acetaminophen  (TYLENOL ) 500 MG tablet Take 1,000 mg by mouth every 6 (six) hours as needed for mild pain or headache.  aspirin  EC 81 MG tablet Take 81 mg by mouth daily.     atorvastatin  (LIPITOR ) 80 MG tablet TAKE 1 TABLET EVERY DAY 90 tablet 3   Blood Glucose Monitoring Suppl (ACCU-CHEK GUIDE ME) w/Device KIT      chlorthalidone (HYGROTON) 25 MG tablet TAKE 1 TABLET EVERY DAY 90 tablet 3   clopidogrel  (PLAVIX ) 75 MG tablet TAKE 1 TABLET EVERY DAY 90 tablet 3   fluticasone  (FLONASE ) 50 MCG/ACT nasal spray USE 2 SPRAYS IN EACH NOSTRIL EVERY DAY 48 g 3   glucose blood (ACCU-CHEK GUIDE) test  strip Blood Glucose Test In Vitro Strip QTY: 100 strip Days: 90 Refills: 3  Written: 06/07/19 Patient Instructions: E11.9 - to check blodd sugars once daily     isosorbide  mononitrate (IMDUR ) 30 MG 24 hr tablet TAKE 1 TABLET EVERY DAY 90 tablet 3   losartan  (COZAAR ) 25 MG tablet TAKE 1 TABLET EVERY DAY 90 tablet 3   methocarbamol  (ROBAXIN ) 500 MG tablet Take 500 mg by mouth at bedtime as needed for muscle spasms.     metoprolol  succinate (TOPROL  XL) 25 MG 24 hr tablet Take 1 tablet (25 mg total) by mouth daily. 30 tablet 11   nitroGLYCERIN  (NITROSTAT ) 0.4 MG SL tablet Place 1 tablet (0.4 mg total) under the tongue every 5 (five) minutes x 3 doses as needed for chest pain. 25 tablet 0   ondansetron  (ZOFRAN -ODT) 4 MG disintegrating tablet DISSOLVE 1 TABLET IN MOUTH UP TO EVERY 8 HOURS AS NEEDED FOR NAUSEA 90 tablet 3   pantoprazole  (PROTONIX ) 40 MG tablet TAKE 1 TABLET BY MOUTH EVERY MORNING 30 MINS BEFORE FOOD 90 tablet 3   potassium chloride  SA (KLOR-CON  M) 20 MEQ tablet Take 2 tablets (40 mEq total) by mouth daily. 180 tablet 0   ranolazine (RANEXA) 500 MG 12 hr tablet TAKE 1 TABLET TWICE DAILY 180 tablet 3   sucralfate  (CARAFATE ) 1 g tablet TAKE 1 TABLET BY MOUTH 4 TIMES DAILY. 360 tablet 1   Evolocumab (REPATHA SURECLICK) 140 MG/ML SOAJ Inject 140 mg into the skin every 14 (fourteen) days. 2 mL 2   No current facility-administered medications for this visit.    Allergies:   Lisinopril, Meloxicam , Tramadol, Amoxicillin, Amoxicillin-pot clavulanate, and Clavulanic acid    Social History:   reports that she quit smoking about 26 years ago. Her smoking use included cigarettes. She has never used smokeless tobacco. She reports that she does not drink alcohol and does not use drugs.   Family History:  family history includes COPD in her brother; Cirrhosis in her father; Coronary artery disease in her brother, father, and sister; Diabetes type II in her father; Heart failure in her mother;  Hypertension in her father; Kidney disease in her father; Pulmonary embolism in her mother; Stroke in her sister.    ROS:     Review of Systems  Constitutional: Negative.   HENT: Negative.    Eyes: Negative.   Respiratory: Negative.    Gastrointestinal: Negative.   Genitourinary: Negative.   Musculoskeletal: Negative.   Skin: Negative.   Neurological: Negative.   Endo/Heme/Allergies: Negative.   Psychiatric/Behavioral: Negative.    All other systems reviewed and are negative.     All other systems are reviewed and negative.    PHYSICAL EXAM: VS:  BP (!) 106/54   Pulse 66   Ht 5' 3 (1.6 m)   Wt 140 lb 9.6 oz (63.8 kg)   SpO2 99%   BMI 24.91 kg/m  , BMI Body  mass index is 24.91 kg/m. Last weight:  Wt Readings from Last 3 Encounters:  10/21/24 140 lb 9.6 oz (63.8 kg)  09/27/24 129 lb (58.5 kg)  09/19/24 139 lb 3.2 oz (63.1 kg)     Physical Exam Constitutional:      Appearance: Normal appearance.  Cardiovascular:     Rate and Rhythm: Normal rate and regular rhythm.     Heart sounds: Normal heart sounds.  Pulmonary:     Effort: Pulmonary effort is normal.     Breath sounds: Normal breath sounds.  Musculoskeletal:     Right lower leg: No edema.     Left lower leg: No edema.  Neurological:     Mental Status: She is alert.       EKG:   Recent Labs: 09/12/2024: ALT 9; TSH 0.898 09/27/2024: BUN 18; Creatinine, Ser 1.04; Hemoglobin 12.4; Platelets 318; Potassium 3.7; Sodium 138    Lipid Panel    Component Value Date/Time   CHOL 145 09/12/2024 1022   TRIG 53 09/12/2024 1022   HDL 55 09/12/2024 1022   CHOLHDL 2.6 09/12/2024 1022   CHOLHDL 4.0 07/04/2022 0623   VLDL 14 07/04/2022 0623   LDLCALC 79 09/12/2024 1022      Other studies Reviewed: Additional studies/ records that were reviewed today include:  Review of the above records demonstrates:       No data to display            ASSESSMENT AND PLAN:    ICD-10-CM   1. Mixed  hyperlipidemia  E78.2 Evolocumab (REPATHA SURECLICK) 140 MG/ML SOAJ   Had CABG and stroke at young age with LDL still 44, and high risk as on lipitor  80 already, and unable to take crestor. Need repatha , guidline is LDL less 55.    2. Benign essential hypertension  I10 Evolocumab (REPATHA SURECLICK) 140 MG/ML SOAJ    3. Chronic diastolic (congestive) heart failure (HCC)  I50.32 Evolocumab (REPATHA SURECLICK) 140 MG/ML SOAJ    4. Coronary artery disease of native artery of native heart with stable angina pectoris  I25.118 Evolocumab (REPATHA SURECLICK) 140 MG/ML SOAJ   sent repatha injctions.    5. Coronary graft stenosis, subsequent encounter  T82.857D Evolocumab (REPATHA SURECLICK) 140 MG/ML SOAJ    6. Peripheral vascular disease  I73.9 Evolocumab (REPATHA SURECLICK) 140 MG/ML SOAJ    7. Peptic ulcer disease  K27.9 Evolocumab (REPATHA SURECLICK) 140 MG/ML SOAJ    8. CVA, old, hemiparesis (HCC)  I69.359 Evolocumab (REPATHA SURECLICK) 140 MG/ML SOAJ       Problem List Items Addressed This Visit       Cardiovascular and Mediastinum   Benign essential hypertension   Relevant Medications   Evolocumab (REPATHA SURECLICK) 140 MG/ML SOAJ   Coronary artery disease of native artery of native heart with stable angina pectoris   Relevant Medications   Evolocumab (REPATHA SURECLICK) 140 MG/ML SOAJ   Coronary graft stenosis   Relevant Medications   Evolocumab (REPATHA SURECLICK) 140 MG/ML SOAJ   Peripheral vascular disease   Relevant Medications   Evolocumab (REPATHA SURECLICK) 140 MG/ML SOAJ   Chronic diastolic (congestive) heart failure (HCC)   Relevant Medications   Evolocumab (REPATHA SURECLICK) 140 MG/ML SOAJ     Digestive   Peptic ulcer disease   Relevant Medications   Evolocumab (REPATHA SURECLICK) 140 MG/ML SOAJ     Nervous and Auditory   CVA, old, hemiparesis (HCC)   Relevant Medications   Evolocumab (REPATHA SURECLICK) 140 MG/ML SOAJ  Other   Mixed hyperlipidemia -  Primary   Relevant Medications   Evolocumab (REPATHA SURECLICK) 140 MG/ML SOAJ       Disposition:   No follow-ups on file.    Total time spent: 35 minutes  Signed,  Denyse Bathe, MD  10/21/2024 11:03 AM    [facility]

## 2024-10-31 ENCOUNTER — Ambulatory Visit (INDEPENDENT_AMBULATORY_CARE_PROVIDER_SITE_OTHER): Admitting: Podiatry

## 2024-10-31 DIAGNOSIS — L600 Ingrowing nail: Secondary | ICD-10-CM

## 2024-10-31 NOTE — Progress Notes (Signed)
 Subjective:  Patient ID: Kayla Malone, female    DOB: July 11, 1961,  MRN: [id number]  Chief Complaint  Patient presents with   Numbness    63 y.o. female presents with the above complaint.  Patient presents with left hallux medial border ingrown painful to touch is progressive gotten worse worse with ambulation worse with pressure patient would like to have it removed has not seen anyone as part of same he denies any other acute complaints.   Review of Systems: Negative except as noted in the HPI. Denies N/V/F/Ch.  Past Medical History:  Diagnosis Date   Adenomatous colon polyp 02/20/2014   Asthma    Benign neoplasm of colon 02/20/2014   Chest pain 09/13/2013   Coronary angioplasty status 11/13/2018   Coronary artery disease    Depression    Diabetes mellitus without complication (HCC)    GERD (gastroesophageal reflux disease)    History of hiatal hernia    Hypertension    Hypokalemia 05/29/2019   Hypomagnesemia 07/02/2022   Nausea with vomiting, unspecified 06/07/2019   NSTEMI (non-ST elevated myocardial infarction) (HCC) 09/23/2018   Pain    BACK   Stroke (HCC)    x3   Thyroid  cyst    TIA (transient ischemic attack) 08/06/2017   Unstable angina (HCC) 10/20/2017   Weight loss 06/07/2019    Current Outpatient Medications:    Accu-Chek Softclix Lancets lancets, USE TO TEST SUGARS TWICE DAILY, Disp: 200 each, Rfl: 3   acetaminophen  (TYLENOL ) 500 MG tablet, Take 1,000 mg by mouth every 6 (six) hours as needed for mild pain or headache., Disp: , Rfl:    aspirin  EC 81 MG tablet, Take 81 mg by mouth daily., Disp: , Rfl:    atorvastatin  (LIPITOR ) 80 MG tablet, TAKE 1 TABLET EVERY DAY, Disp: 90 tablet, Rfl: 3   Blood Glucose Monitoring Suppl (ACCU-CHEK GUIDE ME) w/Device KIT, , Disp: , Rfl:    chlorthalidone (HYGROTON) 25 MG tablet, TAKE 1 TABLET EVERY DAY, Disp: 90 tablet, Rfl: 3   clopidogrel  (PLAVIX ) 75 MG tablet, TAKE 1 TABLET EVERY DAY, Disp: 90 tablet, Rfl: 3    Evolocumab (REPATHA SURECLICK) 140 MG/ML SOAJ, Inject 140 mg into the skin every 14 (fourteen) days., Disp: 2 mL, Rfl: 2   fluticasone  (FLONASE ) 50 MCG/ACT nasal spray, USE 2 SPRAYS IN EACH NOSTRIL EVERY DAY, Disp: 48 g, Rfl: 3   glucose blood (ACCU-CHEK GUIDE) test strip, Blood Glucose Test In Vitro Strip QTY: 100 strip Days: 90 Refills: 3  Written: 06/07/19 Patient Instructions: E11.9 - to check blodd sugars once daily, Disp: , Rfl:    isosorbide  mononitrate (IMDUR ) 30 MG 24 hr tablet, TAKE 1 TABLET EVERY DAY, Disp: 90 tablet, Rfl: 3   losartan  (COZAAR ) 25 MG tablet, TAKE 1 TABLET EVERY DAY, Disp: 90 tablet, Rfl: 3   methocarbamol  (ROBAXIN ) 500 MG tablet, Take 500 mg by mouth at bedtime as needed for muscle spasms., Disp: , Rfl:    metoprolol  succinate (TOPROL  XL) 25 MG 24 hr tablet, Take 1 tablet (25 mg total) by mouth daily., Disp: 30 tablet, Rfl: 11   nitroGLYCERIN  (NITROSTAT ) 0.4 MG SL tablet, Place 1 tablet (0.4 mg total) under the tongue every 5 (five) minutes x 3 doses as needed for chest pain., Disp: 25 tablet, Rfl: 0   ondansetron  (ZOFRAN -ODT) 4 MG disintegrating tablet, DISSOLVE 1 TABLET IN MOUTH UP TO EVERY 8 HOURS AS NEEDED FOR NAUSEA, Disp: 90 tablet, Rfl: 3   pantoprazole  (PROTONIX ) 40 MG tablet, TAKE 1  TABLET BY MOUTH EVERY MORNING 30 MINS BEFORE FOOD, Disp: 90 tablet, Rfl: 3   potassium chloride  SA (KLOR-CON  M) 20 MEQ tablet, Take 2 tablets (40 mEq total) by mouth daily., Disp: 180 tablet, Rfl: 0   ranolazine (RANEXA) 500 MG 12 hr tablet, TAKE 1 TABLET TWICE DAILY, Disp: 180 tablet, Rfl: 3   sucralfate  (CARAFATE ) 1 g tablet, TAKE 1 TABLET BY MOUTH 4 TIMES DAILY., Disp: 360 tablet, Rfl: 1  Social History   Tobacco Use  Smoking Status Former   Current packs/day: 0.00   Types: Cigarettes   Quit date: 09/13/1998   Years since quitting: 26.1  Smokeless Tobacco Never    Allergies  Allergen Reactions   Lisinopril Swelling and Shortness Of Breath   Meloxicam  Nausea Only and  Nausea And Vomiting   Tramadol Nausea Only and Other (See Comments)    sleepiness  Other reaction(s): Dizziness, Other, Other (See Comments)  somnolence  Other reaction(s): Other (See Comments), Other (See Comments)  Would not wake up after taking this medication   Amoxicillin Rash and Dermatitis    Don't remember   Don't remember   Amoxicillin-Pot Clavulanate Hives, Rash and Dermatitis   Clavulanic Acid Rash   Objective:  There were no vitals filed for this visit. There is no height or weight on file to calculate BMI. Constitutional Well developed. Well nourished.  Vascular Dorsalis pedis pulses palpable bilaterally. Posterior tibial pulses palpable bilaterally. Capillary refill normal to all digits.  No cyanosis or clubbing noted. Pedal hair growth normal.  Neurologic Normal speech. Oriented to person, place, and time. Epicritic sensation to light touch grossly present bilaterally.  Dermatologic Painful ingrowing nail at medial nail borders of the hallux nail left. No other open wounds. No skin lesions.  Orthopedic: Normal joint ROM without pain or crepitus bilaterally. No visible deformities. No bony tenderness.   Radiographs: None Assessment:   1. Ingrown left big toenail    Plan:  Patient was evaluated and treated and all questions answered.  Ingrown Nail, left -Patient elects to proceed with minor surgery to remove ingrown toenail removal today. Consent reviewed and signed by patient. -Ingrown nail excised. See procedure note. -Educated on post-procedure care including soaking. Written instructions provided and reviewed. -Patient to follow up in 2 weeks for nail check.  Procedure: Excision of Ingrown Toenail Location: Left 1st toe medial nail borders. Anesthesia: Lidocaine  1% plain; 1.5 mL and Marcaine 0.5% plain; 1.5 mL, digital block. Skin Prep: Betadine . Dressing: Silvadene; telfa; dry, sterile, compression dressing. Technique: Following skin prep,  the toe was exsanguinated and a tourniquet was secured at the base of the toe. The affected nail border was freed, split with a nail splitter, and excised. Chemical matrixectomy was then performed with phenol and irrigated out with alcohol. The tourniquet was then removed and sterile dressing applied. Disposition: Patient tolerated procedure well. Patient to return in 2 weeks for follow-up.   No follow-ups on file.

## 2024-10-31 NOTE — Patient Instructions (Signed)

## 2024-11-01 ENCOUNTER — Telehealth: Payer: Self-pay | Admitting: Family

## 2024-11-01 ENCOUNTER — Other Ambulatory Visit: Payer: Self-pay | Admitting: Family

## 2024-11-01 DIAGNOSIS — E1159 Type 2 diabetes mellitus with other circulatory complications: Secondary | ICD-10-CM

## 2024-11-01 NOTE — Telephone Encounter (Signed)
 Patient left VM stating she needs to be seen to have her sugar checked. States she went to the foot doctor and had some areas on her foot and he would like her to have her sugar checked. Please call patient to schedule appointment.

## 2024-11-02 ENCOUNTER — Other Ambulatory Visit: Payer: Self-pay | Admitting: Internal Medicine

## 2024-11-02 DIAGNOSIS — T82857D Stenosis of cardiac prosthetic devices, implants and grafts, subsequent encounter: Secondary | ICD-10-CM

## 2024-11-04 ENCOUNTER — Other Ambulatory Visit

## 2024-11-04 DIAGNOSIS — E1159 Type 2 diabetes mellitus with other circulatory complications: Secondary | ICD-10-CM

## 2024-11-05 ENCOUNTER — Other Ambulatory Visit: Payer: Self-pay

## 2024-11-05 ENCOUNTER — Ambulatory Visit: Payer: Self-pay

## 2024-11-05 LAB — HEMOGLOBIN A1C
Est. average glucose Bld gHb Est-mCnc: 160 mg/dL
Hgb A1c MFr Bld: 7.2 % — ABNORMAL HIGH (ref 4.8–5.6)

## 2024-11-05 MED ORDER — RYBELSUS 3 MG PO TABS: 3.0000 mg | ORAL_TABLET | Freq: Every day | ORAL | 0 refills | Status: AC

## 2024-11-05 MED ORDER — RYBELSUS 3 MG PO TABS: 3.0000 mg | ORAL_TABLET | Freq: Every day | ORAL | 0 refills | Status: DC

## 2024-11-05 MED ORDER — RYBELSUS 3 MG PO TABS
3.0000 mg | ORAL_TABLET | Freq: Every day | ORAL | 0 refills | Status: AC
Start: 2024-11-05 — End: ?

## 2024-11-11 ENCOUNTER — Ambulatory Visit: Admitting: Cardiovascular Disease

## 2024-11-12 ENCOUNTER — Ambulatory Visit: Payer: Self-pay

## 2024-11-12 ENCOUNTER — Ambulatory Visit: Admitting: Cardiovascular Disease

## 2024-11-13 ENCOUNTER — Ambulatory Visit: Admitting: Podiatry

## 2024-11-13 DIAGNOSIS — L600 Ingrowing nail: Secondary | ICD-10-CM | POA: Diagnosis not present

## 2024-11-13 MED ORDER — CEFADROXIL 500 MG PO CAPS: 500.0000 mg | ORAL_CAPSULE | Freq: Two times a day (BID) | ORAL | 0 refills | Status: AC

## 2024-11-14 NOTE — Progress Notes (Signed)
  Subjective:  Patient ID: Kayla Malone, female    DOB: 05-17-1961,  MRN: [id number]  Chief Complaint  Patient presents with   Routine Post Op    Ingrown removal 11/20 now having yellow pus, some swelling (Pt of Dr. Tobie). A1c in the 7's    DOS: 10/31/2024 Procedure: Left hallux matricectomy  63 y.o. female returns for post-op check.  She noticed continued pain and swelling and discharge and was concerned about infection due to her diabetes  Review of Systems: Negative except as noted in the HPI. Denies N/V/F/Ch.   Objective:  There were no vitals filed for this visit. There is no height or weight on file to calculate BMI. Constitutional Well developed. Well nourished.  Vascular Foot warm and well perfused. Capillary refill normal to all digits.  Calf is soft and supple, no posterior calf or knee pain, negative Homans' sign  Neurologic Normal speech. Oriented to person, place, and time. Epicritic sensation to light touch grossly present bilaterally.  Dermatologic Matricectomy site is healing well there is some fibrotic material no active drainage no cellulitis  Orthopedic: Tenderness to palpation noted about the surgical site.    Assessment:   1. Ingrown left big toenail    Plan:  Patient was evaluated and treated and all questions answered.  S/p foot surgery left - Matricectomy site debrided with a nail curette to remove nonviable tissue.  Appears to be healing fairly well I discussed with her likely most of the symptoms she is experiencing are secondary to the phenolic acid.  I prescribed her Duricef as a precaution.  Follow-up with Dr. Tobie as scheduled next week.  No follow-ups on file.

## 2024-11-18 ENCOUNTER — Ambulatory Visit: Admitting: Cardiovascular Disease

## 2024-11-20 ENCOUNTER — Other Ambulatory Visit: Payer: Self-pay | Admitting: Cardiology

## 2024-11-21 ENCOUNTER — Ambulatory Visit: Admitting: Podiatry

## 2024-11-26 ENCOUNTER — Ambulatory Visit: Admitting: Podiatry

## 2024-11-26 DIAGNOSIS — L600 Ingrowing nail: Secondary | ICD-10-CM | POA: Diagnosis not present

## 2024-11-26 NOTE — Progress Notes (Signed)
 Subjective: Kayla Malone is a 63 y.o.  female returns to office today for follow up evaluation after having left Hallux Medial border nail avulsion performed. Patient has been soaking using epsom salt and applying topical antibiotic covered with bandaid daily. Patient denies fevers, chills, nausea, vomiting. Denies any calf pain, chest pain, SOB.   Objective:  Vitals: Reviewed  General: Well developed, nourished, in no acute distress, alert and oriented x3   Dermatology: Skin is warm, dry and supple bilateral. Medial hallux nail border appears to be clean, dry, with mild granular tissue and surrounding scab. There is no surrounding erythema, edema, drainage/purulence. The remaining nails appear unremarkable at this time. There are no other lesions or other signs of infection present.  Neurovascular status: Intact. No lower extremity swelling; No pain with calf compression bilateral.  Musculoskeletal: Decreased tenderness to palpation of the Medial hallux nail fold(s). Muscular strength within normal limits bilateral.   Assesement and Plan: S/p partial nail avulsion, doing well.   -disContinue soaking in epsom salts twice a day followed by antibiotic ointment and a band-aid as needed. Can leave uncovered at night. .  -If the area does not heal properply, call the office for follow-up appointment, or sooner if any problems arise.  -Monitor for any signs/symptoms of infection. Call the office immediately if any occur or go directly to the emergency room. Call with any questions/concerns.  Karley Pho, DPM

## 2024-11-28 ENCOUNTER — Ambulatory Visit: Admitting: Cardiovascular Disease

## 2024-11-28 ENCOUNTER — Encounter: Payer: Self-pay | Admitting: Cardiovascular Disease

## 2024-11-28 VITALS — BP 109/65 | HR 58 | Ht 63.0 in | Wt 142.8 lb

## 2024-11-28 DIAGNOSIS — I739 Peripheral vascular disease, unspecified: Secondary | ICD-10-CM | POA: Diagnosis not present

## 2024-11-28 DIAGNOSIS — R0789 Other chest pain: Secondary | ICD-10-CM

## 2024-11-28 DIAGNOSIS — I69359 Hemiplegia and hemiparesis following cerebral infarction affecting unspecified side: Secondary | ICD-10-CM

## 2024-11-28 DIAGNOSIS — I25118 Atherosclerotic heart disease of native coronary artery with other forms of angina pectoris: Secondary | ICD-10-CM | POA: Diagnosis not present

## 2024-11-28 DIAGNOSIS — I5032 Chronic diastolic (congestive) heart failure: Secondary | ICD-10-CM

## 2024-11-28 DIAGNOSIS — R0602 Shortness of breath: Secondary | ICD-10-CM

## 2024-11-28 DIAGNOSIS — I10 Essential (primary) hypertension: Secondary | ICD-10-CM

## 2024-11-28 DIAGNOSIS — E782 Mixed hyperlipidemia: Secondary | ICD-10-CM

## 2024-11-28 DIAGNOSIS — T82857D Stenosis of cardiac prosthetic devices, implants and grafts, subsequent encounter: Secondary | ICD-10-CM

## 2024-11-28 NOTE — Addendum Note (Signed)
 Addended by: FERNAND ALTER A on: 11/28/2024 10:59 AM   Modules accepted: Orders

## 2024-11-28 NOTE — Progress Notes (Addendum)
 Cardiology Office Note   Date:  11/28/2024   ID:  Kayla Malone, DOB [birth year], MRN [id number]  PCP:  Orlean Alan HERO, FNP  Cardiologist:  Denyse Bathe, MD      History of Present Illness: Kayla Malone is a 63 y.o. female who presents for  Chief Complaint  Patient presents with   Follow-up    Follow up new medication    Has chest pain and SOB, and feels palpitations. Feels pressure in chest.      Past Medical History:  Diagnosis Date   Adenomatous colon polyp 02/20/2014   Asthma    Benign neoplasm of colon 02/20/2014   Chest pain 09/13/2013   Coronary angioplasty status 11/13/2018   Coronary artery disease    Depression    Diabetes mellitus without complication (HCC)    GERD (gastroesophageal reflux disease)    History of hiatal hernia    Hypertension    Hypokalemia 05/29/2019   Hypomagnesemia 07/02/2022   Nausea with vomiting, unspecified 06/07/2019   NSTEMI (non-ST elevated myocardial infarction) (HCC) 09/23/2018   Pain    BACK   Stroke (HCC)    x3   Thyroid  cyst    TIA (transient ischemic attack) 08/06/2017   Unstable angina (HCC) 10/20/2017   Weight loss 06/07/2019     Past Surgical History:  Procedure Laterality Date   ABDOMINAL HYSTERECTOMY     CARDIAC SURGERY     CATARACT EXTRACTION W/PHACO Left 08/15/2017   Procedure: CATARACT EXTRACTION PHACO AND INTRAOCULAR LENS PLACEMENT (IOC);  Surgeon: Jaye Fallow, MD;  Location: [facility] ORS;  Service: Ophthalmology;  Laterality: Left;  US  00:40.6 AP% 7.3 CDE 2.98 FLUID PACK LOT # [id number]   CORONARY ARTERY BYPASS GRAFT N/A 10/23/2017   Procedure: CORONARY ARTERY BYPASS GRAFTING (CABG) x two  , using left internal mammary artery and right leg greater saphenous vein harvested  endoscopically - LIMA to LAD, SVG to RCA;  Surgeon: Army Dallas NOVAK, MD;  Location: [facility] OR;  Service: Open Heart Surgery;  Laterality: N/A;   CORONARY STENT INTERVENTION N/A 09/24/2018   Procedure: CORONARY  STENT INTERVENTION;  Surgeon: Dann Candyce RAMAN, MD;  Location: [facility] INVASIVE CV LAB;  Service: Cardiovascular;  Laterality: N/A;   ESOPHAGOGASTRODUODENOSCOPY N/A 05/30/2019   Procedure: ESOPHAGOGASTRODUODENOSCOPY (EGD);  Surgeon: Toledo, Ladell POUR, MD;  Location: [facility] ENDOSCOPY;  Service: Gastroenterology;  Laterality: N/A;   EYE SURGERY     LEFT HEART CATH AND CORONARY ANGIOGRAPHY Left 10/09/2017   Procedure: LEFT HEART CATH AND CORONARY ANGIOGRAPHY;  Surgeon: Bathe Denyse LABOR, MD;  Location: [facility] INVASIVE CV LAB;  Service: Cardiovascular;  Laterality: Left;   LEFT HEART CATH AND CORS/GRAFTS ANGIOGRAPHY N/A 09/24/2018   Procedure: LEFT HEART CATH AND CORS/GRAFTS ANGIOGRAPHY;  Surgeon: Dann Candyce RAMAN, MD;  Location: [facility] INVASIVE CV LAB;  Service: Cardiovascular;  Laterality: N/A;   LEFT HEART CATH AND CORS/GRAFTS ANGIOGRAPHY N/A 07/04/2022   Procedure: LEFT HEART CATH AND CORS/GRAFTS ANGIOGRAPHY;  Surgeon: Bathe Denyse LABOR, MD;  Location: [facility] INVASIVE CV LAB;  Service: Cardiovascular;  Laterality: N/A;   TEE WITHOUT CARDIOVERSION N/A 10/23/2017   Procedure: TRANSESOPHAGEAL ECHOCARDIOGRAM (TEE);  Surgeon: Army Dallas NOVAK, MD;  Location: [facility] OR;  Service: Open Heart Surgery;  Laterality: N/A;     Current Outpatient Medications  Medication Sig Dispense Refill   Accu-Chek Softclix Lancets lancets USE TO TEST SUGARS TWICE DAILY 200 each 3   acetaminophen  (TYLENOL ) 500 MG tablet Take 1,000 mg by mouth every 6 (six) hours as  needed for mild pain or headache.     aspirin  EC 81 MG tablet Take 81 mg by mouth daily.     atorvastatin  (LIPITOR ) 80 MG tablet TAKE 1 TABLET EVERY DAY 90 tablet 3   Blood Glucose Monitoring Suppl (ACCU-CHEK GUIDE ME) w/Device KIT      chlorthalidone (HYGROTON) 25 MG tablet TAKE 1 TABLET EVERY DAY 90 tablet 3   clopidogrel  (PLAVIX ) 75 MG tablet TAKE 1 TABLET EVERY DAY 90 tablet 3   Evolocumab  (REPATHA  SURECLICK) 140 MG/ML SOAJ Inject 140 mg into the skin every 14  (fourteen) days. 2 mL 2   fluticasone  (FLONASE ) 50 MCG/ACT nasal spray USE 2 SPRAYS IN EACH NOSTRIL EVERY DAY 48 g 3   glucose blood (ACCU-CHEK GUIDE) test strip Blood Glucose Test In Vitro Strip QTY: 100 strip Days: 90 Refills: 3  Written: 06/07/19 Patient Instructions: E11.9 - to check blodd sugars once daily     isosorbide  mononitrate (IMDUR ) 30 MG 24 hr tablet TAKE 1 TABLET EVERY DAY 90 tablet 3   losartan  (COZAAR ) 25 MG tablet TAKE 1 TABLET EVERY DAY 90 tablet 3   methocarbamol  (ROBAXIN ) 500 MG tablet Take 500 mg by mouth at bedtime as needed for muscle spasms.     metoprolol  succinate (TOPROL  XL) 25 MG 24 hr tablet Take 1 tablet (25 mg total) by mouth daily. 30 tablet 11   nitroGLYCERIN  (NITROSTAT ) 0.4 MG SL tablet Place 1 tablet (0.4 mg total) under the tongue every 5 (five) minutes x 3 doses as needed for chest pain. 25 tablet 0   ondansetron  (ZOFRAN -ODT) 4 MG disintegrating tablet DISSOLVE 1 TABLET IN MOUTH UP TO EVERY 8 HOURS AS NEEDED FOR NAUSEA 90 tablet 3   pantoprazole  (PROTONIX ) 40 MG tablet TAKE 1 TABLET BY MOUTH EVERY MORNING 30 MINS BEFORE FOOD 90 tablet 3   potassium chloride  SA (KLOR-CON  M) 20 MEQ tablet TAKE 2 TABLETS BY MOUTH DAILY 180 tablet 0   ranolazine (RANEXA) 500 MG 12 hr tablet TAKE 1 TABLET TWICE DAILY 180 tablet 3   Semaglutide  (RYBELSUS ) 3 MG TABS Take 1 tablet (3 mg total) by mouth daily. 30 tablet 0   Semaglutide  (RYBELSUS ) 3 MG TABS Take 1 tablet (3 mg total) by mouth daily. 90 tablet 0   sucralfate  (CARAFATE ) 1 g tablet TAKE 1 TABLET BY MOUTH 4 TIMES DAILY. 360 tablet 1   No current facility-administered medications for this visit.    Allergies:   Lisinopril, Meloxicam , Tramadol, Amoxicillin, Amoxicillin-pot clavulanate, and Clavulanic acid    Social History:   reports that she quit smoking about 26 years ago. Her smoking use included cigarettes. She has never used smokeless tobacco. She reports that she does not drink alcohol and does not use drugs.    Family History:  family history includes COPD in her brother; Cirrhosis in her father; Coronary artery disease in her brother, father, and sister; Diabetes type II in her father; Heart failure in her mother; Hypertension in her father; Kidney disease in her father; Pulmonary embolism in her mother; Stroke in her sister.    ROS:     Review of Systems  Constitutional: Negative.   HENT: Negative.    Eyes: Negative.   Respiratory: Negative.    Gastrointestinal: Negative.   Genitourinary: Negative.   Musculoskeletal: Negative.   Skin: Negative.   Neurological: Negative.   Endo/Heme/Allergies: Negative.   Psychiatric/Behavioral: Negative.    All other systems reviewed and are negative.     All other systems are reviewed  and negative.    PHYSICAL EXAM: VS:  BP 109/65   Pulse (!) 58   Ht 5' 3 (1.6 m)   Wt 142 lb 12.8 oz (64.8 kg)   SpO2 99%   BMI 25.30 kg/m  , BMI Body mass index is 25.3 kg/m. Last weight:  Wt Readings from Last 3 Encounters:  11/28/24 142 lb 12.8 oz (64.8 kg)  10/21/24 140 lb 9.6 oz (63.8 kg)  09/27/24 129 lb (58.5 kg)     Physical Exam Constitutional:      Appearance: Normal appearance.  Cardiovascular:     Rate and Rhythm: Normal rate and regular rhythm.     Heart sounds: Normal heart sounds.  Pulmonary:     Effort: Pulmonary effort is normal.     Breath sounds: Normal breath sounds.  Musculoskeletal:     Right lower leg: No edema.     Left lower leg: No edema.  Neurological:     Mental Status: She is alert.       EKG:   Recent Labs: 09/12/2024: ALT 9; TSH 0.898 09/27/2024: BUN 18; Creatinine, Ser 1.04; Hemoglobin 12.4; Platelets 318; Potassium 3.7; Sodium 138    Lipid Panel    Component Value Date/Time   CHOL 145 09/12/2024 1022   TRIG 53 09/12/2024 1022   HDL 55 09/12/2024 1022   CHOLHDL 2.6 09/12/2024 1022   CHOLHDL 4.0 07/04/2022 0623   VLDL 14 07/04/2022 0623   LDLCALC 79 09/12/2024 1022      Other studies  Reviewed: Additional studies/ records that were reviewed today include:  Review of the above records demonstrates:       No data to display            ASSESSMENT AND PLAN:    ICD-10-CM   1. Other chest pain  R07.89 Comprehensive metabolic panel    Lipid Profile   has stable angina.  change imdur  bid    2. Benign essential hypertension  I10 Comprehensive metabolic panel    Lipid Profile    3. Chronic diastolic (congestive) heart failure (HCC)  I50.32 Comprehensive metabolic panel    Lipid Profile    4. Peripheral vascular disease  I73.9 Comprehensive metabolic panel    Lipid Profile    5. Coronary artery disease of native artery of native heart with stable angina pectoris  I25.118 Comprehensive metabolic panel    Lipid Profile    6. Mixed hyperlipidemia  E78.2 Comprehensive metabolic panel    Lipid Profile   on repatha , check lab    7. CVA, old, hemiparesis (HCC)  I69.359 Comprehensive metabolic panel    Lipid Profile    8. Coronary graft stenosis, subsequent encounter  T82.857D Comprehensive metabolic panel    Lipid Profile    9. SOB (shortness of breath)  R06.02 Comprehensive metabolic panel    Lipid Profile       Problem List Items Addressed This Visit       Cardiovascular and Mediastinum   Benign essential hypertension   Relevant Orders   Comprehensive metabolic panel   Lipid Profile   Coronary artery disease of native artery of native heart with stable angina pectoris   Relevant Orders   Comprehensive metabolic panel   Lipid Profile   Coronary graft stenosis   Relevant Orders   Comprehensive metabolic panel   Lipid Profile   Peripheral vascular disease   Relevant Orders   Comprehensive metabolic panel   Lipid Profile   Chronic diastolic (congestive) heart failure (HCC)  Relevant Orders   Comprehensive metabolic panel   Lipid Profile     Nervous and Auditory   CVA, old, hemiparesis (HCC)   Relevant Orders   Comprehensive metabolic panel    Lipid Profile     Other   Chest pain - Primary   Relevant Orders   Comprehensive metabolic panel   Lipid Profile   Mixed hyperlipidemia   Relevant Orders   Comprehensive metabolic panel   Lipid Profile   Other Visit Diagnoses       SOB (shortness of breath)       Relevant Orders   Comprehensive metabolic panel   Lipid Profile          Disposition:   Return in about 2 months (around 01/29/2025).    Total time spent: 35 minutes  Signed,  Denyse Bathe, MD  11/28/2024 10:59 AM    [facility]

## 2024-11-29 LAB — COMPREHENSIVE METABOLIC PANEL WITH GFR
ALT: 10 IU/L (ref 0–32)
AST: 14 IU/L (ref 0–40)
Albumin: 4.3 g/dL (ref 3.9–4.9)
Alkaline Phosphatase: 93 IU/L (ref 49–135)
BUN/Creatinine Ratio: 11 — ABNORMAL LOW (ref 12–28)
BUN: 12 mg/dL (ref 8–27)
Bilirubin Total: 0.8 mg/dL (ref 0.0–1.2)
CO2: 26 mmol/L (ref 20–29)
Calcium: 9.3 mg/dL (ref 8.7–10.3)
Chloride: 97 mmol/L (ref 96–106)
Creatinine, Ser: 1.12 mg/dL — ABNORMAL HIGH (ref 0.57–1.00)
Globulin, Total: 2 g/dL (ref 1.5–4.5)
Glucose: 115 mg/dL — ABNORMAL HIGH (ref 70–99)
Potassium: 3.1 mmol/L — ABNORMAL LOW (ref 3.5–5.2)
Sodium: 140 mmol/L (ref 134–144)
Total Protein: 6.3 g/dL (ref 6.0–8.5)
eGFR: 55 mL/min/1.73 — ABNORMAL LOW

## 2024-11-29 LAB — LIPID PANEL
Chol/HDL Ratio: 2 ratio (ref 0.0–4.4)
Cholesterol, Total: 102 mg/dL (ref 100–199)
HDL: 51 mg/dL
LDL Chol Calc (NIH): 39 mg/dL (ref 0–99)
Triglycerides: 45 mg/dL (ref 0–149)
VLDL Cholesterol Cal: 12 mg/dL (ref 5–40)

## 2024-12-02 ENCOUNTER — Ambulatory Visit: Payer: Self-pay

## 2024-12-02 ENCOUNTER — Ambulatory Visit: Admitting: Family

## 2024-12-02 DIAGNOSIS — J069 Acute upper respiratory infection, unspecified: Secondary | ICD-10-CM

## 2024-12-02 LAB — POCT XPERT XPRESS SARS COVID-2/FLU/RSV
FLU A: POSITIVE
FLU B: NEGATIVE
RSV RNA, PCR: NEGATIVE
SARS Coronavirus 2: NEGATIVE

## 2024-12-02 MED ORDER — OSELTAMIVIR PHOSPHATE 75 MG PO CAPS: 75.0000 mg | ORAL_CAPSULE | Freq: Two times a day (BID) | ORAL | 0 refills | Status: AC

## 2024-12-02 NOTE — Progress Notes (Signed)
 CHIEF COMPLAINT  PCR Testing      REASON FOR VISIT  PCR Test Only     ASSESSMENT  Contact with and (suspected) exposure to other viral communicable disease     PLAN  COVID/FluA+B/RSV completed in the office today  COVID Negative Flu A  Positive Flu B Negative RSV Negative   Tamiflu  sent in for pt.

## 2024-12-11 ENCOUNTER — Other Ambulatory Visit: Payer: Self-pay | Admitting: Cardiovascular Disease

## 2024-12-17 ENCOUNTER — Encounter: Payer: Self-pay | Admitting: Family

## 2024-12-17 ENCOUNTER — Ambulatory Visit: Admitting: Family

## 2024-12-17 ENCOUNTER — Telehealth: Payer: Self-pay

## 2024-12-17 VITALS — BP 112/68 | HR 73 | Ht 63.0 in | Wt 141.2 lb

## 2024-12-17 DIAGNOSIS — N6315 Unspecified lump in the right breast, overlapping quadrants: Secondary | ICD-10-CM

## 2024-12-17 DIAGNOSIS — I5032 Chronic diastolic (congestive) heart failure: Secondary | ICD-10-CM

## 2024-12-17 DIAGNOSIS — I69359 Hemiplegia and hemiparesis following cerebral infarction affecting unspecified side: Secondary | ICD-10-CM

## 2024-12-17 DIAGNOSIS — E1159 Type 2 diabetes mellitus with other circulatory complications: Secondary | ICD-10-CM

## 2024-12-17 LAB — POCT CBG (FASTING - GLUCOSE)-MANUAL ENTRY: Glucose Fasting, POC: 124 mg/dL — AB (ref 70–99)

## 2024-12-17 NOTE — Telephone Encounter (Signed)
 Pt asked about continuing the Repatha  injection and per shaukat yes she needs to continue, the original message was sent in the wrong patients chart

## 2024-12-17 NOTE — Progress Notes (Unsigned)
 "   Annual Wellness Visit  Patient: Kayla Malone, Female    DOB: [birth year], 64 y.o.   MRN: [id number] Visit Date: 12/17/2024  Today's Provider: ALAN CHRISTELLA ARRANT, FNP  Subjective:    Chief Complaint  Patient presents with   Follow-up    3 month follow up   Kayla Malone is a 64 y.o. female who presents today for her Annual Wellness Visit.  HPI  Past Medical History:  Diagnosis Date   Adenomatous colon polyp 02/20/2014   Asthma    Benign neoplasm of colon 02/20/2014   Chest pain 09/13/2013   Coronary angioplasty status 11/13/2018   Coronary artery disease    Depression    Diabetes mellitus without complication (HCC)    GERD (gastroesophageal reflux disease)    History of hiatal hernia    Hypertension    Hypokalemia 05/29/2019   Hypomagnesemia 07/02/2022   Nausea with vomiting, unspecified 06/07/2019   NSTEMI (non-ST elevated myocardial infarction) (HCC) 09/23/2018   Pain    BACK   Stroke (HCC)    x3   Thyroid  cyst    TIA (transient ischemic attack) 08/06/2017   Unstable angina (HCC) 10/20/2017   Weight loss 06/07/2019   Past Surgical History:  Procedure Laterality Date   ABDOMINAL HYSTERECTOMY     CARDIAC SURGERY     CATARACT EXTRACTION W/PHACO Left 08/15/2017   Procedure: CATARACT EXTRACTION PHACO AND INTRAOCULAR LENS PLACEMENT (IOC);  Surgeon: Jaye Fallow, MD;  Location: [facility] ORS;  Service: Ophthalmology;  Laterality: Left;  US  00:40.6 AP% 7.3 CDE 2.98 FLUID PACK LOT # [id number]   CORONARY ARTERY BYPASS GRAFT N/A 10/23/2017   Procedure: CORONARY ARTERY BYPASS GRAFTING (CABG) x two  , using left internal mammary artery and right leg greater saphenous vein harvested  endoscopically - LIMA to LAD, SVG to RCA;  Surgeon: Army Dallas NOVAK, MD;  Location: [facility] OR;  Service: Open Heart Surgery;  Laterality: N/A;   CORONARY STENT INTERVENTION N/A 09/24/2018   Procedure: CORONARY STENT INTERVENTION;  Surgeon: Dann Candyce RAMAN, MD;  Location: [facility]  INVASIVE CV LAB;  Service: Cardiovascular;  Laterality: N/A;   ESOPHAGOGASTRODUODENOSCOPY N/A 05/30/2019   Procedure: ESOPHAGOGASTRODUODENOSCOPY (EGD);  Surgeon: Toledo, Ladell POUR, MD;  Location: [facility] ENDOSCOPY;  Service: Gastroenterology;  Laterality: N/A;   EYE SURGERY     LEFT HEART CATH AND CORONARY ANGIOGRAPHY Left 10/09/2017   Procedure: LEFT HEART CATH AND CORONARY ANGIOGRAPHY;  Surgeon: Fernand Denyse LABOR, MD;  Location: [facility] INVASIVE CV LAB;  Service: Cardiovascular;  Laterality: Left;   LEFT HEART CATH AND CORS/GRAFTS ANGIOGRAPHY N/A 09/24/2018   Procedure: LEFT HEART CATH AND CORS/GRAFTS ANGIOGRAPHY;  Surgeon: Dann Candyce RAMAN, MD;  Location: [facility] INVASIVE CV LAB;  Service: Cardiovascular;  Laterality: N/A;   LEFT HEART CATH AND CORS/GRAFTS ANGIOGRAPHY N/A 07/04/2022   Procedure: LEFT HEART CATH AND CORS/GRAFTS ANGIOGRAPHY;  Surgeon: Fernand Denyse LABOR, MD;  Location: [facility] INVASIVE CV LAB;  Service: Cardiovascular;  Laterality: N/A;   TEE WITHOUT CARDIOVERSION N/A 10/23/2017   Procedure: TRANSESOPHAGEAL ECHOCARDIOGRAM (TEE);  Surgeon: Army Dallas NOVAK, MD;  Location: [facility] OR;  Service: Open Heart Surgery;  Laterality: N/A;   Family History  Problem Relation Age of Onset   Heart failure Mother    Pulmonary embolism Mother    Cirrhosis Father    Coronary artery disease Father    Diabetes type II Father    Hypertension Father    Kidney disease Father    Coronary artery disease Sister    Stroke  Sister    COPD Brother    Coronary artery disease Brother    Breast cancer Neg Hx    Social History   Socioeconomic History   Marital status: Married    Spouse name: Not on file   Number of children: Not on file   Years of education: Not on file   Highest education level: Not on file  Occupational History   Not on file  Tobacco Use   Smoking status: Former    Current packs/day: 0.00    Types: Cigarettes    Quit date: 09/13/1998    Years since quitting: 26.2   Smokeless tobacco:  Never  Vaping Use   Vaping status: Never Used  Substance and Sexual Activity   Alcohol use: No   Drug use: No   Sexual activity: Yes    Birth control/protection: Post-menopausal  Other Topics Concern   Not on file  Social History Narrative   Not on file   Social Drivers of Health   Tobacco Use: Medium Risk (12/17/2024)   Patient History    Smoking Tobacco Use: Former    Smokeless Tobacco Use: Never    Passive Exposure: Not on Actuary Strain: Low Risk (12/10/2023)   Overall Financial Resource Strain (CARDIA)    Difficulty of Paying Living Expenses: Not hard at all  Food Insecurity: No Food Insecurity (11/30/2023)   Hunger Vital Sign    Worried About Running Out of Food in the Last Year: Never true    Ran Out of Food in the Last Year: Never true  Transportation Needs: No Transportation Needs (12/10/2023)   PRAPARE - Administrator, Civil Service (Medical): No    Lack of Transportation (Non-Medical): No  Physical Activity: Insufficiently Active (11/30/2023)   Exercise Vital Sign    Days of Exercise per Week: 5 days    Minutes of Exercise per Session: 10 min  Stress: No Stress Concern Present (11/30/2023)   [employer] of Occupational Health - Occupational Stress Questionnaire    Feeling of Stress : Not at all  Social Connections: Moderately Isolated (12/10/2023)   Social Connection and Isolation Panel    Frequency of Communication with Friends and Family: More than three times a week    Frequency of Social Gatherings with Friends and Family: More than three times a week    Attends Religious Services: More than 4 times per year    Active Member of [employer] or Organizations: No    Attends Banker Meetings: Never    Marital Status: Widowed  Intimate Partner Violence: Not At Risk (11/30/2023)   Humiliation, Afraid, Rape, and Kick questionnaire    Fear of Current or Ex-Partner: No    Emotionally Abused: No    Physically Abused: No     Sexually Abused: No  Depression (PHQ2-9): High Risk (11/13/2023)   Depression (PHQ2-9)    PHQ-2 Score: 17  Alcohol Screen: Low Risk (11/30/2023)   Alcohol Screen    Last Alcohol Screening Score (AUDIT): 0  Housing: Low Risk (11/30/2023)   Housing Stability Vital Sign    Unable to Pay for Housing in the Last Year: No    Number of Times Moved in the Last Year: 0    Homeless in the Last Year: No  Utilities: Not At Risk (11/30/2023)   AHC Utilities    Threatened with loss of utilities: No  Health Literacy: Adequate Health Literacy (12/10/2023)   B1300 Health Literacy    Frequency of  need for help with medical instructions: Never    Medications: Show/hide medication list[1]  Allergies[2]  Patient Care Team: Orlean Alan HERO, FNP as PCP - General (Family Medicine) Fernand Denyse LABOR, MD as PCP - Cardiology (Cardiology) Fernand Denyse LABOR, MD as Consulting Physician (Cardiology)  Review of Systems  {Show previous labs (optional):23779}   Objective:    Vitals: BP 112/68   Pulse 73   Ht 5' 3 (1.6 m)   Wt 141 lb 3.2 oz (64 kg)   SpO2 99%   BMI 25.01 kg/m  {Show previous vitals signs (optional):23777}  Physical Exam ***  Most recent functional status assessment:     No data to display          Most recent fall risk assessment:    12/10/2023    4:57 PM  Fall Risk   Falls in the past year? 1  Number falls in past yr: 1  Injury with Fall? 0   Risk for fall due to : History of fall(s);Impaired mobility;Impaired balance/gait;Orthopedic patient  Follow up Falls evaluation completed;Education provided;Falls prevention discussed;Follow up appointment     Data saved with a previous flowsheet row definition     Most recent depression screenings:    11/13/2023   10:42 AM 10/11/2018    1:43 PM  PHQ 2/9 Scores  PHQ - 2 Score 4 6  PHQ- 9 Score 17  17      Data saved with a previous flowsheet row definition    Most recent cognitive screening:    12/10/2023    4:57  PM  6CIT Screen  What Year? 0 points  What month? 0 points  What time? 0 points  Count back from 20 0 points  Months in reverse 0 points  Repeat phrase 0 points  Total Score 0 points    Results for orders placed or performed in visit on 12/17/24  POCT CBG (Fasting - Glucose)  Result Value Ref Range   Glucose Fasting, POC 124 (A) 70 - 99 mg/dL       Assessment & Plan:      Annual wellness visit done today including the all of the following: Reviewed patient's Family Medical History Reviewed and updated list of patient's medical providers Assessment of cognitive impairment was done Assessed patient's functional ability Established a written schedule for health screening services Health Risk Assessent Completed and Reviewed  Exercise Activities and Dietary recommendations  Goals      Activity and Exercise Increased     Evidence-based guidance:  Review current exercise levels.  Assess patient perspective on exercise or activity level, barriers to increasing activity, motivation and readiness for change.  Recommend or set healthy exercise goal based on individual tolerance.  Encourage small steps toward making change in amount of exercise or activity.  Urge reduction of sedentary activities or screen time.  Promote group activities within the community or with family or support person.  Consider referral to rehabiliation therapist for assessment and exercise/activity plan.   Notes:      Healthy Nutrition Achieved     Evidence-based guidance:  Assess patient perspective on healthy weight, weight loss or weight gain, motivation and readiness for change.  Recommend or set healthy weight goal based on body mass index.  Review current dietary intake and exercise levels.  Encourage small steps toward making change to eating and exercising.  Provide individualized medical nutrition therapy.   Notes:         Immunization History  Administered Date(s) Administered  Influenza Inj Mdck Quad Pf 09/23/2021   Influenza, Mdck, Trivalent,PF 6+ MOS(egg free) 09/15/2023, 09/12/2024   Influenza,inj,quad, With Preservative 01/12/2017   Influenza-Unspecified 06/17/2019   PFIZER(Purple Top)SARS-COV-2 Vaccination 04/05/2020, 04/26/2020   Pneumococcal Polysaccharide-23 09/29/2017   Tdap 06/18/2020, 06/10/2024    Health Maintenance  Topic Date Due   Zoster Vaccines- Shingrix (1 of 2) Never done   Pneumococcal Vaccine: 50+ Years (2 of 2 - PCV) 09/29/2018   OPHTHALMOLOGY EXAM  11/03/2023   COVID-19 Vaccine (3 - 2025-26 season) 08/12/2024   FOOT EXAM  10/02/2024   Medicare Annual Wellness (AWV)  11/29/2024   HEMOGLOBIN A1C  05/04/2025   Diabetic kidney evaluation - Urine ACR  06/12/2025   Diabetic kidney evaluation - eGFR measurement  11/28/2025   Mammogram  12/21/2025   Colonoscopy  07/11/2028   DTaP/Tdap/Td (3 - Td or Tdap) 06/10/2034   Influenza Vaccine  Completed   Hepatitis C Screening  Completed   HIV Screening  Completed   Hepatitis B Vaccines 19-59 Average Risk  Aged Out   HPV VACCINES  Aged Out   Meningococcal B Vaccine  Aged Out     Discussed health benefits of physical activity, and encouraged her to engage in regular exercise appropriate for her age and condition.    ***     Garritt Molyneux CHRISTELLA ARRANT, FNP   12/17/2024  This document may have been prepared by Dragon Voice Recognition software and as such may include unintentional dictation errors.      [1]  Outpatient Medications Prior to Visit  Medication Sig   Accu-Chek Softclix Lancets lancets USE TO TEST SUGARS TWICE DAILY   acetaminophen  (TYLENOL ) 500 MG tablet Take 1,000 mg by mouth every 6 (six) hours as needed for mild pain or headache.   aspirin  EC 81 MG tablet Take 81 mg by mouth daily.   atorvastatin  (LIPITOR ) 80 MG tablet TAKE 1 TABLET EVERY DAY   Blood Glucose Monitoring Suppl (ACCU-CHEK GUIDE ME) w/Device KIT    chlorthalidone (HYGROTON) 25 MG tablet TAKE 1 TABLET EVERY DAY    clopidogrel  (PLAVIX ) 75 MG tablet TAKE 1 TABLET EVERY DAY   Evolocumab  (REPATHA  SURECLICK) 140 MG/ML SOAJ Inject 140 mg into the skin every 14 (fourteen) days.   fluticasone  (FLONASE ) 50 MCG/ACT nasal spray USE 2 SPRAYS IN EACH NOSTRIL EVERY DAY   glucose blood (ACCU-CHEK GUIDE) test strip Blood Glucose Test In Vitro Strip QTY: 100 strip Days: 90 Refills: 3  Written: 06/07/19 Patient Instructions: E11.9 - to check blodd sugars once daily   isosorbide  mononitrate (IMDUR ) 30 MG 24 hr tablet TAKE 1 TABLET EVERY DAY   losartan  (COZAAR ) 25 MG tablet TAKE 1 TABLET EVERY DAY   methocarbamol  (ROBAXIN ) 500 MG tablet Take 500 mg by mouth at bedtime as needed for muscle spasms.   metoprolol  succinate (TOPROL  XL) 25 MG 24 hr tablet Take 1 tablet (25 mg total) by mouth daily.   nitroGLYCERIN  (NITROSTAT ) 0.4 MG SL tablet Place 1 tablet (0.4 mg total) under the tongue every 5 (five) minutes x 3 doses as needed for chest pain.   ondansetron  (ZOFRAN -ODT) 4 MG disintegrating tablet DISSOLVE 1 TABLET IN MOUTH UP TO EVERY 8 HOURS AS NEEDED FOR NAUSEA   pantoprazole  (PROTONIX ) 40 MG tablet TAKE 1 TABLET BY MOUTH EVERY MORNING 30 MINS BEFORE FOOD   potassium chloride  SA (KLOR-CON  M) 20 MEQ tablet TAKE 2 TABLETS BY MOUTH DAILY   ranolazine (RANEXA) 500 MG 12 hr tablet TAKE 1 TABLET TWICE DAILY   Semaglutide  (  RYBELSUS ) 3 MG TABS Take 1 tablet (3 mg total) by mouth daily.   sucralfate  (CARAFATE ) 1 g tablet TAKE 1 TABLET BY MOUTH 4 TIMES DAILY.   No facility-administered medications prior to visit.  [2]  Allergies Allergen Reactions   Lisinopril Swelling and Shortness Of Breath   Meloxicam  Nausea Only and Nausea And Vomiting   Tramadol Nausea Only and Other (See Comments)    sleepiness  Other reaction(s): Dizziness, Other, Other (See Comments)  somnolence  Other reaction(s): Other (See Comments), Other (See Comments)  Would not wake up after taking this medication   Amoxicillin Rash and Dermatitis    Don't  remember   Don't remember   Amoxicillin-Pot Clavulanate Hives, Rash and Dermatitis   Clavulanic Acid Rash   "

## 2024-12-20 ENCOUNTER — Ambulatory Visit

## 2024-12-27 ENCOUNTER — Ambulatory Visit
Admission: RE | Admit: 2024-12-27 | Discharge: 2024-12-27 | Disposition: A | Discharge status: Home / Self Care | Source: Ambulatory Visit | Attending: Family | Admitting: Family

## 2024-12-27 DIAGNOSIS — N6315 Unspecified lump in the right breast, overlapping quadrants: Secondary | ICD-10-CM | POA: Diagnosis present

## 2025-01-16 ENCOUNTER — Other Ambulatory Visit: Payer: Self-pay | Admitting: Cardiovascular Disease

## 2025-01-30 ENCOUNTER — Ambulatory Visit: Admitting: Cardiovascular Disease

## 2025-02-14 ENCOUNTER — Ambulatory Visit: Admitting: Family
